# Patient Record
Sex: Male | Born: 1965 | Race: Black or African American | Hispanic: No | Marital: Single | Smoking: Current every day smoker
Health system: Southern US, Community
[De-identification: ages and names within clinical notes are randomized; demographics above are authoritative.]

## PROBLEM LIST (undated history)

## (undated) DIAGNOSIS — I428 Other cardiomyopathies: Secondary | ICD-10-CM

## (undated) DIAGNOSIS — I480 Paroxysmal atrial fibrillation: Secondary | ICD-10-CM

## (undated) DIAGNOSIS — G4733 Obstructive sleep apnea (adult) (pediatric): Secondary | ICD-10-CM

## (undated) DIAGNOSIS — M1990 Unspecified osteoarthritis, unspecified site: Secondary | ICD-10-CM

## (undated) DIAGNOSIS — N183 Chronic kidney disease, stage 3 unspecified: Secondary | ICD-10-CM

## (undated) DIAGNOSIS — K219 Gastro-esophageal reflux disease without esophagitis: Secondary | ICD-10-CM

## (undated) DIAGNOSIS — F17200 Nicotine dependence, unspecified, uncomplicated: Secondary | ICD-10-CM

## (undated) DIAGNOSIS — K259 Gastric ulcer, unspecified as acute or chronic, without hemorrhage or perforation: Secondary | ICD-10-CM

## (undated) DIAGNOSIS — R7989 Other specified abnormal findings of blood chemistry: Secondary | ICD-10-CM

## (undated) DIAGNOSIS — I4892 Unspecified atrial flutter: Secondary | ICD-10-CM

## (undated) DIAGNOSIS — J309 Allergic rhinitis, unspecified: Secondary | ICD-10-CM

## (undated) DIAGNOSIS — Z95810 Presence of automatic (implantable) cardiac defibrillator: Secondary | ICD-10-CM

## (undated) DIAGNOSIS — R300 Dysuria: Secondary | ICD-10-CM

## (undated) DIAGNOSIS — E119 Type 2 diabetes mellitus without complications: Secondary | ICD-10-CM

## (undated) DIAGNOSIS — G4700 Insomnia, unspecified: Secondary | ICD-10-CM

## (undated) DIAGNOSIS — I472 Ventricular tachycardia, unspecified: Secondary | ICD-10-CM

## (undated) DIAGNOSIS — I4729 Other ventricular tachycardia: Secondary | ICD-10-CM

## (undated) DIAGNOSIS — I10 Essential (primary) hypertension: Secondary | ICD-10-CM

## (undated) DIAGNOSIS — E785 Hyperlipidemia, unspecified: Secondary | ICD-10-CM

## (undated) DIAGNOSIS — I5042 Chronic combined systolic (congestive) and diastolic (congestive) heart failure: Secondary | ICD-10-CM

## (undated) HISTORY — DX: Allergic rhinitis, unspecified: J30.9

## (undated) HISTORY — DX: Essential (primary) hypertension: I10

## (undated) HISTORY — DX: Morbid (severe) obesity due to excess calories: E66.01

## (undated) HISTORY — DX: Gastric ulcer, unspecified as acute or chronic, without hemorrhage or perforation: K25.9

## (undated) HISTORY — DX: Gastro-esophageal reflux disease without esophagitis: K21.9

## (undated) HISTORY — DX: Unspecified osteoarthritis, unspecified site: M19.90

## (undated) HISTORY — DX: Insomnia, unspecified: G47.00

## (undated) HISTORY — DX: Nicotine dependence, unspecified, uncomplicated: F17.200

## (undated) HISTORY — DX: Hyperlipidemia, unspecified: E78.5

## (undated) HISTORY — DX: Obstructive sleep apnea (adult) (pediatric): G47.33

## (undated) HISTORY — DX: Other cardiomyopathies: I42.8

## (undated) HISTORY — DX: Other specified abnormal findings of blood chemistry: R79.89

## (undated) HISTORY — DX: Type 2 diabetes mellitus without complications: E11.9

## (undated) HISTORY — DX: Dysuria: R30.0

## (undated) HISTORY — DX: Chronic kidney disease, stage 3 unspecified: N18.30

## (undated) HISTORY — DX: Chronic kidney disease, stage 3 (moderate): N18.3

---

## 1999-03-19 ENCOUNTER — Emergency Department (HOSPITAL_COMMUNITY): Admission: EM | Admit: 1999-03-19 | Discharge: 1999-03-19 | Payer: Self-pay | Admitting: Emergency Medicine

## 1999-03-26 ENCOUNTER — Encounter: Admission: RE | Admit: 1999-03-26 | Discharge: 1999-03-26 | Payer: Self-pay | Admitting: Internal Medicine

## 1999-09-19 ENCOUNTER — Emergency Department (HOSPITAL_COMMUNITY): Admission: EM | Admit: 1999-09-19 | Discharge: 1999-09-19 | Payer: Self-pay | Admitting: *Deleted

## 1999-09-23 ENCOUNTER — Encounter: Admission: RE | Admit: 1999-09-23 | Discharge: 1999-09-23 | Payer: Self-pay | Admitting: Internal Medicine

## 1999-10-24 ENCOUNTER — Encounter: Admission: RE | Admit: 1999-10-24 | Discharge: 1999-10-24 | Payer: Self-pay | Admitting: Internal Medicine

## 2000-08-28 ENCOUNTER — Encounter: Admission: RE | Admit: 2000-08-28 | Discharge: 2000-08-28 | Payer: Self-pay | Admitting: Internal Medicine

## 2000-10-05 ENCOUNTER — Encounter: Admission: RE | Admit: 2000-10-05 | Discharge: 2000-10-05 | Payer: Self-pay | Admitting: Internal Medicine

## 2001-04-26 ENCOUNTER — Emergency Department (HOSPITAL_COMMUNITY): Admission: EM | Admit: 2001-04-26 | Discharge: 2001-04-26 | Payer: Self-pay | Admitting: Emergency Medicine

## 2001-07-05 ENCOUNTER — Encounter: Admission: RE | Admit: 2001-07-05 | Discharge: 2001-07-05 | Payer: Self-pay | Admitting: Internal Medicine

## 2002-02-16 ENCOUNTER — Encounter: Payer: Self-pay | Admitting: Emergency Medicine

## 2002-02-16 ENCOUNTER — Emergency Department (HOSPITAL_COMMUNITY): Admission: EM | Admit: 2002-02-16 | Discharge: 2002-02-16 | Payer: Self-pay | Admitting: *Deleted

## 2002-06-09 ENCOUNTER — Emergency Department (HOSPITAL_COMMUNITY): Admission: EM | Admit: 2002-06-09 | Discharge: 2002-06-09 | Payer: Self-pay | Admitting: Emergency Medicine

## 2002-06-13 ENCOUNTER — Encounter: Admission: RE | Admit: 2002-06-13 | Discharge: 2002-06-13 | Payer: Self-pay | Admitting: Internal Medicine

## 2002-06-14 ENCOUNTER — Encounter: Admission: RE | Admit: 2002-06-14 | Discharge: 2002-06-14 | Payer: Self-pay | Admitting: Internal Medicine

## 2002-06-21 ENCOUNTER — Encounter: Admission: RE | Admit: 2002-06-21 | Discharge: 2002-06-21 | Payer: Self-pay | Admitting: Internal Medicine

## 2002-07-05 ENCOUNTER — Encounter: Admission: RE | Admit: 2002-07-05 | Discharge: 2002-07-05 | Payer: Self-pay | Admitting: Internal Medicine

## 2002-08-05 ENCOUNTER — Encounter: Admission: RE | Admit: 2002-08-05 | Discharge: 2002-08-05 | Payer: Self-pay | Admitting: Internal Medicine

## 2002-08-10 ENCOUNTER — Encounter: Admission: RE | Admit: 2002-08-10 | Discharge: 2002-08-10 | Payer: Self-pay | Admitting: Internal Medicine

## 2002-08-16 ENCOUNTER — Encounter: Admission: RE | Admit: 2002-08-16 | Discharge: 2002-08-16 | Payer: Self-pay | Admitting: Internal Medicine

## 2002-08-30 ENCOUNTER — Encounter: Admission: RE | Admit: 2002-08-30 | Discharge: 2002-08-30 | Payer: Self-pay | Admitting: Internal Medicine

## 2002-11-15 ENCOUNTER — Emergency Department (HOSPITAL_COMMUNITY): Admission: EM | Admit: 2002-11-15 | Discharge: 2002-11-16 | Payer: Self-pay | Admitting: Emergency Medicine

## 2002-12-16 ENCOUNTER — Encounter: Admission: RE | Admit: 2002-12-16 | Discharge: 2002-12-16 | Payer: Self-pay | Admitting: Internal Medicine

## 2003-05-08 ENCOUNTER — Emergency Department (HOSPITAL_COMMUNITY): Admission: EM | Admit: 2003-05-08 | Discharge: 2003-05-08 | Payer: Self-pay | Admitting: Family Medicine

## 2003-05-10 ENCOUNTER — Encounter: Admission: RE | Admit: 2003-05-10 | Discharge: 2003-05-10 | Payer: Self-pay | Admitting: Internal Medicine

## 2003-05-26 ENCOUNTER — Emergency Department (HOSPITAL_COMMUNITY): Admission: EM | Admit: 2003-05-26 | Discharge: 2003-05-26 | Payer: Self-pay | Admitting: Emergency Medicine

## 2003-07-20 ENCOUNTER — Encounter: Admission: RE | Admit: 2003-07-20 | Discharge: 2003-07-20 | Payer: Self-pay | Admitting: Internal Medicine

## 2003-11-15 ENCOUNTER — Ambulatory Visit: Payer: Self-pay | Admitting: Internal Medicine

## 2003-12-06 ENCOUNTER — Ambulatory Visit: Payer: Self-pay | Admitting: Internal Medicine

## 2003-12-27 ENCOUNTER — Ambulatory Visit: Payer: Self-pay | Admitting: Internal Medicine

## 2004-01-10 ENCOUNTER — Ambulatory Visit: Payer: Self-pay | Admitting: Internal Medicine

## 2004-02-09 ENCOUNTER — Emergency Department (HOSPITAL_COMMUNITY): Admission: EM | Admit: 2004-02-09 | Discharge: 2004-02-09 | Payer: Self-pay | Admitting: Family Medicine

## 2004-02-15 ENCOUNTER — Emergency Department (HOSPITAL_COMMUNITY): Admission: EM | Admit: 2004-02-15 | Discharge: 2004-02-15 | Payer: Self-pay | Admitting: Family Medicine

## 2004-03-05 ENCOUNTER — Ambulatory Visit: Payer: Self-pay | Admitting: Internal Medicine

## 2004-03-19 ENCOUNTER — Ambulatory Visit: Payer: Self-pay | Admitting: Internal Medicine

## 2004-03-21 ENCOUNTER — Ambulatory Visit (HOSPITAL_COMMUNITY): Admission: RE | Admit: 2004-03-21 | Discharge: 2004-03-21 | Payer: Self-pay | Admitting: *Deleted

## 2004-03-21 ENCOUNTER — Ambulatory Visit: Payer: Self-pay | Admitting: Internal Medicine

## 2004-03-27 ENCOUNTER — Ambulatory Visit: Payer: Self-pay | Admitting: Internal Medicine

## 2004-03-27 ENCOUNTER — Inpatient Hospital Stay (HOSPITAL_COMMUNITY): Admission: EM | Admit: 2004-03-27 | Discharge: 2004-03-31 | Payer: Self-pay | Admitting: Family Medicine

## 2004-03-28 ENCOUNTER — Encounter: Payer: Self-pay | Admitting: Internal Medicine

## 2004-04-03 ENCOUNTER — Ambulatory Visit: Payer: Self-pay | Admitting: Internal Medicine

## 2004-04-05 ENCOUNTER — Ambulatory Visit (HOSPITAL_BASED_OUTPATIENT_CLINIC_OR_DEPARTMENT_OTHER): Admission: RE | Admit: 2004-04-05 | Discharge: 2004-04-05 | Payer: Self-pay | Admitting: *Deleted

## 2004-04-05 ENCOUNTER — Ambulatory Visit: Payer: Self-pay | Admitting: *Deleted

## 2004-04-16 ENCOUNTER — Ambulatory Visit: Payer: Self-pay | Admitting: Internal Medicine

## 2004-04-18 ENCOUNTER — Ambulatory Visit: Payer: Self-pay | Admitting: Internal Medicine

## 2004-04-29 ENCOUNTER — Ambulatory Visit: Payer: Self-pay | Admitting: Internal Medicine

## 2004-05-14 ENCOUNTER — Ambulatory Visit (HOSPITAL_BASED_OUTPATIENT_CLINIC_OR_DEPARTMENT_OTHER): Admission: RE | Admit: 2004-05-14 | Discharge: 2004-05-14 | Payer: Self-pay | Admitting: Internal Medicine

## 2004-05-17 ENCOUNTER — Ambulatory Visit: Payer: Self-pay | Admitting: Internal Medicine

## 2004-05-19 ENCOUNTER — Ambulatory Visit: Payer: Self-pay | Admitting: Internal Medicine

## 2004-05-30 ENCOUNTER — Ambulatory Visit: Payer: Self-pay | Admitting: Internal Medicine

## 2004-06-07 ENCOUNTER — Ambulatory Visit: Payer: Self-pay | Admitting: Internal Medicine

## 2004-06-25 ENCOUNTER — Ambulatory Visit: Payer: Self-pay

## 2004-06-27 ENCOUNTER — Ambulatory Visit: Payer: Self-pay | Admitting: Internal Medicine

## 2004-07-15 ENCOUNTER — Ambulatory Visit: Payer: Self-pay | Admitting: Internal Medicine

## 2004-08-01 ENCOUNTER — Ambulatory Visit: Payer: Self-pay | Admitting: Internal Medicine

## 2004-08-14 ENCOUNTER — Ambulatory Visit: Payer: Self-pay | Admitting: Internal Medicine

## 2004-08-30 ENCOUNTER — Ambulatory Visit: Payer: Self-pay | Admitting: Internal Medicine

## 2004-10-12 ENCOUNTER — Emergency Department (HOSPITAL_COMMUNITY): Admission: EM | Admit: 2004-10-12 | Discharge: 2004-10-13 | Payer: Self-pay | Admitting: Emergency Medicine

## 2004-10-17 ENCOUNTER — Ambulatory Visit: Payer: Self-pay | Admitting: Internal Medicine

## 2004-11-21 ENCOUNTER — Ambulatory Visit: Payer: Self-pay | Admitting: Internal Medicine

## 2004-11-28 ENCOUNTER — Ambulatory Visit: Payer: Self-pay | Admitting: Internal Medicine

## 2004-12-30 ENCOUNTER — Ambulatory Visit: Payer: Self-pay | Admitting: Internal Medicine

## 2005-01-07 ENCOUNTER — Ambulatory Visit: Payer: Self-pay | Admitting: Internal Medicine

## 2005-02-17 ENCOUNTER — Ambulatory Visit: Payer: Self-pay | Admitting: Internal Medicine

## 2005-03-31 ENCOUNTER — Ambulatory Visit: Payer: Self-pay | Admitting: Internal Medicine

## 2005-04-22 ENCOUNTER — Ambulatory Visit: Payer: Self-pay | Admitting: Pulmonary Disease

## 2005-05-22 ENCOUNTER — Ambulatory Visit: Payer: Self-pay | Admitting: Internal Medicine

## 2005-05-30 ENCOUNTER — Ambulatory Visit: Payer: Self-pay | Admitting: Internal Medicine

## 2005-06-06 ENCOUNTER — Ambulatory Visit: Payer: Self-pay | Admitting: Pulmonary Disease

## 2005-07-03 ENCOUNTER — Ambulatory Visit: Payer: Self-pay | Admitting: Internal Medicine

## 2005-07-29 ENCOUNTER — Ambulatory Visit: Payer: Self-pay | Admitting: Internal Medicine

## 2005-08-26 ENCOUNTER — Ambulatory Visit: Payer: Self-pay | Admitting: Hospitalist

## 2005-09-02 ENCOUNTER — Ambulatory Visit: Payer: Self-pay | Admitting: Internal Medicine

## 2005-09-05 ENCOUNTER — Emergency Department (HOSPITAL_COMMUNITY): Admission: EM | Admit: 2005-09-05 | Discharge: 2005-09-05 | Payer: Self-pay | Admitting: Emergency Medicine

## 2005-10-09 ENCOUNTER — Ambulatory Visit: Payer: Self-pay | Admitting: Internal Medicine

## 2005-10-16 ENCOUNTER — Ambulatory Visit: Payer: Self-pay | Admitting: Internal Medicine

## 2005-11-14 ENCOUNTER — Emergency Department (HOSPITAL_COMMUNITY): Admission: EM | Admit: 2005-11-14 | Discharge: 2005-11-14 | Payer: Self-pay | Admitting: Family Medicine

## 2005-11-18 ENCOUNTER — Emergency Department (HOSPITAL_COMMUNITY): Admission: EM | Admit: 2005-11-18 | Discharge: 2005-11-18 | Payer: Self-pay | Admitting: Emergency Medicine

## 2005-11-25 ENCOUNTER — Ambulatory Visit: Payer: Self-pay | Admitting: Internal Medicine

## 2005-12-08 ENCOUNTER — Ambulatory Visit: Payer: Self-pay | Admitting: Internal Medicine

## 2005-12-12 ENCOUNTER — Ambulatory Visit (HOSPITAL_COMMUNITY): Admission: RE | Admit: 2005-12-12 | Discharge: 2005-12-12 | Payer: Self-pay | Admitting: Internal Medicine

## 2005-12-12 ENCOUNTER — Encounter (INDEPENDENT_AMBULATORY_CARE_PROVIDER_SITE_OTHER): Payer: Self-pay | Admitting: Internal Medicine

## 2005-12-12 ENCOUNTER — Ambulatory Visit: Payer: Self-pay | Admitting: Internal Medicine

## 2006-01-30 ENCOUNTER — Ambulatory Visit: Payer: Self-pay | Admitting: Internal Medicine

## 2006-02-24 ENCOUNTER — Telehealth (INDEPENDENT_AMBULATORY_CARE_PROVIDER_SITE_OTHER): Payer: Self-pay | Admitting: Internal Medicine

## 2006-02-25 ENCOUNTER — Telehealth: Payer: Self-pay | Admitting: *Deleted

## 2006-02-26 ENCOUNTER — Telehealth: Payer: Self-pay | Admitting: *Deleted

## 2006-03-17 ENCOUNTER — Encounter (INDEPENDENT_AMBULATORY_CARE_PROVIDER_SITE_OTHER): Payer: Self-pay | Admitting: Internal Medicine

## 2006-03-17 ENCOUNTER — Ambulatory Visit: Payer: Self-pay | Admitting: Hospitalist

## 2006-03-17 DIAGNOSIS — I10 Essential (primary) hypertension: Secondary | ICD-10-CM | POA: Insufficient documentation

## 2006-03-17 DIAGNOSIS — K219 Gastro-esophageal reflux disease without esophagitis: Secondary | ICD-10-CM | POA: Insufficient documentation

## 2006-03-17 DIAGNOSIS — F17200 Nicotine dependence, unspecified, uncomplicated: Secondary | ICD-10-CM

## 2006-03-17 DIAGNOSIS — I428 Other cardiomyopathies: Secondary | ICD-10-CM

## 2006-03-17 DIAGNOSIS — R300 Dysuria: Secondary | ICD-10-CM

## 2006-03-17 DIAGNOSIS — E1129 Type 2 diabetes mellitus with other diabetic kidney complication: Secondary | ICD-10-CM

## 2006-03-17 DIAGNOSIS — E1165 Type 2 diabetes mellitus with hyperglycemia: Secondary | ICD-10-CM

## 2006-03-17 LAB — CONVERTED CEMR LAB
Amphetamine Screen, Ur: NEGATIVE
BUN: 7 mg/dL (ref 6–23)
Barbiturate Quant, Ur: NEGATIVE
Basophils Absolute: 0.1 10*3/uL (ref 0.0–0.1)
Basophils Relative: 1 % (ref 0–1)
Benzodiazepines.: NEGATIVE
Bilirubin Urine: NEGATIVE
CO2: 26 meq/L (ref 19–32)
Calcium: 9.5 mg/dL (ref 8.4–10.5)
Chloride: 103 meq/L (ref 96–112)
Cocaine Metabolites: NEGATIVE
Creatinine, Ser: 0.76 mg/dL (ref 0.40–1.50)
Creatinine, Urine: 91.4 mg/dL
Creatinine,U: 97.4 mg/dL
Eosinophils Absolute: 0.2 10*3/uL (ref 0.0–0.7)
Eosinophils Relative: 4 % (ref 0–5)
Glucose, Bld: 403 mg/dL
Glucose, Bld: 335 mg/dL — ABNORMAL HIGH (ref 70–99)
HCT: 45.2 % (ref 39.0–52.0)
Hemoglobin, Urine: NEGATIVE
Hemoglobin: 15.1 g/dL (ref 13.0–17.0)
Hgb A1c MFr Bld: 11 %
Ketones, ur: NEGATIVE mg/dL
Leukocytes, UA: NEGATIVE
Lymphocytes Relative: 35 % (ref 12–46)
Lymphs Abs: 2.3 10*3/uL (ref 0.7–3.3)
MCHC: 33.5 g/dL (ref 30.0–36.0)
MCV: 87.1 fL (ref 78.0–100.0)
Marijuana Metabolite: NEGATIVE
Methadone: NEGATIVE
Microalb Creat Ratio: 157.5 mg/g — ABNORMAL HIGH (ref 0.0–30.0)
Microalb, Ur: 14.4 mg/dL — ABNORMAL HIGH (ref 0.00–1.89)
Monocytes Absolute: 0.5 10*3/uL (ref 0.2–0.7)
Monocytes Relative: 8 % (ref 3–11)
Neutro Abs: 3.4 10*3/uL (ref 1.7–7.7)
Neutrophils Relative %: 53 % (ref 43–77)
Nitrite: NEGATIVE
Opiates: NEGATIVE
Phencyclidine (PCP): NEGATIVE
Platelets: 202 10*3/uL (ref 150–400)
Potassium: 4.3 meq/L (ref 3.5–5.3)
Propoxyphene: NEGATIVE
Protein, ur: 30 mg/dL — AB
RBC: 5.19 M/uL (ref 4.22–5.81)
RDW: 12.3 % (ref 11.5–14.0)
Sodium: 137 meq/L (ref 135–145)
Specific Gravity, Urine: 1.03 (ref 1.005–1.03)
Urine Glucose: 1000 mg/dL — AB
Urobilinogen, UA: 2 — ABNORMAL HIGH (ref 0.0–1.0)
WBC: 6.5 10*3/uL (ref 4.0–10.5)
pH: 6 (ref 5.0–8.0)

## 2006-03-30 ENCOUNTER — Ambulatory Visit: Payer: Self-pay | Admitting: Internal Medicine

## 2006-03-30 DIAGNOSIS — N181 Chronic kidney disease, stage 1: Secondary | ICD-10-CM

## 2006-03-30 LAB — CONVERTED CEMR LAB: Blood Glucose, Fingerstick: 109

## 2006-03-31 ENCOUNTER — Telehealth (INDEPENDENT_AMBULATORY_CARE_PROVIDER_SITE_OTHER): Payer: Self-pay | Admitting: *Deleted

## 2006-04-07 ENCOUNTER — Encounter (INDEPENDENT_AMBULATORY_CARE_PROVIDER_SITE_OTHER): Payer: Self-pay | Admitting: Internal Medicine

## 2006-04-07 ENCOUNTER — Telehealth: Payer: Self-pay | Admitting: *Deleted

## 2006-04-07 ENCOUNTER — Ambulatory Visit: Payer: Self-pay | Admitting: Internal Medicine

## 2006-04-07 LAB — CONVERTED CEMR LAB
BUN: 9 mg/dL (ref 6–23)
CO2: 21 meq/L (ref 19–32)
Calcium: 9 mg/dL (ref 8.4–10.5)
Chloride: 107 meq/L (ref 96–112)
Creatinine, Ser: 0.85 mg/dL (ref 0.40–1.50)
Glucose, Bld: 193 mg/dL — ABNORMAL HIGH (ref 70–99)
Potassium: 3.9 meq/L (ref 3.5–5.3)
Sodium: 141 meq/L (ref 135–145)

## 2006-04-09 ENCOUNTER — Telehealth: Payer: Self-pay | Admitting: *Deleted

## 2006-04-13 ENCOUNTER — Encounter (INDEPENDENT_AMBULATORY_CARE_PROVIDER_SITE_OTHER): Payer: Self-pay | Admitting: *Deleted

## 2006-05-07 ENCOUNTER — Telehealth: Payer: Self-pay | Admitting: *Deleted

## 2006-05-14 ENCOUNTER — Ambulatory Visit: Payer: Self-pay | Admitting: Internal Medicine

## 2006-06-01 ENCOUNTER — Telehealth (INDEPENDENT_AMBULATORY_CARE_PROVIDER_SITE_OTHER): Payer: Self-pay | Admitting: Internal Medicine

## 2006-06-12 ENCOUNTER — Encounter (INDEPENDENT_AMBULATORY_CARE_PROVIDER_SITE_OTHER): Payer: Self-pay | Admitting: Internal Medicine

## 2006-07-27 ENCOUNTER — Ambulatory Visit: Payer: Self-pay | Admitting: Internal Medicine

## 2006-07-27 LAB — CONVERTED CEMR LAB
Blood Glucose, Fingerstick: 357
Hgb A1c MFr Bld: 9.5 %

## 2006-07-29 ENCOUNTER — Ambulatory Visit: Payer: Self-pay | Admitting: Internal Medicine

## 2006-07-29 ENCOUNTER — Encounter (INDEPENDENT_AMBULATORY_CARE_PROVIDER_SITE_OTHER): Payer: Self-pay | Admitting: *Deleted

## 2006-08-22 LAB — CONVERTED CEMR LAB
ALT: 9 units/L (ref 0–53)
AST: 11 units/L (ref 0–37)
Albumin: 4.3 g/dL (ref 3.5–5.2)
Alkaline Phosphatase: 61 units/L (ref 39–117)
BUN: 12 mg/dL (ref 6–23)
CO2: 23 meq/L (ref 19–32)
Calcium: 9.6 mg/dL (ref 8.4–10.5)
Chloride: 106 meq/L (ref 96–112)
Cholesterol: 121 mg/dL (ref 0–200)
Creatinine, Ser: 0.92 mg/dL (ref 0.40–1.50)
Creatinine, Urine: 252 mg/dL
Glucose, Bld: 296 mg/dL — ABNORMAL HIGH (ref 70–99)
HDL: 36 mg/dL — ABNORMAL LOW (ref >39)
LDL Cholesterol: 73 mg/dL (ref 0–99)
Microalb Creat Ratio: 77.8 mg/g — ABNORMAL HIGH (ref 0.0–30.0)
Microalb, Ur: 19.6 mg/dL — ABNORMAL HIGH (ref 0.00–1.89)
Potassium: 4.3 meq/L (ref 3.5–5.3)
Sodium: 138 meq/L (ref 135–145)
Total Bilirubin: 1 mg/dL (ref 0.3–1.2)
Total CHOL/HDL Ratio: 3.4
Total Protein: 7.5 g/dL (ref 6.0–8.3)
Triglycerides: 58 mg/dL (ref <150)
VLDL: 12 mg/dL (ref 0–40)

## 2006-08-31 ENCOUNTER — Ambulatory Visit: Payer: Self-pay | Admitting: Internal Medicine

## 2006-09-09 ENCOUNTER — Ambulatory Visit: Payer: Self-pay | Admitting: Internal Medicine

## 2006-09-09 ENCOUNTER — Ambulatory Visit: Payer: Self-pay

## 2006-09-09 LAB — CONVERTED CEMR LAB
ALT: 14 units/L (ref 0–53)
AST: 16 units/L (ref 0–37)
Albumin: 3.5 g/dL (ref 3.5–5.2)
Alkaline Phosphatase: 64 units/L (ref 39–117)
BUN: 6 mg/dL (ref 6–23)
Bilirubin, Direct: 0.2 mg/dL (ref 0.0–0.3)
CO2: 29 meq/L (ref 19–32)
Calcium: 9.6 mg/dL (ref 8.4–10.5)
Chloride: 108 meq/L (ref 96–112)
Cholesterol: 129 mg/dL (ref 0–200)
Creatinine, Ser: 0.9 mg/dL (ref 0.4–1.5)
GFR calc Af Amer: 119 mL/min
GFR calc non Af Amer: 98 mL/min
Glucose, Bld: 247 mg/dL — ABNORMAL HIGH (ref 70–99)
HDL: 31.4 mg/dL — ABNORMAL LOW (ref >39.0)
LDL Cholesterol: 85 mg/dL (ref 0–99)
Potassium: 4.5 meq/L (ref 3.5–5.1)
Sodium: 143 meq/L (ref 135–145)
Total Bilirubin: 1.1 mg/dL (ref 0.3–1.2)
Total CHOL/HDL Ratio: 4.1
Total Protein: 7.4 g/dL (ref 6.0–8.3)
Triglycerides: 64 mg/dL (ref 0–149)
VLDL: 13 mg/dL (ref 0–40)

## 2006-10-08 ENCOUNTER — Encounter (INDEPENDENT_AMBULATORY_CARE_PROVIDER_SITE_OTHER): Payer: Self-pay | Admitting: *Deleted

## 2006-11-09 ENCOUNTER — Telehealth: Payer: Self-pay | Admitting: *Deleted

## 2006-11-13 ENCOUNTER — Telehealth: Payer: Self-pay | Admitting: *Deleted

## 2006-12-14 ENCOUNTER — Telehealth: Payer: Self-pay | Admitting: *Deleted

## 2006-12-14 ENCOUNTER — Ambulatory Visit: Payer: Self-pay | Admitting: Internal Medicine

## 2006-12-14 ENCOUNTER — Encounter (INDEPENDENT_AMBULATORY_CARE_PROVIDER_SITE_OTHER): Payer: Self-pay | Admitting: *Deleted

## 2006-12-14 DIAGNOSIS — R1012 Left upper quadrant pain: Secondary | ICD-10-CM

## 2006-12-14 LAB — CONVERTED CEMR LAB
Blood Glucose, Fingerstick: 133
Hgb A1c MFr Bld: 5.8 %

## 2006-12-18 ENCOUNTER — Telehealth (INDEPENDENT_AMBULATORY_CARE_PROVIDER_SITE_OTHER): Payer: Self-pay | Admitting: *Deleted

## 2006-12-30 ENCOUNTER — Emergency Department (HOSPITAL_COMMUNITY): Admission: EM | Admit: 2006-12-30 | Discharge: 2006-12-30 | Payer: Self-pay | Admitting: Emergency Medicine

## 2007-01-07 ENCOUNTER — Ambulatory Visit: Payer: Self-pay | Admitting: Internal Medicine

## 2007-01-07 LAB — CONVERTED CEMR LAB
BUN: 14 mg/dL (ref 6–23)
CO2: 23 meq/L (ref 19–32)
Calcium: 9 mg/dL (ref 8.4–10.5)
Chloride: 111 meq/L (ref 96–112)
Creatinine, Ser: 1.4 mg/dL (ref 0.4–1.5)
GFR calc Af Amer: 71 mL/min
GFR calc non Af Amer: 59 mL/min
Glucose, Bld: 162 mg/dL — ABNORMAL HIGH (ref 70–99)
Potassium: 3.6 meq/L (ref 3.5–5.1)
Pro B Natriuretic peptide (BNP): 819 pg/mL — ABNORMAL HIGH (ref 0.0–100.0)
Sodium: 142 meq/L (ref 135–145)

## 2007-01-14 ENCOUNTER — Ambulatory Visit: Payer: Self-pay | Admitting: Internal Medicine

## 2007-01-14 LAB — CONVERTED CEMR LAB
BUN: 9 mg/dL (ref 6–23)
CO2: 27 meq/L (ref 19–32)
Calcium: 9.4 mg/dL (ref 8.4–10.5)
Chloride: 108 meq/L (ref 96–112)
Creatinine, Ser: 1.3 mg/dL (ref 0.4–1.5)
GFR calc Af Amer: 78 mL/min
GFR calc non Af Amer: 64 mL/min
Glucose, Bld: 173 mg/dL — ABNORMAL HIGH (ref 70–99)
Potassium: 3.9 meq/L (ref 3.5–5.1)
Pro B Natriuretic peptide (BNP): 675 pg/mL — ABNORMAL HIGH (ref 0.0–100.0)
Sodium: 143 meq/L (ref 135–145)

## 2007-01-25 ENCOUNTER — Telehealth: Payer: Self-pay | Admitting: *Deleted

## 2007-02-01 ENCOUNTER — Ambulatory Visit: Payer: Self-pay | Admitting: Internal Medicine

## 2007-02-01 LAB — CONVERTED CEMR LAB
BUN: 11 mg/dL (ref 6–23)
CO2: 27 meq/L (ref 19–32)
Calcium: 9.6 mg/dL (ref 8.4–10.5)
Chloride: 109 meq/L (ref 96–112)
Creatinine, Ser: 1.2 mg/dL (ref 0.4–1.5)
GFR calc Af Amer: 85 mL/min
GFR calc non Af Amer: 71 mL/min
Glucose, Bld: 130 mg/dL — ABNORMAL HIGH (ref 70–99)
Potassium: 3.8 meq/L (ref 3.5–5.1)
Pro B Natriuretic peptide (BNP): 540 pg/mL — ABNORMAL HIGH (ref 0.0–100.0)
Sodium: 144 meq/L (ref 135–145)

## 2007-02-09 DIAGNOSIS — G4733 Obstructive sleep apnea (adult) (pediatric): Secondary | ICD-10-CM

## 2007-02-12 ENCOUNTER — Telehealth: Payer: Self-pay | Admitting: *Deleted

## 2007-03-08 ENCOUNTER — Ambulatory Visit: Payer: Self-pay | Admitting: Pulmonary Disease

## 2007-03-08 DIAGNOSIS — G4700 Insomnia, unspecified: Secondary | ICD-10-CM

## 2007-03-23 ENCOUNTER — Telehealth: Payer: Self-pay | Admitting: *Deleted

## 2007-03-31 ENCOUNTER — Encounter (INDEPENDENT_AMBULATORY_CARE_PROVIDER_SITE_OTHER): Payer: Self-pay | Admitting: *Deleted

## 2007-03-31 ENCOUNTER — Ambulatory Visit: Payer: Self-pay | Admitting: Hospitalist

## 2007-03-31 LAB — CONVERTED CEMR LAB
Blood Glucose, Fingerstick: 207
Hgb A1c MFr Bld: 7.4 %

## 2007-04-01 ENCOUNTER — Telehealth (INDEPENDENT_AMBULATORY_CARE_PROVIDER_SITE_OTHER): Payer: Self-pay | Admitting: *Deleted

## 2007-04-01 ENCOUNTER — Ambulatory Visit: Payer: Self-pay | Admitting: Internal Medicine

## 2007-04-13 ENCOUNTER — Ambulatory Visit: Payer: Self-pay | Admitting: Internal Medicine

## 2007-04-13 ENCOUNTER — Encounter (INDEPENDENT_AMBULATORY_CARE_PROVIDER_SITE_OTHER): Payer: Self-pay | Admitting: *Deleted

## 2007-04-13 LAB — CONVERTED CEMR LAB
ALT: 12 units/L (ref 0–53)
AST: 15 units/L (ref 0–37)
Cholesterol: 110 mg/dL (ref 0–200)
HDL: 24.7 mg/dL — ABNORMAL LOW (ref >39.0)
LDL Cholesterol: 74 mg/dL (ref 0–99)
Total CHOL/HDL Ratio: 4.5
Triglycerides: 55 mg/dL (ref 0–149)
VLDL: 11 mg/dL (ref 0–40)

## 2007-05-27 ENCOUNTER — Ambulatory Visit: Payer: Self-pay | Admitting: Internal Medicine

## 2007-06-15 ENCOUNTER — Encounter: Payer: Self-pay | Admitting: Pulmonary Disease

## 2007-06-16 ENCOUNTER — Telehealth (INDEPENDENT_AMBULATORY_CARE_PROVIDER_SITE_OTHER): Payer: Self-pay | Admitting: *Deleted

## 2007-07-05 ENCOUNTER — Ambulatory Visit: Payer: Self-pay | Admitting: Internal Medicine

## 2007-07-05 ENCOUNTER — Encounter (INDEPENDENT_AMBULATORY_CARE_PROVIDER_SITE_OTHER): Payer: Self-pay | Admitting: *Deleted

## 2007-07-05 LAB — CONVERTED CEMR LAB
Blood Glucose, Fingerstick: 425
Hgb A1c MFr Bld: 9.4 %

## 2007-07-06 LAB — CONVERTED CEMR LAB
Creatinine, Urine: 71.8 mg/dL
Microalb Creat Ratio: 110.9 mg/g — ABNORMAL HIGH (ref 0.0–30.0)
Microalb, Ur: 7.96 mg/dL — ABNORMAL HIGH (ref 0.00–1.89)

## 2007-07-20 ENCOUNTER — Ambulatory Visit: Payer: Self-pay | Admitting: Internal Medicine

## 2007-07-20 LAB — CONVERTED CEMR LAB: Blood Glucose, Fingerstick: 325

## 2007-08-11 ENCOUNTER — Encounter (INDEPENDENT_AMBULATORY_CARE_PROVIDER_SITE_OTHER): Payer: Self-pay | Admitting: *Deleted

## 2007-08-11 ENCOUNTER — Encounter: Payer: Self-pay | Admitting: Internal Medicine

## 2007-08-11 ENCOUNTER — Ambulatory Visit: Payer: Self-pay | Admitting: Infectious Diseases

## 2007-08-11 LAB — CONVERTED CEMR LAB: Blood Glucose, Fingerstick: 331

## 2007-08-12 DIAGNOSIS — E785 Hyperlipidemia, unspecified: Secondary | ICD-10-CM

## 2007-08-12 LAB — CONVERTED CEMR LAB
BUN: 10 mg/dL (ref 6–23)
CO2: 21 meq/L (ref 19–32)
Calcium: 10 mg/dL (ref 8.4–10.5)
Chloride: 105 meq/L (ref 96–112)
Cholesterol: 133 mg/dL (ref 0–200)
Creatinine, Ser: 0.95 mg/dL (ref 0.40–1.50)
Glucose, Bld: 299 mg/dL — ABNORMAL HIGH (ref 70–99)
HDL: 35 mg/dL — ABNORMAL LOW (ref >39)
LDL Cholesterol: 79 mg/dL (ref 0–99)
Potassium: 4.4 meq/L (ref 3.5–5.3)
Sodium: 141 meq/L (ref 135–145)
Total CHOL/HDL Ratio: 3.8
Triglycerides: 95 mg/dL (ref <150)
VLDL: 19 mg/dL (ref 0–40)

## 2007-09-28 ENCOUNTER — Telehealth: Payer: Self-pay | Admitting: Internal Medicine

## 2007-10-28 ENCOUNTER — Ambulatory Visit: Payer: Self-pay | Admitting: Internal Medicine

## 2007-10-28 LAB — CONVERTED CEMR LAB
Calcium: 9.1 mg/dL (ref 8.4–10.5)
Creatinine, Ser: 1.2 mg/dL (ref 0.4–1.5)
GFR calc Af Amer: 85 mL/min
Pro B Natriuretic peptide (BNP): 767 pg/mL — ABNORMAL HIGH (ref 0.0–100.0)
Sodium: 140 meq/L (ref 135–145)

## 2007-11-15 ENCOUNTER — Ambulatory Visit: Payer: Self-pay | Admitting: Internal Medicine

## 2007-11-15 LAB — CONVERTED CEMR LAB
CO2: 26 meq/L (ref 19–32)
Calcium: 9.2 mg/dL (ref 8.4–10.5)
Creatinine, Ser: 1.2 mg/dL (ref 0.4–1.5)
GFR calc Af Amer: 85 mL/min
Glucose, Bld: 137 mg/dL — ABNORMAL HIGH (ref 70–99)
Pro B Natriuretic peptide (BNP): 445 pg/mL — ABNORMAL HIGH (ref 0.0–100.0)
Sodium: 143 meq/L (ref 135–145)

## 2007-11-26 ENCOUNTER — Ambulatory Visit: Payer: Self-pay | Admitting: Internal Medicine

## 2007-11-26 LAB — CONVERTED CEMR LAB
CO2: 25 meq/L (ref 19–32)
Calcium: 9.3 mg/dL (ref 8.4–10.5)
Creatinine, Ser: 1.1 mg/dL (ref 0.4–1.5)
GFR calc Af Amer: 94 mL/min
Glucose, Bld: 113 mg/dL — ABNORMAL HIGH (ref 70–99)
Sodium: 143 meq/L (ref 135–145)

## 2007-12-29 ENCOUNTER — Encounter: Payer: Self-pay | Admitting: Internal Medicine

## 2008-01-17 ENCOUNTER — Telehealth: Payer: Self-pay | Admitting: Internal Medicine

## 2008-01-19 ENCOUNTER — Ambulatory Visit: Payer: Self-pay | Admitting: Cardiovascular Disease

## 2008-01-31 ENCOUNTER — Telehealth (INDEPENDENT_AMBULATORY_CARE_PROVIDER_SITE_OTHER): Payer: Self-pay | Admitting: *Deleted

## 2008-02-07 ENCOUNTER — Telehealth: Payer: Self-pay | Admitting: Internal Medicine

## 2008-02-18 ENCOUNTER — Ambulatory Visit: Payer: Self-pay | Admitting: Internal Medicine

## 2008-02-18 LAB — CONVERTED CEMR LAB
CO2: 28 meq/L (ref 19–32)
Calcium: 9.8 mg/dL (ref 8.4–10.5)
Creatinine, Ser: 1.1 mg/dL (ref 0.4–1.5)
GFR calc Af Amer: 94 mL/min
Glucose, Bld: 71 mg/dL (ref 70–99)
Sodium: 142 meq/L (ref 135–145)

## 2008-04-06 ENCOUNTER — Ambulatory Visit: Payer: Self-pay | Admitting: Internal Medicine

## 2008-04-06 LAB — CONVERTED CEMR LAB
BUN: 16 mg/dL (ref 6–23)
Calcium: 9.1 mg/dL (ref 8.4–10.5)
Cholesterol: 96 mg/dL (ref 0–200)
Creatinine, Ser: 1 mg/dL (ref 0.4–1.5)
GFR calc Af Amer: 105 mL/min
GFR calc non Af Amer: 87 mL/min
HDL: 16.9 mg/dL — ABNORMAL LOW (ref >39.0)
Hgb A1c MFr Bld: 5.9 % (ref 4.6–6.0)
LDL Cholesterol: 68 mg/dL (ref 0–99)
TSH: 1.8 microintl units/mL (ref 0.35–5.50)
Total CHOL/HDL Ratio: 5.7
Triglycerides: 56 mg/dL (ref 0–149)
VLDL: 11 mg/dL (ref 0–40)

## 2008-04-10 ENCOUNTER — Telehealth: Payer: Self-pay | Admitting: Internal Medicine

## 2008-04-25 ENCOUNTER — Telehealth: Payer: Self-pay | Admitting: Internal Medicine

## 2008-05-01 ENCOUNTER — Encounter: Payer: Self-pay | Admitting: Internal Medicine

## 2008-05-01 ENCOUNTER — Ambulatory Visit: Payer: Self-pay | Admitting: Internal Medicine

## 2008-05-01 LAB — CONVERTED CEMR LAB
BUN: 16 mg/dL (ref 6–23)
CO2: 24 meq/L (ref 19–32)
Calcium: 9.4 mg/dL (ref 8.4–10.5)
Chloride: 109 meq/L (ref 96–112)
Creatinine, Ser: 1 mg/dL (ref 0.4–1.5)
Glucose, Bld: 160 mg/dL — ABNORMAL HIGH (ref 70–99)

## 2008-06-16 ENCOUNTER — Telehealth: Payer: Self-pay | Admitting: Internal Medicine

## 2008-06-20 ENCOUNTER — Ambulatory Visit: Payer: Self-pay | Admitting: Internal Medicine

## 2008-06-22 LAB — CONVERTED CEMR LAB
BUN: 13 mg/dL (ref 6–23)
CO2: 28 meq/L (ref 19–32)
Calcium: 9.1 mg/dL (ref 8.4–10.5)
Glucose, Bld: 122 mg/dL — ABNORMAL HIGH (ref 70–99)
Sodium: 146 meq/L — ABNORMAL HIGH (ref 135–145)

## 2008-07-10 ENCOUNTER — Telehealth: Payer: Self-pay | Admitting: Internal Medicine

## 2008-07-20 ENCOUNTER — Ambulatory Visit: Payer: Self-pay | Admitting: Internal Medicine

## 2008-07-24 ENCOUNTER — Telehealth: Payer: Self-pay | Admitting: Internal Medicine

## 2008-07-28 ENCOUNTER — Telehealth: Payer: Self-pay | Admitting: Internal Medicine

## 2008-08-04 ENCOUNTER — Encounter: Payer: Self-pay | Admitting: Internal Medicine

## 2008-08-07 ENCOUNTER — Telehealth: Payer: Self-pay | Admitting: Internal Medicine

## 2008-09-01 ENCOUNTER — Ambulatory Visit: Payer: Self-pay | Admitting: Internal Medicine

## 2008-09-06 LAB — CONVERTED CEMR LAB
BUN: 17 mg/dL (ref 6–23)
Basophils Absolute: 0 10*3/uL (ref 0.0–0.1)
Basophils Relative: 0 % (ref 0.0–3.0)
Creatinine, Ser: 1.1 mg/dL (ref 0.4–1.5)
Eosinophils Absolute: 0.2 10*3/uL (ref 0.0–0.7)
GFR calc non Af Amer: 93.5 mL/min (ref >60)
Hemoglobin: 13.5 g/dL (ref 13.0–17.0)
MCHC: 33.8 g/dL (ref 30.0–36.0)
MCV: 96.2 fL (ref 78.0–100.0)
Monocytes Absolute: 0.6 10*3/uL (ref 0.1–1.0)
Neutro Abs: 4.3 10*3/uL (ref 1.4–7.7)
Neutrophils Relative %: 62.7 % (ref 43.0–77.0)
Prothrombin Time: 13 s — ABNORMAL HIGH (ref 9.1–11.7)
RBC: 4.14 M/uL — ABNORMAL LOW (ref 4.22–5.81)
RDW: 12.6 % (ref 11.5–14.6)

## 2008-09-08 ENCOUNTER — Ambulatory Visit: Payer: Self-pay | Admitting: Internal Medicine

## 2008-09-08 ENCOUNTER — Ambulatory Visit (HOSPITAL_COMMUNITY): Admission: RE | Admit: 2008-09-08 | Discharge: 2008-09-09 | Payer: Self-pay | Admitting: Internal Medicine

## 2008-09-09 ENCOUNTER — Encounter: Payer: Self-pay | Admitting: Internal Medicine

## 2008-09-11 ENCOUNTER — Encounter: Payer: Self-pay | Admitting: Internal Medicine

## 2008-09-28 ENCOUNTER — Encounter: Payer: Self-pay | Admitting: Internal Medicine

## 2008-09-28 ENCOUNTER — Ambulatory Visit: Payer: Self-pay

## 2008-10-03 ENCOUNTER — Telehealth: Payer: Self-pay | Admitting: Internal Medicine

## 2008-10-20 ENCOUNTER — Telehealth: Payer: Self-pay | Admitting: Internal Medicine

## 2008-10-23 ENCOUNTER — Telehealth: Payer: Self-pay | Admitting: Internal Medicine

## 2008-10-30 ENCOUNTER — Telehealth: Payer: Self-pay | Admitting: Internal Medicine

## 2008-11-06 ENCOUNTER — Ambulatory Visit: Payer: Self-pay | Admitting: Internal Medicine

## 2008-11-13 ENCOUNTER — Telehealth: Payer: Self-pay | Admitting: Internal Medicine

## 2008-11-29 ENCOUNTER — Telehealth: Payer: Self-pay | Admitting: Internal Medicine

## 2008-12-06 ENCOUNTER — Telehealth: Payer: Self-pay | Admitting: Internal Medicine

## 2008-12-12 ENCOUNTER — Telehealth: Payer: Self-pay | Admitting: Internal Medicine

## 2008-12-13 ENCOUNTER — Telehealth: Payer: Self-pay | Admitting: Internal Medicine

## 2008-12-15 ENCOUNTER — Encounter (INDEPENDENT_AMBULATORY_CARE_PROVIDER_SITE_OTHER): Payer: Self-pay | Admitting: *Deleted

## 2009-01-01 ENCOUNTER — Ambulatory Visit: Payer: Self-pay | Admitting: Internal Medicine

## 2009-01-01 DIAGNOSIS — F411 Generalized anxiety disorder: Secondary | ICD-10-CM | POA: Insufficient documentation

## 2009-01-15 ENCOUNTER — Telehealth: Payer: Self-pay | Admitting: Internal Medicine

## 2009-01-18 ENCOUNTER — Telehealth: Payer: Self-pay | Admitting: Internal Medicine

## 2009-01-18 LAB — CONVERTED CEMR LAB
ALT: 10 units/L (ref 0–53)
AST: 18 units/L (ref 0–37)
Albumin: 4 g/dL (ref 3.5–5.2)
Alkaline Phosphatase: 109 units/L (ref 39–117)
Calcium: 9.5 mg/dL (ref 8.4–10.5)

## 2009-01-27 HISTORY — PX: CARDIAC DEFIBRILLATOR PLACEMENT: SHX171

## 2009-02-09 ENCOUNTER — Telehealth: Payer: Self-pay | Admitting: Internal Medicine

## 2009-02-22 ENCOUNTER — Telehealth: Payer: Self-pay | Admitting: Internal Medicine

## 2009-02-27 ENCOUNTER — Ambulatory Visit: Payer: Self-pay | Admitting: Internal Medicine

## 2009-02-27 DIAGNOSIS — R42 Dizziness and giddiness: Secondary | ICD-10-CM | POA: Insufficient documentation

## 2009-03-05 ENCOUNTER — Telehealth: Payer: Self-pay | Admitting: Internal Medicine

## 2009-03-06 ENCOUNTER — Telehealth: Payer: Self-pay | Admitting: Internal Medicine

## 2009-03-07 ENCOUNTER — Telehealth: Payer: Self-pay | Admitting: *Deleted

## 2009-03-08 ENCOUNTER — Telehealth: Payer: Self-pay | Admitting: Internal Medicine

## 2009-03-19 ENCOUNTER — Telehealth: Payer: Self-pay | Admitting: Internal Medicine

## 2009-03-20 ENCOUNTER — Ambulatory Visit: Payer: Self-pay | Admitting: Internal Medicine

## 2009-03-20 DIAGNOSIS — I5022 Chronic systolic (congestive) heart failure: Secondary | ICD-10-CM

## 2009-03-29 LAB — CONVERTED CEMR LAB
BUN: 24 mg/dL — ABNORMAL HIGH (ref 6–23)
Chloride: 106 meq/L (ref 96–112)
Glucose, Bld: 74 mg/dL (ref 70–99)
Potassium: 4.2 meq/L (ref 3.5–5.1)

## 2009-04-12 ENCOUNTER — Telehealth: Payer: Self-pay | Admitting: Internal Medicine

## 2009-05-07 ENCOUNTER — Telehealth: Payer: Self-pay | Admitting: Internal Medicine

## 2009-05-08 ENCOUNTER — Encounter (INDEPENDENT_AMBULATORY_CARE_PROVIDER_SITE_OTHER): Payer: Self-pay | Admitting: *Deleted

## 2009-05-14 ENCOUNTER — Telehealth: Payer: Self-pay | Admitting: Internal Medicine

## 2009-05-15 ENCOUNTER — Telehealth: Payer: Self-pay | Admitting: *Deleted

## 2009-05-28 ENCOUNTER — Ambulatory Visit: Payer: Self-pay | Admitting: Internal Medicine

## 2009-05-29 ENCOUNTER — Telehealth: Payer: Self-pay | Admitting: Internal Medicine

## 2009-05-30 LAB — CONVERTED CEMR LAB
AST: 26 units/L (ref 0–37)
Alkaline Phosphatase: 80 units/L (ref 39–117)
Basophils Absolute: 0 10*3/uL (ref 0.0–0.1)
Basophils Relative: 0.7 % (ref 0.0–3.0)
Bilirubin, Direct: 1.8 mg/dL — ABNORMAL HIGH (ref 0.0–0.3)
Cholesterol: 74 mg/dL (ref 0–200)
Eosinophils Absolute: 0.2 10*3/uL (ref 0.0–0.7)
GFR calc non Af Amer: 46.74 mL/min (ref >60)
LDL Cholesterol: 43 mg/dL (ref 0–99)
Lymphocytes Relative: 23.9 % (ref 12.0–46.0)
MCHC: 33.6 g/dL (ref 30.0–36.0)
Neutrophils Relative %: 63.2 % (ref 43.0–77.0)
Potassium: 3.9 meq/L (ref 3.5–5.1)
RBC: 3.13 M/uL — ABNORMAL LOW (ref 4.22–5.81)
Sodium: 141 meq/L (ref 135–145)
TSH: 3.04 microintl units/mL (ref 0.35–5.50)
Total Bilirubin: 3.8 mg/dL — ABNORMAL HIGH (ref 0.3–1.2)
VLDL: 10.6 mg/dL (ref 0.0–40.0)

## 2009-06-07 ENCOUNTER — Ambulatory Visit: Payer: Self-pay

## 2009-06-07 ENCOUNTER — Ambulatory Visit: Payer: Self-pay | Admitting: Internal Medicine

## 2009-06-07 ENCOUNTER — Ambulatory Visit (HOSPITAL_COMMUNITY): Admission: RE | Admit: 2009-06-07 | Discharge: 2009-06-07 | Payer: Self-pay | Admitting: Internal Medicine

## 2009-06-07 ENCOUNTER — Encounter: Payer: Self-pay | Admitting: Internal Medicine

## 2009-06-07 DIAGNOSIS — E876 Hypokalemia: Secondary | ICD-10-CM | POA: Insufficient documentation

## 2009-06-08 LAB — CONVERTED CEMR LAB
BUN: 37 mg/dL — ABNORMAL HIGH (ref 6–23)
Chloride: 107 meq/L (ref 96–112)
Glucose, Bld: 66 mg/dL — ABNORMAL LOW (ref 70–99)
Potassium: 4 meq/L (ref 3.5–5.1)

## 2009-06-11 ENCOUNTER — Encounter: Payer: Self-pay | Admitting: Internal Medicine

## 2009-06-15 ENCOUNTER — Encounter: Payer: Self-pay | Admitting: Internal Medicine

## 2009-06-19 ENCOUNTER — Telehealth: Payer: Self-pay | Admitting: Internal Medicine

## 2009-06-20 ENCOUNTER — Encounter: Payer: Self-pay | Admitting: Internal Medicine

## 2009-06-20 ENCOUNTER — Encounter: Admission: RE | Admit: 2009-06-20 | Discharge: 2009-06-20 | Payer: Self-pay | Admitting: Nephrology

## 2009-07-03 ENCOUNTER — Telehealth: Payer: Self-pay | Admitting: Internal Medicine

## 2009-07-11 ENCOUNTER — Encounter: Payer: Self-pay | Admitting: Internal Medicine

## 2009-07-12 ENCOUNTER — Encounter: Payer: Self-pay | Admitting: Internal Medicine

## 2009-08-01 ENCOUNTER — Encounter: Payer: Self-pay | Admitting: Internal Medicine

## 2009-08-31 ENCOUNTER — Telehealth: Payer: Self-pay | Admitting: Internal Medicine

## 2009-09-13 ENCOUNTER — Encounter: Payer: Self-pay | Admitting: Internal Medicine

## 2009-09-24 ENCOUNTER — Ambulatory Visit: Payer: Self-pay

## 2009-09-24 ENCOUNTER — Ambulatory Visit: Payer: Self-pay | Admitting: Internal Medicine

## 2009-09-24 ENCOUNTER — Ambulatory Visit (HOSPITAL_COMMUNITY): Admission: RE | Admit: 2009-09-24 | Discharge: 2009-09-24 | Payer: Self-pay | Admitting: Internal Medicine

## 2009-09-24 ENCOUNTER — Encounter: Payer: Self-pay | Admitting: Internal Medicine

## 2009-09-24 ENCOUNTER — Ambulatory Visit: Payer: Self-pay | Admitting: Cardiology

## 2009-11-08 ENCOUNTER — Telehealth: Payer: Self-pay | Admitting: Internal Medicine

## 2009-11-09 ENCOUNTER — Encounter: Payer: Self-pay | Admitting: Internal Medicine

## 2009-11-09 DIAGNOSIS — R0602 Shortness of breath: Secondary | ICD-10-CM

## 2009-11-16 ENCOUNTER — Encounter: Payer: Self-pay | Admitting: Internal Medicine

## 2009-12-10 ENCOUNTER — Telehealth: Payer: Self-pay | Admitting: Internal Medicine

## 2010-01-07 ENCOUNTER — Telehealth: Payer: Self-pay | Admitting: Internal Medicine

## 2010-01-30 ENCOUNTER — Ambulatory Visit
Admission: RE | Admit: 2010-01-30 | Discharge: 2010-01-30 | Payer: Self-pay | Source: Home / Self Care | Attending: Internal Medicine | Admitting: Internal Medicine

## 2010-01-30 ENCOUNTER — Encounter: Payer: Self-pay | Admitting: Internal Medicine

## 2010-01-30 LAB — GLUCOSE, CAPILLARY: Glucose-Capillary: 251 mg/dL — ABNORMAL HIGH (ref 70–99)

## 2010-01-30 LAB — CONVERTED CEMR LAB
Blood Glucose, AC Bkfst: 251 mg/dL
Microalb, Ur: 12.87 mg/dL — ABNORMAL HIGH (ref 0.00–1.89)

## 2010-02-01 ENCOUNTER — Telehealth: Payer: Self-pay | Admitting: Internal Medicine

## 2010-02-08 ENCOUNTER — Telehealth: Payer: Self-pay | Admitting: Internal Medicine

## 2010-02-13 ENCOUNTER — Ambulatory Visit: Admission: RE | Admit: 2010-02-13 | Discharge: 2010-02-13 | Payer: Self-pay | Source: Home / Self Care

## 2010-02-15 ENCOUNTER — Telehealth: Payer: Self-pay | Admitting: *Deleted

## 2010-02-16 ENCOUNTER — Encounter: Payer: Self-pay | Admitting: Internal Medicine

## 2010-02-19 ENCOUNTER — Telehealth: Payer: Self-pay | Admitting: *Deleted

## 2010-02-21 ENCOUNTER — Encounter (INDEPENDENT_AMBULATORY_CARE_PROVIDER_SITE_OTHER): Payer: Self-pay | Admitting: *Deleted

## 2010-02-24 LAB — CONVERTED CEMR LAB
BUN: 13 mg/dL (ref 6–23)
Calcium: 9.1 mg/dL (ref 8.4–10.5)
Creatinine, Ser: 1.2 mg/dL (ref 0.4–1.5)
GFR calc non Af Amer: 84.5 mL/min (ref >60)
Glucose, Bld: 185 mg/dL — ABNORMAL HIGH (ref 70–99)

## 2010-02-26 ENCOUNTER — Ambulatory Visit: Admission: RE | Admit: 2010-02-26 | Discharge: 2010-02-26 | Payer: Self-pay | Source: Home / Self Care

## 2010-02-26 NOTE — Progress Notes (Signed)
Summary: pt needs fluid pill called in  Phone Note Call from Patient Call back at Home Phone [phone] Message from:  Patient  Caller: Patient Reason for Call: Talk to Nurse, Talk to Doctor Summary of Call: pt had office visit yesterday and was suppose to have a fluid pill called in and he was calling to f/u on that cause he has not heard anything yet Initial call taken by: Omer Jack,  May 29, 2009 11:15 AM  Follow-up for Phone Call        Let him know that Dr. Tenny Craw was still waiting on his BMP from yesterday.  We will be in contact with him when this comes back. He verbalized understanding. Follow-up by: Minerva Areola, RN, BSN,  May 29, 2009 12:19 PM     Appended Document: pt needs fluid pill called in Patient contacted.

## 2010-02-26 NOTE — Progress Notes (Signed)
Summary: Refiil/gh  Phone Note Refill Request Message from:  Patient on March 06, 2009 9:33 AM  Refills Requested: Medication #1:  POTASSIUM CHLORIDE CRYS CR 20 MEQ TBCR one by mouth two times a day  Medication #2:  ACCUPRIL 40 MG TABS Take 1  tablets by mouth once a day  Method Requested: Electronic Initial call taken by: Angelina Ok RN,  March 06, 2009 9:34 AM Caller: Patient  Follow-up for Phone Call        Refill approved-nurse to complete  Additional Follow-up for Phone Call Additional follow up Details #1::        Patient doesn't needto continue taking potassium at this point!!!!!!!!!.    Prescriptions: ACCUPRIL 40 MG TABS (QUINAPRIL HCL) Take 1  tablets by mouth once a day  #30 x 10   Entered and Authorized by:   Vassie Loll MD   Signed by:   Vassie Loll MD on 03/06/2009   Method used:   [id number]       [address]       [place], [place]  [address]       Ph: [phone]       Fax: [phone]   RxID:   [id number]

## 2010-02-26 NOTE — Progress Notes (Signed)
Summary: phone/gg  Phone Note Call from Patient   Caller: Patient Summary of Call: Pt calls with c/o feeling full all the time.  When he eats he feels his food does not digest. BM normal. denies N/V or pain. Will see next week. Initial call taken by: Merrie Roof RN,  May 15, 2009 9:36 AM

## 2010-02-26 NOTE — Letter (Signed)
Summary: [facility] Patient Note   [facility] Patient Note   Imported By: Roderic Ovens 10/08/2009 15:40:47  _____________________________________________________________________  External Attachment:    Type:   Image     Comment:   External Document

## 2010-02-26 NOTE — Progress Notes (Signed)
Summary: Refill/gh  Phone Note Refill Request Message from:  Fax from Pharmacy on August 31, 2009 10:54 AM  Refills Requested: Medication #1:  ATIVAN 0.5 MG TABS Take 1 tablet by mouth two times a day as needed for nerves   Last Refilled: 07/02/2009  Method Requested: Electronic Initial call taken by: Angelina Ok RN,  August 31, 2009 10:55 AM  Follow-up for Phone Call        Refill approved-nurse to complete    Prescriptions: ATIVAN 0.5 MG TABS (LORAZEPAM) Take 1 tablet by mouth two times a day as needed for nerves  #60 x 1   Entered and Authorized by:   Vassie Loll MD   Signed by:   Vassie Loll MD on 09/01/2009   Method used:   Telephoned to ...       [id number]       [address]       [place], [place]  [address]       Ph: [phone]       Fax: [phone]   RxID:   [id number]

## 2010-02-26 NOTE — Letter (Signed)
Summary: [facility]   Imported By: Marylou Mccoy 08/10/2009 11:03:24  _____________________________________________________________________  External Attachment:    Type:   Image     Comment:   External Document

## 2010-02-26 NOTE — Progress Notes (Signed)
Summary: Refill/gh  Phone Note Refill Request Message from:  Pharmacy on February 22, 2009 1:49 PM  Refills Requested: Medication #1:  METFORMIN HCL 1000 MG TABS Take 1 tablet by mouth two times a day  Method Requested: Electronic Initial call taken by: Angelina Ok RN,  February 22, 2009 1:49 PM  Follow-up for Phone Call        Refill approved-nurse to complete    Prescriptions: METFORMIN HCL 1000 MG TABS (METFORMIN HCL) Take 1 tablet by mouth two times a day  #62 x 3   Entered and Authorized by:   Vassie Loll MD   Signed by:   Vassie Loll MD on 02/22/2009   Method used:   [facility]ron[facility]        [facility] [address] [id number]       [address]       [place], [place]  [address]       Ph: [phone]       Fax: [phone]   RxID:   [id number]

## 2010-02-26 NOTE — Progress Notes (Signed)
Summary: med refill/gp  Phone Note Refill Request Message from:  Fax from Pharmacy on November 08, 2009 9:28 AM  Refills Requested: Medication #1:  NEXIUM 20 MG  CPDR Take 1 tablet by mouth two times a day Last appt. 2/ /111.   Method Requested: Electronic Initial call taken by: Chinita Pester RN,  November 08, 2009 9:28 AM    Prescriptions: NEXIUM 20 MG  CPDR (ESOMEPRAZOLE MAGNESIUM) Take 1 tablet by mouth two times a day  #60 Capsule x 2   Entered by:   Burnett Kanaris, CNA   Authorized by:   Vassie Loll MD   Signed by:   Burnett Kanaris, CNA on 11/08/2009   Method used:   [facility]        [facility] [address] [phone]       [address]       [place], [place]  [address]       Ph: [phone]       Fax: [phone]   RxID:   [id number]   Appended Document: med refill/gp    Prescriptions: SIMVASTATIN 40 MG TABS (SIMVASTATIN) Take 1 tablet by mouth once a day  #30 x 2   Entered by:   Burnett Kanaris, CNA   Authorized by:   Sherrill Raring, MD, [facility]   Signed by:   Burnett Kanaris, CNA on 11/08/2009   Method used:   [facility]        [facility] [facility] [address] [phone][facility] (retail)       [address]       [place], [place]  [address]       Ph: [phone]       Fax: [phone]   RxID:   [id number]

## 2010-02-26 NOTE — Letter (Signed)
Summary: [facility]   Imported By: Marylou Mccoy 08/10/2009 09:22:52  _____________________________________________________________________  External Attachment:    Type:   Image     Comment:   External Document

## 2010-02-26 NOTE — Progress Notes (Signed)
Summary: fluids bluid up ,   Phone Note Call from Patient Call back at [facility] Phone [phone]   Caller: Other Relative- karen boone [phone]- Reason for Call: Talk to Nurse Summary of Call: pt having problem, fluid pill was increase, c/o fluids all over. pt unable to get his meds filled. pt nos appt on 4/11. pt stats that Annice Pih has talk to her before. Initial call taken by: Lorne Skeens,  May 14, 2009 11:31 AM  Follow-up for Phone Call        05/14/09-12noon--pt's friend calling stating mr Guise out of spironolactone and has developed more edema--advised will call in rx for 1 month, but needs to make f/u appoint and keep it with dr Tenny Craw --friend agrees--transferred to appointments--nt Follow-up by: Ledon Snare, RN,  May 14, 2009 11:54 AM

## 2010-02-26 NOTE — Miscellaneous (Signed)
Summary: Appointment No Show  Appointment status changed to no show by [employer] on 08/01/2009 3:35 PM.  No Show Comments ---------------- repeat echo in july dx 425.4/medicaid no precert rqd/sl  Appointment Information ----------------------- Appt Type:  CARDIOLOGY ANCILLARY VISIT      Date:  Wednesday, August 01, 2009      Time:  8:30 AM for 60 min   Urgency:  Routine   Made By:  Hoy Finlay Scheduler  To Visit:  [id number]    Reason:  repeat echo in july dx 425.4/medicaid no precert rqd/sl  Appt Comments ------------- -- 08/01/09 15:35: ([facility]) NO SHOW -- repeat echo in july dx 425.4/medicaid no precert rqd/sl -- 06/18/09 9:55: ([facility]) BOOKED -- Routine CARDIOLOGY ANCILLARY VISIT at 08/01/2009 8:30 AM for 60 min repeat echo in july dx 425.4/medicaid no precert rqd/sl

## 2010-02-26 NOTE — Progress Notes (Signed)
Summary: phone/gg  Phone Note Refill Request  on March 05, 2009 10:29 AM  Pt states his insurance will not cover Rx for NASONEX 50 MCG/ACT SUSP (MOMETASONE FURO.  Can you change to veramyst nasal spray.   Method Requested: Electronic Initial call taken by: Merrie Roof RN,  March 05, 2009 10:29 AM Caller: Patient Initial call taken by: Merrie Roof RN,  March 05, 2009 10:28 AM  Follow-up for Phone Call        no problem!!!! Ihave change nasonex for veramyst spray, prescription sent electronically.

## 2010-02-26 NOTE — Miscellaneous (Signed)
  Clinical Lists Changes  Problems: Added new problem of SHORTNESS OF BREATH (ICD-786.05) Orders: Added new Referral order of Misc. Referral (Misc. Ref) - Signed

## 2010-02-26 NOTE — Progress Notes (Signed)
Summary: med refill/gp  Phone Note Refill Request Message from:  Fax from Pharmacy on April 12, 2009 12:22 PM  Refills Requested: Medication #1:  BD U/F III MINI PEN NEEDLE 31G X 5 MM  MISC   Last Refilled: 01/01/2009 requested short 31G x 5/16   Method Requested: Electronic Initial call taken by: Chinita Pester RN,  April 12, 2009 12:22 PM  Follow-up for Phone Call        Refill approved-nurse to complete  Additional Follow-up for Phone Call Additional follow up Details #1::        Please call that in for Mr. Kluth; i don't know why but I coul not send it electornically.    Prescriptions: BD U/F III MINI PEN NEEDLE 31G X 5 MM  MISC (INSULIN PEN NEEDLE)   #100 x 11   Entered and Authorized by:   Vassie Loll MD   Signed by:   Vassie Loll MD on 04/12/2009   Method used:   Telephoned to ...       [facility] [address] [id number]       [address]       [place], [place]  [address]       Ph: [phone]       Fax: [phone]   RxID:   [id number]   Appended Document: med refill/gp Above [facility]

## 2010-02-26 NOTE — Progress Notes (Signed)
Summary: Ear pain  Phone Note Call from Patient   Caller: Patient Call For: Vassie Loll MD Summary of Call: Call from says he is having problems with his ear.  Feeling dizzy.  Moisture in his ear after taking shower is when this started.  Dizziness and whoozy after walking.  Having dizzy spells.  Is burning and itching.  Using sweet oil.  Itches and burns after taking out of his year.  When he takes cotton out ears itch and burn.  No fever.  Taking Tylenol.  Feels that sugars are going low.  Sugars are 135 and 145.   Wants something for the discomfort.  Pt was given an appointment and advised to go to the Urgent care.Angelina Ok RN  February 09, 2009 3:24 PM  Initial call taken by: Angelina Ok RN,  February 09, 2009 3:24 PM

## 2010-02-26 NOTE — Miscellaneous (Signed)
  Clinical Lists Changes  Orders: Added new Referral order of Echocardiogram (Echo) - Signed 

## 2010-02-26 NOTE — Progress Notes (Signed)
Summary: refill/ hla  Phone Note Refill Request Message from:  Patient on July 03, 2009 12:19 PM  Refills Requested: Medication #1:  METFORMIN HCL 1000 MG TABS Take 1 tablet by mouth two times a day Initial call taken by: Marin Roberts RN,  July 03, 2009 12:19 PM  Follow-up for Phone Call        Refill approved-nurse to complete    Prescriptions: METFORMIN HCL 1000 MG TABS (METFORMIN HCL) Take 1 tablet by mouth two times a day  #62 x 5   Entered and Authorized by:   Vassie Loll MD   Signed by:   Vassie Loll MD on 07/04/2009   Method used:   [facility]ron[facility]        [facility] [address] [phone][facility] ([facility]ail)       [address]       [place], [place]  [address]       Ph: [phone]       Fax: [phone]   RxID:   [id number]

## 2010-02-26 NOTE — Progress Notes (Signed)
Summary: out of meds 4-5- days/ refill meds  Phone Note Call from Patient Call back at Home Phone [phone] Call back at [phone] Message from:  Patient on March 08, 2009 4:59 PM  pt is out of k+ 20 mg. [facility] [address] [phone].  Caller: Patient Reason for Call: Talk to Nurse Details for Reason:  out since 4-5 days.  Initial call taken by: Lorne Skeens,  March 08, 2009 5:00 PM  Follow-up for Phone Call        Pt states he needs a new RX for Potassium. Pharmacy told him it is prescribed by Dr. Tenny Craw. It was not on his current med list. He states he has been on it for years. I do see it when referring  back to 07/20/08 OV with Dr. Ladona Ridgel. RX was sent. I will forward this to Dr. Tenny Craw and to Annice Pih, RN for f/u in case this is a problem. Pt will see Dr. Tenny Craw in the next couple months (per pt). No appt in system. Last BUN in EMR was good from 11/10. Pt is on diuretics.  Follow-up by: Duncan Dull, RN, BSN,  March 08, 2009 5:43 PM    New/Updated Medications: POTASSIUM CHLORIDE CRYS CR 20 MEQ CR-TABS (POTASSIUM CHLORIDE CRYS CR) two times a day Prescriptions: POTASSIUM CHLORIDE CRYS CR 20 MEQ CR-TABS (POTASSIUM CHLORIDE CRYS CR) two times a day  #60 x 6   Entered by:   Duncan Dull, RN, BSN   Authorized by:   Sherrill Raring, MD, [facility]   Signed by:   Duncan Dull, RN, BSN on 03/08/2009   Method used:   [facility]        [facility] [id number]       [address]       [place], [place]  [address]       Ph: [phone]       Fax: [phone]   RxID:   [id number]   Appended Document: out of meds 4-5- days/ refill meds Patient needs to have f/u BMET with confusion in meds.  Make sure he has clinic f/u in May.  Appended Document: out of meds 4-5- days/ refill meds Both phone #'s not working....will try again.  Appended Document: out of meds 4-5- days/ refill meds Called patient... set up for lab work  for 2/22.  Patient wants to be seen sooner than May because he is requesting medication for ED. He states that Dr.Tekisha Darcey said that she might give him some ED medication if his heart was OK. Set ROV for 3/17.

## 2010-02-26 NOTE — Miscellaneous (Signed)
Summary: xray order  Clinical Lists Changes  Orders: Added new Test order of T-2 View CXR (71020TC) - Signed

## 2010-02-26 NOTE — Progress Notes (Signed)
Summary: refill/gg  Phone Note Refill Request  on May 07, 2009 3:34 PM  Refills Requested: Medication #1:  ATIVAN 0.5 MG TABS Take 1 tablet by mouth two times a day as needed for nerves   Last Refilled: 04/24/2009  Method Requested: Fax to Local Pharmacy Initial call taken by: Merrie Roof RN,  May 07, 2009 3:35 PM  Follow-up for Phone Call        Refill approved-nurse to complete  Additional Follow-up for Phone Call Additional follow up Details #1::        Prescription printed and signed. Venita Sheffield will take care of faxing it over to the pharmacy.    Prescriptions: ATIVAN 0.5 MG TABS (LORAZEPAM) Take 1 tablet by mouth two times a day as needed for nerves  #60 x 1   Entered and Authorized by:   Vassie Loll MD   Signed by:   Vassie Loll MD on 05/07/2009   Method used:   Printed then faxed to ...       [facility] [address] [id number]       [address]       [place], [place]  [address]       Ph: [phone]       Fax: [phone]   RxID:   [id number]   Appended Document: refill/gg Prescription for Ativan 0.5 mg tablets # 60 with 1 refill [facility] per order of Dr.Marquel Pottenger.

## 2010-02-26 NOTE — Progress Notes (Signed)
Summary: referral kidney specialist  Phone Note Call from Patient Call back at Home Phone [phone]   Caller: Other Relative - karen boone [phone] Reason for Call: Talk to Nurse, Referral Summary of Call: question pt received a call from office. kidney specialist. referral dr. Lowell Guitar. Initial call taken by: Lorne Skeens,  May 29, 2009 2:19 PM  Follow-up for Phone Call        I have spoken with Nephrology office.    Additional Follow-up for Phone Call Additional follow up Details #1::        I called and spoke with Clydie Braun. She states the pt is upset about the call he received from Dr. Tenny Craw stating she wanted to refer him to a kidney specialist. Clydie Braun states she is a neighbor of Mr. Soward and she would suggest Dr. Lowell Guitar. I have advised Clydie Braun that I will call and speak with the pt. Dr. Tenny Craw has also called and advised me of the renal referral and that she has spoke with the [facility]. We just need to sent his records. She would also like for the to have a repeat echo done. Sherri Rad, RN, BSN  May 29, 2009 2:41 PM   I have left a message for the pt to call. Sherri Rad, RN, BSN  May 29, 2009 2:42 PM  I have spoken with the pt. He is aware we will contact him about the renal appt. and I will ask one of our schedulers to contact him about setting up the echo. The pt is agreeable. 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  Has appt with Casimiro Needle on 5/16.  Will schedule echo. Follow-up by: Sherrill Raring, MD, [facility],  May 29, 2009 11:18 PM  New Problems: KIDNEY DISEASE (ICD-593.9)

## 2010-02-26 NOTE — Progress Notes (Signed)
Summary: Nasal Spray  Phone Note Outgoing Call   Call placed by: Angelina Ok RN,  March 06, 2009 9:39 AM Summary of Call: Call to pharmacy.  They did not receive the Veramyst.  Medicaid will cove plain Flonase.Angelina Ok RN  March 06, 2009 9:41 AM  Initial call taken by: Angelina Ok RN,  March 06, 2009 9:41 AM  Follow-up for Phone Call        Can we confirmed which medication would be covered, this is the 3 time we change it.    New/Updated Medications: FLONASE 50 MCG/ACT SUSP (FLUTICASONE PROPIONATE) 2 Spray in each nostril daily. Prescriptions: FLONASE 50 MCG/ACT SUSP (FLUTICASONE PROPIONATE) 2 Spray in each nostril daily.  #1 x 3   Entered and Authorized by:   Vassie Loll MD   Signed by:   Vassie Loll MD on 03/06/2009   Method used:   [facility]ronically to        [facility] [phone][facility] ([facility]ail)       [address]       [place], [place]  [address]       Ph: [phone]       Fax: [phone]   RxID:   [id number]

## 2010-02-26 NOTE — Letter (Signed)
[facility]   Imported By: Roderic Ovens 10/08/2009 15:41:40  _____________________________________________________________________  External Attachment:    Type:   Image     Comment:   External Document

## 2010-02-26 NOTE — Consult Note (Signed)
Summary: [facility]  [facility]   Imported By: Marylou Mccoy 07/31/2009 12:55:14  _____________________________________________________________________  External Attachment:    Type:   Image     Comment:   External Document

## 2010-02-26 NOTE — Progress Notes (Signed)
Summary: Medications  Phone Note Outgoing Call Message from:  Patient on March 07, 2009 3:29 PM  Call placed by: Angelina Ok RN,  March 07, 2009 3:29 PM Call placed to: Patient Summary of Call: Unable to reach pt per phone to notify him that the Flonase was at the pharmacy and that there is no need for the Potassium to be refilled at this time. Angelina Ok RN  March 07, 2009 3:30 PM  Initial call taken by: Angelina Ok RN,  March 07, 2009 3:31 PM

## 2010-02-26 NOTE — Progress Notes (Signed)
Summary: refill/gg  Phone Note Refill Request  on Jun 19, 2009 11:21 AM  Refills Requested: Medication #1:  METOPROLOL TARTRATE 100 MG  TABS Take 1 tablet by mouth two times a day  Method Requested: Electronic Initial call taken by: Merrie Roof RN,  Jun 19, 2009 11:21 AM  Follow-up for Phone Call        Refill approved-nurse to complete    Prescriptions: METOPROLOL TARTRATE 100 MG  TABS (METOPROLOL TARTRATE) Take 1 tablet by mouth two times a day  #62 x 6   Entered and Authorized by:   Vassie Loll MD   Signed by:   Vassie Loll MD on 06/20/2009   Method used:   [facility]ically to        [facility] [id number]       [address]       [place], [place]  [address]       Ph: [phone]       Fax: [phone]   RxID:   [id number]

## 2010-02-26 NOTE — Assessment & Plan Note (Signed)
Summary: ACUTE-BLOOD IN EAR/CFB   Vital Signs:  Patient profile:   45 year old male Height:      73 inches (185.42 cm) Weight:      356.3 pounds (161.95 kg) BMI:     47.18 Temp:     97.0 degrees F (36.11 degrees C) oral Pulse rate:   70 / minute BP sitting:   117 / 75  (right arm) BP standing:   116 / 70  (right arm)  Vitals Entered By: Stanton Kidney Ditzler RN (February 27, 2009 10:16 AM) Is Patient Diabetic? Yes Did you bring your meter with you today? No Pain Assessment Patient in pain? no      Nutritional Status BMI of > 30 = obese Nutritional Status Detail appetite good CBG Result 130  Have you ever been in a relationship where you felt threatened, hurt or afraid?denies   Does patient need assistance? Functional Status Self care Ambulation Normal Comments Past 2 weeks - problems with both ears and occ itchy ears. Occ dizziness.   History of Present Illness: Adam [name] is a 45 y/o man with PMH as outlined in the EMR.  He is here today complaining of increased nasal congestion, inttermitently morning nose bleeding; dizziness, PND and ear itching.    Pt denies CP, sob, fever, abdominal pain, severe reflux, abnormal weight changes, nausea, vomiting or any other complains at this point.    BP is well controlled. Patient is compliant with his medications.  Depression History:      The patient denies a depressed mood most of the day and a diminished interest in his usual daily activities.        The patient denies that he feels like life is not worth living, denies that he wishes that he were dead, and denies that he has thought about ending his life.         Preventive Screening-Counseling & Management  Alcohol-Tobacco     Alcohol drinks/day: 0     Smoking Status: current     Smoking Cessation Counseling: yes     Packs/Day: 3 cigs daily     Year Started: age 18 yrs old     Year Quit: stopped then restarted     Passive Smoke Exposure: no  Caffeine-Diet-Exercise     Does  Patient Exercise: yes     Type of exercise: walking     Times/week: 2  Problems Prior to Update: 1)  Anxiety  (ICD-300.00) 2)  Cardiomyopathy  (ICD-425.4) 3)  Hypertension  (ICD-401.9) 4)  Diabetes Mellitus, Type II  (ICD-250.00) 5)  Kidney Disease, Chronic, Stage I  (ICD-585.1) 6)  Dyslipidemia  (ICD-272.4) 7)  Insomnia Unspecified  (ICD-780.52) 8)  Obstructive Sleep Apnea  (ICD-327.23) 9)  Luq Pain  (ICD-789.02) 10)  Allergic Rhinitis  (ICD-477.9) 11)  Gerd  (ICD-530.81) 12)  Morbid Obesity  (ICD-278.01) 13)  Dysuria  (ICD-788.1) 14)  Tobacco Abuse  (ICD-305.1)  Current Problems (verified): 1)  Anxiety  (ICD-300.00) 2)  Cardiomyopathy  (ICD-425.4) 3)  Hypertension  (ICD-401.9) 4)  Diabetes Mellitus, Type II  (ICD-250.00) 5)  Kidney Disease, Chronic, Stage I  (ICD-585.1) 6)  Dyslipidemia  (ICD-272.4) 7)  Insomnia Unspecified  (ICD-780.52) 8)  Obstructive Sleep Apnea  (ICD-327.23) 9)  Luq Pain  (ICD-789.02) 10)  Allergic Rhinitis  (ICD-477.9) 11)  Gerd  (ICD-530.81) 12)  Morbid Obesity  (ICD-278.01) 13)  Dysuria  (ICD-788.1) 14)  Tobacco Abuse  (ICD-305.1)  Medications Prior to Update: 1)  Ativan 0.5 Mg  Tabs (Lorazepam) .... Take 1 Tablet By Mouth Two Times A Day As Needed For Nerves 2)  Metoprolol Tartrate 100 Mg  Tabs (Metoprolol Tartrate) .... Take 1 Tablet By Mouth Two Times A Day 3)  Nexium 20 Mg  Cpdr (Esomeprazole Magnesium) .... Take 1 Tablet By Mouth Two Times A Day 4)  Accupril 40 Mg Tabs (Quinapril Hcl) .... Take 1  Tablets By Mouth Once A Day 5)  Metformin Hcl 1000 Mg Tabs (Metformin Hcl) .... Take 1 Tablet By Mouth Two Times A Day 6)  Lantus 100 Unit/ml Soln (Insulin Glargine) .... Inject 36 Units Subcutaneously Once At Night 7)  Spironolactone 25 Mg Tabs (Spironolactone) .... Take 1 Tablet By Mouth Once A Day 8)  Bd U/f Iii Mini Pen Needle 31g X 5 Mm  Misc (Insulin Pen Needle) 9)  Veramyst 27.5 Mcg/spray  Susp (Fluticasone Furoate) .... One Spray in Each  Nostril As Needed 10)  Unilet Excelite Ii   Misc (Lancets) 11)  Truetrack Test   Strp (Glucose Blood) 12)  Potassium Chloride Crys Cr 20 Meq Tbcr (Potassium Chloride Crys Cr) .... One By Mouth Two Times A Day 13)  Furosemide 80 Mg Tabs (Furosemide) .... Take Two Tablets By Mouth Twice A Day 14)  Simvastatin 40 Mg Tabs (Simvastatin) .... Take 1 Tablet By Mouth Once A Day  Current Medications (verified): 1)  Ativan 0.5 Mg Tabs (Lorazepam) .... Take 1 Tablet By Mouth Two Times A Day As Needed For Nerves 2)  Metoprolol Tartrate 100 Mg  Tabs (Metoprolol Tartrate) .... Take 1 Tablet By Mouth Two Times A Day 3)  Nexium 20 Mg  Cpdr (Esomeprazole Magnesium) .... Take 1 Tablet By Mouth Two Times A Day 4)  Accupril 40 Mg Tabs (Quinapril Hcl) .... Take 1  Tablets By Mouth Once A Day 5)  Metformin Hcl 1000 Mg Tabs (Metformin Hcl) .... Take 1 Tablet By Mouth Two Times A Day 6)  Lantus 100 Unit/ml Soln (Insulin Glargine) .... Inject 36 Units Subcutaneously Once At Night 7)  Spironolactone 25 Mg Tabs (Spironolactone) .... Take 1 Tablet By Mouth Once A Day 8)  Bd U/f Iii Mini Pen Needle 31g X 5 Mm  Misc (Insulin Pen Needle) 9)  Veramyst 27.5 Mcg/spray  Susp (Fluticasone Furoate) .... One Spray in Each Nostril As Needed 10)  Unilet Excelite Ii   Misc (Lancets) 11)  Truetrack Test   Strp (Glucose Blood) 12)  Potassium Chloride Crys Cr 20 Meq Tbcr (Potassium Chloride Crys Cr) .... One By Mouth Two Times A Day 13)  Furosemide 80 Mg Tabs (Furosemide) .... Take Two Tablets By Mouth Twice A Day 14)  Simvastatin 40 Mg Tabs (Simvastatin) .... Take 1 Tablet By Mouth Once A Day  Allergies (verified): No Known Drug Allergies  Past History:  Past Medical History: Last updated: 04/29/2008 DYSLIPIDEMIA (ICD-272.4) INSOMNIA UNSPECIFIED (ICD-780.52) OBSTRUCTIVE SLEEP APNEA (ICD-327.23) LUQ PAIN (ICD-789.02) ALLERGIC RHINITIS (ICD-477.9) CARDIOMYOPATHY (ICD-425.4) HYPERTENSION (ICD-401.9) DIABETES MELLITUS, TYPE  II (ICD-250.00)     KIDNEY DISEASE, CHRONIC, STAGE I (ICD-585.1) GERD (ICD-530.81) MORBID OBESITY (ICD-278.01) DYSURIA (ICD-788.1) TOBACCO ABUSE (ICD-305.1)    Family History: Last updated: 29-Apr-2008 Mother: died @ 26 due to diabetes Father: died @ 30 MI  Social History: Last updated: Apr 29, 2008 On disability Alcohol Use - no Drug Use - no  Risk Factors: Alcohol Use: 0 (02/27/2009) Exercise: yes (02/27/2009)  Risk Factors: Smoking Status: current (02/27/2009) Packs/Day: 3 cigs daily (02/27/2009) Passive Smoke Exposure: no (02/27/2009)  Review of Systems  As per HPI.  Physical Exam  General:  patient is a obese, well hydrated, alert, awake and cooperative with examination. Nose:  mucosal erythema, mucosal friability, L frontal sinus tenderness, L maxillary sinus tenderness, R frontal sinus tenderness, and R maxillary sinus tenderness.   Lungs:  Normal respiratory effort, chest expands symmetrically. Lungs are clear to auscultation, no crackles or wheezes. Heart:  regular rate and rhythm. S1, S2. No S3. No murmurs Abdomen:  obese, soft, non-tender, normal bowel sounds, no guarding, and no rigidity.   Extremities:  trace edema bilaterally. Neurologic:  alert & oriented X3, cranial nerves II-XII intact, strength normal in all extremities, sensation intact to light touch, and gait normal.     Impression & Recommendations:  Problem # 1:  DIZZINESS (ICD-780.4) Pt reports experiencing some dizziness over the last 3-4 days, on/off and with sensation of unbalance gait. He was also having ear discomfort and severe sinus congestion. His dizziness looks to be associated with increased sinus congestion, other causes could be ear infection (laberintitis), meniere syndrome, hypoglycemic events or orthostatic hypotension. Pt vitall signs were normal (including orthostatic vs); CBG was 130 and his glucometer has no register any low CBG either. Will treat his sinusitis using  loratadine and also veramyst (since insurance will no pay for nasonex). No abnormalities seen on ear exam.  His updated medication list for this problem includes:    Loratadine 10 Mg Tabs (Loratadine) .Marland Kitchen... Take 1 tablet by mouth once a day  Problem # 2:  HYPERTENSION (ICD-401.9) Well controlled and at goal. Will continue current regimen. Patient advised to follow a low sodium diet.  His updated medication list for this problem includes:    Metoprolol Tartrate 100 Mg Tabs (Metoprolol tartrate) .Marland Kitchen... Take 1 tablet by mouth two times a day    Accupril 40 Mg Tabs (Quinapril hcl) .Marland Kitchen... Take 1  tablets by mouth once a day    Spironolactone 25 Mg Tabs (Spironolactone) .Marland Kitchen... Take 1 tablet by mouth once a day    Furosemide 80 Mg Tabs (Furosemide) .Marland Kitchen... Take two tablets by mouth twice a day  Problem # 3:  DIABETES MELLITUS, TYPE II (ICD-250.00) CBG was in normal range and his most recent A1C demonstrated an excellent control of his DM. No medication changes are needed at this point, will continue current regimen. Patient advised to follow a low carb diet and to lose weight.  His updated medication list for this problem includes:    Accupril 40 Mg Tabs (Quinapril hcl) .Marland Kitchen... Take 1  tablets by mouth once a day    Metformin Hcl 1000 Mg Tabs (Metformin hcl) .Marland Kitchen... Take 1 tablet by mouth two times a day    Lantus 100 Unit/ml Soln (Insulin glargine) ..... Inject 36 units subcutaneously once at night  Orders: Capillary Blood Glucose/CBG ([address]) Ophthalmology Referral (Ophthalmology)  Problem # 4:  ALLERGIC RHINITIS (ICD-477.9) Will use veramyst nasal spray and daily loratadine. Patient encouraged to stop smoking.  The following medications were removed from the medication list:    Nasonex 50 Mcg/act Susp (Mometasone furoate) .Marland Kitchen..Marland Kitchen Two sprays inside each nostril once a day. His updated medication list for this problem includes:    Veramyst 27.5 Mcg/spray Susp (Fluticasone furoate) ..... One spray in  each nostril daily.    Loratadine 10 Mg Tabs (Loratadine) .Marland Kitchen... Take 1 tablet by mouth once a day  Problem # 5:  ANXIETY (ICD-300.00) Stable. Will continue using lorazepam as needed. Prescription refill during this visit.  His updated medication list for  this problem includes:    Ativan 0.5 Mg Tabs (Lorazepam) .Marland Kitchen... Take 1 tablet by mouth two times a day as needed for nerves  Complete Medication List: 1)  Ativan 0.5 Mg Tabs (Lorazepam) .... Take 1 tablet by mouth two times a day as needed for nerves 2)  Metoprolol Tartrate 100 Mg Tabs (Metoprolol tartrate) .... Take 1 tablet by mouth two times a day 3)  Nexium 20 Mg Cpdr (Esomeprazole magnesium) .... Take 1 tablet by mouth two times a day 4)  Accupril 40 Mg Tabs (Quinapril hcl) .... Take 1  tablets by mouth once a day 5)  Metformin Hcl 1000 Mg Tabs (Metformin hcl) .... Take 1 tablet by mouth two times a day 6)  Lantus 100 Unit/ml Soln (Insulin glargine) .... Inject 36 units subcutaneously once at night 7)  Spironolactone 25 Mg Tabs (Spironolactone) .... Take 1 tablet by mouth once a day 8)  Bd U/f Iii Mini Pen Needle 31g X 5 Mm Misc (Insulin pen needle) 9)  Veramyst 27.5 Mcg/spray Susp (Fluticasone furoate) .... One spray in each nostril daily. 10)  Unilet Excelite Ii Misc (Lancets) 11)  Truetrack Test Strp (Glucose blood) 12)  Potassium Chloride Crys Cr 20 Meq Tbcr (Potassium chloride crys cr) .... One by mouth two times a day 13)  Furosemide 80 Mg Tabs (Furosemide) .... Take two tablets by mouth twice a day 14)  Simvastatin 40 Mg Tabs (Simvastatin) .... Take 1 tablet by mouth once a day 15)  Loratadine 10 Mg Tabs (Loratadine) .... Take 1 tablet by mouth once a day  Patient Instructions: 1)  Please schedule a follow-up appointment in 1 month with me. 2)  Take your medications as prescribed. 3)  Follow a low sodium and low fat diet. Prescriptions: ATIVAN 0.5 MG TABS (LORAZEPAM) Take 1 tablet by mouth two times a day as needed for  nerves  #60 x 1   Entered and Authorized by:   Vassie Loll MD   Signed by:   Vassie Loll MD on 02/27/2009   Method used:   Print then Give to Patient   RxID:   [id number] LORATADINE 10 MG TABS (LORATADINE) Take 1 tablet by mouth once a day  #31 x 3   Entered and Authorized by:   Vassie Loll MD   Signed by:   Vassie Loll MD on 02/27/2009   Method used:   Print then Give to Patient   RxID:   [id number] NASONEX 50 MCG/ACT SUSP (MOMETASONE FUROATE) Two sprays inside each nostril once a day.  #1 x 5   Entered and Authorized by:   Vassie Loll MD   Signed by:   Vassie Loll MD on 02/27/2009   Method used:   Print then Give to Patient   RxID:   [id number]    Prevention & Chronic Care Immunizations   Influenza vaccine: Not documented   Influenza vaccine deferral: Refused  (01/01/2009)    Tetanus booster: Not documented   Td booster deferral: Refused  (01/01/2009)    Pneumococcal vaccine: Not documented   Pneumococcal vaccine deferral: Deferred  (01/01/2009)  Other Screening   Smoking status: current  (02/27/2009)   Smoking cessation counseling: yes  (02/27/2009)  Diabetes Mellitus   HgbA1C: 5.4  (01/01/2009)    Eye exam: Not documented   Diabetic eye exam action/deferral: Ophthalmology referral  (02/27/2009)    Foot exam: Not documented   Foot exam action/deferral: Do today   High risk foot: No  (01/01/2009)   Foot care  education: Done  (01/01/2009)    Urine microalbumin/creatinine ratio: 110.9  (07/05/2007)    Diabetes flowsheet reviewed?: Yes   Progress toward A1C goal: At goal   Diabetes comments: Patient willlike for [place] tohelphim finding another eye doctor.  Lipids   Total Cholesterol: 96  (04/06/2008)   LDL: 68  (04/06/2008)   LDL Direct: Not documented   HDL: 16.9  (04/06/2008)   Triglycerides: 56  (04/06/2008)    SGOT (AST): 18  (01/01/2009)   SGPT (ALT): 10  (01/01/2009)   Alkaline phosphatase: 109  (01/01/2009)   Total  bilirubin: 3.9  (01/01/2009)    Lipid flowsheet reviewed?: Yes   Progress toward LDL goal: At goal  Hypertension   Last Blood Pressure: 117 / 75  (02/27/2009)   Serum creatinine: 1.04  (01/01/2009)   Serum potassium 3.7  (01/01/2009)    Hypertension flowsheet reviewed?: Yes   Progress toward BP goal: At goal  Self-Management Support :   Personal Goals (by the next clinic visit) :     Personal A1C goal: 6  (01/01/2009)     Personal blood pressure goal: 130/80  (01/01/2009)     Personal LDL goal: 70  (01/01/2009)    Patient will work on the following items until the next clinic visit to reach self-care goals:     Medications and monitoring: take my medicines every day, check my blood sugar, examine my feet every day  (02/27/2009)     Eating: drink diet soda or water instead of juice or soda, eat more vegetables, use fresh or frozen vegetables, eat foods that are low in salt, eat fruit for snacks and desserts, limit or avoid alcohol  (02/27/2009)     Activity: take a 30 minute walk every day, take the stairs instead of the elevator, park at the far end of the parking lot  (02/27/2009)     Home glucose monitoring frequency: 1 time daily  (01/01/2009)    Diabetes self-management support: Written self-care plan  (02/27/2009)   Diabetes care plan printed    Hypertension self-management support: Written self-care plan  (02/27/2009)   Hypertension self-care plan printed.    Lipid self-management support: Written self-care plan  (02/27/2009)   Lipid self-care plan printed.   Nursing Instructions: Refer for screening diabetic eye exam (see order) Refer for screening diabetic eye exam (see order)

## 2010-02-26 NOTE — Assessment & Plan Note (Signed)
Summary: ROV/PER NANCY/JSS   History of Present Illness: : Adam [name] is a 45 year old gentleman with a history of hypertension, nonischemic cardiomyopathy, dyslipidemia, diabetes. I last saw him in the fall.  since seen he had an AICD this winter. since seen, he said he has had increased swelling of his legs and abdomen over the past couple weeks.  Taking meds.  He says that sometimes with the Lasxi he will respond, other times he does not.  Is up to 3 tabs 2x per day.  No chest pain, PND or increased orthopnea.  Appetite is down.  Problems Prior to Update: 1)  CHF  (ICD-428.0) 2)  Dizziness  (ICD-780.4) 3)  Anxiety  (ICD-300.00) 4)  Cardiomyopathy  (ICD-425.4) 5)  Hypertension  (ICD-401.9) 6)  Diabetes Mellitus, Type II  (ICD-250.00) 7)  Kidney Disease, Chronic, Stage I  (ICD-585.1) 8)  Dyslipidemia  (ICD-272.4) 9)  Insomnia Unspecified  (ICD-780.52) 10)  Obstructive Sleep Apnea  (ICD-327.23) 11)  Luq Pain  (ICD-789.02) 12)  Allergic Rhinitis  (ICD-477.9) 13)  Gerd  (ICD-530.81) 14)  Morbid Obesity  (ICD-278.01) 15)  Dysuria  (ICD-788.1) 16)  Tobacco Abuse  (ICD-305.1)  Current Medications (verified): 1)  Ativan 0.5 Mg Tabs (Lorazepam) .... Take 1 Tablet By Mouth Two Times A Day As Needed For Nerves 2)  Metoprolol Tartrate 100 Mg  Tabs (Metoprolol Tartrate) .... Take 1 Tablet By Mouth Two Times A Day 3)  Nexium 20 Mg  Cpdr (Esomeprazole Magnesium) .... Take 1 Tablet By Mouth Two Times A Day 4)  Accupril 40 Mg Tabs (Quinapril Hcl) .... Take 1  Tablets By Mouth Once A Day 5)  Metformin Hcl 1000 Mg Tabs (Metformin Hcl) .... Take 1 Tablet By Mouth Two Times A Day 6)  Lantus 100 Unit/ml Soln (Insulin Glargine) .... Inject 36 Units Subcutaneously Once At Night 7)  Spironolactone 25 Mg Tabs (Spironolactone) .... Take 1 Tablet By Mouth Once A Day 8)  Bd U/f Iii Mini Pen Needle 31g X 5 Mm  Misc (Insulin Pen Needle) 9)  Flonase 50 Mcg/act Susp (Fluticasone Propionate) .... 2 Spray in Each  Nostril Daily. 10)  Unilet Excelite Ii   Misc (Lancets) 11)  Truetrack Test   Strp (Glucose Blood) 12)  Furosemide 80 Mg Tabs (Furosemide) .... Take Two Tablets By Mouth Twice A Day 13)  Simvastatin 40 Mg Tabs (Simvastatin) .... Take 1 Tablet By Mouth Once A Day 14)  Loratadine 10 Mg Tabs (Loratadine) .... Take 1 Tablet By Mouth Once A Day 15)  Potassium Chloride Crys Cr 20 Meq Cr-Tabs (Potassium Chloride Crys Cr) .... Two Times A Day  Allergies (verified): No Known Drug Allergies  Past History:  Past medical, surgical, family and social histories (including risk factors) reviewed, and no changes noted (except as noted below).  Past Medical History: Reviewed history from 04/26/2008 and no changes required. DYSLIPIDEMIA (ICD-272.4) INSOMNIA UNSPECIFIED (ICD-780.52) OBSTRUCTIVE SLEEP APNEA (ICD-327.23) LUQ PAIN (ICD-789.02) ALLERGIC RHINITIS (ICD-477.9) CARDIOMYOPATHY (ICD-425.4) HYPERTENSION (ICD-401.9) DIABETES MELLITUS, TYPE II (ICD-250.00)     KIDNEY DISEASE, CHRONIC, STAGE I (ICD-585.1) GERD (ICD-530.81) MORBID OBESITY (ICD-278.01) DYSURIA (ICD-788.1) TOBACCO ABUSE (ICD-305.1)    Family History: Reviewed history from 04/26/2008 and no changes required. Mother: died @ 52 due to diabetes Father: died @ 72 MI  Social History: Reviewed history from 04/26/2008 and no changes required. On disability Alcohol Use - no Drug Use - no  Review of Systems       All systems reviewed.  Negative to the above problem  except as noted above.  Vital Signs:  Patient profile:   45 year old male Height:      73 inches Weight:      393 pounds Pulse rate:   82 / minute BP sitting:   115 / 65  (left arm) Cuff size:   large  Vitals Entered By: Oswald Hillock (May 28, 2009 2:13 PM)  Physical Exam  Additional Exam:  Patient is an obese 45 year old in NAD HEENT:  Normocephalic, atraumatic. EOMI, PERRLA. Question sl icteric  Neck: JVP is increased. No thyromegaly. No bruits.    Lungs: clear to auscultation. No rales no wheezes.  Heart: Regular rate and rhythm. Normal S1, S2. No S3.   No significant murmurs. PMI not displaced.  Abdomen:  distended.   Normal bowel sounds.   Extremities:   Good distal pulses throughout. 1+ lower extremity edema.  Musculoskeletal :moving all extremities.  Neuro:   alert and oriented x3.     ICD Specifications Following MD:  Lewayne Bunting, MD     ICD Vendor:  [facility] [employer]     ICD Model Number:  [id number]     ICD Serial Number:  [id number] ICD DOI:  09/08/2008     ICD Implanting MD:  Lewayne Bunting, MD  Lead 1:    Location: RV     DOI: 8295     Serial #: [id number]     Status: active  ICD Follow Up ICD Dependent:  No      Episodes Coumadin:  No  Brady Parameters Mode VVI     Lower Rate Limit:  40      Tachy Zones VF:  200     Impression & Recommendations:  Problem # 1:  CHF (ICD-428.0) Volume status does look up.  I want to check his labs today before making any changes.  [facility] [address]  Problem # 2:  HYPERTENSION (ICD-401.9)  Good control  His updated medication list for this problem includes:    Metoprolol Tartrate 100 Mg Tabs (Metoprolol tartrate) .Marland Kitchen... Take 1 tablet by mouth two times a day    Accupril 40 Mg Tabs (Quinapril hcl) .Marland Kitchen... Take 1  tablets by mouth once a day    Spironolactone 25 Mg Tabs (Spironolactone) .Marland Kitchen... Take 1 tablet by mouth once a day    Furosemide 80 Mg Tabs (Furosemide) .Marland Kitchen... Take two tablets by mouth twice a day  Problem # 3:  DIABETES MELLITUS, TYPE II (ICD-250.00) Will check HgbA1C Orders: TLB-A1C / Hgb A1C (Glycohemoglobin) (83036-A1C)  Problem # 4:  DYSLIPIDEMIA (ICD-272.4)  Check nonfasting lipids. His updated medication list for this problem includes:    Simvastatin 40 Mg Tabs (Simvastatin) .Marland Kitchen... Take 1 tablet by mouth once a day  Orders: TLB-Lipid Panel (80061-LIPID)  His updated medication list for this problem includes:    Simvastatin 40 Mg Tabs (Simvastatin) .Marland Kitchen... Take 1  tablet by mouth once a day  Other Orders: TLB-CBC Platelet - w/Differential (85025-CBCD) TLB-TSH (Thyroid Stimulating Hormone) (84443-TSH) TLB-BMP (Basic Metabolic Panel-BMET) (80048-METABOL) TLB-Hepatic/Liver Function Pnl (80076-HEPATIC)  Patient Instructions: 1)  Your physician recommends that you return for lab work in: lab work today 2)  Your physician recommends that you schedule a follow-up appointment in: 2 months

## 2010-02-26 NOTE — Miscellaneous (Signed)
Summary: Orders Update  Clinical Lists Changes  Orders: Added new Test order of TLB-BMP (Basic Metabolic Panel-BMET) (80048-METABOL) - Signed 

## 2010-02-26 NOTE — Letter (Signed)
Summary: Appointment - Missed  [facility], Main Office  [address]   [place], [place] [address]   Phone: [phone]  Fax: [phone]     May 08, 2009 MRN: [id number]   [name] [address] [place], [place]  [address]   Dear Mr. LAPINSKY,  Our records indicate you missed your appointment on 05/07/2009 with Dr. Tenny Craw. It is very important that we reach you to reschedule this appointment. We look forward to participating in your health care needs. Please contact us at the number listed above at your earliest convenience to reschedule this appointment.     Sincerely,   [facility] Scheduling Team

## 2010-02-26 NOTE — Progress Notes (Signed)
Summary: med refill/gp  Phone Note Refill Request Message from:  Fax from Pharmacy on March 19, 2009 11:18 AM  Refills Requested: Medication #1:  SIMVASTATIN 40 MG TABS Take 1 tablet by mouth once a day  Method Requested: Electronic Initial call taken by: Chinita Pester RN,  March 19, 2009 11:18 AM  Follow-up for Phone Call        Refill approved-nurse to complete    Prescriptions: SIMVASTATIN 40 MG TABS (SIMVASTATIN) Take 1 tablet by mouth once a day  #31 x 5   Entered and Authorized by:   Vassie Loll MD   Signed by:   Vassie Loll MD on 03/19/2009   Method used:   [facility]  [address] [id number]       [address]       [place], [place]  [address]       Ph: [phone]       Fax: [phone]   RxID:   [id number]

## 2010-02-26 NOTE — Assessment & Plan Note (Signed)
Summary: 2 month rov/sl   Visit Type:  Follow-up Primary Provider:  Vassie Loll MD   History of Present Illness: patient is a 45 year old with a history of NICM, hypertenision and renal insufficiency.  I saw him last in May.  This spring he had problems with significant volume overload.  Echo showed LVEF was 30%.  RVEF was also noted to be moderately reduced.   Since I saw him he has been seen by Casimiro Needle.  I do not have this last clinic note or labs from that office.  OVerall he is feeling good now, his fluid status is much improved.  Denies signif SOB.  No edema. He does admit to smoking a few cigs per day.  Current Medications (verified): 1)  Ativan 0.5 Mg Tabs (Lorazepam) .... Take 1 Tablet By Mouth Two Times A Day As Needed For Nerves 2)  Metoprolol Tartrate 100 Mg  Tabs (Metoprolol Tartrate) .... Take 1 Tablet By Mouth Two Times A Day 3)  Nexium 20 Mg  Cpdr (Esomeprazole Magnesium) .... Take 1 Tablet By Mouth Two Times A Day 4)  Accupril 40 Mg Tabs (Quinapril Hcl) .... Take 1  Tablets By Mouth Once A Day 5)  Metformin Hcl 1000 Mg Tabs (Metformin Hcl) .... Take 1 Tablet By Mouth Two Times A Day 6)  Lantus 100 Unit/ml Soln (Insulin Glargine) .... Inject 36 Units Subcutaneously Once At Night 7)  Spironolactone 25 Mg Tabs (Spironolactone) .... Take 1 Tablet By Mouth Once A Day 8)  Bd U/f Iii Mini Pen Needle 31g X 5 Mm  Misc (Insulin Pen Needle) 9)  Flonase 50 Mcg/act Susp (Fluticasone Propionate) .... 2 Spray in Each Nostril Daily. 10)  Unilet Excelite Ii   Misc (Lancets) 11)  Truetrack Test   Strp (Glucose Blood) 12)  Furosemide 80 Mg Tabs (Furosemide) .... Take Two Tablets By Mouth Twice A Day 13)  Simvastatin 40 Mg Tabs (Simvastatin) .... Take 1 Tablet By Mouth Once A Day 14)  Loratadine 10 Mg Tabs (Loratadine) .... Take 1 Tablet By Mouth Once A Day 15)  Potassium Chloride Crys Cr 20 Meq Cr-Tabs (Potassium Chloride Crys Cr) .... Two Times A Day  Allergies (verified): No Known  Drug Allergies  Past History:  Past medical, surgical, family and social histories (including risk factors) reviewed, and no changes noted (except as noted below).  Past Medical History: Reviewed history from 04/26/2008 and no changes required. DYSLIPIDEMIA (ICD-272.4) INSOMNIA UNSPECIFIED (ICD-780.52) OBSTRUCTIVE SLEEP APNEA (ICD-327.23) LUQ PAIN (ICD-789.02) ALLERGIC RHINITIS (ICD-477.9) CARDIOMYOPATHY (ICD-425.4) HYPERTENSION (ICD-401.9) DIABETES MELLITUS, TYPE II (ICD-250.00)     KIDNEY DISEASE, CHRONIC, STAGE I (ICD-585.1) GERD (ICD-530.81) MORBID OBESITY (ICD-278.01) DYSURIA (ICD-788.1) TOBACCO ABUSE (ICD-305.1)    Family History: Reviewed history from 04/26/2008 and no changes required. Mother: died @ 39 due to diabetes Father: died @ 42 MI  Social History: Reviewed history from 04/26/2008 and no changes required. On disability Alcohol Use - no Drug Use - no  Vital Signs:  Patient profile:   45 year old male Height:      73 inches Weight:      342 pounds BMI:     45.28 Pulse rate:   67 / minute BP sitting:   118 / 73  (left arm)  Vitals Entered By: Burnett Kanaris, CNA (September 24, 2009 4:04 PM)  Physical Exam  Additional Exam:  patient is in NAD. HEENT:  Normocephalic, atraumatic. EOMI, PERRLA.  Neck: JVP is normal. No thyromegaly. No bruits.  Lungs: clear  to auscultation. No rales no wheezes.  Heart: Regular rate and rhythm. Normal S1, S2. No S3.   No significant murmurs. PMI not displaced.  Abdomen:  Obese, supple, nontender. Normal bowel sounds. No masses. No hepatomegaly.  Extremities:   Good distal pulses throughout. No lower extremity edema.  Musculoskeletal :moving all extremities.  Neuro:   alert and oriented x3.     ICD Specifications Following MD:  Lewayne Bunting, MD     ICD Vendor:  [facility]     ICD Model Number:  ZO1096-04     ICD Serial Number:  [id number] ICD DOI:  09/08/2008     ICD Implanting MD:  Lewayne Bunting, MD  Lead 1:    Location: RV      DOI: 1915     Serial #: [id number]     Status: active  ICD Follow Up ICD Dependent:  No      Episodes Coumadin:  No  Brady Parameters Mode VVI     Lower Rate Limit:  40      Tachy Zones VF:  200     Impression & Recommendations:  Problem # 1:  CARDIOMYOPATHY (ICD-425.4) Volume status does look good today.  I need to get the notes from Dr. Roanna Banning office along with the labs. I have not had a chance to review the echo and compare to his previous.  RV function remains down.  Question if RHeart cath needed  Question sleep eval.    No change in medince for now.  Problem # 2:  KIDNEY DISEASE (ICD-593.9) Records from renal service.  Problem # 3:  DYSLIPIDEMIA (ICD-272.4) Continue. His updated medication list for this problem includes:    Simvastatin 40 Mg Tabs (Simvastatin) .Marland Kitchen... Take 1 tablet by mouth once a day  Problem # 4:  HYPERTENSION (ICD-401.9) Continue meds. Prescriptions: CIALIS 20 MG TABS (TADALAFIL) 1 tab as needed  #12 x 2   Entered by:   Layne Benton, RN, BSN   Authorized by:   Sherrill Raring, MD, [facility]   Signed by:   Layne Benton, RN, BSN on 09/24/2009   Method used:   [facility]        [facility] [address] [id number]       [address]       [place], [place]  [address]       Ph: [phone]       Fax: [phone]   RxID:   [id number]

## 2010-02-26 NOTE — Progress Notes (Signed)
Summary: refill/ hla  Phone Note Refill Request Message from:  Fax from Pharmacy on December 10, 2009 4:13 PM  Refills Requested: Medication #1:  ATIVAN 0.5 MG TABS Take 1 tablet by mouth two times a day as needed for nerves   Dosage confirmed as above?Dosage Confirmed   Last Refilled: 9/22 this pt HAS NOT been seen in over 11 months  Initial call taken by: Marin Roberts RN,  December 10, 2009 4:23 PM  Follow-up for Phone Call        Refill approved-nurse to complete. Will provide 1 refill; looking records he was seen by [facility] and during that appointment they also look at his DM and everything; make sure he has a followup appointment with [place], before he requires any further refills.   Thanks!!!!!    Prescriptions: ATIVAN 0.5 MG TABS (LORAZEPAM) Take 1 tablet by mouth two times a day as needed for nerves  #60 x 1   Entered and Authorized by:   Vassie Loll MD   Signed by:   Vassie Loll MD on 12/11/2009   Method used:   Telephoned to ...       [facility]  [address] [id number]       [address]       [place], [place]  [address]       Ph: [phone]       Fax: [phone]   RxID:   [id number]

## 2010-02-28 NOTE — Letter (Signed)
Summary: LOG BOOK REPORT  LOG BOOK REPORT   Imported By: Margie Billet 02/04/2010 11:12:47  _____________________________________________________________________  External Attachment:    Type:   Image     Comment:   External Document

## 2010-02-28 NOTE — Letter (Signed)
Summary: Device-Delinquent Check   [facility]  [address]   [place], [place] [address]   Phone: [phone]  Fax: [phone]     February 21, 2010 MRN: [id number]   Adam [name] [address] [place], [place]  [address]   Dear Adam [name],  According to our records, you have not had your implanted device checked in the recommended period of time.  We are unable to determine appropriate device function without checking your device on a regular basis.  Please call our office to schedule an appointment, with Dr Ladona Ridgel,  as soon as possible.  If you are having your device checked by another physician, please call us so that we may update our records.  Thank you,  Letta Moynahan, EMT  February 21, 2010 12:47 PM  [facility] certified

## 2010-02-28 NOTE — Progress Notes (Signed)
Summary: refill/gg  Phone Note Refill Request  on February 08, 2010 3:35 PM  ACCU chek compact plus meter, accu-chek lancets ( 100) and Accu-chek compact drum # 102  -   Method Requested: Electronic Initial call taken by: Merrie Roof RN,  February 08, 2010 3:35 PM  Follow-up for Phone Call        I will forward to [facility] for review. No record of prior meter? Follow-up by: Julaine Fusi  DO,  February 08, 2010 4:15 PM  Additional Follow-up for Phone Call Additional follow up Details #1::        He brought a meter in one time with 8 readings on it in 3 days. Suggest writeing the instructions for up to 1 time a day with limited refills. Would then ask him to follow this up with consistently having his meter at visits. Additional Follow-up by: Jamison Neighbor RD,CDE,  February 08, 2010 5:01 PM    New/Updated Medications: ACCU-CHEK COMPACT PLUS CARE  KIT (BLOOD GLUCOSE MONITORING SUPPL) use to check blood sugar up to 1 time a day ACCU-CHEK SOFTCLIX LANCETS  MISC (LANCETS) use to check blood sugar up to 1 time a day ACCU-CHEK COMPACT TEST DRUM  STRP (GLUCOSE BLOOD) use to check blood sugar up to 1 time a day Prescriptions: ACCU-CHEK COMPACT TEST DRUM  STRP (GLUCOSE BLOOD) use to check blood sugar up to 1 time a day  #51 x 1   Entered by:   Jamison Neighbor RD,CDE   Authorized by:   Bethel Born MD   Signed by:   Bethel Born MD on 02/09/2010   Method used:   [facility]        [facility]  [address] [phone][facility] (retail)       [address]       [place], [place]  [address]       Ph: [phone]       Fax: [phone]   RxID:   [id number] ACCU-CHEK SOFTCLIX LANCETS  MISC (LANCETS) use to check blood sugar up to 1 time a day  #100 x 1   Entered by:   Jamison Neighbor RD,CDE   Authorized by:   Bethel Born MD   Signed by:   Bethel Born MD on 02/09/2010   Method used:   [facility]ronically [facility]        [facility]  [address] [phone] ([facility]ail)       [address]  [place], [place]  [address]       Ph: [phone]       Fax: [phone]   RxID:   [id number] ACCU-CHEK COMPACT PLUS CARE  KIT (BLOOD GLUCOSE MONITORING SUPPL) use to check blood sugar up to 1 time a day  #1 x 0   Entered by:   Jamison Neighbor RD,CDE   Authorized by:   Bethel Born MD   Signed by:   Bethel Born MD on 02/09/2010   Method used:   [facility]        [facility]  [address] [phone][facility] (retail)       [address]       [place], [place]  [address]       Ph: [phone]       Fax: [phone]   RxID:   [id number]

## 2010-02-28 NOTE — Assessment & Plan Note (Signed)
Summary: cbg's hi- 500/pcp-devani/hla   Vital Signs:  Patient profile:   45 year old male Height:      73 inches (185.42 cm) Weight:      319.05 pounds (145.02 kg) BMI:     42.25 Temp:     98 degrees F (36.67 degrees C) oral Pulse rate:   80 / minute BP sitting:   120 / 66  (right arm) Cuff size:   large  Vitals Entered By: Angelina Ok RN (January 30, 2010 8:54 AM) Is Patient Diabetic? Yes Did you bring your meter with you today? Yes Pain Assessment Patient in pain? no      Nutritional Status BMI of > 30 = obese  Have you ever been in a relationship where you felt threatened, hurt or afraid?No   Does patient need assistance? Functional Status Self care Ambulation Normal Comments Elevated sugars. Needs refills on meds and meter strips.   Primary Care Provider:  Vassie Loll MD   History of Present Illness: 45 yr old man with pmhx as described below comes to the clinic for diabetes management. Patient reports that he increased his lantus to 46 units at bedtime. He has been doing this for about one week and reports that morning blood sugars are around 300. Patient denies hypoglycemic episodes. Admits that he has not been eating healthy during the holidays.  Patient also reports that he is getting over a cold. He still has some nasal drainage which he would like some medication for.   Depression History:      The patient denies a depressed mood most of the day and a diminished interest in his usual daily activities.         Preventive Screening-Counseling & Management  Alcohol-Tobacco     Alcohol drinks/day: 0     Smoking Status: current     Smoking Cessation Counseling: yes     Packs/Day: 2 cigs daily     Year Started: age 23 yrs old     Year Quit: stopped then restarted     Passive Smoke Exposure: no  Comments: Has cut back to 2 per day.  Problems Prior to Update: 1)  Shortness of Breath  (ICD-786.05) 2)  Hypopotassemia  (ICD-276.8) 3)  Kidney Disease   (ICD-593.9) 4)  CHF  (ICD-428.0) 5)  Dizziness  (ICD-780.4) 6)  Anxiety  (ICD-300.00) 7)  Cardiomyopathy  (ICD-425.4) 8)  Hypertension  (ICD-401.9) 9)  Diabetes Mellitus, Type II  (ICD-250.00) 10)  Kidney Disease, Chronic, Stage I  (ICD-585.1) 11)  Dyslipidemia  (ICD-272.4) 12)  Insomnia Unspecified  (ICD-780.52) 13)  Obstructive Sleep Apnea  (ICD-327.23) 14)  Luq Pain  (ICD-789.02) 15)  Allergic Rhinitis  (ICD-477.9) 16)  Gerd  (ICD-530.81) 17)  Morbid Obesity  (ICD-278.01) 18)  Dysuria  (ICD-788.1) 19)  Tobacco Abuse  (ICD-305.1)  Medications Prior to Update: 1)  Ativan 0.5 Mg Tabs (Lorazepam) .... Take 1 Tablet By Mouth Two Times A Day As Needed For Nerves 2)  Metoprolol Tartrate 100 Mg  Tabs (Metoprolol Tartrate) .... Take 1 Tablet By Mouth Two Times A Day 3)  Nexium 20 Mg  Cpdr (Esomeprazole Magnesium) .... Take 1 Tablet By Mouth Two Times A Day 4)  Accupril 40 Mg Tabs (Quinapril Hcl) .... Take 1  Tablets By Mouth Once A Day 5)  Metformin Hcl 1000 Mg Tabs (Metformin Hcl) .... Take 1 Tablet By Mouth Two Times A Day 6)  Lantus 100 Unit/ml Soln (Insulin Glargine) .... Inject 36 Units Subcutaneously  Once At Night 7)  Spironolactone 25 Mg Tabs (Spironolactone) .... Take 1 Tablet By Mouth Once A Day 8)  Bd U/f Iii Mini Pen Needle 31g X 5 Mm  Misc (Insulin Pen Needle) 9)  Flonase 50 Mcg/act Susp (Fluticasone Propionate) .... 2 Spray in Each Nostril Daily. 10)  Unilet Excelite Ii   Misc (Lancets) 11)  Truetrack Test   Strp (Glucose Blood) 12)  Furosemide 80 Mg Tabs (Furosemide) .... Take Two Tablets By Mouth Twice A Day 13)  Simvastatin 40 Mg Tabs (Simvastatin) .... Take 1 Tablet By Mouth Once A Day 14)  Loratadine 10 Mg Tabs (Loratadine) .... Take 1 Tablet By Mouth Once A Day 15)  Potassium Chloride Crys Cr 20 Meq Cr-Tabs (Potassium Chloride Crys Cr) .... Two Times A Day 16)  Cialis 20 Mg Tabs (Tadalafil) .Marland Kitchen.. 1 Tab As Needed  Current Medications (verified): 1)  Ativan 0.5 Mg Tabs  (Lorazepam) .... Take 1 Tablet By Mouth Two Times A Day As Needed For Nerves 2)  Metoprolol Tartrate 100 Mg  Tabs (Metoprolol Tartrate) .... Take 1 Tablet By Mouth Two Times A Day 3)  Nexium 20 Mg  Cpdr (Esomeprazole Magnesium) .... Take 1 Tablet By Mouth Two Times A Day 4)  Accupril 40 Mg Tabs (Quinapril Hcl) .... Take 1  Tablets By Mouth Once A Day 5)  Metformin Hcl 1000 Mg Tabs (Metformin Hcl) .... Take 1 Tablet By Mouth Two Times A Day 6)  Lantus 100 Unit/ml Soln (Insulin Glargine) .... Inject 46 Units Subcutaneously Once At Night 7)  Spironolactone 25 Mg Tabs (Spironolactone) .... Take 1 Tablet By Mouth Once A Day 8)  Bd U/f Iii Mini Pen Needle 31g X 5 Mm  Misc (Insulin Pen Needle) 9)  Flonase 50 Mcg/act Susp (Fluticasone Propionate) .... 2 Spray in Each Nostril Daily. 10)  Unilet Excelite Ii   Misc (Lancets) 11)  Truetrack Test   Strp (Glucose Blood) 12)  Furosemide 80 Mg Tabs (Furosemide) .... Take Two Tablets By Mouth Twice A Day 13)  Simvastatin 40 Mg Tabs (Simvastatin) .... Take 1 Tablet By Mouth Once A Day 14)  Loratadine 10 Mg Tabs (Loratadine) .... Take 1 Tablet By Mouth Once A Day 15)  Potassium Chloride Crys Cr 20 Meq Cr-Tabs (Potassium Chloride Crys Cr) .... Two Times A Day 16)  Cialis 20 Mg Tabs (Tadalafil) .Marland Kitchen.. 1 Tab As Needed  Allergies: No Known Drug Allergies  Past History:  Past Medical History: Last updated: May 23, 2008 DYSLIPIDEMIA (ICD-272.4) INSOMNIA UNSPECIFIED (ICD-780.52) OBSTRUCTIVE SLEEP APNEA (ICD-327.23) LUQ PAIN (ICD-789.02) ALLERGIC RHINITIS (ICD-477.9) CARDIOMYOPATHY (ICD-425.4) HYPERTENSION (ICD-401.9) DIABETES MELLITUS, TYPE II (ICD-250.00)     KIDNEY DISEASE, CHRONIC, STAGE I (ICD-585.1) GERD (ICD-530.81) MORBID OBESITY (ICD-278.01) DYSURIA (ICD-788.1) TOBACCO ABUSE (ICD-305.1)    Family History: Last updated: May 23, 2008 Mother: died @ 63 due to diabetes Father: died @ 6 MI  Social History: Last updated: 23-May-2008 On  disability Alcohol Use - no Drug Use - no  Risk Factors: Alcohol Use: 0 (01/30/2010) Exercise: yes (02/27/2009)  Risk Factors: Smoking Status: current (01/30/2010) Packs/Day: 2 cigs daily (01/30/2010) Passive Smoke Exposure: no (01/30/2010)  Family History: Reviewed history from 23-May-2008 and no changes required. Mother: died @ 45 due to diabetes Father: died @ 10 MI  Social History: Reviewed history from 2008-05-23 and no changes required. On disability Alcohol Use - no Drug Use - no Packs/Day:  2 cigs daily  Review of Systems  The patient denies fever, chest pain, syncope, dyspnea on exertion, peripheral edema, prolonged  cough, headaches, hemoptysis, abdominal pain, melena, hematochezia, severe indigestion/heartburn, hematuria, muscle weakness, difficulty walking, and unusual weight change.    Physical Exam  General:  NAD, vitals reviewed Mouth:  pharynx pink and moist and postnasal drip.   Neck:  supple and full ROM.   Lungs:  Normal respiratory effort, chest expands symmetrically. Lungs are clear to auscultation, no crackles or wheezes. Heart:  regular rate and rhythm. S1, S2. No S3. No murmurs Abdomen:  obese, soft, non-tender, normal bowel sounds, no guarding, and no rigidity.   Msk:  Back normal, normal gait. Muscle strength and tone normal. Extremities:  trace edema bilaterally. Neurologic:  alert & oriented X3, cranial nerves II-XII intact, strength normal in all extremities, sensation intact to light touch, and gait normal.    Diabetes Management Exam:    Foot Exam (with socks and/or shoes not present):       Sensory-Monofilament:          Left foot: normal          Right foot: normal   Impression & Recommendations:  Problem # 1:  DIABETES MELLITUS, TYPE II (ICD-250.00) Uncontrolled. Will increase Lantus and instructed patient to complete a 2 week log in which he will check his sugars fasting and after dinner. Will evaluate log and reassess on follow up.  Check microalbumin/creatinine ratio, although patient already on ace. Referr for Diabetic Eye exam. Foot exam done today.  His updated medication list for this problem includes:    Accupril 40 Mg Tabs (Quinapril hcl) .Marland Kitchen... Take 1  tablets by mouth once a day    Metformin Hcl 1000 Mg Tabs (Metformin hcl) .Marland Kitchen... Take 1 tablet by mouth two times a day    Lantus 100 Unit/ml Soln (Insulin glargine) ..... Inject 55 units subcutaneously once at night  Orders: T- Capillary Blood Glucose ([address]) T-Hgb A1C (in-house) (60454UJ) Ophthalmology Referral (Ophthalmology) T-Urine Microalbumin w/creat. ratio [phone])  Problem # 2:  HYPERTENSION (ICD-401.9) At goal. Continue current regimen.   His updated medication list for this problem includes:    Metoprolol Tartrate 100 Mg Tabs (Metoprolol tartrate) .Marland Kitchen... Take 1 tablet by mouth two times a day    Accupril 40 Mg Tabs (Quinapril hcl) .Marland Kitchen... Take 1  tablets by mouth once a day    Spironolactone 25 Mg Tabs (Spironolactone) .Marland Kitchen... Take 1 tablet by mouth once a day    Furosemide 80 Mg Tabs (Furosemide) .Marland Kitchen... Take two tablets by mouth twice a day  BP today: 120/66 Prior BP: 118/73 (09/24/2009)  Labs Reviewed: K+: 4.0 (06/07/2009) Creat: : 1.7 (06/07/2009)   Chol: 74 (05/28/2009)   HDL: 20.20 (05/28/2009)   LDL: 43 (05/28/2009)   TG: 53.0 (05/28/2009)  Problem # 3:  DYSLIPIDEMIA (ICD-272.4) At goal. Continue current regimen.  His updated medication list for this problem includes:    Simvastatin 40 Mg Tabs (Simvastatin) .Marland Kitchen... Take 1 tablet by mouth once a day  Labs Reviewed: SGOT: 26 (05/28/2009)   SGPT: 10 (05/28/2009)   HDL:20.20 (05/28/2009), 16.9 (04/06/2008)  LDL:43 (05/28/2009), 68 (04/06/2008)  Chol:74 (05/28/2009), 96 (04/06/2008)  Trig:53.0 (05/28/2009), 56 (04/06/2008)  Problem # 4:  KIDNEY DISEASE, CHRONIC, STAGE I (ICD-585.1) Recheck renal function on follow up.  Labs Reviewed: BUN: 37 (06/07/2009)   Cr: 1.7 (06/07/2009)     Hgb: 10.3 (05/28/2009)   Hct: 30.6 (05/28/2009)   Ca++: 9.3 (06/07/2009)    TP: 8.0 (05/28/2009)   Alb: 3.5 (05/28/2009)  Problem # 5:  CHF (ICD-428.0) Stable. No signs of volume overload.  Continue current regimen.  His updated medication list for this problem includes:    Metoprolol Tartrate 100 Mg Tabs (Metoprolol tartrate) .Marland Kitchen... Take 1 tablet by mouth two times a day    Accupril 40 Mg Tabs (Quinapril hcl) .Marland Kitchen... Take 1  tablets by mouth once a day    Spironolactone 25 Mg Tabs (Spironolactone) .Marland Kitchen... Take 1 tablet by mouth once a day    Furosemide 80 Mg Tabs (Furosemide) .Marland Kitchen... Take two tablets by mouth twice a day  Complete Medication List: 1)  Ativan 0.5 Mg Tabs (Lorazepam) .... Take 1 tablet by mouth two times a day as needed for nerves 2)  Metoprolol Tartrate 100 Mg Tabs (Metoprolol tartrate) .... Take 1 tablet by mouth two times a day 3)  Nexium 20 Mg Cpdr (Esomeprazole magnesium) .... Take 1 tablet by mouth two times a day 4)  Accupril 40 Mg Tabs (Quinapril hcl) .... Take 1  tablets by mouth once a day 5)  Metformin Hcl 1000 Mg Tabs (Metformin hcl) .... Take 1 tablet by mouth two times a day 6)  Lantus 100 Unit/ml Soln (Insulin glargine) .... Inject 55 units subcutaneously once at night 7)  Spironolactone 25 Mg Tabs (Spironolactone) .... Take 1 tablet by mouth once a day 8)  Bd U/f Iii Mini Pen Needle 31g X 5 Mm Misc (Insulin pen needle) 9)  Flonase 50 Mcg/act Susp (Fluticasone propionate) .... 2 spray in each nostril daily. 10)  Unilet Excelite Ii Misc (Lancets) 11)  Truetrack Test Strp (Glucose blood) 12)  Furosemide 80 Mg Tabs (Furosemide) .... Take two tablets by mouth twice a day 13)  Simvastatin 40 Mg Tabs (Simvastatin) .... Take 1 tablet by mouth once a day 14)  Loratadine 10 Mg Tabs (Loratadine) .... Take 1 tablet by mouth once a day 15)  Potassium Chloride Crys Cr 20 Meq Cr-tabs (Potassium chloride crys cr) .... Two times a day 16)  Cialis 20 Mg Tabs (Tadalafil) .Marland Kitchen.. 1 tab  as needed  Patient Instructions: 1)  Please schedule a follow-up appointment in 2 weeks. 2)  Check bloog sugars level once before breakfast and after dinner for the next two weeks. 3)  Take all medication as directed. Prescriptions: CIALIS 20 MG TABS (TADALAFIL) 1 tab as needed  #2 x 6   Entered and Authorized by:   Laren Everts MD   Signed by:   Laren Everts MD on 01/30/2010   Method used:   Electronically to        [id number] (retail)       [address]       [place], [place]  [address]       Ph: [phone]       Fax: [phone]   RxID:   [id number] POTASSIUM CHLORIDE CRYS CR 20 MEQ CR-TABS (POTASSIUM CHLORIDE CRYS CR) two times a day  #60 x 3   Entered and Authorized by:   Laren Everts MD   Signed by:   Laren Everts MD on 01/30/2010   Method used:   Electronically to        [id number] (retail)       [address]       [place], [place]  [address]       Ph: [phone]       Fax: [phone]   RxID:   [id number] LORATADINE 10  MG TABS (LORATADINE) Take 1 tablet by mouth once a day  #31 x 3   Entered and Authorized by:   Laren Everts MD   Signed by:   Laren Everts MD on 01/30/2010   Method used:   Electronically to        [id number] (retail)       [address]       [place], [place]  [address]       Ph: [phone]       Fax: [phone]   RxID:   [id number] SIMVASTATIN 40 MG TABS (SIMVASTATIN) Take 1 tablet by mouth once a day  #30 x 2   Entered and Authorized by:   Laren Everts MD   Signed by:   Laren Everts MD on 01/30/2010   Method used:   Electronically to        [id number] ([facility]ail)       [address]       [place], [place]  [address]       Ph: [phone]       Fax: [phone]   RxID:    [id number] FUROSEMIDE 80 MG TABS (FUROSEMIDE) take two tablets by mouth twice a day  #120 x 3   Entered and Authorized by:   Laren Everts MD   Signed by:   Laren Everts MD on 01/30/2010   Method used:   Electronically to        [id number] ([facility])       [address]       [place], [place]  [address]       Ph: [phone]       Fax: [phone]   RxID:   [id number] FLONASE 50 MCG/ACT SUSP (FLUTICASONE PROPIONATE) 2 Spray in each nostril daily.  #1 x 3   Entered and Authorized by:   Laren Everts MD   Signed by:   Laren Everts MD on 01/30/2010   Method used:   Electronically to        [facility] [phone][facility] (retail)       [address]       [place], [place]  [address]       Ph: [phone]       Fax: [phone]   RxID:   [id number] SPIRONOLACTONE 25 MG TABS (SPIRONOLACTONE) Take 1 tablet by mouth once a day  #60 x 3   Entered and Authorized by:   Laren Everts MD   Signed by:   Laren Everts MD on 01/30/2010   Method used:   Electronically to        [id number] ([facility]       [address]       [place], [place]  [address]       Ph: [phone]       Fax: [phone]   RxID:   [id number] LANTUS 100 UNIT/ML SOLN (INSULIN GLARGINE) Inject 46 units subcutaneously once at night  #1vial x 6   Entered and Authorized by:   Laren Everts MD   Signed by:   Laren Everts MD on 01/30/2010   Method used:   Electronically to  [id number] ([facility])       [address]       [place], [place]  [address]       Ph: [phone]       Fax: [phone]   RxID:   [id number] METFORMIN HCL 1000 MG TABS (METFORMIN HCL) Take 1 tablet by mouth two times a day  #62 x 2   Entered and Authorized by:   Laren Everts MD   Signed by:   Laren Everts MD on 01/30/2010   Method used:   Electronically to        [id number]       [address]       [place], [place]  [address]       Ph: [phone]       Fax: [phone]   RxID:   [id number] ACCUPRIL 40 MG TABS (QUINAPRIL HCL) Take 1  tablets by mouth once a day  #30 x 10   Entered and Authorized by:   Laren Everts MD   Signed by:   Laren Everts MD on 01/30/2010   Method used:   Electronically to        [id number] ([facility]       [address]       [place], [place]  [address]       Ph: [phone]       Fax: [phone]   RxID:   [id number] NEXIUM 20 MG  CPDR (ESOMEPRAZOLE MAGNESIUM) Take 1 tablet by mouth two times a day  #60 Capsule x 2   Entered and Authorized by:   Laren Everts MD   Signed by:   Laren Everts MD on 01/30/2010   Method used:   Electronically to        [id number]       [address]       [place], [place]  [address]       Ph: [phone]       Fax: [phone]   RxID:   [id number] METOPROLOL TARTRATE 100 MG  TABS (METOPROLOL TARTRATE) Take 1 tablet by mouth two times a day  #62 x 6   Entered and Authorized by:   Laren Everts MD   Signed by:   Laren Everts MD on 01/30/2010   Method used:   Electronically to        [id number]       [address]       [place], [place]  [address]       Ph: [phone]       Fax: [phone]   RxID:   [id number]    Orders Added: 1)  T- Capillary Blood Glucose [82948] 2)  T-Hgb A1C (in-house) [09735HG] 3)  Ophthalmology Referral [Ophthalmology] 4)  T-Urine Microalbumin w/creat. ratio [[id number]] 5)  Est. Patient Level III [[address]]    Prevention & Chronic Care Immunizations   Influenza vaccine: Not documented   Influenza vaccine deferral:  Refused  (01/01/2009)    Tetanus booster: Not documented   Td booster deferral: Refused  (01/01/2009)    Pneumococcal vaccine: Not documented   Pneumococcal  vaccine deferral: Deferred  (01/01/2009)  Other Screening   PSA: Not documented   Smoking status: current  (01/30/2010)   Smoking cessation counseling: yes  (01/30/2010)  Diabetes Mellitus   HgbA1C: 11.9  (01/30/2010)    Eye exam: Not documented   Diabetic eye exam action/deferral: Ophthalmology referral  (01/30/2010)    Foot exam: yes  (01/30/2010)   Foot exam action/deferral: Do today   High risk foot: No  (01/30/2010)   Foot care education: Done  (01/01/2009)    Urine microalbumin/creatinine ratio: 110.9  (07/05/2007)   Urine microalbumin action/deferral: Ordered    Diabetes flowsheet reviewed?: Yes   Progress toward A1C goal: Deteriorated  Lipids   Total Cholesterol: 74  (05/28/2009)   LDL: 43  (05/28/2009)   LDL Direct: Not documented   HDL: 20.20  (05/28/2009)   Triglycerides: 53.0  (05/28/2009)    SGOT (AST): 26  (05/28/2009)   SGPT (ALT): 10  (05/28/2009)   Alkaline phosphatase: 80  (05/28/2009)   Total bilirubin: 3.8  (05/28/2009)    Lipid flowsheet reviewed?: Yes   Progress toward LDL goal: At goal  Hypertension   Last Blood Pressure: 120 / 66  (01/30/2010)   Serum creatinine: 1.7  (06/07/2009)   Serum potassium 4.0  (06/07/2009)    Hypertension flowsheet reviewed?: Yes   Progress toward BP goal: At goal  Self-Management Support :   Personal Goals (by the next clinic visit) :     Personal A1C goal: 6  (01/01/2009)     Personal blood pressure goal: 130/80  (01/01/2009)     Personal LDL goal: 70  (01/01/2009)    Patient will work on the following items until the next clinic visit to reach self-care goals:     Medications and monitoring: take my medicines every day, check my blood sugar, bring all of my medications to every visit, examine my feet every day  (01/30/2010)     Eating: drink diet  soda or water instead of juice or soda, eat more vegetables, use fresh or frozen vegetables, eat foods that are low in salt, eat baked foods instead of fried foods, eat fruit for snacks and desserts, limit or avoid alcohol  (01/30/2010)     Activity: take a 30 minute walk every day  (01/30/2010)     Home glucose monitoring frequency: 1 time daily  (01/01/2009)    Diabetes self-management support: Copy of home glucose meter record, CBG self-monitoring log, Written self-care plan, Education handout, Resources for patients handout  (01/30/2010)   Diabetes care plan printed   Diabetes education handout printed    Hypertension self-management support: Written self-care plan, Education handout, Pre-printed educational material, Resources for patients handout  (01/30/2010)   Hypertension self-care plan printed.   Hypertension education handout printed    Lipid self-management support: Written self-care plan, Education handout, Pre-printed educational material, Resources for patients handout  (01/30/2010)   Lipid self-care plan printed.   Lipid education handout printed      Resource handout printed.   Nursing Instructions: Refer for screening diabetic eye exam (see order)     Last LDL:                                                 43 (05/28/2009 2:45:24 PM)        Diabetic Foot Exam Last Podiatry Exam Date: 01/01/2009 Foot  Inspection Is there a history of a foot ulcer?              No Is there a foot ulcer now?              No Can the patient see the bottom of their feet?          Yes Are the shoes appropriate in style and fit?          Yes Is there swelling or an abnormal foot shape?          No Are the toenails long?                Yes Are the toenails thick?                Yes Are the toenails ingrown?              No Is there heavy callous build-up?              Yes Is there a claw toe deformity?                          No Is there elevated skin temperature?             No Is there limited ankle dorsiflexion?            No Is there foot or ankle muscle weakness?            No Do you have pain in calf while walking?           No      Comments: On heels and bottom of great  toe on right foot.  Dry feet. Callus build up on balls of feet.  Dryness on both feet High Risk Feet? No   10-g (5.07) Semmes-Weinstein Monofilament Test Performed by: Angelina Ok RN          Right Foot          Left Foot Visual Inspection               Test Control      normal         normal Site 1         normal         normal Site 2         normal         normal Site 3         normal         normal Site 4         normal         normal Site 5         normal         normal Site 6         normal         normal Site 7         normal         normal Site 8         normal         normal Site 9         normal         normal Site 10         normal         normal  Impression      normal         normal  Laboratory Results   Blood Tests   Date/Time Received: January 30, 2010 9:13 AM  Date/Time Reported: Burke Keels  January 30, 2010 9:13 AM   HGBA1C: 11.9%   (Normal Range: Non-Diabetic - 3-6%   Control Diabetic - 6-8%) CBG Fasting:: 251mg /dL      Vital Signs:  Patient profile:   45 year old male Height:      73 inches (185.42 cm) Weight:      319.05 pounds (145.02 kg) BMI:     42.25 Temp:     98 degrees F (36.67 degrees C) oral Pulse rate:   80 / minute BP sitting:   120 / 66  (right arm) Cuff size:   large  Vitals Entered By: Angelina Ok RN (January 30, 2010 8:54 AM)  Process Orders Check Orders Results:     [facility]: ABN not required for this insurance Tests Sent for requisitioning (January 30, 2010 11:18 AM):     01/30/2010: [facility] -- T-Urine Microalbumin w/creat. ratio [[id number]] (signed)

## 2010-02-28 NOTE — Assessment & Plan Note (Signed)
Summary: CHECKUP/NEW TO DR.   Vital Signs:  Patient profile:   45 year old male Height:      73 inches (185.42 cm) Weight:      319.4 pounds (145.18 kg) BMI:     42.29 Temp:     97.5 degrees F (36.39 degrees C) oral Pulse rate:   80 / minute BP sitting:   123 / 74  (right arm) Cuff size:   large  Vitals Entered By: Cynda Familia Duncan Dull) (February 13, 2010 8:40 AM) CC: routine f/u Is Patient Diabetic? Yes Did you bring your meter with you today? No Pain Assessment Patient in pain? no      Nutritional Status BMI of > 30 = obese  Have you ever been in a relationship where you felt threatened, hurt or afraid?No   Does patient need assistance? Functional Status Self care Ambulation Normal   Primary Care Provider:  Bethel Born MD  CC:  routine f/u.  History of Present Illness: 45 y/o m with h/o DM, HTN, HLD, NICM comes for follow up  He was seen recently for checkup and his HbA1C was 11 while his usual HbA1C have beetween 5-6 before. He was asked to increase lantus to 55 units from previous 36 units. He has not had any episdoes of hypoglycemia like symtpoms since then. He has not been checking his CBGs at home in last 2 weeks because he has not been able to get the new accu check supplies.   no other complaints today  Preventive Screening-Counseling & Management  Alcohol-Tobacco     Alcohol drinks/day: 0     Smoking Status: current     Smoking Cessation Counseling: yes     Packs/Day: 2 cigs daily     Year Started: age 77 yrs old     Year Quit: stopped then restarted     Passive Smoke Exposure: no  Current Medications (verified): 1)  Ativan 0.5 Mg Tabs (Lorazepam) .... Take 1 Tablet By Mouth Two Times A Day As Needed For Nerves 2)  Metoprolol Tartrate 100 Mg  Tabs (Metoprolol Tartrate) .... Take 1 Tablet By Mouth Two Times A Day 3)  Nexium 20 Mg  Cpdr (Esomeprazole Magnesium) .... Take 1 Tablet By Mouth Two Times A Day 4)  Accupril 40 Mg Tabs (Quinapril Hcl) ....  Take 1  Tablets By Mouth Once A Day 5)  Metformin Hcl 1000 Mg Tabs (Metformin Hcl) .... Take 1 Tablet By Mouth Two Times A Day 6)  Lantus 100 Unit/ml Soln (Insulin Glargine) .... Inject 55 Units Subcutaneously Once At Night 7)  Spironolactone 25 Mg Tabs (Spironolactone) .... Take 1 Tablet By Mouth Once A Day 8)  Bd U/f Iii Mini Pen Needle 31g X 5 Mm  Misc (Insulin Pen Needle) 9)  Flonase 50 Mcg/act Susp (Fluticasone Propionate) .... 2 Spray in Each Nostril Daily. 10)  Furosemide 80 Mg Tabs (Furosemide) .... Take Two Tablets By Mouth Twice A Day 11)  Simvastatin 40 Mg Tabs (Simvastatin) .... Take 1 Tablet By Mouth Once A Day 12)  Loratadine 10 Mg Tabs (Loratadine) .... Take 1 Tablet By Mouth Once A Day 13)  Potassium Chloride Crys Cr 20 Meq Cr-Tabs (Potassium Chloride Crys Cr) .... Two Times A Day 14)  Cialis 20 Mg Tabs (Tadalafil) .Marland Kitchen.. 1 Tab As Needed 15)  Accu-Chek Compact Plus Care  Kit (Blood Glucose Monitoring Suppl) .... Use To Check Blood Sugar Up To 1 Time A Day 16)  Accu-Chek Softclix Lancets  Misc (Lancets) .Marland KitchenMarland KitchenMarland Kitchen  Use To Check Blood Sugar Up To 1 Time A Day 17)  Accu-Chek Compact Test Drum  Strp (Glucose Blood) .... Use To Check Blood Sugar Up To 1 Time A Day  Allergies (verified): No Known Drug Allergies  Review of Systems  The patient denies anorexia, fever, weight loss, weight gain, vision loss, decreased hearing, hoarseness, chest pain, syncope, dyspnea on exertion, peripheral edema, prolonged cough, headaches, hemoptysis, abdominal pain, melena, hematochezia, severe indigestion/heartburn, hematuria, incontinence, genital sores, muscle weakness, suspicious skin lesions, transient blindness, difficulty walking, depression, unusual weight change, abnormal bleeding, enlarged lymph nodes, angioedema, breast masses, and testicular masses.    Physical Exam  General:  Gen: VS reveiwed, Alert, well developed, nodistress ENT: mucous membranes pink & moist. No abnormal finds in ear and  nose. CVC:S1 S2 , no murmurs, no abnormal heart sounds. Lungs: Clear to auscultation B/L. No wheezes, crackles or other abnormal sounds Abdomen: soft, non distended, no tender. Normal Bowel sounds EXT: no pitting edema, no engorged veins, Pulsations normal  Neuro:alert, oriented *3, cranial nerved 2-12 intact, strenght normal in all  extremities, senstations normal to light touch.      Impression & Recommendations:  Problem # 1:  DIABETES MELLITUS, TYPE II (ICD-250.00)  His CBGs were running in 400-500 before last week and HbA1c was >11. He was asked to increase his lantus to 55 units from 36 units no hypoglycemia after that has not been checking CBGs since then as he did not have the accu-check supplies saw hi eye doctor yesterday  plan will continue current regimen, make sure he gets his test supplies, call him back in 2 weeks for f-up to look at meter readings.  recheck HbA1c as last one was isolated high number from previous numbers  His updated medication list for this problem includes:    Accupril 40 Mg Tabs (Quinapril hcl) .Marland Kitchen... Take 1  tablets by mouth once a day    Metformin Hcl 1000 Mg Tabs (Metformin hcl) .Marland Kitchen... Take 1 tablet by mouth two times a day    Lantus 100 Unit/ml Soln (Insulin glargine) ..... Inject 55 units subcutaneously once at night  Labs Reviewed: Creat: 1.7 (06/07/2009)    Reviewed HgBA1c results: 11.9 (01/30/2010)  4.8 (05/28/2009)  Orders: T-Hgb A1C (in-house) (32951OA)  Complete Medication List: 1)  Ativan 0.5 Mg Tabs (Lorazepam) .... Take 1 tablet by mouth two times a day as needed for nerves 2)  Metoprolol Tartrate 100 Mg Tabs (Metoprolol tartrate) .... Take 1 tablet by mouth two times a day 3)  Nexium 20 Mg Cpdr (Esomeprazole magnesium) .... Take 1 tablet by mouth two times a day 4)  Accupril 40 Mg Tabs (Quinapril hcl) .... Take 1  tablets by mouth once a day 5)  Metformin Hcl 1000 Mg Tabs (Metformin hcl) .... Take 1 tablet by mouth two times a  day 6)  Lantus 100 Unit/ml Soln (Insulin glargine) .... Inject 55 units subcutaneously once at night 7)  Spironolactone 25 Mg Tabs (Spironolactone) .... Take 1 tablet by mouth once a day 8)  Bd U/f Iii Mini Pen Needle 31g X 5 Mm Misc (Insulin pen needle) 9)  Flonase 50 Mcg/act Susp (Fluticasone propionate) .... 2 spray in each nostril daily. 10)  Furosemide 80 Mg Tabs (Furosemide) .... Take two tablets by mouth twice a day 11)  Simvastatin 40 Mg Tabs (Simvastatin) .... Take 1 tablet by mouth once a day 12)  Loratadine 10 Mg Tabs (Loratadine) .... Take 1 tablet by mouth once a day 13)  Potassium Chloride Crys Cr 20 Meq Cr-tabs (Potassium chloride crys cr) .... Two times a day 14)  Cialis 20 Mg Tabs (Tadalafil) .Marland Kitchen.. 1 tab as needed 15)  Accu-chek Compact Plus Care Kit (Blood glucose monitoring suppl) .... Use to check blood sugar up to 1 time a day 16)  Accu-chek Softclix Lancets Misc (Lancets) .... Use to check blood sugar up to 1 time a day 17)  Accu-chek Compact Test Drum Strp (Glucose blood) .... Use to check blood sugar up to 1 time a day  Patient Instructions: 1)  Please schedule a follow-up appointment in 2 weeks. Bring meter and all your medicines at next visit.  2)  Tobacco is very bad for your health and your loved ones! You Should stop smoking!. Prescriptions: LANTUS 100 UNIT/ML SOLN (INSULIN GLARGINE) Inject 55 units subcutaneously once at night  #3 monht supp x 3   Entered and Authorized by:   Bethel Born MD   Signed by:   Bethel Born MD on 02/13/2010   Method used:   [facility]  [address] [phone][facility] ([facility]ail)       [address]       [place], [place]  [address]       Ph: [phone]       Fax: [phone]   RxID:   [id number]    Orders Added: 1)  Est. Patient Level III [24401] 2)  T-Hgb A1C (in-house) [02725DG]     Laboratory Results   Blood Tests   Date/Time Received: February 13, 2010 9:46 AM  Date/Time  Reported: Alric Quan  February 13, 2010 9:46 AM   HGBA1C: 12.1%   (Normal Range: Non-Diabetic - 3-6%   Control Diabetic - 6-8%)

## 2010-02-28 NOTE — Progress Notes (Signed)
Summary: refill/gg  Phone Note Refill Request  on February 01, 2010 11:58 AM  Refills Requested: Medication #1:  TRUETRACK TEST   STRP  Method Requested: Electronic Initial call taken by: Merrie Roof RN,  February 01, 2010 11:58 AM    New/Updated Medications: TRUETRACK TEST  STRP (GLUCOSE BLOOD) Use as directed Prescriptions: TRUETRACK TEST  STRP (GLUCOSE BLOOD) Use as directed  #100 x 6   Entered and Authorized by:   Laren Everts MD   Signed by:   Laren Everts MD on 02/01/2010   Method used:   [facility]        [facility]  [address] [id number]       [address]       [place], [place]  [address]       Ph: [phone]       Fax: [phone]   RxID:   [id number]

## 2010-02-28 NOTE — Progress Notes (Signed)
Summary: insulin/ hla  Phone Note Call from Patient   Summary of Call: pt called stating he could not get his insulin, [facility] states he needed a "different PA" as she looked for a ph# she discovered the tech at [facility] put med thru wrong, she resubmitted it and it went thru, she is calling pt now to apologize and have him pickup insulin. Initial call taken by: Marin Roberts RN,  February 15, 2010 11:46 AM

## 2010-02-28 NOTE — Progress Notes (Signed)
Summary: Medicaid approved lantus solostar  Phone Note Outgoing Call   Call placed by: Stanton Kidney Twala Collings RN,  February 19, 2010 1:54 PM Call placed to: [place]. Summary of Call: Talked with Medicaid [phone] about rejection on Lantus solostar - states has been approved. [facility] with [phone] and aware of above. States pt has picked up med.

## 2010-02-28 NOTE — Progress Notes (Signed)
Summary: refill/gg  Phone Note Refill Request  on January 07, 2010 3:38 PM  Refills Requested: Medication #1:  METFORMIN HCL 1000 MG TABS Take 1 tablet by mouth two times a day   Last Refilled: 12/05/2009  Method Requested: Electronic Initial call taken by: Merrie Roof RN,  January 07, 2010 3:38 PM  Follow-up for Phone Call        Refill approved-nurse to complete    Prescriptions: METFORMIN HCL 1000 MG TABS (METFORMIN HCL) Take 1 tablet by mouth two times a day  #62 x 2   Entered and Authorized by:   Vassie Loll MD   Signed by:   Vassie Loll MD on 01/07/2010   Method used:   [facility]        [facility]  [address] [id number]       [address]       [place], [place]  [address]       Ph: [phone]       Fax: [phone]   RxID:   [id number]

## 2010-03-06 NOTE — Assessment & Plan Note (Signed)
Summary: EST-2 WK RECHECK/CH   Vital Signs:  Patient profile:   45 year old male Height:      73 inches Weight:      328.1 pounds BMI:     43.44 Temp:     97.0 degrees F oral Pulse rate:   69 / minute BP sitting:   121 / 74  (right arm)  Vitals Entered By: Filomena Jungling NT II (February 26, 2010 4:08 PM) CC: followupvisit Is Patient Diabetic? Yes Did you bring your meter with you today? No  Does patient need assistance? Functional Status Self care Ambulation Normal   Primary Care Provider:  Bethel Born MD  CC:  followupvisit.  History of Present Illness: 46 Y/O m with DM which is not very well controlled comes for follow up  I saw him 2 week ago at which time his HbA1C had jumped to 11 from usual <7. He has been fairly complaint with his lantus. I asked him to retrun in 2 weeks with his meter so I  cannot look at it at increase his insulin, but he has not brought his meter today. He says he check its infrequently at home  He complaints of runny nose and sinus congestion, this has been going since 3 days, he denies fever, SOB, cough, sick contacts  or other complaints.     Current Medications (verified): 1)  Ativan 0.5 Mg Tabs (Lorazepam) .... Take 1 Tablet By Mouth Two Times A Day As Needed For Nerves 2)  Metoprolol Tartrate 100 Mg  Tabs (Metoprolol Tartrate) .... Take 1 Tablet By Mouth Two Times A Day 3)  Nexium 20 Mg  Cpdr (Esomeprazole Magnesium) .... Take 1 Tablet By Mouth Two Times A Day 4)  Accupril 40 Mg Tabs (Quinapril Hcl) .... Take 1  Tablets By Mouth Once A Day 5)  Metformin Hcl 1000 Mg Tabs (Metformin Hcl) .... Take 1 Tablet By Mouth Two Times A Day 6)  Lantus 100 Unit/ml Soln (Insulin Glargine) .... Inject 55 Units Subcutaneously Once At Night 7)  Spironolactone 25 Mg Tabs (Spironolactone) .... Take 1 Tablet By Mouth Once A Day 8)  Bd U/f Iii Mini Pen Needle 31g X 5 Mm  Misc (Insulin Pen Needle) 9)  Flonase 50 Mcg/act Susp (Fluticasone Propionate) .... 2 Spray  in Each Nostril Daily. 10)  Furosemide 80 Mg Tabs (Furosemide) .... Take Two Tablets By Mouth Twice A Day 11)  Simvastatin 40 Mg Tabs (Simvastatin) .... Take 1 Tablet By Mouth Once A Day 12)  Loratadine 10 Mg Tabs (Loratadine) .... Take 1 Tablet By Mouth Once A Day 13)  Potassium Chloride Crys Cr 20 Meq Cr-Tabs (Potassium Chloride Crys Cr) .... Two Times A Day 14)  Cialis 20 Mg Tabs (Tadalafil) .Marland Kitchen.. 1 Tab As Needed 15)  Accu-Chek Compact Plus Care  Kit (Blood Glucose Monitoring Suppl) .... Use To Check Blood Sugar Up To 1 Time A Day 16)  Accu-Chek Softclix Lancets  Misc (Lancets) .... Use To Check Blood Sugar Up To 1 Time A Day 17)  Accu-Chek Compact Test Drum  Strp (Glucose Blood) .... Use To Check Blood Sugar Up To 1 Time A Day 18)  Saline Nasal Spray 0.65 % Soln (Saline) .Marland Kitchen.. 1 Spray in Both Nostril 4-5 Times A Day and Before Using Your Flonase.  Allergies (verified): No Known Drug Allergies  Review of Systems  The patient denies anorexia, fever, weight loss, weight gain, vision loss, decreased hearing, hoarseness, chest pain, syncope, dyspnea on exertion, peripheral edema,  prolonged cough, headaches, hemoptysis, abdominal pain, melena, hematochezia, severe indigestion/heartburn, hematuria, incontinence, genital sores, muscle weakness, suspicious skin lesions, transient blindness, difficulty walking, depression, unusual weight change, abnormal bleeding, enlarged lymph nodes, angioedema, breast masses, and testicular masses.    Physical Exam  General:  Gen: VS reveiwed, Alert, well developed, nodistress ENT: mucous membranes pink & moist. No abnormal finds in ear and nose. CVC:S1 S2 , no murmurs, no abnormal heart sounds. Lungs: Clear to auscultation B/L. No wheezes, crackles or other abnormal sounds Abdomen: soft, non distended, no tender. Normal Bowel sounds EXT: no pitting edema, no engorged veins, Pulsations normal  Neuro:alert, oriented *3, cranial nerved 2-12 intact, strenght normal  in all  extremities, senstations normal to light touch.      Impression & Recommendations:  Problem # 1:  DIABETES MELLITUS, TYPE II (ICD-250.00) HE has not checked his sugars in last 2 weeks as frquently as I asked him to and he has not brought his meter today. He says he is compliant with his lantus 55 units and deneies hypoglycemia like symptoms. I will ask him to come back in 3-4 weeks with his meter. WIll ask him to check his sugars before meals and at bedtime. I called his pharmacy and conformed that he has brought his last prescribed lantus. Will also shcedule an appointment with Lupita Leash at next appointment.   His updated medication list for this problem includes:    Accupril 40 Mg Tabs (Quinapril hcl) .Marland Kitchen... Take 1  tablets by mouth once a day    Metformin Hcl 1000 Mg Tabs (Metformin hcl) .Marland Kitchen... Take 1 tablet by mouth two times a day    Lantus 100 Unit/ml Soln (Insulin glargine) ..... Inject 55 units subcutaneously once at night  Problem # 2:  ALLERGIC RHINITIS (ICD-477.9) he seemed to be having another episdoe of allergic rhinitis ( seasonal) will continue loratadine, will ask him to use nasal saline spray before flonase sprays and RTc if clinic not better in 1 week for course of antibiotic.   The following medications were removed from the medication list:    Zyrtec Allergy 10 Mg Tabs (Cetirizine hcl) .Marland Kitchen... Take 1 tablet by mouth once a day His updated medication list for this problem includes:    Flonase 50 Mcg/act Susp (Fluticasone propionate) .Marland Kitchen... 2 spray in each nostril daily.    Loratadine 10 Mg Tabs (Loratadine) .Marland Kitchen... Take 1 tablet by mouth once a day    Saline Nasal Spray 0.65 % Soln (Saline) .Marland Kitchen... 1 spray in both nostril 4-5 times a day and before using your flonase.  Complete Medication List: 1)  Ativan 0.5 Mg Tabs (Lorazepam) .... Take 1 tablet by mouth two times a day as needed for nerves 2)  Metoprolol Tartrate 100 Mg Tabs (Metoprolol tartrate) .... Take 1 tablet by  mouth two times a day 3)  Nexium 20 Mg Cpdr (Esomeprazole magnesium) .... Take 1 tablet by mouth two times a day 4)  Accupril 40 Mg Tabs (Quinapril hcl) .... Take 1  tablets by mouth once a day 5)  Metformin Hcl 1000 Mg Tabs (Metformin hcl) .... Take 1 tablet by mouth two times a day 6)  Lantus 100 Unit/ml Soln (Insulin glargine) .... Inject 55 units subcutaneously once at night 7)  Spironolactone 25 Mg Tabs (Spironolactone) .... Take 1 tablet by mouth once a day 8)  Bd U/f Iii Mini Pen Needle 31g X 5 Mm Misc (Insulin pen needle) 9)  Flonase 50 Mcg/act Susp (Fluticasone propionate) .... 2 spray  in each nostril daily. 10)  Furosemide 80 Mg Tabs (Furosemide) .... Take two tablets by mouth twice a day 11)  Simvastatin 40 Mg Tabs (Simvastatin) .... Take 1 tablet by mouth once a day 12)  Loratadine 10 Mg Tabs (Loratadine) .... Take 1 tablet by mouth once a day 13)  Potassium Chloride Crys Cr 20 Meq Cr-tabs (Potassium chloride crys cr) .... Two times a day 14)  Cialis 20 Mg Tabs (Tadalafil) .Marland Kitchen.. 1 tab as needed 15)  Accu-chek Compact Plus Care Kit (Blood glucose monitoring suppl) .... Use to check blood sugar up to 1 time a day 16)  Accu-chek Softclix Lancets Misc (Lancets) .... Use to check blood sugar up to 1 time a day 17)  Accu-chek Compact Test Drum Strp (Glucose blood) .... Use to check blood sugar up to 1 time a day 18)  Saline Nasal Spray 0.65 % Soln (Saline) .Marland Kitchen.. 1 spray in both nostril 4-5 times a day and before using your flonase.  Patient Instructions: 1)  Please schedule a follow-up appointment in 1 month. Check bloos sugars 4 times a day , before each meals and bring your meter with you. 2)  Schedule an appointment with Jamison Neighbor on the same day Prescriptions: SALINE NASAL SPRAY 0.65 % SOLN (SALINE) 1 spray in both nostril 4-5 times a day and before using your flonase.  #1 x prn   Entered and Authorized by:   Bethel Born MD   Signed by:   Bethel Born MD on 02/26/2010   Method  used:   [facility]  [address] [phone][facility] ([facility]ail)       [address]       [place], [place]  [address]       Ph: [phone]       Fax: [phone]   RxID:   [id number] ZYRTEC ALLERGY 10 MG TABS (CETIRIZINE HCL) Take 1 tablet by mouth once a day  #31 x 6   Entered and Authorized by:   Bethel Born MD   Signed by:   Bethel Born MD on 02/26/2010   Method used:   [facility]        [facility]  [address] [phone][facility] (retail)       [address]       [place], [place]  [address]       Ph: [phone]       Fax: [phone]   RxID:   [id number]    Orders Added: 1)  Est. Patient Level IV [32202]

## 2010-03-25 ENCOUNTER — Encounter: Payer: Self-pay | Admitting: Internal Medicine

## 2010-03-27 ENCOUNTER — Encounter: Payer: Self-pay | Admitting: Internal Medicine

## 2010-04-02 ENCOUNTER — Other Ambulatory Visit: Payer: Self-pay | Admitting: *Deleted

## 2010-04-02 MED ORDER — LORAZEPAM 0.5 MG PO TABS: 0.5000 mg | ORAL_TABLET | Freq: Two times a day (BID) | ORAL | Status: DC

## 2010-04-16 NOTE — Cardiovascular Report (Signed)
Summary: Certified Letter Returned - Not doing f/u  Certified Letter Returned - Not doing f/u   Imported By: Debby Freiberg 04/08/2010 10:05:01  _____________________________________________________________________  External Attachment:    Type:   Image     Comment:   External Document

## 2010-04-17 LAB — GLUCOSE, CAPILLARY: Glucose-Capillary: 130 mg/dL — ABNORMAL HIGH (ref 70–99)

## 2010-04-30 LAB — GLUCOSE, CAPILLARY: Glucose-Capillary: 143 mg/dL — ABNORMAL HIGH (ref 70–99)

## 2010-05-04 LAB — BASIC METABOLIC PANEL
BUN: 12 mg/dL (ref 6–23)
GFR calc non Af Amer: >60 mL/min (ref >60)
Potassium: 3.6 mEq/L (ref 3.5–5.1)
Sodium: 140 mEq/L (ref 135–145)

## 2010-05-04 LAB — GLUCOSE, CAPILLARY
Glucose-Capillary: 116 mg/dL — ABNORMAL HIGH (ref 70–99)
Glucose-Capillary: 156 mg/dL — ABNORMAL HIGH (ref 70–99)
Glucose-Capillary: 85 mg/dL (ref 70–99)

## 2010-05-14 ENCOUNTER — Other Ambulatory Visit: Payer: Self-pay | Admitting: Internal Medicine

## 2010-05-15 ENCOUNTER — Other Ambulatory Visit: Payer: Self-pay | Admitting: *Deleted

## 2010-05-15 NOTE — Telephone Encounter (Signed)
Yes, but he missed his last visit with Dr London Sheer to keep the upcoming appointment

## 2010-05-15 NOTE — Telephone Encounter (Signed)
Pt is out of meds, can you refill ? 

## 2010-05-15 NOTE — Telephone Encounter (Signed)
Refill has been done.

## 2010-05-21 ENCOUNTER — Other Ambulatory Visit: Payer: Self-pay | Admitting: Internal Medicine

## 2010-05-22 ENCOUNTER — Other Ambulatory Visit: Payer: Self-pay | Admitting: *Deleted

## 2010-05-22 MED ORDER — LORAZEPAM 0.5 MG PO TABS: 0.5000 mg | ORAL_TABLET | Freq: Two times a day (BID) | ORAL | Status: DC | PRN

## 2010-05-22 NOTE — Telephone Encounter (Signed)
[facility] 

## 2010-05-23 ENCOUNTER — Encounter: Payer: Self-pay | Admitting: Internal Medicine

## 2010-05-23 ENCOUNTER — Ambulatory Visit (INDEPENDENT_AMBULATORY_CARE_PROVIDER_SITE_OTHER): Payer: Medicaid Other | Admitting: Internal Medicine

## 2010-05-23 DIAGNOSIS — D649 Anemia, unspecified: Secondary | ICD-10-CM

## 2010-05-23 DIAGNOSIS — E119 Type 2 diabetes mellitus without complications: Secondary | ICD-10-CM

## 2010-05-23 DIAGNOSIS — F419 Anxiety disorder, unspecified: Secondary | ICD-10-CM

## 2010-05-23 DIAGNOSIS — I10 Essential (primary) hypertension: Secondary | ICD-10-CM

## 2010-05-23 DIAGNOSIS — F411 Generalized anxiety disorder: Secondary | ICD-10-CM

## 2010-05-23 DIAGNOSIS — E785 Hyperlipidemia, unspecified: Secondary | ICD-10-CM

## 2010-05-23 DIAGNOSIS — J302 Other seasonal allergic rhinitis: Secondary | ICD-10-CM

## 2010-05-23 DIAGNOSIS — K219 Gastro-esophageal reflux disease without esophagitis: Secondary | ICD-10-CM

## 2010-05-23 DIAGNOSIS — N529 Male erectile dysfunction, unspecified: Secondary | ICD-10-CM

## 2010-05-23 DIAGNOSIS — N289 Disorder of kidney and ureter, unspecified: Secondary | ICD-10-CM

## 2010-05-23 DIAGNOSIS — J309 Allergic rhinitis, unspecified: Secondary | ICD-10-CM

## 2010-05-23 LAB — FERRITIN: Ferritin: 26 ng/mL (ref 22–322)

## 2010-05-23 LAB — CBC
HCT: 33.4 % — ABNORMAL LOW (ref 39.0–52.0)
Hemoglobin: 10 g/dL — ABNORMAL LOW (ref 13.0–17.0)
MCH: 27.8 pg (ref 26.0–34.0)
MCHC: 29.9 g/dL — ABNORMAL LOW (ref 30.0–36.0)
MCV: 92.8 fL (ref 78.0–100.0)

## 2010-05-23 MED ORDER — LORAZEPAM 0.5 MG PO TABS: 0.5000 mg | ORAL_TABLET | Freq: Two times a day (BID) | ORAL | Status: DC | PRN

## 2010-05-23 MED ORDER — SIMVASTATIN 40 MG PO TABS: 40.0000 mg | ORAL_TABLET | Freq: Every day | ORAL | Status: DC

## 2010-05-23 MED ORDER — TADALAFIL 20 MG PO TABS: 20.0000 mg | ORAL_TABLET | ORAL | Status: DC | PRN

## 2010-05-23 MED ORDER — ESOMEPRAZOLE MAGNESIUM 20 MG PO CPDR: 20.0000 mg | DELAYED_RELEASE_CAPSULE | Freq: Every day | ORAL | Status: DC

## 2010-05-23 MED ORDER — LORATADINE 10 MG PO TABS: 10.0000 mg | ORAL_TABLET | Freq: Every day | ORAL | Status: DC

## 2010-05-23 MED ORDER — METOPROLOL TARTRATE 100 MG PO TABS: 100.0000 mg | ORAL_TABLET | Freq: Two times a day (BID) | ORAL | Status: DC

## 2010-05-23 MED ORDER — FLUTICASONE PROPIONATE 50 MCG/ACT NA SUSP: 2.0000 | Freq: Every day | NASAL | Status: DC

## 2010-05-23 MED ORDER — INSULIN GLARGINE 100 UNIT/ML ~~LOC~~ SOLN: 50.0000 [IU] | Freq: Every day | SUBCUTANEOUS | Status: DC

## 2010-05-23 MED ORDER — QUINAPRIL HCL 40 MG PO TABS: 40.0000 mg | ORAL_TABLET | Freq: Every day | ORAL | Status: DC

## 2010-05-23 MED ORDER — SPIRONOLACTONE 25 MG PO TABS: 25.0000 mg | ORAL_TABLET | Freq: Every day | ORAL | Status: DC

## 2010-05-23 MED ORDER — FUROSEMIDE 80 MG PO TABS: 160.0000 mg | ORAL_TABLET | Freq: Two times a day (BID) | ORAL | Status: DC

## 2010-05-23 NOTE — Assessment & Plan Note (Addendum)
Patient's A1c is improved to 8.3, he is on Lantus 42 units at bedtime, his sugars consistently run between 170-210. I will increase his Lantus to 50 units. Note the patient has chronic kidney disease with a baseline creatinine of 1.7-2, and should not be on metformin, I discussed this with the patient, he refuses to stop this medication and would like to discuss it first with his primary care physician. Patient understands the risk of continuing taking metformin with chronic kidney disease.

## 2010-05-23 NOTE — Assessment & Plan Note (Signed)
Hemoglobin in 2008 was 15.1, hemoglobin 2010 was 13.5, hemoglobin in 2011 was 10.3, patient denies GI bleed, today we'll check CBC and an anemia panel to try and determine if this was from GI losses versus chronic disease. We'll see the patient Hemoccult cards to send back to [place], if positive patient will need GI referral for further workup for GI bleed.

## 2010-05-23 NOTE — Progress Notes (Signed)
  Subjective:    Patient ID: Adam [name], male    DOB: 02/07/1965, 45 y.o.   MRN: [id number]  HPI  Patient is a 45 year old male, with a past medical history of chronic kidney disease with baseline creatinine of 1.7-2, type 2 diabetes with an A1c of 8.3, hypertension, CHF, hyperlipidemia, and anxiety. Presents to the outpatient clinic for three-month diabetic followup, he brought in his meter today and shows a sugars from 170-210, no lows recorded. He says that taking 42 units of Lantus at bedtime along with metformin 1000 twice a day. Patient reports no new complaints and is compliant with the medications. Lab review I noticed that the patient's hemoglobin has been trending down from 15.1 in 2008 to 10.3 in 2011. Patient has not noticed any symptoms of lower GI bleed. Denies any chest pain, shortness of breath, nausea, vomiting, or weight loss.   Review of Systems  All other systems reviewed and are negative.       Objective:   Physical Exam  Nursing note and vitals reviewed. Constitutional: He is oriented to person, place, and time. He appears well-developed and well-nourished.  HENT:  Head: Normocephalic and atraumatic.  Eyes: Pupils are equal, round, and reactive to light.  Neck: Normal range of motion. No JVD present. No thyromegaly present.  Cardiovascular: Normal rate, regular rhythm and normal heart sounds.   Pulmonary/Chest: Effort normal and breath sounds normal. He has no wheezes. He has no rales.  Abdominal: Soft. Bowel sounds are normal. There is no tenderness. There is no rebound.  Musculoskeletal: Normal range of motion. He exhibits no edema.  Neurological: He is alert and oriented to person, place, and time.  Skin: Skin is warm and dry.          Assessment & Plan:

## 2010-05-23 NOTE — Assessment & Plan Note (Signed)
Patient's baseline creatinine is 1.7-2, will check creatinine today and continue to monitor. Patient will eventually need referral to a renal physician

## 2010-05-23 NOTE — Assessment & Plan Note (Signed)
Well controlled on current treatment, No new changes made today, Will continue to monitor.   

## 2010-05-24 LAB — COMPLETE METABOLIC PANEL WITH GFR
ALT: <8 U/L (ref 0–53)
AST: 11 U/L (ref 0–37)
Alkaline Phosphatase: 100 U/L (ref 39–117)
CO2: 22 mEq/L (ref 19–32)
GFR, Est African American: 50 mL/min — ABNORMAL LOW (ref >60)
Sodium: 141 mEq/L (ref 135–145)
Total Bilirubin: 1.2 mg/dL (ref 0.3–1.2)
Total Protein: 7.5 g/dL (ref 6.0–8.3)

## 2010-05-24 LAB — IRON AND TIBC: UIBC: 428 ug/dL

## 2010-05-27 ENCOUNTER — Other Ambulatory Visit: Payer: Self-pay | Admitting: Internal Medicine

## 2010-06-06 ENCOUNTER — Other Ambulatory Visit: Payer: Self-pay | Admitting: *Deleted

## 2010-06-07 MED ORDER — INSULIN PEN NEEDLE 31G X 5 MM MISC: Status: DC

## 2010-06-11 NOTE — Assessment & Plan Note (Signed)
[facility]                            CARDIOLOGY OFFICE NOTE   NAME:Adam, [name]                           MRN:          [id number]  DATE:01/19/2008                            DOB:          05-Apr-1965    Adam [name] was seen today as an add-on to Doc of the Day.  He was  complaining of increasing lower extremity edema.  He last saw Dr. Tenny Craw,  I believe in October 2009.  He has nonischemic cardiomyopathy.  The  patient's chart was not available, so I do not have an ejection fraction  on him, although his last note indicated that he has history of lower  extremity edema when she last saw him in October, she increased his  Lasix to b.i.d. for few days.  His BMP has been elevated.  His potassium  tends to be okay.  He continues to have significant lower extremity  edema.  He eats quite a bit and does not add lot of salt to his diet,  but clearly has salt from his eating out all the time.  He does indicate  that the legs improve when he lays down at night.   He has significant central obesity, diabetes, hypercholesterolemia, and  hypertension.   He also continues to smoke.  When I talked him about this at length, he  says he only has two cigarettes a day, but I suspect it is more.   From a cardiac perspective.  He is not having any palpitations, syncope.  He is not having PND, orthopnea, as far as I can tell he has not been  checked for sleep apnea, but certainly probably has it.  His review of  systems otherwise negative.   ALLERGIES:  He has no known allergies.   MEDICATIONS:  On Accupril 40 a day, metformin 1 g b.i.d., glipizide 10  b.i.d., Lantus as directed, Aldactone 25 a day, Toprol 100 b.i.d.,  Nexium, and Lasix 80 day.   PHYSICAL EXAMINATION:  GENERAL:  Remarkable for an overweight black male  in no distress.  She does have nicotine on his breath.  VITAL SIGNS:  His blood pressure is 130/85, pulse 76 and regular,  respiratory rate 14,  afebrile, weight 369.  HEENT:  Unremarkable.  Carotids normal without bruit.  No  lymphadenopathy, thyromegaly, JVP elevation.  LUNGS:  Clear.  Good diaphragmatic motion.  No wheezing.  S1, S2.  Distal heart sounds.  PMI normal.  ABDOMEN:  Benign.  Bowel sounds positive.  No AAA, no tenderness, no  bruit, no hepatosplenomegaly, hepatojugular reflux, or tenderness.  Distal pulses are intact.  He has +2 to 3 edema bilaterally which is  brawny and chronic.   IMPRESSION:  1. Cardiomyopathy with volume overload, increase Lasix to 80 b.i.d.,      increase Aldactone to 50 a day.  He can ever followup BMET and BMP      when he sees Dr. Tenny Craw in 2-3 weeks.  Continue to cut back on salt      intake.  Elevate legs at the end of the day.  2. Cardiomyopathy.  The patient is on reasonable dose of beta-blocker      and Accupril.  His dependent edema is likely also related to his      central obesity as opposed to just his cardiomyopathy, continue      current medications.  3. Diabetes.  Check hemoglobin A1c quarterly.  I suspect his Lantus      dose is quite high given his insulin resistance and obesity.  He      will continue his metformin and glipizide.  I would try to avoid      any Actos given his current volume overload.   The patient will see Dr. Tenny Craw back in 2-3 weeks.     Adam [name]. Eden Emms, MD, [facility]  Electronically Signed    PCN/MedQ  DD: 01/19/2008  DT: 01/20/2008  Job #: [id number]

## 2010-06-11 NOTE — Discharge Summary (Signed)
NAMESKYELAR, SWIGART                  ACCOUNT NO.:  [id number]   MEDICAL RECORD NO.:  [id number]          PATIENT TYPE:  INP   LOCATION:  4731                         FACILITY:  [facility]   PHYSICIAN:  Madolyn Frieze. Jens Som, MD, FACCDATE OF BIRTH:  02/15/1965   DATE OF ADMISSION:  09/08/2008  DATE OF DISCHARGE:  09/09/2008                               DISCHARGE SUMMARY   DISCHARGE DIAGNOSIS:  Status post implantation of an implantable  cardioverter-defibrillator.   The patient has a St. Jude device implanted by Dr. Lewayne Bunting in the  setting of nonischemic cardiomyopathy and a history of nonsustained V-  TACH.   PAST MEDICAL HISTORY:  Also includes hypertension, dyslipidemia, and  diabetes.   Mr. Gracey was seen in the office on July 20, 2008, for evaluation of  cardiomyopathy with EF of 25%.  The patient is followed by Dr. Dietrich Pates.  Dr. Ladona Ridgel felt the patient would benefit from prophylactic ICD  implant with class II to III symptoms, CHF.  The patient was scheduled  for implant as stated above, tolerated the procedure without  complications.  Chest x-ray obtained post ICD implant negative for  pneumothorax.  The patient is stable to be discharged home.  He has been  given the post ICD instructions.  He will follow up in the [facility]  for wound check, we will call him with appointment and also follow up  with Dr. Ladona Ridgel and Dr. Tenny Craw.   He may return to work on September 18, 2008.   MEDICATIONS AT TIME OF DISCHARGE:  1. Accupril 40 mg b.i.d.  2. Ativan 0.5 mg p.r.n.  3. Glipizide 10 mg b.i.d.  4. Lantus 36 units at bedtime.  5. Lasix 80 mg 2 tablets by mouth daily.  6. Metformin 1000 mg b.i.d.  7. Metoprolol tartrate 100 mg b.i.d.  8. Nexium 20 mg b.i.d.  9. Potassium 20 mEq b.i.d.  10.Simvastatin 20 mg, the patient takes it twice a day.  11.Spironolactone 25 mg daily.  12.Veramyst spray nasally daily.   DURATION OF DISCHARGE ENCOUNTER:  Less than 30 minutes.      Dorian Pod, ACNP      Madolyn Frieze. Jens Som, MD, [facility]  Electronically Signed    MB/MEDQ  D:  09/09/2008  T:  09/09/2008  Job:  [id number]

## 2010-06-11 NOTE — Assessment & Plan Note (Signed)
[facility]                            CARDIOLOGY OFFICE NOTE   NAME:[facility] [name], Adam [name]                           MRN:          [id number]  DATE:11/26/2007                            DOB:          Apr 26, 1965    IDENTIFICATION:  The patient is a 45 year old gentleman with a  nonischemic cardiomyopathy, hypertension, dyslipidemia, morbid obesity,  and diabetes.  There has been a question of noncompliance in the past.   I saw him on October 28, 2007, when his volume was increased.  I checked  a BNP and BMET at time, indeed this was elevated.  I told him to  increase Lasix to 60, potassium was okay, I told him to add an extra  banana.  Followup BMET and BNP, k was 3.5, BNP was better at 445.  The  patient now says he is taking Lasix 80 a.m.  He actually had called back  and still had swelling.  We recommended 80 a.m. and 80 p.m. for a couple  days and then 80 a.m.   Again, he is feeling better.  Denies chest pain.  Appetite good.   Current medicines include:  1. Ativan 0.5 p.r.n.  2. Accupril 40.  3. Metformin 1 g b.i.d.  4. Glipizide 10 b.i.d.  5. Lantus as directed.  6. Aldactone 25.  7. Toprol-XL 100 b.i.d.  8. Tricor 145.  9. Nexium 20 b.i.d.  10.Veramyst.  11.Lasix 80 a.m.   PHYSICAL EXAMINATION:  GENERAL:  The patient is in no distress.  VITAL SIGNS:  Blood pressure 114/70 pulse 76, weight 352 down from 363.  NECK:  No visible JVD.  LUNGS:  Clear.  CARDIAC:  Regular rate and rhythm, S1 and S2.  No S3.  No significant  murmurs.  ABDOMEN:  Obese, benign.  EXTREMITIES:  Trivial edema.   IMPRESSION:  1. Cardiomyopathy.  Volume status looks much better.  We will repeat      labs today.  Continue on current medicines for now.  2. Hypertension.  This is the best I have seen it.  Keep him on the      same regimen.  3. Dyslipidemia.  Fasting lipids today.  4. Genitourinary.  The patient given a prescription for his request      for Viagra.  He is  not on any medicines that would contraindicate.      He is sexually active now.   Otherwise, I will set followup for 4-5 months, sooner if problems  develop.     Pricilla Riffle, MD, [facility]  Electronically Signed    PVR/MedQ  DD: 11/26/2007  DT: 11/27/2007  Job #: [id number]

## 2010-06-11 NOTE — Assessment & Plan Note (Signed)
[facility]                                 ON-CALL NOTE   NAME:NEALArvon, Schreiner                           MRN:          [id number]  DATE:02/08/2008                            DOB:          August 08, 1965    PRIMARY CARDIOLOGIST:  Pricilla Riffle, MD, [facility]   I was contacted by Mr Huzaifa this evening because he had run out of a  medication and wanted to refill.  Upon questioning him, I found that he  has been taking metoprolol 50 mg b.i.d. as well as Toprol-XL 100 mg  b.i.d.  Upon review of the office notes, the Toprol 100 b.i.d. is  listed, but not the 50.  He states he has been out of it a couple of  days and the prescription has no refills.  I have left a message with  the office for them to contact him to review with medications and make  sure that it is indeed okay for him to take the 100s and the 50s twice a  day.  I advised the patient that the office would contact him regarding  his medications and if that is indeed the most appropriate dose, they  will refill the prescription.  Mr. Houp is not having any problems or  issues at this time.      Theodore Demark, PA-C  Electronically Signed      Duke Salvia, MD, [facility]  Electronically Signed   RB/MedQ  DD: 02/08/2008  DT: 02/09/2008  Job #: [id number]

## 2010-06-11 NOTE — Assessment & Plan Note (Signed)
 [facility]                            CARDIOLOGY OFFICE NOTE   NAME:Talton, KEATH                           MRN:          [id number]  DATE:02/01/2007                            DOB:          09/17/1965    PATIENT IDENTIFICATION:  Mr. Urbanek is a 45 year old gentleman with a  nonischemic cardiomyopathy and hypertension as well as morbid obesity. I  last saw him in clinic back in December.   At that time, he appeared to be volume overloaded. He had already  actually increased his Lasix to 1.5 tablets daily. I checked labs which  showed an elevated BNP of 819. Since that time, he has continued because  of good urine output. Repeat labs on December 18, BNP was 675,  creatinine 1.3 and recommended he continue on this.   He has done this and feels his volume has improved. He still has some  ankle swelling.   CURRENT MEDICATIONS:  1. Ativan 0.5 p.r.n.  2. Lasix 40, 1-1/2 tablets daily.  3. Accupril 40 daily.  4. Metformin 1 gram b.i.d.  5. Glipizide 10 b.i.d.  6. Lantus as directed.  7. Spironolactone 25 daily.  8. Toprol XL 100 b.i.d.  9. Tricor 145.  10.Nexium 20 b.i.d.  11.Veramyst 27.5 mcg daily.   PHYSICAL EXAMINATION:  The patient is in no distress.  Blood pressure is 147/94, improved from last visit. Pulse is 74, weight  361, again down from 376.  LUNGS:  Clear without rales.  CARDIAC:  Regular rate and rhythm. No S3.  ABDOMEN:  Benign, obese.  EXTREMITIES:  Trace edema.   Cardiomyopathy. Volume is improved. He apparently still has some extra  fluid. I think his best weight is around 350 for a dry weight. I would  continue on current regimen. Will check labs today.   The patient has been reluctant to add medicines to his regimen. I told  him we would reassess and if blood pressure is still up would go up on  his regimen when he comes in to add BiDil and also consider increase  aldactone at that time. Again there has been some question as  to his  compliance on a the large amount of medicines that he is on. Question if  patient can read. He says he is taking all of his medicines. He does  appear to be taking the Lasix as directed.   The patient was counseled on smoking. Again he is smoking a few  cigarettes per day which is a fairly new habit for him. Nobody smokes  near him. Followup in 6-8 weeks' time, sooner if problems develop.     Pricilla Riffle, MD, [facility]  Electronically Signed    PVR/MedQ  DD: 02/01/2007  DT: 02/01/2007  Job #: [id number]

## 2010-06-11 NOTE — Op Note (Signed)
Adam [name], Adam [name]                  ACCOUNT NO.:  [id number]   MEDICAL RECORD NO.:  [id number]          PATIENT TYPE:  OBV   LOCATION:  4731                         FACILITY:  [facility]   PHYSICIAN:  Doylene Canning. Ladona Ridgel, MD    DATE OF BIRTH:  07-May-1965   DATE OF PROCEDURE:  09/08/2008  DATE OF DISCHARGE:  09/09/2008                               OPERATIVE REPORT   PROCEDURE REFORMED:  Insertion of a single chamber defibrillator.   INTRODUCTION:  The patient is a 45 year old man with a history of  dilated cardiomyopathy, longstanding, EF 25%, class II congestive heart  failure who is despite maximal medical therapy and is now referred for  prophylactic ICD insertion.   PROCEDURE:  After informed consent was obtained, the patient was taken  to the diagnostic EP Lab in the fasting state.  After usual preparation  and draping, intravenous fentanyl and midazolam were given for sedation.  A 30 mL of lidocaine was infiltrated in the left infraclavicular region.  A 7-cm incision was carried out over this region.  Electrocautery was  utilized to dissect down to the fascial plane.  The left subclavian vein  was subsequently punctured and the St. Jude Durata model 7121 75-cm  active fixation defibrillation lead, serial number [id number] was advanced  into the right ventricle.  Mapping was carried out.  The R-waves  measured 8 mV.  The pacing impedance was 700 ohms, threshold 0.5 volts  at 0.5 milliseconds.  With active fixation of the lead, there was a  large injury current noted.  With these satisfactory parameters, the  lead was secured to the subpectoralis fascia with a figure-of-8 silk  suture and the sewing sleeve was secured with silk suture.  Electrocautery was utilized to make a subcutaneous pocket.  Gentamicin  irrigation was utilized to irrigate the pocket and electrocautery was  utilized to assure hemostasis.  The St. Jude Fortify VR single chamber  defibrillator, serial number [id number] was  connected to the defibrillation  lead and placed back in the subcutaneous pocket where the generator was  secured with silk suture.  At this point, the patient was more deeply  sedated with fentanyl and Versed and defibrillation threshold and  testing was carried out.  VF was induced with T-wave shock and a 20  joules shock was delivered, which was terminated the VF and restored  sinus rhythm.  With a 20 volts safety margin, no additional  defibrillation threshold testing was carried out and the incision was  closed with 2-0 and 3-0 Vicryl.  Benzoin and Steri-Strips were painted  on the skin and pressure dressing was applied.  The patient was returned  to his room in satisfactory condition.   COMPLICATIONS:  There were no immediate procedure complications.   RESULTS:  This demonstrates successful insertion of a St. Jude single  chamber defibrillator in a patient with a nonischemic cardiomyopathy,  congestive heart failure, EF 25%.      Doylene Canning. Ladona Ridgel, MD  Electronically Signed     GWT/MEDQ  D:  01/04/2009  T:  01/04/2009  Job:  [id number]

## 2010-06-11 NOTE — Assessment & Plan Note (Signed)
[facility]                            CARDIOLOGY OFFICE NOTE   NAME:Adam [name], Adam [name]                           MRN:          [id number]  DATE:05/27/2007                            DOB:          [birth year]    IDENTIFICATION:  The patient is a 45 year old gentleman with a  nonischemic cardiomyopathy.  I last saw him back in March of this year.   INCOMPLETE DICTATION     Pricilla Riffle, MD, [facility]     PVR/MedQ  DD: 05/27/2007  DT: 05/27/2007  Job #: [id number]

## 2010-06-11 NOTE — Assessment & Plan Note (Signed)
[facility]                            CARDIOLOGY OFFICE NOTE   NAME:[name], Adam                           MRN:          [id number]  DATE:01/07/2007                            DOB:          07-May-1965    IDENTIFICATION:  [name] is a 45 year old gentleman with a history of a  non-ischemic cardiomyopathy and hypertension.   The patient says that for about a week now, he has not been feeling  well. He was seen in the emergency room last week with a complaint of  abdominal pain. Nothing acute was found. He said now that he was short  of breath at the time and nothing was done. In the interval, he has been  taking 1.5 of his Lasix and says that he is urinating more. His weight  has gone up significantly. He denies any change in his dietary habits.   CURRENT MEDICATIONS:  1. Ativan 0.5 p.r.n.  2. Lasix 40 daily.  3. Accupril 40.  4. Metformin 1 gram b.i.d.  5. Glipizide 10.  6. Lantus insulin.  7. Aldactone 25.  8. Toprol XL 100 b.i.d.  9. Nexium 20 b.i.d.  10.Zocor.  11.Tricor 145.  12.Veramyst spray daily.   PHYSICAL EXAMINATION:  GENERAL:  The patient is in no acute distress.  VITAL SIGNS:  Blood pressure 158/115, pulse 84, weight 376 which is up  from 351.  LUNGS:  Occasional rale at the left base, but otherwise clear.  CARDIAC:  Distant. Regular rate and rhythm. S1, S2. No S3. Very distant.  ABDOMEN:  Benign. No right upper quadrant tenderness.  NECK:  Increased JVP.  EXTREMITIES:  1+ edema.   IMPRESSION:  1. Cardiomyopathy. I do indeed think that his volume is increased,      thus explaining his blood pressure. Though again, I am concerned      about some confusion with medicines since I am not sure that he can      read. Would get blood work today, BMET, BNP, and I will be in touch      with him on dosing of Lasix.  2. Hypertension. Again, we will need to follow. Appointment in two      weeks has been made.  3. Dyslipidemia. I need  to review why he is no longer on Zocor. We      will discuss on next visit.  4. Health care maintenance. Should be on aspirin 81 mg daily.   Follow up is December 26th, sooner if problems develop. I will be in  touch with him.     Pricilla Riffle, MD, [facility]  Electronically Signed    PVR/MedQ  DD: 01/07/2007  DT: 01/08/2007  Job #: [id number]

## 2010-06-11 NOTE — Assessment & Plan Note (Signed)
[facility]                            CARDIOLOGY OFFICE NOTE   NAME:Penniman, BRENDON                           MRN:          [id number]  DATE:04/01/2007                            DOB:          04-09-1965    IDENTIFICATION:  Adam [name] is a 45 year old gentleman with a nonischemic  cardiomyopathy and hypertension as well as morbid obesity who was last  seen in January.   I saw him last, he was doing better from a volume standpoint.  I wanted  to see him back to watch his blood pressure and possibly add BiDil.   In the interval he says he has been doing okay.  He stopped smoking,  which I congratulated him on.  His current medicines by his report  include Ativan 0.5 p.r.n., Lasix 60 daily, Accupril 40, metformin 1 gram  b.i.d., glipizide 10 b.i.d. Lantus as directed, Aldactone 25, Toprol XL  100 b.i.d., Tricor 145, Nexium 20 b.i.d. and Veramyst spray.   PHYSICAL EXAM:  The patient is in no distress.  Blood pressure 138/89 pulse is 77, weight 355, which is down 6 pounds  from previous.  His lungs are clear.  Cardiac exam, regular rate and rhythm, no definite S3, but distant.  Abdomen is obese.  Extremities no edema.   IMPRESSION:  1. Cardiomyopathy.  Always not sure if the patient is taking all his      medicines.  I question this.  I am not sure if he can read to know.      LV function is in the 25-30% range.  I would like to extend his      regimen to BiDil.  Given a prescription for this.  Like to see him      back in about 6 to 8 weeks.  Continue the other medicines for now.  2. Diabetes on oral agents.  Followed at [facility].  3. Dyslipidemia.  Will need to confirm when his last lipid panel was      checked.  If it has not been, he will need to get this.   Again follow up later this spring.     Pricilla Riffle, MD, [facility]  Electronically Signed    PVR/MedQ  DD: 04/01/2007  DT: 04/02/2007  Job #: [id number]   cc:   [facility]

## 2010-06-11 NOTE — Assessment & Plan Note (Signed)
[facility]                            CARDIOLOGY OFFICE NOTE   NAME:Flynn, DERONTE                           MRN:          [id number]  DATE:02/18/2008                            DOB:          1965-05-09    IDENTIFICATION:  Mr. Trueba is a 45 year old gentleman with a history of  nonischemic cardiomyopathy, hypertension, and diabetes.  He was last  seen in clinic actually on January 19, 2008, by Charlton Haws for volume  overload.  He admits to having eaten out quite a bit.  Lasix was  increased to 80 b.i.d. and Aldactone was increased to 50 daily.  It was  recommend that he stops smoking and cut back on his salt intake.   Since seen, he has noted a improvement in his symptoms.  His breathing  is better.  His appetite is better.  He still is eating out a lot.   CURRENT MEDICINES:  1. Accupril 40.  2. Metformin 1 g b.i.d.  3. Glipizide 10 b.i.d.  4. Lantus insulin.  5. Aldactone 50.  6. Toprol-XL 100 b.i.d.  7. Nexium 40.  8. Lasix 80 b.i.d.  9. Simvastatin 20.   PHYSICAL EXAMINATION:  GENERAL:  On exam, the patient is in no distress.  VITAL SIGNS:  Blood pressure is 128/86, pulse is 78, weight 354.  NECK:  Increased JVP.  LUNGS:  Relatively clear.  CARDIAC:  Regular rate and rhythm, S1 and S2.  No audible S3.  No  significant murmurs.  ABDOMEN:  Obese, benign.  EXTREMITIES:  1+ edema.   IMPRESSION:  1. Cardiomyopathy, still with increased volume on exam, though he is      clinically feeling better.  We will check labs and be in touch with      him regarding dosing.  2. Hypertension, adequate control.  3. Diabetes on insulin and oral agents.  4. Dyslipidemia on simvastatin.  5. Health care maintenance.  Counseled him on foods.  He really needs      to watch the salt if he goes out and eat, also counseled he said he      is going to be cooking more.  Also, counseled him on tobacco.   I will set to see the patient back in 3 weeks.  Labs as  noted.     Pricilla Riffle, MD, [facility]  Electronically Signed    PVR/MedQ  DD: 02/20/2008  DT: 02/21/2008  Job #: [id number]

## 2010-06-11 NOTE — Assessment & Plan Note (Signed)
HEALTHCARE                            CARDIOLOGY OFFICE NOTE   NAME:Adam [name], Adam [name]                           MRN:          [id number]  DATE:05/27/2007                            DOB:          1965/12/04    IDENTIFICATION:  Adam [name] is a 45 year old gentleman with nonischemic  cardiomyopathy, hypertension, dyslipidemia, morbid obesity and diabetes.  I last saw him back in March.   When I saw him last, I added BiDil to his regimen.  He did not tolerate  this, though, because of headaches, and he has stopped on his own, said  he felt like he was going to have a stroke.   Otherwise he says his breathing is okay.  He denies smoking.  He denies  chest pressure.  Appetite is good.  No edema.   CURRENT MEDICATIONS:  1. Ativan 0.5 mg p.r.n.  2. Lasix 60 mg daily.  3. Accupril 40 mg.  4. Metformin 1 g b.i.d.  5. Glipizide 10 mg b.i.d.  6. Lantus as directed.  7. Aldactone 25 mg.  8. Toprol XL 100 mg b.i.d.  9. Tricor 145 mg.  10.Nexium 20 mg b.i.d.  11.Veramyst spray daily.   PHYSICAL EXAM:  The patient is in no distress.  Note, he did not take  his medicines this morning other than his glucose medicine.  Blood pressure 140/90, his pulse is 70 and regular.  Weight is 355,  stable.  LUNGS:  Clear.  CARDIAC:  Regular rate and rhythm, S1, S2, no S3, no murmurs.  ABDOMEN:  Obese, benign.  EXTREMITIES:  No edema.   IMPRESSION:  1. Nonischemic cardiomyopathy, probably secondary to hypertensive      disease.  Volume status looks good.  It is always difficult.  I      asked him actually to bring his medicine bottles in today.  He did      not.  I have encouraged him to do this again.  I have also told him      he should take his medicines right when he gets up to get better      blood pressure control.  I would not change his regimen for now.  I      will review with Dr. Ladona Ridgel or Graciela Husbands again his cardiac issues      regarding a device.  Again, question  compliance.  2. Dyslipidemia.  His lipid panel from March, LDL of 74, HDL of 25.      He is on Tricor 145 mg.  We will review with Shelby Dubin.  I am not      convinced this is adding much.  Need to get old lipid panels.  He      must be on a statin but, again, a we do not have his bottles.  3. Hypertension.  As noted above.   I will set follow-up for end of July, sooner if problems develop.     Pricilla Riffle, MD, [facility]  Electronically Signed    PVR/MedQ  DD: 05/27/2007  DT: 05/27/2007  Job #:  [id number] 

## 2010-06-11 NOTE — Assessment & Plan Note (Signed)
[place] [facility]                            CARDIOLOGY OFFICE NOTE   NAME:Trani, RHYLEN                           MRN:          [id number]  DATE:08/31/2006                            DOB:          1965-09-27    IDENTIFICATION:  Mr. Froelich is a 45 year old gentleman with a history of  nonischemic cardiomyopathy and hypertension.  He was last seen in  Cardiology back in April.   In the interval he has said he has been doing okay.  He was actually  just seen at the [facility].  He denies chest pain, no  shortness of breath, no nausea, no edema.   CURRENT MEDICATIONS:  1. Ativan 0.5 p.r.n.  2. Niaspan 1 gram nightly.  3. Aspirin 325.  4. Lasix 40.  5. Accupril 40.  6. Metformin 1 gram b.i.d.  7. Glipizide 10 b.i.d.  8. Lantus as directed.  9. Spironolactone 25 daily.  10.Toprol XL 100 in the p.m., 150 in the a.m.  11.Prilosec.   PHYSICAL EXAMINATION:  On exam the patient is an obese 45 year old in no  acute distress.  Blood pressure is 137/92, pulse is 86, weight 351 up  from 344.  NECK:  JVP is normal.  LUNGS:  Clear.  CARDIAC:  Regular rate and rhythm, S1-S2.  EXTREMITIES:  No edema.   IMPRESSION:  1. Cardiomyopathy.  Volume status actually looks good today.  I will      continue on his current regimen but increase his Toprol to 150      b.i.d.  Again it is unclear if he is actually taking all the      medicines but he says he is.  2. Hypertension.  Again, as noted above.  3. Dyslipidemia.  Will need to set up for fasting lipid panel at his      convenience.   Otherwise I will set followup for 3 months, sooner if problems develop.  Will set up for echocardiogram as well.     Pricilla Riffle, MD, [facility]  Electronically Signed    PVR/MedQ  DD: 08/31/2006  DT: 08/31/2006  Job #: [id number]   cc:   [facility]

## 2010-06-11 NOTE — Assessment & Plan Note (Signed)
[place] [facility]                            CARDIOLOGY OFFICE NOTE   NAME:Adam [name], Adam [name]                           MRN:          [id number]  DATE:10/28/2007                            DOB:          1965-07-14    IDENTIFICATION:  Mr. Turpen is a 45 year old gentleman with a nonischemic  cardiomyopathy, hypertension, dyslipidemia, morbid obesity, and  diabetes, also question of noncompliance.  I last saw him in April.   He comes in today.  He says over the past week he thinks he has been  gaining more fluid.  He has had more swelling in his ankles, a little  more shortness of breath.   CURRENT MEDICINES:  1. Ativan 0.5.  2. Lasix.  He recently increased to 60 daily from 40 daily.  3. Accupril 40.  4. Metformin 1 g b.i.d.  5. Glipizide 10 b.i.d.  6. Lantus as directed.  7. Aldactone 25.  8. Toprol-XL 100 b.i.d.  9. Tricor 145.  10.Nexium 20 b.i.d.  11.Veramyst.   PHYSICAL EXAMINATION:  GENERAL:  The patient is in no distress at rest.  VITAL SIGNS:  Blood pressure 122/78 (the lowest I have seen it), pulse  is 56 and slightly irregular, weight 363 up from 355.  NECK:  Difficult to assess JVP.  LUNGS:  Relatively clear.  CARDIAC:  Regular rate and rhythm, S1-S2, no definite S3, no significant  murmurs.  ABDOMEN:  Benign.  EXTREMITIES:  Edema 1+.   A 12-lead EKG shows sinus rhythm with frequent atrial ectopy, rate of 79  beats per minute.  This is new for him as well.   IMPRESSION:  1. Cardiomyopathy.  His volume status appears to be a little up.  I      would like to check some electrolytes today as well as a BMP before      telling him what to do with his Lasix dose.  His EKG shows quite a      bit of ectopy whether this is contributing to his decreased forward      output I do not know.  I would like to set him up for a 24-hour      Holter to see what he does.  2. Hypertension in good control now remarkably.  I am not sure what to      make of  this, but will follow.  3. Dyslipidemia.  Keep on medicines for now.  He is on Tricor.  4. Diabetes on oral agents.  He had been on a statin, it was      discontinued, I will need to review.     Pricilla Riffle, MD, [facility]  Electronically Signed    PVR/MedQ  DD: 10/28/2007  DT: 10/29/2007  Job #: [id number]

## 2010-06-11 NOTE — Assessment & Plan Note (Signed)
[facility]                            CARDIOLOGY OFFICE NOTE   NAME:Adam [name], Adam [name]                           MRN:          [id number]  DATE:04/06/2008                            DOB:          12-Sep-1965    IDENTIFICATION:  Adam [name] is a 45 year old gentleman.  He has a  nonischemic cardiomyopathy, dyslipidemia, and hypertension.  I last saw  him back in December.  He did not show up for the last clinic visit  because of the snow.   He still complaining of volume overload with lower extremity swelling.  Says he does not think the water pill is working like it should,  appetite is okay, though no PND.   CURRENT MEDICATIONS:  I think;  1. Accupril 40.  2. Metformin 1 g b.i.d.  3. Glipizide 10 b.i.d.  4. Lantus as directed.  5. Aldactone ? 50 daily.  6. Toprol-XL 100 b.i.d.  7. Nexium 40.  8. Lasix 80 b.i.d.  9. Simvastatin 20.   PHYSICAL EXAMINATION:  On exam, the patient is in no distress at rest.  Blood pressure was 140/89, pulse is 83 and regular, and weight 377 which  is up from 354 back in January.  NECK:  Increased JVP.  LUNGS:  Clear with no rales or wheezes.  CARDIAC:  Regular rate and rhythm.  S1 and S2.  No S3.  ABDOMEN:  Obese.  EXTREMITIES:  1+ edema.   A 12-lead EKG, normal sinus rhythm, 83 bpm.  Right axis deviation, low  voltage. (unchanged from previous).   IMPRESSION:  1. Cardiomyopathy.  Volume status does appear to be increased.  I will      check a BMET and BMP today and then they just changed his Lasix      dosing on that, continue other medicines for now.  I would like to      see him back in about 3-4 weeks.  2. Hypertension, fair.  Again, I think it should get better with      volume removal.  3. Dyslipidemia, on simvastatin.  I think again, I am not sure he      knows all the medicines he is on.  4. Diabetes, on oral agents plus Lantus.   Followup as noted.     Pricilla Riffle, MD, [facility]  Electronically  Signed    PVR/MedQ  DD: 04/06/2008  DT: 04/07/2008  Job #: [id number]

## 2010-06-14 ENCOUNTER — Telehealth: Payer: Self-pay | Admitting: *Deleted

## 2010-06-14 NOTE — Assessment & Plan Note (Signed)
[place] [facility]                            CARDIOLOGY OFFICE NOTE   NAME:Adam [name], Adam [name]                           MRN:          [id number]  DATE:01/30/2006                            DOB:          07-20-1965    IDENTIFICATION:  Adam [name] is a 45 year old gentleman.  History of non  ischemic cardiomyopathy and hypertension, as well as diabetes.  I saw  him back in September.   In the interval, he has been seen by Dr. Craige Cotta for a CPAP.   Currently he says his breathing is okay, denies chest pressure, no PND,  no orthopnea, no lower extremity edema.  It is not clear if he ran out  of his medicines, he says he is just about to run out.   CURRENT MEDICATIONS:  1. Protonix 3 daily.  2. Lasix 40 mg  daily.  3. Niacin 1 gram daily.  4. Quinapril 40 mg daily.  5. KCL 20 mg daily.  6. Glucotrol 10 mg b.i.d.  7. Aldactone 50 mg daily.  8. Glucophage 1 gram b.i.d.?  9. Zocor 20 mg.  10.Lorazepam 0.5 mg.  11.Toprol XL 125 mg b.i.d.  12.Insulin.   PHYSICAL EXAMINATION:  Patient is in no distress.  Blood pressure is  146/93, pulse is 77, weight 351 which is up from 342 last visit.  NECK:  JVP is normal.  LUNGS:  Clear.  CARDIAC EXAM:  Regular rate and rhythm, S1, S2 no S3.  No murmurs.  ABDOMEN:  Benign, obese.  EXTREMITIES:  No edema.   IMPRESSION:  1. Cardiomyopathy, although it is very difficult for me to know if he      is taking his medicines.  Nurse insists that he had run out, now he      is telling me he actually has not run out yet.  His heart rate is      up suggesting he may not be getting his beta blocker as needed.   For now I will refill his medicines with his erratic-ness I am  uncomfortable giving him Aldactone and I would pull this out of his  regiment.   1. Dyslipidemia, continue on Zocor 20 mg.   1. Diabetes, now on insulin as started by [facility].   I will check his BMET, BMP and hemoglobin A1c today.  Follow up with  the  patient in about 3 months.     Pricilla Riffle, MD, [facility]  Electronically Signed    PVR/MedQ  DD: 01/30/2006  DT: 01/30/2006  Job #: [id number]   cc:   [facility]

## 2010-06-14 NOTE — Assessment & Plan Note (Signed)
[place] [facility]                            CARDIOLOGY OFFICE NOTE   NAME:Adam [name], Adam [name]                           MRN:          [id number]  DATE:05/14/2006                            DOB:          04/24/1965    IDENTIFICATION:  Adam [name] is a 45 year old gentleman with nonischemic  cardiomyopathy and hypertension. I last saw him in clinic back in  January on the 4th.   In the interval he says he is doing well. He is followed in the [facility]. His breathing has been good. Denies chest  pain, no PND, appetite good, ate a sausage biscuit for breakfast.   CURRENT MEDICATIONS:  Include;  1. Ativan p.r.n.  2. Niaspan 1 gram at bedtime.  3. Aspirin 325.  4. Toprol XL ? 150 b.i.d.  5. Protonix 40 daily.  6. Lasix 40 daily.  7. Accupril 40 daily.  8. Metformin 1 gram b.i.d.  9. Glipizide 10 b.i.d.  10.Lantus insulin.  11.Spironolactone 25 daily.   PHYSICAL EXAMINATION:  On exam the patient is in no distress. Blood  pressure 143/91, pulse is 76.  NECK: JVP is normal.  LUNGS: Clear.  CARDIAC EXAM: Regular rate and rhythm. S1, S2, no murmur. No S3.  ABDOMEN: Benign, obese.  EXTREMITIES: No edema.   IMPRESSION:  1. Nonischemic cardiomyopathy. Patient's blood pressure is higher than      I like but I really have a hard time adjusting his medicine, I am      not convinced he knows what he is on, or if he can read. He assures      me he is taking everything. I have gone up on things, I would leave      him where he is for now. I told him to bring his bottles in the      next visit.   We will set a follow up echocardiogram. To reevaluate ejection fraction.  Again overall compliance is an issue in him regarding any further  intervention.   1. Hypertension, as noted above, a little high. We will follow for      right now, it has been difficult in this patient.   1. Diabetes, on oral agents and Lantus. Followed in medicine.   I  will follow up the patient in clinic in August, an echocardiogram at  his convenience.     Pricilla Riffle, MD, [facility]  Electronically Signed    PVR/MedQ  DD: 05/14/2006  DT: 05/14/2006  Job #: [id number]   cc:   [facility]

## 2010-06-14 NOTE — Assessment & Plan Note (Signed)
[facility]                                 ON-CALL NOTE   NAME:[name], Adam                           MRN:          [id number]  DATE:06/03/2006                            DOB:          29-Nov-1965    TIME:  1817.   PRIMARY CARDIOLOGIST:  Pricilla Riffle, MD, [facility].   I received a page to the answering service from Dwain Huhn at [phone], date of birth January 01, 1966, regarding prescription medication.  I  called [name] back at the above phone.  He states that he is on  Protonix.  He thinks Dr. Tenny Craw is the physician that prescribed it.  He  has been out of it for eight days.  He had requested a refill on the  medication.  He states that he has called our office three times and  left messages, and no one has called him back.  He has gone to his  pharmacy.  They say they have called the office and requested refills.  Apparently, they told the patient that there was a form that had to be  filled out before they could give him his medications.  He states that  they gave him three pills, but he is still waiting for the medication.  He is having a lot of heartburn.  I told him that the office was closed  now.  I apologized for the inconvenience.  I instructed him to try some  Mylanta tonight and to get some over-the-counter Pepcid temporarily, and  I would try to get in touch with Dr. Tenny Craw' nurse, Annice Pih, and find out  what was going on.  I then called the office and left a message to call  patient at home and let him know what is going on.  I actually spoke  with Annice Pih, who happened to still be in the office.  She states that  the patient needs a prior approval/authorization form to be filled out.  When she started obtaining information, it turns out [facility]  supposedly, Dr. Russella Dar, was the prescribing physician, not Dr. Tenny Craw, and  that the prescribing physician would need to fill out the form for  approval.  Annice Pih stated that she had passed the  form on to Dr. Ardell Isaacs  nurse, and they currently had the patient's chart at the GI office also.  I then called [name] back at home, relayed the above information to  him, told him that they were working on it, it was a matter of  paperwork, and once again, I reiterated for him to try the Mylanta  tonight and see if the Pepcid over-the-counter helped.  I asked him to  follow up with Dr. Ardell Isaacs nurse tomorrow for further information  regarding the form.  He states that he would do this and appreciated the  information.      Dorian Pod, ACNP  Electronically Signed      Gerrit Friends. Dietrich Pates, MD, [facility]  Electronically Signed   MB/MedQ  DD: 06/03/2006  DT: 06/04/2006  Job #:  [id number] 

## 2010-06-14 NOTE — Telephone Encounter (Signed)
Could not reach patient at home so I called his neighbor Clydie Braun and asked that she give him a message to make an appointment with Dr.Ross. She will let him know that we need to see him in follow up in the cardiology clinic.

## 2010-06-14 NOTE — Cardiovascular Report (Signed)
NAMEROBIE, MCNIEL                  ACCOUNT NO.:  [id number]   MEDICAL RECORD NO.:  [id number]          PATIENT TYPE:  INP   LOCATION:  4705                         FACILITY:  [facility]   PHYSICIAN:  Arvilla Meres, M.D. LHCDATE OF BIRTH:  1965/03/26   DATE OF PROCEDURE:  03/28/2004  DATE OF DISCHARGE:                              CARDIAC CATHETERIZATION   PRIMARY CARE PHYSICIAN:  Dr. Denny Peon.   CARDIOLOGIST:  He is new to [facility] and now is followed by Dr.  Dietrich Pates.   PATIENT IDENTIFICATION:  Adam [name] is a very pleasant, 45 year old, morbidly  obese male with poorly controlled hypertension, who was admitted with heart  failure and found to have an ejection fraction of 25% with regional wall  motion abnormalities.  He is referred for a diagnostic right and left heart  catheterization.   PROCEDURES PERFORMED:  1.  Selective coronary angiography.  2.  Left catheterization, but no ventriculogram secondary to markedly      elevated EDP.  3.  Right heart catheterization with Fick cardiac outputs.   DESCRIPTION OF PROCEDURE:  The risks and benefits of the catheterization  were explained to Adam [name].  Consent was signed and placed on the chart.  The right groin area was anesthetized with 1% local lidocaine.  A 7 French  venous sheath was placed in the right femoral vein using a modified  Seldinger technique.  A 6 French arterial sheath was placed in the right  femoral artery using the same technique.  Standard catheters were used for  the procedure, including standard a 7 [place] Swan-Ganz catheter, as well as  6 [place] preformed Judkins JL4, JR4 and bent pigtail.  All catheter  exchanges were made over a wire.  There were no apparent complications.  After the procedure, the patient was returned to the holding area for  removal of his vascular access.   FINDINGS:  Hemodynamics:  The central aortic pressure was 140/110 with a  mean of 123.  LV pressure was 145/24 with an LVEDP  of 41.  There was no  gradient on aortic valve pullback.  Wedge pressure versus LV pressure showed  no evidence of a mitral stenosis.   The right atrial pressure had a mean of 28.  RV was 71/24.  PA pressure was  76/47 with a mean of 60.  Pulmonary capillary wedge pressure was mean of 45.  Mixed venous saturation was 64%.  Femoral artery saturation was 91%.  Fick  cardiac output was 6.4 L/min.  Fick cardiac index was 2.3 L/min per sq m.   CORONARY ANATOMY:  Left main:  Normal.   Left anterior descending:  A large vessel which gives off a large diagonal.  There is no angiographic CAD.   Left circumflex:  The left circumflex was a large vessel that gives off a  large ramus and a tiny OM1 and OM2.  There is no angiographic CAD.   Right coronary artery:  The right coronary artery was a dominant vessel.  It  gave off a normal PDA off of the acute marginal branch.  There  were three  small PLs.  There was no angiographic CAD.   A left ventriculogram was not done secondary to a markedly elevated LVEDP.   ASSESSMENT AND PLAN:  Adam [name] has severe nonischemic cardiomyopathy with  markedly elevated filling pressures which is likely due to his hypertensive  heart disease.  In the catheterization laboratory, he was given 80 mg of IV  Lasix and 20 mg of IV hydralazine.  He will be placed on a nitroglycerin and  Lasix drip for aggressive diuresis.  He will also need aggressive control of  his blood pressure.  We have started him on low-dose Coreg and he will  likely also need ACE inhibitor once it is clear that his creatinine is  stable after catheterization.  Given his body habitus, hypertension and  markedly elevated pulmonary pressures, would also consider workup for  obstructive sleep apnea.      DB/MEDQ  D:  03/28/2004  T:  03/28/2004  Job:  [id number]   cc:   Dr. Pershing Proud, M.D.

## 2010-06-14 NOTE — Procedures (Signed)
Adam [name], Adam [name]                  ACCOUNT NO.:  [id number]   MEDICAL RECORD NO.:  [id number]          PATIENT TYPE:  OUT   LOCATION:  SLEEP CENTER                 FACILITY:  [facility]   PHYSICIAN:  Clinton D. Maple Hudson, M.D. DATE OF BIRTH:  07-25-1965   DATE OF STUDY:  05/14/2004                              NOCTURNAL POLYSOMNOGRAM   STUDY DATE:  May 14, 2004   REFERRING PHYSICIAN:  Dr. Chrissie Noa Averett   INDICATION FOR STUDY:  Insomnia with obstructive sleep apnea.  Epworth  Sleepiness Score 0/24, BMI 39, weight 300 pounds.  A diagnostic NPSG on  April 05, 2004 recorded RDI of 47 per hour.  CPAP titration is requested.   SLEEP ARCHITECTURE:  Short total sleep time 267 minutes with sleep  efficiency 68%.  Stage I was 19%, stage II 62%, stages III and IV 11%, REM  was 9% of total sleep time.  Sleep latency 75 minutes, REM latency 244  minutes, awake after sleep onset 53 minutes, arousal index 36.   RESPIRATORY DATA:  CPAP titration protocol.  CPAP was titrated to 11 CWP,  RDI 2.4 per hour which eliminated snoring.  A large Respironics ComfortGel  Mask was used.   OXYGEN DATA:  Snoring was prevented at CPAP of 11.  Oxygen saturation on  CPAP was 94%.   CARDIAC DATA:  Sinus rhythm with frequent PVCs.   MOVEMENT/PARASOMNIA:  A total of 123 limb jerks were recorded of which 28  were associated with arousal or awakening for a periodic limb movement with  arousal index of 6.3 per hour which is increased.   IMPRESSION/RECOMMENDATION:  1.  Successful continuous positive airway pressure titration to 11 CWP,      respiratory disturbance index 2.4 per hour, using a large Respironics      ComfortGel Mask with heated humidifier.  2.  Original diagnostic NPSG on April 05, 2004 recorded a respiratory      disturbance index of 47 per hour.  3.  Cardiac rhythm significant for premature ventricular contractions.  4.  He slept on extra pillows due to acid reflux.  5.  Periodic limb movement with  arousal, 6.3 per hour.  6.  Sleep was restless.  Patient may benefit from a sedative/hypnotic      medication for sleep during his initial adjustment to home continuous      positive airway pressure if clinically appropriate.      CDY/MEDQ  D:  05/19/2004 11:39:33  T:  05/19/2004 12:38:54  Job:  [id number]

## 2010-06-14 NOTE — Procedures (Signed)
Adam [name], Adam [name]                  ACCOUNT NO.:  [id number]   MEDICAL RECORD NO.:  [id number]          PATIENT TYPE:  OUT   LOCATION:  SLEEP CENTER                 FACILITY:  [facility]   PHYSICIAN:  Clinton D. Maple Hudson, M.D. DATE OF BIRTH:  Nov 28, 1965   DATE OF STUDY:  04/05/2004                              NOCTURNAL POLYSOMNOGRAM   REFERRING PHYSICIAN:  Dr. Lester Kinsman.   INDICATION FOR STUDY:  Hypersomnia with sleep apnea. Epworth sleepiness  score 2/24. BMI 43, weight 325 pounds.   SLEEP ARCHITECTURE:  Total sleep time 331 minutes with sleep efficiency 76%.  Stage 1 was 21%, stage 2 67%, stages 3 and 4 2%, REM was 10% of total sleep  time. Sleep latency 30 minutes, REM latency 99 minutes, awake after sleep  onset 72 minutes, arousal index 43 - which is increased. No medicines were  taken.   RESPIRATORY DATA:  Respiratory disturbance index (RDI) 47.1 obstructive  events per hour indicating severe obstructive sleep apnea/hypopnea syndrome.  This included four central apneas, 89 obstructive apneas, and 167 hypopneas.  Events were not positional. REM RDI 40.6. The technician was unable to  titrate CPAP by protocol. Sleep was fragmented and the patient did not  accumulate enough events and sleep time within the time limits allowed for  CPAP titration by this protocol on this study night. He also could not  tolerate more than 4 cm of water pressure by CPAP, saying the pressure was  too high. He was unable to sleep supine because he was not in his own bed.   OXYGEN DATA:  Snoring was noted with oxygen desaturation to a nadir of 71%.  Mean oxygen saturation through the study was 92% on room air.   CARDIAC DATA:  Normal sinus rhythm with rare PVC.   MOVEMENT/PARASOMNIA:  Occasional leg jerk.   IMPRESSION/RECOMMENDATION:  1.  Severe obstructive sleep apnea/hypopnea syndrome, respiratory      disturbance index 47.1 per hour with snoring and oxygen desaturation to      71%.  2.   Technician was unable to titrate CPAP by protocol on this study night      for reasons stated. Consider return for CPAP titration and suggest      bringing a sleeping pill if he does so. Otherwise, consider for      alternative therapies.      CDY/MEDQ  D:  04/14/2004 13:06:29  T:  04/15/2004 10:55:54  Job:  [id number]

## 2010-06-14 NOTE — Discharge Summary (Signed)
NAMEBRIGG, Adam [name]                  ACCOUNT NO.:  [id number]   MEDICAL RECORD NO.:  [id number]          PATIENT TYPE:  OUT   LOCATION:  SLEE                         FACILITY:  [facility]   PHYSICIAN:  Duncan Dull, M.D.     DATE OF BIRTH:  14-Nov-1965   DATE OF ADMISSION:  04/05/2004  DATE OF DISCHARGE:  04/05/2004                                 DISCHARGE SUMMARY   DISCHARGE DIAGNOSES:  1.  Nonischemic cardiomyopathy.  2.  Hypertension.  3.  Gastroesophageal reflux disease.  4.  Diabetes mellitus.   DISCHARGE MEDICATIONS:  1.  Lasix 60 mg p.o. daily.  2.  K-Dur 20 mg p.o. daily.  3.  Benazepril 40 mg p.o. daily.  4.  Coreg 6.25 mg p.o. b.i.d.  5.  Aspirin 325 mg p.o. daily.  6.  Metformin 1000 mg p.o. daily.  7.  Glipizide 10 mg p.o. daily.  8.  Prilosec 20 mg p.o. daily.  9.  Niaspan 5 mg p.o. q.h.s.   FOLLOW-UP PLAN:  The patient will follow up with Dr. Tenny Craw at [facility] on April 18, 2004 at 9 a.m. as well as with Dr. Graciela Husbands in June of  2006.  Additionally, a sleep study was set up for Sunday, April 07, 2004 at  8:30 p.m. at [facility].   DISCHARGE DIET:  He was advised to prescribe to a low salt, low fat, low  cholesterol, American Diabetes Association diet.   DISCHARGE INSTRUCTIONS:  He was also told to observe his cardiac  catheterization site for bruising, drainage, swelling and/or discomfort.   PROCEDURES:  Right and left heart catheterization by Dr. Gala Romney as well  as a coronary arteriogram by Dr. Gala Romney on March 28, 2004.  Findings noted  on echart.   HISTORY OF PRESENT ILLNESS:  Adam [name] is a 45 year old gentleman who  presents to the emergency department with paroxysmal supraventricular  tachycardia and progressive dyspnea.  He has no known prior cardiac history  and was recently evaluated by Dr. Keenan Bachelor, his primary care physician with  some vague midepigastric complaints.  It was thought to be GI in origin and  he was treated with a  PPI as well as followed with a Cardiolite study.  The  myocardial images suggested anterior apical and inferior apical ischemia.   PHYSICAL EXAMINATION:  VITAL SIGNS: Temperature of 100.4, heart rate 105,  respirations 22, blood pressure 155/95, O2 sat of 95% on room air.  HEENT: Pupils equal round and reactive to light.  Extraocular movements are  intact. No scleral icterus noted.  Neck has mild JVD, normal carotid  upstrokes without bruits.  LYMPH NODES: No lymphadenopathy.  CARDIOVASCULAR: Normal S1, S2, possible S4.  ABDOMEN: Soft, nontender, nondistended with positive bowel sounds.  EXTREMITIES: 1-2+ edema in ankles but distal pulses were intact.  MUSCULOSKELETAL: No joint deformities or effusions.   HOSPITAL COURSE:  1.  Nonischemic cardiomyopathy.  Patient was initially seen by cardiology      even though the patient was a cardiac patient of Dr. Keenan Bachelor and was  transitioned to the [facility] Teaching Service.  He      was followed by cardiology initially who performed a left heart      catheterization, continued on his IV diuresis with Lasix, given oxygen,      started on Coreg and other medical management optimized.  For full      catheterization report please see the chart.  Patient was aggressively      diuresed.  His medical management optimized to include Altace, Coreg,      aspirin, hydralazine and also felt that his obstructive sleep apnea      hence sleep study was set up as an outpatient.  Findings at cardiac      catheterization included nonischemic cardiomyopathy with markedly      elevated filling pressures likely secondary to hypertensive heart      disease.  As noted above, obstructive sleep apnea was thought to      contribute and sleep study was set up as an outpatient.   1.  Hypertension.  He was well controlled on his medications as noted above      at time of discharge and he will be followed up with possible      obstructive sleep  apnea work up with sleep study as an outpatient.   1.  Gastroesophageal reflux disease.  He was continued on his Protonix and      did well.   1.  Diabetes mellitus.  His Glucophage was held.  He was continued on his      Glucotrol along with sliding scale insulin and then transitioned back to      his oral p.o. medications at the time of discharge and did well on this      regimen.   DISCHARGE LABORATORY DATA:  Sodium of 137, potassium 4.5, chloride 106, CO2  26, BUN 14, creatinine 1.1, glucose 118.  BNP of 53.       AK/MEDQ  D:  07/04/2004  T:  07/04/2004  Job:  [id number]

## 2010-06-14 NOTE — Assessment & Plan Note (Signed)
[facility]                              CARDIOLOGY OFFICE NOTE   NAME:Steinke, TAYVEN                           MRN:          [id number]  DATE:10/16/2005                            DOB:          1965/10/13    IDENTIFICATION:  Mr. Winterhalter is a 45 year old gentleman with a history of a  nonischemic cardiomyopathy and hypertension as well as diabetes.  I last saw  him back in June.   Since seen he says he has had increased sinus drainage recently.  He is on  Robitussin DM as well as nasal steroids.  He denies fevers.  His sputum is  usually clearish with some yellow/occasional brown.   His breathing is about the same.  He says his appetite has been good.  He  denies chest pain.  No PND.   CURRENT MEDICATIONS:  He did not bring his medicines but agreed with the  list:  1. Protonix 40 daily.  2. Lasix 40 daily.  3. Niacin 1 gram daily.  4. Benazepril 40 daily.  5. Potassium 20 mEq daily.  6. Glucotrol 10 b.i.d.  7. Insulin question dose q.h.s.  8. Ativan 0.5 b.i.d.  9. Aldactone 50 daily.  10.Glucophage daily/b.i.d.  11.Xanax 0.5 daily.  12.Zocor question 20 daily.  13.Toprol XL at 125 b.i.d.   PHYSICAL EXAMINATION:  GENERAL:  On exam the patient is in no distress.  VITAL SIGNS:  Blood pressure is 152/102, pulse is 83, weight 357 down from  362 in June.  RESPIRATORY:  Lungs are relatively clear with cough.  CARDIAC EXAM:  Regular rate and rhythm, S1/S2, no definite S3.  EXTREMITIES:  No edema.   Twelve-lead EKG normal sinus rhythm 83 beats per minute.  Poor R-wave  progression.   IMPRESSION:  1. Cardiomyopathy.  Volume status looks okay today.  His medicine regimen      is good but I am not sure he is taking it.  I asked him to bring his      medicines in the next time so I really see what his pill bottles are.      This has been a continued problem.  2. Hypertension.  Again as noted above, I wonder if he is taking his      medicines.  He  says he is but his blood pressure remains high.  3. Dyslipidemia.  We will need to set up for a fasting lipid panel at some      point.  Last one was in January.  We will discuss on return.  4. Diabetes.  On insulin now as well as oral agents.  Again, he is      followed at [facility] for this.   I will set followup for 1 month again with his medicine bottles.  Sooner if  problems develop.                                Pricilla Riffle, MD, [facility]    PVR/MedQ  DD:  10/16/2005  DT:  10/18/2005  Job #:  [id number]   cc:   [facility]

## 2010-06-14 NOTE — H&P (Signed)
Adam [name], Adam [name]                  ACCOUNT NO.:  [id number]   MEDICAL RECORD NO.:  [id number]          PATIENT TYPE:  INP   LOCATION:  1824                         FACILITY:  [facility]   PHYSICIAN:  Parkman Bing, M.D.  DATE OF BIRTH:  1965/04/23   DATE OF ADMISSION:  03/26/2004  DATE OF DISCHARGE:                                HISTORY & PHYSICAL   PRIMARY CARE PHYSICIAN:  Dr. Melvenia Needles.   HISTORY OF PRESENT ILLNESS:  A 45 year old gentleman presenting to the  emergency department with paroxysmal supraventricular tachycardia and  progressive dyspnea.   Adam [name] has no known prior cardiac history. He was recently evaluated by  his primary care physician with an odd empty sensation in the epigastrium.  This was thought to be  GI in origin and treated with a PPI; however, a  stress Myoview study was also ordered.  On that examination, the patient exercised to a work level not specified on  the report by the radiologist. Peak heart rate was 134. Exercise was limited  by extreme dyspnea, diaphoresis and abdominal pain. EKG response is not  specified. By imaging, there was marked cardiac dilatation as well as severe  global left ventricular dysfunction. Estimated ejection fraction was 0.25.  The myocardial images suggested anterior apical and inferior apical  ischemia.   Adam [name] has had hypertension for a number of years that has been well  controlled. He has had diabetes for the past 2 years or so. He continues to  smoke cigarettes, albeit at the reduced rate of one half pack per day. He is  unaware of his lipid status, but takes no lipid lowering medication.  He is  sedentary since developing substantial dyspnea recently.   Past medical history is otherwise benign. The patient has never been  admitted to a hospital. He has never undergone surgery.   ALLERGIES:  He reports no medical allergies.   MEDICATIONS:  His medical regime has included metformin 1 gram daily,  furosemide  40 milligrams daily, glipizide 10 milligrams daily, Prevacid  daily and Mylanta p.r.n.   SOCIAL HISTORY:  Lives in [place] with a male friend. Works  Holiday representative. He denies excessive use of alcohol or any use of illicit  drugs. Does not believe his HIV status is likely positive.   FAMILY HISTORY:  Mother died at age 25 due to diabetes. Father suffered a  fatal myocardial infarction at age 19. He has three siblings, none with  coronary disease.   REVIEW OF SYSTEMS:  Notable for recent weight gain, a nagging cough  productive of a moderate amount of mucoid sputum. He has rare nocturia. He  has some postprandial abdominal pain. All other systems reviewed and are  negative.   PHYSICAL EXAMINATION:  VITAL SIGNS: On exam, the initial temperature was  100.4. Heart rate 105 and regular, respirations 22, blood pressure 155/95,  O2 saturation 95% on room air.  HEENT: Pupils equal, round, react to light; normal lids and conjunctiva; no  scleral icterus.  NECK:  Mild jugular venous distension. Normal carotid upstrokes without  bruits.  ENDOCRINE:  No thyromegaly.  SKIN:  No significant lesions.  LYMPH NODES: No cervical, axillary or inguinal adenopathy.  LUNGS:  Clear.  CARDIAC:  Normal first and second heart sounds; fourth heart sound present.  ABDOMEN:  Soft and nontender; no organomegaly; normal bowel sounds without  bruits.  EXTREMITIES: 1/2+ ankle edema; distal pulses intact.  NEUROMUSCULAR: Symmetric strength and tone.  MUSCULOSKELETAL: No joint deformities.   SVT noted on the monitor to a rate of 190. Initial conversion was with 6  milligrams of IV adenosine. The arrhythmia subsequently recurred and was  converted with 25 milligrams of IV diltiazem. After reversion to sinus  rhythm, EKG showed sinus tachycardia, left atrial abnormality, PACs,  borderline low voltage, and poor R-wave progression consistent with anterior  myocardial infarction.   Chest x-ray is pending. CBC  and B-MET are normal.   IMPRESSION:  Mr .[name] presents with paroxysmal supraventricular tachycardia  that is fairly well tolerated. He does develop a gnawing sensation in the  epigastrium that is his signal indicating that SVT is occurring. He  responded promptly to intravenous diltiazem.  Oral diltiazem  will be added  to his medical regimen in an attempt to control this arrhythmia  pharmacologically.   Of more concern is his exertional dyspnea accompanied by a markedly abnormal  stress Myoview study. The study was of poor quality and may not be perfectly  accurate. An echocardiogram will be performed. If decreased left ventricular  systolic function is verified, he will need cardiac catheterization.   We will assess control of hyperlipidemia. A lipid profile will be performed  - he likely needs treatment with a pharmacologic agent in light of his  diabetes. He is strongly encouraged to give up cigarette smoking -  consultation will be obtained towards this end.  He he has conquered tobacco, he can work on his weight.   Low dose Lovenox will be given to prophylaxes against DVT. D-dimer and BNP  level is pending.Marland Kitchen His dose of furosemide will be increased now with the  assumption that he has at least mild congestive heart failure.      RR/MEDQ  D:  03/27/2004  T:  03/27/2004  Job:  [id number]

## 2010-06-14 NOTE — Consult Note (Signed)
NAMERAVON, MORTELLARO                  ACCOUNT NO.:  [id number]   MEDICAL RECORD NO.:  [id number]          PATIENT TYPE:  INP   LOCATION:                               FACILITY:  [facility]   PHYSICIAN:  Duke Salvia, M.D.  DATE OF BIRTH:  09/23/1965   DATE OF CONSULTATION:  03/27/2004  DATE OF DISCHARGE:                                   CONSULTATION   s   CONSULTING PHYSICIAN:  Duke Salvia, M.D.   PRIMARY CAREGIVER:  Dr. Melvenia Needles of the [facility].   CARDIOLOGIST:  Carole Binning, M.D. [facility] will do catheterization on March 28, 2004.   ALLERGIES:  No known drug allergies.   PRESENTING CIRCUMSTANCE:  I got so I couldn't breath, so I came into the  hospital.   HISTORY OF PRESENT ILLNESS:  [name] is 45 year old male who has had  a two-week history of essentially dyspnea on exertion and fatigue.  Prior to  that, he seemed to able to work a full schedule.  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  In the last two weeks in addition  to this trouble breathing, he has felt as he pushes himself a tightness  welling up from  his mid section, going up into the chest.  He does not  describe it as a chest pain per se but it is just a feeling that he has with  exertion.  He has also noticed increased swelling in the lower extremities  and some daytime somnolence.  The patient had been complaining of chest pain  as early as mid February.  He underwent an exercise Cardiolite study at  [facility] on March 21, 2004.  He was unable to complete to  predicted maximum heart rate of 181, only achieving maximum heart rate of  134.  He had to stop exercising secondary to extreme dyspnea, diaphoresis,  and abdominal pain.  The stress images show inducible ischemia along the  anterior apical and inferior apical regions.  The left ventricle is dilated  consistent with  cardiomyopathy.  There is diffuse hypokinesis of the left  ventricular chamber along the anterior septal and inferior regions,  dyskinesis at the cardiac apex.  There is preserved wall motion of the  lateral wall.  Ejection fraction estimated at 25%.  So, the patient  presented in the afternoon, of March 26, 2004, with dyspnea and a  sensation of epigastric discomfort and lower extremity swelling.  While in  the emergency room, he was noted to have episodes of supraventricular  tachycardia which apparently were terminated with adenosine.  The patient's  resting heart rate had achieved 188 beats per minute with this narrow  complex tachycardia, after adenosine 188 beats per minute reduced to 98  beats per minute.  During these tachy arrhythmias, the patient did feel some  symptoms in the chest which he called like a nervous twitching but no  outright feeling of palpitation, and  he did feel flushed and hot.  The  patient was started on Cardizem.  The patient required 25 mg IV for a repeat  SVT heart rate of about 196 which reduced after Cardizem to 104.  There were  apparently no rhythm strips __________ to record exactly what this rhythm  looked like.  The patient has, so far, ruled out for myocardial infarction  based on troponin I studies both in the ED and later on, on transfer to the  floor.  The patient also has a history of diabetes on oral agents, diagnosed  about three years ago, hypertension, finding of dyslipidemia on fasting  lipid profile with an HDL of 25.  He was obese, has GERD, very possibly  obstructive sleep apnea since he reports a daytime somnolence, however, no  morning headache.  It is hard to get any information as to whether the  patient snores at night.  Electrophysiology is asked to consult from several  viewpoints.  The patient most probably has a history, in the emergency room,  of a supraventricular tachycardia which if not amenable to medical treatment  may  be amenable to ablation.  He also has severely reduced ejection fraction  of 25% on Cardiolite study with inducible ischemia in the anterior apical  and inferior apical regions.  He will have a left heart catheterization on  the morning of March 28, 2004.   ALLERGIES:  No known drug allergies.   MEDICATIONS:  1.  Protonix 40 mg twice daily.  2.  Metformin 1,000 mg daily.  3.  Lasix 40 mg daily.  4.  Benazepril 40 mg daily.  5.  Glipizide 10 mg daily.  6.  Omeprazole 20 mg daily.   PAST MEDICAL HISTORY:  1.  Diabetes x 3 years.  2.  Hypertension.  3.  Dyslipidemia.  4.  GERD.  5.  Decreased ejection fraction by Cardiolite, March 21, 2004.   SOCIAL HISTORY:  The patient lives in [place] with his girlfriend.  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  He, up until very recently last 1-2 days, smoked cigarettes one  half pack per day.  No alcoholic beverages.  No recreational drugs.   FAMILY HISTORY:  Mother died at age 16.  She had diabetes and end stage  renal failure.  Father died at age 81, unknown causes.  Two brothers and one  sister both living without diabetes, without known coronary artery disease.   REVIEW OF SYSTEMS:  The patient is not having fever, chills, night sweats,  or adenopathy.  HEENT:  No nasal discharge, no epistaxis.  He has some  hoarseness with sinus discharge lately.  No vertigo, no photophobia.  INTEGUMENT:  No rashes or non-healing ulcerations.  CARDIOPULMONARY:  The  patient is having shortness of breath, particularly dyspnea with exertion.  The patient does at times wake up at night short of breath, having to sit up  for 5-10 minutes to catch his breath.  He definitely notes increasing lower  extremity edema which has already abated somewhat over the 16 hours of this  hospitalization on diuretic but he still notes that he has some pitting  edema in the lower extremities.  The patient has never had any  history of presyncope or syncope.  UROGENITAL:  This system is not investigated.  NEUROPSYCHIATRIC:  The patient has had fatigue in the last two weeks with  his dyspnea on exertion but not weakness  or numbness or depression.  GI:  The patient has omeprazole for acid reflux.  ENDOCRINE:  The patient has a  history of diabetes.  A TSH this hospitalization is 1.390.  All other  systems are negative.   PHYSICAL EXAMINATION:  VITAL SIGNS:  Temperature is 98.6, pulse is 94 and  regular, respirations 20, blood pressure 128/83, oxygen saturation 98% on 2  liters nasal cannula.  GENERAL:  He is alert, oriented x 3, quite __________ to help in his care  and somewhat anxious about the prospect of catheterization on March 28, 2004.  HEENT:  Eyes, pupils are equal, round and reactive to light.  Extraocular  movements intact.  Sclerae are clear.  NECK:  Supple.  Mild jugular venous distention.  No carotid bruits  auscultated.  No cervical lymphadenopathy.  HEART:  Regular rate and rhythm S1 S2.  Auscultated S4.  LUNGS:  Clear to auscultation percussion bilaterally.  INTEGUMENT:  No rashes or lesions.  ABDOMEN:  The patient is obese.  No hepatosplenomegaly.  Unable to palpate  the deeper organs.  The patient is obese, weighing about 300 pounds.  UROGENITAL/RECTAL:  Deferred.  EXTREMITIES:  There is pitting edema of 1 to 2+ edema of the lower  extremities.  The patient says this is much better than it had been.  NEUROLOGIC:  Alert and oriented x 3.  Cranial nerves II-XII are intact.  No  focal deficits noted.  The patient has dorsalis pedis pulses 4/4 readily  palpable.  Radial pulses are 4/4.   Electrocardiogram shows sinus tachycardia, rate of 103, low voltage with  PACs.   LABORATORY STUDIES:  On March 26, 2004, include a complete blood count,  white cells 8.4, hemoglobin 13.5, hematocrit 39.9, platelets 251.  Serum  electrolytes, March 26, 2004, sodium 142, potassium 3.6, chloride 111,   BUN is 11, creatinine is 1.1, glucose is 100.  BNP was 364 on admission.  PT  was 13.8, INR 1.1.  PTT is 30.  Troponin I studies in serial fashion at  midnight, March 27, 2004, is less than 0.05, and then at about 4 in the  morning on March 27, 2004, 0.02.  D-dimmer was 0.48.  Fasting lipid profile:  Cholesterol 125, triglycerides 72, HDL cholesterol 25, and LDL cholesterol  is 86.   IMPRESSION:  1.  Admitted with two weeks increasing dyspnea, fatigue, and abdominal      discomfort with exertion.  2.  Ruled out for myocardial infarction with serial troponin I studies times      two.  3.  Abnormal exercise Cardiolite study, March 21, 2004, as dictated      above.  4.  Type 2 diabetes, diagnosed three years ago.  5.  Hypertension.  6.  Presentation paroxysmal supraventricular tachycardia in the emergency      department, converted to sinus rhythm with adenosine, then recurrent,      converted to sinus rhythm with intravenous Cardizem.  The patient     significantly denies any prior history of myocardial infarction,      cerebrovascular accident, gastrointestinal bleed, deep vein thrombosis,      or pulmonary embolism, or seizure.   PLAN:  1.  Left heart catheterization, March 28, 2004.  2.  Continue IV diuresis with Lasix increased to 80 mg IV at 8 this evening.  3.  Nocturnal oxygen saturations on continuous monitoring.  4.  DC Cardizem, begin Coreg 6.25 mg p.o. q.12h.  5.  Hemoglobin A1c in the morning and  C-MET in the morning.  6.  Agree with Lovenox subcutaneous injections for DVT prophylaxis,      potassium supplementation for IV Lasix diuresis.  7.  Further intervention as planned after results of left heart      catheterization available.   Dr. Graciela Husbands has seen the patient, interviewed him extensively, and has  formulated this plan as part of the cardiology contribution to this  patient's hospitalization, starting March 26, 2004.       ________________________________________  Maple Mirza, P.A.  ___________________________________________  Duke Salvia, M.D.    GM/MEDQ  D:  03/27/2004  T:  03/28/2004  Job:  [id number]

## 2010-06-14 NOTE — Assessment & Plan Note (Signed)
[facility]                                 ON-CALL NOTE   NAME:NEALMaeson, Purohit                           MRN:          [id number]  DATE:12/10/2008                            DOB:          09-Oct-1965    PRIMARY CARDIOLOGIST:  Pricilla Riffle, MD, [facility]   PROBLEM:  Mr. Chandley contacted me today complaining of progressive  swelling in the abdomen and lower extremities.  He otherwise denies  significant exertional dyspnea, orthopnea, or PND.  He is compliant with  his medications, including furosemide 80 b.i.d.  He is on  spironolactone, as well.  He is producing urine.  He does have a  scheduled followup with Dr. Tenny Craw and was last seen in the clinic on  November 06, 2008, with no medication adjustments noted at that time.   PLAN:  The patient was advised to take an additional dose of furosemide  80 mg today and, if no significant improvement in his edema, to contact  our office in the morning for further instructions and recommendations.  He was agreeable with this recommendation.     Gene Serpe, PA-C  Electronically Signed    GS/MedQ  DD: 12/10/2008  DT: 12/11/2008  Job #: [id number]

## 2010-06-25 ENCOUNTER — Encounter: Payer: Self-pay | Admitting: Internal Medicine

## 2010-07-05 ENCOUNTER — Ambulatory Visit (INDEPENDENT_AMBULATORY_CARE_PROVIDER_SITE_OTHER): Payer: Medicaid Other | Admitting: Internal Medicine

## 2010-07-05 ENCOUNTER — Encounter: Payer: Self-pay | Admitting: Internal Medicine

## 2010-07-05 VITALS — BP 118/74 | HR 84 | Ht 73.0 in | Wt 329.0 lb

## 2010-07-05 DIAGNOSIS — I509 Heart failure, unspecified: Secondary | ICD-10-CM

## 2010-07-05 DIAGNOSIS — I428 Other cardiomyopathies: Secondary | ICD-10-CM

## 2010-07-05 DIAGNOSIS — I10 Essential (primary) hypertension: Secondary | ICD-10-CM

## 2010-07-05 DIAGNOSIS — F17200 Nicotine dependence, unspecified, uncomplicated: Secondary | ICD-10-CM

## 2010-07-05 NOTE — Progress Notes (Signed)
HPI patient is a 45 year old with a history of NICM, hypertenision and renal insufficiency.  I saw him last August..  Echo last spring  showed LVEF was 30%.  RVEF was also down  Since I saw him he has been followed closely by A> Lowell Guitar.  He has been given Rx for diuretic to take as needed (Question Zaroxyln?) His breathing is good.  No chest pain.  NO edema. Still smoking a few cigs per day. No Known Allergies  Current Outpatient Prescriptions  Medication Sig Dispense Refill  . ACCU-CHEK SOFTCLIX LANCETS lancets Use to check blood sugar up to 1 time a day       . Blood Glucose Monitoring Suppl (ACCU-CHEK COMPACT CARE KIT) KIT Use to check blood sugar up to 1 time a day       . esomeprazole (NEXIUM) 20 MG capsule Take 1 capsule (20 mg total) by mouth daily before breakfast.  60 capsule  2  . fluticasone (FLONASE) 50 MCG/ACT nasal spray 2 sprays by Nasal route daily.  16 g  2  . glucose blood (ACCU-CHEK COMPACT TEST DRUM) test strip Use to test blood sugar up to 1 time a day       . Insulin Pen Needle (B-D UF III MINI PEN NEEDLES) 31G X 5 MM MISC Use to inject insulin as directed  100 each  6  . LANTUS SOLOSTAR 100 UNIT/ML injection INJECT 55 UNITS SUBCUTANEOUSLY ONCE AT NIGHT  1 Syringe  3  . loratadine (CLARITIN) 10 MG tablet Take 1 tablet (10 mg total) by mouth daily.  31 tablet  3  . metFORMIN (GLUCOPHAGE) 1000 MG tablet TAKE 1 TABLET BY MOUTH TWICE A DAY  62 tablet  2  . metoprolol (LOPRESSOR) 100 MG tablet Take 1 tablet (100 mg total) by mouth 2 (two) times daily.  60 tablet  6  . potassium chloride SA (K-DUR,KLOR-CON) 20 MEQ tablet Take 20 mEq by mouth 2 (two) times daily.        . quinapril (ACCUPRIL) 40 MG tablet Take 1 tablet (40 mg total) by mouth daily.  30 tablet  3  . simvastatin (ZOCOR) 40 MG tablet Take 1 tablet (40 mg total) by mouth daily.  30 tablet  3  . spironolactone (ALDACTONE) 25 MG tablet Take 1 tablet (25 mg total) by mouth daily.  30 tablet  3  . tadalafil (CIALIS) 20  MG tablet Take 1 tablet (20 mg total) by mouth as needed.  10 tablet  1  . torsemide (DEMADEX) 100 MG tablet Take 100 mg by mouth 2 (two) times daily.        Marland Kitchen DISCONTD: furosemide (LASIX) 80 MG tablet Take 2 tablets (160 mg total) by mouth 2 (two) times daily.  120 tablet  3    Past Medical History  Diagnosis Date  . Other and unspecified hyperlipidemia   . Insomnia, unspecified   . Obstructive sleep apnea (adult) (pediatric)   . Abdominal pain, left upper quadrant   . Allergic rhinitis, cause unspecified   . Other primary cardiomyopathies   . Unspecified essential hypertension   . Type II or unspecified type diabetes mellitus without mention of complication, not stated as uncontrolled   . Type II or unspecified type diabetes mellitus without mention of complication, not stated as uncontrolled   . Chronic kidney disease, stage I   . Esophageal reflux   . Morbid obesity   . Dysuria   . Tobacco use disorder  No past surgical history on file.  Family History  Problem Relation Age of Onset  . Diabetes Mother   . Microcephaly Father     History   Social History  . Marital Status: Single    Spouse Name: N/A    Number of Children: N/A  . Years of Education: N/A   Occupational History  . Not on file.   Social History Main Topics  . Smoking status: Current Everyday Smoker -- 0.4 packs/day    Types: Cigarettes  . Smokeless tobacco: Not on file   Comment: Will stop on his own  . Alcohol Use: No  . Drug Use: No  . Sexually Active: Not on file   Other Topics Concern  . Not on file   Social History Narrative  . No narrative on file    Review of Systems:  All systems reviewed.  They are negative to the above problem except as previously stated.  Vital Signs:   Physical Exam  HEENT:  Normocephalic, atraumatic. EOMI, PERRLA.  Neck: JVP is normal. No thyromegaly. No bruits.  Lungs: clear to auscultation. No rales no wheezes.  Heart: Regular rate and rhythm.  Normal S1, S2. No S3.   No significant murmurs. PMI not displaced.  Abdomen:  Supple, nontender. Normal bowel sounds. No masses. No hepatomegaly.  Extremities:   Good distal pulses throughout. No lower extremity edema.  Musculoskeletal :moving all extremities.  Neuro:   alert and oriented x3.  CN II-XII grossly intact.  EKG:  Possible ectopic atrial rhythm  84 bpm.  First degree AV block.  Low voltage EKG, cannot exclude anterior MI.   Assessment and Plan:

## 2010-07-05 NOTE — Patient Instructions (Signed)
Your physician wants you to follow-up in: December 2012 with Dr. Ross.You will receive a reminder letter in the mail two months in advance. If you don't receive a letter, please call our office to schedule the follow-up appointment.  

## 2010-07-07 NOTE — Assessment & Plan Note (Signed)
Volume status looks good.  Patient is to f/u with A. Powell.

## 2010-07-07 NOTE — Assessment & Plan Note (Signed)
counselled

## 2010-07-07 NOTE — Assessment & Plan Note (Signed)
Continue current regimen

## 2010-07-24 ENCOUNTER — Encounter: Payer: Self-pay | Admitting: Internal Medicine

## 2010-07-25 ENCOUNTER — Other Ambulatory Visit: Payer: Self-pay | Admitting: *Deleted

## 2010-07-26 MED ORDER — LORAZEPAM 0.5 MG PO TABS: 0.5000 mg | ORAL_TABLET | Freq: Two times a day (BID) | ORAL | Status: DC | PRN

## 2010-07-29 ENCOUNTER — Encounter: Payer: Self-pay | Admitting: Internal Medicine

## 2010-07-29 NOTE — Telephone Encounter (Signed)
[facility] 

## 2010-08-09 ENCOUNTER — Other Ambulatory Visit: Payer: Self-pay | Admitting: *Deleted

## 2010-08-09 DIAGNOSIS — E785 Hyperlipidemia, unspecified: Secondary | ICD-10-CM

## 2010-08-09 MED ORDER — SIMVASTATIN 40 MG PO TABS: 40.0000 mg | ORAL_TABLET | Freq: Every day | ORAL | Status: DC

## 2010-08-12 ENCOUNTER — Other Ambulatory Visit: Payer: Self-pay | Admitting: *Deleted

## 2010-08-12 DIAGNOSIS — K219 Gastro-esophageal reflux disease without esophagitis: Secondary | ICD-10-CM

## 2010-08-13 MED ORDER — ESOMEPRAZOLE MAGNESIUM 20 MG PO CPDR: 20.0000 mg | DELAYED_RELEASE_CAPSULE | Freq: Every day | ORAL | Status: DC

## 2010-08-13 MED ORDER — METFORMIN HCL 1000 MG PO TABS: 1000.0000 mg | ORAL_TABLET | Freq: Two times a day (BID) | ORAL | Status: DC

## 2010-09-09 NOTE — Progress Notes (Signed)
Addended by: Bufford Spikes on: 09/09/2010 02:29 PM   Modules accepted: Orders

## 2010-09-15 ENCOUNTER — Other Ambulatory Visit: Payer: Self-pay | Admitting: Internal Medicine

## 2010-09-16 ENCOUNTER — Other Ambulatory Visit: Payer: Self-pay | Admitting: *Deleted

## 2010-09-16 MED ORDER — LORAZEPAM 0.5 MG PO TABS: 0.5000 mg | ORAL_TABLET | Freq: Two times a day (BID) | ORAL | Status: DC

## 2010-09-16 NOTE — Telephone Encounter (Signed)
Refill for Ativan called to the [facility].

## 2010-10-06 ENCOUNTER — Other Ambulatory Visit: Payer: Self-pay | Admitting: Internal Medicine

## 2010-10-16 ENCOUNTER — Other Ambulatory Visit: Payer: Self-pay | Admitting: *Deleted

## 2010-10-16 DIAGNOSIS — I10 Essential (primary) hypertension: Secondary | ICD-10-CM

## 2010-10-16 MED ORDER — SPIRONOLACTONE 25 MG PO TABS: 25.0000 mg | ORAL_TABLET | Freq: Every day | ORAL | Status: DC

## 2010-10-16 NOTE — Telephone Encounter (Signed)
Last OV 05-23-10.

## 2010-10-25 ENCOUNTER — Other Ambulatory Visit: Payer: Self-pay | Admitting: Internal Medicine

## 2010-11-04 LAB — DIFFERENTIAL
Eosinophils Absolute: 0.1 — ABNORMAL LOW
Eosinophils Relative: 1
Lymphs Abs: 1.7
Monocytes Absolute: 0.5
Monocytes Relative: 6

## 2010-11-04 LAB — CBC
HCT: 39.3
Hemoglobin: 13.2
MCV: 91.5
WBC: 7.7

## 2010-11-04 LAB — COMPREHENSIVE METABOLIC PANEL
Alkaline Phosphatase: 50
BUN: 13
CO2: 25
Chloride: 108
GFR calc non Af Amer: 56 — ABNORMAL LOW
Glucose, Bld: 143 — ABNORMAL HIGH
Potassium: 4
Total Bilirubin: 1.7 — ABNORMAL HIGH

## 2010-11-19 ENCOUNTER — Ambulatory Visit (HOSPITAL_COMMUNITY)
Admission: RE | Admit: 2010-11-19 | Discharge: 2010-11-19 | Disposition: A | Discharge status: Home / Self Care | Payer: Medicaid Other | Source: Ambulatory Visit | Attending: Internal Medicine | Admitting: Internal Medicine

## 2010-11-19 ENCOUNTER — Encounter: Payer: Self-pay | Admitting: Internal Medicine

## 2010-11-19 ENCOUNTER — Ambulatory Visit (INDEPENDENT_AMBULATORY_CARE_PROVIDER_SITE_OTHER): Payer: Medicaid Other | Admitting: Internal Medicine

## 2010-11-19 VITALS — BP 108/70 | HR 85 | Temp 97.1°F | Ht 73.0 in | Wt 346.1 lb

## 2010-11-19 DIAGNOSIS — E785 Hyperlipidemia, unspecified: Secondary | ICD-10-CM

## 2010-11-19 DIAGNOSIS — M79609 Pain in unspecified limb: Secondary | ICD-10-CM

## 2010-11-19 DIAGNOSIS — J302 Other seasonal allergic rhinitis: Secondary | ICD-10-CM

## 2010-11-19 DIAGNOSIS — I83812 Varicose veins of left lower extremities with pain: Secondary | ICD-10-CM

## 2010-11-19 DIAGNOSIS — E119 Type 2 diabetes mellitus without complications: Secondary | ICD-10-CM

## 2010-11-19 DIAGNOSIS — K219 Gastro-esophageal reflux disease without esophagitis: Secondary | ICD-10-CM

## 2010-11-19 DIAGNOSIS — I83893 Varicose veins of bilateral lower extremities with other complications: Secondary | ICD-10-CM

## 2010-11-19 DIAGNOSIS — I8390 Asymptomatic varicose veins of unspecified lower extremity: Secondary | ICD-10-CM | POA: Insufficient documentation

## 2010-11-19 DIAGNOSIS — N529 Male erectile dysfunction, unspecified: Secondary | ICD-10-CM

## 2010-11-19 DIAGNOSIS — F419 Anxiety disorder, unspecified: Secondary | ICD-10-CM

## 2010-11-19 DIAGNOSIS — F411 Generalized anxiety disorder: Secondary | ICD-10-CM

## 2010-11-19 DIAGNOSIS — M7989 Other specified soft tissue disorders: Secondary | ICD-10-CM

## 2010-11-19 DIAGNOSIS — J309 Allergic rhinitis, unspecified: Secondary | ICD-10-CM

## 2010-11-19 DIAGNOSIS — I10 Essential (primary) hypertension: Secondary | ICD-10-CM

## 2010-11-19 MED ORDER — METOPROLOL TARTRATE 100 MG PO TABS: 100.0000 mg | ORAL_TABLET | Freq: Two times a day (BID) | ORAL | Status: DC

## 2010-11-19 MED ORDER — SPIRONOLACTONE 25 MG PO TABS: 25.0000 mg | ORAL_TABLET | Freq: Every day | ORAL | Status: DC

## 2010-11-19 MED ORDER — METFORMIN HCL 1000 MG PO TABS: 1000.0000 mg | ORAL_TABLET | Freq: Two times a day (BID) | ORAL | Status: DC

## 2010-11-19 MED ORDER — ESOMEPRAZOLE MAGNESIUM 20 MG PO CPDR: 20.0000 mg | DELAYED_RELEASE_CAPSULE | Freq: Every day | ORAL | Status: DC

## 2010-11-19 MED ORDER — INSULIN PEN NEEDLE 31G X 5 MM MISC: Status: DC

## 2010-11-19 MED ORDER — QUINAPRIL HCL 40 MG PO TABS: 40.0000 mg | ORAL_TABLET | Freq: Every day | ORAL | Status: DC

## 2010-11-19 MED ORDER — TORSEMIDE 100 MG PO TABS: 100.0000 mg | ORAL_TABLET | Freq: Two times a day (BID) | ORAL | Status: DC

## 2010-11-19 MED ORDER — ACCU-CHEK SOFTCLIX LANCETS MISC: Status: DC

## 2010-11-19 MED ORDER — SIMVASTATIN 40 MG PO TABS: 40.0000 mg | ORAL_TABLET | Freq: Every day | ORAL | Status: DC

## 2010-11-19 MED ORDER — GLUCOSE BLOOD VI STRP: ORAL_STRIP | Status: DC

## 2010-11-19 MED ORDER — POTASSIUM CHLORIDE CRYS ER 20 MEQ PO TBCR: 20.0000 meq | EXTENDED_RELEASE_TABLET | Freq: Every day | ORAL | Status: DC

## 2010-11-19 MED ORDER — INSULIN GLARGINE 100 UNIT/ML ~~LOC~~ SOLN: SUBCUTANEOUS | Status: DC

## 2010-11-19 MED ORDER — LORAZEPAM 0.5 MG PO TABS: 0.5000 mg | ORAL_TABLET | Freq: Two times a day (BID) | ORAL | Status: DC

## 2010-11-19 MED ORDER — B-4 MED COMPRESSION HOSE MENS MISC: 1.0000 | Status: DC

## 2010-11-19 MED ORDER — FLUTICASONE PROPIONATE 50 MCG/ACT NA SUSP: 2.0000 | Freq: Every day | NASAL | Status: DC

## 2010-11-19 MED ORDER — TADALAFIL 20 MG PO TABS: 20.0000 mg | ORAL_TABLET | ORAL | Status: DC | PRN

## 2010-11-19 NOTE — Progress Notes (Deleted)
  Subjective:    Patient ID: Adam [name], male    DOB: March 25, 1965, 45 y.o.   MRN: [id number]  HPI    Review of Systems     Objective:   Physical Exam        Assessment & Plan:

## 2010-11-19 NOTE — Patient Instructions (Signed)
Please, take Tylenol on as needed basis -do not take more than 6 tablets a day. Please, call with any questions and follow up on as needed basis.

## 2010-11-19 NOTE — Progress Notes (Signed)
HPI: 1. Left knee and leg pain for 8 days after climbing and falling off the ladder. Pain developed gradually up to 10/10 in inensity 7 days ago. Patient took Tylenol which "helped some." Patient denies SOB, CP, palpitation, dizziness, hx of blood clots; fever or chills. Patient states that his knee "sometimes gives way" but no falls were taken --patient uses a walking cane.  Past Medical History  Diagnosis Date  . Other and unspecified hyperlipidemia   . Insomnia, unspecified   . Obstructive sleep apnea (adult) (pediatric)   . Abdominal pain, left upper quadrant   . Allergic rhinitis, cause unspecified   . Other primary cardiomyopathies   . Unspecified essential hypertension   . Type II or unspecified type diabetes mellitus without mention of complication, not stated as uncontrolled   . Type II or unspecified type diabetes mellitus without mention of complication, not stated as uncontrolled   . Chronic kidney disease, stage I   . Esophageal reflux   . Morbid obesity   . Dysuria   . Tobacco use disorder    Current Outpatient Prescriptions  Medication Sig Dispense Refill  . ACCU-CHEK SOFTCLIX LANCETS lancets Use to check blood sugar up to 1 time a day  100 each  4  . Blood Glucose Monitoring Suppl (ACCU-CHEK COMPACT CARE KIT) KIT Use to check blood sugar up to 1 time a day       . esomeprazole (NEXIUM) 20 MG capsule Take 1 capsule (20 mg total) by mouth daily before breakfast.  60 capsule  11  . fluticasone (FLONASE) 50 MCG/ACT nasal spray Place 2 sprays into the nose daily.  16 g  2  . glucose blood (ACCU-CHEK COMPACT TEST DRUM) test strip Use to test blood sugar up to 1 time a day  100 each  4  . insulin glargine (LANTUS SOLOSTAR) 100 UNIT/ML injection Inject 55 Units subcutaneously once before bedtime  10 mL  11  . Insulin Pen Needle (B-D UF III MINI PEN NEEDLES) 31G X 5 MM MISC Use to inject insulin as directed  100 each  6  . LORazepam (ATIVAN) 0.5 MG tablet Take 1 tablet (0.5 mg  total) by mouth 2 (two) times daily.  60 tablet  5  . LORazepam (ATIVAN) 0.5 MG tablet Take 0.5 mg by mouth 2 (two) times daily as needed.        . metFORMIN (GLUCOPHAGE) 1000 MG tablet Take 1 tablet (1,000 mg total) by mouth 2 (two) times daily with a meal.  62 tablet  11  . metoprolol (LOPRESSOR) 100 MG tablet Take 1 tablet (100 mg total) by mouth 2 (two) times daily.  62 tablet  11  . potassium chloride SA (KLOR-CON M20) 20 MEQ tablet Take 1 tablet (20 mEq total) by mouth daily.  60 tablet  3  . quinapril (ACCUPRIL) 40 MG tablet Take 1 tablet (40 mg total) by mouth daily.  30 tablet  11  . simvastatin (ZOCOR) 40 MG tablet Take 1 tablet (40 mg total) by mouth daily.  30 tablet  11  . spironolactone (ALDACTONE) 25 MG tablet Take 1 tablet (25 mg total) by mouth daily.  31 tablet  11  . tadalafil (CIALIS) 20 MG tablet Take 1 tablet (20 mg total) by mouth as needed.  10 tablet  5  . torsemide (DEMADEX) 100 MG tablet Take 1 tablet (100 mg total) by mouth 2 (two) times daily.  60 tablet  11  . DISCONTD: LORazepam (ATIVAN) 0.5 MG tablet  Take 1 tablet (0.5 mg total) by mouth 2 (two) times daily.  30 tablet  0  . DISCONTD: LORazepam (ATIVAN) 0.5 MG tablet Take 1 tablet (0.5 mg total) by mouth 2 (two) times daily as needed (For nerves.).  30 tablet  0  . DISCONTD: LORazepam (ATIVAN) 0.5 MG tablet Take 1 tablet (0.5 mg total) by mouth 2 (two) times daily as needed (for nerves).  60 tablet  0  . DISCONTD: ACCU-CHEK SOFTCLIX LANCETS lancets Use to check blood sugar up to 1 time a day       . DISCONTD: esomeprazole (NEXIUM) 20 MG capsule Take 1 capsule (20 mg total) by mouth daily before breakfast.  60 capsule  6  . DISCONTD: fluticasone (FLONASE) 50 MCG/ACT nasal spray 2 sprays by Nasal route daily.  16 g  2  . DISCONTD: glucose blood (ACCU-CHEK COMPACT TEST DRUM) test strip Use to test blood sugar up to 1 time a day       . DISCONTD: Insulin Pen Needle (B-D UF III MINI PEN NEEDLES) 31G X 5 MM MISC Use to  inject insulin as directed  100 each  6  . DISCONTD: KLOR-CON M20 20 MEQ tablet TAKE 1 TABLET BY MOUTH TWICE A DAY  60 tablet  3  . DISCONTD: LANTUS SOLOSTAR 100 UNIT/ML injection INJECT 55 UNITS SUBCUTANEOUSLY ONCE AT NIGHT  15 Syringe  3  . DISCONTD: LORazepam (ATIVAN) 0.5 MG tablet Take 1 tablet (0.5 mg total) by mouth 2 (two) times daily.  60 tablet  0  . DISCONTD: metFORMIN (GLUCOPHAGE) 1000 MG tablet Take 1 tablet (1,000 mg total) by mouth 2 (two) times daily with a meal.  62 tablet  6  . DISCONTD: metoprolol (LOPRESSOR) 100 MG tablet TAKE 1 TABLET BY MOUTH TWICE A DAY  62 tablet  6  . DISCONTD: quinapril (ACCUPRIL) 40 MG tablet Take 1 tablet (40 mg total) by mouth daily.  30 tablet  3  . DISCONTD: simvastatin (ZOCOR) 40 MG tablet Take 1 tablet (40 mg total) by mouth daily.  30 tablet  3  . DISCONTD: spironolactone (ALDACTONE) 25 MG tablet Take 1 tablet (25 mg total) by mouth daily.  31 tablet  11  . DISCONTD: tadalafil (CIALIS) 20 MG tablet Take 1 tablet (20 mg total) by mouth as needed.  10 tablet  1  . DISCONTD: torsemide (DEMADEX) 100 MG tablet Take 100 mg by mouth 2 (two) times daily.         Family History  Problem Relation Age of Onset  . Diabetes Mother   . Microcephaly Father    History   Social History  . Marital Status: Single    Spouse Name: N/A    Number of Children: N/A  . Years of Education: N/A   Social History Main Topics  . Smoking status: Current Everyday Smoker -- 0.5 packs/day    Types: Cigarettes  . Smokeless tobacco: None   Comment: Will stop on his own  . Alcohol Use: No  . Drug Use: No  . Sexually Active: None   Other Topics Concern  . None   Social History Narrative  . None    Review of Systems: Constitutional: Denies fever, chills, diaphoresis, appetite change and fatigue.  HEENT: Denies photophobia, eye pain, redness, hearing loss, ear pain, congestion, sore throat, rhinorrhea, sneezing, mouth sores, trouble swallowing, neck pain, neck  stiffness and tinnitus.  Respiratory: Denies SOB, DOE, cough, chest tightness, and wheezing.  Cardiovascular: Denies chest pain, palpitations and  leg swelling.  Gastrointestinal: Denies nausea, vomiting, abdominal pain, diarrhea, constipation, blood in stool and abdominal distention.  Genitourinary: Denies dysuria, urgency, frequency, hematuria, flank pain and difficulty urinating.  Musculoskeletal: Denies myalgias, back pain, joint swelling, arthralgias and gait problem.  Skin: Denies pallor, rash and wound.  Neurological: Denies dizziness, seizures, syncope, weakness, light-headedness, numbness and headaches.  Hematological: Denies adenopathy. Easy bruising, personal or family bleeding history  Psychiatric/Behavioral: Denies suicidal ideation, mood changes, confusion, nervousness, sleep disturbance and agitation   Vitals: reviewed General: alert, well-developed, and cooperative to examination.  Head: normocephalic and atraumatic.  Eyes: vision grossly intact, pupils equal, pupils round, pupils reactive to light, no injection and anicteric.  Mouth: pharynx pink and moist, no erythema, and no exudates.  Neck: supple, full ROM, no thyromegaly, no JVD, and no carotid bruits.  Lungs: normal respiratory effort, no accessory muscle use, normal breath sounds, no crackles, and no wheezes. Heart: normal rate, regular rhythm, no murmur, no gallop, and no rub.  Abdomen: soft, non-tender, normal bowel sounds, no distention, no guarding, no rebound tenderness, no hepatomegaly, and no splenomegaly.  JYN:WGNF knee joint swelling, no joint warmth, and no redness over joints.  Pulses: 2+ DP/PT pulses in right LE and 1+ pedal pulses in left LE; Extremities: left leg with 3+/4 pitting edema; acute calf tenderness with palpation noted; stasis dermatitis changes to both ankles noted; Neurologic: alert & oriented X3, cranial nerves II-XII intact, strength normal in all extremities, sensation intact to light touch,  and gait normal.  Skin: turgor normal and no rashes.  Psych: Oriented X3, memory intact for recent and remote, normally interactive, good eye contact, not anxious appearing, and not depressed appearing.    Assessment & Plan:  1. Left calf pain with edema of 8 days duration sp trauma -Venous doppler STAT to r/o DVT  2. Left knee pain sp trauma -Xray of knee to evaluate for fracture  3. DM, insulin-dependent. - excellent improvement with an adjustment of Lantus dose up to 55 Units SQ qhs -diet, exercise emphasized -foot care reviewed with the patient. -refused flu and pneumonia vaccinations

## 2010-11-22 ENCOUNTER — Telehealth: Payer: Self-pay | Admitting: *Deleted

## 2010-11-22 NOTE — Telephone Encounter (Signed)
Pt called to let you know he is still having pain when he presses on knee.  The fluid is present, and wants to know how long before it goes away. He can move but still hurts.  Tylenol is not helping. Pt # [id number]

## 2010-11-26 NOTE — Telephone Encounter (Signed)
Called the patient. Patient reports that the swelling of his knee and leg is improving; Tylenol does not alleviate the pain "when he presses on his knee." Instructed to avoid self-manipulation of his knee; switch to Aleve Prn with meals and apply ice packs PRN.

## 2010-12-26 ENCOUNTER — Ambulatory Visit (INDEPENDENT_AMBULATORY_CARE_PROVIDER_SITE_OTHER): Payer: Medicaid Other | Admitting: Internal Medicine

## 2010-12-26 ENCOUNTER — Encounter: Payer: Self-pay | Admitting: Internal Medicine

## 2010-12-26 DIAGNOSIS — E119 Type 2 diabetes mellitus without complications: Secondary | ICD-10-CM

## 2010-12-27 NOTE — Progress Notes (Signed)
error 

## 2010-12-30 ENCOUNTER — Other Ambulatory Visit: Payer: Self-pay | Admitting: *Deleted

## 2010-12-30 DIAGNOSIS — E785 Hyperlipidemia, unspecified: Secondary | ICD-10-CM

## 2010-12-30 DIAGNOSIS — F419 Anxiety disorder, unspecified: Secondary | ICD-10-CM

## 2010-12-30 DIAGNOSIS — I10 Essential (primary) hypertension: Secondary | ICD-10-CM

## 2010-12-30 DIAGNOSIS — I83812 Varicose veins of left lower extremities with pain: Secondary | ICD-10-CM

## 2010-12-30 DIAGNOSIS — J302 Other seasonal allergic rhinitis: Secondary | ICD-10-CM

## 2010-12-30 DIAGNOSIS — N529 Male erectile dysfunction, unspecified: Secondary | ICD-10-CM

## 2010-12-30 DIAGNOSIS — K219 Gastro-esophageal reflux disease without esophagitis: Secondary | ICD-10-CM

## 2010-12-30 DIAGNOSIS — E119 Type 2 diabetes mellitus without complications: Secondary | ICD-10-CM

## 2010-12-30 MED ORDER — ESOMEPRAZOLE MAGNESIUM 20 MG PO CPDR: 40.0000 mg | DELAYED_RELEASE_CAPSULE | Freq: Every day | ORAL | Status: DC

## 2010-12-30 NOTE — Telephone Encounter (Signed)
Received a note from pharmacy stating pt is taking 2 caps a day of nexium.  They need a new Rx sent in

## 2010-12-30 NOTE — Telephone Encounter (Signed)
It was entered into EPIC as 2 per day but wen first refilled was Rx'd as QD. Will refill as BID and inform PCP

## 2010-12-31 ENCOUNTER — Encounter (HOSPITAL_COMMUNITY): Payer: Self-pay

## 2010-12-31 ENCOUNTER — Telehealth: Payer: Self-pay | Admitting: *Deleted

## 2010-12-31 ENCOUNTER — Inpatient Hospital Stay (HOSPITAL_COMMUNITY)
Admission: EM | Admit: 2010-12-31 | Discharge: 2011-01-04 | DRG: 378 | Disposition: A | Discharge status: Home / Self Care | Payer: Medicaid Other | Attending: Internal Medicine | Admitting: Internal Medicine

## 2010-12-31 DIAGNOSIS — N181 Chronic kidney disease, stage 1: Secondary | ICD-10-CM | POA: Diagnosis present

## 2010-12-31 DIAGNOSIS — D649 Anemia, unspecified: Secondary | ICD-10-CM

## 2010-12-31 DIAGNOSIS — K259 Gastric ulcer, unspecified as acute or chronic, without hemorrhage or perforation: Secondary | ICD-10-CM | POA: Diagnosis present

## 2010-12-31 DIAGNOSIS — I129 Hypertensive chronic kidney disease with stage 1 through stage 4 chronic kidney disease, or unspecified chronic kidney disease: Secondary | ICD-10-CM | POA: Diagnosis present

## 2010-12-31 DIAGNOSIS — G4700 Insomnia, unspecified: Secondary | ICD-10-CM | POA: Diagnosis present

## 2010-12-31 DIAGNOSIS — N19 Unspecified kidney failure: Secondary | ICD-10-CM

## 2010-12-31 DIAGNOSIS — I83812 Varicose veins of left lower extremities with pain: Secondary | ICD-10-CM

## 2010-12-31 DIAGNOSIS — D631 Anemia in chronic kidney disease: Secondary | ICD-10-CM | POA: Diagnosis present

## 2010-12-31 DIAGNOSIS — I10 Essential (primary) hypertension: Secondary | ICD-10-CM

## 2010-12-31 DIAGNOSIS — N529 Male erectile dysfunction, unspecified: Secondary | ICD-10-CM

## 2010-12-31 DIAGNOSIS — I959 Hypotension, unspecified: Secondary | ICD-10-CM | POA: Diagnosis present

## 2010-12-31 DIAGNOSIS — F411 Generalized anxiety disorder: Secondary | ICD-10-CM | POA: Diagnosis present

## 2010-12-31 DIAGNOSIS — I428 Other cardiomyopathies: Secondary | ICD-10-CM | POA: Diagnosis present

## 2010-12-31 DIAGNOSIS — E785 Hyperlipidemia, unspecified: Secondary | ICD-10-CM

## 2010-12-31 DIAGNOSIS — I8390 Asymptomatic varicose veins of unspecified lower extremity: Secondary | ICD-10-CM | POA: Diagnosis present

## 2010-12-31 DIAGNOSIS — I509 Heart failure, unspecified: Secondary | ICD-10-CM

## 2010-12-31 DIAGNOSIS — Z794 Long term (current) use of insulin: Secondary | ICD-10-CM

## 2010-12-31 DIAGNOSIS — F419 Anxiety disorder, unspecified: Secondary | ICD-10-CM

## 2010-12-31 DIAGNOSIS — K254 Chronic or unspecified gastric ulcer with hemorrhage: Principal | ICD-10-CM | POA: Diagnosis present

## 2010-12-31 DIAGNOSIS — N039 Chronic nephritic syndrome with unspecified morphologic changes: Secondary | ICD-10-CM | POA: Diagnosis present

## 2010-12-31 DIAGNOSIS — Z95810 Presence of automatic (implantable) cardiac defibrillator: Secondary | ICD-10-CM

## 2010-12-31 DIAGNOSIS — F17200 Nicotine dependence, unspecified, uncomplicated: Secondary | ICD-10-CM | POA: Diagnosis present

## 2010-12-31 DIAGNOSIS — K219 Gastro-esophageal reflux disease without esophagitis: Secondary | ICD-10-CM

## 2010-12-31 DIAGNOSIS — N179 Acute kidney failure, unspecified: Secondary | ICD-10-CM | POA: Diagnosis present

## 2010-12-31 DIAGNOSIS — E119 Type 2 diabetes mellitus without complications: Secondary | ICD-10-CM

## 2010-12-31 DIAGNOSIS — E1129 Type 2 diabetes mellitus with other diabetic kidney complication: Secondary | ICD-10-CM | POA: Diagnosis present

## 2010-12-31 DIAGNOSIS — Z6841 Body Mass Index (BMI) 40.0 and over, adult: Secondary | ICD-10-CM

## 2010-12-31 DIAGNOSIS — K922 Gastrointestinal hemorrhage, unspecified: Secondary | ICD-10-CM

## 2010-12-31 DIAGNOSIS — J302 Other seasonal allergic rhinitis: Secondary | ICD-10-CM

## 2010-12-31 DIAGNOSIS — D500 Iron deficiency anemia secondary to blood loss (chronic): Secondary | ICD-10-CM | POA: Diagnosis present

## 2010-12-31 DIAGNOSIS — G4733 Obstructive sleep apnea (adult) (pediatric): Secondary | ICD-10-CM | POA: Diagnosis present

## 2010-12-31 LAB — CBC
Hemoglobin: 7.5 g/dL — ABNORMAL LOW (ref 13.0–17.0)
MCH: 23.4 pg — ABNORMAL LOW (ref 26.0–34.0)
MCV: 77.6 fL — ABNORMAL LOW (ref 78.0–100.0)
RBC: 3.21 MIL/uL — ABNORMAL LOW (ref 4.22–5.81)

## 2010-12-31 LAB — COMPREHENSIVE METABOLIC PANEL
ALT: 8 U/L (ref 0–53)
CO2: 21 mEq/L (ref 19–32)
Calcium: 9.7 mg/dL (ref 8.4–10.5)
Creatinine, Ser: 3.71 mg/dL — ABNORMAL HIGH (ref 0.50–1.35)
GFR calc Af Amer: 21 mL/min — ABNORMAL LOW (ref >90)
GFR calc non Af Amer: 18 mL/min — ABNORMAL LOW (ref >90)
Glucose, Bld: 256 mg/dL — ABNORMAL HIGH (ref 70–99)

## 2010-12-31 MED ORDER — ONDANSETRON HCL 4 MG/2ML IJ SOLN: 4.0000 mg | Freq: Four times a day (QID) | INTRAMUSCULAR | Status: DC | PRN

## 2010-12-31 MED ORDER — METOPROLOL TARTRATE 100 MG PO TABS
100.0000 mg | ORAL_TABLET | Freq: Two times a day (BID) | ORAL | Status: DC
  Administered 2011-01-01: 100 mg via ORAL
  Filled 2010-12-31 (×3): qty 1

## 2010-12-31 MED ORDER — DOCUSATE SODIUM 100 MG PO CAPS
100.0000 mg | ORAL_CAPSULE | Freq: Two times a day (BID) | ORAL | Status: DC
  Administered 2011-01-01 – 2011-01-03 (×7): 100 mg via ORAL
  Filled 2010-12-31 (×9): qty 1

## 2010-12-31 MED ORDER — ONDANSETRON HCL 4 MG PO TABS: 4.0000 mg | ORAL_TABLET | Freq: Four times a day (QID) | ORAL | Status: DC | PRN

## 2010-12-31 MED ORDER — PANTOPRAZOLE SODIUM 40 MG IV SOLR
40.0000 mg | Freq: Every day | INTRAVENOUS | Status: DC
  Administered 2011-01-01 (×2): 40 mg via INTRAVENOUS
  Filled 2010-12-31 (×3): qty 40

## 2010-12-31 MED ORDER — LORAZEPAM 0.5 MG PO TABS
0.5000 mg | ORAL_TABLET | Freq: Two times a day (BID) | ORAL | Status: DC | PRN
  Administered 2011-01-01: 0.5 mg via ORAL
  Filled 2010-12-31: qty 1

## 2010-12-31 MED ORDER — INSULIN ASPART 100 UNIT/ML ~~LOC~~ SOLN
0.0000 [IU] | Freq: Three times a day (TID) | SUBCUTANEOUS | Status: DC
  Administered 2011-01-01: 2 [IU] via SUBCUTANEOUS
  Administered 2011-01-01 (×2): 5 [IU] via SUBCUTANEOUS
  Administered 2011-01-01: 8 [IU] via SUBCUTANEOUS
  Administered 2011-01-02: 5 [IU] via SUBCUTANEOUS
  Administered 2011-01-02: 8 [IU] via SUBCUTANEOUS
  Administered 2011-01-02: 3 [IU] via SUBCUTANEOUS
  Administered 2011-01-02: 5 [IU] via SUBCUTANEOUS
  Administered 2011-01-03 (×2): 8 [IU] via SUBCUTANEOUS
  Administered 2011-01-03 (×2): 3 [IU] via SUBCUTANEOUS
  Administered 2011-01-04: 5 [IU] via SUBCUTANEOUS
  Filled 2010-12-31 (×2): qty 3

## 2010-12-31 MED ORDER — SIMVASTATIN 40 MG PO TABS
40.0000 mg | ORAL_TABLET | Freq: Every day | ORAL | Status: DC
  Administered 2011-01-01 – 2011-01-04 (×4): 40 mg via ORAL
  Filled 2010-12-31 (×4): qty 1

## 2010-12-31 MED ORDER — ACETAMINOPHEN 650 MG RE SUPP: 650.0000 mg | Freq: Four times a day (QID) | RECTAL | Status: DC | PRN

## 2010-12-31 MED ORDER — ACETAMINOPHEN 325 MG PO TABS
650.0000 mg | ORAL_TABLET | Freq: Four times a day (QID) | ORAL | Status: DC | PRN
  Administered 2011-01-02: 650 mg via ORAL
  Filled 2010-12-31: qty 2

## 2010-12-31 MED ORDER — SODIUM CHLORIDE 0.9 % IV SOLN
250.0000 mL | INTRAVENOUS | Status: DC | PRN
  Administered 2011-01-02: 500 mL via INTRAVENOUS

## 2010-12-31 MED ORDER — SODIUM CHLORIDE 0.9 % IJ SOLN
3.0000 mL | Freq: Two times a day (BID) | INTRAMUSCULAR | Status: DC
  Administered 2011-01-01 – 2011-01-04 (×7): 3 mL via INTRAVENOUS

## 2010-12-31 NOTE — ED Provider Notes (Signed)
History     CSN: [id number] Arrival date & time: 12/31/2010  5:28 PM   First MD Initiated Contact with Patient 12/31/10 1925      Chief Complaint  Patient presents with  . Abnormal Lab     HPI  45yo male history of hypertension, insulin-dependent diabetes presents with concern for anemia. The patient states that he had outpatient laboratory and was told he was anemic to 7.9 and told to come to the emergency department for evaluation. The patient states that he feels "fine". He denies headache, dizziness, chest pain, shortness of breath, fever, chills. He denies abdominal pain, nausea, vomiting. He denies weakness. He denies constipation and diarrhea. He states that he is having normal bowel movements without dark stools or blood in the stool.  No history of GI bleed. He denies use of anticoagulants   ED Notes, ED Provider Notes from 12/31/10 0000 to 12/31/10 17:49:07       Gerarda Fraction, RN 12/31/2010 17:33      Pt sts, "they said my blood was 7.9."         Gerarda Fraction, RN 12/31/2010 17:32      Pt reports Dr. Madelyn Flavors rn called him today instructing pt to come to ED for further evaluation of abnormal blood work. Pt unsure of which test were done, sts "they said I'm loosing blood."     Past Medical History  Diagnosis Date  . Other and unspecified hyperlipidemia   . Insomnia, unspecified   . Obstructive sleep apnea (adult) (pediatric)   . Abdominal pain, left upper quadrant   . Allergic rhinitis, cause unspecified   . Other primary cardiomyopathies   . Unspecified essential hypertension   . Type II or unspecified type diabetes mellitus without mention of complication, not stated as uncontrolled   . Type II or unspecified type diabetes mellitus without mention of complication, not stated as uncontrolled   . Chronic kidney disease, stage I   . Esophageal reflux   . Morbid obesity   . Dysuria   . Tobacco use disorder     History reviewed. No pertinent past surgical  history.  Family History  Problem Relation Age of Onset  . Diabetes Mother   . Microcephaly Father     History  Substance Use Topics  . Smoking status: Current Everyday Smoker    Types: Cigarettes  . Smokeless tobacco: Not on file   Comment: Will stop on his own  . Alcohol Use: No      Review of Systems  All other systems reviewed and are negative.   except as noted HPI   Allergies  Review of patient's allergies indicates no known allergies.  Home Medications   Current Outpatient Rx  Name Route Sig Dispense Refill  . ESOMEPRAZOLE MAGNESIUM 20 MG PO CPDR Oral Take 2 capsules (40 mg total) by mouth daily before breakfast. 60 capsule 11  . FLUTICASONE PROPIONATE 50 MCG/ACT NA SUSP Nasal Place 2 sprays into the nose daily. 16 g 2  . INSULIN GLARGINE 100 UNIT/ML [place] SOLN Subcutaneous Inject 55 Units into the skin at bedtime.      Marland Kitchen LORAZEPAM 0.5 MG PO TABS Oral Take 0.5 mg by mouth 2 (two) times daily as needed.      Marland Kitchen METFORMIN HCL 1000 MG PO TABS Oral Take 1 tablet (1,000 mg total) by mouth 2 (two) times daily with a meal. 62 tablet 11  . METOPROLOL TARTRATE 100 MG PO TABS Oral Take 1 tablet (100  mg total) by mouth 2 (two) times daily. 62 tablet 11  . POTASSIUM CHLORIDE CRYS CR 20 MEQ PO TBCR Oral Take 1 tablet (20 mEq total) by mouth daily. 60 tablet 3  . QUINAPRIL HCL 40 MG PO TABS Oral Take 1 tablet (40 mg total) by mouth daily. 30 tablet 11  . SIMVASTATIN 40 MG PO TABS Oral Take 1 tablet (40 mg total) by mouth daily. 30 tablet 11  . SPIRONOLACTONE 25 MG PO TABS Oral Take 1 tablet (25 mg total) by mouth daily. 31 tablet 11  . TADALAFIL 20 MG PO TABS Oral Take 1 tablet (20 mg total) by mouth as needed. 10 tablet 5  . TORSEMIDE 100 MG PO TABS Oral Take 1 tablet (100 mg total) by mouth 2 (two) times daily. 60 tablet 11    BP 102/56  Pulse 83  Temp(Src) 98.7 F (37.1 C) (Oral)  Resp 20  SpO2 96%  Physical Exam  Nursing note and vitals reviewed. Constitutional: He is  oriented to person, place, and time. He appears well-developed and well-nourished. No distress.  HENT:  Head: Atraumatic.  Mouth/Throat: Oropharynx is clear and moist.  Eyes: Conjunctivae are normal. Pupils are equal, round, and reactive to light.  Neck: Neck supple.  Cardiovascular: Normal rate, regular rhythm, normal heart sounds and intact distal pulses.  Exam reveals no gallop and no friction rub.   No murmur heard. Pulmonary/Chest: Effort normal. No respiratory distress. He has no wheezes. He has no rales.  Abdominal: Soft. Bowel sounds are normal. There is no tenderness. There is no rebound and no guarding.  Genitourinary:       Brown stool, heme positive  Musculoskeletal: Normal range of motion. He exhibits no edema and no tenderness.  Neurological: He is alert and oriented to person, place, and time.  Skin: Skin is warm and dry.  Psychiatric: He has a normal mood and affect.    ED Course  Procedures (including critical care time)  Labs Reviewed  CBC - Abnormal; Notable for the following:    RBC 3.21 (*)    Hemoglobin 7.5 (*)    HCT 24.9 (*)    MCV 77.6 (*)    MCH 23.4 (*)    RDW 15.7 (*)    All other components within normal limits  COMPREHENSIVE METABOLIC PANEL - Abnormal; Notable for the following:    Sodium 134 (*)    Glucose, Bld 256 (*)    BUN 126 (*)    Creatinine, Ser 3.71 (*)    Total Protein 8.5 (*)    GFR calc non Af Amer 18 (*)    GFR calc Af Amer 21 (*)    All other components within normal limits  OCCULT BLOOD, POC DEVICE  OCCULT BLOOD X 1 CARD TO LAB, STOOL   No results found.   1. Anemia   2. Renal failure   3. GI bleed     MDM  Patient is asymptomatic. He has anemia with hemoglobin to 7.5 he has guaiac positive stools. Additionally the patient is also noted to have acute renal failure with a creatinine of 3.7. Discussed with outpatient clinic residents who will evaluate the patient for admission.  D/W [facility] residents. Admit. Attending Dr.  Barnie Mort, MD 01/01/11 [phone]

## 2010-12-31 NOTE — Telephone Encounter (Signed)
Called [phone] - denied Nexium. Pt needs to try 2 different meds for 30 days each of the following - lansoprazole,omeprazole, OTC Prilosec or pantoprazole [facility] and pt aware. Stanton Kidney Bettylou Frew RN 12/31/10 11:45AM

## 2010-12-31 NOTE — ED Notes (Signed)
Pt reports Dr. Madelyn Flavors rn called him today instructing pt to come to ED for further evaluation of abnormal blood work. Pt unsure of which test were done, sts "they said I'm loosing blood."

## 2010-12-31 NOTE — Telephone Encounter (Signed)
Prior Authorization for Nexium approved 12/31/2010 -12/31/2011.  [facility] was called to patient.  Angelina Ok, RN 12/31/2010 4:14 PM

## 2010-12-31 NOTE — ED Notes (Signed)
Pt sent from PCP for abnormal lab work. Pt states that his PCP told him his hemoglobin was low. Pt denies any blood in his stool, urine or coughing up blood. Pt alert and oriented and able to follow commands and move extremities.

## 2010-12-31 NOTE — ED Notes (Signed)
Pt sts, "they said my blood was 7.9."

## 2010-12-31 NOTE — H&P (Signed)
Hospital Admission Note Date: 12/31/2010  Patient name: Adam [name] Medical record number: [id number] Date of birth: 07/05/1965 Age: 45 y.o. Gender: male PCP: Bethel Born, MD  Medical Service: Internal Medicine Teaching Service  Attending physician:  Tilford Pillar    1st Contact: Lorretta Harp   Pager: [phone] 2nd Contact: Johnette Abraham  Pager: [phone] After 5 pm or weekends: 1st Contact:      Pager: [phone] 2nd Contact:      Pager: [phone]  Chief Complaint: Anemia  History of Present Illness: The patient is a 45 yo man, history of HTN, DM, CHF, and CKD, presenting with anemia.  The patient presented to his Nephrologist, Dr. Lowell Guitar, on the day prior to admission for lab work, where he was reportedly found to have a hemoglobin of 7.9.  The patient was called today by their office, and told to present to the hospital, which he did.  The patient notes no acute complaints, saying "I feel fine".  He notes no lightheadedness, dizziness, shortness of breath, nausea, vomiting, hematemesis, hemoptysis, abdominal pain, hematuria, or current hematochezia.  He does note that 2 weeks ago he experienced some straining with defecation, and subsequently had some red blood in his stool for a couple of days, which resolved.  He notes no similar previous episodes of bleeding.  The patient notes no chest pain, shortness of breath, cough, or LE edema.  Meds: No current facility-administered medications on file as of 12/31/2010.   Medications Prior to Admission  Medication Sig Dispense Refill  . esomeprazole (NEXIUM) 20 MG capsule Take 2 capsules (40 mg total) by mouth daily before breakfast.  60 capsule  11  . fluticasone (FLONASE) 50 MCG/ACT nasal spray Place 2 sprays into the nose daily.  16 g  2  . insulin glargine (LANTUS) 100 UNIT/ML injection Inject 55 Units into the skin at bedtime.        Marland Kitchen LORazepam (ATIVAN) 0.5 MG tablet Take 0.5 mg by mouth 2 (two) times daily as needed.        . metFORMIN  (GLUCOPHAGE) 1000 MG tablet Take 1 tablet (1,000 mg total) by mouth 2 (two) times daily with a meal.  62 tablet  11  . metoprolol (LOPRESSOR) 100 MG tablet Take 1 tablet (100 mg total) by mouth 2 (two) times daily.  62 tablet  11  . potassium chloride SA (KLOR-CON M20) 20 MEQ tablet Take 1 tablet (20 mEq total) by mouth daily.  60 tablet  3  . quinapril (ACCUPRIL) 40 MG tablet Take 1 tablet (40 mg total) by mouth daily.  30 tablet  11  . simvastatin (ZOCOR) 40 MG tablet Take 1 tablet (40 mg total) by mouth daily.  30 tablet  11  . spironolactone (ALDACTONE) 25 MG tablet Take 1 tablet (25 mg total) by mouth daily.  31 tablet  11  . tadalafil (CIALIS) 20 MG tablet Take 1 tablet (20 mg total) by mouth as needed.  10 tablet  5  . torsemide (DEMADEX) 100 MG tablet Take 1 tablet (100 mg total) by mouth 2 (two) times daily.  60 tablet  11  . DISCONTD: insulin glargine (LANTUS SOLOSTAR) 100 UNIT/ML injection Inject 55 Units subcutaneously once before bedtime  10 mL  11  . DISCONTD: LORazepam (ATIVAN) 0.5 MG tablet Take 1 tablet (0.5 mg total) by mouth 2 (two) times daily.  60 tablet  5  . DISCONTD: LORazepam (ATIVAN) 0.5 MG tablet Take 1 tablet (0.5 mg total) by mouth 2 (two) times daily  as needed (for nerves).  60 tablet  0    Allergies: Review of patient's allergies indicates no known allergies.  Past Medical History  Diagnosis Date  . Other and unspecified hyperlipidemia   . Insomnia, unspecified   . Obstructive sleep apnea (adult) (pediatric)   . Abdominal pain, left upper quadrant   . Allergic rhinitis, cause unspecified   . Other primary cardiomyopathies   . Unspecified essential hypertension   . Type II or unspecified type diabetes mellitus - Hb A1C = 6.9 (11/19/10)    Congestive Heart Failure - EF = 35%, ventricular dilation, last Echo on 09/24/09, followed by Dr. Tenny Craw   . Chronic kidney disease - followed by Dr. Lowell Guitar   . Esophageal reflux   . Morbid obesity       . Tobacco use  disorder    Surgical History -s/p AICD placement 08/2008 by Dr. Ladona Ridgel, for NICM with EF = 25% at that time, with history of non-sustained Vtach -Cardiac cath 03/28/04 showed severe nonischemic cardiomyopathy, likely due to HTN  Family History  Problem Relation Age of Onset  . Diabetes Mother   . Microcephaly Father   Lung cancer - Father No family history of colon cancer  History   Social History  . Marital Status: Single    Spouse Name: N/A    Number of Children: N/A  . Years of Education: N/A   Occupational History  . Not on file.   Social History Main Topics  . Smoking status: Current Everyday Smoker, 0.5 ppd x5-6 yrs    Types: Cigarettes  . Smokeless tobacco: Not on file   Comment: Will stop on his own  . Alcohol Use: 1-2 drinks/week  . Drug Use: No  . Sexually Active: Not on file   Other Topics Concern  . Not on file   Social History Narrative  . No narrative on file    Review of Systems: General: no fevers, chills, changes in weight, changes in appetite Skin: no rash HEENT: no blurry vision, hearing changes, sore throat Pulm: no dyspnea, coughing, wheezing CV: no chest pain, palpitations, shortness of breath Abd: no abdominal pain, nausea/vomiting, diarrhea GU: no dysuria, hematuria, polyuria Ext: no arthralgias, myalgias Neuro: no weakness, numbness, or tingling   Physical Exam: Blood pressure 107/66, pulse 87, temperature 98.7 F (37.1 C), temperature source Oral, resp. rate 16, SpO2 100.00%. General: alert, cooperative, and in no apparent distress HEENT: mild scleral icterus present, pupils equal round and reactive to light, vision grossly intact, oropharynx clear and non-erythematous  Neck: supple, no lymphadenopathy, no JVD Lungs: clear to ascultation bilaterally, normal work of respiration, no wheezes, rales, ronchi Heart: regular rate and rhythm, no murmurs, gallops, or rubs, pacemaker present Abdomen: soft, non-tender, non-distended, normal  bowel sounds   Rectal (per ED physician): Tywanda Rice stool, multiple internal hemorrhoids palpated, guiac positive Msk: no joint edema, warmth, or erythema Extremities: trace non-pitting edema, no cyanosis or clubbing Neurologic: alert & oriented X3, cranial nerves II-XII intact, strength grossly intact, sensation intact to light touch  Lab results: Basic Metabolic Panel:  Basename 12/31/10 1748  NA 134*  K 4.5  CL 96  CO2 21  GLUCOSE 256*  BUN 126*  CREATININE 3.71*  CALCIUM 9.7  MG --  PHOS --   Prior Creatinine Levels 1.78     05/23/10 1.7     06/07/09 2.0     05/28/09 1.4     03/20/09 1.04     01/01/09  Liver Function Tests:  Basename 12/31/10 1748  AST 15  ALT 8  ALKPHOS 89  BILITOT 1.0  PROT 8.5*  ALBUMIN 3.8   CBC:  Basename 12/31/10 1748  WBC 8.8  NEUTROABS --  HGB 7.5*  HCT 24.9*  MCV 77.6*  PLT 255   Prior Hb results: 10.0    05/23/10 10.3    05/28/09 13.5    09/01/08 13.2    12/30/06   FOBT: positive  Iron Studies 05/23/10 Ferritin: 26 TIBC: 484 % Saturation: 12 Iron: 56 Vit B: 340  Microalbumin/Creatinine ratio 01/30/10: 78  Imaging results:  Echocardiogram Results 09/24/09 Study Conclusions - Left ventricle: There is septal dyssynergy, but the seeptum does   thicken a little to the LV. The EF is in the 35% range. The cavity   size was moderately to severely dilated. Wall thickness was   increased in a pattern of mild LVH. Doppler parameters are   consistent with high ventricular filling pressure. - Mitral valve: There is flat closure of the mitral valve with mild   MR. - Left atrium: The atrium was moderately to severely dilated. - Right ventricle: The cavity size was moderately to severely   dilated. Pacer wire or catheter noted in right ventricle. Systolic   function was moderately to severely reduced. - Right atrium: The atrium was moderately dilated. - Tricuspid valve: Mild regurgitation.   Assessment & Plan by Problem: The patient is a  45 yo man, history of HTN, DM, CHF, and CKD, presenting with asymptomatic anemia, found to be FOBT positive, with prior evidence of iron deficiency anemia.  1. Anemia - The patient presents with a microcytic anemia, with Hemoglobin of 7.5, and FOBT positive rectal exam, subjectively asymptomatic, though with some hypotension.  Iron studies from 7 months ago show likely iron deficiency, and the patient has never been on iron supplementation.  The patient's current anemia is concerning for a GI source of bleeding, possibly diverticular vs hemorrhoids vs upper GI bleeding vs AVM vs colon polyps.  Hemolysis is unlikely given normal bilirubin, though the patient does have mild scleral icterus.  No history of trauma to support hematoma or other source of blood loss.  Colon cancer less likey, with no FH or weight loss. -check a.m. cbc to ensure stabilization of hemoglobin -will defer checking iron studies, since they were recently performed, and patient likely still has iron deficiency -consider consulting GI in a.m., pending am cbc result.  Will likely be able to pursue outpatient colonoscopy, rather than inpatient, if hb stable. -will start iron supplementation at discharge  2.  Acute kidney injury on Chronic renal insufficiency - patient has a history of CRI stage I, with Cr = 1.78 seven months ago.  Patient follows with Dr. Lowell Guitar, Nephrology, with his most recent visit last week, at which point labs were drawn.  The patient now presents with Cr = 3.71, concerning for acute kidney injury vs worsening CRI. -checking FeNa, urine microalbumin/creatinine ratio to evaluate renal function and proteinuria -to avoid unnecessarily repeating a work-up for this issue, we will contact [facility] tomorrow for their recent lab results  3. Hypotension - SBP's in 100's on admission, though observed to fall as low as SBP 80's in the ED during our examination.  Patient asymptomatic, with no dizziness, lightheadedness,  fatigue. -will hold lasix, spironolactone and quinapril -since asymptomatic, will avoid IVF in the setting of CHF with EF = 35% to avoid fluid overload -will continue metoprolol; may decreased dose or d/c later if pt  becomes symptomatic or continues to be hypotensive  4. Congestive Heart Failure - NICM with EF = 35%, previously as low as 25%, s/p ICD placement 08/2008 -continue metoprolol for CHF -hold other antihypertensives in the setting of acute hypotension and AKI  5. Diabetes - Hb A1C = 6.9, well-controlled with home lantus and metformin -moderate SSI -held lantus today due to decreased PO intake today, likely restart tomorrow   6, Prophy - SCD's  Signed: Janalyn Harder 12/31/2010, 9:59 PM

## 2010-12-31 NOTE — Telephone Encounter (Signed)
send to PCP

## 2011-01-01 ENCOUNTER — Observation Stay (HOSPITAL_COMMUNITY): Payer: Medicaid Other

## 2011-01-01 LAB — CBC
HCT: 25 % — ABNORMAL LOW (ref 39.0–52.0)
MCHC: 30.3 g/dL (ref 30.0–36.0)
Platelets: 239 10*3/uL (ref 150–400)
Platelets: 269 10*3/uL (ref 150–400)
RBC: 3.23 MIL/uL — ABNORMAL LOW (ref 4.22–5.81)
RDW: 15.6 % — ABNORMAL HIGH (ref 11.5–15.5)
RDW: 15.5 % (ref 11.5–15.5)
WBC: 8.4 10*3/uL (ref 4.0–10.5)

## 2011-01-01 LAB — COMPREHENSIVE METABOLIC PANEL
ALT: 7 U/L (ref 0–53)
Alkaline Phosphatase: 83 U/L (ref 39–117)
CO2: 22 mEq/L (ref 19–32)
GFR calc Af Amer: 23 mL/min — ABNORMAL LOW (ref >90)
GFR calc non Af Amer: 20 mL/min — ABNORMAL LOW (ref >90)
Glucose, Bld: 155 mg/dL — ABNORMAL HIGH (ref 70–99)
Potassium: 3.9 mEq/L (ref 3.5–5.1)
Sodium: 135 mEq/L (ref 135–145)

## 2011-01-01 LAB — IRON AND TIBC
Iron: 14 ug/dL — ABNORMAL LOW (ref 42–135)
TIBC: 560 ug/dL — ABNORMAL HIGH (ref 215–435)
UIBC: 546 ug/dL — ABNORMAL HIGH (ref 125–400)

## 2011-01-01 LAB — GLUCOSE, CAPILLARY
Glucose-Capillary: 234 mg/dL — ABNORMAL HIGH (ref 70–99)
Glucose-Capillary: 213 mg/dL — ABNORMAL HIGH (ref 70–99)

## 2011-01-01 LAB — URINALYSIS, ROUTINE W REFLEX MICROSCOPIC
Nitrite: NEGATIVE
Specific Gravity, Urine: 1.011 (ref 1.005–1.030)
Urobilinogen, UA: 2 mg/dL — ABNORMAL HIGH (ref 0.0–1.0)

## 2011-01-01 LAB — PROTEIN, URINE, RANDOM: Total Protein, Urine: 4.6 mg/dL

## 2011-01-01 LAB — FERRITIN: Ferritin: 16 ng/mL — ABNORMAL LOW (ref 22–322)

## 2011-01-01 LAB — CREATININE, URINE, RANDOM
Creatinine, Urine: 55.59 mg/dL
Creatinine, Urine: 82.17 mg/dL

## 2011-01-01 LAB — SODIUM, URINE, RANDOM: Sodium, Ur: 85 mEq/L

## 2011-01-01 MED ORDER — SODIUM CHLORIDE 0.9 % IV SOLN
INTRAVENOUS | Status: AC
  Administered 2011-01-01: 17:00:00 via INTRAVENOUS

## 2011-01-01 MED ORDER — INSULIN ASPART 100 UNIT/ML ~~LOC~~ SOLN
0.0000 [IU] | Freq: Once | SUBCUTANEOUS | Status: AC
  Administered 2011-01-01: 8 [IU] via SUBCUTANEOUS

## 2011-01-01 MED ORDER — METOPROLOL TARTRATE 100 MG PO TABS
100.0000 mg | ORAL_TABLET | Freq: Two times a day (BID) | ORAL | Status: DC
  Administered 2011-01-01 – 2011-01-04 (×6): 100 mg via ORAL
  Filled 2011-01-01 (×7): qty 1

## 2011-01-01 NOTE — H&P (Signed)
Internal Medicine Teaching Service Attending Note Date: 01/01/2011  Patient name: Adam [name]  Medical record number: [id number]  Date of birth: 1965-12-13   I have seen and evaluated Adam [name] and discussed their care with the Residency Team.  Adam [name] is unclear as to why he is here despite [place] explaining it to him a couple of times. I did spend a good deal of time with him today. He is afraid of the "procedure" of a colonoscopy and wants to make sure he does not need a surgery.   Physical Exam: Blood pressure 114/71, pulse 84, temperature 98 F (36.7 C), temperature source Oral, resp. rate 20, height 6\' 1"  (1.854 m), weight 344 lb 9.3 oz (156.3 kg), SpO2 93.00%. Patient nervous about procedures, o/w in NAD, Lungs-clear Heart-RRR, no extra sounds heard Abdom-+bs, nt Extrem-no edema Neuro-non-focal   Lab results: Results for orders placed during the hospital encounter of 12/31/10 (from the past 24 hour(s))  CBC     Status: Abnormal   Collection Time   12/31/10  5:48 PM      Component Value Range   WBC 8.8  4.0 - 10.5 (K/uL)   RBC 3.21 (*) 4.22 - 5.81 (MIL/uL)   Hemoglobin 7.5 (*) 13.0 - 17.0 (g/dL)   HCT 08.6 (*) 57.8 - 52.0 (%)   MCV 77.6 (*) 78.0 - 100.0 (fL)   MCH 23.4 (*) 26.0 - 34.0 (pg)   MCHC 30.1  30.0 - 36.0 (g/dL)   RDW 46.9 (*) 62.9 - 15.5 (%)   Platelets 255  150 - 400 (K/uL)  COMPREHENSIVE METABOLIC PANEL     Status: Abnormal   Collection Time   12/31/10  5:48 PM      Component Value Range   Sodium 134 (*) 135 - 145 (mEq/L)   Potassium 4.5  3.5 - 5.1 (mEq/L)   Chloride 96  96 - 112 (mEq/L)   CO2 21  19 - 32 (mEq/L)   Glucose, Bld 256 (*) 70 - 99 (mg/dL)   BUN 528 (*) 6 - 23 (mg/dL)   Creatinine, Ser 4.13 (*) 0.50 - 1.35 (mg/dL)   Calcium 9.7  8.4 - 24.4 (mg/dL)   Total Protein 8.5 (*) 6.0 - 8.3 (g/dL)   Albumin 3.8  3.5 - 5.2 (g/dL)   AST 15  0 - 37 (U/L)   ALT 8  0 - 53 (U/L)   Alkaline Phosphatase 89  39 - 117 (U/L)   Total Bilirubin 1.0  0.3 - 1.2  (mg/dL)   GFR calc non Af Amer 18 (*) >90 (mL/min)   GFR calc Af Amer 21 (*) >90 (mL/min)  OCCULT BLOOD, POC DEVICE     Status: Normal   Collection Time   12/31/10  7:38 PM      Component Value Range   Fecal Occult Bld POSITIVE    GLUCOSE, CAPILLARY     Status: Abnormal   Collection Time   12/31/10 10:43 PM      Component Value Range   Glucose-Capillary 263 (*) 70 - 99 (mg/dL)  CREATININE, URINE, RANDOM     Status: Normal   Collection Time   01/01/11 12:46 AM      Component Value Range   Creatinine, Urine 55.59    SODIUM, URINE, RANDOM     Status: Normal   Collection Time   01/01/11 12:46 AM      Component Value Range   Sodium, Ur 85    MICROALBUMIN / CREATININE URINE RATIO  Status: Normal   Collection Time   01/01/11 12:46 AM      Component Value Range   Microalb, Ur 1.34  0.00 - 1.89 (mg/dL)   Creatinine, Urine 96.0     Microalb Creat Ratio 21.8  0.0 - 30.0 (mg/g)  PROTEIN, URINE, RANDOM     Status: Normal   Collection Time   01/01/11 12:46 AM      Component Value Range   Total Protein, Urine 4.6    COMPREHENSIVE METABOLIC PANEL     Status: Abnormal   Collection Time   01/01/11  5:30 AM      Component Value Range   Sodium 135  135 - 145 (mEq/L)   Potassium 3.9  3.5 - 5.1 (mEq/L)   Chloride 98  96 - 112 (mEq/L)   CO2 22  19 - 32 (mEq/L)   Glucose, Bld 155 (*) 70 - 99 (mg/dL)   BUN 454 (*) 6 - 23 (mg/dL)   Creatinine, Ser 0.98 (*) 0.50 - 1.35 (mg/dL)   Calcium 9.5  8.4 - 11.9 (mg/dL)   Total Protein 8.0  6.0 - 8.3 (g/dL)   Albumin 3.5  3.5 - 5.2 (g/dL)   AST 13  0 - 37 (U/L)   ALT 7  0 - 53 (U/L)   Alkaline Phosphatase 83  39 - 117 (U/L)   Total Bilirubin 1.0  0.3 - 1.2 (mg/dL)   GFR calc non Af Amer 20 (*) >90 (mL/min)   GFR calc Af Amer 23 (*) >90 (mL/min)  CBC     Status: Abnormal   Collection Time   01/01/11  5:30 AM      Component Value Range   WBC 8.7  4.0 - 10.5 (K/uL)   RBC 3.01 (*) 4.22 - 5.81 (MIL/uL)   Hemoglobin 7.0 (*) 13.0 - 17.0 (g/dL)   HCT  14.7 (*) 82.9 - 52.0 (%)   MCV 76.7 (*) 78.0 - 100.0 (fL)   MCH 23.3 (*) 26.0 - 34.0 (pg)   MCHC 30.3  30.0 - 36.0 (g/dL)   RDW 56.2 (*) 13.0 - 15.5 (%)   Platelets 239  150 - 400 (K/uL)  IRON AND TIBC     Status: Abnormal   Collection Time   01/01/11  5:30 AM      Component Value Range   Iron 14 (*) 42 - 135 (ug/dL)   TIBC 865 (*) 784 - 696 (ug/dL)   Saturation Ratios 3 (*) 20 - 55 (%)   UIBC 546 (*) 125 - 400 (ug/dL)  RETICULOCYTES     Status: Abnormal   Collection Time   01/01/11  5:30 AM      Component Value Range   Retic Ct Pct 2.2  0.4 - 3.1 (%)   RBC. 3.01 (*) 4.22 - 5.81 (MIL/uL)   Retic Count, Manual 66.2  19.0 - 186.0 (K/uL)  GLUCOSE, CAPILLARY     Status: Abnormal   Collection Time   01/01/11  8:05 AM      Component Value Range   Glucose-Capillary 137 (*) 70 - 99 (mg/dL)   Comment 1 Documented in Chart     Comment 2 Notify RN    TYPE AND SCREEN     Status: Normal (Preliminary result)   Collection Time   01/01/11  9:20 AM      Component Value Range   ABO/RH(D) A POS     Antibody Screen NEG     Sample Expiration 01/04/2011     Unit Number  [id number]     Blood Component Type RED CELLS,LR     Unit division 00     Status of Unit ALLOCATED     Transfusion Status OK TO TRANSFUSE     Crossmatch Result Compatible     Unit Number [id number]     Blood Component Type RED CELLS,LR     Unit division 00     Status of Unit ALLOCATED     Transfusion Status OK TO TRANSFUSE     Crossmatch Result Compatible    ABO/RH     Status: Normal   Collection Time   01/01/11  9:20 AM      Component Value Range   ABO/RH(D) A POS    GLUCOSE, CAPILLARY     Status: Abnormal   Collection Time   01/01/11 11:56 AM      Component Value Range   Glucose-Capillary 234 (*) 70 - 99 (mg/dL)   Comment 1 Documented in Chart     Comment 2 Notify RN      Imaging results:  No results found.  Assessment and Plan: I agree with the formulated Assessment and Plan with the following changes:  It is  unclear how fast Adam [name] Hb has fallen; as per presentation his last labs and visit with Dr. Lowell Guitar were in 4/12, though patient says he has seen him more recently. It is concerning however that the Hb dropped from 7.9 to 7.0 overnight, though now I am seeing a reading of 7.5 so it may be more stable than we think. However he was also FOBT+ in the ED. Will appreciate GI assistance on the best place to work this up. And will follow Hb. He has not received any IVF in the hospital so his HB should not be changing from that.

## 2011-01-01 NOTE — Progress Notes (Signed)
Subjective: Patient feel normal, no any complaints.  Denies fever, chills, fatigue, headaches,  cough, chest pain, SOB,  abdominal pain,diarrhea, dysuria, urgency, frequency, hematuria, joint pain or leg swelling.   Objective: Vital signs in last 24 hours: Filed Vitals:   12/31/10 2233 12/31/10 2234 12/31/10 2235 01/01/11 0530  BP: 120/76 135/82 139/99 109/60  Pulse: 92 97 90 86  Temp:   97.9 F (36.6 C) 98.3 F (36.8 C)  TempSrc:   Oral Oral  Resp:   18 20  Height:      Weight:      SpO2:   99% 93%   Weight change:   Intake/Output Summary (Last 24 hours) at 01/01/11 0934 Last data filed at 01/01/11 0540  Gross per 24 hour  Intake    240 ml  Output    500 ml  Net   -260 ml   Physical Exam:  General: resting in bed, not in acute distress HEENT: PERRL, EOMI, no scleral icterus Cardiac: S1/S2, RRR, No murmurs, gallops or rubs Pulm: Good air movement bilaterally, Clear to auscultation bilaterally, No rales, wheezing, rhonchi or rubs. Abd: Soft,  nondistended, nontender, no rebound pain, no organomegaly, BS present Ext: No rashes or edema, 2+DP/PT pulse bilaterally Neuro: alert and oriented X3, cranial nerves II-XII grossly intact, muscle strength 5/5 in all extremeties,  sensation to light touch intact.    Lab Results: Basic Metabolic Panel:  Lab 01/01/11 1610 12/31/10 1748  NA 135 134*  K 3.9 4.5  CL 98 96  CO2 22 21  GLUCOSE 155* 256*  BUN 122* 126*  CREATININE 3.49* 3.71*  CALCIUM 9.5 9.7  MG -- --  PHOS -- --   Liver Function Tests:  Lab 01/01/11 0530 12/31/10 1748  AST 13 15  ALT 7 8  ALKPHOS 83 89  BILITOT 1.0 1.0  PROT 8.0 8.5*  ALBUMIN 3.5 3.8     Lab 01/01/11 0530 12/31/10 1748  WBC 8.7 8.8  NEUTROABS -- --  HGB 7.0* 7.5*  HCT 23.1* 24.9*  MCV 76.7* 77.6*  PLT 239 255      Lab 01/01/11 0805 12/31/10 2243  GLUCAP 137* 263*   Urine Drug Screen: Drugs of Abuse     Component Value Date/Time   LABOPIA NEG 03/17/2006 1455   COCAINSCRNUR NEG 03/17/2006 1455   LABBENZ NEG 03/17/2006 1455   AMPHETMU NEG 03/17/2006 1455     Scheduled Meds:   . docusate sodium  100 mg Oral BID  . insulin aspart  0-15 Units Subcutaneous TID WC & HS  . insulin aspart  0-15 Units Subcutaneous Once  . metoprolol  100 mg Oral BID  . pantoprazole (PROTONIX) IV  40 mg Intravenous QHS  . simvastatin  40 mg Oral Daily  . sodium chloride  3 mL Intravenous Q12H   Continuous Infusions:  PRN Meds:.sodium chloride, acetaminophen, acetaminophen, LORazepam, ondansetron (ZOFRAN) IV, ondansetron   Assessment/Plan:  The patient is a 45 yo man, history of HTN, DM, CHF (EF 35% in 2011), and CKD (baseline cre 1.78 7 month ago), chronic anemia (baseline Hb around 10), presenting with asymptomatic anemia, found to be FOBT positive, with prior evidence of iron deficiency anemia.  1. Anemia - Hb 7.0 this morning. Baseline Hb is aound 10. Subjectively asymptomatic. Patient had some red blood in his stool for a couple of days. FOBT positive. It is mostly likely due to bleeding.   -will get GI consult -will type and screen -will repeat CBC q4h -will transfuse patient  if Hb drop further or becomes symptomatic -will start iron supplementation at discharge  2.  Acute kidney injury on Chronic renal insufficiency - patient has a history of CRI stage I, with Cr = 1.78 seven months ago.  Patient follows with Dr. Lowell Guitar, Nephrology, with his most recent visit last week, at which point labs were drawn.  The patient now presents with Cr = 3.71, concerning for acute kidney injury vs worsening CRI. Patient's FeNa is 0.04,   -will get renal consult -will give IV fluid gently 30 cc/hour for 10 hours given his CHF  4. Congestive Heart Failure - NICM with EF = 35%, previously as low as 25%, s/p ICD placement 08/2008 -continue metoprolol for CHF, hold metoprolol if SBP is lower than 100. -hold other antihypertensives in the setting of acute hypotension and AKI  5.  Diabetes - Hb A1C = 6.9, well-controlled with home lantus and metformin -moderate SSI -held lantus today due to decreased PO intake today  6, Prophy - SCD's   LOS: 1 day   Lorretta Harp 01/01/2011, 9:34 AM

## 2011-01-01 NOTE — Consult Note (Signed)
Referring Provider: No ref. provider found Primary Care Physician:  Bethel Born, MD Primary Nephrologist:  Dr. Lowell Guitar  Reason for Consultation:  Acute on Chronic renal disease  HPI:The patient is a 45 yo man, history of HTN, DM, CHF, and CKD, presenting with anemia. The patient presented to his Nephrologist, Dr. Lowell Guitar, on the day prior to admission for lab work, where he was reportedly found to have a hemoglobin of 7.9. The patient was called today by their office, and told to present to the hospital, which he did. The patient notes no acute complaints, saying "I feel fine". He notes no lightheadedness, dizziness, shortness of breath, nausea, vomiting, hematemesis, hemoptysis, abdominal pain, hematuria, or current hematochezia.  Patient denies the use of NSAIDs, was using both metformin and quinapril (ACE). His renal function showed a creatinine of 3.49 on Nov 28th with a base line creatinine of 1.7. At that time his hemoglobin had dropped to 7.9.  Past Medical History  Diagnosis Date  . Other and unspecified hyperlipidemia   . Insomnia, unspecified   . Obstructive sleep apnea (adult) (pediatric)   . Abdominal pain, left upper quadrant   . Allergic rhinitis, cause unspecified   . Other primary cardiomyopathies   . Unspecified essential hypertension   . Type II or unspecified type diabetes mellitus without mention of complication, not stated as uncontrolled   . Type II or unspecified type diabetes mellitus without mention of complication, not stated as uncontrolled   . Chronic kidney disease, stage I   . Esophageal reflux   . Morbid obesity   . Dysuria   . Tobacco use disorder   . Dysrhythmia     Past Surgical History  Procedure Date  . Cardiac defibrillator placement 2011    Prior to Admission medications   Medication Sig Start Date End Date Taking? Authorizing Provider  esomeprazole (NEXIUM) 20 MG capsule Take 2 capsules (40 mg total) by mouth daily before breakfast. 12/30/10   Yes Blanch Media  fluticasone [facility]) 50 MCG/ACT nasal spray Place 2 sprays into the nose daily. 11/19/10  Yes Deatra Robinson  insulin glargine (LANTUS) 100 UNIT/ML injection Inject 55 Units into the skin at bedtime.   11/19/10  Yes Nodira Karimova  LORazepam (ATIVAN) 0.5 MG tablet Take 0.5 mg by mouth 2 (two) times daily as needed.   07/25/10 11/19/11 Yes Madhav Devani  metFORMIN (GLUCOPHAGE) 1000 MG tablet Take 1 tablet (1,000 mg total) by mouth 2 (two) times daily with a meal. 11/19/10  Yes Nodira Karimova  metoprolol (LOPRESSOR) 100 MG tablet Take 1 tablet (100 mg total) by mouth 2 (two) times daily. 11/19/10  Yes Nodira Karimova  potassium chloride SA (KLOR-CON M20) 20 MEQ tablet Take 1 tablet (20 mEq total) by mouth daily. 11/19/10  Yes Nodira Karimova  quinapril (ACCUPRIL) 40 MG tablet Take 1 tablet (40 mg total) by mouth daily. 11/19/10  Yes Deatra Robinson  simvastatin (ZOCOR) 40 MG tablet Take 1 tablet (40 mg total) by mouth daily. 11/19/10  Yes Deatra Robinson  spironolactone (ALDACTONE) 25 MG tablet Take 1 tablet (25 mg total) by mouth daily. 11/19/10  Yes Deatra Robinson  tadalafil (CIALIS) 20 MG tablet Take 1 tablet (20 mg total) by mouth as needed. 11/19/10  Yes Deatra Robinson  torsemide (DEMADEX) 100 MG tablet Take 1 tablet (100 mg total) by mouth 2 (two) times daily. 11/19/10  Yes Deatra Robinson    Current Facility-Administered Medications  Medication Dose Route Frequency Provider Last Rate Last Dose  . 0.9 %  sodium chloride infusion  250 mL Intravenous PRN Megha Sawhney      . 0.9 %  sodium chloride infusion   Intravenous Continuous Lorretta Harp, MD      . acetaminophen (TYLENOL) tablet 650 mg  650 mg Oral Q6H PRN Megha Sawhney       Or  . acetaminophen (TYLENOL) suppository 650 mg  650 mg Rectal Q6H PRN Megha Sawhney      . docusate sodium (COLACE) capsule 100 mg  100 mg Oral BID Megha Sawhney   100 mg at 01/01/11 0931  . insulin aspart (novoLOG) injection 0-15 Units   0-15 Units Subcutaneous TID WC & HS Janalyn Harder, MD   2 Units at 01/01/11 0831  . insulin aspart (novoLOG) injection 0-15 Units  0-15 Units Subcutaneous Once Gary Fleet [place], PHARMD   8 Units at 01/01/11 0111  . LORazepam (ATIVAN) tablet 0.5 mg  0.5 mg Oral BID PRN Megha Sawhney   0.5 mg at 01/01/11 0036  . metoprolol (LOPRESSOR) tablet 100 mg  100 mg Oral BID Lorretta Harp, MD      . ondansetron [facility]) tablet 4 mg  4 mg Oral Q6H PRN Megha Sawhney       Or  . ondansetron (ZOFRAN) injection 4 mg  4 mg Intravenous Q6H PRN Megha Sawhney      . pantoprazole (PROTONIX) injection 40 mg  40 mg Intravenous QHS Megha Sawhney   40 mg at 01/01/11 0036  . simvastatin (ZOCOR) tablet 40 mg  40 mg Oral Daily Megha Sawhney   40 mg at 01/01/11 0931  . sodium chloride 0.9 % injection 3 mL  3 mL Intravenous Q12H Megha Sawhney   3 mL at 01/01/11 1000  . DISCONTD: metoprolol (LOPRESSOR) tablet 100 mg  100 mg Oral BID Megha Sawhney   100 mg at 01/01/11 0931    Allergies as of 12/31/2010  . (No Known Allergies)    Family History  Problem Relation Age of Onset  . Diabetes Mother   . Microcephaly Father     History   Social History  . Marital Status: Single    Spouse Name: N/A    Number of Children: N/A  . Years of Education: N/A   Occupational History  . Not on file.   Social History Main Topics  . Smoking status: Current Everyday Smoker -- 0.5 packs/day    Types: Cigarettes  . Smokeless tobacco: Never Used   Comment: Will stop on his own  . Alcohol Use: 0.0 oz/week    0 Cans of beer per week  . Drug Use: No  . Sexually Active: Yes   Other Topics Concern  . Not on file   Social History Narrative  . No narrative on file    Review of Systems: Gen: Denies any fever, chills, sweats, anorexia, fatigue, weakness, malaise, weight loss, and sleep disorder HEENT: No visual complaints, No history of Retinopathy. Normal external appearance No Epistaxis or Sore throat. No sinusitis.   CV: Denies  chest pain, angina, palpitations, syncope, orthopnea, PND, peripheral edema, and claudication. Resp: Denies dyspnea at rest, dyspnea with exercise, cough, sputum, wheezing, coughing up blood, and pleurisy. GI: Denies vomiting blood, jaundice, and fecal incontinence.   Denies dysphagia or odynophagia. GU : Denies urinary burning, blood in urine, urinary frequency, urinary hesitancy, nocturnal urination, and urinary incontinence.  No renal calculi. MS: Denies joint pain, limitation of movement, and swelling, stiffness, low back pain, extremity pain. Denies muscle weakness, cramps, atrophy.  No use  of non steroidal antiinflammatory drugs. Derm: Denies rash, itching, dry skin, hives, moles, warts, or unhealing ulcers.  Psych: Denies depression, anxiety, memory loss, suicidal ideation, hallucinations, paranoia, and confusion. Heme: Denies bruising, bleeding, and enlarged lymph nodes. Neuro: No headache.  No diplopia. No dysarthria.  No dysphasia.  No history of CVA.  No Seizures. No paresthesias.  No weakness. Endocrine No DM.  No Thyroid disease.  No Adrenal disease.  Physical Exam: Vital signs in last 24 hours: Temp:  [97.9 F (36.6 C)-98.7 F (37.1 C)] 98 F (36.7 C) (12/05 0900) Pulse Rate:  [83-97] 84  (12/05 0900) Resp:  [16-20] 20  (12/05 0900) BP: (102-139)/(56-99) 114/71 mmHg (12/05 0900) SpO2:  [93 %-100 %] 93 % (12/05 0530) Weight:  [156.3 kg (344 lb 9.3 oz)] 344 lb 9.3 oz (156.3 kg) (12/04 2232) Last BM Date: 12/31/10 General:   Alert,  Well-developed, well-nourished, pleasant and cooperative in NAD Head:  Normocephalic and atraumatic. Eyes:  Sclera clear, no icterus.   Conjunctiva pink. Ears:  Normal auditory acuity. Nose:  No deformity, discharge,  or lesions. Mouth:  No deformity or lesions, dentition normal. Neck:  Supple; no masses or thyromegaly. JVP not elevated Lungs:  Clear throughout to auscultation.   No wheezes, crackles, or rhonchi. No acute distress. Heart:   Regular rate and rhythm; no murmurs, clicks, rubs,  or gallops. Abdomen:  Soft, nontender and nondistended. Blood positive in stool   Msk:  Symmetrical without gross deformities. Normal posture. Pulses:  No carotid, renal, femoral bruits. DP and PT symmetrical and equal Extremities:  Without clubbing or edema. Neurologic:  Alert and  oriented x4;  grossly normal neurologically. Skin:  Intact without significant lesions or rashes. Cervical Nodes:  No significant cervical adenopathy. Psych:  Alert and cooperative. Normal mood and affect.  Intake/Output from previous day: 12/04 0701 - 12/05 0700 In: 240 [P.O.:240] Out: 500 [Urine:500] Intake/Output this shift:    Lab Results:  Basename 01/01/11 0530 12/31/10 1748  WBC 8.7 8.8  HGB 7.0* 7.5*  HCT 23.1* 24.9*  PLT 239 255   BMET  Basename 01/01/11 0530 12/31/10 1748  NA 135 134*  K 3.9 4.5  CL 98 96  CO2 22 21  GLUCOSE 155* 256*  BUN 122* 126*  CREATININE 3.49* 3.71*  CALCIUM 9.5 9.7  PHOS -- --   LFT  Basename 01/01/11 0530  PROT 8.0  ALBUMIN 3.5  AST 13  ALT 7  ALKPHOS 83  BILITOT 1.0  BILIDIR --  IBILI --   PT/INR No results found for this basename: LABPROT:2,INR:2 in the last 72 hours Hepatitis Panel No results found for this basename: HEPBSAG,HCVAB,HEPAIGM,HEPBIGM in the last 72 hours  Studies/Results: No results found.  Assessment/Plan: 1) Acute on Chronic renal failure with anemia and blood loss. Patient was taking ACE inhibitor and metformin. These have been discontinued on admission. He is taking no diuretics. Check urinalysis and renal ultrasound. Will follow labs and anticipate return of renal function. 2) Anemia probably secondary to GI blood loss 3) Dm controlled with insulin 4) History of Cardiomyopathy and AICD   LOS: 1 Jenan Ellegood W @TODAY @12 :16 PM

## 2011-01-01 NOTE — Consult Note (Signed)
[facility] Consultation Note  Physician Requesting Consult:  Tilford Pillar, MD Primary Care Physician:  Bethel Born, MD Primary Gastroenterologist:  None  Reason for Consultation:  Anemia, hemoccult-positive stool  HPI: Adam [name] is a 45 y.o. male with multiple medical problems as described below, including chronic kidney disease (followed by Dr. Lowell Guitar) as well as congestive heart failure with history of AICD placement couple years ago.  Was found on routine bloodwork to have Hgb 7.9, as well as acute on chronic renal insufficiency (Cr ~ 1.7 --> 3.7) and was directed for admission.  Stools were reportedly brown but hemoccult-positive.  No weakness, dizziness, lightheadedness, syncope.  He has no abdominal pain, nausea, vomiting, dysphagia, odynophagia, loss-of-appetite, unintentional weight loss, change in bowel habits, hematemesis, melena, hematochezia.  Hgb ~ 6 months ago was 10.  Recent iron studies showed low iron and low % iron saturation (ferritin?).  He has never had an endoscopy or colonoscopy.  No recent NSAIDs.  There is no known family history of colon cancer or colon polyps.  Past Medical History  Diagnosis Date  . Other and unspecified hyperlipidemia   . Insomnia, unspecified   . Obstructive sleep apnea (adult) (pediatric)   . Abdominal pain, left upper quadrant   . Allergic rhinitis, cause unspecified   . Other primary cardiomyopathies   . Unspecified essential hypertension   . Type II or unspecified type diabetes mellitus without mention of complication, not stated as uncontrolled   . Type II or unspecified type diabetes mellitus without mention of complication, not stated as uncontrolled   . Chronic kidney disease, stage I   . Esophageal reflux   . Morbid obesity   . Dysuria   . Tobacco use disorder   . Dysrhythmia     Past Surgical History  Procedure Date  . Cardiac defibrillator placement 2011    Prior to Admission medications   Medication Sig  Start Date End Date Taking? Authorizing Provider  esomeprazole (NEXIUM) 20 MG capsule Take 2 capsules (40 mg total) by mouth daily before breakfast. 12/30/10  Yes Blanch Media  fluticasone [facility]) 50 MCG/ACT nasal spray Place 2 sprays into the nose daily. 11/19/10  Yes Deatra Robinson  insulin glargine (LANTUS) 100 UNIT/ML injection Inject 55 Units into the skin at bedtime.   11/19/10  Yes Nodira Karimova  LORazepam (ATIVAN) 0.5 MG tablet Take 0.5 mg by mouth 2 (two) times daily as needed.   07/25/10 11/19/11 Yes Madhav Devani  metFORMIN (GLUCOPHAGE) 1000 MG tablet Take 1 tablet (1,000 mg total) by mouth 2 (two) times daily with a meal. 11/19/10  Yes Nodira Karimova  metoprolol (LOPRESSOR) 100 MG tablet Take 1 tablet (100 mg total) by mouth 2 (two) times daily. 11/19/10  Yes Nodira Karimova  potassium chloride SA (KLOR-CON M20) 20 MEQ tablet Take 1 tablet (20 mEq total) by mouth daily. 11/19/10  Yes Nodira Karimova  quinapril (ACCUPRIL) 40 MG tablet Take 1 tablet (40 mg total) by mouth daily. 11/19/10  Yes Deatra Robinson  simvastatin (ZOCOR) 40 MG tablet Take 1 tablet (40 mg total) by mouth daily. 11/19/10  Yes Deatra Robinson  spironolactone (ALDACTONE) 25 MG tablet Take 1 tablet (25 mg total) by mouth daily. 11/19/10  Yes Deatra Robinson  tadalafil (CIALIS) 20 MG tablet Take 1 tablet (20 mg total) by mouth as needed. 11/19/10  Yes Deatra Robinson  torsemide (DEMADEX) 100 MG tablet Take 1 tablet (100 mg total) by mouth 2 (two) times daily. 11/19/10  Yes Deatra Robinson    Current  Facility-Administered Medications  Medication Dose Route Frequency Provider Last Rate Last Dose  . 0.9 %  sodium chloride infusion  250 mL Intravenous PRN Megha Sawhney      . 0.9 %  sodium chloride infusion   Intravenous Continuous Lorretta Harp, MD      . acetaminophen (TYLENOL) tablet 650 mg  650 mg Oral Q6H PRN Megha Sawhney       Or  . acetaminophen (TYLENOL) suppository 650 mg  650 mg Rectal Q6H PRN Megha  Sawhney      . docusate sodium (COLACE) capsule 100 mg  100 mg Oral BID Megha Sawhney   100 mg at 01/01/11 0931  . insulin aspart (novoLOG) injection 0-15 Units  0-15 Units Subcutaneous TID WC & HS Janalyn Harder, MD   5 Units at 01/01/11 1252  . insulin aspart (novoLOG) injection 0-15 Units  0-15 Units Subcutaneous Once Gary Fleet [place], PHARMD   8 Units at 01/01/11 0111  . LORazepam (ATIVAN) tablet 0.5 mg  0.5 mg Oral BID PRN Megha Sawhney   0.5 mg at 01/01/11 0036  . metoprolol (LOPRESSOR) tablet 100 mg  100 mg Oral BID Lorretta Harp, MD      . ondansetron [facility]) tablet 4 mg  4 mg Oral Q6H PRN Megha Sawhney       Or  . ondansetron (ZOFRAN) injection 4 mg  4 mg Intravenous Q6H PRN Megha Sawhney      . pantoprazole (PROTONIX) injection 40 mg  40 mg Intravenous QHS Megha Sawhney   40 mg at 01/01/11 0036  . simvastatin (ZOCOR) tablet 40 mg  40 mg Oral Daily Megha Sawhney   40 mg at 01/01/11 0931  . sodium chloride 0.9 % injection 3 mL  3 mL Intravenous Q12H Megha Sawhney   3 mL at 01/01/11 1000  . DISCONTD: metoprolol (LOPRESSOR) tablet 100 mg  100 mg Oral BID Megha Sawhney   100 mg at 01/01/11 0931    Allergies as of 12/31/2010  . (No Known Allergies)    Family History  Problem Relation Age of Onset  . Diabetes Mother   . Microcephaly Father     History   Social History  . Marital Status: Single    Spouse Name: N/A    Number of Children: N/A  . Years of Education: N/A   Occupational History  . Not on file.   Social History Main Topics  . Smoking status: Current Everyday Smoker -- 0.5 packs/day    Types: Cigarettes  . Smokeless tobacco: Never Used   Comment: Will stop on his own  . Alcohol Use: 0.0 oz/week    0 Cans of beer per week  . Drug Use: No  . Sexually Active: Yes   Other Topics Concern  . Not on file   Social History Narrative  . No narrative on file    Review of Systems:  Gen: Denies any fever, chills, rigors, night sweats, anorexia, fatigue, weakness,  malaise, involuntary weight loss, and sleep disorder CV: Denies chest pain, angina, palpitations, syncope, orthopnea, PND, peripheral edema, and claudication. Resp: Denies dyspnea, cough, sputum, wheezing, coughing up blood. GI: Described in detail in HPI.    GU : Denies urinary burning, blood in urine, urinary frequency, urinary hesitancy, nocturnal urination, and urinary incontinence. MS: Denies joint pain or swelling.  Denies muscle weakness, cramps, atrophy.  Derm: Denies rash, itching, oral ulcerations, hives, unhealing ulcers.  Psych: Denies depression, anxiety, memory loss, suicidal ideation, hallucinations,  and confusion. Heme: Denies bruising,  bleeding, and enlarged lymph nodes. Neuro:  Denies any headaches, dizziness, paresthesias. Endo:  Denies any problems with DM, thyroid, adrenal function.  Physical Exam: Vital signs in last 24 hours: Temp:  [97.9 F (36.6 C)-98.7 F (37.1 C)] 98 F (36.7 C) (12/05 0900) Pulse Rate:  [83-97] 84  (12/05 0900) Resp:  [16-20] 20  (12/05 0900) BP: (102-139)/(56-99) 114/71 mmHg (12/05 0900) SpO2:  [93 %-100 %] 93 % (12/05 0530) Weight:  [156.3 kg (344 lb 9.3 oz)] 344 lb 9.3 oz (156.3 kg) (12/04 2232) Last BM Date: 12/31/10 General:   Alert,  Well-developed, overweight, pleasant and cooperative in NAD Head:  Normocephalic and atraumatic. Eyes:  Muddy sclera, no icterus.   Conjunctiva somewhat pale. Ears:  Normal auditory acuity. Nose:  No deformity, discharge,  or lesions. Mouth:  No deformity or lesions.  Oropharynx pink & moist. Neck:  Supple; no masses or thyromegaly. Lungs:  Clear throughout to auscultation.   No wheezes, crackles, or rhonchi. No acute distress. Heart:  Distant heart sounds, but seemingly regular rate and rhythm; no murmurs, clicks, rubs,  or gallops. Left anterior chest wall AICD palpable Abdomen:  Soft, protuberant, nontender and nondistended. No masses, hepatosplenomegaly or hernias noted. Normal bowel sounds, without  guarding, and without rebound.     Msk:  Symmetrical without gross deformities. Normal posture. Pulses:  Normal pulses noted. Extremities:  Without clubbing or edema. Neurologic:  Alert and  oriented x4;  grossly normal neurologically. Skin:  Intact without significant lesions or rashes. Psych:  Alert and cooperative. Normal mood and affect.   Lab Results:  [facility] 01/01/11 0530 12/31/10 1748  WBC 8.7 8.8  HGB 7.0* 7.5*  HCT 23.1* 24.9*  PLT 239 255   BMET  Basename 01/01/11 0530 12/31/10 1748  NA 135 134*  K 3.9 4.5  CL 98 96  CO2 22 21  GLUCOSE 155* 256*  BUN 122* 126*  CREATININE 3.49* 3.71*  CALCIUM 9.5 9.7   LFT  Basename 01/01/11 0530  PROT 8.0  ALBUMIN 3.5  AST 13  ALT 7  ALKPHOS 83  BILITOT 1.0  BILIDIR --  IBILI --   PT/INR No results found for this basename: LABPROT:2,INR:2 in the last 72 hours  Studies/Results: No results found.  Impression:  1.  Anemia with hemoccult-positive stools.  No overt bleeding.  Hemodynamically-stable.  Suspect renal disease is playing role.  Certainly GI losses (albeit slow, given lack of overt bleeding) can be playing role as well.  He has no GI symptoms otherwise at this point. 2.  Acute on chronic renal insufficiency.  Plan:  1.  Agree with supportive management.  May benefit from blood transfusion if Hgb continues to drop; will defer this (versus IV iron, EPO, etc.) to primary and nephrology management teams. 2.  PPI. 3.  Serial CBCs. 4.  Avoid/Minimize NSAIDs as clinically feasible. 5.  I have advised inpatient endoscopy + colonoscopy.  Patient is not sure he wants to do these as inpatient.  He will think it over.   6.  We will revisit patient tomorrow morning to see if patient has had change of heart re: pursuing inpatient endoscopic procedures.     LOS: 1 day   Kerry-Anne Mezo M  01/01/2011, 2:00 PM

## 2011-01-01 NOTE — Progress Notes (Signed)
Case Management Note: 01/01/11 0954--Utilization review complete.  Tera Mater, RN, BSN Case Manager # [phone]

## 2011-01-01 NOTE — Progress Notes (Signed)
Inpatient Diabetes Program Recommendations  AACE/ADA: New Consensus Statement on Inpatient Glycemic Control (2009)  Target Ranges:  Prepandial:   less than 140 mg/dL      Peak postprandial:   less than 180 mg/dL (1-2 hours)      Critically ill patients:  140 - 180 mg/dL   Reason for Visit: Hyperglycemia  Inpatient Diabetes Program Recommendations Insulin - Basal: Pt takes Lantus 55 units at HS at home.  Please order at least 15-20 units to start while here. Correction (SSI): Moderate correction ordered tid with meals and at HS.  Do you want the HS scale to be used at HS? Oral Agents: Pt takes Metformin at home.  This may be unsafe and contraindicated in light of the CRF status.  Note: Thank you, Lenor Coffin, RN, CNS, Diabetes Coordinator [phone])

## 2011-01-02 DIAGNOSIS — E119 Type 2 diabetes mellitus without complications: Secondary | ICD-10-CM

## 2011-01-02 DIAGNOSIS — D649 Anemia, unspecified: Secondary | ICD-10-CM

## 2011-01-02 LAB — IRON AND TIBC: UIBC: >575 ug/dL — ABNORMAL HIGH (ref 125–400)

## 2011-01-02 LAB — RENAL FUNCTION PANEL
CO2: 21 mEq/L (ref 19–32)
Chloride: 96 mEq/L (ref 96–112)
Creatinine, Ser: 3.14 mg/dL — ABNORMAL HIGH (ref 0.50–1.35)
GFR calc Af Amer: 26 mL/min — ABNORMAL LOW (ref >90)
GFR calc non Af Amer: 22 mL/min — ABNORMAL LOW (ref >90)

## 2011-01-02 LAB — CBC
Hemoglobin: 7.3 g/dL — ABNORMAL LOW (ref 13.0–17.0)
MCH: 23.4 pg — ABNORMAL LOW (ref 26.0–34.0)
MCHC: 30.7 g/dL (ref 30.0–36.0)
MCHC: 30.3 g/dL (ref 30.0–36.0)
Platelets: 260 10*3/uL (ref 150–400)
Platelets: 249 10*3/uL (ref 150–400)
RBC: 3.11 MIL/uL — ABNORMAL LOW (ref 4.22–5.81)
RDW: 15.4 % (ref 11.5–15.5)
RDW: 15.3 % (ref 11.5–15.5)
WBC: 8.5 10*3/uL (ref 4.0–10.5)

## 2011-01-02 MED ORDER — DARBEPOETIN ALFA-POLYSORBATE 150 MCG/0.3ML IJ SOLN
150.0000 ug | INTRAMUSCULAR | Status: DC
  Administered 2011-01-02: 150 ug via SUBCUTANEOUS
  Filled 2011-01-02: qty 0.3

## 2011-01-02 MED ORDER — SODIUM CHLORIDE 0.9 % IV SOLN
INTRAVENOUS | Status: DC
  Administered 2011-01-02: 10:00:00 via INTRAVENOUS

## 2011-01-02 MED ORDER — FERROUS SULFATE 325 (65 FE) MG PO TABS
325.0000 mg | ORAL_TABLET | Freq: Three times a day (TID) | ORAL | Status: DC
  Administered 2011-01-02 – 2011-01-04 (×6): 325 mg via ORAL
  Filled 2011-01-02 (×9): qty 1

## 2011-01-02 MED ORDER — PANTOPRAZOLE SODIUM 40 MG PO TBEC
40.0000 mg | DELAYED_RELEASE_TABLET | Freq: Every day | ORAL | Status: DC
  Administered 2011-01-02 – 2011-01-03 (×2): 40 mg via ORAL
  Filled 2011-01-02 (×2): qty 1

## 2011-01-02 MED ORDER — INSULIN GLARGINE 100 UNIT/ML ~~LOC~~ SOLN
20.0000 [IU] | Freq: Every day | SUBCUTANEOUS | Status: DC
  Administered 2011-01-02 – 2011-01-03 (×2): 20 [IU] via SUBCUTANEOUS
  Filled 2011-01-02 (×2): qty 3

## 2011-01-02 MED ORDER — DARBEPOETIN ALFA-POLYSORBATE 150 MCG/0.3ML IJ SOLN
150.0000 ug | INTRAMUSCULAR | Status: DC
  Filled 2011-01-02 (×2): qty 0.3

## 2011-01-02 NOTE — Progress Notes (Signed)
Subjective: GI Pt is willing to undergo EGD but not colonoscopy at this time.  Long discussion with him but he is just not going to do colonoscopy now.  Issues explained to him.  Objective: Vital signs in last 24 hours: Temp:  [97.9 F (36.6 C)-98.1 F (36.7 C)] 98 F (36.7 C) (12/06 0950) Pulse Rate:  [78-95] 88  (12/06 0950) Resp:  [18-20] 18  (12/06 0950) BP: (101-134)/(66-82) 117/73 mmHg (12/06 0950) SpO2:  [93 %-97 %] 93 % (12/06 0950) Weight:  [156.3 kg (344 lb 9.3 oz)] 344 lb 9.3 oz (156.3 kg) (12/05 2100) Last BM Date: 01/01/11  Intake/Output from previous day: 12/05 0701 - 12/06 0700 In: 723 [P.O.:720; I.V.:3] Out: 1175 [Urine:1175] Intake/Output this shift:    PE:no changes  Lab Results:  Basename 01/02/11 0815 01/02/11 0300 01/01/11 1755 01/01/11 0530 12/31/10 1748  WBC 8.6 8.5 8.4 8.7 8.8  HGB 7.4* 7.0* 7.6* 7.0* 7.5*  HCT 24.1* 23.2* 25.0* 23.1* 24.9*  PLT 249 260 269 239 255   BMET  Basename 01/02/11 0815 01/01/11 0530 12/31/10 1748  NA 133* 135 134*  K 4.5 3.9 4.5  CL 96 98 96  CO2 21 22 21   CREATININE 3.14* 3.49* 3.71*   LFT  Basename 01/01/11 0530 12/31/10 1748  PROT 8.0 8.5*  AST 13 15  ALT 7 8  ALKPHOS 83 89  BILITOT 1.0 1.0  BILIDIR -- --  IBILI -- --   PT/INR No results found for this basename: LABPROT:3,INR:3 in the last 72 hours Hepatitis Panel No results found for this basename: HEPBSAG,HCVAB,HEPAIGM,HEPBIGM in the last 72 hours     Studies/Results: US Renal  01/01/2011  *RADIOLOGY REPORT*  Clinical Data: Acute renal failure.  RENAL/URINARY TRACT ULTRASOUND COMPLETE  Comparison:  06/20/2009.  Findings:  Right Kidney:  14.7 cm. No hydronephrosis or renal mass.  Left Kidney:  15.0 cm.  Evaluation slightly limited by habitus/bowel gas.  No gross abnormality noted.  Bladder:  No focal mass identified.  IMPRESSION: No hydronephrosis.  Evaluation of the left kidney slightly limited by the patient's habitus/bowel gas.  Original Report  Authenticated By: Fuller Canada, M.D.    Medications: I have reviewed the patient's current medications.  Assessment/Plan: 1. GI bleed. Clearly slow.  Feel he should have EGD and colon but he will only consent to EGD at this time scheduled for 11 am tomorrow.  He said he may do colon as op later.   Nadalee Neiswender JR,Analuisa Tudor L 01/02/2011, 11:54 AM

## 2011-01-02 NOTE — Progress Notes (Signed)
Subjective: Interval History: none.  Objective: Vital signs in last 24 hours:  Temp:  [97.9 F (36.6 C)-98.1 F (36.7 C)] 97.9 F (36.6 C) (12/06 0522) Pulse Rate:  [78-95] 84  (12/06 0522) Resp:  [20] 20  (12/06 0522) BP: (101-134)/(66-82) 106/66 mmHg (12/06 0522) SpO2:  [93 %-97 %] 93 % (12/06 0522) Weight:  [156.3 kg (344 lb 9.3 oz)] 344 lb 9.3 oz (156.3 kg) (12/05 2100)  Weight change: 0 kg (0 lb)  Intake/Output: I/O last 3 completed shifts: In: 964 [P.O.:961; I.V.:3] Out: 1675 [Urine:1675]   Intake/Output this shift:     CVS- RRR RS- CTA ABD- BS present soft non-distended EXT- no edema  Lab Results:  Basename 01/02/11 0815 01/02/11 0300 01/01/11 1755  WBC 8.6 8.5 8.4  HGB 7.4* 7.0* 7.6*  HCT 24.1* 23.2* 25.0*  PLT 249 260 269   BMET  Basename 01/01/11 0530 12/31/10 1748  NA 135 134*  K 3.9 4.5  CL 98 96  CO2 22 21  GLUCOSE 155* 256*  BUN 122* 126*  CREATININE 3.49* 3.71*  CALCIUM 9.5 9.7  PHOS -- --   LFT  Basename 01/01/11 0530  PROT 8.0  ALBUMIN 3.5  AST 13  ALT 7  ALKPHOS 83  BILITOT 1.0  BILIDIR --  IBILI --   PT/INR No results found for this basename: LABPROT:2,INR:2 in the last 72 hours Hepatitis Panel No results found for this basename: HEPBSAG,HCVAB,HEPAIGM,HEPBIGM in the last 72 hours  Studies/Results: US Renal  01/01/2011  *RADIOLOGY REPORT*  Clinical Data: Acute renal failure.  RENAL/URINARY TRACT ULTRASOUND COMPLETE  Comparison:  06/20/2009.  Findings:  Right Kidney:  14.7 cm. No hydronephrosis or renal mass.  Left Kidney:  15.0 cm.  Evaluation slightly limited by habitus/bowel gas.  No gross abnormality noted.  Bladder:  No focal mass identified.  IMPRESSION: No hydronephrosis.  Evaluation of the left kidney slightly limited by the patient's habitus/bowel gas.  Original Report Authenticated By: Fuller Canada, M.D.    I have reviewed the patient's current medications.  Assessment/Plan: 1) Acute on Chronic renal failure  with anemia and blood loss. Patient was taking ACE inhibitor and metformin. These have been discontinued on admission. He is taking no diuretics. Check urinalysis and renal ultrasound. Will follow labs and anticipate return of renal function.  2) Anemia probably secondary to GI blood loss  3) Dm controlled with insulin  4) History of Cardiomyopathy and AICD   Kidney function improving in hospital . Anemic will check iron studies and will start ESA   LOS: 2 Adam [name] W @TODAY @9 :07 AM

## 2011-01-02 NOTE — Progress Notes (Signed)
Pt HGB was 7.0 after most recent draw.  This was a decrease from the previous level of 7.6.  MD notified and expressed that we would not proceed with transfusing at the moment but continue to monitor the patient.

## 2011-01-02 NOTE — Progress Notes (Signed)
Internal Medicine Teaching Service Attending Note Date: 01/02/2011  Patient name: Baker Moronta  Medical record number: [id number]  Date of birth: [birth year]    This patient has been seen and discussed with the house staff. Please see their note for complete details. I concur with their findings with the following additions/corrections:  Mr. Oestreich has decide he wants to pursue EGD and not colonoscopy ? In house. Will let him discuss that further with GI. Hb has been stable. He is very afraid of the procedures, despite [place] discussing them with him several times. Cr not checked yet today, but had trended down a little yesterday.  WOODYEAR,WYNNE E 01/02/2011, 11:34 AM

## 2011-01-02 NOTE — Progress Notes (Signed)
Subjective:  Patient feels scared, but still be asymoptomatic. I explained what is going on and what is our plan to him in detail. He feels better. Objective: Vital signs in last 24 hours: Filed Vitals:   01/01/11 1400 01/01/11 2100 01/01/11 2127 01/02/11 0522  BP: 101/68 134/82 134/82 106/66  Pulse: 78 95  84  Temp: 98 F (36.7 C) 98.1 F (36.7 C)  97.9 F (36.6 C)  TempSrc: Oral Oral  Oral  Resp: 20 20  20   Height:      Weight:  344 lb 9.3 oz (156.3 kg)    SpO2: 97% 95%  93%   Weight change: 0 lb (0 kg)  Intake/Output Summary (Last 24 hours) at 01/02/11 0813 Last data filed at 01/01/11 2128  Gross per 24 hour  Intake    483 ml  Output   1175 ml  Net   -692 ml   Physical Exam: General: resting in bed, not in acute distress HEENT: PERRL, EOMI, no scleral icterus Cardiac: S1/S2, RRR, No murmurs, gallops or rubs Pulm: Good air movement bilaterally, Clear to auscultation bilaterally, No rales, wheezing, rhonchi or rubs. Abd: Soft,  nondistended, nontender, no rebound pain, no organomegaly, BS present Ext: No rashes or edema, 2+DP/PT pulse bilaterally Neuro: alert and oriented X3, cranial nerves II-XII grossly intact, muscle strength 5/5 in all extremeties,  sensation to light touch intact.   Lab Results: Basic Metabolic Panel:  Lab 01/01/11 4098 12/31/10 1748  NA 135 134*  K 3.9 4.5  CL 98 96  CO2 22 21  GLUCOSE 155* 256*  BUN 122* 126*  CREATININE 3.49* 3.71*  CALCIUM 9.5 9.7  MG -- --  PHOS -- --   Liver Function Tests:  Lab 01/01/11 0530 12/31/10 1748  AST 13 15  ALT 7 8  ALKPHOS 83 89  BILITOT 1.0 1.0  PROT 8.0 8.5*  ALBUMIN 3.5 3.8    Lab 01/02/11 0300 01/01/11 1755  WBC 8.5 8.4  NEUTROABS -- --  HGB 7.0* 7.6*  HCT 23.2* 25.0*  MCV 77.3* 77.4*  PLT 260 269     Lab 01/01/11 1728 01/01/11 1156 01/01/11 0805 12/31/10 2243  GLUCAP 213* 234* 137* 263*     Lab 01/01/11 0530  VITAMINB12 503  FOLATE 14.4  FERRITIN 16*  TIBC 560*  IRON 14*    RETICCTPCT 2.2   Urine Drug Screen: Drugs of Abuse     Component Value Date/Time   LABOPIA NEG 03/17/2006 1455   COCAINSCRNUR NEG 03/17/2006 1455   LABBENZ NEG 03/17/2006 1455   AMPHETMU NEG 03/17/2006 1455     Studies/Results: US Renal  01/01/2011  *RADIOLOGY REPORT*  Clinical Data: Acute renal failure.  RENAL/URINARY TRACT ULTRASOUND COMPLETE  Comparison:  06/20/2009.  Findings:  Right Kidney:  14.7 cm. No hydronephrosis or renal mass.  Left Kidney:  15.0 cm.  Evaluation slightly limited by habitus/bowel gas.  No gross abnormality noted.  Bladder:  No focal mass identified.  IMPRESSION: No hydronephrosis.  Evaluation of the left kidney slightly limited by the patient's habitus/bowel gas.  Original Report Authenticated By: Fuller Canada, M.D.   Medications:  Scheduled Meds:   . docusate sodium  100 mg Oral BID  . insulin aspart  0-15 Units Subcutaneous TID WC & HS  . insulin glargine  20 Units Subcutaneous QHS  . metoprolol  100 mg Oral BID  . pantoprazole (PROTONIX) IV  40 mg Intravenous QHS  . simvastatin  40 mg Oral Daily  . sodium  chloride  3 mL Intravenous Q12H  . DISCONTD: metoprolol  100 mg Oral BID   Continuous Infusions:   . sodium chloride 30 mL/hr at 01/01/11 1700   PRN Meds:.sodium chloride, acetaminophen, acetaminophen, LORazepam, ondansetron (ZOFRAN) IV, ondansetron Assessment/Plan: The patient is a 45 yo man, history of HTN, DM, CHF (EF 35% in 2011), and CKD (baseline cre 1.78 7 month ago), chronic anemia (baseline Hb around 10), presenting with asymptomatic anemia, found to be FOBT positive, with prior evidence of iron deficiency anemia.  1. Anemia - Hb stablized at 7.0 this morning. Baseline Hb is aound 10. Subjectively asymptomatic. Patient had some red blood in his stool for a couple of days. FOBT positive. It is mostly likely due to bleeding.    -GI consulted. Dr. Willis Modena saw patient. Suspect renal disease is playing role. Certainly GI losses (albeit  slow, given lack of overt bleeding) can be playing role as well.  He advised inpatient endoscopy + colonoscopy. Patient is not sure he wants to do these as inpatient. Dr. Dulce Sellar will talk to patient again today. I also explained to patient. Basically, patient prefers to do EGD in the hospital and thinks of doing colonoscopy late. --will repeat CBC q12h. will transfuse patient if Hb drop further or becomes symptomatic -will start iron supplementation today  2.  Acute kidney injury on Chronic renal insufficiency - patient has a history of CRI stage I, with Cr = 1.78 seven months ago.  Patient follows with Dr. Lowell Guitar, Nephrology, with his most recent visit last week, at which point labs were drawn.  The patient now presents with Cr = 3.71, concerning for acute kidney injury vs worsening CRI. Patient's FeNa is 0.04,    - renal consulted. Dr. Elvis Coil saw patient, thinks acute on Chronic renal failure with anemia and blood loss. Suggested to check urinalysis and renal ultrasound. Anticipate return of renal function. Patient Cre trending down yesterday from 3.71 to 3.49.   -will give IV fluid gently 35 cc/hour for 10 hours given his CHF  4. Congestive Heart Failure - NICM with EF = 35%, previously as low as 25%, s/p ICD placement 08/2008 -continue metoprolol for CHF, hold metoprolol if SBP is lower than 100. -hold other antihypertensives in the setting of acute hypotension and AKI  5. Diabetes - Hb A1C = 6.9, CBG 150 to 260. -moderate SSI -add 20 HS  lantus today   6, Prophy - SCD's            LOS: 2 days   Lorretta Harp 01/02/2011, 8:13 AM

## 2011-01-03 ENCOUNTER — Encounter (HOSPITAL_COMMUNITY): Payer: Self-pay | Admitting: *Deleted

## 2011-01-03 ENCOUNTER — Other Ambulatory Visit: Payer: Self-pay | Admitting: Gastroenterology

## 2011-01-03 ENCOUNTER — Encounter (HOSPITAL_COMMUNITY)
Admission: EM | Disposition: A | Discharge status: Home / Self Care | Payer: Self-pay | Source: Home / Self Care | Attending: Internal Medicine

## 2011-01-03 HISTORY — PX: ESOPHAGOGASTRODUODENOSCOPY: SHX5428

## 2011-01-03 LAB — CBC
HCT: 23.9 % — ABNORMAL LOW (ref 39.0–52.0)
MCH: 23.7 pg — ABNORMAL LOW (ref 26.0–34.0)
MCHC: 30.5 g/dL (ref 30.0–36.0)
MCHC: 30.5 g/dL (ref 30.0–36.0)
MCV: 76.6 fL — ABNORMAL LOW (ref 78.0–100.0)
MCV: 76.6 fL — ABNORMAL LOW (ref 78.0–100.0)
Platelets: 224 10*3/uL (ref 150–400)
Platelets: 236 10*3/uL (ref 150–400)
RBC: 3.04 MIL/uL — ABNORMAL LOW (ref 4.22–5.81)
RDW: 15.5 % (ref 11.5–15.5)
RDW: 15.5 % (ref 11.5–15.5)
WBC: 7.8 10*3/uL (ref 4.0–10.5)

## 2011-01-03 LAB — RENAL FUNCTION PANEL
Albumin: 3.8 g/dL (ref 3.5–5.2)
BUN: 104 mg/dL — ABNORMAL HIGH (ref 6–23)
Calcium: 9.9 mg/dL (ref 8.4–10.5)
Glucose, Bld: 191 mg/dL — ABNORMAL HIGH (ref 70–99)
Phosphorus: 4.1 mg/dL (ref 2.3–4.6)
Sodium: 135 mEq/L (ref 135–145)

## 2011-01-03 LAB — GLUCOSE, CAPILLARY

## 2011-01-03 SURGERY — EGD (ESOPHAGOGASTRODUODENOSCOPY)
Anesthesia: Moderate Sedation

## 2011-01-03 MED ORDER — MIDAZOLAM HCL 10 MG/2ML IJ SOLN
INTRAMUSCULAR | Status: AC
  Filled 2011-01-03: qty 2

## 2011-01-03 MED ORDER — FENTANYL NICU IV SYRINGE 50 MCG/ML
INJECTION | INTRAMUSCULAR | Status: DC | PRN
  Administered 2011-01-03 (×4): 25 ug via INTRAVENOUS

## 2011-01-03 MED ORDER — FENTANYL CITRATE 0.05 MG/ML IJ SOLN
INTRAMUSCULAR | Status: AC
  Filled 2011-01-03: qty 2

## 2011-01-03 MED ORDER — SODIUM CHLORIDE 0.9 % IV SOLN
INTRAVENOUS | Status: DC
  Administered 2011-01-03: 500 mL via INTRAVENOUS

## 2011-01-03 MED ORDER — MIDAZOLAM HCL 10 MG/2ML IJ SOLN
INTRAMUSCULAR | Status: DC | PRN
  Administered 2011-01-03: 2 mg via INTRAVENOUS
  Administered 2011-01-03: 1 mg via INTRAVENOUS
  Administered 2011-01-03 (×2): 2 mg via INTRAVENOUS

## 2011-01-03 MED ORDER — BUTAMBEN-TETRACAINE-BENZOCAINE 2-2-14 % EX AERO
INHALATION_SPRAY | CUTANEOUS | Status: DC | PRN
  Administered 2011-01-03: 2 via TOPICAL

## 2011-01-03 NOTE — Op Note (Signed)
[facility] [address] [place], [place]  [address]  ENDOSCOPY PROCEDURE REPORT  PATIENT:  Doc, Mandala  MR#:  [id number] BIRTHDATE:  04-19-1965, 45 yrs. old  GENDER:  male  ENDOSCOPIST:  Carman Ching Referred by:  Internal Medicine Teaching Service, M.D.  PROCEDURE DATE:  01/03/2011 PROCEDURE:  EGD with biopsy, 43239 ASA CLASS:  Class IV INDICATIONS:  hemorrhage of GI tract  MEDICATIONS:   Fentanyl 100 mcg IV, Versed 7 mg IV TOPICAL ANESTHETIC:  Cetacaine Spray  DESCRIPTION OF PROCEDURE:   After the risks and benefits of the procedure were explained, informed consent was obtained.  The Pentax Gastroscope [id number] endoscope was introduced through the mouth and advanced to the second portion of the duodenum.  The instrument was slowly withdrawn as the mucosa was fully examined. <<PROCEDUREIMAGES>>  An ulcer was found in the antrum. shallow about 2 cm diameter not bleeding With standard forceps, a biopsy was obtained and sent to pathology. around edges    Retroflexed views revealed no abnormalities.    The scope was then withdrawn from the patient and the procedure completed.  COMPLICATIONS:  A complication of none occurred on 01/03/2011 at.  ENDOSCOPIC IMPRESSION: 1) Ulcer in the antrum not actively bleeding but will need to followed until healing RECOMMENDATIONS: continue PPI, transfuse if Hg falls any lower. follow-up in office in 6 weeks to arrange repeat EGD  ______________________________ Carman Ching  CC:  n. eSIGNED:   Carman Ching at 01/03/2011 12:01 PM  [place], [phone]

## 2011-01-03 NOTE — Progress Notes (Signed)
Subjective:  No event overnight, He feels ready to do the EGD on 11:00AM. Had one brown-colored bowel movement without blood it.   Objective: Vital signs in last 24 hours: Filed Vitals:   01/02/11 1426 01/02/11 1722 01/02/11 2139 01/03/11 0547  BP: 108/70 112/73 100/60 105/68  Pulse: 80 103 89 90  Temp: 98.1 F (36.7 C) 99.5 F (37.5 C) 98 F (36.7 C) 98.3 F (36.8 C)  TempSrc: Oral Oral Oral Oral  Resp: 22 20 19 20   Height:      Weight:   344 lb 2.2 oz (156.1 kg)   SpO2: 96% 95% 92% 93%   Weight change: -7.1 oz (-0.2 kg)  Intake/Output Summary (Last 24 hours) at 01/03/11 1004 Last data filed at 01/03/11 0100  Gross per 24 hour  Intake    720 ml  Output    300 ml  Net    420 ml   Physical Exam:  General: resting in bed, not in acute distress  HEENT: PERRL, EOMI, no scleral icterus  Cardiac: S1/S2, RRR, No murmurs, gallops or rubs  Pulm: Good air movement bilaterally, Clear to auscultation bilaterally, No rales, wheezing, rhonchi or rubs.  Abd: Soft, nondistended, nontender, no rebound pain, no organomegaly, BS present  Ext: No rashes or edema, 2+DP/PT pulse bilaterally  Neuro: alert and oriented X3, cranial nerves II-XII grossly intact, muscle strength 5/5 in all extremeties, sensation to light touch intact.   Lab Results: Basic Metabolic Panel:  Lab 01/02/11 6578 01/01/11 0530  NA 133* 135  K 4.5 3.9  CL 96 98  CO2 21 22  GLUCOSE 212* 155*  BUN 114* 122*  CREATININE 3.14* 3.49*  CALCIUM 10.0 9.5  MG -- --  PHOS 4.8* --   Liver Function Tests:  Lab 01/02/11 0815 01/01/11 0530 12/31/10 1748  AST -- 13 15  ALT -- 7 8  ALKPHOS -- 83 89  BILITOT -- 1.0 1.0  PROT -- 8.0 8.5*  ALBUMIN 3.8 3.5 --    CBC:  Lab 01/03/11 0022 01/02/11 1634  WBC 7.8 8.6  NEUTROABS -- --  HGB 7.1* 7.3*  HCT 23.3* 24.1*  MCV 76.6* 77.5*  PLT 224 246    Lab 01/01/11 1728 01/01/11 1156 01/01/11 0805 12/31/10 2243  GLUCAP 213* 234* 137* 263*   Anemia Panel:  Lab  01/02/11 0815 01/01/11 0530  VITAMINB12 -- 503  FOLATE -- 14.4  FERRITIN 14* --  TIBC NOT CALC --  IRON 27* --  RETICCTPCT -- 2.2   Urine Drug Screen: Drugs of Abuse     Component Value Date/Time   LABOPIA NEG 03/17/2006 1455   COCAINSCRNUR NEG 03/17/2006 1455   LABBENZ NEG 03/17/2006 1455   AMPHETMU NEG 03/17/2006 1455     US Renal  01/01/2011  *RADIOLOGY REPORT*  Clinical Data: Acute renal failure.  RENAL/URINARY TRACT ULTRASOUND COMPLETE  Comparison:  06/20/2009.  Findings:  Right Kidney:  14.7 cm. No hydronephrosis or renal mass.  Left Kidney:  15.0 cm.  Evaluation slightly limited by habitus/bowel gas.  No gross abnormality noted.  Bladder:  No focal mass identified.  IMPRESSION: No hydronephrosis.  Evaluation of the left kidney slightly limited by the patient's habitus/bowel gas.  Original Report Authenticated By: Fuller Canada, M.D.   Medications:  Scheduled Meds:   . darbepoetin (ARANESP) injection - NON-DIALYSIS  150 mcg Subcutaneous Q Thu-1800  . docusate sodium  100 mg Oral BID  . ferrous sulfate  325 mg Oral TID WC  . insulin  aspart  0-15 Units Subcutaneous TID WC & HS  . insulin glargine  20 Units Subcutaneous QHS  . metoprolol  100 mg Oral BID  . pantoprazole  40 mg Oral Q1200  . simvastatin  40 mg Oral Daily  . sodium chloride  3 mL Intravenous Q12H  . DISCONTD: darbepoetin (ARANESP) injection - DIALYSIS  150 mcg Intravenous Q Thu-HD  . DISCONTD: pantoprazole (PROTONIX) IV  40 mg Intravenous QHS   Continuous Infusions:   . DISCONTD: sodium chloride 35 mL/hr at 01/02/11 0957   PRN Meds:.sodium chloride, acetaminophen, acetaminophen, LORazepam, ondansetron (ZOFRAN) IV, ondansetron  Assessment/Plan: The patient is a 45 yo man, history of HTN, DM, CHF (EF 35% in 2011), and CKD (baseline cre 1.78 7 month ago), chronic anemia (baseline Hb around 10), presenting with asymptomatic anemia, found to be FOBT positive, with prior evidence of iron deficiency anemia.   1.  Anemia - Hb stablized at 7.0 this morning. Baseline Hb is aound 10 at April, 2012.  Subjectively asymptomatic. Patient had some red blood in his stool for a couple of days. FOBT positive. It is most likely due to the combination of chronic renal disease and GI bleeding.  Plan:  -GI consulted. Dr. Willis Modena saw patient. Will do EGD at 11:00 AM.  In fact, Dr. Dulce Sellar advised patient for both endoscopy + colonoscopy. Patient only agrees to do EGD at this moment. Will follow up the results. If EGD negative, will discharge home and set up appointment with GI for colonoscope in the near future. --will repeat CBC q12h. will transfuse patient if Hb drop further or becomes symptomatic  -continue  iron supplementation   2. Acute kidney injury on Chronic renal insufficiency - patient has a history of CRI stage I, with Cr = 1.78 seven months ago. Patient follows with Dr. Lowell Guitar, Nephrology, with his most recent visit last week, at which point labs were drawn. Renal consulted,  Dr. Elvis Coil saw patient, thinks acute on Chronic renal failure with anemia and blood loss. Suggested to check urinalysis and renal ultrasound. Which came negative. Patient Cre trending down  from 3.71 to 3. 2.14  -will monitor by checking BMP  4. Congestive Heart Failure - NICM with EF = 35%, previously as low as 25%, s/p ICD placement 08/2008  -continue metoprolol for CHF, hold metoprolol if SBP is lower than 100.  -hold other antihypertensives in the setting AKI   5. Diabetes - Hb A1C = 6.9.   -will continue moderate SSI + 20 HS lantus  6, Prophy - SCD's    LOS: 3 days   Lorretta Harp 01/03/2011, 10:04 AM

## 2011-01-03 NOTE — Progress Notes (Signed)
Subjective: Interval History: none.  Objective: Vital signs in last 24 hours:  Temp:  [98 F (36.7 C)-99.5 F (37.5 C)] 98.3 F (36.8 C) (12/07 0547) Pulse Rate:  [80-103] 90  (12/07 0547) Resp:  [18-22] 20  (12/07 0547) BP: (100-117)/(60-73) 105/68 mmHg (12/07 0547) SpO2:  [92 %-96 %] 93 % (12/07 0547) Weight:  [156.1 kg (344 lb 2.2 oz)] 344 lb 2.2 oz (156.1 kg) (12/06 2139)  Weight change: -0.2 kg (-7.1 oz)  Intake/Output: I/O last 3 completed shifts: In: 963 [P.O.:960; I.V.:3] Out: 300 [Urine:300]   Intake/Output this shift:     CVS- RRR RS- CTA ABD- BS present soft non-distended EXT- no edema  Lab Results:  Basename 01/03/11 0022 01/02/11 1634 01/02/11 0815  WBC 7.8 8.6 8.6  HGB 7.1* 7.3* 7.4*  HCT 23.3* 24.1* 24.1*  PLT 224 246 249   BMET  Basename 01/02/11 0815 01/01/11 0530 12/31/10 1748  NA 133* 135 134*  K 4.5 3.9 4.5  CL 96 98 96  CO2 21 22 21   GLUCOSE 212* 155* 256*  BUN 114* 122* 126*  CREATININE 3.14* 3.49* 3.71*  CALCIUM 10.0 9.5 9.7  PHOS 4.8* -- --   LFT  Basename 01/02/11 0815 01/01/11 0530  PROT -- 8.0  ALBUMIN 3.8 --  AST -- 13  ALT -- 7  ALKPHOS -- 83  BILITOT -- 1.0  BILIDIR -- --  IBILI -- --   PT/INR No results found for this basename: LABPROT:2,INR:2 in the last 72 hours Hepatitis Panel No results found for this basename: HEPBSAG,HCVAB,HEPAIGM,HEPBIGM in the last 72 hours  Studies/Results: US Renal  01/01/2011  *RADIOLOGY REPORT*  Clinical Data: Acute renal failure.  RENAL/URINARY TRACT ULTRASOUND COMPLETE  Comparison:  06/20/2009.  Findings:  Right Kidney:  14.7 cm. No hydronephrosis or renal mass.  Left Kidney:  15.0 cm.  Evaluation slightly limited by habitus/bowel gas.  No gross abnormality noted.  Bladder:  No focal mass identified.  IMPRESSION: No hydronephrosis.  Evaluation of the left kidney slightly limited by the patient's habitus/bowel gas.  Original Report Authenticated By: Fuller Canada, M.D.    I have  reviewed the patient's current medications.  Assessment/Plan: ) Acute on Chronic renal failure with anemia and blood loss. Patient was taking ACE inhibitor and metformin. These have been discontinued on admission. He is taking no diuretics. Check urinalysis and renal ultrasound. Will follow labs and anticipate return of renal function.  2) Anemia probably secondary to GI blood loss  3) Dm controlled with insulin  4) History of Cardiomyopathy and AICD  Awaiting EGD  Renal panel in AM   LOS: 3 Tala Eber W @TODAY @9 :48 AM

## 2011-01-03 NOTE — Op Note (Signed)
EGD shows large shallow gastric ulcer not actively bleeding. Tranfuse as needed, followup as OP 6 weeks to arrange repeat EGD to document healing.

## 2011-01-04 DIAGNOSIS — E119 Type 2 diabetes mellitus without complications: Secondary | ICD-10-CM

## 2011-01-04 DIAGNOSIS — K259 Gastric ulcer, unspecified as acute or chronic, without hemorrhage or perforation: Secondary | ICD-10-CM | POA: Diagnosis present

## 2011-01-04 LAB — GLUCOSE, CAPILLARY
Glucose-Capillary: 286 mg/dL — ABNORMAL HIGH (ref 70–99)
Glucose-Capillary: 211 mg/dL — ABNORMAL HIGH (ref 70–99)

## 2011-01-04 LAB — CBC
Hemoglobin: 8 g/dL — ABNORMAL LOW (ref 13.0–17.0)
MCHC: 30.7 g/dL (ref 30.0–36.0)
RDW: 15.5 % (ref 11.5–15.5)
WBC: 12 10*3/uL — ABNORMAL HIGH (ref 4.0–10.5)

## 2011-01-04 LAB — RENAL FUNCTION PANEL
CO2: 19 mEq/L (ref 19–32)
Calcium: 9.9 mg/dL (ref 8.4–10.5)
GFR calc Af Amer: 29 mL/min — ABNORMAL LOW (ref >90)
GFR calc non Af Amer: 25 mL/min — ABNORMAL LOW (ref >90)
Phosphorus: 3.7 mg/dL (ref 2.3–4.6)
Potassium: 4.2 mEq/L (ref 3.5–5.1)
Sodium: 132 mEq/L — ABNORMAL LOW (ref 135–145)

## 2011-01-04 MED ORDER — INSULIN GLARGINE 100 UNIT/ML ~~LOC~~ SOLN: SUBCUTANEOUS | Status: DC

## 2011-01-04 MED ORDER — DSS 100 MG PO CAPS: 100.0000 mg | ORAL_CAPSULE | Freq: Two times a day (BID) | ORAL | Status: AC

## 2011-01-04 MED ORDER — ACETAMINOPHEN 325 MG PO TABS: 650.0000 mg | ORAL_TABLET | Freq: Four times a day (QID) | ORAL | Status: AC | PRN

## 2011-01-04 MED ORDER — FERROUS SULFATE 325 (65 FE) MG PO TABS: 325.0000 mg | ORAL_TABLET | Freq: Three times a day (TID) | ORAL | Status: DC

## 2011-01-04 MED ORDER — PANTOPRAZOLE SODIUM 40 MG PO TBEC: 40.0000 mg | DELAYED_RELEASE_TABLET | Freq: Every day | ORAL | Status: DC

## 2011-01-04 NOTE — Discharge Summary (Signed)
Patient Name:  Adam [name]  MRN: [id number]  PCP: Bethel Born, MD  DOB:  1966/01/22   CSN: [id number]    Date of Admission:  12/31/2010  Date of Discharge:  01/04/2011   CSN: [id number]    Attending Physician: Dr. Zoila Shutter      DISCHARGE DIAGNOSES:    Anemia   Gastric ulcer   DIABETES MELLITUS, TYPE II  Chronic Kidney disease  Hypertension   Morbid obesity  DISPOSITION AND FOLLOW-UP: Adam [name] is to follow-up with the listed providers as detailed below.   Follow-up Information    Follow up with Vertell Novak, MD on 02/10/2011. (at 2:30)    Contact information:   [address], [address] [employer], [place]. [place] [address] [phone]       Follow up with PATEL,RAVI on 01/07/2011. (8:45)    Contact information:   [address] [place] [address] [phone]       Follow up with Lauris Poag, MD on 01/08/2011. (at 2:15 pm)    Contact information:   [address] [employer] [place] [address] [phone]         Discharge Orders    Future Appointments: Provider: Department: Dept Phone: Center:   01/07/2011 8:45 AM [facility] [phone] [facility]     Future Orders Please Complete By Expires   Diet - low sodium heart healthy      Increase activity slowly      Call MD for:  persistant dizziness or light-headedness      Call MD for:  extreme fatigue      (HEART FAILURE PATIENTS) Call MD:  Anytime you have any of the following symptoms: 1) 3 pound weight gain in 24 hours or 5 pounds in 1 week 2) shortness of breath, with or without a dry hacking cough 3) swelling in the hands, feet or stomach 4) if you have to sleep on extra pillows at night in order to breathe.      Call MD for:  persistant nausea and vomiting      Call MD for:  difficulty breathing, headache or visual disturbances          DISCHARGE MEDICATIONS: Current Discharge  Medication List    START taking these medications   Details  acetaminophen (TYLENOL) 325 MG tablet Take 2 tablets (650 mg total) by mouth every 6 (six) hours as needed (or Fever >/= 101). Qty: 60 tablet, Refills: 3    docusate sodium 100 MG CAPS Take 100 mg by mouth 2 (two) times daily. Qty: 30 capsule, Refills: 3    ferrous sulfate 325 (65 FE) MG tablet Take 1 tablet (325 mg total) by mouth 3 (three) times daily with meals. Qty: 90 tablet, Refills: 3    pantoprazole (PROTONIX) 40 MG tablet Take 1 tablet (40 mg total) by mouth daily at 12 noon. Qty: 30 tablet, Refills: 3      CONTINUE these medications which have CHANGED   Details  !! insulin glargine (LANTUS SOLOSTAR) 100 UNIT/ML injection Inject 55 Units subcutaneously once before bedtime Qty: 10 mL, Refills: 11   Associated Diagnoses: Type II or unspecified type diabetes mellitus without mention of complication, not stated as uncontrolled; Varicose veins of left lower extremity with pain; GERD (gastroesophageal reflux disease); GERD (gastroesophageal reflux disease); Seasonal allergies; Anxiety; Hypertension; Hyperlipidemia; Erectile dysfunction; HTN (hypertension)     !! - Potential duplicate medications found. Please  discuss with provider.    CONTINUE these medications which have NOT CHANGED   Details  fluticasone (FLONASE) 50 MCG/ACT nasal spray Place 2 sprays into the nose daily. Qty: 16 g, Refills: 2   Associated Diagnoses: Seasonal allergies; Type II or unspecified type diabetes mellitus without mention of complication, not stated as uncontrolled; Varicose veins of left lower extremity with pain; GERD (gastroesophageal reflux disease); GERD (gastroesophageal reflux disease); Anxiety; Hypertension; Hyperlipidemia; Erectile dysfunction; HTN (hypertension)    !! insulin glargine (LANTUS) 100 UNIT/ML injection Inject 55 Units into the skin at bedtime.      LORazepam (ATIVAN) 0.5 MG tablet Take 0.5 mg by mouth 2 (two) times daily  as needed.     Associated Diagnoses: Type II or unspecified type diabetes mellitus without mention of complication, not stated as uncontrolled; Varicose veins of left lower extremity with pain; GERD (gastroesophageal reflux disease); GERD (gastroesophageal reflux disease); Seasonal allergies; Anxiety; Hypertension; Hyperlipidemia; Erectile dysfunction; HTN (hypertension)    metFORMIN (GLUCOPHAGE) 1000 MG tablet Take 1 tablet (1,000 mg total) by mouth 2 (two) times daily with a meal. Qty: 62 tablet, Refills: 11   Associated Diagnoses: Type II or unspecified type diabetes mellitus without mention of complication, not stated as uncontrolled; Varicose veins of left lower extremity with pain; GERD (gastroesophageal reflux disease); GERD (gastroesophageal reflux disease); Seasonal allergies; Anxiety; Hypertension; Hyperlipidemia; Erectile dysfunction; HTN (hypertension)    metoprolol (LOPRESSOR) 100 MG tablet Take 1 tablet (100 mg total) by mouth 2 (two) times daily. Qty: 62 tablet, Refills: 11   Associated Diagnoses: Type II or unspecified type diabetes mellitus without mention of complication, not stated as uncontrolled; Varicose veins of left lower extremity with pain; GERD (gastroesophageal reflux disease); GERD (gastroesophageal reflux disease); Seasonal allergies; Anxiety; Hypertension; Hyperlipidemia; Erectile dysfunction; HTN (hypertension)    simvastatin (ZOCOR) 40 MG tablet Take 1 tablet (40 mg total) by mouth daily. Qty: 30 tablet, Refills: 11   Associated Diagnoses: Hyperlipidemia; Type II or unspecified type diabetes mellitus without mention of complication, not stated as uncontrolled; Varicose veins of left lower extremity with pain; GERD (gastroesophageal reflux disease); GERD (gastroesophageal reflux disease); Seasonal allergies; Anxiety; Hypertension; Erectile dysfunction; HTN (hypertension)    spironolactone (ALDACTONE) 25 MG tablet Take 1 tablet (25 mg total) by mouth daily. Qty: 31  tablet, Refills: 11   Associated Diagnoses: Hypertension; Type II or unspecified type diabetes mellitus without mention of complication, not stated as uncontrolled; Varicose veins of left lower extremity with pain; GERD (gastroesophageal reflux disease); GERD (gastroesophageal reflux disease); Seasonal allergies; Anxiety; Hyperlipidemia; Erectile dysfunction; HTN (hypertension)    tadalafil (CIALIS) 20 MG tablet Take 1 tablet (20 mg total) by mouth as needed. Qty: 10 tablet, Refills: 5   Associated Diagnoses: Erectile dysfunction; Type II or unspecified type diabetes mellitus without mention of complication, not stated as uncontrolled; Varicose veins of left lower extremity with pain; GERD (gastroesophageal reflux disease); GERD (gastroesophageal reflux disease); Seasonal allergies; Anxiety; Hypertension; Hyperlipidemia; HTN (hypertension)     !! - Potential duplicate medications found. Please discuss with provider.    STOP taking these medications     esomeprazole (NEXIUM) 20 MG capsule      potassium chloride SA (KLOR-CON M20) 20 MEQ tablet      quinapril (ACCUPRIL) 40 MG tablet      torsemide (DEMADEX) 100 MG tablet         CONSULTS:   Renal was consulted, Dr.  Garnetta Buddy saw patient.  GI was consulted, Dr. Freddy Jaksch saw patient  and advised endoscopy + colonoscopy. DR.  Carman Ching did EGD for patient. Dr. Barrie Folk followed up.  PROCEDURES PERFORMED:  US Renal  01/01/2011  *RADIOLOGY REPORT*  Clinical Data: Acute renal failure.  RENAL/URINARY TRACT ULTRASOUND COMPLETE  Comparison:  06/20/2009.  Findings:  Right Kidney:  14.7 cm. No hydronephrosis or renal mass.  Left Kidney:  15.0 cm.  Evaluation slightly limited by habitus/bowel gas.  No gross abnormality noted.  Bladder:  No focal mass identified.  IMPRESSION: No hydronephrosis.  Evaluation of the left kidney slightly limited by the patient's habitus/bowel gas.  Original Report Authenticated By: Fuller Canada, M.D.        ADMISSION DATA: H&P: The patient is a 45 yo man, history of HTN, DM, CHF, and CKD, presenting with anemia. The patient presented to his Nephrologist, Dr. Lowell Guitar, on the day prior to admission for lab work, where he was reportedly found to have a hemoglobin of 7.9. The patient was called today by their office, and told to present to the hospital, which he did. The patient notes no acute complaints, saying "I feel fine". He notes no lightheadedness, dizziness, shortness of breath, nausea, vomiting, hematemesis, hemoptysis, abdominal pain, hematuria, or current hematochezia. He does note that 2 weeks ago he experienced some straining with defecation, and subsequently had some red blood in his stool for a couple of days, which resolved. He notes no similar previous episodes of bleeding. The patient notes no chest pain, shortness of breath, cough, or LE edema.  Physical Exam: Blood pressure 107/66, pulse 87, temperature 98.7 F (37.1 C), temperature source Oral, resp. rate 16, SpO2 100.00%.  General: alert, cooperative, and in no apparent distress HEENT: mild scleral icterus present, pupils equal round and reactive to light, vision grossly intact, oropharynx clear and non-erythematous  Neck: supple, no lymphadenopathy, no JVD Lungs: clear to ascultation bilaterally, normal work of respiration, no wheezes, rales, ronchi Heart: regular rate and rhythm, no murmurs, gallops, or rubs, pacemaker present Abdomen: soft, non-tender, non-distended, normal bowel sounds  Rectal (per ED physician): brown stool, multiple internal hemorrhoids palpated, guiac positive Msk: no joint edema, warmth, or erythema  Extremities: trace non-pitting edema, no cyanosis or clubbing Neurologic: alert & oriented X3, cranial nerves II-XII intact, strength grossly intact, sensation intact to light touch  Labs:  [facility]  12/31/10 1748   NA  134*   K  4.5   CL  96   CO2  21   GLUCOSE  256*   BUN  126*   CREATININE   3.71*   CALCIUM  9.7   MG  --   PHOS  --    Prior Creatinine Levels 1.78     05/23/10 1.7     06/07/09 2.0     05/28/09 1.4     03/20/09 1.04     01/01/09  Liver Function Tests:  Basename  12/31/10 1748   AST  15   ALT  8   ALKPHOS  89   BILITOT  1.0   PROT  8.5*   ALBUMIN  3.8    CBC:  Basename  12/31/10 1748   WBC  8.8   NEUTROABS  --   HGB  7.5*   HCT  24.9*   MCV  77.6*   PLT  255    Prior Hb results: 10.0    05/23/10 10.3    05/28/09 13.5    09/01/08 13.2    12/30/06   FOBT: positive  Iron Studies 05/23/10  Ferritin: 26 TIBC: 484 % Saturation: 12 Iron: 56 Vit B: 340   HOSPITAL COURSE: Present on Admission:   1. Anemia - It is most likely due to the combination of chronic renal disease and acute GI bleeding. Baseline Hb is around 10 at April, 2012. Subjectively asymptomatic. Patient had some red blood in his stool for a couple of days before admission. FOBT positive. GI consulted. Dr. Willis Modena saw patient. He advised inpatient endoscopy + colonoscopy. Patient only agreed to do EGD which was done on  12/6 by Dr. Tresea Mall. EGD showed gastric antrum  Ulcer which is not actively bleeding. During his stay in hospital, his Hb has been stabilized at 7.0 to 7.5. He did not have new bloody bowel movement or tarry stool. Patient was transferred with one Unit of PRBCs. At discharge, his Hb is 8.0. Patient's anemia panel showed typical iron deficient picture. Patient is discharged home on Iron supplement and PPI. Advised not to take any NSAID and avoid Alcohol. I provide him with a list of NASIDs that he should avoid. He will be followed up with Dr. Allena Katz at clinic at 01/07/11.  2. Acute kidney injury on Chronic renal insufficiency - patient has a history of CRI stage I, with Cr = 1.78 seven months ago. Patient follows with Dr. Lowell Guitar, Nephrology, with his most recent visit last week before admission, at which point labs were drawn. Renal consulted, Dr. Elvis Coil saw  patient, thinks acute on Chronic renal failure with anemia and blood loss. Suggested to check urinalysis and renal ultrasound, which came back negative. Patient Cre trends down from 3.71 to 2.89 at discharge.  4. Congestive Heart Failure - NICM with EF = 35%. There was no exacerbation during this admission.  We continued metoprolol for CHF.   5. HTN: during this admission, his blood pressure is not high possibly due to blood loss. Also given his elevated Cre, we hold his blood pressure medications. He may need to restart his blood pressure medications in the near future if his Bp becomes elevated again.  5. Diabetes - Hb A1C = 6.9. Controled with moderate SSI + 20 HS lantus during hospital. And restarted lanus 55 U at discharge.   6, Prophy - SCD's  DISCHARGE DATA: Vital Signs: BP 108/77  Pulse 88  Temp(Src) 98.3 F (36.8 C) (Oral)  Resp 20  Ht 6\' 1"  (1.854 m)  Wt 346 lb 5.5 oz (157.1 kg)  BMI 45.69 kg/m2  SpO2 94%  Labs: Results for orders placed during the hospital encounter of 12/31/10 (from the past 24 hour(s))  GLUCOSE, CAPILLARY     Status: Abnormal   Collection Time   01/03/11 12:57 PM      Component Value Range   Glucose-Capillary 170 (*) 70 - 99 (mg/dL)   Comment 1 Documented in Chart     Comment 2 Notify RN    GLUCOSE, CAPILLARY     Status: Abnormal   Collection Time   01/03/11  4:50 PM      Component Value Range   Glucose-Capillary 270 (*) 70 - 99 (mg/dL)   Comment 1 Documented in Chart     Comment 2 Notify RN    GLUCOSE, CAPILLARY     Status: Abnormal   Collection Time   01/03/11  9:51 PM      Component Value Range   Glucose-Capillary 286 (*) 70 - 99 (mg/dL)  CBC     Status: Abnormal   Collection Time   01/03/11  10:04 PM      Component Value Range   WBC 10.4  4.0 - 10.5 (K/uL)   RBC 3.38 (*) 4.22 - 5.81 (MIL/uL)   Hemoglobin 8.0 (*) 13.0 - 17.0 (g/dL)   HCT 64.4 (*) 03.4 - 52.0 (%)   MCV 77.5 (*) 78.0 - 100.0 (fL)   MCH 23.7 (*) 26.0 - 34.0 (pg)   MCHC  30.5  30.0 - 36.0 (g/dL)   RDW 74.2  59.5 - 63.8 (%)   Platelets 236  150 - 400 (K/uL)  RENAL FUNCTION PANEL     Status: Abnormal   Collection Time   01/04/11  5:00 AM      Component Value Range   Sodium 132 (*) 135 - 145 (mEq/L)   Potassium 4.2  3.5 - 5.1 (mEq/L)   Chloride 97  96 - 112 (mEq/L)   CO2 19  19 - 32 (mEq/L)   Glucose, Bld 198 (*) 70 - 99 (mg/dL)   BUN 98 (*) 6 - 23 (mg/dL)   Creatinine, Ser 7.56 (*) 0.50 - 1.35 (mg/dL)   Calcium 9.9  8.4 - 43.3 (mg/dL)   Phosphorus 3.7  2.3 - 4.6 (mg/dL)   Albumin 3.8  3.5 - 5.2 (g/dL)   GFR calc non Af Amer 25 (*) >90 (mL/min)   GFR calc Af Amer 29 (*) >90 (mL/min)  CBC     Status: Abnormal   Collection Time   01/04/11  9:00 AM      Component Value Range   WBC 12.0 (*) 4.0 - 10.5 (K/uL)   RBC 3.37 (*) 4.22 - 5.81 (MIL/uL)   Hemoglobin 8.0 (*) 13.0 - 17.0 (g/dL)   HCT 29.5 (*) 18.8 - 52.0 (%)   MCV 77.4 (*) 78.0 - 100.0 (fL)   MCH 23.7 (*) 26.0 - 34.0 (pg)   MCHC 30.7  30.0 - 36.0 (g/dL)   RDW 41.6  60.6 - 30.1 (%)   Platelets 243  150 - 400 (K/uL)      Signed: Lorretta Harp, MD, PhD  PGY II, Internal Medicine Resident 01/04/2011, 10:13 AM

## 2011-01-04 NOTE — Progress Notes (Signed)
Patient discharged home.  Discharge instruction explained by nurse.  Copies of all forms given to patient and explained.  Medications review with patient.  Left hand saline lock removed cathlon intact, no s/s of redness, pain or swelling.  Patient voiced understanding of all instructions with follow-up appointments. Patient discharged via wheelchair.

## 2011-01-04 NOTE — Progress Notes (Signed)
Subjective: Interval History: none.  Objective: Vital signs in last 24 hours:  Temp:  [97.6 F (36.4 C)-99.2 F (37.3 C)] 98.3 F (36.8 C) (12/08 0452) Pulse Rate:  [81-88] 88  (12/08 0901) Resp:  [12-64] 20  (12/08 0452) BP: (101-133)/(49-97) 108/77 mmHg (12/08 0901) SpO2:  [90 %-98 %] 94 % (12/08 0452) Weight:  [157.1 kg (346 lb 5.5 oz)] 346 lb 5.5 oz (157.1 kg) (12/07 2153)  Weight change: 1 kg (2 lb 3.3 oz)  Intake/Output: I/O last 3 completed shifts: In: 1230 [P.O.:480; I.V.:400; Blood:350] Out: 300 [Urine:300]   Intake/Output this shift:     CVS- RRR RS- CTA ABD- BS present soft non-distended EXT- no edema  Lab Results:  [facility] 01/04/11 0900 01/03/11 2204 01/03/11 0921  WBC 12.0* 10.4 9.1  HGB 8.0* 8.0* 7.3*  HCT 26.1* 26.2* 23.9*  PLT 243 236 231   BMET  Basename 01/04/11 0500 01/03/11 0920 01/02/11 0815  NA 132* 135 133*  K 4.2 4.1 4.5  CL 97 99 96  CO2 19 21 21   GLUCOSE 198* 191* 212*  BUN 98* 104* 114*  CREATININE 2.82* 2.91* 3.14*  CALCIUM 9.9 9.9 10.0  PHOS 3.7 4.1 4.8*   LFT  Basename 01/04/11 0500  PROT --  ALBUMIN 3.8  AST --  ALT --  ALKPHOS --  BILITOT --  BILIDIR --  IBILI --   PT/INR No results found for this basename: LABPROT:2,INR:2 in the last 72 hours Hepatitis Panel No results found for this basename: HEPBSAG,HCVAB,HEPAIGM,HEPBIGM in the last 72 hours  Studies/Results: No results found.  I have reviewed the patient's current medications.  Assessment/Plan: Acute on Chronic renal failure with anemia and blood loss. Patient was taking ACE inhibitor and metformin. These have been discontinued on admission. He is taking no diuretics. Check urinalysis and renal ultrasound. Will follow labs and anticipate return of renal function.  2) Anemia probably secondary to GI blood loss  3) Dm controlled with insulin  4) History of Cardiomyopathy and AICD   Stop Metformin and ACE inhibitor Will need blood work on Wednesday this  week at [facility] and follow up with Dr Lowell Guitar.      LOS: 4 Alphonzo Devera W @TODAY @10 :32 AM

## 2011-01-04 NOTE — Progress Notes (Signed)
Patient ID: Adam [name], male   DOB: [birth year], 45 y.o.   MRN: [id number] [facility] Progress Note  Subjective: No complaints, radio home  Objective: Vital signs in last 24 hours: Temp:  [97.6 F (36.4 C)-99.2 F (37.3 C)] 98.3 F (36.8 C) (12/08 0452) Pulse Rate:  [81-88] 88  (12/08 0452) Resp:  [12-64] 20  (12/08 0452) BP: (101-133)/(49-97) 108/77 mmHg (12/08 0452) SpO2:  [90 %-98 %] 94 % (12/08 0452) Weight:  [157.1 kg (346 lb 5.5 oz)] 346 lb 5.5 oz (157.1 kg) (12/07 2153) Weight change: 1 kg (2 lb 3.3 oz)   PE: Unchanged  Lab Results: Results for orders placed during the hospital encounter of 12/31/10 (from the past 24 hour(s))  RENAL FUNCTION PANEL     Status: Abnormal   Collection Time   01/03/11  9:20 AM      Component Value Range   Sodium 135  135 - 145 (mEq/L)   Potassium 4.1  3.5 - 5.1 (mEq/L)   Chloride 99  96 - 112 (mEq/L)   CO2 21  19 - 32 (mEq/L)   Glucose, Bld 191 (*) 70 - 99 (mg/dL)   BUN 782 (*) 6 - 23 (mg/dL)   Creatinine, Ser 9.56 (*) 0.50 - 1.35 (mg/dL)   Calcium 9.9  8.4 - 21.3 (mg/dL)   Phosphorus 4.1  2.3 - 4.6 (mg/dL)   Albumin 3.8  3.5 - 5.2 (g/dL)   GFR calc non Af Amer 24 (*) >90 (mL/min)   GFR calc Af Amer 28 (*) >90 (mL/min)  CBC     Status: Abnormal   Collection Time   01/03/11  9:21 AM      Component Value Range   WBC 9.1  4.0 - 10.5 (K/uL)   RBC 3.12 (*) 4.22 - 5.81 (MIL/uL)   Hemoglobin 7.3 (*) 13.0 - 17.0 (g/dL)   HCT 08.6 (*) 57.8 - 52.0 (%)   MCV 76.6 (*) 78.0 - 100.0 (fL)   MCH 23.4 (*) 26.0 - 34.0 (pg)   MCHC 30.5  30.0 - 36.0 (g/dL)   RDW 46.9  62.9 - 52.8 (%)   Platelets 231  150 - 400 (K/uL)  GLUCOSE, CAPILLARY     Status: Abnormal   Collection Time   01/03/11 12:57 PM      Component Value Range   Glucose-Capillary 170 (*) 70 - 99 (mg/dL)   Comment 1 Documented in Chart     Comment 2 Notify RN    GLUCOSE, CAPILLARY     Status: Abnormal   Collection Time   01/03/11  4:50 PM      Component Value Range   Glucose-Capillary 270 (*) 70 - 99 (mg/dL)   Comment 1 Documented in Chart     Comment 2 Notify RN    GLUCOSE, CAPILLARY     Status: Abnormal   Collection Time   01/03/11  9:51 PM      Component Value Range   Glucose-Capillary 286 (*) 70 - 99 (mg/dL)  CBC     Status: Abnormal   Collection Time   01/03/11 10:04 PM      Component Value Range   WBC 10.4  4.0 - 10.5 (K/uL)   RBC 3.38 (*) 4.22 - 5.81 (MIL/uL)   Hemoglobin 8.0 (*) 13.0 - 17.0 (g/dL)   HCT 41.3 (*) 24.4 - 52.0 (%)   MCV 77.5 (*) 78.0 - 100.0 (fL)   MCH 23.7 (*) 26.0 - 34.0 (pg)   MCHC 30.5  30.0 - 36.0 (  g/dL)   RDW 40.9  81.1 - 91.4 (%)   Platelets 236  150 - 400 (K/uL)    Studies/Results: CBC from today pending   Assessment: Gastric ulcer with presumed hemorrhage  Plan: 1. Continue double dose proton pump inhibitor 2. Avoid NSAIDs 3. Await pathology to rule out neoplasm and H. pyloric 4. Patient states he is going home today. Will range followup with Dr. Randa Evens as an outpatient    Chelesea Weiand C 01/04/2011, 7:31 AM

## 2011-01-05 LAB — TYPE AND SCREEN
ABO/RH(D): A POS
Unit division: 0

## 2011-01-06 ENCOUNTER — Encounter (HOSPITAL_COMMUNITY): Payer: Self-pay | Admitting: Gastroenterology

## 2011-01-07 ENCOUNTER — Encounter: Payer: Medicaid Other | Admitting: Internal Medicine

## 2011-01-07 ENCOUNTER — Ambulatory Visit: Payer: Medicaid Other | Admitting: Internal Medicine

## 2011-01-08 ENCOUNTER — Other Ambulatory Visit (INDEPENDENT_AMBULATORY_CARE_PROVIDER_SITE_OTHER): Payer: Medicaid Other

## 2011-01-08 ENCOUNTER — Other Ambulatory Visit: Payer: Self-pay | Admitting: Internal Medicine

## 2011-01-08 DIAGNOSIS — N189 Chronic kidney disease, unspecified: Secondary | ICD-10-CM

## 2011-01-08 LAB — BASIC METABOLIC PANEL WITH GFR
Calcium: 9.5 mg/dL (ref 8.4–10.5)
Creat: 2.66 mg/dL — ABNORMAL HIGH (ref 0.50–1.35)
GFR, Est Non African American: 28 mL/min — ABNORMAL LOW
Sodium: 138 mEq/L (ref 135–145)

## 2011-01-08 LAB — CBC
Platelets: 291 10*3/uL (ref 150–400)
RBC: 3.46 MIL/uL — ABNORMAL LOW (ref 4.22–5.81)
RDW: 18.8 % — ABNORMAL HIGH (ref 11.5–15.5)
WBC: 8.6 10*3/uL (ref 4.0–10.5)

## 2011-01-08 NOTE — Progress Notes (Signed)
Addended by: Bufford Spikes on: 01/08/2011 08:53 AM   Modules accepted: Orders

## 2011-01-14 ENCOUNTER — Encounter: Payer: Self-pay | Admitting: Internal Medicine

## 2011-01-14 ENCOUNTER — Ambulatory Visit (INDEPENDENT_AMBULATORY_CARE_PROVIDER_SITE_OTHER): Payer: Medicaid Other | Admitting: Internal Medicine

## 2011-01-14 DIAGNOSIS — I10 Essential (primary) hypertension: Secondary | ICD-10-CM

## 2011-01-14 DIAGNOSIS — N289 Disorder of kidney and ureter, unspecified: Secondary | ICD-10-CM

## 2011-01-14 DIAGNOSIS — I509 Heart failure, unspecified: Secondary | ICD-10-CM

## 2011-01-14 DIAGNOSIS — M79609 Pain in unspecified limb: Secondary | ICD-10-CM

## 2011-01-14 DIAGNOSIS — M79671 Pain in right foot: Secondary | ICD-10-CM | POA: Insufficient documentation

## 2011-01-14 DIAGNOSIS — E119 Type 2 diabetes mellitus without complications: Secondary | ICD-10-CM

## 2011-01-14 DIAGNOSIS — I428 Other cardiomyopathies: Secondary | ICD-10-CM

## 2011-01-14 LAB — CBC
HCT: 33.1 % — ABNORMAL LOW (ref 39.0–52.0)
Hemoglobin: 9.6 g/dL — ABNORMAL LOW (ref 13.0–17.0)
MCHC: 29 g/dL — ABNORMAL LOW (ref 30.0–36.0)
RBC: 3.86 MIL/uL — ABNORMAL LOW (ref 4.22–5.81)
WBC: 7.6 10*3/uL (ref 4.0–10.5)

## 2011-01-14 LAB — BASIC METABOLIC PANEL WITH GFR
Chloride: 109 mEq/L (ref 96–112)
GFR, Est Non African American: 30 mL/min — ABNORMAL LOW
Potassium: 3.8 mEq/L (ref 3.5–5.3)
Sodium: 140 mEq/L (ref 135–145)

## 2011-01-14 NOTE — Assessment & Plan Note (Addendum)
Patient with significant weight gain- as per the chart. Recent hospital admission for anemia and GI bleeding- EGD showing 2 cm nonbleeding antral ulcer. Hemoglobin stable about 8 before discharge. Acute and chronic kidney injury on admission. Creatinine was trending down before discharge.  Due to his weight gain and fluid overload- though his respiratory status is okay, and as I cannot play with diuretics due to his recent AKI, I recommended her to make an early appointment with Dr. Tenny Craw. Also advised him to check his weight daily. Advised him to start back spironolactone 25 mg daily- unless he has any contraindications after BMP today. I will give him a call in that case.

## 2011-01-14 NOTE — Patient Instructions (Signed)
Please make appointment in 4-6 weeks or earlier as needed.  Try to make an early appointment with Dr. Tenny Craw.  Check your weight daily and if it keeps on going up- follow up with Dr. Lowell Guitar.  Start taking spironolactone 25 mg daily. I will give you a call if you don't have to take it- after the tests. If I don't give you a call- start taking it.  Take all your medications regularly.

## 2011-01-14 NOTE — Progress Notes (Signed)
  Subjective:    Patient ID: Adam [name], male    DOB: [birth year], 45 y.o.   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  He was recently admitted to the hospital for anemia and GI bleeding and was found to have gastric antral ulcer per EGD by Dr. Madilyn Fireman. The ulcer was not actively bleeding though. No colonoscopy was performed. Biopsy results still pending. Appointment with Dr. Madilyn Fireman in January 2013.  He denies any blood in stool, nausea or vomiting or abdominal pain.  He reports retaining more fluid- and feels he has fluid all over the body, though he does not weigh himself regularly. His weight has increased about 21 pounds since past 11 days- but the previous weight is from hospital admission- so bias in scale is possible- but still he reports being feeling overloaded.  He does complain of some shortness of breath with walking more than one block which is new for past 2 weeks. She has an appointment with his cardiologist Dr. Tenny Craw in January 2013.  Denies any fever, chills, headache, chest pain, palpitations.   He also complains of right mid foot pain- which started about 2 months before- after he twisted his right foot. The pain goes away for to 3 days and then comes back. He feels swelling in his midfoot and starts having pain which is worse when he walks or does any weight on his right leg. The pain gets better with rest.   Review of Systems     as per history of present illness, all other systems reviewed and negative.  Objective:   Physical Exam  General: NAD HEENT: PERRL, EOMI, no scleral icterus Cardiac: S1, S2, RRR, no rubs, murmurs or gallops Pulm: Mild bibasilar crackles, no wheezes. Good air entry. Abd: soft, nontender, nondistended, BS present Ext: 1-2+ pedal edema bilaterally. Right mid foot- minimal tenderness to  palpation. No redness. No ankle or big toe swelling. Neuro: alert and oriented X3, cranial nerves II-XII grossly intact       Assessment & Plan:

## 2011-01-14 NOTE — Assessment & Plan Note (Signed)
Patient creatinine bumped up to 3 point during recent hospital admission. Metformin and ACE inhibitor were stopped.  He followed with Dr. Lowell Guitar About a week before and he started him back on torsemide 100 mg by mouth twice a day.  He still is gaining weight. I will recheck his BMP today and will recommend any change in diuretics after the results.  He might need to followup earlier with Dr. Lowell Guitar if his volume status and creatinine does not improve.

## 2011-01-14 NOTE — Assessment & Plan Note (Addendum)
Lab Results  Component Value Date   NA 138 01/08/2011   K 3.8 01/08/2011   CL 102 01/08/2011   CO2 21 01/08/2011   BUN 77* 01/08/2011   CREATININE 2.66* 01/08/2011   CREATININE 2.82* 01/04/2011    BP Readings from Last 3 Encounters:  01/14/11 130/76  01/04/11 108/77  01/04/11 108/77    Assessment: Hypertension control:  controlled  Progress toward goals:  unchanged Barriers to meeting goals:  no barriers identified  Plan: Hypertension treatment:  continue current medications, Lopressor, Aldactone, torsemide.

## 2011-01-14 NOTE — Assessment & Plan Note (Signed)
Lab Results  Component Value Date   HGBA1C 6.9 11/19/2010   HGBA1C 12.1 02/13/2010   CREATININE 2.66* 01/08/2011   CREATININE 2.82* 01/04/2011   MICROALBUR 1.34 01/01/2011   MICRALBCREAT 21.8 01/01/2011   CHOL 74 05/28/2009   HDL 20.20* 05/28/2009   TRIG 53.0 05/28/2009    Last eye exam and foot exam:    Component Value Date/Time   HMDIABFOOTEX done 01/30/2010    Assessment: Diabetes control: controlled Progress toward goals: improved Barriers to meeting goals: no barriers identified  Plan: Diabetes treatment: continue current medications. Metformin recently stopped during hospital admission due to acute and chronic kidney injury. Will hold metformin for now. Refer to: none Instruction/counseling given: reminded to bring medications to each visit, discussed the need for weight loss and discussed diet

## 2011-01-14 NOTE — Assessment & Plan Note (Signed)
As described in history of present illness, patient started having right foot pain after twisting injury about 2 months before. He has pain and intermittent swelling of his midfoot.  Ideal regimen for him would be NSAIDs along with heat and decreasing pressure on right foot. 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  I recommended him to try avoiding more pressure on right foot, apply heat about 5 minutes every hour, take Tylenol as needed. If his pain doesn't get better, I will recommend him referring to sports medicine clinic.

## 2011-01-15 NOTE — Progress Notes (Signed)
CBC is stable and renal function is improving.  Electrolytes are also within normal limits.  Follow-up with PCP as scheduled.

## 2011-01-16 ENCOUNTER — Telehealth: Payer: Self-pay | Admitting: *Deleted

## 2011-01-16 NOTE — Telephone Encounter (Signed)
Pt calls and states edema in legs is worse, wants to know if he can "take a booster of fluid pills" he is told that the nurse cannot advise except to tell him to listen to the instructions given by the md. An appt is set for fri 12/21 at 1045 per chilonb. He is instructed that if his edema becomes worse, has shortness of breath, weakness, dizziness he is to go to ED or urg care, he is agreeable

## 2011-01-16 NOTE — Telephone Encounter (Signed)
Can probably wait till Friday appt.

## 2011-01-17 ENCOUNTER — Ambulatory Visit (INDEPENDENT_AMBULATORY_CARE_PROVIDER_SITE_OTHER): Payer: Medicaid Other | Admitting: Internal Medicine

## 2011-01-17 ENCOUNTER — Encounter: Payer: Self-pay | Admitting: Internal Medicine

## 2011-01-17 DIAGNOSIS — N289 Disorder of kidney and ureter, unspecified: Secondary | ICD-10-CM

## 2011-01-17 DIAGNOSIS — I509 Heart failure, unspecified: Secondary | ICD-10-CM

## 2011-01-17 DIAGNOSIS — E119 Type 2 diabetes mellitus without complications: Secondary | ICD-10-CM

## 2011-01-17 DIAGNOSIS — K259 Gastric ulcer, unspecified as acute or chronic, without hemorrhage or perforation: Secondary | ICD-10-CM

## 2011-01-17 MED ORDER — METOLAZONE 10 MG PO TABS: 10.0000 mg | ORAL_TABLET | Freq: Every day | ORAL | Status: DC

## 2011-01-17 NOTE — Assessment & Plan Note (Addendum)
As discussed in history of present illness, I talked with Dr. Hyman Hopes on phone and started him on metolazone 10 mg daily for one week along with torsemide. We'll recheck his BMP and CBC today. Repeat labs with BMP on 01/24/2011.  His creatinine was not getting worse during last visit- but not getting much better too.  He will call our clinic or Dr. Hyman Hopes or Dr. Lowell Guitar for further questions or concerns. He has cell phone number of Dr. Hyman Hopes and office number for Dr. Lowell Guitar. He verbalized understanding. When we will see him in our clinic the next visit a week after, if any questions, call Dr. Hyman Hopes or Dr. Lowell Guitar with [facility].

## 2011-01-17 NOTE — Assessment & Plan Note (Signed)
Hold metformin for now- in lieu of creatinine more than 1.5.

## 2011-01-17 NOTE — Progress Notes (Signed)
  Subjective:    Patient ID: Adam [name], male    DOB: [birth year], 45 y.o.   MRN: [id number]  HPI [name] is a 45 year old man with past with history of DM 2, cardio myopathy and CHF, chronic kidney disease-with acute injury recently comes to the clinic due to worsening leg swelling and weight gain.  I saw him a week before for a hospital followup- which was admitted for acute GI bleeding. During that visit his creatinine was not getting worse and hemoglobin was trending up.  He comes today with 8 pound weight gain in 3 days and increase in bilateral leg swelling -compared to when I saw him on 01/14/2011. He reports using torsemide 100 mg by mouth twice a day - but is unclear if it's taking it . I talked with Dr. Hyman Hopes from [facility]  On phone- as he has mentioned this to the patient to call him if needed . He recommended giving metolazone 10 mg daily for a week - which would make him to lose 1-2 pounds daily- and see him back after a week for repeat BMP. He is to come earlier if his weight stayed stable or keep on increasing.  He said that he had an appointment in January with Dr. Tenny Craw- his cardiologist , but when Jasmine December called the office , she did not have one . We made an appointment for him on 01/23/2011 at 10:15 AM with Dr. Tenny Craw .   also he is carrying a paper with written medications and not printed and is unclear of what he is taking. I had a long discussion with him about taking torsemide and metolazone for next week and check his weight daily- which he says that he knows when he is gaining weight or not- and does not have anything to check weight at home.  His vitals are stable and saturations okay. He does not seem to be in acute distress.  Denies any fever, chills, nausea vomiting, abdominal pain, chest pain, short of breath.   Review of Systems     as per history of present illness, all other systems reviewed and negative. Objective:   Physical  Exam  General: NAD HEENT: PERRL, EOMI, no scleral icterus Cardiac: S1, S2, RRR, no rubs, murmurs or gallops Pulm: Mild crackles at bilateral bases, no wheezes, moving normal volumes of air. Abd: soft, nontender, nondistended, BS present Ext: 2+ pedal edema bilaterally.  Neuro: alert and oriented X3, cranial nerves II-XII grossly intact. Strength and sensations equal and normal bilaterally.       Assessment & Plan:

## 2011-01-17 NOTE — Assessment & Plan Note (Signed)
No worsening of respiratory status. Increased pedal edema.  After talking with Dr. Hyman Hopes, I will start him on metolazone 10 mg daily. Recommended him to continue torsemide at the dosage Dr. Lowell Guitar recommended. He says that he told him to take 100 mg twice a day, but I don't have any records or documentation for that.  I told him to continue the dose and if he has any confusion, call Dr. Lowell Guitar clinic or our clinic.  He is going to see Dr. Tenny Craw on 01/23/2011 and was seen back in our clinic on 01/24/2011.

## 2011-01-17 NOTE — Assessment & Plan Note (Signed)
Will recheck CBC today. Hemoglobin was getting better than last visit. Has followup appointment with GI Dr. next month.

## 2011-01-17 NOTE — Patient Instructions (Signed)
Start taking metolazone- the booster water pill- 10 mg daily for next 7 days. You should lose 1-2 pounds daily- we are self everyday in morning before breakfast- and keep the log of weight and  bring it  during next visit.  You have an appointment with Dr. Tenny Craw on 01/23/2011 . Followup without missing it .  Make an appointment on 01/24/2011 in our clinic.   Continue taking torsemide 100 mg twice a day and all other medications.   I will give you a call if the lab tests shows something to be changed sooner.

## 2011-01-18 LAB — BASIC METABOLIC PANEL WITH GFR
BUN: 55 mg/dL — ABNORMAL HIGH (ref 6–23)
CO2: 17 mEq/L — ABNORMAL LOW (ref 19–32)
Chloride: 109 mEq/L (ref 96–112)
Creat: 2.49 mg/dL — ABNORMAL HIGH (ref 0.50–1.35)
Glucose, Bld: 104 mg/dL — ABNORMAL HIGH (ref 70–99)
Potassium: 4.3 mEq/L (ref 3.5–5.3)

## 2011-01-18 LAB — CBC
HCT: 34.4 % — ABNORMAL LOW (ref 39.0–52.0)
Hemoglobin: 10.1 g/dL — ABNORMAL LOW (ref 13.0–17.0)
MCV: 86.9 fL (ref 78.0–100.0)
RBC: 3.96 MIL/uL — ABNORMAL LOW (ref 4.22–5.81)
WBC: 7.2 10*3/uL (ref 4.0–10.5)

## 2011-01-23 ENCOUNTER — Ambulatory Visit (INDEPENDENT_AMBULATORY_CARE_PROVIDER_SITE_OTHER): Payer: Medicaid Other | Admitting: Internal Medicine

## 2011-01-23 ENCOUNTER — Encounter: Payer: Self-pay | Admitting: Internal Medicine

## 2011-01-23 DIAGNOSIS — G4733 Obstructive sleep apnea (adult) (pediatric): Secondary | ICD-10-CM

## 2011-01-23 DIAGNOSIS — R0602 Shortness of breath: Secondary | ICD-10-CM

## 2011-01-23 DIAGNOSIS — I428 Other cardiomyopathies: Secondary | ICD-10-CM

## 2011-01-23 DIAGNOSIS — I119 Hypertensive heart disease without heart failure: Secondary | ICD-10-CM

## 2011-01-23 DIAGNOSIS — G4730 Sleep apnea, unspecified: Secondary | ICD-10-CM

## 2011-01-23 DIAGNOSIS — E785 Hyperlipidemia, unspecified: Secondary | ICD-10-CM

## 2011-01-23 LAB — CBC WITH DIFFERENTIAL/PLATELET
Basophils Absolute: 0.1 10*3/uL (ref 0.0–0.1)
Basophils Relative: 0.7 % (ref 0.0–3.0)
Eosinophils Absolute: 0 10*3/uL (ref 0.0–0.7)
Hemoglobin: 10.9 g/dL — ABNORMAL LOW (ref 13.0–17.0)
Lymphocytes Relative: 20.5 % (ref 12.0–46.0)
MCHC: 32 g/dL (ref 30.0–36.0)
Monocytes Relative: 9.2 % (ref 3.0–12.0)
Neutro Abs: 4.9 10*3/uL (ref 1.4–7.7)
Neutrophils Relative %: 69.6 % (ref 43.0–77.0)
RBC: 4.04 Mil/uL — ABNORMAL LOW (ref 4.22–5.81)
RDW: 26.3 % — ABNORMAL HIGH (ref 11.5–14.6)

## 2011-01-23 LAB — BASIC METABOLIC PANEL
CO2: 28 mEq/L (ref 19–32)
Calcium: 9.4 mg/dL (ref 8.4–10.5)
Chloride: 99 mEq/L (ref 96–112)
Creatinine, Ser: 3 mg/dL — ABNORMAL HIGH (ref 0.4–1.5)
Glucose, Bld: 143 mg/dL — ABNORMAL HIGH (ref 70–99)

## 2011-01-23 LAB — TSH: TSH: 2.17 u[IU]/mL (ref 0.35–5.50)

## 2011-01-23 NOTE — Progress Notes (Addendum)
HPI Patient is a 45 year old with a history of NICM, HTN, Renal insuff.  I saw him in June.  Previous echo showed LVEF 30^, RVEF also depressed. Since seen he has been seen by A. Powell.  Demedex dose adjusted and edema is improving.  Knows he has extra fluid. Also had EGD by Dr. Elvina Mattes  Found peptic ulcer.  No Known Allergies  Current Outpatient Prescriptions  Medication Sig Dispense Refill  . acetaminophen (TYLENOL) 325 MG tablet Take 650 mg by mouth every 6 (six) hours as needed.        . docusate sodium (COLACE) 100 MG capsule Take 100 mg by mouth 2 (two) times daily.        . ferrous sulfate 325 (65 FE) MG tablet Take 1 tablet (325 mg total) by mouth 3 (three) times daily with meals.  90 tablet  3  . fluticasone (FLONASE) 50 MCG/ACT nasal spray Place 2 sprays into the nose daily.  16 g  2  . insulin glargine (LANTUS SOLOSTAR) 100 UNIT/ML injection Inject 55 Units subcutaneously once before bedtime  10 mL  11  . LORazepam (ATIVAN) 0.5 MG tablet Take 0.5 mg by mouth 2 (two) times daily as needed.        . metoprolol (LOPRESSOR) 100 MG tablet Take 1 tablet (100 mg total) by mouth 2 (two) times daily.  62 tablet  11  . pantoprazole (PROTONIX) 40 MG tablet Take 1 tablet (40 mg total) by mouth daily at 12 noon.  30 tablet  3  . simvastatin (ZOCOR) 40 MG tablet Take 1 tablet (40 mg total) by mouth daily.  30 tablet  11  . spironolactone (ALDACTONE) 25 MG tablet Take 1 tablet (25 mg total) by mouth daily.  31 tablet  11  . tadalafil (CIALIS) 20 MG tablet Take 1 tablet (20 mg total) by mouth as needed.  10 tablet  5  . torsemide (DEMADEX) 100 MG tablet Take 100 mg by mouth 2 (two) times daily.        Marland Kitchen DISCONTD: LORazepam (ATIVAN) 0.5 MG tablet Take 1 tablet (0.5 mg total) by mouth 2 (two) times daily as needed (for nerves).  60 tablet  0    Past Medical History  Diagnosis Date  . Other and unspecified hyperlipidemia   . Insomnia, unspecified   . Obstructive sleep apnea (adult) (pediatric)    . Abdominal pain, left upper quadrant   . Allergic rhinitis, cause unspecified   . Other primary cardiomyopathies   . Unspecified essential hypertension   . Type II or unspecified type diabetes mellitus without mention of complication, not stated as uncontrolled   . Type II or unspecified type diabetes mellitus without mention of complication, not stated as uncontrolled   . Chronic kidney disease, stage I   . Esophageal reflux   . Morbid obesity   . Dysuria   . Tobacco use disorder   . Dysrhythmia     Past Surgical History  Procedure Date  . Cardiac defibrillator placement 2011  . Esophagogastroduodenoscopy 01/03/2011    Procedure: ESOPHAGOGASTRODUODENOSCOPY (EGD);  Surgeon: Vertell Novak., MD;  Location: [facility] ENDOSCOPY;  Service: Endoscopy;  Laterality: N/A;    Family History  Problem Relation Age of Onset  . Diabetes Mother   . Microcephaly Father     History   Social History  . Marital Status: Single    Spouse Name: N/A    Number of Children: N/A  . Years of Education: N/A  Occupational History  . Not on file.   Social History Main Topics  . Smoking status: Current Everyday Smoker -- 0.2 packs/day    Types: Cigarettes  . Smokeless tobacco: Never Used   Comment: Will stop on his own - cutting back now  . Alcohol Use: No  . Drug Use: No  . Sexually Active: Yes   Other Topics Concern  . Not on file   Social History Narrative  . No narrative on file    Review of Systems:  All systems reviewed.  They are negative to the above problem except as previously stated.  Vital Signs: BP 138/78  Pulse 80  Ht 6\' 1"  (1.854 m)  Wt 360 lb 12.8 oz (163.658 kg)  BMI 47.60 kg/m2  Physical Exam Patient is in NAD HEENT:  Normocephalic, atraumatic. EOMI, PERRLA.  Neck: JVP is increased. No thyromegaly. No bruits.  Lungs: clear to auscultation. No rales no wheezes.  Heart: Regular rate and rhythm. Normal S1, S2. Soft S3.  Positive RV heave..   No significant  murmurs. PMI not displaced.  Abdomen:  Supple, nontender. Normal bowel sounds. No masses. No hepatomegaly.  Extremities:   Good distal pulses throughout. 2+ LE edema.  Musculoskeletal :moving all extremities.  Neuro:   alert and oriented x3.  CN II-XII grossly intact.  EKG:  Probable SR with PACs.  80 bpm.  First degree AV block.  Anteroseptal MI.  Lateral MI.  Assessment and Plan:

## 2011-01-23 NOTE — Patient Instructions (Signed)
Lab work today We will call you with results.  Appointment with Dr,Ross in 3 to 4 months

## 2011-01-23 NOTE — Assessment & Plan Note (Signed)
Excellent control.   

## 2011-01-23 NOTE — Assessment & Plan Note (Signed)
Volume status is increased.  I would repeat BMET today.   I need to review echo.  RV on last 2 echoes showed depressed function He has a history of sleep apnea but his CPAP broke  I would repeat testing to confirm and reinititate.  This may be exacerbating.  QUes if he should have R heart cath if fails to improve.

## 2011-01-23 NOTE — Assessment & Plan Note (Signed)
See CHF

## 2011-01-30 ENCOUNTER — Encounter: Payer: Self-pay | Admitting: Internal Medicine

## 2011-01-30 ENCOUNTER — Ambulatory Visit (INDEPENDENT_AMBULATORY_CARE_PROVIDER_SITE_OTHER): Payer: Medicaid Other | Admitting: Internal Medicine

## 2011-01-30 VITALS — BP 121/78 | HR 83 | Temp 97.0°F | Ht 72.5 in | Wt 349.9 lb

## 2011-01-30 DIAGNOSIS — I10 Essential (primary) hypertension: Secondary | ICD-10-CM

## 2011-01-30 DIAGNOSIS — E119 Type 2 diabetes mellitus without complications: Secondary | ICD-10-CM

## 2011-01-30 DIAGNOSIS — M7989 Other specified soft tissue disorders: Secondary | ICD-10-CM

## 2011-01-30 DIAGNOSIS — N289 Disorder of kidney and ureter, unspecified: Secondary | ICD-10-CM

## 2011-01-30 LAB — BASIC METABOLIC PANEL
BUN: 83 mg/dL — ABNORMAL HIGH (ref 6–23)
Chloride: 98 mEq/L (ref 96–112)
Potassium: 3.8 mEq/L (ref 3.5–5.3)
Sodium: 135 mEq/L (ref 135–145)

## 2011-01-30 NOTE — Assessment & Plan Note (Signed)
Blood pressure well-controlled on current regimen. We'll continue at this point may need to discontinue of Zaroxolyn in the setting of worsening renal function. Will refer to renal for further management.

## 2011-01-30 NOTE — Progress Notes (Signed)
Subjective:   Patient ID: Adam [name] male   DOB: February 05, 1965 46 y.o.   MRN: [id number]  HPI: [name] is a 46 y.o. male with past history significant as outlined below who presented to the clinic for a followup office weight gain. Patient was evaluated by Dr. Tenny Craw (Cardiology) on December 27 where he was noted to have weight gain from baseline. Dr. Tenny Craw would review the previous 2-D echo but also had concern that possibly sleep apnea is exacerbating the situation since patient has been not using CPAP (it was broken per patient). He was referred for sleep study and will be evaluated on January 17. There is a question if patient needs right heart catheter per Dr. Tenny Craw. A Bmet was obtained and showed creatinine continues to be elevated from 2.49 to 3. Patient was recommended to continue current regimen including torsemide 100 twice a day and was asked to d/c Zaroxolyn on December 27 which he did not. He was taking Metolazone 10 mg every other day. He has an appointment with his kidney doctor on January 9.  The patient noted that he had noticed that he feels a lot better. He had significant increase in urine output and noticed that his swelling in his legs has improved dramatically. He is now able to walk a lot better. He denies any chest pain shortness of breath, abdominal pain nausea or vomiting.    Past Medical History  Diagnosis Date  . Other and unspecified hyperlipidemia   . Insomnia, unspecified   . Obstructive sleep apnea (adult) (pediatric)   . Abdominal pain, left upper quadrant   . Allergic rhinitis, cause unspecified   . Other primary cardiomyopathies   . Unspecified essential hypertension   . Type II or unspecified type diabetes mellitus without mention of complication, not stated as uncontrolled   . Type II or unspecified type diabetes mellitus without mention of complication, not stated as uncontrolled   . Chronic kidney disease, stage I   . Esophageal reflux   . Morbid  obesity   . Dysuria   . Tobacco use disorder   . Dysrhythmia    Current Outpatient Prescriptions  Medication Sig Dispense Refill  . acetaminophen (TYLENOL) 325 MG tablet Take 650 mg by mouth every 6 (six) hours as needed.        . docusate sodium (COLACE) 100 MG capsule Take 100 mg by mouth 2 (two) times daily.        . ferrous sulfate 325 (65 FE) MG tablet Take 1 tablet (325 mg total) by mouth 3 (three) times daily with meals.  90 tablet  3  . fluticasone (FLONASE) 50 MCG/ACT nasal spray Place 2 sprays into the nose daily.  16 g  2  . insulin glargine (LANTUS SOLOSTAR) 100 UNIT/ML injection Inject 55 Units subcutaneously once before bedtime  10 mL  11  . LORazepam (ATIVAN) 0.5 MG tablet Take 0.5 mg by mouth 2 (two) times daily as needed.        . metoprolol (LOPRESSOR) 100 MG tablet Take 1 tablet (100 mg total) by mouth 2 (two) times daily.  62 tablet  11  . pantoprazole (PROTONIX) 40 MG tablet Take 1 tablet (40 mg total) by mouth daily at 12 noon.  30 tablet  3  . simvastatin (ZOCOR) 40 MG tablet Take 1 tablet (40 mg total) by mouth daily.  30 tablet  11  . spironolactone (ALDACTONE) 25 MG tablet Take 1 tablet (25 mg total) by mouth daily.  31  tablet  11  . tadalafil (CIALIS) 20 MG tablet Take 1 tablet (20 mg total) by mouth as needed.  10 tablet  5  . torsemide (DEMADEX) 100 MG tablet Take 100 mg by mouth 2 (two) times daily.        Marland Kitchen DISCONTD: LORazepam (ATIVAN) 0.5 MG tablet Take 1 tablet (0.5 mg total) by mouth 2 (two) times daily as needed (for nerves).  60 tablet  0   Review of Systems: Constitutional: Denies fever, chills, diaphoresis, appetite change and fatigue.   Respiratory: Denies SOB, DOE, cough, chest tightness,  and wheezing.   Cardiovascular: Denies chest pain, palpitations Gastrointestinal: Denies nausea, vomiting, abdominal pain, diarrhea, constipation, blood in stool and abdominal distention.  Genitourinary: Denies dysuria, , hematuria, flank pain and difficulty  urinating.    Objective:  Physical Exam: Filed Vitals:   01/30/11 0857  BP: 121/78  Pulse: 83  Temp: 97 F (36.1 C)  TempSrc: Oral  Height: 6' 0.5" (1.842 m)  Weight: 349 lb 14.4 oz (158.714 kg)  SpO2: 95%   Constitutional: Vital signs reviewed.  Patient is a well-developed and well-nourished  in no acute distress and cooperative with exam. Alert and oriented x3.  Neck: Supple,  Cardiovascular: RRR, S1 normal, S2 normal, no MRG, pulses symmetric and intact bilaterally Pulmonary/Chest: CTAB, no wheezes, rales, or rhonchi Abdominal: Soft. Non-tender, non-distended, bowel sounds are normal, Extremity: Trace edema bilaterally Neurological: A&O x3,  no focal motor deficit, sensory intact to light touch bilaterally.

## 2011-01-30 NOTE — Assessment & Plan Note (Signed)
Swelling has significantly improved. Patient's weight is down 11 pounds from 362 349 within 6 days. Patient is currently taking torsemide 100 mg twice a day and spironolactone 25 mg daily as well as Zaroxolyn  10 mg every other day instead of every day per Dr. Eliane Decree recommendation. Dr. Tenny Craw recommended to discontinue which he did not. I will recheck the Bmet  today and possibly also recommend to discontinue Zaroxolyn if worsening renal function is noted otherwise I will refer further management to nephrology.

## 2011-01-30 NOTE — Assessment & Plan Note (Signed)
Foot exam performed today. Diabetes was not addressed during this office visit. Needs hemoglobin A1c during next office visit. Patient reports that his blood sugars has been running in the 120s range before breakfast on a daily basis but he drank soda and ate some sweets this morning before he came to the office that is why his blood sugar today is elevated.

## 2011-02-10 ENCOUNTER — Telehealth: Payer: Self-pay | Admitting: *Deleted

## 2011-02-10 NOTE — Telephone Encounter (Signed)
Call from pt states that he is constipated from the Iron pills that he is taking.  Said that the [employer] is not working.  Said that his stool are also turning black.  Is passing gas.  Concerned since he has an Uncle with stomach Cancer.  Pt was told that I would contact his PCP and see what changes can be made for him.  Angelina Ok, RN 02/10/2011 10:01.

## 2011-02-12 ENCOUNTER — Telehealth: Payer: Self-pay | Admitting: Internal Medicine

## 2011-02-12 NOTE — Telephone Encounter (Signed)
error 

## 2011-02-13 ENCOUNTER — Institutional Professional Consult (permissible substitution): Payer: Medicaid Other | Admitting: Pulmonary Disease

## 2011-02-14 ENCOUNTER — Telehealth: Payer: Self-pay | Admitting: *Deleted

## 2011-02-14 NOTE — Telephone Encounter (Signed)
Pt called with c/o left knee pain.  Onset of pain 2 weeks ago. No known injury. Tylenol is not helping. Pt states he is going to a funeral tomorrow and needs something.  I explained to pt that he needs to be seen and examined to get meds.  We have  No appointments at this time, suggested [facility]. He is asking for something just to get through the week-end.  Hx GI bleed

## 2011-02-14 NOTE — Telephone Encounter (Signed)
Pt needs to go to [facility] today for evaluation.  Pt informed and [facility] appointment made for next week.

## 2011-02-20 ENCOUNTER — Encounter: Payer: Self-pay | Admitting: Internal Medicine

## 2011-02-20 ENCOUNTER — Ambulatory Visit (INDEPENDENT_AMBULATORY_CARE_PROVIDER_SITE_OTHER): Payer: Medicaid Other | Admitting: Internal Medicine

## 2011-02-20 VITALS — BP 114/77 | HR 94 | Temp 96.7°F | Ht 73.0 in | Wt 306.7 lb

## 2011-02-20 DIAGNOSIS — J302 Other seasonal allergic rhinitis: Secondary | ICD-10-CM

## 2011-02-20 DIAGNOSIS — K219 Gastro-esophageal reflux disease without esophagitis: Secondary | ICD-10-CM

## 2011-02-20 DIAGNOSIS — E119 Type 2 diabetes mellitus without complications: Secondary | ICD-10-CM

## 2011-02-20 DIAGNOSIS — M25569 Pain in unspecified knee: Secondary | ICD-10-CM

## 2011-02-20 DIAGNOSIS — E785 Hyperlipidemia, unspecified: Secondary | ICD-10-CM

## 2011-02-20 DIAGNOSIS — N529 Male erectile dysfunction, unspecified: Secondary | ICD-10-CM

## 2011-02-20 DIAGNOSIS — I83812 Varicose veins of left lower extremities with pain: Secondary | ICD-10-CM

## 2011-02-20 DIAGNOSIS — F419 Anxiety disorder, unspecified: Secondary | ICD-10-CM

## 2011-02-20 DIAGNOSIS — I10 Essential (primary) hypertension: Secondary | ICD-10-CM

## 2011-02-20 LAB — GLUCOSE, CAPILLARY: Glucose-Capillary: 436 mg/dL — ABNORMAL HIGH (ref 70–99)

## 2011-02-20 LAB — LIPID PANEL
Total CHOL/HDL Ratio: 3.4 Ratio
VLDL: 15 mg/dL (ref 0–40)

## 2011-02-20 LAB — BASIC METABOLIC PANEL
CO2: 23 mEq/L (ref 19–32)
Glucose, Bld: 397 mg/dL — ABNORMAL HIGH (ref 70–99)
Potassium: 4.2 mEq/L (ref 3.5–5.3)
Sodium: 130 mEq/L — ABNORMAL LOW (ref 135–145)

## 2011-02-20 MED ORDER — INSULIN GLARGINE 100 UNIT/ML ~~LOC~~ SOLN: SUBCUTANEOUS | Status: DC

## 2011-02-20 MED ORDER — DICLOFENAC SODIUM 1 % TD GEL: 1.0000 "application " | Freq: Four times a day (QID) | TRANSDERMAL | Status: DC

## 2011-02-20 MED ORDER — TRAMADOL HCL 50 MG PO TABS: 50.0000 mg | ORAL_TABLET | Freq: Two times a day (BID) | ORAL | Status: DC | PRN

## 2011-02-20 NOTE — Assessment & Plan Note (Signed)
Blood pressure well controlled on current regimen. It seems patient was significantly fluid overloaded and with starting Demadex and spironolactone patient has lost 43 pound  within 20 days. Patient reports that his lower extremity edema has significantly improved but he feels tired. I will obtain basic metabolic panel for possible changes in management.

## 2011-02-20 NOTE — Assessment & Plan Note (Signed)
With hemoglobin A1c of 11.1 significantly worsened since 3 months ago when it was 6.9. Patient reports that he has been not controlling his diet. He noted that his blood sugars has been elevated whenever he checks it but he did not bring his need with him. He's currently taking Lantus 55 units daily. I will increase it to 60 units. Recommended to control his sugars at least twice a day and to bring his glucometer during the next office visit. I will refer patient closer to our diabetic educator.

## 2011-02-20 NOTE — Progress Notes (Signed)
Subjective:   Patient ID: Adam [name] male   DOB: 1965/08/19 46 y.o.   MRN: [id number]  HPI: [name] is a 46 y.o. male with past medical history significant as outlined below who presented to the clinic with left knee pain. Patient reports that he had knee pain in the past but it is currently getting worse. He reports sometimes gives out on him therefore he bought a cane. He first noted pain radiating to his ankle again which is not new for him. Denies any injury, redness, fevers or chills. He is not sure if he feels some popping in his knees. The pain is mostly in the left knee but occasionally also reports chronic right knee pain.  Patient is noted to have 43 pound weight loss from 01/30/2011. The patient was started on torsemide and spironolactone by Dr. Tenny Craw (cardiology).     Past Medical History  Diagnosis Date  . Other and unspecified hyperlipidemia   . Insomnia, unspecified   . Obstructive sleep apnea (adult) (pediatric)   . Abdominal pain, left upper quadrant   . Allergic rhinitis, cause unspecified   . Other primary cardiomyopathies   . Unspecified essential hypertension   . Type II or unspecified type diabetes mellitus without mention of complication, not stated as uncontrolled   . Type II or unspecified type diabetes mellitus without mention of complication, not stated as uncontrolled   . Chronic kidney disease, stage I   . Esophageal reflux   . Morbid obesity   . Dysuria   . Tobacco use disorder   . Dysrhythmia    Current Outpatient Prescriptions  Medication Sig Dispense Refill  . acetaminophen (TYLENOL) 325 MG tablet Take 650 mg by mouth every 6 (six) hours as needed.        . docusate sodium (COLACE) 100 MG capsule Take 100 mg by mouth 2 (two) times daily.        . ferrous sulfate 325 (65 FE) MG tablet Take 1 tablet (325 mg total) by mouth 3 (three) times daily with meals.  90 tablet  3  . fluticasone (FLONASE) 50 MCG/ACT nasal spray Place 2 sprays into the nose  daily.  16 g  2  . insulin glargine (LANTUS SOLOSTAR) 100 UNIT/ML injection Inject 55 Units subcutaneously once before bedtime  10 mL  11  . LORazepam (ATIVAN) 0.5 MG tablet Take 0.5 mg by mouth 2 (two) times daily as needed.        . metolazone (ZAROXOLYN) 10 MG tablet Take 10 mg by mouth daily. Prescribed by Dr Allena Katz. Has been taking one every other day.       . metoprolol (LOPRESSOR) 100 MG tablet Take 1 tablet (100 mg total) by mouth 2 (two) times daily.  62 tablet  11  . pantoprazole (PROTONIX) 40 MG tablet Take 1 tablet (40 mg total) by mouth daily at 12 noon.  30 tablet  3  . simvastatin (ZOCOR) 40 MG tablet Take 1 tablet (40 mg total) by mouth daily.  30 tablet  11  . spironolactone (ALDACTONE) 25 MG tablet Take 1 tablet (25 mg total) by mouth daily.  31 tablet  11  . torsemide (DEMADEX) 100 MG tablet Take 100 mg by mouth 2 (two) times daily.        Marland Kitchen DISCONTD: LORazepam (ATIVAN) 0.5 MG tablet Take 1 tablet (0.5 mg total) by mouth 2 (two) times daily as needed (for nerves).  60 tablet  0   Family History  Problem Relation Age  of Onset  . Diabetes Mother   . Microcephaly Father    History   Social History  . Marital Status: Single    Spouse Name: N/A    Number of Children: N/A  . Years of Education: N/A   Social History Main Topics  . Smoking status: Current Everyday Smoker -- 0.2 packs/day    Types: Cigarettes  . Smokeless tobacco: Never Used   Comment: Will stop on his own - cutting back now  . Alcohol Use: No  . Drug Use: No  . Sexually Active: Yes   Other Topics Concern  . None   Social History Narrative  . None   Review of Systems: Constitutional: Denies fever, chills, diaphoresis noted decreased appetite  and fatigue.    Respiratory: Denies SOB, DOE, cough, chest tightness,  and wheezing.   Cardiovascular: Denies chest pain, palpitations and leg swelling.  Gastrointestinal: Denies nausea, vomiting, abdominal pain, diarrhea, constipation, blood in stool and  abdominal distention.  Genitourinary: Denies dysuria, urgency, frequency, hematuria, flank pain and difficulty urinating.  Musculoskeletal: Noted myalgias, back pain, joint swelling, arthralgias and gait problem.  Skin: Denies pallor, rash and wound.  Neurological: Denies dizziness, numbness and headaches.    Objective:  Physical Exam: Filed Vitals:   02/20/11 1106  BP: 114/77  Pulse: 94  Temp: 96.7 F (35.9 C)  TempSrc: Oral  Height: 6\' 1"  (1.854 m)  Weight: 306 lb 11.2 oz (139.118 kg)   Constitutional: Vital signs reviewed.  Patient is a well-developed and well-nourished  in no acute distress and cooperative with exam. Alert and oriented x3.  Neck: Supple,  Cardiovascular: RRR, S1 normal, S2 normal, no MRG, pulses symmetric and intact bilaterally Pulmonary/Chest: CTAB, no wheezes, rales, or rhonchi Abdominal: Soft. Non-tender, non-distended, bowel sounds are normal,  Musculoskeletal: Left Knee: Swelling, decreased range of motion due to pain, tenderness to palpation on the lateral and medial aspect of the knee. No crepitus. No stiffness. No erythema. Right Knee:  No joint deformities, erythema, or stiffness, ROM full and no nontender Neurological: A&O x3, Strenght is normal and symmetric bilaterally,  sensory intact to light touch bilaterally.  Skin: Warm, dry and intact. No rash, cyanosis, or clubbing.

## 2011-02-20 NOTE — Assessment & Plan Note (Addendum)
Due to history of gastric ulcers patient can not be given any NSAIDs. Tylenol has not improved his pain. I will prescribed tramadol for the acute pain. Furthermore I will refer patient to sports medicine for possible steroid injection and physical therapy. Patient was initially reluctant to do physical therapy and wants to stick with pain medication I noted that we will not be able to give any pain medication in the future if we did not had a trial of physical therapy. Patient was willing to go.

## 2011-02-20 NOTE — Patient Instructions (Addendum)
1. Take tramadol twice a day as needed for joint knee pain. You can use the gel 4 times a day one year knee 2. Increase her Lantus to 60 units at bedtime. 3.Please check your blood sugars twice a day once before breakfast and once before bedtime. Bring in the needed during the next office visit.

## 2011-02-20 NOTE — Assessment & Plan Note (Signed)
I will obtain lipid panel today for possible management since last evaluated in May 2011.

## 2011-02-25 ENCOUNTER — Encounter: Payer: Self-pay | Admitting: Family Medicine

## 2011-02-25 ENCOUNTER — Ambulatory Visit (INDEPENDENT_AMBULATORY_CARE_PROVIDER_SITE_OTHER): Payer: Medicaid Other | Admitting: Family Medicine

## 2011-02-25 VITALS — BP 126/86 | HR 88 | Ht 73.0 in | Wt 306.0 lb

## 2011-02-25 DIAGNOSIS — M25569 Pain in unspecified knee: Secondary | ICD-10-CM

## 2011-02-25 DIAGNOSIS — M1712 Unilateral primary osteoarthritis, left knee: Secondary | ICD-10-CM

## 2011-02-25 DIAGNOSIS — M1710 Unilateral primary osteoarthritis, unspecified knee: Secondary | ICD-10-CM

## 2011-02-25 DIAGNOSIS — M25562 Pain in left knee: Secondary | ICD-10-CM

## 2011-02-25 NOTE — Progress Notes (Addendum)
  Patient Name: Adam [name] Date of Birth: 1965/07/28 Age: 46 y.o. Medical Record Number: [id number] Gender: male Date of Encounter: 02/25/2011  History of Present Illness:  Adam [name] is a 46 y.o. very pleasant male patient who presents with the following:  Patient presents with 3 mo h/o L sided knee pain after no injury - more lateral. No audible pop was heard. The patient has had an effusion. No symptomatic giving-way. No mechanical clicking. Joint has not locked up. Patient has been able to walk but is limping. The patient does not have pain going up and down stairs or rising from a seated position.   Pain location: diffuse, some lateral Current physical activity: none Prior Knee Surgery: none at left knee Current pain meds: tramadol, voltaren gel Bracing: none Occupation or school level: Education administrator   Past Medical History, Surgical History, Social History, Family History, Problem List, Medications, and Allergies have been reviewed and updated if relevant.  Review of Systems:  GEN: No fevers, chills. Nontoxic. Primarily MSK c/o today. MSK: Detailed in the HPI GI: tolerating PO intake without difficulty Neuro: No numbness, parasthesias, or tingling associated. Otherwise the pertinent positives of the ROS are noted above.   Last a1c 11  Physical Examination: Filed Vitals:   02/25/11 1331  BP: 126/86  Pulse: 88  Height: 6\' 1"  (1.854 m)  Weight: 306 lb (138.801 kg)    Body mass index is 40.37 kg/(m^2).   GEN: WDWN, NAD, Non-toxic, Alert & Oriented x 3 HEENT: Atraumatic, Normocephalic.  Ears and Nose: No external deformity. EXTR: No clubbing/cyanosis/edema NEURO: Normal gait.  PSYCH: Normally interactive. Conversant. Not depressed or anxious appearing.  Calm demeanor.   Knee:  l Gait: Normal heel toe pattern, mild antalgia ROM: 0-110 Effusion: mild-mod Echymosis or edema: none Patellar tendon NT Painful PLICA: neg Patellar grind: negative Medial and lateral patellar  facet loading: negative medial and lateral joint lines:NT Mcmurray's neg Flexion-pinch pos Varus and valgus stress: stable Lachman: neg Ant and Post drawer: neg Hip abduction, IR, ER: WNL Hip flexion str: 5/5 Hip abd: 5/5 Quad: 5/5 VMO atrophy: mild Hamstring concentric and eccentric: 5/5   Assessment and Plan: 1. Left knee pain   2. Osteoarthritis of left knee     Probable OA flare, cannot rule out deg meniscal pathology Conservative management for now, cont tylenol, voltaren gel, ultram  Knee Injection, LEFT Patient verbally consented to procedure. Risks (including potential rare risk of infection), benefits, and alternatives explained. Sterilely prepped with Chloraprep. Ethyl cholride used for anesthesia. 9 cc Lidocaine 1% mixed with 1 cc of Depo-Medrol 40 mg injected using the anterolateral approach without difficulty. No complications with procedure and tolerated well. Patient had decreased pain post-injection.

## 2011-02-27 ENCOUNTER — Ambulatory Visit (INDEPENDENT_AMBULATORY_CARE_PROVIDER_SITE_OTHER): Payer: Medicaid Other | Admitting: Pulmonary Disease

## 2011-02-27 ENCOUNTER — Encounter: Payer: Self-pay | Admitting: Pulmonary Disease

## 2011-02-27 VITALS — BP 108/70 | HR 61 | Temp 97.9°F | Ht 73.0 in | Wt 306.0 lb

## 2011-02-27 DIAGNOSIS — G4733 Obstructive sleep apnea (adult) (pediatric): Secondary | ICD-10-CM

## 2011-02-27 NOTE — Telephone Encounter (Signed)
Pt has an appointment to see PCP.  Angelina Ok, RN 02/27/2011 12:03 PM

## 2011-02-27 NOTE — Assessment & Plan Note (Signed)
The patient has a history of severe obstructive sleep apnea, but has lost 50 pounds since that time.  He has not used CPAP in the last one year, and feels that he no longer has sleep apnea.  He feels that he sleeps well, and denies any sleepiness during the day.  It is unclear to me whether he still has sleep apnea or not, but I have explained to him with his underlying cardiac disease that we have to be sure that we are treating him properly.  I would recommend a followup sleep study to see if he still has sleep apnea.  If he does, will arrange for a new CPAP device.  I have encouraged him to continue working on weight reduction.

## 2011-02-27 NOTE — Patient Instructions (Signed)
Will set up for a repeat sleep study to see if you still have sleep apnea after your weight loss Will call you once the results are available.

## 2011-02-27 NOTE — Progress Notes (Signed)
  Subjective:    Patient ID: Erasmus Bistline, male    DOB: 04-Oct-1965, 46 y.o.   MRN: [id number]  HPI The patient is a 46 year old male who comes in today for reevaluation of his obstructive sleep apnea.  He was first diagnosed in 2006 with severe sleep apnea, and was started on CPAP with good tolerance.  He has been using his CPAP regularly and doing well until approximately one year ago when it quit functioning.  He has not used CPAP since, and his weight is down 50 pounds from his last evaluation here in 2006.  The patient does not believe that he snores, and feels that he sleeps through the night quite well.  He feels rested in the mornings upon arising, and denies any inappropriate daytime sleepiness with periods of inactivity.  He is able to watch television and movies in the evenings without getting sleepy, and denies any sleepiness with driving.  His Epworth sleepiness score today is zero.  Sleep Questionnaire: What time do you typically go to bed?( Between what hours) 11 pm How long does it take you to fall asleep? 1 hour How many times during the night do you wake up? 1 What time do you get out of bed to start your day? 0700 Do you drive or operate heavy machinery in your occupation? No How much has your weight changed (up or down) over the past two years? (In pounds) 80 lb (36.288 kg) Have you ever had a sleep study before? Yes If yes, location of study? If yes, date of study? Do you currently use CPAP? No Do you wear oxygen at any time? No    Review of Systems  Constitutional: Positive for unexpected weight change. Negative for fever.  HENT: Positive for sneezing and postnasal drip. Negative for ear pain, nosebleeds, congestion, sore throat, rhinorrhea, trouble swallowing, dental problem and sinus pressure.   Eyes: Negative for redness and itching.  Respiratory: Negative for cough, chest tightness, shortness of breath and wheezing.   Cardiovascular: Negative for palpitations and leg swelling.    Gastrointestinal: Negative for nausea and vomiting.  Genitourinary: Negative for dysuria.  Musculoskeletal: Negative for joint swelling.  Skin: Negative for rash.  Neurological: Negative for headaches.  Hematological: Does not bruise/bleed easily.  Psychiatric/Behavioral: Negative for dysphoric mood. The patient is not nervous/anxious.        Objective:   Physical Exam Constitutional:  Obese male, no acute distress  HENT:  Nares patent without discharge, +turbinate hypertrophy  Oropharynx without exudate, palate and uvula are thick and elongated.   Eyes:  Perrla, eomi, no scleral icterus  Neck:  No JVD, no TMG  Cardiovascular:  Normal rate, regular rhythm, no rubs or gallops.  No murmurs        Intact distal pulses but decreased.  Pulmonary :  Normal breath sounds, no stridor or respiratory distress   No rales, rhonchi, or wheezing  Abdominal:  Soft, nondistended, bowel sounds present.  No tenderness noted.   Musculoskeletal:  1+ lower extremity edema noted.  Lymph Nodes:  No cervical lymphadenopathy noted  Skin:  No cyanosis noted  Neurologic:  Alert, appropriate, moves all 4 extremities without obvious deficit.         Assessment & Plan:

## 2011-03-04 ENCOUNTER — Ambulatory Visit (INDEPENDENT_AMBULATORY_CARE_PROVIDER_SITE_OTHER): Payer: Medicaid Other | Admitting: Internal Medicine

## 2011-03-04 ENCOUNTER — Ambulatory Visit: Payer: Medicaid Other | Admitting: Dietician

## 2011-03-04 ENCOUNTER — Encounter: Payer: Self-pay | Admitting: Internal Medicine

## 2011-03-04 DIAGNOSIS — E785 Hyperlipidemia, unspecified: Secondary | ICD-10-CM

## 2011-03-04 DIAGNOSIS — I83812 Varicose veins of left lower extremities with pain: Secondary | ICD-10-CM

## 2011-03-04 DIAGNOSIS — E119 Type 2 diabetes mellitus without complications: Secondary | ICD-10-CM

## 2011-03-04 DIAGNOSIS — I509 Heart failure, unspecified: Secondary | ICD-10-CM

## 2011-03-04 DIAGNOSIS — I10 Essential (primary) hypertension: Secondary | ICD-10-CM

## 2011-03-04 DIAGNOSIS — K219 Gastro-esophageal reflux disease without esophagitis: Secondary | ICD-10-CM

## 2011-03-04 DIAGNOSIS — J302 Other seasonal allergic rhinitis: Secondary | ICD-10-CM

## 2011-03-04 DIAGNOSIS — N529 Male erectile dysfunction, unspecified: Secondary | ICD-10-CM

## 2011-03-04 DIAGNOSIS — F419 Anxiety disorder, unspecified: Secondary | ICD-10-CM

## 2011-03-04 LAB — GLUCOSE, CAPILLARY: Glucose-Capillary: 474 mg/dL — ABNORMAL HIGH (ref 70–99)

## 2011-03-04 MED ORDER — INSULIN GLARGINE 100 UNIT/ML ~~LOC~~ SOLN: SUBCUTANEOUS | Status: DC

## 2011-03-04 NOTE — Assessment & Plan Note (Signed)
At goal for his CAD risk factors

## 2011-03-04 NOTE — Progress Notes (Signed)
Patient ID: Adam [name], male   DOB: 15-May-1965, 46 y.o.   MRN: [id number]   46 year old man with past medical history of diabetes, chronic kidney disease, history of arrhythmia with a defibrillator, cardiomyopathy with EF of 35%, hyperlipidemia, history of GI bleed from her did not also in December 2012 and chronic pain from arthritis comes to the clinic for followup. He has a nephrologist for his chronic kidney disease, cardiologist for his heart condition and a gastroenterologist for his GI bleed. He follows up in the outpatient clinic for diabetes and primary care. He does not have any new complaints today. He stated that his CBGs have been running high in the past few weeks after he received steroid injection in his left knee.  He checks his CBGs at home and says there have not been any hypoglycemic episode Compliant with his medications.  Physical exam   General Appearance:     Filed Vitals:   03/04/11 0818  BP: 128/77  Pulse: 75  Temp: 96.9 F (36.1 C)  TempSrc: Oral  Height: 6\' 1"  (1.854 m)  Weight: 301 lb 8 oz (136.76 kg)  SpO2: 95%     Alert, cooperative, no distress, appears stated age  Head:    Normocephalic, without obvious abnormality, atraumatic  Eyes:    PERRL, conjunctiva/corneas clear, EOM's intact, fundi    benign, both eyes       Neck:   Supple, symmetrical, trachea midline, no adenopathy;       thyroid:  No enlargement/tenderness/nodules; no carotid   bruit or JVD  Lungs:     Clear to auscultation bilaterally, respirations unlabored  Chest wall:    No tenderness or deformity  Heart:    Regular rate and rhythm, S1 and S2 normal, no murmur, rub   or gallop  Abdomen:     Soft, non-tender, bowel sounds active all four quadrants,    no masses, no organomegaly  Extremities:   Extremities normal, atraumatic, no cyanosis or edema  Pulses:   2+ and symmetric all extremities  Skin:   Skin color, texture, turgor normal, no rashes or lesions  Neurologic:  nonfocal  grossly    Review of system  Constitutional: Denies fever, chills, diaphoresis, appetite change and fatigue.  HEENT: Denies photophobia, eye pain, redness, hearing loss, ear pain, congestion, sore throat, rhinorrhea, sneezing, mouth sores, trouble swallowing, neck pain, neck stiffness and tinnitus.   Respiratory: Denies SOB, DOE, cough, chest tightness,  and wheezing.   Cardiovascular: Denies chest pain, palpitations and leg swelling.  Gastrointestinal: Denies nausea, vomiting, abdominal pain, diarrhea, constipation, blood in stool and abdominal distention.  Genitourinary: Denies dysuria, urgency, frequency, hematuria, flank pain and difficulty urinating.  Musculoskeletal: Denies myalgias, back pain, joint swelling, arthralgias and gait problem.  Skin: Denies pallor, rash and wound.  Neurological: Denies dizziness, seizures, syncope, weakness, light-headedness, numbness and headaches.  Hematological: Denies adenopathy. Easy bruising, personal or family bleeding history  Psychiatric/Behavioral: Denies suicidal ideation, mood changes, confusion, nervousness, sleep disturbance and agitation

## 2011-03-04 NOTE — Assessment & Plan Note (Signed)
Continue diuretics as prescribed by his cardiologist.

## 2011-03-04 NOTE — Patient Instructions (Signed)
Schedule a follow up appointment in April 2013 with me Take 65 units of lantus every night We may need to start Novolog in addition to lantus at next visit for better diabetes control Bring you meter and all the medications with you  Schedule an appointment with Tobey Bride for Diabetes education

## 2011-03-04 NOTE — Assessment & Plan Note (Signed)
He has been self titrating his Lantus from 55-65 units each bedtime he did have asked him to continue at 65 units for now. We may need to start him on NovoLog. He has refused that today. His discuss this again with him at next visit in April. I've also asked him to see Tobey Bride. for diabetes and insulin education. Have strongly emphasized lifestyle modification. He has promised that he is going to watch his diet and exercise as much as she can for next 2 months. Will hold off on starting NovoLog until next visit. Check creatinine at next visit as well. I've asked him to bring his meter and all his medications

## 2011-03-05 ENCOUNTER — Telehealth: Payer: Self-pay | Admitting: *Deleted

## 2011-03-05 NOTE — Telephone Encounter (Signed)
agree

## 2011-03-05 NOTE — Telephone Encounter (Signed)
Pt called stating the steroid injection was very helpful for knee pain, but now L ankle is hurting.  Denies an injury to ankle. States it is painful to bear weight, feels like he needs to pop his ankle.  He states he cannot take NSAIDs 2/2 a bleeding ulcer.  Advised him to take tylenol every 6 hours today, use ice, and voltaren gel (he already had this at home)- asked him to call back tomorrow morning if no better, and we will work him in.  Pt agreeable.

## 2011-03-05 NOTE — Progress Notes (Signed)
Addended by: Neomia Dear on: 03/05/2011 05:37 PM   Modules accepted: Orders

## 2011-03-18 ENCOUNTER — Ambulatory Visit (INDEPENDENT_AMBULATORY_CARE_PROVIDER_SITE_OTHER): Payer: Medicaid Other | Admitting: Family Medicine

## 2011-03-18 DIAGNOSIS — M25569 Pain in unspecified knee: Secondary | ICD-10-CM

## 2011-03-18 DIAGNOSIS — M25572 Pain in left ankle and joints of left foot: Secondary | ICD-10-CM

## 2011-03-18 DIAGNOSIS — M25579 Pain in unspecified ankle and joints of unspecified foot: Secondary | ICD-10-CM

## 2011-03-18 NOTE — Progress Notes (Signed)
  Patient Name: Adam [name] Date of Birth: 1965-09-18 Age: 46 y.o. Medical Record Number: [id number] Gender: male Date of Encounter: 03/18/2011  History of Present Illness:  Adam [name] is a 46 y.o. very pleasant male patient who presents with the following:  F/u OV from 3 weeks ago with knee pain, L, that I thought prob OA flare, s/p intraarticular injection. Has done well. Knee pain is gone now.  L ankle pain now, no known injury. Occ puffy with diffuse varicosities throughout LE that sometimes get more prominent when hurting.  Used to weigh around 375, now 300 pounds  Past Medical History, Surgical History, Social History, Family History, Problem List, Medications, and Allergies have been reviewed and updated if relevant.  Review of Systems:  GEN: No fevers, chills. Nontoxic. Primarily MSK c/o today. MSK: Detailed in the HPI GI: tolerating PO intake without difficulty Neuro: No numbness, parasthesias, or tingling associated. Otherwise the pertinent positives of the ROS are noted above.    Physical Examination: Filed Vitals:   03/18/11 1338  BP: 135/85    There is no height or weight on file to calculate BMI.   GEN: WDWN, NAD, Non-toxic, Alert & Oriented x 3 HEENT: Atraumatic, Normocephalic.  Ears and Nose: No external deformity. EXTR: No clubbing/cyanosis/edema NEURO: Normal gait.  PSYCH: Normally interactive. Conversant. Not depressed or anxious appearing.  Calm demeanor.   Knee: 0-125. No eff. Stable MCL, LCL, ACL, PCL. Neg mcmurrays and flexion pinch.  Ankle -- mild ankle effusion, L, NT o/w throughout bony anatomy. NT achilles, PF, PT, peroneals. Neg ant drawer.  Assessment and Plan: 1. Knee pain   2. Left ankle pain     Knee pain, resolved  l ankle pain, most likely OA flare secondary to altered gait from prior knee pain. Voltaren gel for the next 1-2 weeks QID.  Ice bucket at night. Can cont this pattern if gets a flare long-term  F/u prn

## 2011-03-19 ENCOUNTER — Ambulatory Visit (HOSPITAL_BASED_OUTPATIENT_CLINIC_OR_DEPARTMENT_OTHER): Payer: Medicaid Other | Attending: Pulmonary Disease | Admitting: Radiology

## 2011-03-19 VITALS — Ht 73.0 in | Wt 306.0 lb

## 2011-03-19 DIAGNOSIS — G4737 Central sleep apnea in conditions classified elsewhere: Secondary | ICD-10-CM | POA: Insufficient documentation

## 2011-03-19 DIAGNOSIS — G4733 Obstructive sleep apnea (adult) (pediatric): Secondary | ICD-10-CM | POA: Insufficient documentation

## 2011-03-20 ENCOUNTER — Ambulatory Visit: Payer: Medicaid Other | Admitting: Internal Medicine

## 2011-03-20 ENCOUNTER — Ambulatory Visit: Payer: Medicaid Other | Admitting: Dietician

## 2011-04-07 NOTE — Progress Notes (Signed)
Addended by: Neomia Dear on: 04/07/2011 07:39 PM   Modules accepted: Orders

## 2011-04-08 DIAGNOSIS — G4737 Central sleep apnea in conditions classified elsewhere: Secondary | ICD-10-CM

## 2011-04-08 DIAGNOSIS — G4733 Obstructive sleep apnea (adult) (pediatric): Secondary | ICD-10-CM

## 2011-04-09 NOTE — Procedures (Signed)
Adam [name], Adam [name]                  ACCOUNT NO.:  [id number]  MEDICAL RECORD NO.:  [id number]          PATIENT TYPE:  OUT  LOCATION:  SLEEP CENTER                 FACILITY:  [facility]  PHYSICIAN:  Barbaraann Share, MD,FCCPDATE OF BIRTH:  12/16/64  DATE OF STUDY:  03/19/2011                           NOCTURNAL POLYSOMNOGRAM  REFERRING PHYSICIAN:  Barbaraann Share, MD,FCCP  REFERRING PHYSICIAN:  Barbaraann Share, MD,FCCP  INDICATION FOR STUDY:  Hypersomnia with sleep apnea.  EPWORTH SLEEPINESS SCORE:  5.  SLEEP ARCHITECTURE:  The patient had a total sleep time of 326 minutes with no slow-wave sleep and only 19 minutes of REM.  Sleep onset latency was normal at 20 minutes and REM onset was at the upper limits of normal.  Sleep efficiency was poor at 64%.  RESPIRATORY DATA:  The patient was found to have 94 apneas and 141 obstructive hypopneas, giving him an apnea/hypopnea index of 61 events per hour.  The events occurred in all body positions and there was moderate snoring noted throughout.  OXYGEN DATA:  There was O2 desaturation as low as 84% with the patient's obstructive events.  CARDIAC DATA:  Occasional PVC noted.  MOVEMENT/PARASOMNIA:  The patient had no significant leg jerks or other abnormal behaviors noted.  IMPRESSION/RECOMMENDATION: 1. Severe obstructive and central sleep apnea with an AHI of 61 events     per hour and O2 desaturation as low as 84%.  Treatment for this     degree of sleep apnea should focus primarily on CPAP as well as     weight loss. 2. Occasional premature ventricular contraction noted, but no     clinically significant arrhythmias were seen.     Barbaraann Share, MD,FCCP Diplomate, American Board of Sleep Medicine    [facility]  D:  04/08/2011 08:31:44  T:  04/09/2011 00:56:16  Job:  [id number]

## 2011-04-10 ENCOUNTER — Other Ambulatory Visit: Payer: Self-pay | Admitting: Pulmonary Disease

## 2011-04-10 ENCOUNTER — Telehealth: Payer: Self-pay | Admitting: Pulmonary Disease

## 2011-04-10 DIAGNOSIS — G4733 Obstructive sleep apnea (adult) (pediatric): Secondary | ICD-10-CM

## 2011-04-10 NOTE — Telephone Encounter (Signed)
Pt needs an ov with me in 6mos.

## 2011-04-10 NOTE — Telephone Encounter (Signed)
6 month f/u appt made for 10/17/11 at 9 am

## 2011-04-21 ENCOUNTER — Ambulatory Visit (INDEPENDENT_AMBULATORY_CARE_PROVIDER_SITE_OTHER): Payer: Medicaid Other | Admitting: Internal Medicine

## 2011-04-21 ENCOUNTER — Encounter: Payer: Self-pay | Admitting: Internal Medicine

## 2011-04-21 DIAGNOSIS — I10 Essential (primary) hypertension: Secondary | ICD-10-CM

## 2011-04-21 DIAGNOSIS — E119 Type 2 diabetes mellitus without complications: Secondary | ICD-10-CM

## 2011-04-21 DIAGNOSIS — I428 Other cardiomyopathies: Secondary | ICD-10-CM

## 2011-04-21 DIAGNOSIS — N289 Disorder of kidney and ureter, unspecified: Secondary | ICD-10-CM

## 2011-04-21 DIAGNOSIS — E785 Hyperlipidemia, unspecified: Secondary | ICD-10-CM

## 2011-04-21 NOTE — Progress Notes (Signed)
HPI  Patient is a 46 year old with a history of NICM (LVEF 30%), HTN, CRI.  I saw him in December.    Since seen he is back on CPAP. Feeling good. Off metformin now.  Renal function better but glucose is not good.  Runnning high  Patient is frustrated about this.  Upset that primary physician said he  Denies CP.  Breating is OK.  Ankles good.  Lipids in January LDL was 56, HDL was 29. No Known Allergies  Current Outpatient Prescriptions  Medication Sig Dispense Refill  . diclofenac sodium (VOLTAREN) 1 % GEL Apply 1 application topically 4 (four) times daily.  1 Tube  1  . docusate sodium (COLACE) 100 MG capsule Take 100 mg by mouth daily.       . ferrous sulfate 325 (65 FE) MG tablet Take 325 mg by mouth daily.       . fluticasone (FLONASE) 50 MCG/ACT nasal spray Place 1 spray into the nose daily as needed.       . insulin glargine (LANTUS SOLOSTAR) 100 UNIT/ML injection Inject 65 Units subcutaneously once before bedtime  10 mL  2  . LORazepam (ATIVAN) 0.5 MG tablet Take 0.5 mg by mouth 2 (two) times daily as needed.        . metolazone (ZAROXOLYN) 10 MG tablet Take 10 mg by mouth daily. Prescribed by Dr Allena Katz. Has been taking one every other day.       . metoprolol (LOPRESSOR) 100 MG tablet Take 1 tablet (100 mg total) by mouth 2 (two) times daily.  62 tablet  11  . pantoprazole (PROTONIX) 40 MG tablet Take 1 tablet (40 mg total) by mouth daily at 12 noon.  30 tablet  3  . simvastatin (ZOCOR) 40 MG tablet Take 1 tablet (40 mg total) by mouth daily.  30 tablet  11  . spironolactone (ALDACTONE) 25 MG tablet Take 1 tablet (25 mg total) by mouth daily.  31 tablet  11  . torsemide (DEMADEX) 100 MG tablet Take 100 mg by mouth 2 (two) times daily.        Marland Kitchen acetaminophen (TYLENOL) 325 MG tablet Take 650 mg by mouth every 6 (six) hours as needed.        . traMADol (ULTRAM) 50 MG tablet Take 1 tablet (50 mg total) by mouth 2 (two) times daily as needed for pain.  30 tablet  0  . DISCONTD:  LORazepam (ATIVAN) 0.5 MG tablet Take 1 tablet (0.5 mg total) by mouth 2 (two) times daily as needed (for nerves).  60 tablet  0    Past Medical History  Diagnosis Date  . Other and unspecified hyperlipidemia   . Insomnia, unspecified   . Obstructive sleep apnea (adult) (pediatric)   . Abdominal pain, left upper quadrant   . Allergic rhinitis, cause unspecified   . Other primary cardiomyopathies   . Unspecified essential hypertension   . Type II or unspecified type diabetes mellitus without mention of complication, not stated as uncontrolled   . Type II or unspecified type diabetes mellitus without mention of complication, not stated as uncontrolled   . Chronic kidney disease, stage I   . Esophageal reflux   . Morbid obesity   . Dysuria   . Tobacco use disorder   . Dysrhythmia     Past Surgical History  Procedure Date  . Cardiac defibrillator placement 2011  . Esophagogastroduodenoscopy 01/03/2011    Procedure: ESOPHAGOGASTRODUODENOSCOPY (EGD);  Surgeon: Vertell Novak.,  MD;  Location: [facility];  Service: Endoscopy;  Laterality: N/A;    Family History  Problem Relation Age of Onset  . Diabetes Mother   . Microcephaly Father   . Lung cancer Father     History   Social History  . Marital Status: Single    Spouse Name: N/A    Number of Children: N/A  . Years of Education: N/A   Occupational History  . Not on file.   Social History Main Topics  . Smoking status: Current Everyday Smoker -- 0.4 packs/day    Types: Cigarettes  . Smokeless tobacco: Never Used   Comment: started smoking at age 7. Will stop on his own - cutting back now  . Alcohol Use: No  . Drug Use: No  . Sexually Active: Yes   Other Topics Concern  . Not on file   Social History Narrative  . No narrative on file    Review of Systems:  All systems reviewed.  They are negative to the above problem except as previously stated.  Vital Signs: BP 141/76  Pulse 86  Resp 16  Ht 6\' 1"   (1.854 m)  Wt 306 lb 12 oz (139.141 kg)  BMI 40.47 kg/m2  Physical Exam Patient is in NAD HEENT:  Normocephalic, atraumatic. EOMI, PERRLA.  Neck: JVP is normal. No thyromegaly. No bruits.  Lungs: clear to auscultation. No rales no wheezes.  Heart: Regular rate and rhythm. Normal S1, S2. No S3.   No significant murmurs. PMI not displaced.  Abdomen:  Supple, nontender. Normal bowel sounds. No masses. No hepatomegaly.  Extremities:   Good distal pulses throughout. No lower extremity edema.  Musculoskeletal :moving all extremities.  Neuro:   alert and oriented x3.  CN II-XII grossly intact.  EKG:  SR.  84.  PACs  First degree AV block.  Poss anteirorlateral MI.     Assessment and Plan:

## 2011-04-21 NOTE — Patient Instructions (Signed)
Your physician wants you to follow-up in August  You will receive a reminder letter in the mail two months in advance. If you don't receive a letter, please call our office to schedule the follow-up appointment.  

## 2011-04-27 NOTE — Assessment & Plan Note (Signed)
Follows up with A. Powell.

## 2011-04-27 NOTE — Assessment & Plan Note (Signed)
Taken off of glucophage.  On insulin.  Discussion for regular insulin  Patient is upset.  WOuld like to review with endocrinology.

## 2011-04-27 NOTE — Assessment & Plan Note (Signed)
Volume status looks good.  I would keep him on same regimen.  Will need to follow renal function.

## 2011-04-27 NOTE — Assessment & Plan Note (Signed)
LDL is excellent.  Stay active.

## 2011-04-27 NOTE — Assessment & Plan Note (Signed)
Continue meds. 

## 2011-04-28 ENCOUNTER — Telehealth: Payer: Self-pay | Admitting: *Deleted

## 2011-04-28 NOTE — Telephone Encounter (Signed)
CALLED DR BALAN'S OFFICE TO SCHEDULE CONSULT  PER DR ROSS . NEW PT COORDINATOR  FOR [name]  NEEDS   LAST OFFICE NOTE AND  LABS  FAXED TO REVIEW  WITH [name] BEFORE  APPT  CAN BE  MADE  APPROPRIATE  DOCUMENTATION  FAXED  TO OFFICE PT AWARE TO  EXPECT CALL   WITHIN WEEK  WITH APPT IF NOT  WILL  CALL BACK TO LET us KNOW ./CY

## 2011-04-29 ENCOUNTER — Other Ambulatory Visit: Payer: Self-pay | Admitting: Internal Medicine

## 2011-05-06 ENCOUNTER — Telehealth: Payer: Self-pay | Admitting: Internal Medicine

## 2011-05-06 NOTE — Telephone Encounter (Signed)
LM AT DR Gilman Schmidt OFFICE FOR RETURN CALL PT HAS NOT HEARD ANYTHING RE APPT .Zack Seal

## 2011-05-06 NOTE — Telephone Encounter (Signed)
Pt's neighbor calling for him to find out which diabetic dr pt being referred to by dr Tenny Craw because he hasn't heard anything back from [place] at to who it is

## 2011-05-07 ENCOUNTER — Other Ambulatory Visit: Payer: Self-pay | Admitting: *Deleted

## 2011-05-07 DIAGNOSIS — E119 Type 2 diabetes mellitus without complications: Secondary | ICD-10-CM

## 2011-05-07 DIAGNOSIS — F419 Anxiety disorder, unspecified: Secondary | ICD-10-CM

## 2011-05-07 DIAGNOSIS — I83812 Varicose veins of left lower extremities with pain: Secondary | ICD-10-CM

## 2011-05-07 DIAGNOSIS — E785 Hyperlipidemia, unspecified: Secondary | ICD-10-CM

## 2011-05-07 DIAGNOSIS — J302 Other seasonal allergic rhinitis: Secondary | ICD-10-CM

## 2011-05-07 DIAGNOSIS — K219 Gastro-esophageal reflux disease without esophagitis: Secondary | ICD-10-CM

## 2011-05-07 DIAGNOSIS — N529 Male erectile dysfunction, unspecified: Secondary | ICD-10-CM

## 2011-05-07 DIAGNOSIS — I509 Heart failure, unspecified: Secondary | ICD-10-CM

## 2011-05-07 DIAGNOSIS — I10 Essential (primary) hypertension: Secondary | ICD-10-CM

## 2011-05-07 MED ORDER — INSULIN GLARGINE 100 UNIT/ML ~~LOC~~ SOLN: SUBCUTANEOUS | Status: DC

## 2011-05-07 NOTE — Telephone Encounter (Signed)
[facility] states pt needs 2 boxes to last 1 month.  Thanks

## 2011-05-12 ENCOUNTER — Telehealth: Payer: Self-pay | Admitting: Internal Medicine

## 2011-05-12 NOTE — Telephone Encounter (Signed)
Pt's wife calling re records were not received by dr balan's office as of this am, can it be refaxed today?

## 2011-05-12 NOTE — Telephone Encounter (Signed)
Called Dr.Balan's office and left message for referral contact to let me know when this patient's appointment is. Records were faxed on 04/28/2011 from our office.

## 2011-05-12 NOTE — Telephone Encounter (Signed)
Patient's neighbor Adam [name] called was told diabetic Dr is [name].Stated she will call [name] to find out when patient's appointment is.

## 2011-05-12 NOTE — Telephone Encounter (Signed)
New msg Pt wants to know if found new medical doctor. Please call

## 2011-05-12 NOTE — Telephone Encounter (Deleted)
Pt's wife calling re referral to dr ballen's office, diabetics , please fax [phone] referral and office notes

## 2011-05-12 NOTE — Telephone Encounter (Signed)
See note from 4/15

## 2011-05-13 ENCOUNTER — Telehealth: Payer: Self-pay | Admitting: *Deleted

## 2011-05-13 NOTE — Telephone Encounter (Signed)
Pt called with c/o cbg's being high, around 300 He was taken off metformin at last visit, he does take 65 units lantus at night.  He feels his vision is being affected.  For past 5 days vision is cloudy. He has an eye appointment at end of month. Pt has had weight loss of 40 pounds.  He has been trying to loose weight.  Pt has appointment tomorrow with you.  Can this wait until then? Pt # [id number]

## 2011-05-13 NOTE — Telephone Encounter (Signed)
Yes, it can wait. Will see him tomorrow.

## 2011-05-13 NOTE — Telephone Encounter (Signed)
Called dr balan's office, got fax number and faxed [id number] and all i thought she would need for new pt visit--nt

## 2011-05-13 NOTE — Telephone Encounter (Signed)
Per previous msg attached to note 05-06-11 dr balan's office has not received pt's office notes as of yesterday, needs them sent again before they can get appt, pls resend

## 2011-05-14 ENCOUNTER — Ambulatory Visit (INDEPENDENT_AMBULATORY_CARE_PROVIDER_SITE_OTHER): Payer: Medicaid Other | Admitting: Internal Medicine

## 2011-05-14 ENCOUNTER — Encounter: Payer: Self-pay | Admitting: Internal Medicine

## 2011-05-14 VITALS — BP 121/69 | HR 78 | Temp 98.4°F | Ht 73.0 in | Wt 311.8 lb

## 2011-05-14 DIAGNOSIS — F419 Anxiety disorder, unspecified: Secondary | ICD-10-CM

## 2011-05-14 DIAGNOSIS — Z79899 Other long term (current) drug therapy: Secondary | ICD-10-CM

## 2011-05-14 DIAGNOSIS — E119 Type 2 diabetes mellitus without complications: Secondary | ICD-10-CM

## 2011-05-14 DIAGNOSIS — I83812 Varicose veins of left lower extremities with pain: Secondary | ICD-10-CM

## 2011-05-14 DIAGNOSIS — I509 Heart failure, unspecified: Secondary | ICD-10-CM

## 2011-05-14 DIAGNOSIS — N529 Male erectile dysfunction, unspecified: Secondary | ICD-10-CM

## 2011-05-14 DIAGNOSIS — K219 Gastro-esophageal reflux disease without esophagitis: Secondary | ICD-10-CM

## 2011-05-14 DIAGNOSIS — I10 Essential (primary) hypertension: Secondary | ICD-10-CM

## 2011-05-14 DIAGNOSIS — J302 Other seasonal allergic rhinitis: Secondary | ICD-10-CM

## 2011-05-14 DIAGNOSIS — E785 Hyperlipidemia, unspecified: Secondary | ICD-10-CM

## 2011-05-14 MED ORDER — INSULIN GLARGINE 100 UNIT/ML ~~LOC~~ SOLN: SUBCUTANEOUS | Status: DC

## 2011-05-14 NOTE — Assessment & Plan Note (Addendum)
DM has been out of control since we discontinued metformin because of CKD 6 months ago He is on lantus 65 units and that has helped to bring it down a little bit .  He is absolutely refusing correction insulin at this time  Plan -- Increase lantus by 5-10 units (self titrate up if CBGs not controlled) . Follow up in 43month. Check CBGs 4 times a day and bring meter.

## 2011-05-14 NOTE — Progress Notes (Signed)
Patient ID: Adam [name], male   DOB: October 24, 1965, 46 y.o.   MRN: [id number] Patient ID: Adam [name], male   DOB: [birth year], 46 y.o.   MRN: [id number]   46 year old man with past medical history of diabetes, chronic kidney disease, history of arrhythmia with a defibrillator, cardiomyopathy with EF of 35%, hyperlipidemia, history of GI bleed from her did not also in December 2012 and chronic pain from arthritis comes to the clinic for followup. His CBGs are running in 300-400 and he is frustrated. He is also having some occasional vision blurring when his sugars are high. No other complaints.  He has a nephrologist for his chronic kidney disease, cardiologist for his heart condition and a gastroenterologist for his GI bleed. He follows up in the outpatient clinic for diabetes and primary care. He does not have any new complaints today. He stated that his CBGs have been running high in the past few weeks after he received steroid injection in his left knee.  He checks his CBGs at home and says there have not been any hypoglycemic episode Compliant with his medications.  Physical exam   Filed Vitals:   05/14/11 1406  BP: 121/69  Pulse: 78  Temp: 98.4 F (36.9 C)      Head:    Normocephalic, without obvious abnormality, atraumatic  Eyes:    PERRL, conjunctiva/corneas clear, EOM's intact, fundi    benign, both eyes       Neck:   Supple, symmetrical, trachea midline, no adenopathy;       thyroid:  No enlargement/tenderness/nodules; no carotid   bruit or JVD  Lungs:     Clear to auscultation bilaterally, respirations unlabored  Chest wall:    No tenderness or deformity  Heart:    Regular rate and rhythm, S1 and S2 normal, no murmur, rub   or gallop  Abdomen:     Soft, non-tender, bowel sounds active all four quadrants,    no masses, no organomegaly  Extremities:   Extremities normal, atraumatic, no cyanosis or edema  Pulses:   2+ and symmetric all extremities  Skin:   Skin color, texture,  turgor normal, no rashes or lesions  Neurologic:  nonfocal grossly    Review of system  Constitutional: Denies fever, chills, diaphoresis, appetite change and fatigue.  HEENT: Denies photophobia, eye pain, redness, hearing loss, ear pain, congestion, sore throat, rhinorrhea, sneezing, mouth sores, trouble swallowing, neck pain, neck stiffness and tinnitus.   Respiratory: Denies SOB, DOE, cough, chest tightness,  and wheezing.   Cardiovascular: Denies chest pain, palpitations and leg swelling.  Gastrointestinal: Denies nausea, vomiting, abdominal pain, diarrhea, constipation, blood in stool and abdominal distention.  Genitourinary: Denies dysuria, urgency, frequency, hematuria, flank pain and difficulty urinating.  Musculoskeletal: Denies myalgias, back pain, joint swelling, arthralgias and gait problem.  Skin: Denies pallor, rash and wound.  Neurological: Denies dizziness, seizures, syncope, weakness, light-headedness, numbness and headaches.  Hematological: Denies adenopathy. Easy bruising, personal or family bleeding history  Psychiatric/Behavioral: Denies suicidal ideation, mood changes, confusion, nervousness, sleep disturbance and agitation    Constitutional: Denies fever, chills, diaphoresis, appetite change and fatigue.  HEENT: Denies photophobia, eye pain, redness, hearing loss, ear pain, congestion, sore throat, rhinorrhea, sneezing, mouth sores, trouble swallowing, neck pain, neck stiffness and tinnitus.   Respiratory: Denies SOB, DOE, cough, chest tightness,  and wheezing.   Cardiovascular: Denies chest pain, palpitations and leg swelling.  Gastrointestinal: Denies nausea, vomiting, abdominal pain, diarrhea, constipation, blood in stool and abdominal distention.  Genitourinary: Denies  dysuria, urgency, frequency, hematuria, flank pain and difficulty urinating.  Musculoskeletal: Denies myalgias, back pain, joint swelling, arthralgias and gait problem.  Skin: Denies pallor, rash  and wound.  Neurological: Denies dizziness, seizures, syncope, weakness, light-headedness, numbness and headaches.  Hematological: Denies adenopathy. Easy bruising, personal or family bleeding history  Psychiatric/Behavioral: Denies suicidal ideation, mood changes, confusion, nervousness, sleep disturbance and agitation

## 2011-05-15 NOTE — Telephone Encounter (Signed)
Records sent again by Ledon Snare RN on 05/13/2011.

## 2011-07-01 ENCOUNTER — Encounter: Payer: Medicaid Other | Admitting: Internal Medicine

## 2011-07-09 ENCOUNTER — Telehealth: Payer: Self-pay | Admitting: *Deleted

## 2011-07-09 NOTE — Telephone Encounter (Signed)
Pt had questions about "joint juice"- advised I am not familiar with this supplement, but Dr. Darrick Penna recommends glucosamine and chondroitin or devil's claw for arthritis.

## 2011-07-09 NOTE — Telephone Encounter (Signed)
Message copied by Mora Bellman on Wed Jul 09, 2011  5:18 PM ------      Message from: CERESI, MELANIE L      Created: Wed Jul 09, 2011  1:51 PM      Regarding: phone message      Contact: [phone]       Pt would like to discuss meds

## 2011-07-12 ENCOUNTER — Other Ambulatory Visit: Payer: Self-pay | Admitting: Pulmonary Disease

## 2011-07-12 DIAGNOSIS — G4733 Obstructive sleep apnea (adult) (pediatric): Secondary | ICD-10-CM

## 2011-07-23 ENCOUNTER — Other Ambulatory Visit: Payer: Self-pay | Admitting: *Deleted

## 2011-07-23 DIAGNOSIS — F419 Anxiety disorder, unspecified: Secondary | ICD-10-CM

## 2011-07-23 DIAGNOSIS — N529 Male erectile dysfunction, unspecified: Secondary | ICD-10-CM

## 2011-07-23 DIAGNOSIS — J302 Other seasonal allergic rhinitis: Secondary | ICD-10-CM

## 2011-07-23 DIAGNOSIS — K219 Gastro-esophageal reflux disease without esophagitis: Secondary | ICD-10-CM

## 2011-07-23 DIAGNOSIS — E119 Type 2 diabetes mellitus without complications: Secondary | ICD-10-CM

## 2011-07-23 DIAGNOSIS — I83812 Varicose veins of left lower extremities with pain: Secondary | ICD-10-CM

## 2011-07-23 DIAGNOSIS — E785 Hyperlipidemia, unspecified: Secondary | ICD-10-CM

## 2011-07-23 DIAGNOSIS — I10 Essential (primary) hypertension: Secondary | ICD-10-CM

## 2011-07-23 NOTE — Telephone Encounter (Signed)
Last refill 06/21/11

## 2011-07-24 ENCOUNTER — Other Ambulatory Visit: Payer: Self-pay | Admitting: Internal Medicine

## 2011-07-24 MED ORDER — LORAZEPAM 0.5 MG PO TABS: 0.5000 mg | ORAL_TABLET | Freq: Two times a day (BID) | ORAL | Status: DC | PRN

## 2011-07-24 NOTE — Telephone Encounter (Signed)
Seen in April and was supposed to F/U May but no showed. Pls ask pt to make appt next 60 days - time to get new PCP assignment and make appt. I gave enough to last until appt.

## 2011-07-24 NOTE — Telephone Encounter (Signed)
Rx called in to pharmacy and appt made as instructed. [facility] Debbera Wolken RN 07/24/11 4:40PM

## 2011-07-24 NOTE — Telephone Encounter (Signed)
May 04/2011 appt and no showed his F/U appt, Pls ask him to make appt in 2 months with new PCP.

## 2011-07-24 NOTE — Telephone Encounter (Signed)
Flag sent to front desk pool for appt as instructed per Dr Butcher. 

## 2011-08-18 ENCOUNTER — Encounter: Payer: Self-pay | Admitting: Internal Medicine

## 2011-08-19 ENCOUNTER — Encounter: Payer: Self-pay | Admitting: Internal Medicine

## 2011-08-19 ENCOUNTER — Ambulatory Visit (INDEPENDENT_AMBULATORY_CARE_PROVIDER_SITE_OTHER): Payer: Medicaid Other | Admitting: Internal Medicine

## 2011-08-19 VITALS — BP 114/76 | HR 82 | Temp 97.1°F | Resp 20 | Ht 72.0 in | Wt 332.7 lb

## 2011-08-19 DIAGNOSIS — J302 Other seasonal allergic rhinitis: Secondary | ICD-10-CM

## 2011-08-19 DIAGNOSIS — N529 Male erectile dysfunction, unspecified: Secondary | ICD-10-CM

## 2011-08-19 DIAGNOSIS — F419 Anxiety disorder, unspecified: Secondary | ICD-10-CM

## 2011-08-19 DIAGNOSIS — I509 Heart failure, unspecified: Secondary | ICD-10-CM

## 2011-08-19 DIAGNOSIS — K219 Gastro-esophageal reflux disease without esophagitis: Secondary | ICD-10-CM

## 2011-08-19 DIAGNOSIS — I83812 Varicose veins of left lower extremities with pain: Secondary | ICD-10-CM

## 2011-08-19 DIAGNOSIS — N289 Disorder of kidney and ureter, unspecified: Secondary | ICD-10-CM

## 2011-08-19 DIAGNOSIS — Z79899 Other long term (current) drug therapy: Secondary | ICD-10-CM

## 2011-08-19 DIAGNOSIS — M25569 Pain in unspecified knee: Secondary | ICD-10-CM

## 2011-08-19 DIAGNOSIS — I10 Essential (primary) hypertension: Secondary | ICD-10-CM

## 2011-08-19 DIAGNOSIS — E785 Hyperlipidemia, unspecified: Secondary | ICD-10-CM

## 2011-08-19 DIAGNOSIS — E119 Type 2 diabetes mellitus without complications: Secondary | ICD-10-CM

## 2011-08-19 LAB — GLUCOSE, CAPILLARY: Glucose-Capillary: 138 mg/dL — ABNORMAL HIGH (ref 70–99)

## 2011-08-19 LAB — POCT GLYCOSYLATED HEMOGLOBIN (HGB A1C): Hemoglobin A1C: 6.3

## 2011-08-19 MED ORDER — INSULIN GLARGINE 100 UNIT/ML ~~LOC~~ SOLN: SUBCUTANEOUS | Status: DC

## 2011-08-19 NOTE — Assessment & Plan Note (Addendum)
Patient's HbA1c has dramatically decreased since his last visit in April, from 10.2-6.3 today. In the interim, he is self titrated his Lantus dose up to 72-75 units at bedtime. Continues to refuse daytime insulin correction, and can no longer take metformin due to chronic kidney disease.. Today he reports waking up at 5 AM recently with symptoms of shaking. These may represent hypoglycemic episodes (Somogyi phenomenon or dawn effect), in light of patient's dramatic HbA1c decrease. He is also taking a beta blocker, which will may mask symptoms of hypoglycemia.   Plan: -Have instructed patient to decrease Lantus by 10 units nightly. Instructed him to take 62-65 units of Lantus Nightly. -Informed patient that it is important for him to check his blood sugars multiple times a day, especially at nighttime and one hour after meal times. Told him to record these values and bring with him to his next appointment, along with his meter. -Patient was instructed to check blood sugars every time he has symptoms of shaking, palpitations, lightheadedness. He was instructed to call the clinic if blood glucose is less than 70 or greater than 400. -Patient is overdue for diabetic eye exam. He has an eye doctor and says he will call to make an appointment. -Should return to the clinic in 3 months, or earlier if symptomatic.

## 2011-08-19 NOTE — Patient Instructions (Addendum)
1. We think you may be having symptoms of hypoglycemia (too low blood sugar). Please decrease your insulin dose to 62-65U of lantus at night. 2. Please check your blood sugar at night, in the morning, and one hour after meals. This will help [place] see if your sugars are under good control. Please record these numbers and bring them to your next visit.  3. If you feel lightheaded, sweaty, have a fast heart beat, or feel dizzy, your blood sugar may be low. If you have any of these symptoms, please check your blood sugar and write it down. 4. Please call our clinic if your sugars are less than 70 or greater than 400. 5. Your eye exam is overdue. Please call your eye doctor to schedule an exam. 6. It is OK for you to get a steroid injection in your knee if the sports medicine clinic wants to provide this treatment.

## 2011-08-19 NOTE — Assessment & Plan Note (Signed)
Patient is managed by Dr. Tenny Craw. No acute issues today, not addressed during this visit.

## 2011-08-19 NOTE — Assessment & Plan Note (Signed)
Patient's last LDL was 56 in January. Under excellent control with simvastatin 40 mg daily. Plan -Continue current statin therapy.

## 2011-08-19 NOTE — Assessment & Plan Note (Addendum)
Patient's last creatinine was 2.54. This is returning toward baseline for him. Dr. Lowell Guitar manages his chronic kidney disease. Plan -Will defer to Dr. 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 - Contacted Dr. Lowell Guitar at [facility] inquiring as to why pt not on ACE-inhibitor therapy. He replied that pt is at high risk of complications from ACE-I, with previous acute renal failure 2/2 ACE-inhibitor therapy

## 2011-08-19 NOTE — Assessment & Plan Note (Signed)
Patient with increasing left knee pain. He takes Tylenol for pain, as he has history of gastric ulcers and cannot take NSAIDs. He is followed by sports medicine. He would like to talk to them about repeating a steroid injection, and this was effective therapy for him previously. His diabetes is under good control. We do not think that an articular steroid injection would cause him any harm at this point. Plan -Okay to receive steroid injection by sports medicine if they deem appropriate. -Continue Tylenol therapy for pain.

## 2011-08-19 NOTE — Progress Notes (Signed)
Agree with plan 

## 2011-08-19 NOTE — Progress Notes (Signed)
Subjective:   Patient ID: Adam [name] male   DOB: 1965/02/03 46 y.o.   MRN: [id number]  HPI: [name] is a 46 y.o. 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 He comes to the clinic today because he would like to receive another steroid injection in his left knee by the sports medicine clinic, but wants to check with [place] to see if his blood sugars are adequately controlled enough. He is concerned that a steroid injection might make his blood glucose rise too rapidly.  He was last seen in the clinic by Dr. Scot Dock in April. At that time his A1c was 10.2, and he was on 65 units of Lantus at night. He had previously been on metformin, but this medication was discontinued almost a year ago in the setting of his chronic kidney disease. During his appointment in April, he refused to mealtime insulin correction, and insisted on taking Lantus only once daily. Dr.Devani instructed him to increase his Lantus by 5-10 units and self titrate up his doses based on blood glucose values. He was instructed to followup in May. He presents today saying that he's been taking Lantus 72-75 units based on blood glucose values before going to bed. He says that his nighttime blood glucoses range from 250-300. Today in the clinic his HbA1c is 6.3. He denies any blood glucose values under 200, but only checks size once at night. He says he has woken up a couple of mornings at 5 AM feeling like he was shaking. He denies any palpitations or lightheadedness. Other than the joint pain in his left knee he has no complaints today. He follows up regularly with cardiology, nephrology, and gastroenterology for his other chronic medical problems. He's had no new change in medications since his last visit. His last diabetic eye exam was in  January of 2012 and last foot exam was in January of 2013.     Past Medical History  Diagnosis Date  . Other and unspecified hyperlipidemia   . Insomnia, unspecified   . Obstructive sleep apnea (adult) (pediatric)   . Abdominal pain, left upper quadrant   . Allergic rhinitis, cause unspecified   . Other primary cardiomyopathies   . Unspecified essential hypertension   . Type II or unspecified type diabetes mellitus without mention of complication, not stated as uncontrolled   . Type II or unspecified type diabetes mellitus without mention of complication, not stated as uncontrolled   . Chronic kidney disease, stage I   . Esophageal reflux   . Morbid obesity   . Dysuria   . Tobacco use disorder   . Dysrhythmia    Current Outpatient Prescriptions  Medication Sig Dispense Refill  . acetaminophen (TYLENOL) 325 MG tablet Take 650 mg by mouth every 6 (six) hours as needed.        . docusate sodium (COLACE) 100 MG capsule Take 100 mg by mouth daily.       . ferrous sulfate 325 (65 FE) MG tablet Take 325 mg by mouth daily.       . fluticasone (FLONASE) 50 MCG/ACT nasal spray Place 1 spray into the nose daily as needed.       . insulin glargine (LANTUS SOLOSTAR) 100 UNIT/ML injection Inject 70-75 Units subcutaneously once before bedtime  40 mL  3  .  loratadine (CLARITIN) 10 MG tablet TAKE 1 TABLET BY MOUTH DAILY  31 tablet  3  . LORazepam (ATIVAN) 0.5 MG tablet Take 1 tablet (0.5 mg total) by mouth 2 (two) times daily as needed.  60 tablet  1  . metolazone (ZAROXOLYN) 10 MG tablet Take 10 mg by mouth daily. Prescribed by Dr Allena Katz. Has been taking one every other day.       . metoprolol (LOPRESSOR) 100 MG tablet Take 1 tablet (100 mg total) by mouth 2 (two) times daily.  62 tablet  11  . pantoprazole (PROTONIX) 40 MG tablet Take 1 tablet (40 mg total) by mouth daily at 12 noon.  30 tablet  3  . simvastatin (ZOCOR) 40 MG tablet Take 1 tablet (40 mg total) by mouth daily.  30 tablet  11  .  spironolactone (ALDACTONE) 25 MG tablet Take 1 tablet (25 mg total) by mouth daily.  31 tablet  11  . torsemide (DEMADEX) 100 MG tablet Take 100 mg by mouth 2 (two) times daily.        . traMADol (ULTRAM) 50 MG tablet Take 1 tablet (50 mg total) by mouth 2 (two) times daily as needed for pain.  30 tablet  0  . VOLTAREN 1 % GEL APPLY 1 APPLICATION TOPICALLY 4 (FOUR) TIMES DAILY.  100 g  1  . DISCONTD: LORazepam (ATIVAN) 0.5 MG tablet Take 1 tablet (0.5 mg total) by mouth 2 (two) times daily as needed (for nerves).  60 tablet  0   Family History  Problem Relation Age of Onset  . Diabetes Mother   . Microcephaly Father   . Lung cancer Father    History   Social History  . Marital Status: Single    Spouse Name: N/A    Number of Children: N/A  . Years of Education: N/A   Social History Main Topics  . Smoking status: Current Everyday Smoker -- 0.3 packs/day    Types: Cigarettes  . Smokeless tobacco: Never Used   Comment: started smoking at age 47. Will stop on his own - cutting back now- was smoking a ppd - now only 3 a day  . Alcohol Use: No  . Drug Use: No  . Sexually Active: Yes   Other Topics Concern  . Not on file   Social History Narrative  . No narrative on file   Review of Systems: Constitutional: + shaking at 5am occasionally (as per HPI). Denies fever, chills, diaphoresis, appetite change and fatigue.  HEENT: Denies eye pain, redness, hearing loss, ear pain, congestion. Respiratory: Denies SOB, DOE, cough, chest tightness,  and wheezing.   Cardiovascular: Denies chest pain, palpitations and leg swelling.  Gastrointestinal: Denies nausea, vomiting, abdominal pain, diarrhea, constipation, blood in stool and abdominal distention.  Genitourinary: Denies dysuria, urgency, frequency, hematuria, flank pain and difficulty urinating.  Musculoskeletal: + L knee swelling. Denies myalgias, back pain, joint swelling, or arthralgias of other joints.  Skin: Denies pallor, rash and  wound.  Neurological: Denies dizziness, seizures, syncope, weakness.   Objective:  Physical Exam: Filed Vitals:   08/19/11 1338  BP: 114/76  Pulse: 82  Temp: 97.1 F (36.2 C)  TempSrc: Oral  Resp: 20  Height: 6' (1.829 m)  Weight: 332 lb 11.2 oz (150.912 kg)  SpO2: 96%   Constitutional: Vital signs reviewed.  Patient is a well-developed and well-nourished male in no acute distress and cooperative with exam. Alert and oriented x3.  Head: Normocephalic and atraumatic Mouth: no erythema or  exudates, MMM Eyes: PERRL, EOMI, no scleral icterus.  Neck: Supple, Trachea midline normal ROM, No JVD, mass, thyromegaly, or carotid bruit present.  Cardiovascular: RRR, S1 normal, S2 normal, no MRG, pulses symmetric and intact bilaterally Pulmonary/Chest: CTAB, no wheezes, rales, or rhonchi Abdominal: Soft. Non-tender, non-distended, bowel sounds are normal, no masses, organomegaly, or guarding present.  Musculoskeletal: Small effusion of L knee, normal ROM, not tender to palpation, no  Erythema Hematology: no cervical adenopathy.  Neurological: A&O x3, Strength is normal and symmetric bilaterally, cranial nerve II-XII are grossly intact, no focal motor deficit    Assessment & Plan:

## 2011-08-20 NOTE — Progress Notes (Signed)
Agree with plan 

## 2011-09-03 ENCOUNTER — Telehealth: Payer: Self-pay | Admitting: *Deleted

## 2011-09-03 NOTE — Telephone Encounter (Signed)
Message copied by Mora Bellman on Wed Sep 03, 2011  2:21 PM ------      Message from: CERESI, MELANIE L      Created: Wed Sep 03, 2011  1:54 PM      Regarding: phone message      Contact: [phone]       Pt asked if he could have medication to help with pain of his foot and knee. I scheduled him an appt on 8/14 but asked if you could call him.

## 2011-09-03 NOTE — Telephone Encounter (Signed)
Spoke with pt- he state his knee and ankle are painful and swollen worse with rainy weather.  Saw PCP who referred him back to our office, gave him medication for pain to last until appt. Rescheduled pt to come in 09/08/11 instead of 09/10/11.

## 2011-09-05 ENCOUNTER — Other Ambulatory Visit: Payer: Self-pay | Admitting: Internal Medicine

## 2011-09-05 NOTE — Telephone Encounter (Signed)
Prontonix has been on med list since Dec 2012. I couldn;t find any Rx for Nexium. Is it just medicaid that is requiring change in med?

## 2011-09-08 ENCOUNTER — Ambulatory Visit (INDEPENDENT_AMBULATORY_CARE_PROVIDER_SITE_OTHER): Payer: Medicaid Other | Admitting: Sports Medicine

## 2011-09-08 VITALS — BP 122/82 | Ht 73.0 in | Wt 311.0 lb

## 2011-09-08 DIAGNOSIS — M25569 Pain in unspecified knee: Secondary | ICD-10-CM

## 2011-09-08 NOTE — Telephone Encounter (Signed)
Crystal, the pharmacist at [facility] , stated Nexium was ordered back in 2012 and did not see the rx for Protonix. Stated no refill needed at this time for Protonix.

## 2011-09-08 NOTE — Progress Notes (Signed)
  Subjective:    Patient ID: Adam [name], male    DOB: September 27, 1965, 46 y.o.   MRN: [id number]  HPI  46 year old man with DM, CKD, and Cardiomyopathy who presents for evaluation of left knee pain. The pain started this year without trauma or a known inciting event. X-rays revealed mild degenerative joint disease of the left knee. He was originally treated with a steroid injection by Dr. Patsy Lager in the [facility] in Feb 2013. This provided pain relief for approximately 4 months. Recently, his pain has been exacerbated by rainy weather. He take tylenol b/c he cannot tolerate NSAID 2/2 hx of GI bleeding and CKD (creatine 3.0 in January 2013). He would like another injection if possible.    Review of Systems Otherwise negative    Objective:   Physical Exam BP 122/82  Ht 6\' 1"  (1.854 m)  Wt 311 lb (141.069 kg)  BMI 41.03 kg/m2 Gen: alert, oriented, pleasant, conversant, obese  MSK: Right knee: mild tenderness to palpation along lateral knee, minimal crepitus, no effusion, negative Lachman's and McMurray's test, mild varus deviation       Assessment & Plan:  46 year old M with left knee pain that is consistent with arthritic pain. He would be a better candidate for a steroid injection than PO medication given his renal insufficiency. He will follow up as needed.    Procedure Note: Left Knee Joint Injection - written consent obtained, risk and benefits reviewed with patient - area prepped with alcohol swab, anterolateral approach, injection of 3 mL of Marcaine and 1 mL of depomedrol - patient tolerated procedure well with no complications

## 2011-09-08 NOTE — Assessment & Plan Note (Signed)
2nd injection this year, expect transient increase in CBG, patient will return as needed; We are appreciative of the referral from Dr. Heloise Beecham  Procedure Note: Left Knee Intra-articular Injection - written consent obtained, risk and benefits reviewed with patient - area prepped with alcohol swab, anterolateral approach, injection of 3 mL of Marcaine and 1 mL of depomedrol - patient tolerated procedure well with no complications

## 2011-09-10 ENCOUNTER — Ambulatory Visit: Payer: Medicaid Other | Admitting: Family Medicine

## 2011-09-26 ENCOUNTER — Telehealth: Payer: Self-pay | Admitting: *Deleted

## 2011-09-26 NOTE — Telephone Encounter (Signed)
I am not familiar with this herb. He is always welcome to come and discuss worsening arthritic pain in clinic.

## 2011-09-26 NOTE — Telephone Encounter (Signed)
Pt was called - no one home. Dr Dion Body response given to Ms Lissa Hoard who talk to the pt and will call back for an appt.

## 2011-09-26 NOTE — Telephone Encounter (Signed)
Call from pt's [facility].  Wants to know if it's ok for Mr Rosato to take the herb, Turmeric, for arthritis Pain in his legs. States tylenol is not helping w/the pain. Thanks

## 2011-10-01 ENCOUNTER — Encounter: Payer: Self-pay | Admitting: Internal Medicine

## 2011-10-01 ENCOUNTER — Ambulatory Visit (INDEPENDENT_AMBULATORY_CARE_PROVIDER_SITE_OTHER): Payer: Medicaid Other | Admitting: Internal Medicine

## 2011-10-01 VITALS — BP 126/87 | HR 94 | Temp 96.7°F | Ht 73.0 in | Wt 328.7 lb

## 2011-10-01 DIAGNOSIS — M25569 Pain in unspecified knee: Secondary | ICD-10-CM

## 2011-10-01 DIAGNOSIS — E119 Type 2 diabetes mellitus without complications: Secondary | ICD-10-CM

## 2011-10-01 DIAGNOSIS — I10 Essential (primary) hypertension: Secondary | ICD-10-CM

## 2011-10-01 MED ORDER — TRAMADOL HCL 50 MG PO TABS: 50.0000 mg | ORAL_TABLET | Freq: Four times a day (QID) | ORAL | Status: DC | PRN

## 2011-10-01 NOTE — Patient Instructions (Signed)
1.  Start the Tramadol for your pain.  Take 1 tablet every 6 hours for pain.  -  It is okay to use Tylenol between times to take the Tramadol  2.  Make a follow up appointment with Sports medicine to address the knee alignment.  Talk to them about orthotics and knee braces.    3.  Continue your other medications as prescribed.

## 2011-10-01 NOTE — Progress Notes (Signed)
Subjective:   Patient ID: Adam [name] male   DOB: 1966-01-19 46 y.o.   MRN: [id number]  HPI: [name] is a 46 y.o. man who presents to clinic today for follow up of his chronic medical conditions including diabetes, hypertension, and knee pain.    He states that he had a left knee injection about 2 weeks ago at sports medicine.  He states that he was feeling better when he started to get pain in his left ankle which then moved to his right knee, and then to his right ankle and foot.  Currently only his right ankle and foot hurt.  They hurt more when he is walking or if he is standing for long periods of time.  He denies erythema, swelling, fever, chills, or trauma to the area.  He has been using tylenol and Tramadol for the pain which he states helps him.   He states that he is taking his medications as prescribed for his blood pressure and blood sugar.  He watches his weight and has been relatively stable over the last few weeks.  He states that he has been trying to watch his diet with limiting salt and sweets.  He is not due for his A1C check today but his CBG is elevated today.  Eh states that he did take his insulin yesterday evening.   Past Medical History  Diagnosis Date  . Other and unspecified hyperlipidemia   . Insomnia, unspecified   . Obstructive sleep apnea (adult) (pediatric)   . Abdominal pain, left upper quadrant   . Allergic rhinitis, cause unspecified   . Other primary cardiomyopathies   . Unspecified essential hypertension   . Type II or unspecified type diabetes mellitus without mention of complication, not stated as uncontrolled   . Chronic kidney disease, stage I   . Esophageal reflux   . Morbid obesity   . Dysuria   . Tobacco use disorder   . Dysrhythmia    Current Outpatient Prescriptions  Medication Sig Dispense Refill  . acetaminophen (TYLENOL) 325 MG tablet Take 650 mg by mouth every 6 (six) hours as needed.        . docusate sodium (COLACE) 100 MG  capsule Take 100 mg by mouth daily.       . ferrous sulfate 325 (65 FE) MG tablet Take 325 mg by mouth daily.       . fluticasone (FLONASE) 50 MCG/ACT nasal spray Place 1 spray into the nose daily as needed.       . insulin glargine (LANTUS SOLOSTAR) 100 UNIT/ML injection Inject 62-65 Units subcutaneously once before bedtime  40 mL  6  . loratadine (CLARITIN) 10 MG tablet TAKE 1 TABLET BY MOUTH DAILY  31 tablet  3  . LORazepam (ATIVAN) 0.5 MG tablet Take 1 tablet (0.5 mg total) by mouth 2 (two) times daily as needed.  60 tablet  1  . metolazone (ZAROXOLYN) 10 MG tablet Take 10 mg by mouth daily. Prescribed by Dr Allena Katz. Has been taking one every other day.       . metoprolol (LOPRESSOR) 100 MG tablet Take 1 tablet (100 mg total) by mouth 2 (two) times daily.  62 tablet  11  . pantoprazole (PROTONIX) 40 MG tablet Take 1 tablet (40 mg total) by mouth daily at 12 noon.  30 tablet  3  . simvastatin (ZOCOR) 40 MG tablet Take 1 tablet (40 mg total) by mouth daily.  30 tablet  11  . spironolactone (ALDACTONE) 25  MG tablet Take 1 tablet (25 mg total) by mouth daily.  31 tablet  11  . torsemide (DEMADEX) 100 MG tablet Take 100 mg by mouth 2 (two) times daily.        . traMADol (ULTRAM) 50 MG tablet Take 1 tablet (50 mg total) by mouth 2 (two) times daily as needed for pain.  30 tablet  0  . VOLTAREN 1 % GEL APPLY 1 APPLICATION TOPICALLY 4 (FOUR) TIMES DAILY.  100 g  1  . DISCONTD: LORazepam (ATIVAN) 0.5 MG tablet Take 1 tablet (0.5 mg total) by mouth 2 (two) times daily as needed (for nerves).  60 tablet  0   Family History  Problem Relation Age of Onset  . Diabetes Mother   . Microcephaly Father   . Lung cancer Father    History   Social History  . Marital Status: Single    Spouse Name: N/A    Number of Children: N/A  . Years of Education: N/A   Social History Main Topics  . Smoking status: Current Everyday Smoker -- 0.3 packs/day    Types: Cigarettes  . Smokeless tobacco: Never Used    Comment: started smoking at age 85. Will stop on his own - cutting back now- was smoking a ppd - now only 3 a day  . Alcohol Use: No  . Drug Use: No  . Sexually Active: Yes   Other Topics Concern  . None   Social History Narrative  . None   Review of Systems: Negative except as noted in the HPI.   Objective:  Physical Exam: Filed Vitals:   10/01/11 0917  BP: 126/87  Pulse: 94  Temp: 96.7 F (35.9 C)  TempSrc: Oral  Height: 6\' 1"  (1.854 m)  Weight: 328 lb 11.2 oz (149.097 kg)  SpO2: 96%   Constitutional: Vital signs reviewed.  Patient is a well-developed and well-nourished man in no acute distress and cooperative with exam. Alert and oriented x3.  Head: Normocephalic and atraumatic Ear: TM normal bilaterally Mouth: no erythema or exudates, MMM Eyes: PERRL, EOMI, conjunctivae normal, No scleral icterus.  Neck: Supple, Trachea midline normal ROM, No JVD, mass, thyromegaly, or carotid bruit present.  Cardiovascular: RRR, S1 normal, S2 normal, no MRG, pulses symmetric and intact bilaterally Pulmonary/Chest: CTAB, no wheezes, rales, or rhonchi Abdominal: Soft. Non-tender, non-distended, bowel sounds are normal, no masses, organomegaly, or guarding present.  GU: no CVA tenderness Musculoskeletal: hip ROM is limited in flexion bilaterally to about 75 degrees secondary to quadriceps tightness.  Strength is 5/5 bilaterally.  Bilateral Knee ROM is full.  There is mild pain to palpation over the bilateral lateral joint spaces.  Moderate knee valgus deformity is noted bilaterally.  There is mild left ankle swelling, no erythema.  Ankle ROM is full.  No joint deformities, or stiffness Hematology: no cervical, inginal, or axillary adenopathy.  Neurological: A&O x3, Strength is normal and symmetric bilaterally, cranial nerve II-XII are grossly intact, no focal motor deficit, sensory intact to light touch bilaterally.  Skin: Warm, dry and intact. No rash, cyanosis, or clubbing.  Psychiatric:  Normal mood and affect. speech and behavior is normal. Judgment and thought content normal. Cognition and memory are normal.   Assessment & Plan:

## 2011-10-04 ENCOUNTER — Other Ambulatory Visit: Payer: Self-pay | Admitting: Internal Medicine

## 2011-10-10 ENCOUNTER — Other Ambulatory Visit: Payer: Self-pay | Admitting: Internal Medicine

## 2011-10-17 ENCOUNTER — Ambulatory Visit: Payer: Medicaid Other | Admitting: Pulmonary Disease

## 2011-10-21 ENCOUNTER — Ambulatory Visit: Payer: Medicaid Other | Admitting: Pulmonary Disease

## 2011-10-28 ENCOUNTER — Emergency Department (HOSPITAL_COMMUNITY): Payer: Medicaid Other

## 2011-10-28 ENCOUNTER — Encounter (HOSPITAL_COMMUNITY): Payer: Self-pay | Admitting: Emergency Medicine

## 2011-10-28 ENCOUNTER — Telehealth: Payer: Self-pay | Admitting: *Deleted

## 2011-10-28 ENCOUNTER — Emergency Department (HOSPITAL_COMMUNITY)
Admission: EM | Admit: 2011-10-28 | Discharge: 2011-10-28 | Disposition: A | Discharge status: Home / Self Care | Payer: Medicaid Other | Attending: Emergency Medicine | Admitting: Emergency Medicine

## 2011-10-28 DIAGNOSIS — N181 Chronic kidney disease, stage 1: Secondary | ICD-10-CM | POA: Insufficient documentation

## 2011-10-28 DIAGNOSIS — Z79899 Other long term (current) drug therapy: Secondary | ICD-10-CM | POA: Insufficient documentation

## 2011-10-28 DIAGNOSIS — R12 Heartburn: Secondary | ICD-10-CM | POA: Insufficient documentation

## 2011-10-28 DIAGNOSIS — I428 Other cardiomyopathies: Secondary | ICD-10-CM | POA: Insufficient documentation

## 2011-10-28 DIAGNOSIS — F17200 Nicotine dependence, unspecified, uncomplicated: Secondary | ICD-10-CM | POA: Insufficient documentation

## 2011-10-28 DIAGNOSIS — I129 Hypertensive chronic kidney disease with stage 1 through stage 4 chronic kidney disease, or unspecified chronic kidney disease: Secondary | ICD-10-CM | POA: Insufficient documentation

## 2011-10-28 DIAGNOSIS — K30 Functional dyspepsia: Secondary | ICD-10-CM

## 2011-10-28 DIAGNOSIS — K219 Gastro-esophageal reflux disease without esophagitis: Secondary | ICD-10-CM | POA: Insufficient documentation

## 2011-10-28 DIAGNOSIS — G4733 Obstructive sleep apnea (adult) (pediatric): Secondary | ICD-10-CM | POA: Insufficient documentation

## 2011-10-28 DIAGNOSIS — Z794 Long term (current) use of insulin: Secondary | ICD-10-CM | POA: Insufficient documentation

## 2011-10-28 DIAGNOSIS — Z950 Presence of cardiac pacemaker: Secondary | ICD-10-CM | POA: Insufficient documentation

## 2011-10-28 DIAGNOSIS — E119 Type 2 diabetes mellitus without complications: Secondary | ICD-10-CM | POA: Insufficient documentation

## 2011-10-28 LAB — COMPREHENSIVE METABOLIC PANEL
ALT: 6 U/L (ref 0–53)
Alkaline Phosphatase: 161 U/L — ABNORMAL HIGH (ref 39–117)
BUN: 69 mg/dL — ABNORMAL HIGH (ref 6–23)
CO2: 22 mEq/L (ref 19–32)
Calcium: 9.7 mg/dL (ref 8.4–10.5)
GFR calc Af Amer: 28 mL/min — ABNORMAL LOW (ref >90)
GFR calc non Af Amer: 24 mL/min — ABNORMAL LOW (ref >90)
Glucose, Bld: 174 mg/dL — ABNORMAL HIGH (ref 70–99)
Sodium: 137 mEq/L (ref 135–145)
Total Protein: 8.6 g/dL — ABNORMAL HIGH (ref 6.0–8.3)

## 2011-10-28 LAB — CBC WITH DIFFERENTIAL/PLATELET
Eosinophils Absolute: 0.1 10*3/uL (ref 0.0–0.7)
Eosinophils Relative: 1 % (ref 0–5)
HCT: 30.3 % — ABNORMAL LOW (ref 39.0–52.0)
Hemoglobin: 9.7 g/dL — ABNORMAL LOW (ref 13.0–17.0)
Lymphocytes Relative: 21 % (ref 12–46)
Lymphs Abs: 1.7 10*3/uL (ref 0.7–4.0)
MCH: 29.8 pg (ref 26.0–34.0)
MCV: 93.2 fL (ref 78.0–100.0)
Monocytes Relative: 7 % (ref 3–12)
Platelets: 367 10*3/uL (ref 150–400)
RBC: 3.25 MIL/uL — ABNORMAL LOW (ref 4.22–5.81)
WBC: 8.1 10*3/uL (ref 4.0–10.5)

## 2011-10-28 LAB — TROPONIN I: Troponin I: <0.3 ng/mL (ref <0.30)

## 2011-10-28 NOTE — Telephone Encounter (Signed)
This patient needs to go to the ED to be evaluated immediately. Discussed with Inocencio Homes.

## 2011-10-28 NOTE — ED Provider Notes (Signed)
Medical screening examination/treatment/procedure(s) were conducted as a shared visit with non-physician practitioner(s) and myself.  I personally evaluated the patient during the encounter  Patient seen and examined. His speech is normal according to him. He has no concern for stroke or ACS. States that he's had a lot of heartburn with burping passing gas. He is stable for discharge  Toy Baker, MD 10/28/11 1655

## 2011-10-28 NOTE — ED Notes (Signed)
PA-C in room at this time. Pt states he ate extra crispy chicken late last night and woke with indigestion around 0100 this AM. Pt called PCP office to report the indigestion and they advised pt to come to ED for further evaluation. Pt states "I am ready to go"

## 2011-10-28 NOTE — ED Provider Notes (Signed)
History     CSN: [id number]  Arrival date & time 10/28/11  1135   First MD Initiated Contact with Patient 10/28/11 1305      Chief Complaint  Patient presents with  . Transient Ischemic Attack    (Consider location/radiation/quality/duration/timing/severity/associated sxs/prior treatment) The history is provided by the patient and medical records.   Adam [name] is a 46 y.o. male presents to the emergency department complaining of heartburn, burping.  The onset of the symptoms was  abrupt starting 10 hours ago.  The patient has associated shortness of breath.  The symptoms have been  constant, gradually improved until complete resolution prior to presentation in the emergency department.  Nothing makes the symptoms worse and baking soda mixed in warm water makes symptoms better.  The patient denies fever, chills, headache, chest pain, abdominal pain, nausea, vomiting, diarrhea, syncope, loss of consciousness, slurred speech, weakness, tingling, numbness.  Patient states he went by the "chicken whole and bought extra [place] fried chicken" which she ate about 7:30 PM. Patient states he then awoke about 3 AM with very bad heartburn and shortness of breath.  Patient states he next baking soda and warm water and then Contact. He states that made him feel much better after a large belch.  Patient states he called his doctor this morning and spoke to the nurse. The nurse told him that she thought his speech was slurred and slow and he should be evaluated in the emergency department. Patient states that he is speaking as he normally does. He states that his girlfriend was at home with him this morning and she did not notice anything different. He denies any changes in his gait, numbness, tingling, weakness, loss of consciousness, changes in vision.  He is without complaint at this time.    Past Medical History  Diagnosis Date  . Other and unspecified hyperlipidemia   . Insomnia, unspecified   .  Obstructive sleep apnea (adult) (pediatric)   . Abdominal pain, left upper quadrant   . Allergic rhinitis, cause unspecified   . Other primary cardiomyopathies   . Unspecified essential hypertension   . Type II or unspecified type diabetes mellitus without mention of complication, not stated as uncontrolled   . Chronic kidney disease, stage I   . Esophageal reflux   . Morbid obesity   . Dysuria   . Tobacco use disorder   . Dysrhythmia   . Pacemaker     Past Surgical History  Procedure Date  . Cardiac defibrillator placement 2011  . Esophagogastroduodenoscopy 01/03/2011    Procedure: ESOPHAGOGASTRODUODENOSCOPY (EGD);  Surgeon: Vertell Novak., MD;  Location: [facility] ENDOSCOPY;  Service: Endoscopy;  Laterality: N/A;    Family History  Problem Relation Age of Onset  . Diabetes Mother   . Microcephaly Father   . Lung cancer Father     History  Substance Use Topics  . Smoking status: Current Every Day Smoker -- 0.3 packs/day    Types: Cigarettes  . Smokeless tobacco: Never Used   Comment: started smoking at age 35. Will stop on his own - cutting back now- was smoking a ppd - now only 3 a day  . Alcohol Use: 0.0 oz/week    0 Cans of beer per week      Review of Systems  Constitutional: Negative for fever, diaphoresis, appetite change, fatigue and unexpected weight change.  HENT: Negative for mouth sores and neck stiffness.   Eyes: Negative for visual disturbance.  Respiratory: Positive for shortness  of breath. Negative for cough, chest tightness and wheezing.   Cardiovascular: Negative for chest pain.  Gastrointestinal: Negative for nausea, vomiting, abdominal pain, diarrhea and constipation.       Heartburn, belching  Genitourinary: Negative for dysuria, urgency, frequency and hematuria.  Skin: Negative for rash.  Neurological: Negative for syncope, light-headedness and headaches.  Psychiatric/Behavioral: Negative for disturbed wake/sleep cycle. The patient is not  nervous/anxious.   All other systems reviewed and are negative.    Allergies  Review of patient's allergies indicates no known allergies.  Home Medications   Current Outpatient Rx  Name Route Sig Dispense Refill  . ACCU-CHEK SOFTCLIX LANCETS MISC  USE TO CHECK BLOOD SUGAR UP TO 1 TIME A DAY 100 each 3    DX Code Needed .  Marland Kitchen ACETAMINOPHEN 325 MG PO TABS Oral Take 650 mg by mouth every 6 (six) hours as needed. For pain    . DOCUSATE SODIUM 100 MG PO CAPS Oral Take 100 mg by mouth daily.     Marland Kitchen FERROUS SULFATE 325 (65 FE) MG PO TABS Oral Take 325 mg by mouth daily.     Marland Kitchen FLUTICASONE PROPIONATE 50 MCG/ACT NA SUSP Nasal Place 1 spray into the nose daily as needed. For allergies    . INSULIN GLARGINE 100 UNIT/ML [place] SOLN Subcutaneous Inject 62 Units into the skin at bedtime.    Marland Kitchen LORATADINE 10 MG PO TABS  TAKE 1 TABLET BY MOUTH DAILY 30 tablet 3  . LORAZEPAM 0.5 MG PO TABS Oral Take 0.5 mg by mouth 2 (two) times daily as needed. For anxiety    . METOLAZONE 10 MG PO TABS Oral Take 10 mg by mouth daily.     Marland Kitchen METOPROLOL TARTRATE 100 MG PO TABS Oral Take 1 tablet (100 mg total) by mouth 2 (two) times daily. 62 tablet 11  . PANTOPRAZOLE SODIUM 40 MG PO TBEC Oral Take 1 tablet (40 mg total) by mouth daily at 12 noon. 30 tablet 3  . SIMVASTATIN 40 MG PO TABS Oral Take 1 tablet (40 mg total) by mouth daily. 30 tablet 11  . SPIRONOLACTONE 25 MG PO TABS Oral Take 1 tablet (25 mg total) by mouth daily. 31 tablet 11  . TORSEMIDE 100 MG PO TABS Oral Take 100 mg by mouth 2 (two) times daily.      . TRAMADOL HCL 50 MG PO TABS Oral Take 1 tablet (50 mg total) by mouth every 6 (six) hours as needed for pain. 120 tablet 0  . VOLTAREN 1 % TD GEL  APPLY 1 APPLICATION TOPICALLY 4 (FOUR) TIMES DAILY. 100 g 1    BP 131/92  Pulse 88  Temp 98.7 F (37.1 C) (Oral)  Resp 18  SpO2 99%  Physical Exam  Nursing note and vitals reviewed. Constitutional: He is oriented to person, place, and time. He appears  well-developed and well-nourished. No distress.  HENT:  Head: Normocephalic and atraumatic.  Mouth/Throat: Oropharynx is clear and moist. No oropharyngeal exudate.  Eyes: Conjunctivae normal and EOM are normal. Pupils are equal, round, and reactive to light. No scleral icterus.  Neck: Normal range of motion. Neck supple.  Cardiovascular: Normal rate, regular rhythm, normal heart sounds and intact distal pulses.  Exam reveals no gallop and no friction rub.   No murmur heard. Pulmonary/Chest: Effort normal and breath sounds normal. No respiratory distress. He has no wheezes.  Abdominal: Soft. Bowel sounds are normal. He exhibits no mass. There is no tenderness. There is no rebound  and no guarding.  Musculoskeletal: Normal range of motion. He exhibits no edema.  Lymphadenopathy:    He has no cervical adenopathy.  Neurological: He is alert and oriented to person, place, and time. He has normal reflexes. He exhibits normal muscle tone. Coordination normal.       Speech is clear and goal oriented, follows commands Major Cranial nerves without deficit, no facial droop Normal strength in upper and lower extremities bilaterally including dorsiflexion and plantar flexion, strong and equal grip strength Sensation normal to light and sharp touch Moves extremities without ataxia, coordination intact Normal finger to nose and rapid alternating movements Neg romberg, no pronator drift Normal gait  Skin: Skin is warm and dry. He is not diaphoretic.       Well healed incision from placement of pacemaker  Psychiatric: He has a normal mood and affect.    ED Course  Procedures (including critical care time)  Labs Reviewed  CBC WITH DIFFERENTIAL - Abnormal; Notable for the following:    RBC 3.25 (*)     Hemoglobin 9.7 (*)     HCT 30.3 (*)     All other components within normal limits  COMPREHENSIVE METABOLIC PANEL - Abnormal; Notable for the following:    Glucose, Bld 174 (*)     BUN 69 (*)      Creatinine, Ser 2.95 (*)     Total Protein 8.6 (*)     Albumin 3.3 (*)     Alkaline Phosphatase 161 (*)     Total Bilirubin 1.5 (*)     GFR calc non Af Amer 24 (*)     GFR calc Af Amer 28 (*)     All other components within normal limits  TROPONIN I   Dg Chest 2 View  10/28/2011  *RADIOLOGY REPORT*  Clinical Data: Hypertension, indigestion, heartburn.  CHEST - 2 VIEW  Comparison: 09/09/2008  Findings: Left AICD remains in place, unchanged.  Stable marked cardiomegaly with vascular congestion.  No overt edema.  No focal airspace opacity or effusion.  No acute bony abnormality.  IMPRESSION: Cardiomegaly, vascular congestion.   Original Report Authenticated By: Cyndie Chime, M.D.     Results for orders placed during the hospital encounter of 10/28/11  CBC WITH DIFFERENTIAL      Component Value Range   WBC 8.1  4.0 - 10.5 K/uL   RBC 3.25 (*) 4.22 - 5.81 MIL/uL   Hemoglobin 9.7 (*) 13.0 - 17.0 g/dL   HCT 16.1 (*) 09.6 - 04.5 %   MCV 93.2  78.0 - 100.0 fL   MCH 29.8  26.0 - 34.0 pg   MCHC 32.0  30.0 - 36.0 g/dL   RDW 40.9  81.1 - 91.4 %   Platelets 367  150 - 400 K/uL   Neutrophils Relative 71  43 - 77 %   Neutro Abs 5.8  1.7 - 7.7 K/uL   Lymphocytes Relative 21  12 - 46 %   Lymphs Abs 1.7  0.7 - 4.0 K/uL   Monocytes Relative 7  3 - 12 %   Monocytes Absolute 0.6  0.1 - 1.0 K/uL   Eosinophils Relative 1  0 - 5 %   Eosinophils Absolute 0.1  0.0 - 0.7 K/uL   Basophils Relative 1  0 - 1 %   Basophils Absolute 0.0  0.0 - 0.1 K/uL  COMPREHENSIVE METABOLIC PANEL      Component Value Range   Sodium 137  135 - 145 mEq/L  Potassium 3.5  3.5 - 5.1 mEq/L   Chloride 98  96 - 112 mEq/L   CO2 22  19 - 32 mEq/L   Glucose, Bld 174 (*) 70 - 99 mg/dL   BUN 69 (*) 6 - 23 mg/dL   Creatinine, Ser 1.61 (*) 0.50 - 1.35 mg/dL   Calcium 9.7  8.4 - 09.6 mg/dL   Total Protein 8.6 (*) 6.0 - 8.3 g/dL   Albumin 3.3 (*) 3.5 - 5.2 g/dL   AST 12  0 - 37 U/L   ALT 6  0 - 53 U/L   Alkaline Phosphatase 161  (*) 39 - 117 U/L   Total Bilirubin 1.5 (*) 0.3 - 1.2 mg/dL   GFR calc non Af Amer 24 (*) >90 mL/min   GFR calc Af Amer 28 (*) >90 mL/min  TROPONIN I      Component Value Range   Troponin I <0.30  <0.30 ng/mL   Dg Chest 2 View  10/28/2011  *RADIOLOGY REPORT*  Clinical Data: Hypertension, indigestion, heartburn.  CHEST - 2 VIEW  Comparison: 09/09/2008  Findings: Left AICD remains in place, unchanged.  Stable marked cardiomegaly with vascular congestion.  No overt edema.  No focal airspace opacity or effusion.  No acute bony abnormality.  IMPRESSION: Cardiomegaly, vascular congestion.   Original Report Authenticated By: Cyndie Chime, M.D.    ]    ECG:  Date: 10/28/2011  Rate: 89  Rhythm: normal sinus rhythm  QRS Axis: normal  Intervals: PR prolonged  ST/T Wave abnormalities: nonspecific T wave changes  Conduction Disutrbances:first-degree A-V block   Narrative Interpretation: NSR with 1st degree AV block, non-ischemic  Old EKG Reviewed: unchanged    1. Indigestion   2. Heartburn   3. CARDIOMYOPATHY       MDM  Adam [name] presents with indigestion and SOB overnight.  Concern for ACS.  No concern for CVA 2/2 intact neurological status and pts statement that the RN's concerns were his normal baseline.  Patient is to be discharged with recommendation to follow up with PCP in regards to today's hospital visit. Chest pain is not likely of cardiac or pulmonary etiology d/t presentation, perc negative, VSS, no tracheal deviation, no JVD or new murmur, RRR, breath sounds equal bilaterally, EKG without acute abnormalities, negative troponin, and negative CXR. Pt has been advised to return to the ED is CP becomes exertional, associated with diaphoresis or nausea, radiates to left jaw/arm, worsens or becomes concerning in any way. Pt appears reliable for follow up and is agreeable to discharge.   Case has been discussed with and seen by Dr. Lorre Nick who agrees with the above plan to  discharge.    1. Medications: usual home medications 2. Treatment: rest, drink plenty of water, take medications as directed 3. Follow Up: Primary care physician this week.        Dahlia Client Eliyas Suddreth, PA-C 10/28/11 1703

## 2011-10-28 NOTE — Telephone Encounter (Signed)
Pt called and advised to have family member bring to ED now for evaluation.  Pt voices understanding.

## 2011-10-28 NOTE — ED Notes (Signed)
Called his dr today because he had real bad indigestion  last night nurse he spoke to  thought his speech was slurred and slow  Pt states this is normal for him. Just here for a check nothing hurts he staes

## 2011-10-28 NOTE — Telephone Encounter (Signed)
Pt called and states he had dinner last night, (extra crispy chicken).  He woke up at 0300 with SOB and indigestion.  He took baking soda and received relief.  Today one side of face is swollen and speech seems off, some of words are dragging. ( He states family does not notice any changes in speech. This seems to be improving.  He is ambulatory and feels good now. No drooping of face noted. Strength in hands and legs are equal.  Denies numbness, pain   Pt # [phone]

## 2011-10-29 NOTE — ED Provider Notes (Signed)
Medical screening examination/treatment/procedure(s) were conducted as a shared visit with non-physician practitioner(s) and myself.  I personally evaluated the patient during the encounter  Toy Baker, MD 10/29/11 [phone]

## 2011-10-30 ENCOUNTER — Ambulatory Visit: Payer: Medicaid Other | Admitting: Pulmonary Disease

## 2011-11-12 ENCOUNTER — Encounter: Payer: Self-pay | Admitting: Internal Medicine

## 2011-11-12 ENCOUNTER — Ambulatory Visit (INDEPENDENT_AMBULATORY_CARE_PROVIDER_SITE_OTHER): Payer: Medicaid Other | Admitting: Internal Medicine

## 2011-11-12 VITALS — BP 114/81 | HR 95 | Temp 97.6°F | Ht 73.0 in | Wt 328.2 lb

## 2011-11-12 DIAGNOSIS — F419 Anxiety disorder, unspecified: Secondary | ICD-10-CM

## 2011-11-12 DIAGNOSIS — Z79899 Other long term (current) drug therapy: Secondary | ICD-10-CM

## 2011-11-12 DIAGNOSIS — E119 Type 2 diabetes mellitus without complications: Secondary | ICD-10-CM

## 2011-11-12 DIAGNOSIS — E785 Hyperlipidemia, unspecified: Secondary | ICD-10-CM

## 2011-11-12 DIAGNOSIS — K219 Gastro-esophageal reflux disease without esophagitis: Secondary | ICD-10-CM

## 2011-11-12 DIAGNOSIS — M1710 Unilateral primary osteoarthritis, unspecified knee: Secondary | ICD-10-CM

## 2011-11-12 DIAGNOSIS — I83812 Varicose veins of left lower extremities with pain: Secondary | ICD-10-CM

## 2011-11-12 DIAGNOSIS — J302 Other seasonal allergic rhinitis: Secondary | ICD-10-CM

## 2011-11-12 DIAGNOSIS — M25569 Pain in unspecified knee: Secondary | ICD-10-CM

## 2011-11-12 DIAGNOSIS — N529 Male erectile dysfunction, unspecified: Secondary | ICD-10-CM

## 2011-11-12 DIAGNOSIS — I10 Essential (primary) hypertension: Secondary | ICD-10-CM

## 2011-11-12 LAB — GLUCOSE, CAPILLARY: Glucose-Capillary: 167 mg/dL — ABNORMAL HIGH (ref 70–99)

## 2011-11-12 MED ORDER — TRAMADOL HCL 50 MG PO TABS: 100.0000 mg | ORAL_TABLET | Freq: Four times a day (QID) | ORAL | Status: DC | PRN

## 2011-11-12 MED ORDER — ESOMEPRAZOLE MAGNESIUM 20 MG PO CPDR: 20.0000 mg | DELAYED_RELEASE_CAPSULE | Freq: Every day | ORAL | Status: DC

## 2011-11-12 NOTE — Patient Instructions (Signed)
We have increased the Tramadol to 2 pills a day (100 mg) as needed for your pain. Schedule your follow-up appointment with Sports Medicine for your knee brace. I have stopped the Protonix and re-started the Nexium for your heartburn. Continue to take your medications as prescribed.

## 2011-11-12 NOTE — Progress Notes (Signed)
  Subjective:    Patient ID: Adam [name], male    DOB: 11/27/1965, 46 y.o.   MRN: [id number]  HPI  Pt presents for f/u of arthritis pain.  States Tramadol 50 mg not working well as well as Protonix not resolving his heartburn.  Review of Systems  Constitutional: Negative for fever and fatigue.  HENT: Negative for congestion.   Respiratory: Negative for chest tightness and shortness of breath.   Cardiovascular: Negative for chest pain.  Gastrointestinal: Negative for abdominal pain and blood in stool.  Musculoskeletal: Positive for joint swelling and arthralgias.  Neurological: Negative for dizziness, numbness and headaches.       Objective:   Physical Exam  Constitutional: He is oriented to person, place, and time. He appears well-developed and well-nourished.  HENT:  Head: Normocephalic and atraumatic.  Cardiovascular: Normal rate, regular rhythm and normal heart sounds.   Pulmonary/Chest: Effort normal and breath sounds normal.  Abdominal: Soft. Bowel sounds are normal.  Musculoskeletal: He exhibits no tenderness.       Hands: Neurological: He is alert and oriented to person, place, and time.  Skin: Skin is warm and dry.  Psychiatric: He has a normal mood and affect.          Assessment & Plan:  1. OA: increased Tramadol to 100 mg q4h prn, f/u Sports med for knee brace 2. GERD: d/c Protonix and restart Nexium 3. DM2: HgbA1c elevated at 7.2 today on Lantus 62 qhs, not compliant with diet recently 4. Htn: at goal on metoprolol

## 2011-11-27 ENCOUNTER — Other Ambulatory Visit: Payer: Self-pay | Admitting: Internal Medicine

## 2011-11-28 NOTE — Telephone Encounter (Signed)
Rx called in to pharmacy and front desk pool aware appt 01/2012 with PCP per Dr Rogelia Boga.

## 2011-11-28 NOTE — Telephone Encounter (Signed)
Needs jan appt with PCP.  Need for Ativan never addressed in Epic. Will need addressed and documented at Jan appt.   Since getting so freq will refill to last until Jan appt (do not know need but appears the 60 last > 1 month)

## 2011-12-04 ENCOUNTER — Other Ambulatory Visit: Payer: Self-pay | Admitting: Internal Medicine

## 2011-12-10 ENCOUNTER — Other Ambulatory Visit: Payer: Self-pay | Admitting: Internal Medicine

## 2011-12-10 NOTE — Telephone Encounter (Signed)
LDL at goal. Needs Jan appt with PCP.

## 2011-12-10 NOTE — Telephone Encounter (Signed)
Message sent to front desk to schedule pt an appt. 

## 2011-12-22 NOTE — Assessment & Plan Note (Signed)
BP Readings from Last 4 Encounters:  10/01/11 126/87  09/08/11 122/82  08/19/11 114/76  05/14/11 121/69   Blood pressure is well within his goal.  I encouraged him to continue his medications as prescribed and to watch his intake of salt.

## 2011-12-22 NOTE — Assessment & Plan Note (Addendum)
He continues to have problems with bilateral knee and ankle pain.  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  He has improved with his steroid injection that was done at Sports Medicine 2 weeks ago.  We discussed asking about knee braces and foot orthosis to try to help with his valgus deformity.  We will refill his Tramadol today for his pain since he can not use NSAIDs because of his CKD.

## 2011-12-22 NOTE — Assessment & Plan Note (Signed)
Lab Results  Component Value Date   HGBA1C 6.3 08/19/2011   HGBA1C 10.2 05/14/2011   Lab Results  Component Value Date   MICROALBUR 1.34 01/01/2011   LDLCALC 56 02/20/2011   CREATININE 2.95* 10/28/2011   His A1C is not due at this time.  He was encouraged to continue his insulin as directed.  His A1C may be falsely lowered because of his CKD.  His CBG today is elevated but he also received a steroid injection 2 weeks ago which may still be causing his blood sugar to be high.

## 2011-12-29 ENCOUNTER — Emergency Department (HOSPITAL_COMMUNITY)
Admission: EM | Admit: 2011-12-29 | Discharge: 2011-12-30 | Disposition: A | Discharge status: Home / Self Care | Payer: Medicaid Other | Attending: Emergency Medicine | Admitting: Emergency Medicine

## 2011-12-29 ENCOUNTER — Encounter (HOSPITAL_COMMUNITY): Payer: Self-pay | Admitting: Nurse Practitioner

## 2011-12-29 DIAGNOSIS — I83893 Varicose veins of bilateral lower extremities with other complications: Secondary | ICD-10-CM | POA: Insufficient documentation

## 2011-12-29 DIAGNOSIS — Z8719 Personal history of other diseases of the digestive system: Secondary | ICD-10-CM | POA: Insufficient documentation

## 2011-12-29 DIAGNOSIS — Z794 Long term (current) use of insulin: Secondary | ICD-10-CM | POA: Insufficient documentation

## 2011-12-29 DIAGNOSIS — R58 Hemorrhage, not elsewhere classified: Secondary | ICD-10-CM

## 2011-12-29 DIAGNOSIS — E785 Hyperlipidemia, unspecified: Secondary | ICD-10-CM | POA: Insufficient documentation

## 2011-12-29 DIAGNOSIS — G4733 Obstructive sleep apnea (adult) (pediatric): Secondary | ICD-10-CM | POA: Insufficient documentation

## 2011-12-29 DIAGNOSIS — Z950 Presence of cardiac pacemaker: Secondary | ICD-10-CM | POA: Insufficient documentation

## 2011-12-29 DIAGNOSIS — G4700 Insomnia, unspecified: Secondary | ICD-10-CM | POA: Insufficient documentation

## 2011-12-29 DIAGNOSIS — Z79899 Other long term (current) drug therapy: Secondary | ICD-10-CM | POA: Insufficient documentation

## 2011-12-29 DIAGNOSIS — N189 Chronic kidney disease, unspecified: Secondary | ICD-10-CM | POA: Insufficient documentation

## 2011-12-29 DIAGNOSIS — F17200 Nicotine dependence, unspecified, uncomplicated: Secondary | ICD-10-CM | POA: Insufficient documentation

## 2011-12-29 DIAGNOSIS — Z8679 Personal history of other diseases of the circulatory system: Secondary | ICD-10-CM | POA: Insufficient documentation

## 2011-12-29 DIAGNOSIS — E119 Type 2 diabetes mellitus without complications: Secondary | ICD-10-CM | POA: Insufficient documentation

## 2011-12-29 DIAGNOSIS — I129 Hypertensive chronic kidney disease with stage 1 through stage 4 chronic kidney disease, or unspecified chronic kidney disease: Secondary | ICD-10-CM | POA: Insufficient documentation

## 2011-12-29 LAB — POCT I-STAT, CHEM 8
Calcium, Ion: 1.09 mmol/L — ABNORMAL LOW (ref 1.12–1.23)
HCT: 27 % — ABNORMAL LOW (ref 39.0–52.0)
Hemoglobin: 9.2 g/dL — ABNORMAL LOW (ref 13.0–17.0)
Sodium: 139 mEq/L (ref 135–145)
TCO2: 23 mmol/L (ref 0–100)

## 2011-12-29 LAB — CBC WITH DIFFERENTIAL/PLATELET
Basophils Absolute: 0 10*3/uL (ref 0.0–0.1)
Basophils Relative: 0 % (ref 0–1)
Eosinophils Relative: 1 % (ref 0–5)
HCT: 27.8 % — ABNORMAL LOW (ref 39.0–52.0)
MCHC: 32 g/dL (ref 30.0–36.0)
MCV: 93 fL (ref 78.0–100.0)
Monocytes Absolute: 0.6 10*3/uL (ref 0.1–1.0)
Platelets: 291 10*3/uL (ref 150–400)
RDW: 15.2 % (ref 11.5–15.5)

## 2011-12-29 MED ORDER — SODIUM CHLORIDE 0.9 % IV BOLUS (SEPSIS)
1000.0000 mL | Freq: Once | INTRAVENOUS | Status: AC
  Administered 2011-12-29: 500 mL via INTRAVENOUS

## 2011-12-29 MED ORDER — SODIUM CHLORIDE 0.9 % IV SOLN: Freq: Once | INTRAVENOUS | Status: DC

## 2011-12-29 NOTE — ED Provider Notes (Signed)
11:21 PM Pt to move to CDU for holding.  Sign out received from [employer], PA-C.  Pt with 20 minutes of bleeding from wound on foot, repaired in ED.  Hgb 8.9, slightly lower than baseline.  Plan is to recheck CBC at 5am.  If declining, pt to be admitted.  If stable, pt may be d/c home.    [place], [place] 12/30/11 [phone]

## 2011-12-29 NOTE — ED Notes (Addendum)
Per EMS. Pt left foot vein ruptured. Appx. 80-100cc of blood lost. Bleeding controlled. HR 60's and BP 138 palpated.

## 2011-12-29 NOTE — ED Provider Notes (Signed)
Patient with pinpoint area of left leg bleeding actively from varicose vein. No other complaint  Doug Sou, MD 12/29/11 2201

## 2011-12-29 NOTE — ED Provider Notes (Signed)
History     CSN: [id number]  Arrival date & time 12/29/11  2013   First MD Initiated Contact with Patient 12/29/11 2019      Chief Complaint  Patient presents with  . vericose vein rupture     (Consider location/radiation/quality/duration/timing/severity/associated sxs/prior treatment) HPI Comments: Adam [name] is a 46 y.o. male  Who presents with complaint of bleeding from left foot. Pt states he was scratching left foot at home and it started "squirting blood all over walls and carpet." States unable to stop it at home. States it bled for about 10 min, EMS was called, transferred here. Pt denies being dizzy, denies light headiness, denies chest pain, SOB. States  Never had similar problem before. No numbness or weakness in feet or toes. No other complaints.    Past Medical History  Diagnosis Date  . Other and unspecified hyperlipidemia   . Insomnia, unspecified   . Obstructive sleep apnea (adult) (pediatric)   . Abdominal pain, left upper quadrant   . Allergic rhinitis, cause unspecified   . Other primary cardiomyopathies   . Unspecified essential hypertension   . Type II or unspecified type diabetes mellitus without mention of complication, not stated as uncontrolled   . Chronic kidney disease, stage I   . Esophageal reflux   . Morbid obesity   . Dysuria   . Tobacco use disorder   . Dysrhythmia   . Pacemaker     Past Surgical History  Procedure Date  . Cardiac defibrillator placement 2011  . Esophagogastroduodenoscopy 01/03/2011    Procedure: ESOPHAGOGASTRODUODENOSCOPY (EGD);  Surgeon: Vertell Novak., MD;  Location: [facility] ENDOSCOPY;  Service: Endoscopy;  Laterality: N/A;    Family History  Problem Relation Age of Onset  . Diabetes Mother   . Microcephaly Father   . Lung cancer Father     History  Substance Use Topics  . Smoking status: Current Every Day Smoker -- 0.3 packs/day    Types: Cigarettes  . Smokeless tobacco: Never Used     Comment: started smoking  at age 13. Will stop on his own - cutting back now- was smoking a ppd - now only 3 a day  . Alcohol Use: 0.0 oz/week    0 Cans of beer per week      Review of Systems  Constitutional: Negative for fever and chills.  Respiratory: Negative.   Cardiovascular: Negative.   Skin: Positive for wound.  Neurological: Negative for dizziness, light-headedness and headaches.  Hematological: Does not bruise/bleed easily.    Allergies  Review of patient's allergies indicates no known allergies.  Home Medications   Current Outpatient Rx  Name  Route  Sig  Dispense  Refill  . ACETAMINOPHEN 325 MG PO TABS   Oral   Take 650 mg by mouth every 6 (six) hours as needed. For pain         . DOCUSATE SODIUM 100 MG PO CAPS   Oral   Take 100 mg by mouth daily.          Marland Kitchen ESOMEPRAZOLE MAGNESIUM 20 MG PO CPDR   Oral   Take 1 capsule (20 mg total) by mouth daily before breakfast.   30 capsule   11   . FERROUS SULFATE 325 (65 FE) MG PO TABS   Oral   Take 325 mg by mouth daily.          Marland Kitchen FLUTICASONE PROPIONATE 50 MCG/ACT NA SUSP   Nasal   Place 1 spray into the  nose daily as needed. For allergies         . INSULIN GLARGINE 100 UNIT/ML [place] SOLN   Subcutaneous   Inject 62 Units into the skin at bedtime.         Marland Kitchen LORATADINE 10 MG PO TABS      TAKE 1 TABLET BY MOUTH DAILY   30 tablet   3   . LORAZEPAM 0.5 MG PO TABS   Oral   Take 0.5 mg by mouth 2 (two) times daily as needed. For anxiety         . METOLAZONE 10 MG PO TABS   Oral   Take 10 mg by mouth daily.          Marland Kitchen METOPROLOL TARTRATE 100 MG PO TABS      TAKE 1 TABLET BY MOUTH TWICE A DAY   62 tablet   10   . SIMVASTATIN 40 MG PO TABS      TAKE 1 TABLET BY MOUTH DAILY   30 tablet   11   . SPIRONOLACTONE 25 MG PO TABS   Oral   Take 1 tablet (25 mg total) by mouth daily.   31 tablet   11   . TORSEMIDE 100 MG PO TABS   Oral   Take 100 mg by mouth 2 (two) times daily.           . TRAMADOL HCL 50 MG PO  TABS   Oral   Take 2 tablets (100 mg total) by mouth every 6 (six) hours as needed for pain. Do not exceed 8 pills a day (400mg ).   120 tablet   1   . VOLTAREN 1 % TD GEL      APPLY 1 APPLICATION TOPICALLY 4 TIMES A DAY   100 g   1   . ACCU-CHEK SOFTCLIX LANCETS MISC      USE TO CHECK BLOOD SUGAR UP TO 1 TIME A DAY   100 each   3     DX Code Needed .     BP 90/58  Pulse 117  Temp 98.6 F (37 C) (Oral)  Resp 18  SpO2 99%  Physical Exam  Nursing note and vitals reviewed. Constitutional: He is oriented to person, place, and time. He appears well-developed and well-nourished. No distress.       Appears older than stated age  HENT:  Head: Normocephalic.  Neck: Neck supple.  Cardiovascular: Normal rate and normal heart sounds.        tachycardic  Pulmonary/Chest: Effort normal and breath sounds normal. No respiratory distress. He has no rales.       Diminished breath sounds bilaterally  Abdominal: Soft. Bowel sounds are normal. He exhibits no distension. There is no tenderness. There is no rebound.  Musculoskeletal: He exhibits edema.       1+ LE pitting edema with vericose veins. There is a squirting bleed from a pin point wound to the left dorsal foot. Non tender. Toes all pink, good cap refill. Normal dorsal pedal pulse.   Neurological: He is alert and oriented to person, place, and time.  Skin: Skin is warm and dry.  Psychiatric: He has a normal mood and affect.    ED Course  Procedures (including critical care time)  Labs Reviewed  CBC WITH DIFFERENTIAL - Abnormal; Notable for the following:    RBC 2.99 (*)     Hemoglobin 8.9 (*)     HCT 27.8 (*)     All  other components within normal limits  POCT I-STAT, CHEM 8 - Abnormal; Notable for the following:    BUN 113 (*)     Creatinine, Ser 3.40 (*)     Glucose, Bld 143 (*)     Calcium, Ion 1.09 (*)     Hemoglobin 9.2 (*)     HCT 27.0 (*)     All other components within normal limits   LACERATION  REPAIR Performed by: Lottie Mussel Authorized by: Jaynie Crumble A Consent: Verbal consent obtained. Risks and benefits: risks, benefits and alternatives were discussed Consent given by: patient Patient identity confirmed: provided demographic data Prepped and Draped in normal sterile fashion Wound explored  Laceration Location: left foot  Laceration Length: <2 cm  No Foreign Bodies seen or palpated  Anesthesia: none Irrigation method: syringe Amount of cleaning: standard  Pt with squirting bleed. I used 4.0 prolene and did at fiest a simple interrupted stitch to reduce the bleeding, and did 1 figure 8 stitch, which stopped the bleeding completely  Patient tolerance: Patient tolerated the procedure well with no immediate complications.    9:48 PM Bleeding stopped with two stitches. Pt continues to have good blood supply to the toes and distal foot with cap refill <2 sec  Pt's HR not improved with 500cc Bolus, due to his hx of CHF, do not want to give too much. Still tachycardic in 120s. Hgb low, below baseline. Discussed with Dr. Ethelda Chick. Will move to CDU, repeat hgb at 5am.   11:34 PM Pt appears pt's hemoccult result was documented wrong under his name. Pt did not have hemoccult done during todays visit and denies any blood in stool or black stools.   No diagnosis found.    MDM          Lottie Mussel, PA 12/31/11 [phone]

## 2011-12-30 LAB — CBC WITH DIFFERENTIAL/PLATELET
Basophils Absolute: 0 10*3/uL (ref 0.0–0.1)
Basophils Relative: 0 % (ref 0–1)
Eosinophils Absolute: 0.1 10*3/uL (ref 0.0–0.7)
Eosinophils Relative: 2 % (ref 0–5)
HCT: 26.1 % — ABNORMAL LOW (ref 39.0–52.0)
Lymphocytes Relative: 19 % (ref 12–46)
Lymphs Abs: 2.4 10*3/uL (ref 0.7–4.0)
Monocytes Absolute: 0.6 10*3/uL (ref 0.1–1.0)
Neutro Abs: 10.6 10*3/uL — ABNORMAL HIGH (ref 1.7–7.7)
Neutrophils Relative %: 73 % (ref 43–77)
Neutrophils Relative %: 68 % (ref 43–77)
Platelets: 307 10*3/uL (ref 150–400)
RBC: 2.79 MIL/uL — ABNORMAL LOW (ref 4.22–5.81)
RDW: 13.8 % (ref 11.5–15.5)
RDW: 15.2 % (ref 11.5–15.5)
WBC: 14.5 10*3/uL — ABNORMAL HIGH (ref 4.0–10.5)
WBC: 9.9 10*3/uL (ref 4.0–10.5)

## 2011-12-30 NOTE — ED Provider Notes (Signed)
Medical screening examination/treatment/procedure(s) were conducted as a shared visit with non-physician practitioner(s) and myself.  I personally evaluated the patient during the encounter  Doug Sou, MD 12/30/11 [phone]

## 2011-12-30 NOTE — ED Notes (Signed)
Breakfast 12/30/2011

## 2012-01-01 NOTE — ED Provider Notes (Signed)
Medical screening examination/treatment/procedure(s) were conducted as a shared visit with non-physician practitioner(s) and myself.  I personally evaluated the patient during the encounter  Doug Sou, MD 01/01/12 [phone]

## 2012-01-05 ENCOUNTER — Ambulatory Visit (INDEPENDENT_AMBULATORY_CARE_PROVIDER_SITE_OTHER): Payer: Medicaid Other | Admitting: Internal Medicine

## 2012-01-05 ENCOUNTER — Encounter: Payer: Self-pay | Admitting: Internal Medicine

## 2012-01-05 VITALS — BP 115/76 | HR 112 | Temp 97.4°F | Ht 73.0 in | Wt 317.7 lb

## 2012-01-05 DIAGNOSIS — X58XXXA Exposure to other specified factors, initial encounter: Secondary | ICD-10-CM

## 2012-01-05 DIAGNOSIS — S91309A Unspecified open wound, unspecified foot, initial encounter: Secondary | ICD-10-CM | POA: Insufficient documentation

## 2012-01-05 DIAGNOSIS — Z950 Presence of cardiac pacemaker: Secondary | ICD-10-CM

## 2012-01-05 DIAGNOSIS — R195 Other fecal abnormalities: Secondary | ICD-10-CM | POA: Insufficient documentation

## 2012-01-05 DIAGNOSIS — E1129 Type 2 diabetes mellitus with other diabetic kidney complication: Secondary | ICD-10-CM

## 2012-01-05 DIAGNOSIS — F17200 Nicotine dependence, unspecified, uncomplicated: Secondary | ICD-10-CM

## 2012-01-05 DIAGNOSIS — D649 Anemia, unspecified: Secondary | ICD-10-CM | POA: Insufficient documentation

## 2012-01-05 DIAGNOSIS — I5022 Chronic systolic (congestive) heart failure: Secondary | ICD-10-CM

## 2012-01-05 DIAGNOSIS — Z95810 Presence of automatic (implantable) cardiac defibrillator: Secondary | ICD-10-CM | POA: Insufficient documentation

## 2012-01-05 DIAGNOSIS — E119 Type 2 diabetes mellitus without complications: Secondary | ICD-10-CM

## 2012-01-05 DIAGNOSIS — K219 Gastro-esophageal reflux disease without esophagitis: Secondary | ICD-10-CM

## 2012-01-05 DIAGNOSIS — I129 Hypertensive chronic kidney disease with stage 1 through stage 4 chronic kidney disease, or unspecified chronic kidney disease: Secondary | ICD-10-CM

## 2012-01-05 DIAGNOSIS — I10 Essential (primary) hypertension: Secondary | ICD-10-CM

## 2012-01-05 LAB — COMPREHENSIVE METABOLIC PANEL
ALT: 5 U/L (ref 0–53)
AST: 14 U/L (ref 0–37)
Alkaline Phosphatase: 118 U/L — ABNORMAL HIGH (ref 39–117)
Creat: 3.35 mg/dL — ABNORMAL HIGH (ref 0.50–1.35)
Sodium: 138 mEq/L (ref 135–145)
Total Bilirubin: 1.4 mg/dL — ABNORMAL HIGH (ref 0.3–1.2)
Total Protein: 8.8 g/dL — ABNORMAL HIGH (ref 6.0–8.3)

## 2012-01-05 LAB — CBC
MCH: 29.5 pg (ref 26.0–34.0)
MCHC: 31.8 g/dL (ref 30.0–36.0)
MCV: 92.8 fL (ref 78.0–100.0)
Platelets: 387 10*3/uL (ref 150–400)
RBC: 3.19 MIL/uL — ABNORMAL LOW (ref 4.22–5.81)
RDW: 15.1 % (ref 11.5–15.5)

## 2012-01-05 LAB — ANEMIA PANEL
%SAT: 7 % — ABNORMAL LOW (ref 20–55)
Folate: 9.6 ng/mL
Iron: 32 ug/dL — ABNORMAL LOW (ref 42–165)
TIBC: 457 ug/dL — ABNORMAL HIGH (ref 215–435)
UIBC: 425 ug/dL — ABNORMAL HIGH (ref 125–400)
Vitamin B-12: 609 pg/mL (ref 211–911)

## 2012-01-05 LAB — GLUCOSE, CAPILLARY: Glucose-Capillary: 99 mg/dL (ref 70–99)

## 2012-01-05 MED ORDER — LORATADINE 10 MG PO TABS: 10.0000 mg | ORAL_TABLET | Freq: Every day | ORAL | Status: DC

## 2012-01-05 MED ORDER — FERROUS SULFATE 325 (65 FE) MG PO TABS: 325.0000 mg | ORAL_TABLET | Freq: Two times a day (BID) | ORAL | Status: DC

## 2012-01-05 NOTE — Assessment & Plan Note (Signed)
  Assessment: 1. The patient was counseled on the dangers of tobacco use, which include, but are not limited to cardiovascular disease, increased cancer risk of multiple types of cancer, COPD, peripheral vascular disease, strokes. 2. He was also counseled on the benefits of smoking cessation. 3. Progress toward smoking cessation:    4. Barriers to progress toward smoking cessation:  smoking the same amount  Plan: 1. Instruction/counseling given: The patient was firmly advised to quit and referred to a tobacco cessation program.   2. We also reviewed strategies to maximize success, including:  Removing cigarettes and smoking materials from environment  Stress management  Substitution of other forms of reinforcement Support of family/friends.  Selecting a quit date. 3. Educational resources provided: smoking cessation handout (tips, strategies, fact sheets) 4. Self management tools provided:   5. Medications to assist with smoking cessation: None 6. Patient agreed to the following self-care plans for smoking cessation: set a quit date and stop smoking

## 2012-01-05 NOTE — Assessment & Plan Note (Signed)
Pertinent Labs: Lab Results  Component Value Date   HGBA1C 7.2 11/12/2011   HGBA1C 12.1 02/13/2010   CREATININE 3.40* 12/29/2011   CREATININE 3.00* 02/20/2011   MICROALBUR 1.34 01/01/2011   MICRALBCREAT 21.8 01/01/2011   CHOL 100 02/20/2011   HDL 29* 02/20/2011   TRIG 77 02/20/2011    Assessment: Disease Control: good control (HgbA1C at goal)  Progress toward goals: unchanged  Barriers to meeting goals: no barriers identified  Microvascular complications: nephropathy and retinopathy?  Macrovascular complications: none  On aspirin: No: can consider addition after GI bleeding is evaluated.    On statin: Yes  On ACE-I/ ARB: No: seems to have been discontinued during last admission in 12/2010 in setting of worsening renal function. May benefit from readdition in future in setting of his concurrent CKD. However, will defer to his PCP and nephrologist.    Patient is compliant all of the time with prescribed medications.   Plan: Glucometer log was not reviewed today, as pt did not have glucometer available for review.   continue current medications  no instruction/counseling   Educational resources provided:    Self management tools provided: instructions for home glucose monitoring  Home glucose monitoring recommendation: once a day before breakfast

## 2012-01-05 NOTE — Assessment & Plan Note (Signed)
Pertinent Data: BP Readings from Last 3 Encounters:  01/05/12 115/76  12/30/11 115/75  11/12/11 114/81    Basic Metabolic Panel:    Component Value Date/Time   NA 139 12/29/2011 2117   K 3.8 12/29/2011 2117   CL 100 12/29/2011 2117   CO2 22 10/28/2011 1217   BUN 113* 12/29/2011 2117   CREATININE 3.40* 12/29/2011 2117   CREATININE 3.00* 02/20/2011 1139   GLUCOSE 143* 12/29/2011 2117   CALCIUM 9.7 10/28/2011 1217    Assessment: Disease Control: controlled  Progress toward goals: at goal  Barriers to meeting goals: no barriers identified    Patient is compliant most of the time with prescribed medications. However, he has been recently increasing his torsemide occasionally to help with his lower extremity edema.  No longer on ACE-I (stopped in 12/2010 hospitalization in setting of worsening renal function). However, in setting of his CKD and cardiomyopathy, may benefit from its readdition if renal function stabilizes.  Plan:  continue current medications  Advised to consult with his cardiologist regarding adjustment of his torsemide (as his LE edema may be multifactorial)  Depending on renal function, can consider readdition of ACE-I (defer to his PCP, nephrologist, and cardiologist)  Educational resources provided: handout  Self management tools provided:

## 2012-01-05 NOTE — Assessment & Plan Note (Signed)
Pertinent Data: Vitals - 1 value per visit 01/05/2012 12/30/2011 11/12/2011 10/28/2011  Weight (lb) 317.7  328.2   HEIGHT 6\' 1"   6\' 1"    BMI 41.92  43.31    Vitals - 1 value per visit 10/01/2011 09/08/2011  Weight (lb) 328.7 311  HEIGHT 6\' 1"  6\' 1"   BMI 43.38 41.04    Basic Metabolic Panel:    Component Value Date/Time   NA 139 12/29/2011 2117   K 3.8 12/29/2011 2117   CL 100 12/29/2011 2117   CO2 22 10/28/2011 1217   BUN 113* 12/29/2011 2117   CREATININE 3.40* 12/29/2011 2117   CREATININE 3.00* 02/20/2011 1139   GLUCOSE 143* 12/29/2011 2117   CALCIUM 9.7 10/28/2011 1217    Assessment: Patient is below his baseline weight, has no indication of pulmonary edema or JVD on exam,  to raise concern for acute on chronic exacerbation. However, he does continue to have lower extremity edema, which I think is likely multifactorial contributed by his CHF, but also dependent edema in setting of peripheral vascular disease. He is occasionally doubling up his torsemide to help with LE edema.  Plan:      Will refer back to his cardiologist, Dr. Tenny Craw, who he was supposed to follow-up with in 08/2011 (but has not followed up).  As mentioned above, not on ACE-I at this time. May benefit from this addition in future if renal function stabilizes - will defer to her specialists for now.

## 2012-01-05 NOTE — Patient Instructions (Addendum)
General Instructions:  Please follow-up at the clinic in 1 month with your PCP, at which time we will reevaluate your diabetes, blood pressure - OR, please follow-up in the clinic sooner if needed.  There have been changes in your medications:  INCREASE your iron to twice daily.  INCREASE your colace to twice daily (for constipation - hold for loose stools).    Follow-up with Dr. Tenny Craw (Cardiology) at your scheduled appointment.  Call [facility] to get set up for your colonoscopy/ EGD.  You are getting labs today, if they are abnormal I will give you a call.   If you have been started on new medication(s), and you develop symptoms concerning for allergic reaction, including, but not limited to, throat closing, tongue swelling, rash, please stop the medication immediately and call the clinic at [phone], and go to the ER.  If you are diabetic, please bring your meter to your next visit.  If symptoms worsen, or new symptoms arise, please call the clinic or go to the ER.  Please bring all of your medications in a bag to your next visit.    Treatment Goals:  Goals (1 Years of Data) as of 01/05/2012          11/12/11 08/19/11 05/14/11 02/20/11 11/19/10     Result Component    . HEMOGLOBIN A1C < 7.0  7.2 6.3 10.2 11.1 6.9      Progress Toward Treatment Goals:  Treatment Goal 01/05/2012  Hemoglobin A1C unchanged  Blood pressure at goal  Stop smoking smoking the same amount    Self Care Goals & Plans:  Self Care Goal 01/05/2012  Manage my medications take my medicines as prescribed  Monitor my health keep track of my blood pressure  Eat healthy foods eat more vegetables  Be physically active park at the far end of the parking lot  Stop smoking set a quit date and stop smoking    Home Blood Glucose Monitoring 01/05/2012  Check my blood sugar once a day  When to check my blood sugar before breakfast     Care Management & Community Referrals:  Referral 01/05/2012  Referrals  made for care management support none needed     LIFESTYLE TIPS TO HELP WITH YOUR BLOOD PRESSURE CONTROL  WEIGHT REDUCTION:  Strategies: A healthy weight loss program includes:  A calorie restricted diet based on individual calorie needs.   Increased physical activity (exercise).  An exercise program is just as important as the right low-calorie diet.    An unhealthy weight loss program includes:  Fasting.   Fad diets.   Supplements and drugs.  These choices do not succeed in long-term weight control.   Home Care Instructions: To help you make the needed dietary changes:   Exercise and perform physical activity as directed by your caregiver.   Keep a daily record of everything you eat. There are many free websites to help you with this. It may be helpful to measure your foods so you can determine if you are eating the correct portion sizes.   Use low-calorie cookbooks or take special cooking classes.   Avoid alcohol. Drink more water and drinks with no calories.   Take vitamins and supplements only as recommended by your caregiver.   Weight loss support groups, Registered Dieticians, counselors, and stress reduction education can also be very helpful.   ________________________________________________________________________  DASH DIET:  The DASH diet stands for "Dietary Approaches to Stop Hypertension." It is a healthy eating plan  that has been shown to reduce high blood pressure (hypertension) in as little as 14 days, while also possibly providing other significant health benefits. These other health benefits include reducing the risk of breast cancer after menopause and reducing the risk of type 2 diabetes, heart disease, colon cancer, and stroke. Health benefits also include weight loss and slowing kidney failure in patients with chronic kidney disease.   Diet guidelines: Limit salt (sodium). Your diet should contain less than 1500 mg of sodium daily.  Limit refined  or processed carbohydrates. Your diet should include mostly whole grains. Desserts and added sugars should be used sparingly.  Include small amounts of heart-healthy fats. These types of fats include nuts, oils, and tub margarine. Limit saturated and trans fats. These fats have been shown to be harmful in the body.   Choosing Foods: The following food groups are based on a 2000 calorie diet. See your Registered Dietitian for individual calorie needs.  Grains and Grain Products (6 to 8 servings daily)  Eat More Often: Whole-wheat bread, brown rice, whole-grain or wheat pasta, quinoa, popcorn without added fat or salt (air popped).  Eat Less Often: White bread, white pasta, white rice, cornbread.  Vegetables (4 to 5 servings daily)  Eat More Often: Fresh, frozen, and canned vegetables. Vegetables may be raw, steamed, roasted, or grilled with a minimal amount of fat.  Eat Less Often/Avoid: Creamed or fried vegetables. Vegetables in a cheese sauce.  Fruit (4 to 5 servings daily)  Eat More Often: All fresh, canned (in natural juice), or frozen fruits. Dried fruits without added sugar. One hundred percent fruit juice ( cup [237 mL] daily).  Eat Less Often: Dried fruits with added sugar. Canned fruit in light or heavy syrup.  [employer], Fish, and Poultry (2 servings or less daily. One serving is 3 to 4 oz [85-114 g]).  Eat More Often: Ninety percent or leaner ground beef, tenderloin, sirloin. Round cuts of beef, chicken breast, [place] breast. All fish. Grill, bake, or broil your meat. Nothing should be fried.  Eat Less Often/Avoid: Fatty cuts of meat, [place], or chicken leg, thigh, or wing. Fried cuts of meat or fish.  Dairy (2 to 3 servings)  Eat More Often: Low-fat or fat-free milk, low-fat plain or light yogurt, reduced-fat or part-skim cheese.  Eat Less Often/Avoid: Milk (whole, 2%, skim, or chocolate). Whole milk yogurt. Full-fat cheeses.  Nuts, Seeds, and Legumes (4 to 5 servings per week)  Eat  More Often: All without added salt.  Eat Less Often/Avoid: Salted nuts and seeds, canned beans with added salt.  Fats and Sweets (limited)  Eat More Often: Vegetable oils, tub margarines without trans fats, sugar-free gelatin. Mayonnaise and salad dressings.  Eat Less Often/Avoid: Coconut oils, palm oils, butter, stick margarine, cream, half and half, cookies, candy, pie.   ________________________________________________________________________  Smoking Cessation Tips 1-800-QUIT-NOW  This document explains the best ways for you to quit smoking and new treatments to help. It lists new medicines that can double or triple your chances of quitting and quitting for good. It also considers ways to avoid relapses and concerns you may have about quitting, including weight gain.   Nicotine: A Powerful Addiction If you have tried to quit smoking, you know how hard it can be. It is hard because nicotine is a very addictive drug. For some people, it can be as addictive as heroin or cocaine. Usually, people make 2 or 3 tries, or more, before finally being able to quit. Each  time you try to quit, you can learn about what helps and what hurts. Quitting takes hard work and a lot of effort, but you can quit smoking.   Quitting smoking is one of the most important things you will ever do You will live longer, feel better, and live better.  The impact on your body of quitting smoking is felt almost immediately:   Five keys to quitting: Studies have shown that these 5 steps will help you quit smoking and quit for good. You have the best chances of quitting if you use them together:   1. GET READY  Set a quit date.  Change your environment.  Get rid of ALL cigarettes, ashtrays, matches, and lighters in your home, car, and place of work.  Do not let people smoke in your home.  Review your past attempts to quit. Think about what worked and what did not.  Once you quit, do not smoke. NOT EVEN A PUFF!   2.  GET SUPPORT AND ENCOURAGEMENT  Tell your family, friends, and coworkers that you are going to quit and need their support. Ask them not to smoke around you.  Get individual, group, or telephone counseling and support.  Many smokers find one or more of the many self-help books available useful in helping them quit and stay off tobacco.   3. LEARN NEW SKILLS AND BEHAVIORS  Try to distract yourself from urges to smoke. Talk to someone, go for a walk, or occupy your time with a task.  When you first try to quit, change your routine. Take a different route to work. Drink tea instead of coffee. Eat breakfast in a different place.  Do something to reduce your stress. Take a hot bath, exercise, or read a book.  Plan something enjoyable to do every day. Reward yourself for not smoking.  Explore interactive web-based programs that specialize in helping you quit.   4. GET MEDICINE AND USE IT CORRECTLY .  Medicines can help you stop smoking and decrease the urge to smoke. Combining medicine with the above behavioral methods and support can quadruple your chances of successfully quitting smoking.  Talk with your doctor about these options.  5. BE PREPARED FOR RELAPSE OR DIFFICULT SITUATIONS  Most relapses occur within the first 3 months after quitting. Do not be discouraged if you start smoking again. Remember, most people try several times before they finally quit.  You may have symptoms of withdrawal because your body is used to nicotine. You may crave cigarettes, be irritable, feel very hungry, cough often, get headaches, or have difficulty concentrating.  The withdrawal symptoms are only temporary. They are strongest when you first quit, but they will go away within 10 to 14 days.   Quitting takes hard work and a lot of effort, but you can quit smoking.   FOR MORE INFORMATION  Smokefree.gov (http://www.davis-sullivan.com/) provides free, accurate, evidence-based information and professional assistance to  help support the immediate and long-term needs of people trying to quit smoking.  Document Released: 01/07/2001 Document Re-Released: 07/03/2009  [facility] Patient Information 2011 [place], [place].

## 2012-01-05 NOTE — Assessment & Plan Note (Signed)
Pertinent Data:  H/H:  Ref. Range 10/28/2011  12/29/2011  12/29/2011  12/30/2011  12/30/2011   Hgb Latest Range: 13.0-17.0 g/dL 9.7 (L) 8.9 (L) 9.2 (L) 13.4 8.5 (L)  HCT Latest Range: 39.0-52.0 % 30.3 (L) 27.8 (L) 27.0 (L) 39.4 26.1 (L)  MCV Latest Range: 78.0-100.0 fL 93.2 93.0  93.1 93.5   Anemia Panel:  Ref. 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 However, his low ferritin and percent sat raises concern for iron-deficiency component. This was previously thought 2/2 his antral ulcer, which has not been followed-up. However, lower GI source remains a concern, versus other differentials. Acutely, likely also contributed by his blood loss from his left foot wound - which notably has stabilized.  Plan:      Check CBC today.  Check anemia panel.  Have asked pt to increase iron to BID for now.  Have asked pt to increase his colace to BID to help avoid constipation (hold for loose stools).  GI follow-up.

## 2012-01-05 NOTE — Assessment & Plan Note (Addendum)
Pertinent Data:  FOBT (+) 12/29/2011  EGD (12/2010) - gastric antral ulcer, not actively bleeding. Repeat EGD in 6 weeks recommended.  12/2010 admission - patient was admitted for worsening anemia. This was thought to be 2/2 his CKD and possible acute on chronic component of antral ulcer. Patient was recommended, but refused colonoscopy at that time. He has not returned for GI follow-up.  Assessment: Cause remains unclear, however, may be related to his know antral ulcer versus lower GI source that was not previously identified (as pt refused colonoscopy), particularly given that ulcer was not actively bleeding at time of evaluation.  Plan:      GI referral for reevaluation, consideration for EGD (never had his 6week repeat EGD to assess healing of ulcer) and colonoscopy.  Check CBC and anemia panel today.  Continue Nexium.

## 2012-01-05 NOTE — Progress Notes (Addendum)
Patient: Adam [name]   MRN: [id number]  DOB: Jul 20, 1965    Subjective:    HPI: [name] is a 46 y.o. male with a PMHx of well controlled DM2, CKD3-4, NICM (EF 30% s/p defibrillator placement), who presented to clinic today for the following:  1) ER Follow-up - Patient evaluated at [facility] ER on December 31, 2011 for evaluation of left foot bleeding, which was determined to be secondary to patient aggressively scratching his varicose vein. He was evaluated overnight. Labs at that time indicated that he is persistently anemia (Hgb 8.5 slightly down from his prior baseline 8.5-9). Sutures were placed in the involved site, with cessation of bleeding. The patient was instructed to return in 4-5 days for suture removal. Since hospital discharge, patient does note significant improvement of symptoms - he has not had recurrence of bleeding symptoms. Denies lightheadedness, dizziness, profuse bleeding from any other sites. No worsening redness, edema, swelling around the involved site.   2) DM2, controlled -  Lab Results  Component Value Date   HGBA1C 7.2 11/12/2011   Patient checking blood sugars 1 times daily, before breakfast. Reports fasting blood sugars of 110-120 mg/dL. Currently taking Lantus 62 units daily. Denies assisted hypoglycemia or recently hospitalizations for either hyper or hypoglycemia. denies polyuria, polydipsia, nausea, vomiting, diarrhea.  does not request refills today.  In regards to diabetic complications:  Microvascular complications: Confirms: nephropathy, ?retinopathy (with dot/blot hemorrhages noted BL on last eye exam) ; Denies autonomic neuropathy and peripheral neuropathy.  Macrovascular complications: Confirms: none ; Denies cardiovascular disease, cerebrovascular disease and peripheral vascular disease.   Important diabetic medications: Is patient on aspirin? No Is patient on a statin? Yes Is patient on an ACE-I/ ARB? No     Review of Systems: Per  HPI.   Current Outpatient Medications: Current Outpatient Prescriptions  Medication Sig Dispense Refill  . ACCU-CHEK SOFTCLIX LANCETS lancets USE TO CHECK BLOOD SUGAR UP TO 1 TIME A DAY  100 each  3  . acetaminophen (TYLENOL) 325 MG tablet Take 650 mg by mouth every 6 (six) hours as needed. For pain      . docusate sodium (COLACE) 100 MG capsule Take 100 mg by mouth daily.       Marland Kitchen esomeprazole (NEXIUM) 20 MG capsule Take 1 capsule (20 mg total) by mouth daily before breakfast.  30 capsule  11  . fluticasone (FLONASE) 50 MCG/ACT nasal spray Place 1 spray into the nose daily as needed. For allergies      . insulin glargine (LANTUS) 100 UNIT/ML injection Inject 62 Units into the skin at bedtime.      Marland Kitchen loratadine (CLARITIN) 10 MG tablet TAKE 1 TABLET BY MOUTH DAILY  30 tablet  3  . LORazepam (ATIVAN) 0.5 MG tablet Take 0.5 mg by mouth 2 (two) times daily as needed. For anxiety      . metolazone (ZAROXOLYN) 10 MG tablet Take 10 mg by mouth daily.       . metoprolol (LOPRESSOR) 100 MG tablet TAKE 1 TABLET BY MOUTH TWICE A DAY  62 tablet  10  . simvastatin (ZOCOR) 40 MG tablet TAKE 1 TABLET BY MOUTH DAILY  30 tablet  11  . spironolactone (ALDACTONE) 25 MG tablet Take 1 tablet (25 mg total) by mouth daily.  31 tablet  11  . torsemide (DEMADEX) 100 MG tablet Take 100 mg by mouth 2 (two) times daily.        . traMADol (ULTRAM) 50 MG tablet  Take 2 tablets (100 mg total) by mouth every 6 (six) hours as needed for pain. Do not exceed 8 pills a day (400mg ).  120 tablet  1  . VOLTAREN 1 % GEL APPLY 1 APPLICATION TOPICALLY 4 TIMES A DAY  100 g  1  . ferrous sulfate 325 (65 FE) MG tablet Take 325 mg by mouth daily.          No Known Allergies  Past Medical History  Diagnosis Date  . Other and unspecified hyperlipidemia   . Insomnia, unspecified   . Obstructive sleep apnea (adult) (pediatric)   . Abdominal pain, left upper quadrant   . Allergic rhinitis, cause unspecified   . Other primary  cardiomyopathies   . Unspecified essential hypertension   . Type II or unspecified type diabetes mellitus without mention of complication, not stated as uncontrolled   . Chronic kidney disease, stage I   . Esophageal reflux   . Morbid obesity   . Dysuria   . Tobacco use disorder   . Dysrhythmia   . Pacemaker     Past Surgical History  Procedure Date  . Cardiac defibrillator placement 2011  . Esophagogastroduodenoscopy 01/03/2011    Procedure: ESOPHAGOGASTRODUODENOSCOPY (EGD);  Surgeon: Vertell Novak., MD;  Location: [facility] ENDOSCOPY;  Service: Endoscopy;  Laterality: N/A;     Objective:    Physical Exam: Filed Vitals:   01/05/12 0924  BP: 115/76  Pulse: 112  Temp: 97.4 F (36.3 C)      General: Vital signs reviewed and noted. Well-developed, well-nourished, in no acute distress; alert, appropriate and cooperative throughout examination.  Head: Normocephalic, atraumatic.  Lungs:  Normal respiratory effort. Clear to auscultation BL without crackles or wheezes.  Heart: RRR. S1 and S2 normal without gallop, rubs. No murmur.  Abdomen:  BS normoactive. Soft, Nondistended, non-tender.  No masses or organomegaly.  Extremities: BL pitting edema 1-2+. Left lower extremity with significant varicose veins noted. On lateral aspect of left dorsal foot, there are two sutures in place over his varicose vein that were recently bleeding. No overlying erythema, warmth.  After suture removal - two sutures were removed after cleaning area with betadine solution and alcohol, and using sterile equipment for removal. Immediately after removal, small amount of expelled blood resulted, that stopped after pressure dressing was applied.    Assessment/ Plan:   Case and plan of care discussed with attending physician, Dr. Aletta Edouard.     Greater than 40 minutes spent with the patient with > 50% of time spent in direct face-to-face counseling.

## 2012-01-05 NOTE — Assessment & Plan Note (Signed)
Assessment: Left lateral foot, site of his varicose vein was disrupted after scratching forcefully and tearing the skin. He had sutures placed in the ED on 12/31/2011, appears to be healing well. Dr. Dalphine Handing also examined the wound.  Plan:      Sutures removed today without complication.  Patient instructed to avoid scratching his varicose veins.  Patient instructed to return to clinic or ED if starts to bleed again or he starts to feel symptomatic. Patient expresses understanding.

## 2012-01-05 NOTE — Assessment & Plan Note (Signed)
Pertinent Data:  Ref. Range 01/30/2011  02/20/2011  10/28/2011  12/29/2011   Sodium Latest Range: 135-145 mEq/L 135 130 (L) 137 139  Potassium Latest Range: 3.5-5.1 mEq/L 3.8 4.2 3.5 3.8  Chloride Latest Range: 96-112 mEq/L 98 91 (L) 98 100  CO2 Latest Range: 19-32 mEq/L 23 23 22    BUN Latest Range: 6-23 mg/dL 83 (H) 87 (H) 69 (H) 098 (H)  Creatinine Latest Range: 0.50-1.35 mg/dL 1.19 (H) 1.47 (H) 8.29 (H) 3.40 (H)  Calcium Latest Range: 8.4-10.5 mg/dL 9.5 9.8 9.7   GFR calc AA Latest Range: >90 mL/min   28 (L)   Glucose Latest Range: 70-99 mg/dL 562 (H) 130 (H) 865 (H) 143 (H)  Calcium Ionized Latest Range: 1.12-1.23 mmol/L    1.09 (L)    Assessment: Patient follows with Dr. Lowell Guitar at [facility]. Unsure when he was last seen. Patient has recently been increasing his torsemide to help with LE edema.   Plan:      Will check CMET today, if worsening renal function, will ask to see. Dr. Lowell Guitar sooner and to resume taking torsemide only at previously prescribed doses.

## 2012-01-08 NOTE — Progress Notes (Signed)
Quick Note:  Patient has only marginal elevation of his alk phos and T. bili (which are decreasing from last evaluation in 10/2011). As well, he is asymptomatic at this time. Last CT abdomen in 2008 did indicate hepatomegaly without evidence of GB pathology. Therefore, will plan to repeat hepatic function tests at next visit. If Alk phos/ T. Bili remain elevated, will consider evaluation of GGT and possible RUQ ultrasound to evaluate further if indicated. Discussed with Dr. Eben Burow. ______

## 2012-01-08 NOTE — Progress Notes (Signed)
Quick Note:  Iron deficiency anemia, needs GI eval, as we discussed during our encounter - pending at this time. ______

## 2012-01-09 ENCOUNTER — Ambulatory Visit (INDEPENDENT_AMBULATORY_CARE_PROVIDER_SITE_OTHER): Payer: Medicaid Other | Admitting: Internal Medicine

## 2012-01-09 VITALS — BP 119/75 | HR 84 | Wt 317.0 lb

## 2012-01-09 DIAGNOSIS — M25579 Pain in unspecified ankle and joints of unspecified foot: Secondary | ICD-10-CM

## 2012-01-09 DIAGNOSIS — M79609 Pain in unspecified limb: Secondary | ICD-10-CM

## 2012-01-09 DIAGNOSIS — I509 Heart failure, unspecified: Secondary | ICD-10-CM

## 2012-01-09 DIAGNOSIS — M79645 Pain in left finger(s): Secondary | ICD-10-CM

## 2012-01-09 NOTE — Patient Instructions (Signed)
Your physician has requested that you have an echocardiogram. Echocardiography is a painless test that uses sound waves to create images of your heart. It provides your doctor with information about the size and shape of your heart and how well your heart's chambers and valves are working. This procedure takes approximately one hour. There are no restrictions for this procedure.  Schedule in Jan 2014  XRAY of right ankle and left index finger   Your physician wants you to follow-up in:6 months You will receive a reminder letter in the mail two months in advance. If you don't receive a letter, please call our office to schedule the follow-up appointment.

## 2012-01-09 NOTE — Progress Notes (Signed)
HPI Patient is a 46 yo with a history of NICM (LVEF 30%), HTN, CRI  I saw him in March Since seen he continues to f/u in renal clinic.  COmplains of pain in L knee, R ankle and L hand (finger)  Tramadol is not helping.  Injection of knee helped.  Breathing has been ok.  No CP  Lipids  56  HLD 29   (jan 2013) Seen in ER recently for bleeding varicose vein in L leg  No Known Allergies  Current Outpatient Prescriptions  Medication Sig Dispense Refill  . ACCU-CHEK SOFTCLIX LANCETS lancets USE TO CHECK BLOOD SUGAR UP TO 1 TIME A DAY  100 each  3  . acetaminophen (TYLENOL) 325 MG tablet Take 650 mg by mouth every 6 (six) hours as needed. For pain      . docusate sodium (COLACE) 100 MG capsule Take 100 mg by mouth daily.       Marland Kitchen esomeprazole (NEXIUM) 20 MG capsule Take 1 capsule (20 mg total) by mouth daily before breakfast.  30 capsule  11  . ferrous sulfate 325 (65 FE) MG tablet Take 1 tablet (325 mg total) by mouth 2 (two) times daily with breakfast and lunch.  60 tablet  2  . fluticasone (FLONASE) 50 MCG/ACT nasal spray Place 1 spray into the nose daily as needed. For allergies      . insulin glargine (LANTUS) 100 UNIT/ML injection Inject 62 Units into the skin at bedtime.      Marland Kitchen loratadine (CLARITIN) 10 MG tablet Take 1 tablet (10 mg total) by mouth daily.  30 tablet  3  . LORazepam (ATIVAN) 0.5 MG tablet Take 0.5 mg by mouth 2 (two) times daily as needed. For anxiety      . metolazone (ZAROXOLYN) 10 MG tablet Take 10 mg by mouth daily.       . metoprolol (LOPRESSOR) 100 MG tablet TAKE 1 TABLET BY MOUTH TWICE A DAY  62 tablet  10  . simvastatin (ZOCOR) 40 MG tablet TAKE 1 TABLET BY MOUTH DAILY  30 tablet  11  . spironolactone (ALDACTONE) 25 MG tablet Take 1 tablet (25 mg total) by mouth daily.  31 tablet  11  . torsemide (DEMADEX) 100 MG tablet Take 100 mg by mouth 2 (two) times daily.        . traMADol (ULTRAM) 50 MG tablet Take 2 tablets (100 mg total) by mouth every 6 (six) hours as  needed for pain. Do not exceed 8 pills a day (400mg ).  120 tablet  1  . VOLTAREN 1 % GEL APPLY 1 APPLICATION TOPICALLY 4 TIMES A DAY  100 g  1    Past Medical History  Diagnosis Date  . Other and unspecified hyperlipidemia   . Insomnia, unspecified   . Obstructive sleep apnea (adult) (pediatric)   . Allergic rhinitis, cause unspecified   . Nonischemic cardiomyopathy   . Unspecified essential hypertension   . Type II or unspecified type diabetes mellitus without mention of complication, not stated as uncontrolled   . CKD (chronic kidney disease) stage 3, GFR 30-59 ml/min   . Esophageal reflux   . Morbid obesity   . Dysuria   . Tobacco use disorder   . Dysrhythmia   . Pacemaker   . Antral ulcer     Past Surgical History  Procedure Date  . Cardiac defibrillator placement 2011  . Esophagogastroduodenoscopy 01/03/2011    Procedure: ESOPHAGOGASTRODUODENOSCOPY (EGD);  Surgeon: Vertell Novak., MD;  Location: [facility] ENDOSCOPY;  Service: Endoscopy;  Laterality: N/A;    Family History  Problem Relation Age of Onset  . Diabetes Mother   . Microcephaly Father   . Lung cancer Father     History   Social History  . Marital Status: Single    Spouse Name: N/A    Number of Children: N/A  . Years of Education: N/A   Occupational History  . Not on file.   Social History Main Topics  . Smoking status: Current Every Day Smoker -- 0.3 packs/day    Types: Cigarettes  . Smokeless tobacco: Never Used     Comment: started smoking at age 62. Will stop on his own - cutting back now- was smoking a ppd - now only 3 a day  . Alcohol Use: 0.0 oz/week    0 Cans of beer per week  . Drug Use: No  . Sexually Active: Yes   Other Topics Concern  . Not on file   Social History Narrative  . No narrative on file    Review of Systems:  All systems reviewed.  They are negative to the above problem except as previously stated.  Vital Signs: BP 119/75  Pulse 84  Wt 317 lb (143.79  kg)  Physical Exam  HEENT:  Normocephalic, atraumatic. EOMI, PERRLA.  Neck: JVP is normal.  No bruits.  Lungs: clear to auscultation. No rales no wheezes.  Heart: Regular rate and rhythm. Normal S1, S2. No S3.   No significant murmurs. PMI not displaced.  Abdomen:  Supple, nontender. Normal bowel sounds. No masses. No hepatomegaly.  Extremities:   Good distal pulses throughout. No lower extremity edema.  Musculoskeletal :moving all extremities.  Neuro:   alert and oriented x3.  CN II-XII grossly intact.   Assessment and Plan:

## 2012-01-14 ENCOUNTER — Other Ambulatory Visit: Payer: Self-pay | Admitting: Internal Medicine

## 2012-01-14 ENCOUNTER — Ambulatory Visit
Admission: RE | Admit: 2012-01-14 | Discharge: 2012-01-14 | Disposition: A | Discharge status: Home / Self Care | Payer: Medicaid Other | Source: Ambulatory Visit | Attending: Internal Medicine | Admitting: Internal Medicine

## 2012-01-14 ENCOUNTER — Other Ambulatory Visit (HOSPITAL_COMMUNITY): Payer: Medicaid Other

## 2012-01-14 ENCOUNTER — Telehealth: Payer: Self-pay | Admitting: *Deleted

## 2012-01-14 DIAGNOSIS — M25579 Pain in unspecified ankle and joints of unspecified foot: Secondary | ICD-10-CM

## 2012-01-14 DIAGNOSIS — M79645 Pain in left finger(s): Secondary | ICD-10-CM

## 2012-01-14 NOTE — Telephone Encounter (Signed)
Call to [facility] for Prior Authorization on Nexium.  Approved 01/14/2012 thru 01/08/2013.  Pt has tried and failed Prilosec and Protonix.  Approval #[id number] P.  Called to [facility].  Angelina Ok, RN 01/14/2012 1:51 PM.

## 2012-01-15 ENCOUNTER — Telehealth: Payer: Self-pay | Admitting: Internal Medicine

## 2012-01-15 NOTE — Telephone Encounter (Signed)
New Problem:    Patient called in wanting to know the results of his most recent X-ray.  Please call back.

## 2012-01-15 NOTE — Telephone Encounter (Signed)
Called patient with xray results.

## 2012-01-20 ENCOUNTER — Ambulatory Visit (HOSPITAL_COMMUNITY): Payer: Medicaid Other | Attending: Cardiology | Admitting: Radiology

## 2012-01-20 DIAGNOSIS — I5020 Unspecified systolic (congestive) heart failure: Secondary | ICD-10-CM | POA: Insufficient documentation

## 2012-01-20 DIAGNOSIS — I079 Rheumatic tricuspid valve disease, unspecified: Secondary | ICD-10-CM | POA: Insufficient documentation

## 2012-01-20 DIAGNOSIS — I428 Other cardiomyopathies: Secondary | ICD-10-CM

## 2012-01-20 DIAGNOSIS — I059 Rheumatic mitral valve disease, unspecified: Secondary | ICD-10-CM | POA: Insufficient documentation

## 2012-01-20 DIAGNOSIS — E119 Type 2 diabetes mellitus without complications: Secondary | ICD-10-CM | POA: Insufficient documentation

## 2012-01-20 DIAGNOSIS — I517 Cardiomegaly: Secondary | ICD-10-CM | POA: Insufficient documentation

## 2012-01-20 DIAGNOSIS — F17200 Nicotine dependence, unspecified, uncomplicated: Secondary | ICD-10-CM | POA: Insufficient documentation

## 2012-01-20 DIAGNOSIS — I509 Heart failure, unspecified: Secondary | ICD-10-CM

## 2012-01-20 DIAGNOSIS — I10 Essential (primary) hypertension: Secondary | ICD-10-CM | POA: Insufficient documentation

## 2012-01-20 NOTE — Progress Notes (Signed)
Echocardiogram performed.  

## 2012-01-30 ENCOUNTER — Telehealth: Payer: Self-pay | Admitting: Internal Medicine

## 2012-01-30 NOTE — Telephone Encounter (Signed)
New Problem:    Patient's neighbor called wanting to request that the patient's most recent X-rays be faxed or sent to [facility].  Please call back.

## 2012-01-30 NOTE — Telephone Encounter (Signed)
Called Clydie Braun (patient's friend/neighbor). He would like his xray reports sent to Dr.Whitfield for upcoming appointment. Advised her will send thru computer system.

## 2012-02-02 ENCOUNTER — Ambulatory Visit (INDEPENDENT_AMBULATORY_CARE_PROVIDER_SITE_OTHER): Payer: Medicaid Other | Admitting: Sports Medicine

## 2012-02-02 VITALS — BP 113/80 | Ht 73.0 in | Wt 311.0 lb

## 2012-02-02 DIAGNOSIS — M2550 Pain in unspecified joint: Secondary | ICD-10-CM

## 2012-02-03 ENCOUNTER — Telehealth: Payer: Self-pay | Admitting: Sports Medicine

## 2012-02-03 LAB — HEPATITIS B SURFACE ANTIBODY,QUALITATIVE: Hep B S Ab: NONREACTIVE

## 2012-02-03 NOTE — Progress Notes (Signed)
  Subjective:    Patient ID: Adam [name], male    DOB: Nov 16, 1965, 47 y.o.   MRN: [id number]  HPI  Patient comes in today complaining of pain in multiple joints. He was previously seen for left knee pain which resolved with a cortisone injection. Today he is having pain in his right ankle but he states that every few days the pain will migrate from one joint to another. He's had pain in his right knee, right ankle, and both wrists. He notices intermittent swelling along with the pain. No history of gout. No history of systemic arthropathy. He denies any fevers or chill. Denies any trauma.    Review of Systems     Objective:   Physical Exam Obese 47 year old male. No acute distress  Patient has noticeable lymphedema of the right leg. Right knee exam is somewhat limited by his lymphedema. Range of motion 0-90. No appreciable effusion. No erythema. Mild diffuse tenderness to palpation. Right ankle: There may be a mild effusion this is difficult to evaluate due to to his overlying edema. He is diffusely tender to palpation. Joint is slightly warm to touch but not erythematous. He has limited range of motion. And walks with a severe limp.  X-rays of his right ankle were done recently. They're only mild degenerative changes seen. Nothing acute.       Assessment & Plan:  1. Migratory polyarthralgia of unknown etiology  Going to start with some basic blood work. He has recently had a CBC and CMP. I will order a rate, serum uric acid, rheumatoid factor, ANA, hepatitis B and hepatitis C antibody. Patient will followup with me in one week. At this point I am not overly suspicious that his symptoms are related to osteoarthritis. We will delineate further treatment based on his laboratory findings. In the meantime, he has Ultram to take for pain. He can take 50 mg twice a day when necessary.

## 2012-02-03 NOTE — Telephone Encounter (Signed)
I called Adam [name] today after reviewing his blood work. He has an extremely high uric acid level of 15.7 and a sed rate of 125. It appears as though his migratory polyarthralgia is from gout. He will likely need to have his acute flare managed with colcrys and at some point we'll need to start Uloric. I recommended that the patient return to his PCP discuss this further. He has multiple medical problems including renal insufficiency which may make this challenging. Follow up with me PRN.

## 2012-02-10 ENCOUNTER — Ambulatory Visit (INDEPENDENT_AMBULATORY_CARE_PROVIDER_SITE_OTHER): Payer: Medicaid Other | Admitting: Internal Medicine

## 2012-02-10 ENCOUNTER — Encounter: Payer: Self-pay | Admitting: Internal Medicine

## 2012-02-10 VITALS — BP 120/83 | HR 129 | Temp 97.5°F | Ht 73.0 in | Wt 317.4 lb

## 2012-02-10 DIAGNOSIS — M1380 Other specified arthritis, unspecified site: Secondary | ICD-10-CM | POA: Insufficient documentation

## 2012-02-10 DIAGNOSIS — E119 Type 2 diabetes mellitus without complications: Secondary | ICD-10-CM

## 2012-02-10 DIAGNOSIS — I00 Rheumatic fever without heart involvement: Secondary | ICD-10-CM

## 2012-02-10 DIAGNOSIS — M109 Gout, unspecified: Secondary | ICD-10-CM

## 2012-02-10 DIAGNOSIS — Z79899 Other long term (current) drug therapy: Secondary | ICD-10-CM

## 2012-02-10 LAB — GLUCOSE, CAPILLARY: Glucose-Capillary: 143 mg/dL — ABNORMAL HIGH (ref 70–99)

## 2012-02-10 LAB — POCT GLYCOSYLATED HEMOGLOBIN (HGB A1C): Hemoglobin A1C: 7

## 2012-02-10 MED ORDER — COLCHICINE 0.6 MG PO TABS: ORAL_TABLET | ORAL | Status: DC

## 2012-02-10 NOTE — Assessment & Plan Note (Addendum)
Patient in unbearable pain due to subacute onset of swelling and tenderness of multiple joints including right ankle, right knee, right-handed right elbow. Symptoms have resolved and ankle and knee. Multiple labs drawn at sports medicine visit (ANA, RF, ESR, uric acid, hepatitis panel) were notable for sedimentation rate of 124 and uric acid of 15. Lab values are concerning for acute gout, although his presentation is atypical and we do not have joint aspirate to confirm crystals. He certainly does appear to have an acute, inflammatory polyarthritis. Low concern for septic joint. Patient does not have systemic symptoms. Also denies history of STI's, unprotected sex, or symptoms of urethritis. Patient refuses joint aspiration today. - Due to highest suspicion for acute gout flare, will treat with colchicine 1.2 mg x1 followed by 0.6 mg an hour. Patient does have chronic kidney disease with estimated GFR of 24, and as such is not a candidate for NSAID therapy. Renal dosing for colchicine and an acute flare does not require adjustment, although he should not take this medication long-term. - Instructed him to call me tomorrow afternoon. If no abatement in symptoms, will call in a prednisone burst for him. Patient was warned that this will worsen his hyperglycemia. -Patient reported that his neighbor made appointment for him with a "gout specialist " for Friday. He is unsure who the physician is. I told patient that I will be happy to make a referral for rheumatologist to evaluate migratory polyarthralgias and suspected atypical gout symptoms. -He is on diuretic therapy which can exacerbate gout; however with his poor ejection fraction and lower extremity edema, I'm going to continue these medications to avoid the consequences of volume overload in this patient

## 2012-02-10 NOTE — Patient Instructions (Addendum)
1. Take 2 tablets of colchicine as soon as you get to the pharmacy. Wait ONE HOUR and then take final tablet. 2. Call me in 1-2 days if you are not feeling any better and I will prescribe steroid therapy for you.  3. We are referring you to a joint specialist.

## 2012-02-10 NOTE — Assessment & Plan Note (Signed)
Good control, compliant with medications, last A1c 7.0. -Repeat A1c and followup in March

## 2012-02-10 NOTE — Progress Notes (Signed)
Patient ID: Adam [name], male   DOB: 07-08-1965, 47 y.o.   MRN: [id number]  Subjective:   Patient ID: Adam [name] male   DOB: April 05, 1965 47 y.o.   MRN: [id number]  HPI: Adam [name] is a 47 y.o. male with past medical history of CHF with ejection fraction 25%, obstructive sleep apnea, hypertension, and type 2 diabetes presenting with complaints of multiple joint pains. Earlier in the month, he saw Dr. Margaretha Sheffield of sports medicine with complaints of multiple joint pains. In the past, he saw Dr. Margaretha Sheffield for knee osteoarthritis. This month, he began to notice swelling and pain of right ankle for a couple of days. This pain and swelling resolved, but a couple of days later his right hand began to swell and joints were painful. His right hand has remained swollen, now he reports several days of right elbow pain and swelling. Dr. Margaretha Sheffield evaluated the patient and ordered ANA, uric acid level, sedimentation rate, rheumatoid factor, and hepatitis serologies at the time of evaluation. His sedimentation rate returned at 124 and uric acid level was elevated at 15. Patient reports to Dr. Margaretha Sheffield called him with concerned that he has acute gout, recommended he followup with me in the clinic. Today, patient reports pain in right hand and right elbow are exquisite, 10 out of 10. He can only bend right hand and elbow minimally because of the pain and swelling. He says that he no longer has any pain or swelling in his right ankle or right knee. Denies involvement of other joints. He's never experienced these symptoms before. He denies systemic symptoms such as fever, chills, or dizziness. He denies any dysuria or urethral discharge. He denies any unprotected sexual intercourse or history of STI's. Today, he has no chest pain, shortness of breath, nausea, vomiting, diarrhea, blood in urine or stool, constipation, orthopnea, or increase in his baseline lower extremity edema.    Past Medical History  Diagnosis Date  . Other and  unspecified hyperlipidemia   . Insomnia, unspecified   . Obstructive sleep apnea (adult) (pediatric)   . Allergic rhinitis, cause unspecified   . Nonischemic cardiomyopathy   . Unspecified essential hypertension   . Type II or unspecified type diabetes mellitus without mention of complication, not stated as uncontrolled   . CKD (chronic kidney disease) stage 3, GFR 30-59 ml/min   . Esophageal reflux   . Morbid obesity   . Dysuria   . Tobacco use disorder   . Dysrhythmia   . Pacemaker   . Antral ulcer    Current Outpatient Prescriptions  Medication Sig Dispense Refill  . ACCU-CHEK SOFTCLIX LANCETS lancets USE TO CHECK BLOOD SUGAR UP TO 1 TIME A DAY  100 each  3  . acetaminophen (TYLENOL) 325 MG tablet Take 650 mg by mouth every 6 (six) hours as needed. For pain      . colchicine 0.6 MG tablet Take 2 tablets immediately. WAIT ONE HOUR and take one more tablet  3 tablet  0  . docusate sodium (COLACE) 100 MG capsule Take 100 mg by mouth daily.       Marland Kitchen esomeprazole (NEXIUM) 20 MG capsule Take 1 capsule (20 mg total) by mouth daily before breakfast.  30 capsule  11  . ferrous sulfate 325 (65 FE) MG tablet Take 1 tablet (325 mg total) by mouth 2 (two) times daily with breakfast and lunch.  60 tablet  2  . fluticasone (FLONASE) 50 MCG/ACT nasal spray Place 1 spray into the nose daily  as needed. For allergies      . insulin glargine (LANTUS) 100 UNIT/ML injection Inject 62 Units into the skin at bedtime.      Marland Kitchen loratadine (CLARITIN) 10 MG tablet Take 1 tablet (10 mg total) by mouth daily.  30 tablet  3  . LORazepam (ATIVAN) 0.5 MG tablet Take 0.5 mg by mouth 2 (two) times daily as needed. For anxiety      . metolazone (ZAROXOLYN) 10 MG tablet Take 10 mg by mouth daily.       . metoprolol (LOPRESSOR) 100 MG tablet TAKE 1 TABLET BY MOUTH TWICE A DAY  62 tablet  10  . simvastatin (ZOCOR) 40 MG tablet TAKE 1 TABLET BY MOUTH DAILY  30 tablet  11  . spironolactone (ALDACTONE) 25 MG tablet Take 1  tablet (25 mg total) by mouth daily.  31 tablet  11  . torsemide (DEMADEX) 100 MG tablet Take 100 mg by mouth 2 (two) times daily.        . traMADol (ULTRAM) 50 MG tablet Take 2 tablets (100 mg total) by mouth every 6 (six) hours as needed for pain. Do not exceed 8 pills a day (400mg ).  120 tablet  1  . VOLTAREN 1 % GEL APPLY 1 APPLICATION TOPICALLY 4 TIMES A DAY  100 g  1   Family History  Problem Relation Age of Onset  . Diabetes Mother   . Microcephaly Father   . Lung cancer Father    History   Social History  . Marital Status: Single    Spouse Name: N/A    Number of Children: N/A  . Years of Education: N/A   Social History Main Topics  . Smoking status: Current Every Day Smoker -- 0.3 packs/day    Types: Cigarettes  . Smokeless tobacco: Never Used     Comment: started smoking at age 72. Will stop on his own - cutting back now- was smoking a ppd - now only 3 a day  . Alcohol Use: 0.0 oz/week    0 Cans of beer per week  . Drug Use: No  . Sexually Active: Yes   Other Topics Concern  . None   Social History Narrative  . None   Review of Systems: 10 pt ROS performed, pertinent positives and negatives noted in HPI   Objective:  Physical Exam: Filed Vitals:   02/10/12 1326  BP: 120/83  Pulse: 129  Temp: 97.5 F (36.4 C)  TempSrc: Oral  Height: 6\' 1"  (1.854 m)  Weight: 317 lb 6.4 oz (143.972 kg)  SpO2: 96%   Constitutional: Vital signs reviewed.  Patient is an obese male appearing uncomfortable. Head: Normocephalic and atraumatic Mouth: no erythema or exudates, MMM Eyes: PERRL, EOMI, No scleral icterus.  Neck: No lymphadenopathy Cardiovascular: RRR, S1 normal, S2 normal, no MRG, pulses symmetric and intact bilaterally Pulmonary/Chest: CTAB, no wheezes, rales, or rhonchi Abdominal: Soft. Non-tender, non-distended, bowel sounds are normal, no masses, organomegaly, or guarding present.  GU: no CVA tenderness Musculoskeletal:  Swelling involving the soft tissues  and joints of the hands including effusions of the MCP, PIP, and DIP joints. Patient not able to make fist due to pain and swelling. Moderate right elbow effusion noted. No involvement of bilateral lower extremities.  Extremities: 1+ bilateral pitting edema to the knee.  Neurological: A&O x3, Strength is normal and symmetric bilaterally, cranial nerve II-XII are grossly intact, no focal motor deficit. Sensation of R hand intact.  Skin: Warm, dry and intact.  No rash, cyanosis, or clubbing.  Psychiatric: Normal mood and affect. speech and behavior is normal. Judgment and thought content normal. Cognition and memory are normal.   Assessment & Plan:

## 2012-02-11 LAB — MICROALBUMIN / CREATININE URINE RATIO
Creatinine, Urine: 107.6 mg/dL
Microalb Creat Ratio: 179.5 mg/g — ABNORMAL HIGH (ref 0.0–30.0)
Microalb, Ur: 19.31 mg/dL — ABNORMAL HIGH (ref 0.00–1.89)

## 2012-02-11 LAB — BASIC METABOLIC PANEL WITH GFR
BUN: 71 mg/dL — ABNORMAL HIGH (ref 6–23)
CO2: 25 mEq/L (ref 19–32)
Calcium: 9.2 mg/dL (ref 8.4–10.5)
Chloride: 99 mEq/L (ref 96–112)
Potassium: 4 mEq/L (ref 3.5–5.3)
Sodium: 137 mEq/L (ref 135–145)

## 2012-02-12 ENCOUNTER — Ambulatory Visit: Payer: Medicaid Other | Admitting: Sports Medicine

## 2012-02-13 ENCOUNTER — Other Ambulatory Visit: Payer: Self-pay | Admitting: Internal Medicine

## 2012-02-13 ENCOUNTER — Telehealth: Payer: Self-pay | Admitting: *Deleted

## 2012-02-13 DIAGNOSIS — M109 Gout, unspecified: Secondary | ICD-10-CM

## 2012-02-13 MED ORDER — ALLOPURINOL 100 MG PO TABS: ORAL_TABLET | ORAL | Status: DC

## 2012-02-13 NOTE — Telephone Encounter (Signed)
Called and spoke w patient  He had orthopedics appt today and was told his diagnosis was gout. Pt reports MD told him there was no tappable effusion for joint aspirate.  He tells me symptoms are better today. He will need urate lowering therapy for gout prophylaxis in the setting of his chronic renal insufficiency. Prophylaxis to be initiated 2 weeks after acute flare. I sent a Rx for allopurinol renally dosed to his pharmacy with fill date of 02/27/12. In the meantime, if recurrence of flare, he will likely need prednisone burst as colchicine dose should not be repeated within 2 weeks. I instructed him to call clinic if he has a recurrence of flare to make an appointment if possible as we need a joint aspirate for crystal diagnosis.  Thank you Bronson Curb, MD  PGY-1, Internal Medicine Resident Pager: [phone] (7AM-5PM) 02/13/2012, 4:12 PM

## 2012-02-13 NOTE — Telephone Encounter (Signed)
Pt went for his appt, it is gout, dr told him that he would prefer that pt's medical doctors prescribe the appropriate pain meds due to his kidney problems, please advise, you may call pt at ph# on snapshot

## 2012-02-23 ENCOUNTER — Telehealth: Payer: Self-pay | Admitting: *Deleted

## 2012-02-23 NOTE — Telephone Encounter (Signed)
Call to patient this am after receiving call from Viviana Simpler a friend.  Pt wanted to know the name of the doctor that he had been referred to for his Gout.  Pt was told that Dr. Lendon Colonel had agreed to see him last Thursday and that he had said he was unable to go.for the appointment.  Pt's friend had mentioned Dr. Corliss Skains , whom she had seen.  Pt also wanted a prescription for Diabetic Shoes after his friend asked if he could get them.  Informed pt and friend that a prescription can be given to patient to get the Diabetic Shoes.  Patient also wants to get the prescription for his Gout medication earlier than 02/27/2012.  Pt said he was going to the pharmacy and wanted to get all of his meds at once.  Pt was informed that I will contact Dr. Loura Pardon about a prescription for his Diabetic meds and getting his Gout medication early.   Call to Dr. Fatima Sanger office to schedule patient with an appointment.  Dr. Fatima Sanger office does not take Medicaid.    Return call to Dr. Lendon Colonel office spoke to Verlee Monte who said that she had tried to call pt to schedule an appointment after she received message that pt could not come on last Thursday 02/19/2012.  Pt has been rescheduled for 04/09/2012 at 3:30 PM.  Pt was again called and given appointment time and date.  Pt voiced understanding of the appointment.  Angelina Ok, RN 02/23/2012 11:59 AM.

## 2012-02-27 ENCOUNTER — Telehealth: Payer: Self-pay | Admitting: *Deleted

## 2012-02-27 NOTE — Telephone Encounter (Signed)
Call from pt said that he has started his Allopurinol 1/2 tablet on yesterday.  Py said that he experienced a lot of pain and had too take Tylenol for relief.  Wanted to know if he can continue to take the Tylenol along with the Allopurinol.  Dr. Criselda Peaches was consulted.  Ok for pt to take the Tylenol with the Allopurinol and that the pain reaction is normal.  Pt was informed that he could continue the Tylenol not to exceed 4 Extra Strength tablets per day.  Pt voiced understanding of.  Pt was also informed that if the pain continues and is not eased with the Tylenol to call back  And we will try something else.  Pt again voiced an understanding of the plan.  Angelina Ok, RN 02/27/2012 10:40 AM.

## 2012-03-02 ENCOUNTER — Ambulatory Visit (INDEPENDENT_AMBULATORY_CARE_PROVIDER_SITE_OTHER): Payer: Medicaid Other | Admitting: Internal Medicine

## 2012-03-02 ENCOUNTER — Encounter: Payer: Self-pay | Admitting: Internal Medicine

## 2012-03-02 VITALS — BP 111/71 | HR 146 | Temp 97.0°F | Ht 73.0 in | Wt 319.1 lb

## 2012-03-02 DIAGNOSIS — M109 Gout, unspecified: Secondary | ICD-10-CM | POA: Insufficient documentation

## 2012-03-02 DIAGNOSIS — J349 Unspecified disorder of nose and nasal sinuses: Secondary | ICD-10-CM | POA: Insufficient documentation

## 2012-03-02 DIAGNOSIS — E119 Type 2 diabetes mellitus without complications: Secondary | ICD-10-CM

## 2012-03-02 DIAGNOSIS — J329 Chronic sinusitis, unspecified: Secondary | ICD-10-CM

## 2012-03-02 LAB — BASIC METABOLIC PANEL
BUN: 90 mg/dL — ABNORMAL HIGH (ref 6–23)
CO2: 25 mEq/L (ref 19–32)
Chloride: 95 mEq/L — ABNORMAL LOW (ref 96–112)
Creat: 3.12 mg/dL — ABNORMAL HIGH (ref 0.50–1.35)

## 2012-03-02 LAB — GLUCOSE, CAPILLARY: Glucose-Capillary: 196 mg/dL — ABNORMAL HIGH (ref 70–99)

## 2012-03-02 MED ORDER — PREDNISONE (PAK) 10 MG PO TABS: 10.0000 mg | ORAL_TABLET | Freq: Every day | ORAL | Status: DC

## 2012-03-02 NOTE — Patient Instructions (Addendum)
1. Please call the clinic if the pain in the hand will not get better within 2 days . Phone [phone] 2. please call the clinic if you are experiencing worsening sinus symptoms,  cough and fever.

## 2012-03-02 NOTE — Assessment & Plan Note (Signed)
Patient is experiencing acute gout flare. I will start patient on a prednisone taper and recommend to continue allopurinol. I will obtain renal function today. I believe patient would benefit of colchicine 0.6 mg daily along with allopurinol to decrease gout flare ups  . He needs close monitoring of any side effects.

## 2012-03-02 NOTE — Progress Notes (Signed)
Subjective:   Patient ID: Adam [name] male   DOB: 28-Nov-1965 47 y.o.   MRN: [id number]  HPI: [name] is a 47 y.o. male with past medical history significant as outlined below who presented to the clinic with nasal congestion and hand swelling. 1. had swelling: Patient reports that he started to take allopurinol last Wednesday and since then he has been experiencing significant pain in his right hand which has been worsening since then. He further noticed body aches. Is trying to take Tylenol and tramadol to ease the pain but at this point its almost unbearable.  2. upper respiratory symptoms since 2 weeks but at this point improving. He reports he has been using nasal decongestion and since then his symptoms including nasal congestion and cough has significantly improved. Denies any shortness of breath, fevers or chills.    Past Medical History  Diagnosis Date  . Other and unspecified hyperlipidemia   . Insomnia, unspecified   . Obstructive sleep apnea (adult) (pediatric)   . Allergic rhinitis, cause unspecified   . Nonischemic cardiomyopathy   . Unspecified essential hypertension   . Type II or unspecified type diabetes mellitus without mention of complication, not stated as uncontrolled   . CKD (chronic kidney disease) stage 3, GFR 30-59 ml/min   . Esophageal reflux   . Morbid obesity   . Dysuria   . Tobacco use disorder   . Dysrhythmia   . Pacemaker   . Antral ulcer    Current Outpatient Prescriptions  Medication Sig Dispense Refill  . ACCU-CHEK SOFTCLIX LANCETS lancets USE TO CHECK BLOOD SUGAR UP TO 1 TIME A DAY  100 each  3  . acetaminophen (TYLENOL) 325 MG tablet Take 650 mg by mouth every 6 (six) hours as needed. For pain      . allopurinol (ZYLOPRIM) 100 MG tablet Take one-half of a tablet daily to prevent gout attacks.  15 tablet  1  . colchicine 0.6 MG tablet Take 2 tablets immediately. WAIT ONE HOUR and take one more tablet  3 tablet  0  . docusate sodium (COLACE)  100 MG capsule Take 100 mg by mouth daily.       Marland Kitchen esomeprazole (NEXIUM) 20 MG capsule Take 1 capsule (20 mg total) by mouth daily before breakfast.  30 capsule  11  . ferrous sulfate 325 (65 FE) MG tablet Take 1 tablet (325 mg total) by mouth 2 (two) times daily with breakfast and lunch.  60 tablet  2  . fluticasone (FLONASE) 50 MCG/ACT nasal spray Place 1 spray into the nose daily as needed. For allergies      . insulin glargine (LANTUS) 100 UNIT/ML injection Inject 62 Units into the skin at bedtime.      Marland Kitchen loratadine (CLARITIN) 10 MG tablet Take 1 tablet (10 mg total) by mouth daily.  30 tablet  3  . LORazepam (ATIVAN) 0.5 MG tablet Take 0.5 mg by mouth 2 (two) times daily as needed. For anxiety      . metolazone (ZAROXOLYN) 10 MG tablet Take 10 mg by mouth daily.       . metoprolol (LOPRESSOR) 100 MG tablet TAKE 1 TABLET BY MOUTH TWICE A DAY  62 tablet  10  . predniSONE (STERAPRED UNI-PAK) 10 MG tablet Take 1 tablet (10 mg total) by mouth daily. Take 40 mg today, take 30 mg tomorrow ( on 2/5), Take 20 mg  on 2/6 , Take 10 mg  2/7 and  2/8.  11 tablet  0  . simvastatin (ZOCOR) 40 MG tablet TAKE 1 TABLET BY MOUTH DAILY  30 tablet  11  . spironolactone (ALDACTONE) 25 MG tablet Take 1 tablet (25 mg total) by mouth daily.  31 tablet  11  . torsemide (DEMADEX) 100 MG tablet Take 100 mg by mouth 2 (two) times daily.        . traMADol (ULTRAM) 50 MG tablet Take 2 tablets (100 mg total) by mouth every 6 (six) hours as needed for pain. Do not exceed 8 pills a day (400mg ).  120 tablet  1  . VOLTAREN 1 % GEL APPLY 1 APPLICATION TOPICALLY 4 TIMES A DAY  100 g  1   Family History  Problem Relation Age of Onset  . Diabetes Mother   . Microcephaly Father   . Lung cancer Father    History   Social History  . Marital Status: Single    Spouse Name: N/A    Number of Children: N/A  . Years of Education: N/A   Social History Main Topics  . Smoking status: Current Every Day Smoker -- 0.3 packs/day     Types: Cigarettes  . Smokeless tobacco: Never Used     Comment: started smoking at age 75. Will stop on his own - cutting back now- was smoking a ppd - now only 3 a day  . Alcohol Use: No     Comment: No alcohol x 1 month.  . Drug Use: No  . Sexually Active: None   Other Topics Concern  . None   Social History Narrative  . None   Review of Systems: Constitutional: Denies fever, chills, diaphoresis, appetite change and fatigue.  HEENT: Denies ear pain or  trouble swallowing, but noted some congestion and sore throat Respiratory: Denies SOB, DOE, cough, chest tightness,  and wheezing.   Cardiovascular: Denies chest pain, palpitations and leg swelling.  Gastrointestinal: Denies nausea, vomiting, abdominal pain, diarrhea, constipation,  Genitourinary: Denies dysuria, urgency, frequency  Neurological: Denies dizziness,   Objective:  Physical Exam: Filed Vitals:   03/02/12 0912  BP: 111/71  Pulse: 146  Temp: 97 F (36.1 C)  TempSrc: Oral  Height: 6\' 1"  (1.854 m)  Weight: 319 lb 1.6 oz (144.743 kg)  SpO2: 97%   Constitutional: Vital signs reviewed.  Patient is a well-developed and well-nourished  in no acute distress and cooperative with exam. Alert and oriented x3.  Ear: TM normal bilaterally Mouth: no erythema or exudates, MMM Eyes: PERRL, EOMI, conjunctivae normal, No scleral icterus.  Neck: Supple,  Cardiovascular: RRR, S1 normal, S2 normal, no MRG, pulses symmetric and intact bilaterally Pulmonary/Chest: CTAB, no wheezes, rales, or rhonchi Abdominal: Soft. Non-tender, non-distended, bowel sounds are normal, Musculoskeletal: Significant swelling as well as tenderness  of right hand Hematology: no cervical adenopathy.  Neurological: A&O x3

## 2012-03-02 NOTE — Assessment & Plan Note (Signed)
Improving. Recommended if he is experiencing any worsening symptoms including fevers and chills along with her doctor, he needs to be reevaluated in the clinic.

## 2012-03-03 ENCOUNTER — Other Ambulatory Visit: Payer: Self-pay | Admitting: Internal Medicine

## 2012-03-24 ENCOUNTER — Ambulatory Visit: Payer: Medicaid Other | Admitting: Internal Medicine

## 2012-03-26 ENCOUNTER — Other Ambulatory Visit: Payer: Self-pay | Admitting: Internal Medicine

## 2012-03-31 ENCOUNTER — Other Ambulatory Visit: Payer: Self-pay | Admitting: Internal Medicine

## 2012-03-31 DIAGNOSIS — F411 Generalized anxiety disorder: Secondary | ICD-10-CM

## 2012-03-31 NOTE — Telephone Encounter (Signed)
It appears this patient is no longer using the pens, are these the appropriate needles?   Thanks

## 2012-04-05 ENCOUNTER — Telehealth: Payer: Self-pay | Admitting: Dietician

## 2012-04-05 MED ORDER — INSULIN GLARGINE 100 UNIT/ML ~~LOC~~ SOLN: 62.0000 [IU] | Freq: Every day | SUBCUTANEOUS | Status: DC

## 2012-04-05 NOTE — Telephone Encounter (Signed)
Call to find out if pt using insulin vials or pens.

## 2012-04-05 NOTE — Telephone Encounter (Signed)
Uses Lantus Solostar insulin pens and needs BD 31g x 5/16 (8mm)

## 2012-04-09 ENCOUNTER — Ambulatory Visit: Payer: Medicaid Other | Admitting: Internal Medicine

## 2012-04-09 ENCOUNTER — Other Ambulatory Visit: Payer: Self-pay | Admitting: Internal Medicine

## 2012-04-13 ENCOUNTER — Ambulatory Visit: Payer: Medicaid Other | Admitting: Pulmonary Disease

## 2012-04-14 ENCOUNTER — Other Ambulatory Visit: Payer: Self-pay | Admitting: Internal Medicine

## 2012-04-15 ENCOUNTER — Ambulatory Visit (INDEPENDENT_AMBULATORY_CARE_PROVIDER_SITE_OTHER): Payer: Medicaid Other | Admitting: Internal Medicine

## 2012-04-15 ENCOUNTER — Encounter: Payer: Self-pay | Admitting: Internal Medicine

## 2012-04-15 VITALS — BP 110/74 | HR 80 | Temp 97.1°F | Ht 73.0 in | Wt 323.4 lb

## 2012-04-15 DIAGNOSIS — F411 Generalized anxiety disorder: Secondary | ICD-10-CM

## 2012-04-15 DIAGNOSIS — E119 Type 2 diabetes mellitus without complications: Secondary | ICD-10-CM

## 2012-04-15 DIAGNOSIS — I129 Hypertensive chronic kidney disease with stage 1 through stage 4 chronic kidney disease, or unspecified chronic kidney disease: Secondary | ICD-10-CM

## 2012-04-15 DIAGNOSIS — E785 Hyperlipidemia, unspecified: Secondary | ICD-10-CM

## 2012-04-15 DIAGNOSIS — K219 Gastro-esophageal reflux disease without esophagitis: Secondary | ICD-10-CM

## 2012-04-15 DIAGNOSIS — I83812 Varicose veins of left lower extremities with pain: Secondary | ICD-10-CM

## 2012-04-15 DIAGNOSIS — M109 Gout, unspecified: Secondary | ICD-10-CM

## 2012-04-15 DIAGNOSIS — J302 Other seasonal allergic rhinitis: Secondary | ICD-10-CM

## 2012-04-15 DIAGNOSIS — F419 Anxiety disorder, unspecified: Secondary | ICD-10-CM

## 2012-04-15 DIAGNOSIS — I10 Essential (primary) hypertension: Secondary | ICD-10-CM

## 2012-04-15 DIAGNOSIS — N183 Chronic kidney disease, stage 3 unspecified: Secondary | ICD-10-CM

## 2012-04-15 DIAGNOSIS — I5022 Chronic systolic (congestive) heart failure: Secondary | ICD-10-CM

## 2012-04-15 DIAGNOSIS — F17200 Nicotine dependence, unspecified, uncomplicated: Secondary | ICD-10-CM

## 2012-04-15 DIAGNOSIS — N529 Male erectile dysfunction, unspecified: Secondary | ICD-10-CM

## 2012-04-15 LAB — LIPID PANEL
Cholesterol: 60 mg/dL (ref 0–200)
HDL: 26 mg/dL — ABNORMAL LOW (ref >39)
LDL Cholesterol: 24 mg/dL (ref 0–99)
Total CHOL/HDL Ratio: 2.3 Ratio
Triglycerides: 49 mg/dL (ref <150)
VLDL: 10 mg/dL (ref 0–40)

## 2012-04-15 LAB — BASIC METABOLIC PANEL WITH GFR
BUN: 95 mg/dL — ABNORMAL HIGH (ref 6–23)
Calcium: 8.9 mg/dL (ref 8.4–10.5)
Creat: 3.26 mg/dL — ABNORMAL HIGH (ref 0.50–1.35)
GFR, Est African American: 25 mL/min — ABNORMAL LOW
GFR, Est Non African American: 21 mL/min — ABNORMAL LOW
Glucose, Bld: 138 mg/dL — ABNORMAL HIGH (ref 70–99)
Potassium: 3.4 mEq/L — ABNORMAL LOW (ref 3.5–5.3)

## 2012-04-15 LAB — GLUCOSE, CAPILLARY: Glucose-Capillary: 140 mg/dL — ABNORMAL HIGH (ref 70–99)

## 2012-04-15 LAB — POCT GLYCOSYLATED HEMOGLOBIN (HGB A1C): Hemoglobin A1C: 8.6

## 2012-04-15 LAB — URIC ACID: Uric Acid, Serum: 15.2 mg/dL — ABNORMAL HIGH (ref 4.0–7.8)

## 2012-04-15 MED ORDER — TORSEMIDE 100 MG PO TABS: 100.0000 mg | ORAL_TABLET | Freq: Two times a day (BID) | ORAL | Status: DC

## 2012-04-15 MED ORDER — ALLOPURINOL 100 MG PO TABS: ORAL_TABLET | ORAL | Status: DC

## 2012-04-15 MED ORDER — SPIRONOLACTONE 25 MG PO TABS: 25.0000 mg | ORAL_TABLET | Freq: Every day | ORAL | Status: DC

## 2012-04-15 MED ORDER — METOLAZONE 10 MG PO TABS: 10.0000 mg | ORAL_TABLET | Freq: Every day | ORAL | Status: DC

## 2012-04-15 NOTE — Assessment & Plan Note (Signed)
Repeat echo in December shows ejection fraction of 25% with diffuse hypokinesis. This is decrement from prior echo which showed ejection fraction 35%. He is on diuretic therapy with spironolactone, torsemide, metolazone.He's not taking diuretic therapy as prescribed, missing several nighttime torsemide doses. He's also be taking metolazone every other day to every third day which he is not. He does not report worsening orthopnea or PND. Patient is below his baseline weight today with no JVD or crackles on lung exam. He is having worsening lower extremity edema.  I do think he is having an acute exacerbation of CHF but does appear to be volume overloaded. -Emphasize compliance with diuretic therapy including torsemide 100 mg twice daily and spironolactone daily. Instructed him to take metolazone 10 mg every other day to help with diuresis. -On beta blocker, does not take ACE inhibitor as a result of acute renal failure with prior ACE. -Keep followup appointment with cardiologist Dr. Tenny Craw next month -Discussed warning symptoms, went to call the clinic, 911 are present the ED for symptoms of worsening heart failure. - Check Bmet today

## 2012-04-15 NOTE — Assessment & Plan Note (Addendum)
Lab Results  Component Value Date   HGBA1C 8.6 04/15/2012   HGBA1C 7.0 02/10/2012   HGBA1C 7.2 11/12/2011    Patient with worsening control of BG. Has been on short steroid therapy, but also missing 2-3 nights lantus per week. Instructed him on importance of NIGHTLY adherence to lantus therapy. Ideally he would be on combination basal/bolus insulin but not willing to do mealtime coverage and I doubt he could comply with multidose regimen.   Assessment:  Diabetes control: fair control  Progress toward A1C goal:  deteriorated  Comments: not taking lantus every night  Plan:  Medications:  continue current medications lantus 62 U qhs  Home glucose monitoring:   Frequency: once a day    Instruction/counseling given: reminded to bring blood glucose meter & log to each visit

## 2012-04-15 NOTE — Assessment & Plan Note (Signed)
Contracted for Ativan 0.5 mg twice a day when necessary #60 per month. He will need to be weaned off of this, but is having some continued acute anxiety in the setting of multiple uncontrolled medical problems. Advised him that he would likely benefit from SSRI therapy. Will continue Ativan for now with goal of weaning.

## 2012-04-15 NOTE — Patient Instructions (Addendum)
1. PLEASE TAKE ALL OF YOUR DIURETICS: Torsemide 100mg  twice a day,  Spironolactone 25 mg daily, Metalozone 10mg  every other day 2. Increase your allopurinol to 1 tablet daily 3. Please follow up with your specialists. I will call you next week.  4. Please take your insulin every night!

## 2012-04-15 NOTE — Assessment & Plan Note (Addendum)
Lab Results  Component Value Date   CREATININE 3.12* 03/02/2012   Patient gradually worsening creatinine, now in stage 3/4 chronic kidney disease based on calculated GFR. Follows at [facility], saw Dr. Lowell Guitar in November. Says he has an appointment in a few days with a nephrologist. Have asked him to ask [facility] to fax [place] the records of that visit. Repeating a bmet today On ACE inhibitor therapy due to acute renal failure with prior trial  Lab Results  Component Value Date   CREATININE 3.26* 04/15/2012   Gradual worsening renal function. Called and spoke with patient. He has appointment with nephrologist 04/19/12. Urged him to keep appointment. He agreed.

## 2012-04-15 NOTE — Assessment & Plan Note (Addendum)
No acute gout flare since last clinic visit in early February. Completed a prednisone taper in February. Taking allopurinol 50 mg daily. Will titrate up to 100 mg daily. Will not increase higher based on chronic any disease. We'll check uric acid today. -Bmet, uric acid today -Increased allopurinol to 100 mg daily

## 2012-04-15 NOTE — Assessment & Plan Note (Signed)
  Assessment:  Progress toward smoking cessation:   Still smokes .5 ppd  Barriers to progress toward smoking cessation:   motivation  Comments: pprecontemplative  Plan:  Instruction/counseling given:  Counseled cessation. He will try to cut down daily cigarette intake by 3-5 cigarettes over the next 3 months before next appointment.

## 2012-04-15 NOTE — Progress Notes (Signed)
Patient ID: Adam [name], male   DOB: [birth year], 47 y.o.   MRN: [id number]  Subjective:   Patient ID: Adam [name] male   DOB: 25-Jul-1965 47 y.o.   MRN: [id number]  HPI: Adam [name] is a 47 y.o. male with past medical history of CHF with ejection fraction 25%, obstructive sleep apnea, hypertension, gout, and type 2 diabetes presenting for routine follow-up. We've recently been treating Adam [name] for his gout. He reports interval improvement in joint pain, but still complains of some stiffness. He asked about drinking beer and I told him that this will worsen his gout disease. No acute gouty attacks since starting allopurinol. He's been taking 50 mg daily. He does report interval worsening of lower extremity and overall says that he feels more tired than usual. It appears that he's not taking diuretic therapy as prescribed. He is supposed to take torsemide 100 mg twice a day, spironolactone 25 mg a day, as well as metolazone every other day. He is missing occasional nighttime doses of torsemide therapy reports he's not taking metolazone. He says that he has an appointment with his cardiologist in a month's time to discuss her further management. Echocardiogram was ordered by cardiologist in December reveals decrement in ejection fraction from 35% to 25% and diffuse hypokinesis. Patient's blood sugar control also been deteriorating. He has completed a course of oral prednisone therapy last month, but also reports missing his Lantus dose is a couple times per week. He is supposed to be taking 62 units at night. Denies any hypoglycemic events. Has ophtho appt in 3 months.  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 He follows with Dr. Lowell Guitar and Dr. Allena Katz at [facility]. He says that Dr. Allena Katz has told him in the past to take metolazone every 3 days for "booster." He says he has a f/u appt w nephrologist in a few days.  He continues to smoke a  half a pack a day and is not ready for cessation but says he will try to cut back by a few cigarettes daily over the next 3 months.  Past Medical History  Diagnosis Date  . Other and unspecified hyperlipidemia   . Insomnia, unspecified   . Obstructive sleep apnea (adult) (pediatric)   . Allergic rhinitis, cause unspecified   . Nonischemic cardiomyopathy   . Unspecified essential hypertension   . Type II or unspecified type diabetes mellitus without mention of complication, not stated as uncontrolled   . CKD (chronic kidney disease) stage 3, GFR 30-59 ml/min   . Esophageal reflux   . Morbid obesity   . Dysuria   . Tobacco use disorder   . Dysrhythmia   . Pacemaker   . Antral ulcer    Current Outpatient Prescriptions  Medication Sig Dispense Refill  . ACCU-CHEK COMPACT STRIPS test strip USE TO TEST BLOOD SUGAR UP TO 1 TIME A DAY  102 each  4  . ACCU-CHEK SOFTCLIX LANCETS lancets USE TO CHECK BLOOD SUGAR UP TO 1 TIME A DAY  100 each  3  . acetaminophen (TYLENOL) 325 MG tablet Take 650 mg by mouth every 6 (six) hours as needed. For pain      . allopurinol (ZYLOPRIM) 100 MG tablet Take one-half of a tablet daily to prevent gout attacks.  15 tablet  1  . B-D ULTRAFINE III SHORT PEN 31G X 8 MM MISC USE TO INJECT INSULIN AS DIRECTED  100 each  3  .  colchicine 0.6 MG tablet Take 2 tablets immediately. WAIT ONE HOUR and take one more tablet  3 tablet  0  . docusate sodium (COLACE) 100 MG capsule Take 100 mg by mouth daily.       Marland Kitchen esomeprazole (NEXIUM) 20 MG capsule Take 1 capsule (20 mg total) by mouth daily before breakfast.  30 capsule  11  . ferrous sulfate 325 (65 FE) MG tablet Take 1 tablet (325 mg total) by mouth 2 (two) times daily with breakfast and lunch.  60 tablet  2  . fluticasone (FLONASE) 50 MCG/ACT nasal spray Place 1 spray into the nose daily as needed. For allergies      . insulin glargine (LANTUS SOLOSTAR) 100 UNIT/ML injection Inject 62 Units into the skin at bedtime.  30 mL   11  . loratadine (CLARITIN) 10 MG tablet Take 1 tablet (10 mg total) by mouth daily.  30 tablet  3  . LORazepam (ATIVAN) 0.5 MG tablet Take 1 tablet (0.5 mg total) by mouth every 12 (twelve) hours as needed for anxiety.  60 tablet  1  . metolazone (ZAROXOLYN) 10 MG tablet Take 10 mg by mouth daily.       . metoprolol (LOPRESSOR) 100 MG tablet TAKE 1 TABLET BY MOUTH TWICE A DAY  62 tablet  10  . predniSONE (STERAPRED UNI-PAK) 10 MG tablet Take 1 tablet (10 mg total) by mouth daily. Take 40 mg today, take 30 mg tomorrow ( on 2/5), Take 20 mg  on 2/6 , Take 10 mg  2/7 and  2/8.  11 tablet  0  . simvastatin (ZOCOR) 40 MG tablet TAKE 1 TABLET BY MOUTH DAILY  30 tablet  11  . spironolactone (ALDACTONE) 25 MG tablet Take 1 tablet (25 mg total) by mouth daily.  31 tablet  11  . torsemide (DEMADEX) 100 MG tablet Take 100 mg by mouth 2 (two) times daily.        . traMADol (ULTRAM) 50 MG tablet Take 2 tablets (100 mg total) by mouth every 6 (six) hours as needed for pain. Do not exceed 8 pills a day (400mg ).  120 tablet  1  . traMADol (ULTRAM) 50 MG tablet TAKE 1 TABLET BY MOUTH EVERY 6 HOURS AS NEEDED FOR PAIN  60 tablet  0  . VOLTAREN 1 % GEL APPLY 1 APPLICATION TOPICALLY 4 TIMES A DAY  100 g  1   No current facility-administered medications for this visit.   Family History  Problem Relation Age of Onset  . Diabetes Mother   . Microcephaly Father   . Lung cancer Father    History   Social History  . Marital Status: Single    Spouse Name: N/A    Number of Children: N/A  . Years of Education: N/A   Social History Main Topics  . Smoking status: Current Every Day Smoker -- 0.30 packs/day    Types: Cigarettes  . Smokeless tobacco: Never Used     Comment: started smoking at age 13. Will stop on his own - cutting back now- was smoking a ppd - now only 3 a day  . Alcohol Use: No     Comment: No alcohol x 1 month.  . Drug Use: No  . Sexually Active: None   Other Topics Concern  . None    Social History Narrative  . None   Review of Systems: 10 pt ROS performed, pertinent positives and negatives noted in HPI Objective:  Physical Exam: Ceasar Mons  Vitals:   04/15/12 0938  BP: 110/74  Temp: 97.1 F (36.2 C)  TempSrc: Oral  Height: 6\' 1"  (1.854 m)  Weight: 323 lb 6.4 oz (146.693 kg)  SpO2: 98%   Constitutional: Vital signs reviewed.  Patient is an obese male in no acute distress and cooperative with exam. Alert and oriented x3.  Head: Normocephalic and atraumatic Mouth: no erythema or exudates, MMM Eyes: PERRL, EOMI, conjunctivae normal, No scleral icterus.  Neck: Supple, no masses or JVD Cardiovascular: RRR, S1 normal, S2 normal, no MRG, pulses symmetric and intact bilaterally Pulmonary/Chest: Soft upper airway rhonchi R upper lung field Abdominal: Soft. Non-tender, non-distended, bowel sounds are normal, no masses, organomegaly, or guarding present.  Musculoskeletal: No synovitis, effusions or tophi Hematology: no cervical adenopathy or visible bruising Neurological: A&O x3, Strength grossly intact Extremities: 2-3+ pitting edema from feet to knees Skin: Warm, dry and intact. No rash, cyanosis, or clubbing.  Psychiatric: Normal mood and affect. Assessment & Plan:   Please see problem-based charting for assessment and plan.

## 2012-04-15 NOTE — Assessment & Plan Note (Signed)
Lab Results  Component Value Date   CHOL 100 02/20/2011   HDL 29* 02/20/2011   LDLCALC 56 02/20/2011   TRIG 77 02/20/2011   CHOLHDL 3.4 02/20/2011   Previously with good control of LDL and simvastatin 40 mg. He continues to take simvastatin. We'll repeat a lipid panel today.

## 2012-04-15 NOTE — Assessment & Plan Note (Signed)
BP Readings from Last 3 Encounters:  04/15/12 110/74  03/02/12 111/71  02/10/12 120/83    Lab Results  Component Value Date   NA 134* 03/02/2012   K 3.6 03/02/2012   CREATININE 3.12* 03/02/2012    Assessment:  Blood pressure control:  at goal  Progress toward BP goal:   at goal    Plan:  Medications:  continue current medications

## 2012-04-15 NOTE — Assessment & Plan Note (Signed)
Counseled on nutrition, exercise, need for weight loss. Patient understands but finds it difficult to exercise well his chronic medical problems. Does have the well but has trouble making good choices. We'll continue to support him in weight loss efforts.

## 2012-04-16 ENCOUNTER — Ambulatory Visit: Payer: Medicaid Other | Admitting: Pulmonary Disease

## 2012-04-26 ENCOUNTER — Other Ambulatory Visit: Payer: Self-pay | Admitting: Internal Medicine

## 2012-04-26 DIAGNOSIS — M109 Gout, unspecified: Secondary | ICD-10-CM

## 2012-04-26 MED ORDER — COLCHICINE 0.6 MG PO TABS: 0.6000 mg | ORAL_TABLET | Freq: Every day | ORAL | Status: DC

## 2012-04-26 MED ORDER — ALLOPURINOL 100 MG PO TABS: 100.0000 mg | ORAL_TABLET | Freq: Every day | ORAL | Status: DC

## 2012-04-26 NOTE — Progress Notes (Signed)
Received communication from nephrologist Dr. Lowell Guitar regarding control of gout in setting of worsening CKD. Recommends continued use and possible uptitration of allopurinol as well as addition of colchicine 0.6mg .  Called to discuss with patient, he agrees to try addition of colchicine. Discussed potential side effects.  He is to f/u w nephrology in April.  Bronson Curb, MD  PGY-1, Internal Medicine Resident Pager: [phone] (7AM-5PM) 04/26/2012, 3:41 PM

## 2012-05-10 ENCOUNTER — Other Ambulatory Visit: Payer: Self-pay | Admitting: Internal Medicine

## 2012-05-10 NOTE — Telephone Encounter (Signed)
Rx called in to pharmacy. 

## 2012-05-20 ENCOUNTER — Encounter: Payer: Self-pay | Admitting: Internal Medicine

## 2012-05-20 ENCOUNTER — Ambulatory Visit (INDEPENDENT_AMBULATORY_CARE_PROVIDER_SITE_OTHER): Payer: Medicaid Other | Admitting: *Deleted

## 2012-05-20 ENCOUNTER — Other Ambulatory Visit: Payer: Self-pay | Admitting: Internal Medicine

## 2012-05-20 ENCOUNTER — Ambulatory Visit (INDEPENDENT_AMBULATORY_CARE_PROVIDER_SITE_OTHER): Payer: Medicaid Other | Admitting: Internal Medicine

## 2012-05-20 VITALS — BP 104/78 | HR 71 | Ht 73.0 in | Wt 325.4 lb

## 2012-05-20 DIAGNOSIS — I2589 Other forms of chronic ischemic heart disease: Secondary | ICD-10-CM

## 2012-05-20 DIAGNOSIS — I428 Other cardiomyopathies: Secondary | ICD-10-CM

## 2012-05-20 DIAGNOSIS — I255 Ischemic cardiomyopathy: Secondary | ICD-10-CM

## 2012-05-20 LAB — ICD DEVICE OBSERVATION
BRDY-0002RV: 40 {beats}/min
CHARGE TIME: 9.3 s
DEV-0020ICD: NEGATIVE
DEV-0020ICD: NEGATIVE
HV IMPEDENCE: 41 Ohm
HV IMPEDENCE: 41 Ohm
PACEART VT: 0
RV LEAD AMPLITUDE: 12 mv
TOT-0007: 1
TOT-0008: 0
TOT-0009: 0
TOT-0009: 0
TOT-0010: 13
TOT-0010: 13
VENTRICULAR PACING ICD: 0 pct
VF: 0

## 2012-05-20 NOTE — Progress Notes (Signed)
HPI Patient is a 47 yo with a history of NICM (LVEF 30%), HTN, CRI Since seen he continues to f/u in renal clinic.  He was seen earlier today.  His renal function is marginal and Dr Lowell Guitar recommended he make an appt with [facility] The patinet says it has been harder to keep fluid off even though taking a water pill He denies CP  Does get SOB with activity but denies PND  Notes LE edema    No Known Allergies  Current Outpatient Prescriptions  Medication Sig Dispense Refill  . ACCU-CHEK COMPACT STRIPS test strip USE TO TEST BLOOD SUGAR UP TO 1 TIME A DAY  102 each  4  . ACCU-CHEK SOFTCLIX LANCETS lancets USE TO CHECK BLOOD SUGAR UP TO 1 TIME A DAY  100 each  3  . acetaminophen (TYLENOL) 325 MG tablet Take 650 mg by mouth every 6 (six) hours as needed. For pain      . allopurinol (ZYLOPRIM) 100 MG tablet Take 1 tablet (100 mg total) by mouth daily.  30 tablet  5  . B-D ULTRAFINE III SHORT PEN 31G X 8 MM MISC USE TO INJECT INSULIN AS DIRECTED  100 each  3  . colchicine 0.6 MG tablet Take 1 tablet (0.6 mg total) by mouth daily.  30 tablet  2  . docusate sodium (COLACE) 100 MG capsule Take 100 mg by mouth daily.       Marland Kitchen esomeprazole (NEXIUM) 20 MG capsule Take 1 capsule (20 mg total) by mouth daily before breakfast.  30 capsule  11  . ferrous sulfate 325 (65 FE) MG tablet Take 1 tablet (325 mg total) by mouth 2 (two) times daily with breakfast and lunch.  60 tablet  2  . fluticasone (FLONASE) 50 MCG/ACT nasal spray Place 1 spray into the nose daily as needed. For allergies      . insulin glargine (LANTUS SOLOSTAR) 100 UNIT/ML injection Inject 62 Units into the skin at bedtime.  30 mL  11  . loratadine (CLARITIN) 10 MG tablet Take 1 tablet (10 mg total) by mouth daily.  30 tablet  3  . LORazepam (ATIVAN) 0.5 MG tablet TAKE 1 TABLET BY MOUTH TWICE A DAY AS NEEDED  60 tablet  1  . metolazone (ZAROXOLYN) 10 MG tablet Take 1 tablet (10 mg total) by mouth daily.  30 tablet  5  . metoprolol (LOPRESSOR) 100  MG tablet TAKE 1 TABLET BY MOUTH TWICE A DAY  62 tablet  10  . predniSONE (STERAPRED UNI-PAK) 10 MG tablet Take 1 tablet (10 mg total) by mouth daily. Take 40 mg today, take 30 mg tomorrow ( on 2/5), Take 20 mg  on 2/6 , Take 10 mg  2/7 and  2/8.  11 tablet  0  . simvastatin (ZOCOR) 40 MG tablet TAKE 1 TABLET BY MOUTH DAILY  30 tablet  11  . spironolactone (ALDACTONE) 25 MG tablet Take 1 tablet (25 mg total) by mouth daily.  31 tablet  11  . torsemide (DEMADEX) 100 MG tablet Take 1 tablet (100 mg total) by mouth 2 (two) times daily.  60 tablet  5  . traMADol (ULTRAM) 50 MG tablet Take 2 tablets (100 mg total) by mouth every 6 (six) hours as needed for pain. Do not exceed 8 pills a day (400mg ).  120 tablet  1  . VOLTAREN 1 % GEL APPLY 1 APPLICATION TOPICALLY 4 TIMES A DAY  100 g  1   No current facility-administered  medications for this visit.    Past Medical History  Diagnosis Date  . Other and unspecified hyperlipidemia   . Insomnia, unspecified   . Obstructive sleep apnea (adult) (pediatric)   . Allergic rhinitis, cause unspecified   . Nonischemic cardiomyopathy   . Unspecified essential hypertension   . Type II or unspecified type diabetes mellitus without mention of complication, not stated as uncontrolled   . CKD (chronic kidney disease) stage 3, GFR 30-59 ml/min   . Esophageal reflux   . Morbid obesity   . Dysuria   . Tobacco use disorder   . Dysrhythmia   . Pacemaker   . Antral ulcer     Past Surgical History  Procedure Laterality Date  . Cardiac defibrillator placement  2011  . Esophagogastroduodenoscopy  01/03/2011    Procedure: ESOPHAGOGASTRODUODENOSCOPY (EGD);  Surgeon: Vertell Novak., MD;  Location: [facility] ENDOSCOPY;  Service: Endoscopy;  Laterality: N/A;    Family History  Problem Relation Age of Onset  . Diabetes Mother   . Microcephaly Father   . Lung cancer Father     History   Social History  . Marital Status: Single    Spouse Name: N/A    Number of  Children: N/A  . Years of Education: N/A   Occupational History  . Not on file.   Social History Main Topics  . Smoking status: Current Every Day Smoker -- 0.30 packs/day    Types: Cigarettes  . Smokeless tobacco: Never Used     Comment: started smoking at age 28. Will stop on his own - cutting back now- was smoking a ppd - now only 3 a day  . Alcohol Use: No     Comment: No alcohol x 1 month.  . Drug Use: No  . Sexually Active: Not on file   Other Topics Concern  . Not on file   Social History Narrative  . No narrative on file    Review of Systems:  All systems reviewed.  They are negative to the above problem except as previously stated.  Vital Signs: BP 104/78  Pulse 71  Ht 6\' 1"  (1.854 m)  Wt 325 lb 6.4 oz (147.6 kg)  BMI 42.94 kg/m2  Physical Exam Patient is in NAD HEENT:  Normocephalic, atraumatic. EOMI, PERRLA.  Neck: JVP is increased  No bruits.  Lungs: clear to auscultation. No rales no wheezes.  Heart: Regular rate and rhythm. Normal S1, S2. No S3.   No significant murmurs. PMI not displaced.  Abdomen:  Supple, nontender. Normal bowel sounds. No masses. No hepatomegaly.  Extremities:   Good distal pulses throughout. 1+ lower extremity edema.  Musculoskeletal :moving all extremities.  Neuro:   alert and oriented x3.  CN II-XII grossly intact.  EKG  SR 71 bpm  Low voltage.   First degee AV block.   Assessment and Plan:  1.  Cardiomyopathy.  Volume status is up some.  I am not going to change meds.  Will get labs from renal clinic ICD was interrogated today.  ICD batter has gone down.  Set f/u for f/u in EP clinic.  2.  CRI  Will get lab report.  Patient is very anxious about having to start dialysis.  Worried about dying.  Mother was on dialysis.  3

## 2012-05-20 NOTE — Patient Instructions (Addendum)
Your physician recommends that you schedule a follow-up appointment in: 3 months with Dr Taylor  

## 2012-05-20 NOTE — Progress Notes (Signed)
icd check in clinic. Normal device function. Pt has not been seen since 2010. ROV in 3 mths with [facility].

## 2012-05-25 ENCOUNTER — Other Ambulatory Visit: Payer: Self-pay

## 2012-05-25 ENCOUNTER — Encounter: Payer: Self-pay | Admitting: Vascular Surgery

## 2012-05-25 DIAGNOSIS — N179 Acute kidney failure, unspecified: Secondary | ICD-10-CM

## 2012-05-25 DIAGNOSIS — Z01810 Encounter for preprocedural cardiovascular examination: Secondary | ICD-10-CM

## 2012-06-07 ENCOUNTER — Other Ambulatory Visit: Payer: Self-pay | Admitting: Internal Medicine

## 2012-06-08 ENCOUNTER — Encounter: Payer: Self-pay | Admitting: Vascular Surgery

## 2012-06-09 ENCOUNTER — Encounter (INDEPENDENT_AMBULATORY_CARE_PROVIDER_SITE_OTHER): Payer: Medicaid Other | Admitting: *Deleted

## 2012-06-09 ENCOUNTER — Ambulatory Visit (INDEPENDENT_AMBULATORY_CARE_PROVIDER_SITE_OTHER): Payer: Medicaid Other | Admitting: Vascular Surgery

## 2012-06-09 ENCOUNTER — Encounter: Payer: Self-pay | Admitting: Vascular Surgery

## 2012-06-09 VITALS — BP 102/72 | HR 98 | Resp 18 | Ht 73.0 in | Wt 310.0 lb

## 2012-06-09 DIAGNOSIS — Z01810 Encounter for preprocedural cardiovascular examination: Secondary | ICD-10-CM

## 2012-06-09 DIAGNOSIS — N179 Acute kidney failure, unspecified: Secondary | ICD-10-CM

## 2012-06-09 DIAGNOSIS — N186 End stage renal disease: Secondary | ICD-10-CM

## 2012-06-09 DIAGNOSIS — N183 Chronic kidney disease, stage 3 unspecified: Secondary | ICD-10-CM

## 2012-06-09 NOTE — Assessment & Plan Note (Signed)
Aced on the patient's vein map he appears to be a reasonable candidate for a right upper extremity AV fistula. He has an AICD in the left arm and is not a candidate for a fistula on the left. I have explained the indications for placement of an AV fistula or AV graft. I've explained that if at all possible we will place an AV fistula.  I have reviewed the risks of placement of an AV fistula including but not limited to: failure of the fistula to mature, need for subsequent interventions, and thrombosis. In addition I have reviewed the potential complications of placement of an AV graft. These risks include, but are not limited to, graft thrombosis, graft infection, wound healing problems, bleeding, arm swelling, and steal syndrome. All the patient's questions were answered. Currently, he is not ready to schedule surgery and would like to discuss this further with Dr. Lowell Guitar. Once he is agreeable to proceed we can schedule him for a right AV fistula.

## 2012-06-09 NOTE — Progress Notes (Signed)
[facility]  Patient name: Adam [name] MRN: [id number] DOB: 03/06/1965 Sex: male  REASON FOR CONSULT: Evaluate for hemodialysis access. Referred by Dr. Lowell Guitar  HPI: Adam [name] is a 46 y.o. male who is not yet on dialysis. He is right-handed. He was referred for evaluation for hemodialysis access. Of note he has an AICD on the left side. He denies any recent uremic symptoms. Specifically he denies nausea, vomiting, fatigue, anorexia, or palpitations.  I have reviewed his records from Dr. Roanna Banning office. He has stage IV chronic kidney disease with significant as the tenia. In addition he has cardiomyopathy.  Past Medical History  Diagnosis Date  . Other and unspecified hyperlipidemia   . Insomnia, unspecified   . Obstructive sleep apnea (adult) (pediatric)   . Allergic rhinitis, cause unspecified   . Nonischemic cardiomyopathy   . Unspecified essential hypertension   . Type II or unspecified type diabetes mellitus without mention of complication, not stated as uncontrolled   . CKD (chronic kidney disease) stage 3, GFR 30-59 ml/min   . Esophageal reflux   . Morbid obesity   . Dysuria   . Tobacco use disorder   . Dysrhythmia   . Pacemaker   . Antral ulcer   . Renal azotemia   . Arthritis     Gout w/hyperuricemia  . Morbid obesity    Family History  Problem Relation Age of Onset  . Diabetes Mother   . Microcephaly Father   . Lung cancer Father    SOCIAL HISTORY: History  Substance Use Topics  . Smoking status: Current Every Day Smoker -- 0.30 packs/day    Types: Cigarettes  . Smokeless tobacco: Never Used     Comment: started smoking at age 32. Will stop on his own - cutting back now- was smoking a ppd - now only 3 a day  . Alcohol Use: No     Comment: No alcohol x 1 month.   No Known Allergies  Current Outpatient Prescriptions  Medication Sig Dispense Refill  . ACCU-CHEK COMPACT STRIPS test strip USE TO TEST BLOOD SUGAR UP TO 1 TIME A DAY   102 each  4  . ACCU-CHEK SOFTCLIX LANCETS lancets USE TO CHECK BLOOD SUGAR UP TO 1 TIME A DAY  100 each  3  . acetaminophen (TYLENOL) 325 MG tablet Take 650 mg by mouth every 6 (six) hours as needed. For pain      . B-D ULTRAFINE III SHORT PEN 31G X 8 MM MISC USE TO INJECT INSULIN AS DIRECTED  100 each  3  . colchicine 0.6 MG tablet Take 1 tablet (0.6 mg total) by mouth daily.  30 tablet  2  . docusate sodium (COLACE) 100 MG capsule Take 100 mg by mouth daily.       Marland Kitchen esomeprazole (NEXIUM) 20 MG capsule Take 1 capsule (20 mg total) by mouth daily before breakfast.  30 capsule  11  . ferrous sulfate 325 (65 FE) MG tablet Take 1 tablet (325 mg total) by mouth 2 (two) times daily with breakfast and lunch.  60 tablet  2  . fluticasone (FLONASE) 50 MCG/ACT nasal spray Place 1 spray into the nose daily as needed. For allergies      . insulin glargine (LANTUS SOLOSTAR) 100 UNIT/ML injection Inject 62 Units into the skin at bedtime.  30 mL  11  . loratadine (CLARITIN) 10 MG tablet TAKE 1 TABLET (10 MG TOTAL) BY MOUTH DAILY.  30 tablet  3  .  LORazepam (ATIVAN) 0.5 MG tablet TAKE 1 TABLET BY MOUTH TWICE A DAY AS NEEDED  60 tablet  1  . metolazone (ZAROXOLYN) 10 MG tablet Take 1 tablet (10 mg total) by mouth daily.  30 tablet  5  . metoprolol (LOPRESSOR) 100 MG tablet TAKE 1 TABLET BY MOUTH TWICE A DAY  62 tablet  10  . simvastatin (ZOCOR) 40 MG tablet TAKE 1 TABLET BY MOUTH DAILY  30 tablet  11  . spironolactone (ALDACTONE) 25 MG tablet Take 1 tablet (25 mg total) by mouth daily.  31 tablet  11  . torsemide (DEMADEX) 100 MG tablet Take 1 tablet (100 mg total) by mouth 2 (two) times daily.  60 tablet  5  . traMADol (ULTRAM) 50 MG tablet TAKE 1 TABLET BY MOUTH EVERY 6 HOURS AS NEEDED FOR PAIN  60 tablet  0  . VOLTAREN 1 % GEL APPLY 1 APPLICATION TOPICALLY 4 TIMES A DAY  100 g  1  . allopurinol (ZYLOPRIM) 100 MG tablet Take 1 tablet (100 mg total) by mouth daily.  30 tablet  5  . predniSONE (STERAPRED  UNI-PAK) 10 MG tablet Take 1 tablet (10 mg total) by mouth daily. Take 40 mg today, take 30 mg tomorrow ( on 2/5), Take 20 mg  on 2/6 , Take 10 mg  2/7 and  2/8.  11 tablet  0  . traMADol (ULTRAM) 50 MG tablet Take 2 tablets (100 mg total) by mouth every 6 (six) hours as needed for pain. Do not exceed 8 pills a day (400mg ).  120 tablet  1   No current facility-administered medications for this visit.   REVIEW OF SYSTEMS: Arly.Keller ] denotes positive finding; [  ] denotes negative finding  CARDIOVASCULAR:  [ ]  chest pain   [ ]  chest pressure   [ ]  palpitations   [ ]  orthopnea   [ ]  dyspnea on exertion   [ ]  claudication   [ ]  rest pain   [ ]  DVT   [ ]  phlebitis PULMONARY:   [ ]  productive cough   [ ]  asthma   [ ]  wheezing NEUROLOGIC:   [ ]  weakness  [ ]  paresthesias  [ ]  aphasia  [ ]  amaurosis  [ ]  dizziness HEMATOLOGIC:   [ ]  bleeding problems   [ ]  clotting disorders MUSCULOSKELETAL:  [ ]  joint pain   [ ]  joint swelling [ ]  leg swelling GASTROINTESTINAL: [ ]   blood in stool  [ ]   hematemesis GENITOURINARY:  [ ]   dysuria  [ ]   hematuria PSYCHIATRIC:  [ ]  history of major depression INTEGUMENTARY:  [ ]  rashes  [ ]  ulcers CONSTITUTIONAL:  [ ]  fever   [ ]  chills  PHYSICAL EXAM: Filed Vitals:   06/09/12 1017  BP: 102/72  Pulse: 98  Resp: 18  Height: 6\' 1"  (1.854 m)  Weight: 310 lb (140.615 kg)  SpO2: 98%   Body mass index is 40.91 kg/(m^2). GENERAL: The patient is a well-nourished male, in no acute distress. The vital signs are documented above. CARDIOVASCULAR: There is a regular rate and rhythm. I do not detect carotid bruits. He has palpable radial pulses bilaterally. PULMONARY: There is good air exchange bilaterally without wheezing or rales. ABDOMEN: Soft and non-tender with normal pitched bowel sounds.  MUSCULOSKELETAL: There are no major deformities or cyanosis. NEUROLOGIC: No focal weakness or paresthesias are detected. SKIN: There are no ulcers or rashes noted. PSYCHIATRIC: The  patient has a normal affect.  DATA:  I've independently interpreted his vein mapping which shows he has a reasonable forearm, upper arm cephalic vein on the right. He also has a reasonable basilic vein on the right.  MEDICAL ISSUES:  End stage renal disease Aced on the patient's vein map he appears to be a reasonable candidate for a right upper extremity AV fistula. He has an AICD in the left arm and is not a candidate for a fistula on the left. I have explained the indications for placement of an AV fistula or AV graft. I've explained that if at all possible we will place an AV fistula.  I have reviewed the risks of placement of an AV fistula including but not limited to: failure of the fistula to mature, need for subsequent interventions, and thrombosis. In addition I have reviewed the potential complications of placement of an AV graft. These risks include, but are not limited to, graft thrombosis, graft infection, wound healing problems, bleeding, arm swelling, and steal syndrome. All the patient's questions were answered. Currently, he is not ready to schedule surgery and would like to discuss this further with Dr. Lowell Guitar. Once he is agreeable to proceed we can schedule him for a right AV fistula.    DICKSON,CHRISTOPHER S [facility] Beeper: [phone]

## 2012-06-10 ENCOUNTER — Encounter: Payer: Self-pay | Admitting: Nephrology

## 2012-06-24 ENCOUNTER — Encounter: Payer: Self-pay | Admitting: Internal Medicine

## 2012-06-24 ENCOUNTER — Ambulatory Visit (INDEPENDENT_AMBULATORY_CARE_PROVIDER_SITE_OTHER): Payer: Medicaid Other | Admitting: Internal Medicine

## 2012-06-24 VITALS — BP 109/74 | HR 91 | Temp 97.4°F | Wt 316.9 lb

## 2012-06-24 DIAGNOSIS — I5022 Chronic systolic (congestive) heart failure: Secondary | ICD-10-CM

## 2012-06-24 DIAGNOSIS — E119 Type 2 diabetes mellitus without complications: Secondary | ICD-10-CM

## 2012-06-24 DIAGNOSIS — I10 Essential (primary) hypertension: Secondary | ICD-10-CM

## 2012-06-24 DIAGNOSIS — N184 Chronic kidney disease, stage 4 (severe): Secondary | ICD-10-CM

## 2012-06-24 DIAGNOSIS — F17200 Nicotine dependence, unspecified, uncomplicated: Secondary | ICD-10-CM

## 2012-06-24 DIAGNOSIS — M109 Gout, unspecified: Secondary | ICD-10-CM

## 2012-06-24 NOTE — Assessment & Plan Note (Signed)
BP Readings from Last 3 Encounters:  06/24/12 109/74  06/09/12 102/72  05/20/12 104/78    Lab Results  Component Value Date   NA 138 04/15/2012   K 3.4* 04/15/2012   CREATININE 3.26* 04/15/2012    Assessment: Blood pressure control: controlled Progress toward BP goal:  at goal   Plan: Medications:  continue current medications Educational resources provided: brochure;video Self management tools provided: home blood pressure logbook

## 2012-06-24 NOTE — Assessment & Plan Note (Signed)
  Assessment: Progress toward smoking cessation:  smoking the same amount Barriers to progress toward smoking cessation:  lack of motivation to quit   Plan: Instruction/counseling given:  I counseled patient on the dangers of tobacco use, advised patient to stop smoking and reviewed strategies to maximize success. Educational resources provided:  [employer]) brochure Self management tools provided:  smoking cessation plan (STAR Quit Plan) Medications to assist with smoking cessation:  None Patient agreed to the following self-care plans for smoking cessation: go to the [employer] ([email or web address])

## 2012-06-24 NOTE — Assessment & Plan Note (Signed)
Weight down a little from last visit, pt says swelling down. Saw cardiology last month, ICD interrogated that that time. I do not see evidence of acute decompensated HF at this time. Lungs are clear.  - Recommended continue compliance with metolazone, torsemide, and spironolactone as he occasionally misses doses.  - He is on BB, not on ACE-i due to allergy.

## 2012-06-24 NOTE — Progress Notes (Signed)
Patient ID: Adam [name], male   DOB: 22-Jun-1965, 47 y.o.   MRN: [id number]  Subjective:   Patient ID: Adam [name] male   DOB: [birth year] 47 y.o.   MRN: [id number]  HPI: [name] is a 47 y.o. male with past medical history of stage IV CKD, CHF with ejection fraction 25%, obstructive sleep apnea, hypertension, gout, and type 2 diabetes presenting for acute visit for hearing loss and nausea. Patient reports that for over the past couple weeks, he has felt like his right ear is "stopped up, " and has had decreased hearing in this ear. He denies any ear pain or drainage. He's had no fever. He denies any dizziness, numbness, headache, or facial weakness. He has had occasional stuffy nose and runny eyes with his seasonal allergies. He has used over-the-counter cerumen softening agents which have helped him feel less clogged up. He's also noticed a decreased appetite lately. He says he occasionally feels nauseated. He denies any diarrhea or stomach pain. He's not had any emesis.  He is growing increasingly uremic with his progressive stage IV chronic kidney disease with most recent GFR of 17 and BUN 122. He follows [facility], who referred him to vascular surgery for evaluation of access for eventual dialysis. Patient still anxious about the prospect of dialysis. He reports compliance with diuretic therapy for his nonischemic cardiomyopathy. He says that his lower extremity edema is somewhat improved today. He last saw cardiology 1 month ago. He is recently started taking Uloric for gouty arthritis. This medication has helped control this flares. No active flares. He is following with the rheumatology. He says he is taking his 62 units of Lantus nightly, but will occasionally miss doses. He is due for an A1c check in one month. He would like some diabetic shoes. He's not having any chest pain, dizziness, dyspnea.  Past Medical History  Diagnosis Date  . Other and unspecified  hyperlipidemia   . Insomnia, unspecified   . Obstructive sleep apnea (adult) (pediatric)   . Allergic rhinitis, cause unspecified   . Nonischemic cardiomyopathy   . Unspecified essential hypertension   . Type II or unspecified type diabetes mellitus without mention of complication, not stated as uncontrolled   . CKD (chronic kidney disease) stage 3, GFR 30-59 ml/min   . Esophageal reflux   . Morbid obesity   . Dysuria   . Tobacco use disorder   . Dysrhythmia   . Pacemaker   . Antral ulcer   . Renal azotemia   . Arthritis     Gout w/hyperuricemia  . Morbid obesity    Current Outpatient Prescriptions  Medication Sig Dispense Refill  . febuxostat (ULORIC) 40 MG tablet Take 80 mg by mouth daily.      Marland Kitchen ACCU-CHEK COMPACT STRIPS test strip USE TO TEST BLOOD SUGAR UP TO 1 TIME A DAY  102 each  4  . ACCU-CHEK SOFTCLIX LANCETS lancets USE TO CHECK BLOOD SUGAR UP TO 1 TIME A DAY  100 each  3  . acetaminophen (TYLENOL) 325 MG tablet Take 650 mg by mouth every 6 (six) hours as needed. For pain      . B-D ULTRAFINE III SHORT PEN 31G X 8 MM MISC USE TO INJECT INSULIN AS DIRECTED  100 each  3  . colchicine 0.6 MG tablet Take 1 tablet (0.6 mg total) by mouth daily.  30 tablet  2  . docusate sodium (COLACE) 100 MG capsule Take 100 mg by mouth  daily.       . esomeprazole (NEXIUM) 20 MG capsule Take 1 capsule (20 mg total) by mouth daily before breakfast.  30 capsule  11  . ferrous sulfate 325 (65 FE) MG tablet Take 1 tablet (325 mg total) by mouth 2 (two) times daily with breakfast and lunch.  60 tablet  2  . fluticasone (FLONASE) 50 MCG/ACT nasal spray Place 1 spray into the nose daily as needed. For allergies      . insulin glargine (LANTUS SOLOSTAR) 100 UNIT/ML injection Inject 62 Units into the skin at bedtime.  30 mL  11  . loratadine (CLARITIN) 10 MG tablet TAKE 1 TABLET (10 MG TOTAL) BY MOUTH DAILY.  30 tablet  3  . LORazepam (ATIVAN) 0.5 MG tablet TAKE 1 TABLET BY MOUTH TWICE A DAY AS NEEDED   60 tablet  1  . metolazone (ZAROXOLYN) 10 MG tablet Take 1 tablet (10 mg total) by mouth daily.  30 tablet  5  . metoprolol (LOPRESSOR) 100 MG tablet TAKE 1 TABLET BY MOUTH TWICE A DAY  62 tablet  10  . predniSONE (STERAPRED UNI-PAK) 10 MG tablet Take 1 tablet (10 mg total) by mouth daily. Take 40 mg today, take 30 mg tomorrow ( on 2/5), Take 20 mg  on 2/6 , Take 10 mg  2/7 and  2/8.  11 tablet  0  . simvastatin (ZOCOR) 40 MG tablet TAKE 1 TABLET BY MOUTH DAILY  30 tablet  11  . spironolactone (ALDACTONE) 25 MG tablet Take 1 tablet (25 mg total) by mouth daily.  31 tablet  11  . torsemide (DEMADEX) 100 MG tablet Take 1 tablet (100 mg total) by mouth 2 (two) times daily.  60 tablet  5  . traMADol (ULTRAM) 50 MG tablet TAKE 1 TABLET BY MOUTH EVERY 6 HOURS AS NEEDED FOR PAIN  60 tablet  0  . VOLTAREN 1 % GEL APPLY 1 APPLICATION TOPICALLY 4 TIMES A DAY  100 g  1   No current facility-administered medications for this visit.   Family History  Problem Relation Age of Onset  . Diabetes Mother   . Microcephaly Father   . Lung cancer Father    History   Social History  . Marital Status: Single    Spouse Name: N/A    Number of Children: N/A  . Years of Education: N/A   Social History Main Topics  . Smoking status: Current Every Day Smoker -- 0.50 packs/day    Types: Cigarettes  . Smokeless tobacco: Never Used     Comment: started smoking at age 82. Will stop on his own - cutting back now- was smoking a ppd - now only 3 a day  . Alcohol Use: No     Comment: No alcohol x 1 month.  . Drug Use: No  . Sexually Active: Not on file   Other Topics Concern  . Not on file   Social History Narrative  . No narrative on file   Review of Systems: 10 pt ROS performed, pertinent positives and negatives noted in HPI Objective:  Physical Exam: Filed Vitals:   06/24/12 1432  BP: 109/74  Pulse: 91  Temp: 97.4 F (36.3 C)  TempSrc: Oral  Weight: 316 lb 14.4 oz (143.745 kg)  SpO2: 96%    Constitutional: Vital signs reviewed.  Overweight male, NAD. Alert and oriented x3.  Head: Normocephalic and atraumatic Ear: TM normal bilaterally. No erythema or effusions appreciated. Weber-Rinne test was performed with  tuning fork and was normal. No lateralization. Mouth: no erythema or exudates, MMM Eyes: PERRL, EOMI, conjunctivae normal Neck: Supple, Trachea midline normal ROM Cardiovascular: RRR, S1 normal, S2 normal, no MRG, pulses symmetric and intact bilaterally Pulmonary/Chest: CTAB, no wheezes, rales, or rhonchi Abdominal: Soft. Non-tender, non-distended, bowel sounds are normal, no masses, organomegaly, or guarding present.  Musculoskeletal: No synovitis, effusions, or erythema of bilateral upper and lower sugar the appendicular joints  Neurological: A&O x3, Strength is normal and symmetric bilaterally, cranial nerve II-XII are grossly intact, no focal motor deficit, sensory intact to light touch bilaterally.  Skin: Chronic venous stasis changes of bilateral lower legs. 2+ pitting edema up to the knees bilaterally.  Psychiatric: Normal mood and affect. Assessment & Plan:   Please see problem-based charting for assessment and plan.

## 2012-06-24 NOTE — Assessment & Plan Note (Signed)
Renal function continues to deteriorate. Most recent GFR 17, BUN 122. I think decreased appetite/nausea are related to uremia. He has been evaluated by vasc surgery for dialysis access. Instructed him to call [facility] and make appt to follow up again with Dr. Lowell Guitar to discuss initiating dialysis now that he has seen vasc surgery. May need to initiate sooner as suspect worsening uremia.

## 2012-06-24 NOTE — Patient Instructions (Signed)
1. Keep taking your lantus, fluid pillls, and gout medicine. 2. You will need to follow up with Dr. Lowell Guitar about your kidney function. Call [facility] to schedule your next appointment. 3. Come back and see me in 1 month. 4. Call our office if you have worsening hearing loss, ear pain, numbness, dizziness, fever, chills, drainage from ear.

## 2012-06-24 NOTE — Assessment & Plan Note (Signed)
Follows w rheumatology. No active flares. Gouty pain w much better control since staring febuxostat. Still on colchicine.

## 2012-06-24 NOTE — Assessment & Plan Note (Signed)
Emphasized importance of NOT MISSING any lantus doses. On 62U nightly. Will come back in 1 month for repeat HbA1c.

## 2012-06-25 NOTE — Progress Notes (Signed)
Case discussed with Dr. Heloise Beecham soon after the resident saw the patient.  We reviewed the resident's history and exam and pertinent patient test results.  I agree with the assessment, diagnosis and plan of care documented in the resident's note.

## 2012-06-29 ENCOUNTER — Encounter: Payer: Self-pay | Admitting: Internal Medicine

## 2012-07-06 ENCOUNTER — Encounter: Payer: Self-pay | Admitting: Dietician

## 2012-07-15 ENCOUNTER — Encounter: Payer: Self-pay | Admitting: Internal Medicine

## 2012-07-23 ENCOUNTER — Ambulatory Visit: Payer: Medicaid Other | Admitting: Internal Medicine

## 2012-07-27 ENCOUNTER — Encounter: Payer: Self-pay | Admitting: Internal Medicine

## 2012-07-27 ENCOUNTER — Ambulatory Visit (INDEPENDENT_AMBULATORY_CARE_PROVIDER_SITE_OTHER): Payer: Medicaid Other | Admitting: Internal Medicine

## 2012-07-27 VITALS — BP 103/72 | HR 104 | Temp 97.2°F | Ht 73.0 in | Wt 316.4 lb

## 2012-07-27 DIAGNOSIS — E1129 Type 2 diabetes mellitus with other diabetic kidney complication: Secondary | ICD-10-CM

## 2012-07-27 DIAGNOSIS — N184 Chronic kidney disease, stage 4 (severe): Secondary | ICD-10-CM

## 2012-07-27 LAB — GLUCOSE, CAPILLARY: Glucose-Capillary: 187 mg/dL — ABNORMAL HIGH (ref 70–99)

## 2012-07-27 NOTE — Assessment & Plan Note (Signed)
Discussed importance of taking Lantus doses regularly. Patient was instructed to return to clinic in 1 month with glucometer log, measuring sugar before breakfast and 1-2 hours after mealtime. No change to insulin dosing at this time.

## 2012-07-27 NOTE — Progress Notes (Deleted)
Patient ID: Adam [name], male   DOB: 12/04/1965, 47 y.o.   MRN: [id number]

## 2012-07-27 NOTE — Patient Instructions (Addendum)
Please return in 1 month with your glucometer log. Please measure your fasting blood sugars and 1-2 hours after mealtime.  We are not changing your insulin dose today.

## 2012-07-27 NOTE — Progress Notes (Signed)
Patient ID: Adam [name], male   DOB: September 21, 1965, 47 y.o.   MRN: [id number]  Subjective:   Patient ID: Adam [name] male   DOB: 27-Mar-1965 47 y.o.   MRN: [id number]  HPI: [name] is a 47 y.o. male with past medical history of stage IV CKD, CHF with ejection fraction 25%, obstructive sleep apnea, hypertension, gout, and type 2 diabetes presenting today for diabetes follow up. The patient claims to check his blood sugar in the mornings regularly, but today his HbA1c is 9.1, higher than his last visit. He still claims to have to wake up in the night to go to the bathroom at least 2 or 3 times nightly. He denies any numbness or tingling in his feet and did get new shoes since his last visit. He claims to check his feet for sores and ulcers regularly. The patient also denies any change in vision. He says he is taking his 62 units of Lantus nightly. Sometimes he will fall asleep without taking it, but takes it at 5 AM. He is recently started taking Uloric for gouty arthritis. This medication has helped control this flares. He does report having a flare a couple of weeks ago. He is following with the rheumatology, who he saw last month. He reports compliance with diuretic therapy for his nonischemic cardiomyopathy. His blood pressure is currently under control and he claims his fluid problems/edema are better than his last visit. Denies any chest pain, fever, diarrhea, cough, but does claim to have SOB with exertion.  Past Medical History  Diagnosis Date  . Other and unspecified hyperlipidemia   . Insomnia, unspecified   . Obstructive sleep apnea (adult) (pediatric)   . Allergic rhinitis, cause unspecified   . Nonischemic cardiomyopathy   . Unspecified essential hypertension   . Type II or unspecified type diabetes mellitus without mention of complication, not stated as uncontrolled   . CKD (chronic kidney disease) stage 3, GFR 30-59 ml/min   . Esophageal reflux   . Morbid obesity   . Dysuria   .  Tobacco use disorder   . Dysrhythmia   . Pacemaker   . Antral ulcer   . Renal azotemia   . Arthritis     Gout w/hyperuricemia  . Morbid obesity    Current Outpatient Prescriptions  Medication Sig Dispense Refill  . ACCU-CHEK COMPACT STRIPS test strip USE TO TEST BLOOD SUGAR UP TO 1 TIME A DAY  102 each  4  . ACCU-CHEK SOFTCLIX LANCETS lancets USE TO CHECK BLOOD SUGAR UP TO 1 TIME A DAY  100 each  3  . acetaminophen (TYLENOL) 325 MG tablet Take 650 mg by mouth every 6 (six) hours as needed. For pain      . B-D ULTRAFINE III SHORT PEN 31G X 8 MM MISC USE TO INJECT INSULIN AS DIRECTED  100 each  3  . colchicine 0.6 MG tablet Take 1 tablet (0.6 mg total) by mouth daily.  30 tablet  2  . esomeprazole (NEXIUM) 20 MG capsule Take 1 capsule (20 mg total) by mouth daily before breakfast.  30 capsule  11  . febuxostat (ULORIC) 40 MG tablet Take 80 mg by mouth daily.      . fluticasone (FLONASE) 50 MCG/ACT nasal spray Place 1 spray into the nose daily as needed. For allergies      . insulin glargine (LANTUS SOLOSTAR) 100 UNIT/ML injection Inject 62 Units into the skin at bedtime.  30 mL  11  . loratadine (CLARITIN)  10 MG tablet TAKE 1 TABLET (10 MG TOTAL) BY MOUTH DAILY.  30 tablet  3  . LORazepam (ATIVAN) 0.5 MG tablet TAKE 1 TABLET BY MOUTH TWICE A DAY AS NEEDED  60 tablet  1  . metolazone (ZAROXOLYN) 10 MG tablet Take 1 tablet (10 mg total) by mouth daily.  30 tablet  5  . metoprolol (LOPRESSOR) 100 MG tablet TAKE 1 TABLET BY MOUTH TWICE A DAY  62 tablet  10  . simvastatin (ZOCOR) 40 MG tablet TAKE 1 TABLET BY MOUTH DAILY  30 tablet  11  . spironolactone (ALDACTONE) 25 MG tablet Take 1 tablet (25 mg total) by mouth daily.  31 tablet  11  . torsemide (DEMADEX) 100 MG tablet Take 1 tablet (100 mg total) by mouth 2 (two) times daily.  60 tablet  5  . VOLTAREN 1 % GEL APPLY 1 APPLICATION TOPICALLY 4 TIMES A DAY  100 g  1  . docusate sodium (COLACE) 100 MG capsule Take 100 mg by mouth daily.       .  ferrous sulfate 325 (65 FE) MG tablet Take 1 tablet (325 mg total) by mouth 2 (two) times daily with breakfast and lunch.  60 tablet  2  . predniSONE (STERAPRED UNI-PAK) 10 MG tablet Take 1 tablet (10 mg total) by mouth daily. Take 40 mg today, take 30 mg tomorrow ( on 2/5), Take 20 mg  on 2/6 , Take 10 mg  2/7 and  2/8.  11 tablet  0  . traMADol (ULTRAM) 50 MG tablet TAKE 1 TABLET BY MOUTH EVERY 6 HOURS AS NEEDED FOR PAIN  60 tablet  0   No current facility-administered medications for this visit.   Family History  Problem Relation Age of Onset  . Diabetes Mother   . Microcephaly Father   . Lung cancer Father    History   Social History  . Marital Status: Single    Spouse Name: N/A    Number of Children: N/A  . Years of Education: N/A   Social History Main Topics  . Smoking status: Current Every Day Smoker -- 0.50 packs/day    Types: Cigarettes  . Smokeless tobacco: Never Used     Comment: started smoking at age 19. Will stop on his own - cutting back now- was smoking a ppd - now only 3 a day  . Alcohol Use: No     Comment: No alcohol x 1 month.  . Drug Use: No  . Sexually Active: None   Other Topics Concern  . None   Social History Narrative  . None   Review of Systems: 10 pt ROS performed, pertinent positives and negatives noted in HPI Objective:  Physical Exam: Filed Vitals:   07/27/12 1333  BP: 103/72  Pulse: 104  Temp: 97.2 F (36.2 C)  TempSrc: Oral  Height: 6\' 1"  (1.854 m)  Weight: 316 lb 6.4 oz (143.518 kg)  SpO2: 98%   Constitutional: Vital signs reviewed.  Overweight male, NAD. Alert and oriented x3.  Head: Normocephalic and atraumatic Ear: TM normal bilaterally. No erythema or effusions appreciated. Mouth: no erythema or exudates, MMM Eyes: PERRL, EOMI, conjunctivae normal Neck: Supple, Trachea midline normal ROM Cardiovascular: RRR, S1 normal, S2 normal, pulses symmetric and intact bilaterally Pulmonary/Chest: CTAB, no wheezes, rales, or  rhonchi Abdominal: Soft. Non-tender, non-distended, bowel sounds are normal, no masses, organomegaly, or guarding present.  Musculoskeletal: No synovitis, effusions, or erythema of bilateral upper and lower appendicular joints  Neurological:  A&O x3, Strength is normal and symmetric bilaterally, cranial nerve II-XII are grossly intact, no focal motor deficit, sensory intact to light touch bilaterally.  Skin: Chronic venous stasis changes of bilateral lower legs. 2+ pitting edema up to the knees bilaterally. Varicosities present on medial portion of left ankle. Psychiatric: Normal mood and affect. Assessment & Plan:

## 2012-07-27 NOTE — Progress Notes (Deleted)
Subjective:   Patient ID: Adam [name] male   DOB: 02-23-1965 47 y.o.   MRN: [id number]  HPI: Adam [name] is a 47 y.o.     Past Medical History  Diagnosis Date  . Other and unspecified hyperlipidemia   . Insomnia, unspecified   . Obstructive sleep apnea (adult) (pediatric)   . Allergic rhinitis, cause unspecified   . Nonischemic cardiomyopathy   . Unspecified essential hypertension   . Type II or unspecified type diabetes mellitus without mention of complication, not stated as uncontrolled   . CKD (chronic kidney disease) stage 3, GFR 30-59 ml/min   . Esophageal reflux   . Morbid obesity   . Dysuria   . Tobacco use disorder   . Dysrhythmia   . Pacemaker   . Antral ulcer   . Renal azotemia   . Arthritis     Gout w/hyperuricemia  . Morbid obesity    Current Outpatient Prescriptions  Medication Sig Dispense Refill  . ACCU-CHEK COMPACT STRIPS test strip USE TO TEST BLOOD SUGAR UP TO 1 TIME A DAY  102 each  4  . ACCU-CHEK SOFTCLIX LANCETS lancets USE TO CHECK BLOOD SUGAR UP TO 1 TIME A DAY  100 each  3  . acetaminophen (TYLENOL) 325 MG tablet Take 650 mg by mouth every 6 (six) hours as needed. For pain      . B-D ULTRAFINE III SHORT PEN 31G X 8 MM MISC USE TO INJECT INSULIN AS DIRECTED  100 each  3  . colchicine 0.6 MG tablet Take 1 tablet (0.6 mg total) by mouth daily.  30 tablet  2  . docusate sodium (COLACE) 100 MG capsule Take 100 mg by mouth daily.       Marland Kitchen esomeprazole (NEXIUM) 20 MG capsule Take 1 capsule (20 mg total) by mouth daily before breakfast.  30 capsule  11  . febuxostat (ULORIC) 40 MG tablet Take 80 mg by mouth daily.      . ferrous sulfate 325 (65 FE) MG tablet Take 1 tablet (325 mg total) by mouth 2 (two) times daily with breakfast and lunch.  60 tablet  2  . fluticasone (FLONASE) 50 MCG/ACT nasal spray Place 1 spray into the nose daily as needed. For allergies      . insulin glargine (LANTUS SOLOSTAR) 100 UNIT/ML injection Inject 62 Units into the skin at  bedtime.  30 mL  11  . loratadine (CLARITIN) 10 MG tablet TAKE 1 TABLET (10 MG TOTAL) BY MOUTH DAILY.  30 tablet  3  . LORazepam (ATIVAN) 0.5 MG tablet TAKE 1 TABLET BY MOUTH TWICE A DAY AS NEEDED  60 tablet  1  . metolazone (ZAROXOLYN) 10 MG tablet Take 1 tablet (10 mg total) by mouth daily.  30 tablet  5  . metoprolol (LOPRESSOR) 100 MG tablet TAKE 1 TABLET BY MOUTH TWICE A DAY  62 tablet  10  . predniSONE (STERAPRED UNI-PAK) 10 MG tablet Take 1 tablet (10 mg total) by mouth daily. Take 40 mg today, take 30 mg tomorrow ( on 2/5), Take 20 mg  on 2/6 , Take 10 mg  2/7 and  2/8.  11 tablet  0  . simvastatin (ZOCOR) 40 MG tablet TAKE 1 TABLET BY MOUTH DAILY  30 tablet  11  . spironolactone (ALDACTONE) 25 MG tablet Take 1 tablet (25 mg total) by mouth daily.  31 tablet  11  . torsemide (DEMADEX) 100 MG tablet Take 1 tablet (100 mg total) by mouth  2 (two) times daily.  60 tablet  5  . traMADol (ULTRAM) 50 MG tablet TAKE 1 TABLET BY MOUTH EVERY 6 HOURS AS NEEDED FOR PAIN  60 tablet  0  . VOLTAREN 1 % GEL APPLY 1 APPLICATION TOPICALLY 4 TIMES A DAY  100 g  1   No current facility-administered medications for this visit.   Family History  Problem Relation Age of Onset  . Diabetes Mother   . Microcephaly Father   . Lung cancer Father    History   Social History  . Marital Status: Single    Spouse Name: N/A    Number of Children: N/A  . Years of Education: N/A   Social History Main Topics  . Smoking status: Current Every Day Smoker -- 0.50 packs/day    Types: Cigarettes  . Smokeless tobacco: Never Used     Comment: started smoking at age 71. Will stop on his own - cutting back now- was smoking a ppd - now only 3 a day  . Alcohol Use: No     Comment: No alcohol x 1 month.  . Drug Use: No  . Sexually Active: Not on file   Other Topics Concern  . Not on file   Social History Narrative  . No narrative on file   Review of Systems: {Review Of Systems:30496} Objective:  Physical  Exam: There were no vitals filed for this visit. {Exam, Complete:17964} Assessment & Plan:

## 2012-07-27 NOTE — Assessment & Plan Note (Addendum)
Patient still has significant edema in the lower extremities and his last creatinine was 3.26. He says his symptoms are unchanged since his last visit. Patient was advised to adhere to his medications.

## 2012-08-02 NOTE — Progress Notes (Signed)
[name] is a 52M who follows with [place] for DM, among other things. He has not brought his glucometer log today and it is difficult to advise him on his blood sugars without his log. We have advised him to bring to the log next visit. He keeps same insulin dosage. He denies any symptoms of hypoglycemia.  For his leg swelling, likely secondary to hypoalbuminemia and CKD. He is following [facility] for the same and is being prepared for dialysis per previous notes.   I saw and evaluated the patient with Dr. Yetta Barre.  I personally confirmed the key portions of the history and exam documented by Dr. Yetta Barre and I reviewed pertinent patient test results.  The assessment, diagnosis, and plan were formulated together and I agree with the documentation in the resident's note.

## 2012-08-04 ENCOUNTER — Telehealth: Payer: Self-pay | Admitting: *Deleted

## 2012-08-04 ENCOUNTER — Other Ambulatory Visit: Payer: Self-pay | Admitting: Internal Medicine

## 2012-08-04 NOTE — Telephone Encounter (Signed)
Pt called - right ear is worse. Problems with balance. Has been going on for several weeks. Offered appt - wants clinic to call in antibiotics - pt states was recently in clinic. Pt wants to speck to [facility] in clinic - message given and she will call pt. [facility] Smiley Birr RN 08/04/12 8:40AM

## 2012-08-04 NOTE — Telephone Encounter (Signed)
Call from pt with c/o ear pain.  Pt is requesting a antibiotic.  Pt was informed that the doctor will need to see him prior to getting an Antibiotic.  When asked about fevers patient said no.  Pt denied drainage.  Unable to come in today for an appointment to assess his ear pain.  Pt was given an appointment to come to the Clinic tomorrow morning for an appointment.  Angelina Ok, RN 08/04/2012 8:44 AM

## 2012-08-05 ENCOUNTER — Telehealth: Payer: Self-pay | Admitting: Internal Medicine

## 2012-08-05 ENCOUNTER — Ambulatory Visit (INDEPENDENT_AMBULATORY_CARE_PROVIDER_SITE_OTHER): Payer: Medicaid Other | Admitting: Internal Medicine

## 2012-08-05 ENCOUNTER — Other Ambulatory Visit: Payer: Self-pay

## 2012-08-05 ENCOUNTER — Encounter: Payer: Self-pay | Admitting: Internal Medicine

## 2012-08-05 VITALS — BP 90/70 | HR 89 | Ht 73.0 in | Wt 317.0 lb

## 2012-08-05 DIAGNOSIS — I5022 Chronic systolic (congestive) heart failure: Secondary | ICD-10-CM

## 2012-08-05 DIAGNOSIS — H60399 Other infective otitis externa, unspecified ear: Secondary | ICD-10-CM

## 2012-08-05 DIAGNOSIS — E1165 Type 2 diabetes mellitus with hyperglycemia: Secondary | ICD-10-CM

## 2012-08-05 DIAGNOSIS — H6091 Unspecified otitis externa, right ear: Secondary | ICD-10-CM

## 2012-08-05 DIAGNOSIS — I959 Hypotension, unspecified: Secondary | ICD-10-CM

## 2012-08-05 DIAGNOSIS — E1129 Type 2 diabetes mellitus with other diabetic kidney complication: Secondary | ICD-10-CM

## 2012-08-05 DIAGNOSIS — I10 Essential (primary) hypertension: Secondary | ICD-10-CM

## 2012-08-05 LAB — GLUCOSE, CAPILLARY: Glucose-Capillary: 164 mg/dL — ABNORMAL HIGH (ref 70–99)

## 2012-08-05 MED ORDER — HYDROCORTISONE-ACETIC ACID 1-2 % OT SOLN: 3.0000 [drp] | Freq: Four times a day (QID) | OTIC | Status: DC | PRN

## 2012-08-05 MED ORDER — CIPROFLOXACIN HCL 500 MG PO TABS: 500.0000 mg | ORAL_TABLET | Freq: Two times a day (BID) | ORAL | Status: AC

## 2012-08-05 NOTE — Patient Instructions (Addendum)
General Instructions: Start taking ciprofloxacin antibiotics 500mg  by mouth twice a day for 7 days  Try the acetic acid hydrocortisone drops for your right ear as prescribed  If you notice no improvement or if the pain gets worse, call the clinic right away and we will need to refer you to ENT specialist  Your blood pressure was low today and you are feeling dizzy at times.  Please DO NOT TAKE YOUR METOLOZONE until you see your cardiologist next week.  Keep track of your blood pressure at home and call our office or your cardiologist office to let them know. If your dizziness gets worse, or you experience headaches, feeling light-headed or like you will faint or if you do faint, go directly to the emergency room  Also, if you notice worsening shortness of breath or chest pain or difficulty breathing, you will need to speak to your pcp, cardiologist and go to the emergency room  Please bring your meter to your next visit and keep checking your sugars and take insulin as prescribed   Treatment Goals:  Goals (1 Years of Data) as of 08/05/12         As of Today As of Today As of Today 07/27/12 06/24/12     Blood Pressure    . Blood Pressure < 140/90  90/70 90/74 90/60  103/72 109/74       Note edited  07/27/2012  2:29 PM by Lars Masson, MD   Adam [name] has maintained his blood pressure at a low level, today it was 103/72.             Result Component    . HEMOGLOBIN A1C < 7.0     9.1        Note edited  07/27/2012  5:57 PM by Lars Masson, MD   Today, Adam [name] had a HbA1c of 9.1, higher than his last visit. We discussed medication compliance, diet, and exercise. He has also been asked to return during the next visit with glucometer readings from 2 times per day.           Marland Kitchen LDL CALC < 100            Progress Toward Treatment Goals:  Treatment Goal 08/05/2012  Hemoglobin A1C unable to assess  Blood pressure -  Stop smoking smoking the same amount    Self Care Goals & Plans:  Self Care  Goal 08/05/2012  Manage my medications take my medicines as prescribed; bring my medications to every visit  Monitor my health keep track of my blood glucose  Eat healthy foods -  Be physically active -  Stop smoking call [facility] (1-800-QUIT-NOW)  Meeting treatment goals -    Home Blood Glucose Monitoring 08/05/2012  Check my blood sugar 3 times a day  When to check my blood sugar before meals     Care Management & Community Referrals:  Referral 07/27/2012  Referrals made for care management support (No Data)     Otitis Externa Otitis externa is a germ infection in the outer ear. The outer ear is the area from the eardrum to the outside of the ear. Otitis externa is sometimes called "swimmer's ear." HOME CARE  Put drops in the ear as told by your doctor.  Only take medicine as told by your doctor.  If you have diabetes, your doctor may give you more directions. Follow your doctor's directions.  Keep all doctor visits as told. To avoid another infection:  Keep your  ear dry. Use the corner of a towel to dry your ear after swimming or bathing.  Avoid scratching or putting things inside your ear.  Avoid swimming in lakes, dirty water, or pools that use a chemical called chlorine poorly.  You may use ear drops after swimming. Combine equal amounts of white vinegar and alcohol in a bottle. Put 3 or 4 drops in each ear. GET HELP RIGHT AWAY IF:   You have a fever.  Your ear is still red, puffy (swollen), or painful after 3 days.  You still have yellowish-white fluid (pus) coming from the ear after 3 days.  Your redness, puffiness, or pain gets worse.  You have a really bad headache.  You have redness, puffiness, pain, or tenderness behind your ear. MAKE SURE YOU:   Understand these instructions.  Will watch your condition.  Will get help right away if you are not doing well or get worse. Document Released: 07/02/2007 Document Revised: 04/07/2011 Document Reviewed:  01/30/2011 [facility] Patient Information 2014 [place], [place].  Ciprofloxacin tablets What is this medicine? CIPROFLOXACIN (sip roe FLOX a sin) is a quinolone antibiotic. It is used to treat certain kinds of bacterial infections. It will not work for colds, flu, or other viral infections. This medicine may be used for other purposes; ask your health care provider or pharmacist if you have questions. What should I tell my health care provider before I take this medicine? They need to know if you have any of these conditions: -heart condition -joint problems -kidney disease -liver disease -myasthenia gravis -seizure disorder -an unusual or allergic reaction to ciprofloxacin, other antibiotics or medicines, foods, dyes, or preservatives -pregnant or trying to get pregnant -breast-feeding How should I use this medicine? Take this medicine by mouth with a glass of water. Follow the directions on the prescription label. Take your medicine at regular intervals. Do not take your medicine more often than directed. Take all of your medicine as directed even if you think your are better. Do not skip doses or stop your medicine early. You can take this medicine with food or on an empty stomach. It can be taken with a meal that contains dairy or calcium, but do not take it alone with a dairy product, like milk or yogurt or calcium-fortified juice. A special MedGuide will be given to you by the pharmacist with each prescription and refill. Be sure to read this information carefully each time. Talk to your pediatrician regarding the use of this medicine in children. Special care may be needed. Overdosage: If you think you have taken too much of this medicine contact a poison control center or emergency room at once. NOTE: This medicine is only for you. Do not share this medicine with others. What if I miss a dose? If you miss a dose, take it as soon as you can. If it is almost time for your next dose, take  only that dose. Do not take double or extra doses. What may interact with this medicine? Do not take this medicine with any of the following medications: -cisapride -droperidol -terfenadine -tizanidine This medicine may also interact with the following medications: -antacids -caffeine -cyclosporin -didanosine (ddI) buffered tablets or powder -medicines for diabetes -medicines for inflammation like ibuprofen, naproxen -methotrexate -multivitamins -omeprazole -phenytoin -probenecid -sucralfate -theophylline -warfarin This list may not describe all possible interactions. Give your health care provider a list of all the medicines, herbs, non-prescription drugs, or dietary supplements you use. Also tell them if you smoke, drink  alcohol, or use illegal drugs. Some items may interact with your medicine. What should I watch for while using this medicine? Tell your doctor or health care professional if your symptoms do not improve. Do not treat diarrhea with over the counter products. Contact your doctor if you have diarrhea that lasts more than 2 days or if it is severe and watery. You may get drowsy or dizzy. Do not drive, use machinery, or do anything that needs mental alertness until you know how this medicine affects you. Do not stand or sit up quickly, especially if you are an older patient. This reduces the risk of dizzy or fainting spells. This medicine can make you more sensitive to the sun. Keep out of the sun. If you cannot avoid being in the sun, wear protective clothing and use sunscreen. Do not use sun lamps or tanning beds/booths. Avoid antacids, aluminum, calcium, iron, magnesium, and zinc products for 6 hours before and 2 hours after taking a dose of this medicine. What side effects may I notice from receiving this medicine? Side effects that you should report to your doctor or health care professional as soon as possible: -allergic reactions like skin rash, itching or hives,  swelling of the face, lips, or tongue -breathing problems -confusion, nightmares or hallucinations -feeling faint or lightheaded, falls -irregular heartbeat -joint, muscle or tendon pain or swelling -pain or trouble passing urine -redness, blistering, peeling or loosening of the skin, including inside the mouth -seizure -unusual pain, numbness, tingling, or weakness Side effects that usually do not require medical attention (report to your doctor or health care professional if they continue or are bothersome): -diarrhea -nausea or stomach upset -white patches or sores in the mouth This list may not describe all possible side effects. Call your doctor for medical advice about side effects. You may report side effects to FDA at 1-800-FDA-1088. Where should I keep my medicine? Keep out of the reach of children. Store at room temperature below 30 degrees C (86 degrees F). Keep container tightly closed. Throw away any unused medicine after the expiration date. NOTE: This sheet is a summary. It may not cover all possible information. If you have questions about this medicine, talk to your doctor, pharmacist, or health care provider.  2012, Elsevier/Gold Standard. (04/02/2009 11:41:11 AM)  Acetic Acid; Hydrocortisone Ear Solution What is this medicine? ACETIC ACID; HYDROCORTISONE (a SEE tik AS id; hye droe KOR ti sone) is used to treat outer ear infections. This medicine may be used for other purposes; ask your health care provider or pharmacist if you have questions. What should I tell my health care provider before I take this medicine? They need to know if you have any of these conditions: -herpes infection -ruptured ear drum -vaccinia (the skin blister that occurs after a smallpox vaccine) -an unusual or allergic reaction to acetic acid, hydrocortisone, propylene glycol, other medicines, foods, dyes, or preservatives -pregnant or trying to get pregnant -breast-feeding How should I use  this medicine? This medicine is only for use in your ears. Follow the directions on the prescription label. Wash your hands with soap and water. First, carefully clean your ear(s) with a dry cotton swab. Gently warm the bottle by holding it in the hand for 1 to 2 minutes. Use the ear solution as directed by your doctor or health care professional. Lorenz Coaster down on your side with the affected ear up. Try not to touch the tip of the dropper to your ear, fingertips, or other surface.  Squeeze the bottle gently to put the prescribed number of drops in the ear canal. Stay in this position for 30 to 60 seconds to help the drops soak into the ear. Repeat, if necessary, for the opposite ear. Do not use your medicine more often than directed. Finish the full course of medicine prescribed by your health care professional even if you think your condition is better. Talk to your pediatrician regarding the use of this medicine in children. While this drug may be prescribed for children as young as 60 years of age and older for selected conditions, precautions do apply. Overdosage: If you think you have taken too much of this medicine contact a poison control center or emergency room at once. NOTE: This medicine is only for you. Do not share this medicine with others. What if I miss a dose? If you miss a dose, use it as soon as you can. If it is almost time for your next dose, use only that dose. Do not use double or extra doses. What may interact with this medicine? Interactions are not expected. Do not use other ear products without talking to your doctor or health care professional. This list may not describe all possible interactions. Give your health care provider a list of all the medicines, herbs, non-prescription drugs, or dietary supplements you use. Also tell them if you smoke, drink alcohol, or use illegal drugs. Some items may interact with your medicine. What should I watch for while using this medicine? Tell  your doctor or health care professional if your ear infection does not get better in a few days. If a rash or allergic reaction occurs, stop using this product right away and contact your doctor or health care professional. It is important that you keep the infected ear(s) clean and dry. When bathing, try not to get the infected ear(s) wet. Do not go swimming unless your doctor or health care professional has told you otherwise. To prevent the spread of infection, do not share ear products or share towels and washcloths with anyone else. What side effects may I notice from receiving this medicine? Side effects that you should report to your doctor or health care professional as soon as possible: -allergic reactions like skin rash, itching or hives, swelling of the face, lips, or tongue -burning and redness -worsening ear pain Side effects that usually do not require medical attention (report to your doctor or health care professional if they continue or are bothersome): -unpleasant feeling while putting the drops in the ear This list may not describe all possible side effects. Call your doctor for medical advice about side effects. You may report side effects to FDA at 1-800-FDA-1088. Where should I keep my medicine? Keep out of the reach of children. Store at room temperature between 15 and 30 degrees C (59 and 86 degrees F). Do not freeze. Protect from light. Throw away any unused medicine after the expiration date. NOTE: This sheet is a summary. It may not cover all possible information. If you have questions about this medicine, talk to your doctor, pharmacist, or health care provider.  2012, Elsevier/Gold Standard. (04/08/2007 10:56:41 AM)

## 2012-08-05 NOTE — Assessment & Plan Note (Addendum)
BP Readings from Last 3 Encounters:  08/05/12 90/70  07/27/12 103/72  06/24/12 109/74   Lab Results  Component Value Date   NA 138 04/15/2012   K 3.4* 04/15/2012   CREATININE 3.26* 04/15/2012   Assessment: Blood pressure control:  (hypotensive today) Progress toward BP goal:    Comments: did not take any of his medications this morning.  Recommend to hold metolazone until cardiology follow up.  Provided home BP log book, recommended daily checks of blood pressure and calling the clinic if persistently low or if symptoms of headaches, dizziness, light-headedness, syncope, fever, chills, diaphoresis, chest pain, shortness of breath.  He voices understanding and was able to tell me what medicine we are holding and also agreed to call cardiology to move up his appointment.    I also called Dr. Lubertha Basque office to notify of the medication change and hypotension and they will get in touch with patient for hopeful next week early follow up  Plan: Medications:  continue lopressor, aldactone, and torsemide.  HOLD METOLAZONE until cardiology follow up and BP improvement Educational resources provided:   Self management tools provided: home blood pressure logbook Caution with fluid intake and balance of diuretics given poor EF of heart failure

## 2012-08-05 NOTE — Assessment & Plan Note (Addendum)
Follows with Dr. Ladona Ridgel.  EF 25% in December 2013.  On lopressor, aldactone, torsemide, and metolazone.  Reports increase in DOE, weight today 317lbs up from 05/2012.  Hypotensive today despite not taking any of his medications.  Reports dizziness upon exertion and standing up quickly that spontaneously resolves at rest.  Not orthostatic in clinic today but remained hypotensive 90s/70s on recheck.  -discussed with Dr. Dalphine Handing, will hold metolazone for now until cards follow up hopefully next week.  Also touched base with Dr. Lubertha Basque office today who will get in touch with patient for next available appointment.  I did let receptionist know that I held metolazone in the mean time -f/u cardiology

## 2012-08-05 NOTE — Assessment & Plan Note (Signed)
90/70s today in clinic and did not take medicine.  On 3 diuretics at home with severe heart failure.  Please see HTN discussion.    -held metolazone until cardiology follow up -instructed to check BP at home and call clinic with results -cautioned on alarm symptoms including dizziness, light-headedness, syncope, nausea, vomiting, fever, chest pain, to call clinic or cardiologist office or if severe go to ED

## 2012-08-05 NOTE — Telephone Encounter (Signed)
New Prob     Dr. Virgina Organ wanted to notify Dr. Tenny Craw that she took pt off of METALOZONE. Notes in EPIC.

## 2012-08-05 NOTE — Telephone Encounter (Signed)
Will forward to Dr Tenny Craw for review, appointment 08/06/12

## 2012-08-05 NOTE — Assessment & Plan Note (Signed)
Lab Results  Component Value Date   HGBA1C 9.1 07/27/2012   HGBA1C 8.6 04/15/2012   HGBA1C 7.0 02/10/2012    Assessment: Diabetes control: poor control (HgbA1C >9%) Progress toward A1C goal:  unable to assess Comments: did not bring meter to visit again, last A1C 9.1 1 week prior with pcp  Plan: Medications:  continue current medications lantus 62 units qhs Home glucose monitoring: Frequency: 3 times a day Timing: before meals Instruction/counseling given: reminded to get eye exam, reminded to bring blood glucose meter & log to each visit, reminded to bring medications to each visit, discussed foot care, discussed the need for weight loss and discussed diet Educational resources provided:   Self management tools provided: copy of home glucose meter download

## 2012-08-05 NOTE — Progress Notes (Signed)
Subjective:   Patient ID: Adam [name] male   DOB: 1965-06-14 47 y.o.   MRN: [id number]  HPI: [name] is a 47 y.o. male with PMH of stage IV CKD, CHF with ejection fraction 25%, obstructive sleep apnea, hypertension, gout, and DM2 presenting to clinic today for an acute visit.  He is complaining of continuous right ear discomfort and hearing loss x1 month.  He also endorses productive intermittent cough with white and yellow sputum.  He says he has tried sweet oil and drops to remove wax from his ears but to no relief and he feels like sometimes it all gets stuck in there.  The pain and discomfort is getting worse he claims.  He denise any fever, chills, headaches, sore throat, nausea, or vomiting.  He has no complaints in his left ear.   Additionally, Mr. [name] also reports dizziness, especially when it is hot outside or when he stands up quickly.  He says the dizziness subsides quickly at rest.  Today, his blood pressure is lower than usual 91/68 and he has not taken any of his medications this morning.  He was NOT orthostatic when checking vital signs.  he reports compliance with his cardiac medications and thinks he has an appointment with Dr. Ladona Ridgel next week, although the only appointment I see on epic is for August.  He does endorse mild worsening of his DOE lately and his weight is 317 today, up from 310 in May 2014. He denies any chest pain.    Of note, he was recently seen by his PCP on 07/27/12 for diabetes follow up, noted to have poor glycemic control.  He did not bring his meter in to his visit again today.  I counseled him again on adherence to diabetic regimen and improving control. We also discussed smoking cessation and his need to stop, he is not very motivated to quit at this time.    Past Medical History  Diagnosis Date  . Other and unspecified hyperlipidemia   . Insomnia, unspecified   . Obstructive sleep apnea (adult) (pediatric)   . Allergic rhinitis, cause unspecified   .  Nonischemic cardiomyopathy   . Unspecified essential hypertension   . Type II or unspecified type diabetes mellitus without mention of complication, not stated as uncontrolled   . CKD (chronic kidney disease) stage 3, GFR 30-59 ml/min   . Esophageal reflux   . Morbid obesity   . Dysuria   . Tobacco use disorder   . Dysrhythmia   . Pacemaker   . Antral ulcer   . Renal azotemia   . Arthritis     Gout w/hyperuricemia  . Morbid obesity    Current Outpatient Prescriptions  Medication Sig Dispense Refill  . ACCU-CHEK COMPACT STRIPS test strip USE TO TEST BLOOD SUGAR UP TO 1 TIME A DAY  102 each  4  . ACCU-CHEK SOFTCLIX LANCETS lancets USE TO CHECK BLOOD SUGAR UP TO 1 TIME A DAY  100 each  3  . acetaminophen (TYLENOL) 325 MG tablet Take 650 mg by mouth every 6 (six) hours as needed. For pain      . B-D ULTRAFINE III SHORT PEN 31G X 8 MM MISC USE TO INJECT INSULIN AS DIRECTED  100 each  3  . colchicine 0.6 MG tablet Take 1 tablet (0.6 mg total) by mouth daily.  30 tablet  2  . docusate sodium (COLACE) 100 MG capsule Take 100 mg by mouth daily.       Marland Kitchen  esomeprazole (NEXIUM) 20 MG capsule Take 1 capsule (20 mg total) by mouth daily before breakfast.  30 capsule  11  . febuxostat (ULORIC) 40 MG tablet Take 80 mg by mouth daily.      . ferrous sulfate 325 (65 FE) MG tablet Take 1 tablet (325 mg total) by mouth 2 (two) times daily with breakfast and lunch.  60 tablet  2  . fluticasone (FLONASE) 50 MCG/ACT nasal spray Place 1 spray into the nose daily as needed. For allergies      . insulin glargine (LANTUS SOLOSTAR) 100 UNIT/ML injection Inject 62 Units into the skin at bedtime.  30 mL  11  . loratadine (CLARITIN) 10 MG tablet TAKE 1 TABLET (10 MG TOTAL) BY MOUTH DAILY.  30 tablet  3  . LORazepam (ATIVAN) 0.5 MG tablet TAKE 1 TABLET BY MOUTH TWICE A DAY AS NEEDED  60 tablet  1  . metolazone (ZAROXOLYN) 10 MG tablet Take 1 tablet (10 mg total) by mouth daily.  30 tablet  5  . metoprolol (LOPRESSOR)  100 MG tablet TAKE 1 TABLET BY MOUTH TWICE A DAY  62 tablet  10  . predniSONE (STERAPRED UNI-PAK) 10 MG tablet Take 1 tablet (10 mg total) by mouth daily. Take 40 mg today, take 30 mg tomorrow ( on 2/5), Take 20 mg  on 2/6 , Take 10 mg  2/7 and  2/8.  11 tablet  0  . simvastatin (ZOCOR) 40 MG tablet TAKE 1 TABLET BY MOUTH DAILY  30 tablet  11  . spironolactone (ALDACTONE) 25 MG tablet Take 1 tablet (25 mg total) by mouth daily.  31 tablet  11  . torsemide (DEMADEX) 100 MG tablet TAKE 1 TABLET BY MOUTH TWICE A DAY  60 tablet  1  . traMADol (ULTRAM) 50 MG tablet TAKE 1 TABLET BY MOUTH EVERY 6 HOURS AS NEEDED FOR PAIN  60 tablet  0  . VOLTAREN 1 % GEL APPLY 1 APPLICATION TOPICALLY 4 TIMES A DAY  100 g  1   No current facility-administered medications for this visit.   Family History  Problem Relation Age of Onset  . Diabetes Mother   . Microcephaly Father   . Lung cancer Father    History   Social History  . Marital Status: Single    Spouse Name: N/A    Number of Children: N/A  . Years of Education: N/A   Social History Main Topics  . Smoking status: Current Every Day Smoker -- 0.50 packs/day    Types: Cigarettes  . Smokeless tobacco: Never Used     Comment: started smoking at age 50. Will stop on his own - cutting back now- was smoking a ppd - now only 3 a day  . Alcohol Use: No     Comment: No alcohol x 1 month.  . Drug Use: No  . Sexually Active: None   Other Topics Concern  . None   Social History Narrative  . None   Review of Systems:  Constitutional:  Denies fever, chills, diaphoresis, appetite change and fatigue.   HEENT:  R ear pain, decreased hearing on right side.  Denies congestion, sore throat, rhinorrhea, sneezing, mouth sores, trouble swallowing, neck pain   Respiratory:  SOB, DOE, productive cough.  Denies wheezing.   Cardiovascular:  Leg swelling.  Denies palpitations  Gastrointestinal:  Denies nausea, vomiting, abdominal pain, diarrhea, constipation, blood  in stool and abdominal distention.   Genitourinary:  Denies dysuria, urgency, frequency, hematuria, flank  pain and difficulty urinating.   Musculoskeletal:  Walks with cane. Denies myalgias, back pain, joint swelling, arthralgias.   Skin:  Denies pallor, rash and wound.   Neurological:  Denies dizziness, seizures, syncope, weakness, light-headedness, numbness and headaches.    Objective:  Physical Exam: Filed Vitals:   08/05/12 0845 08/05/12 0957 08/05/12 0958 08/05/12 0959  BP:  90/60 90/74 90/70   Pulse:  82 75 89  Height: 6\' 1"  (1.854 m)     Weight: 317 lb (143.79 kg)     SpO2: 100%      Vitals reviewed. General: sitting in chair, NAD HEENT: PERRL, EOMI.  R ear pain illicited upon visualization, erythema of external canal, no visible drainage or pus.  Failed whisper test on right ear.  AC>BC b/l ears.  Did not lateralize to either ear with Webers.   Cardiac: RRR, no rubs, murmurs or gallops Pulm: clear to auscultation bilaterally, no wheezes, rales, or rhonchi Abd: soft, nontender, nondistended, BS present Ext: warm and well perfused, b/l pitting edema lower extremities. Neuro: alert and oriented X3, cranial nerves II-XII grossly intact, strength and sensation to light touch equal in bilateral upper and lower extremities.  Walks with cane.   Assessment & Plan:  Discussed with Dr. Dalphine Handing  R ear pain: ?otitis externa: will treat with ciprofloxacin x7 days and acetic acid hydrocortisone ear drops Hypotension: hold metolazone and follow up with cards soon, called Dr. Lubertha Basque office today who will follow up with patient.

## 2012-08-05 NOTE — Assessment & Plan Note (Addendum)
X 1 month, painful, getting worse, decreased hearing on right side, pain on exam.  No relief with supportive measures and home remedies including sweet oil and ear drops.   -ciprofloxacin 500mg  po bid x7 days given poor diabetic control, no improvement in symptoms for 1 month,  and associated cough and sputum production -vosol otic solution prn -reviewed alarm symptoms and need to call or return to clinic: worsening ear pain, swelling, drainage, hearing loss, fever, chills -if continues to have discomfort, will need to refer to ENT

## 2012-08-06 ENCOUNTER — Ambulatory Visit (INDEPENDENT_AMBULATORY_CARE_PROVIDER_SITE_OTHER): Payer: Medicaid Other | Admitting: Internal Medicine

## 2012-08-06 VITALS — BP 100/71 | HR 89 | Wt 316.0 lb

## 2012-08-06 DIAGNOSIS — I5022 Chronic systolic (congestive) heart failure: Secondary | ICD-10-CM

## 2012-08-06 LAB — CBC WITH DIFFERENTIAL/PLATELET
Basophils Relative: 0.6 % (ref 0.0–3.0)
Eosinophils Relative: 0 % (ref 0.0–5.0)
MCV: 88.9 fl (ref 78.0–100.0)
Monocytes Absolute: 0.6 10*3/uL (ref 0.1–1.0)
Neutrophils Relative %: 72.1 % (ref 43.0–77.0)
RBC: 3.05 Mil/uL — ABNORMAL LOW (ref 4.22–5.81)
WBC: 5.6 10*3/uL (ref 4.5–10.5)

## 2012-08-06 LAB — BASIC METABOLIC PANEL
Chloride: 101 mEq/L (ref 96–112)
Creatinine, Ser: 6.8 mg/dL (ref 0.4–1.5)
Potassium: 4.4 mEq/L (ref 3.5–5.1)
Sodium: 137 mEq/L (ref 135–145)

## 2012-08-06 NOTE — Patient Instructions (Addendum)
Your physician recommends that you have Lab work BMET,CBC,BNP Your physician recommends that you continue on your current medications as directed. Please refer to the Current Medication list given to you today. Your physician recommends that you schedule a follow-up appointment in: 6 Months

## 2012-08-06 NOTE — Progress Notes (Signed)
Case discussed with Dr. Qureshi at the time of the visit.  We reviewed the resident's history and exam and pertinent patient test results.  I agree with the assessment, diagnosis, and plan of care documented in the resident's note. 

## 2012-08-06 NOTE — Progress Notes (Signed)
HPI Patient is a 47 yo with a history of NICM (LVEF 30%), HTN, CRI  He was last seen in cardiology clnic in April The patient was in medicine clnic yesterday.  His SBP was 90/  He was told to hold his metalozone and be set up for appt in cardiology. The patient says he has not been feeling well  Has had R ear problems  Finally told yesterday he had an ear infection.  On ABX now. His breathing has been stable  No CP  No signif edema.   Allergies  Allergen Reactions  . Ace Inhibitors     Acute renal failure with multiple trials    Current Outpatient Prescriptions  Medication Sig Dispense Refill  . ACCU-CHEK COMPACT STRIPS test strip USE TO TEST BLOOD SUGAR UP TO 1 TIME A DAY  102 each  4  . ACCU-CHEK SOFTCLIX LANCETS lancets USE TO CHECK BLOOD SUGAR UP TO 1 TIME A DAY  100 each  3  . acetaminophen (TYLENOL) 325 MG tablet Take 650 mg by mouth every 6 (six) hours as needed. For pain      . B-D ULTRAFINE III SHORT PEN 31G X 8 MM MISC USE TO INJECT INSULIN AS DIRECTED  100 each  3  . ciprofloxacin (CIPRO) 500 MG tablet Take 1 tablet (500 mg total) by mouth 2 (two) times daily.  14 tablet  0  . colchicine 0.6 MG tablet Take 1 tablet (0.6 mg total) by mouth daily.  30 tablet  2  . diclofenac sodium (VOLTAREN) 1 % GEL       . docusate sodium (COLACE) 100 MG capsule Take 100 mg by mouth daily.       Marland Kitchen esomeprazole (NEXIUM) 20 MG capsule Take 1 capsule (20 mg total) by mouth daily before breakfast.  30 capsule  11  . febuxostat (ULORIC) 40 MG tablet Take 80 mg by mouth daily.      . ferrous sulfate 325 (65 FE) MG tablet Take 1 tablet (325 mg total) by mouth 2 (two) times daily with breakfast and lunch.  60 tablet  2  . fluticasone (FLONASE) 50 MCG/ACT nasal spray Place 1 spray into the nose daily as needed. For allergies      . insulin glargine (LANTUS SOLOSTAR) 100 UNIT/ML injection Inject 62 Units into the skin at bedtime.  30 mL  11  . loratadine (CLARITIN) 10 MG tablet TAKE 1 TABLET (10 MG  TOTAL) BY MOUTH DAILY.  30 tablet  3  . LORazepam (ATIVAN) 0.5 MG tablet TAKE 1 TABLET BY MOUTH TWICE A DAY AS NEEDED  60 tablet  1  . metoprolol (LOPRESSOR) 100 MG tablet TAKE 1 TABLET BY MOUTH TWICE A DAY  62 tablet  10  . simvastatin (ZOCOR) 40 MG tablet TAKE 1 TABLET BY MOUTH DAILY  30 tablet  11  . spironolactone (ALDACTONE) 25 MG tablet Take 1 tablet (25 mg total) by mouth daily.  31 tablet  11  . torsemide (DEMADEX) 100 MG tablet TAKE 1 TABLET BY MOUTH TWICE A DAY  60 tablet  1  . traMADol (ULTRAM) 50 MG tablet TAKE 1 TABLET BY MOUTH EVERY 6 HOURS AS NEEDED FOR PAIN  60 tablet  0   No current facility-administered medications for this visit.    Past Medical History  Diagnosis Date  . Other and unspecified hyperlipidemia   . Insomnia, unspecified   . Obstructive sleep apnea (adult) (pediatric)   . Allergic rhinitis, cause unspecified   .  Nonischemic cardiomyopathy   . Unspecified essential hypertension   . Type II or unspecified type diabetes mellitus without mention of complication, not stated as uncontrolled   . CKD (chronic kidney disease) stage 3, GFR 30-59 ml/min   . Esophageal reflux   . Morbid obesity   . Dysuria   . Tobacco use disorder   . Dysrhythmia   . Pacemaker   . Antral ulcer   . Renal azotemia   . Arthritis     Gout w/hyperuricemia  . Morbid obesity     Past Surgical History  Procedure Laterality Date  . Cardiac defibrillator placement  2011  . Esophagogastroduodenoscopy  01/03/2011    Procedure: ESOPHAGOGASTRODUODENOSCOPY (EGD);  Surgeon: Vertell Novak., MD;  [facility] ENDOSCOPY;  Service: Endoscopy;  Laterality: N/A;    Family History  Problem Relation Age of Onset  . Diabetes Mother   . Microcephaly Father   . Lung cancer Father     History   Social History  . Marital Status: Single    Spouse Name: N/A    Number of Children: N/A  . Years of Education: N/A   Occupational History  . Not on file.   Social History Main Topics  .  Smoking status: Current Every Day Smoker -- 0.50 packs/day    Types: Cigarettes  . Smokeless tobacco: Never Used     Comment: started smoking at age 19. Will stop on his own - cutting back now- was smoking a ppd - now only 3 a day  . Alcohol Use: No     Comment: No alcohol x 1 month.  . Drug Use: No  . Sexually Active: Not on file   Other Topics Concern  . Not on file   Social History Narrative  . No narrative on file    Review of Systems:  All systems reviewed.  They are negative to the above problem except as previously stated.  Vital Signs: BP 100/71  Pulse 89  Wt 316 lb (143.337 kg)  BMI 41.7 kg/m2  Physical Exam Patient is in NAD HEENT:  Normocephalic, atraumatic. EOMI, PERRLA.R ear:  TM is slightly cloudy  No bulging  Not erythematous  Neck: JVP is elevated.  No bruits.  Lungs: clear to auscultation. No rales no wheezes.  Heart: Regular rate and rhythm. Normal S1, S2. No S3.   No significant murmurs. PMI not displaced.  Abdomen:  Supple, nontender. Normal bowel sounds. No masses. No hepatomegaly.  Extremities:   Good distal pulses throughout. Tr lower extremity edema.  Musculoskeletal :moving all extremities.  Neuro:   alert and oriented x3.  CN II-XII grossly intact.   Assessment and Plan:  1.  HTN  BP is still on low side.  I am concerned about holding metalozone  WIll check labs and review  2.  CM  Volume status is up some   He is not uncomfortable  WIll need to check labs  3.  CRI  Check BMET

## 2012-08-09 ENCOUNTER — Other Ambulatory Visit: Payer: Self-pay

## 2012-08-09 ENCOUNTER — Other Ambulatory Visit (INDEPENDENT_AMBULATORY_CARE_PROVIDER_SITE_OTHER): Payer: Medicaid Other

## 2012-08-09 DIAGNOSIS — I5022 Chronic systolic (congestive) heart failure: Secondary | ICD-10-CM

## 2012-08-09 LAB — BASIC METABOLIC PANEL
BUN: 143 mg/dL (ref 6–23)
Calcium: 8.8 mg/dL (ref 8.4–10.5)
Creatinine, Ser: 6.5 mg/dL (ref 0.4–1.5)

## 2012-08-09 LAB — BRAIN NATRIURETIC PEPTIDE: Pro B Natriuretic peptide (BNP): 1018 pg/mL — ABNORMAL HIGH (ref 0.0–100.0)

## 2012-08-11 ENCOUNTER — Other Ambulatory Visit (INDEPENDENT_AMBULATORY_CARE_PROVIDER_SITE_OTHER): Payer: Medicaid Other

## 2012-08-11 DIAGNOSIS — I5022 Chronic systolic (congestive) heart failure: Secondary | ICD-10-CM

## 2012-08-11 LAB — BASIC METABOLIC PANEL
CO2: 19 mEq/L (ref 19–32)
Calcium: 8.6 mg/dL (ref 8.4–10.5)
Creatinine, Ser: 6.3 mg/dL (ref 0.4–1.5)
GFR: 12.24 mL/min — CL (ref >60.00)
Sodium: 136 mEq/L (ref 135–145)

## 2012-08-12 ENCOUNTER — Other Ambulatory Visit: Payer: Self-pay | Admitting: Internal Medicine

## 2012-08-12 NOTE — Telephone Encounter (Signed)
Please find out what patient is treating with the Voltaren Gel.

## 2012-08-13 NOTE — Telephone Encounter (Signed)
Called patient again and he has swelling in her legs and knees. Denies any shortness of breath, does not weigh daily. Patient states he can feel it building up in legs. Patients recent BUN 142, Cr 6.3 2 days ago. Advised patient to go to ED. Patient wanted to just take one of the medications that were recently stopped, advised against it. Explained to patient that his kidney functions were not good and taking this medication would not be good for them. Also tried to explain to patient that with the fluid building back up and his kidney function being what it was he was going to need very close monitoring and should go to ED for them to help him.

## 2012-08-13 NOTE — Telephone Encounter (Signed)
New Prob  Pt is having swelling in both legs. Wants to speak to a nurse.

## 2012-08-13 NOTE — Telephone Encounter (Signed)
Pt states he uses voltaren gel for knee pain

## 2012-08-13 NOTE — Telephone Encounter (Signed)
Left message to call back  

## 2012-08-17 ENCOUNTER — Telehealth: Payer: Self-pay | Admitting: Internal Medicine

## 2012-08-17 NOTE — Telephone Encounter (Signed)
Spoke with patient and he had appointment with Dr Lowell Guitar yesterday. He spoke with Dr Lowell Guitar today but did not tell him about the blisters, but kidney functions were improving. Advised patient to call Dr Roanna Banning office for his recommendations. Did call Dr Roanna Banning office and speak with Chyrl Civatte, Cr 4.05 08/16/12 and is getting a recheck on Thursday. She stated she would call patient with recommendation from Dr Lowell Guitar on diuretics.

## 2012-08-17 NOTE — Telephone Encounter (Signed)
New Prob  Pt states that his legs have fine bumps/blisters on them.

## 2012-08-19 ENCOUNTER — Encounter: Payer: Self-pay | Admitting: Internal Medicine

## 2012-08-19 ENCOUNTER — Ambulatory Visit: Payer: Medicaid Other | Admitting: Internal Medicine

## 2012-08-23 ENCOUNTER — Encounter: Payer: Self-pay | Admitting: *Deleted

## 2012-08-23 ENCOUNTER — Other Ambulatory Visit: Payer: Self-pay | Admitting: *Deleted

## 2012-08-24 ENCOUNTER — Telehealth: Payer: Self-pay | Admitting: Internal Medicine

## 2012-08-24 NOTE — Telephone Encounter (Signed)
Dr Tenny Craw aware of pt's symptoms and  She  will call pt today. I spoke with  [facility]  Triage person, she  will fax  the last pt's labs done in their office in a few minutes.

## 2012-08-24 NOTE — Telephone Encounter (Signed)
Pt's last labs received from [facility] Dr. Roanna Banning office. Labs results were placed in Dr. Tenny Craw office desk.

## 2012-08-24 NOTE — Telephone Encounter (Signed)
Pt called because he has been holding fluid, his LE are very swollen that make it Difficult to  walk, he is also  SOB. Pt is taking only Lasix 80 mg once a day; pt  thinks that one fluid pill is not enough to get read of the fluids in his body. Pt is scheduled to have his arm graft for dialysis this coming Tuesday 8/5.

## 2012-08-24 NOTE — Telephone Encounter (Signed)
New Prob  Pt states that he has fluid on his legs. He said it was getting better but now it has gotten swollen again. He said that he can barely walk, he said his legs feel very tight.

## 2012-08-25 NOTE — Pre-Procedure Instructions (Signed)
Adam [name]  08/25/2012   Your procedure is scheduled on:  August 31, 2012 at 7:30 AM  Report to [facility] at 5:30 AM.  Call this number if you have problems the morning of surgery: [phone]   Remember:   Do not eat food or drink liquids after midnight.   Take these medicines the morning of surgery with A SIP OF WATER: metoprolol (LOPRESSOR), LORazepam (ATIVAN), loratadine (CLARITIN), esomeprazole (NEXIUM)      Do not wear jewelry, make-up or nail polish.  Do not wear lotions, powders, or perfumes. You may wear deodorant.  Do not shave 48 hours prior to surgery. Men may shave face and neck.  Do not bring valuables to the hospital.  [facility] is not responsible                   for any belongings or valuables.  Contacts, dentures or bridgework may not be worn into surgery.  Leave suitcase in the car. After surgery it may be brought to your room.  For patients admitted to the hospital, checkout time is 11:00 AM the day of  discharge.   Patients discharged the day of surgery will not be allowed to drive  home.  Name and phone number of your driver: family/friend  Special Instructions: Shower using CHG 2 nights before surgery and the night before surgery.  If you shower the day of surgery use CHG.  Use special wash - you have one bottle of CHG for all showers.  You should use approximately 1/3 of the bottle for each shower.   Please read over the following fact sheets that you were given: Pain Booklet, Coughing and Deep Breathing and Surgical Site Infection Prevention

## 2012-08-25 NOTE — Telephone Encounter (Signed)
Spoke to patient.   Breathing is better today.  Received labs from [employer].  Cr was 3.8 on 7/25   Patient says a medicine was added.  Due to go back for labs tomorrow. I told patient Dr Lowell Guitar would be the one to make changes on diuretics.  If breathing gets short he should contact him.

## 2012-08-26 ENCOUNTER — Encounter (HOSPITAL_COMMUNITY): Payer: Self-pay | Admitting: Pharmacy Technician

## 2012-08-26 ENCOUNTER — Encounter (HOSPITAL_COMMUNITY): Payer: Self-pay

## 2012-08-26 ENCOUNTER — Encounter (HOSPITAL_COMMUNITY)
Admission: RE | Admit: 2012-08-26 | Discharge: 2012-08-26 | Disposition: A | Discharge status: Home / Self Care | Payer: Medicaid Other | Source: Ambulatory Visit | Attending: Vascular Surgery | Admitting: Vascular Surgery

## 2012-08-26 NOTE — Progress Notes (Signed)
Talked to Adam [name] rep for [employer] informed of time of surgery, orders from Dr Reece Agar. Adam [name]  concerning ICD. Stated he would be here at 0530.

## 2012-08-26 NOTE — Progress Notes (Signed)
Patient has an appointment for [facility] clinic today for labs and Defibrillator check 08/27/12 at 1030

## 2012-08-27 ENCOUNTER — Ambulatory Visit (INDEPENDENT_AMBULATORY_CARE_PROVIDER_SITE_OTHER): Payer: Medicaid Other | Admitting: Internal Medicine

## 2012-08-27 ENCOUNTER — Encounter: Payer: Self-pay | Admitting: Internal Medicine

## 2012-08-27 ENCOUNTER — Encounter: Payer: Self-pay | Admitting: Licensed Clinical Social Worker

## 2012-08-27 VITALS — BP 107/78 | HR 95 | Ht 73.0 in | Wt 346.6 lb

## 2012-08-27 DIAGNOSIS — I10 Essential (primary) hypertension: Secondary | ICD-10-CM

## 2012-08-27 DIAGNOSIS — I5022 Chronic systolic (congestive) heart failure: Secondary | ICD-10-CM

## 2012-08-27 DIAGNOSIS — Z95810 Presence of automatic (implantable) cardiac defibrillator: Secondary | ICD-10-CM

## 2012-08-27 DIAGNOSIS — I428 Other cardiomyopathies: Secondary | ICD-10-CM

## 2012-08-27 NOTE — Progress Notes (Signed)
Patient ID: Adam [name], male   DOB: 1965-02-22, 47 y.o.   MRN: [id number] CSW received request for PCS.  Initiated PCS request and forwarded to most recent physician visit provider.

## 2012-08-27 NOTE — Progress Notes (Signed)
Anesthesia Chart Review:  Patient is a 47 year old male scheduled for creation of a RUE AVF and insertion of a hemodialysis catheter on 08/31/12 by Dr. Edilia Bo.  History includes morbid obesity, smoking, OSA (Dr. Shelle Iron), DM2, CKD, HTN, HLD, non-ischemic cardiomyopathy s/p insertion of a single chamber defibrillator 09/08/08 ([employer]).  He receives primary care thru [facility] Res[facility] clinic. Nephrology is Dr. Lowell Guitar.  Cardiologists are Dr. Tenny Craw and Dr. Ladona Ridgel.  He saw Dr. Ladona Ridgel earlier today, but the note is still pending.       EKG on 08/26/12 showed SR with first degree AV block, low voltage QRS, cannot rule out anteroseptal infarct (age undetermined).  Overall, I think it is stable when compared to 05/20/12.  Echo on 01/20/12 showed: - Left ventricle: Tech. difficult study. The cavity size was moderately dilated. Wall thickness was increased in a pattern of mild LVH. The estimated ejection fraction was 25%. Diffuse hypokinesis. Findings consistent with left ventricular diastolic dysfunction. - Mitral valve: Mild regurgitation. - Left atrium: The atrium was moderately to severely dilated. - Right ventricle: The cavity size was moderately to severely dilated. Pacer wire or catheter noted in right ventricle. Systolic function was moderately to severely reduced. - Right atrium: The atrium was moderately dilated. - Tricuspid valve: Moderate regurgitation. - Pulmonary arteries: PA peak pressure: 46mm Hg (S).  Cardiac cath on 03/28/13 showed normal coronaries.  Medical therapy was initiated for non-ischemia cardiomyopathy.   CXR on 08/26/12 showed no active disease. Cardiomegaly again noted. Single lead cardiac pacemaker in place.  He is for an ISTAT on the day of surgery.  Patient has non-ischemic CM s/p ICD.  He is nearing need for hemodialysis.  He will be evaluated by his assigned anesthesiologist on the day of surgery.  If labs stable and no acute changes then I would anticipate that he could  proceed with this procedure.    Velna Ochs [facility]/Anesthesiology Phone [phone] 08/27/2012 5:10 PM

## 2012-08-27 NOTE — Patient Instructions (Addendum)
Your physician recommends that you schedule a follow-up appointment in: 3 months with device clinic  

## 2012-08-30 ENCOUNTER — Encounter: Payer: Self-pay | Admitting: Internal Medicine

## 2012-08-30 MED ORDER — DEXTROSE 5 % IV SOLN
1.5000 g | INTRAVENOUS | Status: AC
  Administered 2012-08-31: 1.5 g via INTRAVENOUS
  Filled 2012-08-30: qty 1.5

## 2012-08-30 NOTE — Progress Notes (Signed)
HPI Adam [name] returns today for followup. He is a very pleasant middle-age man with a nonischemic cardiomyopathy, chronic class III systolic heart failure, morbid obesity, near end-stage renal disease, approaching hemodialysis. The patient has been bothered by volume overload. Despite attempts to maintain a low-sodium diet and to take his medical therapy, he continues to have shortness of breath, and peripheral edema. No syncope. No recent ICD shocks. Allergies  Allergen Reactions  . Ace Inhibitors     Acute renal failure with multiple trials     Current Outpatient Prescriptions  Medication Sig Dispense Refill  . acetaminophen (TYLENOL) 325 MG tablet Take 650 mg by mouth every 6 (six) hours as needed for pain.      Marland Kitchen colchicine 0.6 MG tablet Take 0.6 mg by mouth See admin instructions. Takes every three days.      Marland Kitchen docusate sodium (COLACE) 100 MG capsule Take 100 mg by mouth as needed.       Marland Kitchen esomeprazole (NEXIUM) 20 MG capsule Take 1 capsule (20 mg total) by mouth daily before breakfast.  30 capsule  11  . Febuxostat (ULORIC) 80 MG TABS Take 1 tablet by mouth daily.      . ferrous sulfate 325 (65 FE) MG tablet Take 325 mg by mouth 2 (two) times daily.      . fluticasone (FLONASE) 50 MCG/ACT nasal spray Place 1 spray into the nose daily as needed for allergies. For allergies      . insulin glargine (LANTUS) 100 UNIT/ML injection Inject 62 Units into the skin at bedtime.      Marland Kitchen loratadine (CLARITIN) 10 MG tablet Take 10 mg by mouth daily.      Marland Kitchen LORazepam (ATIVAN) 0.5 MG tablet Take 0.5 mg by mouth 2 (two) times daily as needed for anxiety.      . metoprolol (LOPRESSOR) 100 MG tablet Take 100 mg by mouth 2 (two) times daily.      . simvastatin (ZOCOR) 40 MG tablet TAKE 1 TABLET BY MOUTH DAILY  30 tablet  11  . traMADol (ULTRAM-ER) 100 MG 24 hr tablet Take 100 mg by mouth every 6 (six) hours as needed for pain.      Marland Kitchen diclofenac sodium (VOLTAREN) 1 % GEL Apply topically. Apply topically 4  times a day as needed.       No current facility-administered medications for this visit.   Facility-Administered Medications Ordered in Other Visits  Medication Dose Route Frequency Provider Last Rate Last Dose  . cefUROXime (ZINACEF) 1.5 g in dextrose 5 % 50 mL IVPB  1.5 g Intravenous 30 min Pre-Op Chuck Hint, MD         Past Medical History  Diagnosis Date  . Other and unspecified hyperlipidemia   . Insomnia, unspecified   . Obstructive sleep apnea (adult) (pediatric)   . Allergic rhinitis, cause unspecified   . Nonischemic cardiomyopathy   . Unspecified essential hypertension   . Type II or unspecified type diabetes mellitus without mention of complication, not stated as uncontrolled   . CKD (chronic kidney disease) stage 3, GFR 30-59 ml/min   . Esophageal reflux   . Morbid obesity   . Dysuria   . Tobacco use disorder   . Dysrhythmia   . Pacemaker   . Antral ulcer   . Renal azotemia   . Arthritis     Gout w/hyperuricemia  . Morbid obesity     ROS:   All systems reviewed and negative except as noted in  the HPI.   Past Surgical History  Procedure Laterality Date  . Cardiac defibrillator placement  2011  . Esophagogastroduodenoscopy  01/03/2011    Procedure: ESOPHAGOGASTRODUODENOSCOPY (EGD);  Surgeon: Vertell Novak., MD;  Location: [facility] ENDOSCOPY;  Service: Endoscopy;  Laterality: N/A;     Family History  Problem Relation Age of Onset  . Diabetes Mother   . Microcephaly Father   . Lung cancer Father      History   Social History  . Marital Status: Single    Spouse Name: N/A    Number of Children: N/A  . Years of Education: N/A   Occupational History  . Not on file.   Social History Main Topics  . Smoking status: Current Every Day Smoker -- 0.50 packs/day for .5 years    Types: Cigarettes  . Smokeless tobacco: Never Used     Comment: started smoking at age 2. Will stop on his own - cutting back now- was smoking a ppd - now only 3 a  day  . Alcohol Use: No     Comment: No alcohol x 1 month.  . Drug Use: No  . Sexually Active: Not on file   Other Topics Concern  . Not on file   Social History Narrative  . No narrative on file     BP 107/78  Pulse 95  Ht 6\' 1"  (1.854 m)  Wt 346 lb 9.6 oz (157.217 kg)  BMI 45.74 kg/m2  Physical Exam:  Chronically ill appearing morbidly obese, middle-age man,NAD HEENT: Unremarkable Neck:  9 cm JVD, no thyromegally Lungs:  Clear except for decreased breath sounds in the bases bilaterally and scattered rales approximately 1/3 up. No increased work of breathing. HEART:  Regular rate rhythm, no murmurs, no rubs, no clicks, heart sounds are distant. Abd:  soft, positive bowel sounds, no organomegally, no rebound, no guarding Ext:  2 plus pulses, 3+ peripheral edema bilaterally, no cyanosis, no clubbing Skin:  No rashes no nodules Neuro:  CN II through XII intact, motor grossly intact  DEVICE  Normal device function.  See PaceArt for details.   Assess/Plan:

## 2012-08-30 NOTE — Assessment & Plan Note (Signed)
His [employer] single-chamber ICD is working normally. We'll plan to recheck in several months. 

## 2012-08-30 NOTE — Assessment & Plan Note (Signed)
The patient's heart failure symptoms are class III. He is bothered mostly by inability to remove fluid and chronic peripheral edema. He is trying to reduce his salt intake. I suspect that once he begins hemodialysis, his fluid volume problems will become much easier to handle.

## 2012-08-30 NOTE — Assessment & Plan Note (Signed)
His blood pressure remains well controlled. He will continue a low-sodium diet.

## 2012-08-31 ENCOUNTER — Encounter (HOSPITAL_COMMUNITY): Payer: Self-pay | Admitting: Vascular Surgery

## 2012-08-31 ENCOUNTER — Encounter (HOSPITAL_COMMUNITY): Payer: Self-pay | Admitting: Anesthesiology

## 2012-08-31 ENCOUNTER — Ambulatory Visit (HOSPITAL_COMMUNITY): Payer: Medicaid Other

## 2012-08-31 ENCOUNTER — Encounter (HOSPITAL_COMMUNITY)
Admission: RE | Disposition: A | Discharge status: Home / Self Care | Payer: Self-pay | Source: Ambulatory Visit | Attending: Vascular Surgery

## 2012-08-31 ENCOUNTER — Ambulatory Visit (HOSPITAL_COMMUNITY)
Admission: RE | Admit: 2012-08-31 | Discharge: 2012-08-31 | Disposition: A | Discharge status: Home / Self Care | Payer: Medicaid Other | Source: Ambulatory Visit | Attending: Vascular Surgery | Admitting: Vascular Surgery

## 2012-08-31 ENCOUNTER — Ambulatory Visit (HOSPITAL_COMMUNITY): Payer: Medicaid Other | Admitting: Anesthesiology

## 2012-08-31 DIAGNOSIS — Z95810 Presence of automatic (implantable) cardiac defibrillator: Secondary | ICD-10-CM | POA: Insufficient documentation

## 2012-08-31 DIAGNOSIS — Z794 Long term (current) use of insulin: Secondary | ICD-10-CM | POA: Insufficient documentation

## 2012-08-31 DIAGNOSIS — F411 Generalized anxiety disorder: Secondary | ICD-10-CM | POA: Insufficient documentation

## 2012-08-31 DIAGNOSIS — I499 Cardiac arrhythmia, unspecified: Secondary | ICD-10-CM | POA: Insufficient documentation

## 2012-08-31 DIAGNOSIS — F17200 Nicotine dependence, unspecified, uncomplicated: Secondary | ICD-10-CM | POA: Insufficient documentation

## 2012-08-31 DIAGNOSIS — K219 Gastro-esophageal reflux disease without esophagitis: Secondary | ICD-10-CM | POA: Insufficient documentation

## 2012-08-31 DIAGNOSIS — E119 Type 2 diabetes mellitus without complications: Secondary | ICD-10-CM | POA: Insufficient documentation

## 2012-08-31 DIAGNOSIS — Z6841 Body Mass Index (BMI) 40.0 and over, adult: Secondary | ICD-10-CM | POA: Insufficient documentation

## 2012-08-31 DIAGNOSIS — J309 Allergic rhinitis, unspecified: Secondary | ICD-10-CM | POA: Insufficient documentation

## 2012-08-31 DIAGNOSIS — N186 End stage renal disease: Secondary | ICD-10-CM | POA: Insufficient documentation

## 2012-08-31 DIAGNOSIS — Z79899 Other long term (current) drug therapy: Secondary | ICD-10-CM | POA: Insufficient documentation

## 2012-08-31 DIAGNOSIS — I509 Heart failure, unspecified: Secondary | ICD-10-CM | POA: Insufficient documentation

## 2012-08-31 DIAGNOSIS — G4733 Obstructive sleep apnea (adult) (pediatric): Secondary | ICD-10-CM | POA: Insufficient documentation

## 2012-08-31 DIAGNOSIS — I428 Other cardiomyopathies: Secondary | ICD-10-CM | POA: Insufficient documentation

## 2012-08-31 DIAGNOSIS — I120 Hypertensive chronic kidney disease with stage 5 chronic kidney disease or end stage renal disease: Secondary | ICD-10-CM | POA: Insufficient documentation

## 2012-08-31 DIAGNOSIS — G4700 Insomnia, unspecified: Secondary | ICD-10-CM | POA: Insufficient documentation

## 2012-08-31 DIAGNOSIS — J45909 Unspecified asthma, uncomplicated: Secondary | ICD-10-CM | POA: Insufficient documentation

## 2012-08-31 DIAGNOSIS — N185 Chronic kidney disease, stage 5: Secondary | ICD-10-CM

## 2012-08-31 DIAGNOSIS — N184 Chronic kidney disease, stage 4 (severe): Secondary | ICD-10-CM

## 2012-08-31 DIAGNOSIS — E785 Hyperlipidemia, unspecified: Secondary | ICD-10-CM | POA: Insufficient documentation

## 2012-08-31 HISTORY — PX: INSERTION OF DIALYSIS CATHETER: SHX1324

## 2012-08-31 HISTORY — PX: AV FISTULA PLACEMENT: SHX1204

## 2012-08-31 LAB — ICD DEVICE OBSERVATION
DEV-0020ICD: NEGATIVE
HV IMPEDENCE: 37 Ohm
RV LEAD IMPEDENCE ICD: 375 Ohm
RV LEAD THRESHOLD: 0.5 V
TOT-0009: 0
TOT-0010: 13

## 2012-08-31 LAB — POCT I-STAT 4, (NA,K, GLUC, HGB,HCT)
Glucose, Bld: 178 mg/dL — ABNORMAL HIGH (ref 70–99)
HCT: 31 % — ABNORMAL LOW (ref 39.0–52.0)
Sodium: 145 mEq/L (ref 135–145)

## 2012-08-31 SURGERY — ARTERIOVENOUS (AV) FISTULA CREATION
Anesthesia: Monitor Anesthesia Care | Site: Arm Lower | Laterality: Right | Wound class: Clean

## 2012-08-31 MED ORDER — LIDOCAINE HCL (CARDIAC) 20 MG/ML IV SOLN
INTRAVENOUS | Status: DC | PRN
  Administered 2012-08-31 (×2): 20 mg via INTRAVENOUS

## 2012-08-31 MED ORDER — PROTAMINE SULFATE 10 MG/ML IV SOLN
INTRAVENOUS | Status: DC | PRN
  Administered 2012-08-31 (×3): 10 mg via INTRAVENOUS

## 2012-08-31 MED ORDER — LIDOCAINE HCL (PF) 1 % IJ SOLN
INTRAMUSCULAR | Status: AC
  Filled 2012-08-31: qty 30

## 2012-08-31 MED ORDER — PROPOFOL 10 MG/ML IV BOLUS
INTRAVENOUS | Status: DC | PRN
  Administered 2012-08-31: 10 mg via INTRAVENOUS
  Administered 2012-08-31 (×2): 20 mg via INTRAVENOUS
  Administered 2012-08-31 (×4): 10 mg via INTRAVENOUS

## 2012-08-31 MED ORDER — HEPARIN SODIUM (PORCINE) 1000 UNIT/ML IJ SOLN
INTRAMUSCULAR | Status: DC | PRN
  Administered 2012-08-31: 8000 [IU] via INTRAVENOUS

## 2012-08-31 MED ORDER — LIDOCAINE-EPINEPHRINE (PF) 1 %-1:200000 IJ SOLN
INTRAMUSCULAR | Status: DC | PRN
  Administered 2012-08-31: 30 mL

## 2012-08-31 MED ORDER — MIDAZOLAM HCL 5 MG/5ML IJ SOLN
INTRAMUSCULAR | Status: DC | PRN
  Administered 2012-08-31: 2 mg via INTRAVENOUS

## 2012-08-31 MED ORDER — 0.9 % SODIUM CHLORIDE (POUR BTL) OPTIME
TOPICAL | Status: DC | PRN
  Administered 2012-08-31: 1000 mL

## 2012-08-31 MED ORDER — OXYCODONE-ACETAMINOPHEN 5-325 MG PO TABS: 1.0000 | ORAL_TABLET | ORAL | Status: DC | PRN

## 2012-08-31 MED ORDER — THROMBIN 20000 UNITS EX KIT
PACK | CUTANEOUS | Status: AC
  Filled 2012-08-31: qty 1

## 2012-08-31 MED ORDER — THROMBIN 20000 UNITS EX SOLR
CUTANEOUS | Status: AC
  Filled 2012-08-31: qty 20000

## 2012-08-31 MED ORDER — ONDANSETRON HCL 4 MG/2ML IJ SOLN
INTRAMUSCULAR | Status: DC | PRN
  Administered 2012-08-31: 4 mg via INTRAVENOUS

## 2012-08-31 MED ORDER — HEPARIN SODIUM (PORCINE) 1000 UNIT/ML IJ SOLN
INTRAMUSCULAR | Status: AC
  Filled 2012-08-31: qty 1

## 2012-08-31 MED ORDER — HYDROMORPHONE HCL PF 1 MG/ML IJ SOLN: 0.2500 mg | INTRAMUSCULAR | Status: DC | PRN

## 2012-08-31 MED ORDER — LIDOCAINE-EPINEPHRINE (PF) 1 %-1:200000 IJ SOLN
INTRAMUSCULAR | Status: AC
  Filled 2012-08-31: qty 10

## 2012-08-31 MED ORDER — LIDOCAINE HCL (PF) 1 % IJ SOLN
INTRAMUSCULAR | Status: DC | PRN
  Administered 2012-08-31: 30 mL

## 2012-08-31 MED ORDER — SODIUM CHLORIDE 0.9 % IV SOLN
INTRAVENOUS | Status: DC
  Administered 2012-08-31: 07:00:00 via INTRAVENOUS

## 2012-08-31 MED ORDER — HEPARIN SODIUM (PORCINE) 5000 UNIT/ML IJ SOLN
INTRAMUSCULAR | Status: DC | PRN
  Administered 2012-08-31: 07:00:00

## 2012-08-31 MED ORDER — HEPARIN SODIUM (PORCINE) 1000 UNIT/ML IJ SOLN
INTRAMUSCULAR | Status: DC | PRN
  Administered 2012-08-31: 1000 [IU]

## 2012-08-31 SURGICAL SUPPLY — 62 items
ARMBAND PINK RESTRICT EXTREMIT (MISCELLANEOUS) ×3 IMPLANT
BAG DECANTER FOR FLEXI CONT (MISCELLANEOUS) IMPLANT
CANISTER SUCTION 2500CC (MISCELLANEOUS) ×3 IMPLANT
CATH CANNON HEMO 15F 50CM (CATHETERS) IMPLANT
CATH CANNON HEMO 15FR 19 (HEMODIALYSIS SUPPLIES) IMPLANT
CATH CANNON HEMO 15FR 23CM (HEMODIALYSIS SUPPLIES) ×3 IMPLANT
CATH CANNON HEMO 15FR 31CM (HEMODIALYSIS SUPPLIES) IMPLANT
CATH CANNON HEMO 15FR 32CM (HEMODIALYSIS SUPPLIES) IMPLANT
CHLORAPREP W/TINT 26ML (MISCELLANEOUS) ×3 IMPLANT
CLIP TI MEDIUM 6 (CLIP) ×3 IMPLANT
CLIP TI WIDE RED SMALL 6 (CLIP) ×6 IMPLANT
CLOTH BEACON ORANGE TIMEOUT ST (SAFETY) ×3 IMPLANT
COVER PROBE W GEL 5X96 (DRAPES) ×3 IMPLANT
COVER SURGICAL LIGHT HANDLE (MISCELLANEOUS) ×3 IMPLANT
DECANTER SPIKE VIAL GLASS SM (MISCELLANEOUS) IMPLANT
DERMABOND ADVANCED (GAUZE/BANDAGES/DRESSINGS) ×2
DERMABOND ADVANCED .7 DNX12 (GAUZE/BANDAGES/DRESSINGS) ×4 IMPLANT
DRAIN PENROSE 1/2X12 LTX STRL (WOUND CARE) IMPLANT
DRAPE C-ARM 42X72 X-RAY (DRAPES) ×3 IMPLANT
DRAPE CHEST BREAST 15X10 FENES (DRAPES) ×3 IMPLANT
ELECT REM PT RETURN 9FT ADLT (ELECTROSURGICAL) ×3
ELECTRODE REM PT RTRN 9FT ADLT (ELECTROSURGICAL) ×2 IMPLANT
GAUZE SPONGE 2X2 8PLY STRL LF (GAUZE/BANDAGES/DRESSINGS) ×2 IMPLANT
GAUZE SPONGE 4X4 16PLY XRAY LF (GAUZE/BANDAGES/DRESSINGS) ×3 IMPLANT
GLOVE BIO SURGEON STRL SZ 6.5 (GLOVE) ×6 IMPLANT
GLOVE BIO SURGEON STRL SZ7.5 (GLOVE) ×6 IMPLANT
GLOVE BIOGEL PI IND STRL 6.5 (GLOVE) ×2 IMPLANT
GLOVE BIOGEL PI IND STRL 7.0 (GLOVE) ×2 IMPLANT
GLOVE BIOGEL PI IND STRL 8 (GLOVE) ×4 IMPLANT
GLOVE BIOGEL PI INDICATOR 6.5 (GLOVE) ×1
GLOVE BIOGEL PI INDICATOR 7.0 (GLOVE) ×1
GLOVE BIOGEL PI INDICATOR 8 (GLOVE) ×2
GLOVE SURG SS PI 7.0 STRL IVOR (GLOVE) ×6 IMPLANT
GOWN STRL NON-REIN LRG LVL3 (GOWN DISPOSABLE) IMPLANT
GUIDEWIRE ANGLED .035X150CM (WIRE) ×3 IMPLANT
KIT BASIN OR (CUSTOM PROCEDURE TRAY) ×3 IMPLANT
KIT ROOM TURNOVER OR (KITS) ×3 IMPLANT
NEEDLE 18GX1X1/2 (RX/OR ONLY) (NEEDLE) ×3 IMPLANT
NEEDLE 22X1 1/2 (OR ONLY) (NEEDLE) ×3 IMPLANT
NEEDLE HYPO 25GX1X1/2 BEV (NEEDLE) ×6 IMPLANT
NS IRRIG 1000ML POUR BTL (IV SOLUTION) ×3 IMPLANT
PACK CV ACCESS (CUSTOM PROCEDURE TRAY) ×3 IMPLANT
PACK SURGICAL SETUP 50X90 (CUSTOM PROCEDURE TRAY) IMPLANT
PAD ARMBOARD 7.5X6 YLW CONV (MISCELLANEOUS) ×6 IMPLANT
SPONGE GAUZE 2X2 STER 10/PKG (GAUZE/BANDAGES/DRESSINGS) ×1
SPONGE GAUZE 4X4 12PLY (GAUZE/BANDAGES/DRESSINGS) IMPLANT
SPONGE SURGIFOAM ABS GEL 100 (HEMOSTASIS) IMPLANT
SUT ETHILON 3 0 PS 1 (SUTURE) ×3 IMPLANT
SUT PROLENE 6 0 BV (SUTURE) ×6 IMPLANT
SUT VIC AB 3-0 SH 27 (SUTURE) ×1
SUT VIC AB 3-0 SH 27X BRD (SUTURE) ×2 IMPLANT
SUT VICRYL 4-0 PS2 18IN ABS (SUTURE) ×3 IMPLANT
SYR 20CC LL (SYRINGE) ×6 IMPLANT
SYR 30ML LL (SYRINGE) IMPLANT
SYR 5ML LL (SYRINGE) ×6 IMPLANT
SYR CONTROL 10ML LL (SYRINGE) ×6 IMPLANT
SYRINGE 10CC LL (SYRINGE) ×3 IMPLANT
TAPE CLOTH SURG 4X10 WHT LF (GAUZE/BANDAGES/DRESSINGS) ×3 IMPLANT
TOWEL OR 17X24 6PK STRL BLUE (TOWEL DISPOSABLE) ×6 IMPLANT
TOWEL OR 17X26 10 PK STRL BLUE (TOWEL DISPOSABLE) ×3 IMPLANT
UNDERPAD 30X30 INCONTINENT (UNDERPADS AND DIAPERS) ×3 IMPLANT
WATER STERILE IRR 1000ML POUR (IV SOLUTION) ×3 IMPLANT

## 2012-08-31 NOTE — Anesthesia Preprocedure Evaluation (Addendum)
Anesthesia Evaluation  Patient identified by MRN, date of birth, ID band Patient awake    Reviewed: Allergy & Precautions, H&P , NPO status , Patient's Chart, lab work & pertinent test results, reviewed documented beta blocker date and time   History of Anesthesia Complications Negative for: history of anesthetic complications  Airway Mallampati: II      Dental  (+) Teeth Intact and Dental Advisory Given   Pulmonary shortness of breath, with exertion and at rest, asthma , sleep apnea and Continuous Positive Airway Pressure Ventilation , Current Smoker,          Cardiovascular hypertension, Pt. on medications and Pt. on home beta blockers +CHF + dysrhythmias + pacemaker + Cardiac Defibrillator     Neuro/Psych PSYCHIATRIC DISORDERS Anxiety negative neurological ROS     GI/Hepatic Neg liver ROS, GERD-  Medicated and Controlled,  Endo/Other  diabetes, Type 2, Insulin DependentMorbid obesity  Renal/GU Renal disease     Musculoskeletal   Abdominal   Peds  Hematology negative hematology ROS (+)   Anesthesia Other Findings   Reproductive/Obstetrics negative OB ROS                          Anesthesia Physical Anesthesia Plan  ASA: III  Anesthesia Plan:    Post-op Pain Management:    Induction:   Airway Management Planned: Simple Face Mask  Additional Equipment:   Intra-op Plan:   Post-operative Plan:   Informed Consent:   Dental advisory given  Plan Discussed with: CRNA, Anesthesiologist and Surgeon  Anesthesia Plan Comments:         Anesthesia Quick Evaluation

## 2012-08-31 NOTE — H&P (Signed)
[facility] [place]   Patient name: Adam [name] MRN: [id number] DOB: [birth year] Sex: male   HPI:  Adam [name] is a 47 y.o. male who is not yet on dialysis. He is right-handed. He was referred for evaluation for hemodialysis access. Of note he has an AICD on the left side. He denies any recent uremic symptoms. Specifically he denies nausea, vomiting, fatigue, anorexia, or palpitations.  I have reviewed his records from Dr. Roanna Banning office. He has stage IV chronic kidney disease with significant as the tenia. In addition he has cardiomyopathy.   Past Medical History   Diagnosis  Date   .  Other and unspecified hyperlipidemia    .  Insomnia, unspecified    .  Obstructive sleep apnea (adult) (pediatric)    .  Allergic rhinitis, cause unspecified    .  Nonischemic cardiomyopathy    .  Unspecified essential hypertension    .  Type II or unspecified type diabetes mellitus without mention of complication, not stated as uncontrolled    .  CKD (chronic kidney disease) stage 3, GFR 30-59 ml/min    .  Esophageal reflux    .  Morbid obesity    .  Dysuria    .  Tobacco use disorder    .  Dysrhythmia    .  Pacemaker    .  Antral ulcer    .  Renal azotemia    .  Arthritis      Gout w/hyperuricemia   .  Morbid obesity     Family History   Problem  Relation  Age of Onset   .  Diabetes  Mother    .  Microcephaly  Father    .  Lung cancer  Father    SOCIAL HISTORY:  History   Substance Use Topics   .  Smoking status:  Current Every Day Smoker -- 0.30 packs/day     Types:  Cigarettes   .  Smokeless tobacco:  Never Used      Comment: started smoking at age 67. Will stop on his own - cutting back now- was smoking a ppd - now only 3 a day   .  Alcohol Use:  No      Comment: No alcohol x 1 month.   No Known Allergies  Current Outpatient Prescriptions   Medication  Sig  Dispense  Refill   .  ACCU-CHEK COMPACT STRIPS test strip  USE TO TEST BLOOD SUGAR UP TO 1 TIME A DAY  102  each  4   .  ACCU-CHEK SOFTCLIX LANCETS lancets  USE TO CHECK BLOOD SUGAR UP TO 1 TIME A DAY  100 each  3   .  acetaminophen (TYLENOL) 325 MG tablet  Take 650 mg by mouth every 6 (six) hours as needed. For pain     .  B-D ULTRAFINE III SHORT PEN 31G X 8 MM MISC  USE TO INJECT INSULIN AS DIRECTED  100 each  3   .  colchicine 0.6 MG tablet  Take 1 tablet (0.6 mg total) by mouth daily.  30 tablet  2   .  docusate sodium (COLACE) 100 MG capsule  Take 100 mg by mouth daily.     Marland Kitchen  esomeprazole (NEXIUM) 20 MG capsule  Take 1 capsule (20 mg total) by mouth daily before breakfast.  30 capsule  11   .  ferrous sulfate 325 (65 FE) MG tablet  Take 1 tablet (325 mg  total) by mouth 2 (two) times daily with breakfast and lunch.  60 tablet  2   .  fluticasone (FLONASE) 50 MCG/ACT nasal spray  Place 1 spray into the nose daily as needed. For allergies     .  insulin glargine (LANTUS SOLOSTAR) 100 UNIT/ML injection  Inject 62 Units into the skin at bedtime.  30 mL  11   .  loratadine (CLARITIN) 10 MG tablet  TAKE 1 TABLET (10 MG TOTAL) BY MOUTH DAILY.  30 tablet  3   .  LORazepam (ATIVAN) 0.5 MG tablet  TAKE 1 TABLET BY MOUTH TWICE A DAY AS NEEDED  60 tablet  1   .  metolazone (ZAROXOLYN) 10 MG tablet  Take 1 tablet (10 mg total) by mouth daily.  30 tablet  5   .  metoprolol (LOPRESSOR) 100 MG tablet  TAKE 1 TABLET BY MOUTH TWICE A DAY  62 tablet  10   .  simvastatin (ZOCOR) 40 MG tablet  TAKE 1 TABLET BY MOUTH DAILY  30 tablet  11   .  spironolactone (ALDACTONE) 25 MG tablet  Take 1 tablet (25 mg total) by mouth daily.  31 tablet  11   .  torsemide (DEMADEX) 100 MG tablet  Take 1 tablet (100 mg total) by mouth 2 (two) times daily.  60 tablet  5   .  traMADol (ULTRAM) 50 MG tablet  TAKE 1 TABLET BY MOUTH EVERY 6 HOURS AS NEEDED FOR PAIN  60 tablet  0   .  VOLTAREN 1 % GEL  APPLY 1 APPLICATION TOPICALLY 4 TIMES A DAY  100 g  1   .  allopurinol (ZYLOPRIM) 100 MG tablet  Take 1 tablet (100 mg total) by mouth daily.   30 tablet  5   .  predniSONE (STERAPRED UNI-PAK) 10 MG tablet  Take 1 tablet (10 mg total) by mouth daily. Take 40 mg today, take 30 mg tomorrow ( on 2/5), Take 20 mg on 2/6 , Take 10 mg 2/7 and 2/8.  11 tablet  0   .  traMADol (ULTRAM) 50 MG tablet  Take 2 tablets (100 mg total) by mouth every 6 (six) hours as needed for pain. Do not exceed 8 pills a day (400mg ).  120 tablet  1    No current facility-administered medications for this visit.    REVIEW OF SYSTEMS: Arly.Keller ] denotes positive finding; [ ]  denotes negative finding  CARDIOVASCULAR: [ ]  chest pain [ ]  chest pressure [ ]  palpitations [ ]  orthopnea  [ ]  dyspnea on exertion [ ]  claudication [ ]  rest pain [ ]  DVT [ ]  phlebitis  PULMONARY: [ ]  productive cough [ ]  asthma [ ]  wheezing  NEUROLOGIC: [ ]  weakness [ ]  paresthesias [ ]  aphasia [ ]  amaurosis [ ]  dizziness  HEMATOLOGIC: [ ]  bleeding problems [ ]  clotting disorders  MUSCULOSKELETAL: [ ]  joint pain [ ]  joint swelling [ ]  leg swelling  GASTROINTESTINAL: [ ]  blood in stool [ ]  hematemesis  GENITOURINARY: [ ]  dysuria [ ]  hematuria  PSYCHIATRIC: [ ]  history of major depression  INTEGUMENTARY: [ ]  rashes [ ]  ulcers  CONSTITUTIONAL: [ ]  fever [ ]  chills   PHYSICAL EXAM:  Filed Vitals:    06/09/12 1017   BP:  102/72   Pulse:  98   Resp:  18   Height:  6\' 1"  (1.854 m)   Weight:  310 lb (140.615 kg)   SpO2:  98%  Body mass index is 40.91 kg/(m^2).  GENERAL: The patient is a well-nourished male, in no acute distress. The vital signs are documented above.  CARDIOVASCULAR: There is a regular rate and rhythm. I do not detect carotid bruits. He has palpable radial pulses bilaterally.  PULMONARY: There is good air exchange bilaterally without wheezing or rales.  ABDOMEN: Soft and non-tender with normal pitched bowel sounds.  MUSCULOSKELETAL: There are no major deformities or cyanosis.  NEUROLOGIC: No focal weakness or paresthesias are detected.  SKIN: There are no ulcers or rashes noted.   PSYCHIATRIC: The patient has a normal affect.   DATA:  I've independently interpreted his vein mapping which shows he has a reasonable forearm, upper arm cephalic vein on the right. He also has a reasonable basilic vein on the right.   MEDICAL ISSUES:  End stage renal disease  Based on the patient's vein map he appears to be a reasonable candidate for a right upper extremity AV fistula. He has an AICD in the left arm and is not a candidate for a fistula on the left. I have explained the indications for placement of an AV fistula or AV graft. I've explained that if at all possible we will place an AV fistula. I have reviewed the risks of placement of an AV fistula including but not limited to: failure of the fistula to mature, need for subsequent interventions, and thrombosis. In addition I have reviewed the potential complications of placement of an AV graft. These risks include, but are not limited to, graft thrombosis, graft infection, wound healing problems, bleeding, arm swelling, and steal syndrome. All the patient's questions were answered. Currently, he is not ready to schedule surgery and would like to discuss this further with Dr. Lowell Guitar. Once he is agreeable to proceed we can schedule him for a right AV fistula.   Ranard Harte S  [facility] [place]  Beeper: [phone]

## 2012-08-31 NOTE — Anesthesia Postprocedure Evaluation (Signed)
  Anesthesia Post-op Note  Patient: Adam [name]  Procedure(s) Performed: Procedure(s): ARTERIOVENOUS (AV) FISTULA CREATION (Right) INSERTION OF DIALYSIS CATHETER-Right Internal Jugular Placement (Right)  Patient Location: PACU  Anesthesia Type:MAC  Level of Consciousness: awake  Airway and Oxygen Therapy: Patient Spontanous Breathing  Post-op Pain: mild  Post-op Assessment: Post-op Vital signs reviewed  Post-op Vital Signs: Reviewed and stable  Complications: No apparent anesthesia complications

## 2012-08-31 NOTE — Op Note (Signed)
NAME: Adam [name]    MRN: [id number] DOB: 04-05-1965    DATE OF OPERATION: 08/31/2012  PREOP DIAGNOSIS: stage V chronic kidney disease  POSTOP DIAGNOSIS: same  PROCEDURE:  1. Ultrasound-guided placement of a right IJ Diatek cath 2. Right radial cephalic AV fistula  SURGEON: Di Kindle. Edilia Bo, MD, FACS  ASSIST: Doreatha Massed, PA   ANESTHESIA: local with sedation   EBL: minimal  INDICATIONS: Adam [name] is a 47 y.o. male [name] to begin dialysis. He presents for new access.  FINDINGS: 4.5 mm cephalic vein  TECHNIQUE:  The patient was brought to the operating room and sedated by anesthesia. The neck and upper chest were prepped and draped in the usual sterile fashion. With the patient in Trendelenburg, using the ultrasound scanner, the right IJ was identified and was patent. The skin was anesthetized with 1% lidocaine and under direct ultrasound guidance the right IJ was cannulated and a guidewire introduced into the superior vena cava under fluoroscopic control. The tract over the wire was dilated and then the dilator and peel-away sheath were passed over the wire and the wire and dilator removed. The 23 cm catheter was passed through the peel-away sheath and positioned in the right atrium. The peel-away sheath was removed. The exit site the catheter was selected and the skin anesthetized between the 2 areas. The cath was brought through the tunnel cut to the appropriate length and the distal ports were attached. Both ports withdrew easily we then flushed with heparinized saline and filled with concentrated heparin. The catheter was secured at its exit site with a 3-0 nylon suture. The IJ cannulation site was closed with a 4-0 subcuticular stitch. Sterile dressing was applied.  Next attention was turned to placement of a right radial cephalic AV fistula. The right upper extremity was prepped and draped in usual sterile fashion. After the skin was anesthetized with 1% lidocaine, an incision  was made at the wrist and through this incision the cephalic vein was dissected free. Branches were divided between clips and 3-0 silk ties. The vein was ligated distally and irrigated nicely with heparinized saline it was a 4.5 mm vein. The radial artery was dissected free beneath the fascia. The patient was heparinized. The radial artery was clamped proximally and distally a longitudinal arteriotomy was made. The vein was spatulated and sewn end to side to the artery using continuous 6-0 Prolene suture. At the completion was an excellent thrill in the fistula and a good radial signal with the Doppler. The heparin was partially reversed with protamine. The wounds closed interrupted 3-0 Vicryl suture then the skin closed with 4-0 subcuticular stitch. Dermabond was applied. The patient tolerated the procedure well and was transferred to the recovery room in stable condition. All needle and sponge counts were correct.  Waverly Ferrari, MD, FACS [facility]  DATE OF DICTATION:   08/31/2012

## 2012-08-31 NOTE — Progress Notes (Signed)
DR.TAYLOR CALLED--DEFIBRILLATOR DOES NOT NEED TO BE INTERROGATED PRIOR TO PT GOING HOME.

## 2012-08-31 NOTE — Anesthesia Procedure Notes (Signed)
Procedure Name: MAC Date/Time: 08/31/2012 7:35 AM Performed by: Lovie Chol Pre-anesthesia Checklist: Patient identified, Emergency Drugs available, Suction available, Patient being monitored and Timeout performed Patient Re-evaluated:Patient Re-evaluated prior to inductionOxygen Delivery Method: Simple face mask

## 2012-08-31 NOTE — Preoperative (Signed)
Beta Blockers   Reason not to administer Beta Blockers:Not Applicable 

## 2012-08-31 NOTE — OR Nursing (Addendum)
Dialysis Catheter Placement end @0812 .

## 2012-08-31 NOTE — Transfer of Care (Signed)
Immediate Anesthesia Transfer of Care Note  Patient: Adam [name]  Procedure(s) Performed: Procedure(s): ARTERIOVENOUS (AV) FISTULA CREATION (Right) INSERTION OF DIALYSIS CATHETER-Right Internal Jugular Placement (Right)  Patient Location: PACU  Anesthesia Type:MAC  Level of Consciousness: awake, alert , oriented and patient cooperative  Airway & Oxygen Therapy: Patient Spontanous Breathing  Post-op Assessment: Report given to PACU RN and Post -op Vital signs reviewed and stable  Post vital signs: Reviewed and stable  Complications: No apparent anesthesia complications

## 2012-09-02 ENCOUNTER — Encounter (HOSPITAL_COMMUNITY): Payer: Self-pay | Admitting: Vascular Surgery

## 2012-09-03 ENCOUNTER — Encounter (HOSPITAL_COMMUNITY): Payer: Self-pay | Admitting: Nurse Practitioner

## 2012-09-03 ENCOUNTER — Telehealth: Payer: Self-pay | Admitting: *Deleted

## 2012-09-03 ENCOUNTER — Emergency Department (HOSPITAL_COMMUNITY)
Admission: EM | Admit: 2012-09-03 | Discharge: 2012-09-03 | Disposition: A | Discharge status: Home / Self Care | Payer: Medicaid Other | Attending: Emergency Medicine | Admitting: Emergency Medicine

## 2012-09-03 ENCOUNTER — Telehealth: Payer: Self-pay | Admitting: Internal Medicine

## 2012-09-03 DIAGNOSIS — E119 Type 2 diabetes mellitus without complications: Secondary | ICD-10-CM | POA: Insufficient documentation

## 2012-09-03 DIAGNOSIS — IMO0002 Reserved for concepts with insufficient information to code with codable children: Secondary | ICD-10-CM | POA: Insufficient documentation

## 2012-09-03 DIAGNOSIS — Z950 Presence of cardiac pacemaker: Secondary | ICD-10-CM | POA: Insufficient documentation

## 2012-09-03 DIAGNOSIS — K219 Gastro-esophageal reflux disease without esophagitis: Secondary | ICD-10-CM | POA: Insufficient documentation

## 2012-09-03 DIAGNOSIS — Z8719 Personal history of other diseases of the digestive system: Secondary | ICD-10-CM | POA: Insufficient documentation

## 2012-09-03 DIAGNOSIS — N183 Chronic kidney disease, stage 3 unspecified: Secondary | ICD-10-CM | POA: Insufficient documentation

## 2012-09-03 DIAGNOSIS — Z79899 Other long term (current) drug therapy: Secondary | ICD-10-CM | POA: Insufficient documentation

## 2012-09-03 DIAGNOSIS — Z794 Long term (current) use of insulin: Secondary | ICD-10-CM | POA: Insufficient documentation

## 2012-09-03 DIAGNOSIS — Z8669 Personal history of other diseases of the nervous system and sense organs: Secondary | ICD-10-CM | POA: Insufficient documentation

## 2012-09-03 DIAGNOSIS — I129 Hypertensive chronic kidney disease with stage 1 through stage 4 chronic kidney disease, or unspecified chronic kidney disease: Secondary | ICD-10-CM | POA: Insufficient documentation

## 2012-09-03 DIAGNOSIS — M129 Arthropathy, unspecified: Secondary | ICD-10-CM | POA: Insufficient documentation

## 2012-09-03 DIAGNOSIS — E785 Hyperlipidemia, unspecified: Secondary | ICD-10-CM | POA: Insufficient documentation

## 2012-09-03 DIAGNOSIS — Z791 Long term (current) use of non-steroidal anti-inflammatories (NSAID): Secondary | ICD-10-CM | POA: Insufficient documentation

## 2012-09-03 DIAGNOSIS — R600 Localized edema: Secondary | ICD-10-CM

## 2012-09-03 DIAGNOSIS — Z8679 Personal history of other diseases of the circulatory system: Secondary | ICD-10-CM | POA: Insufficient documentation

## 2012-09-03 DIAGNOSIS — F17200 Nicotine dependence, unspecified, uncomplicated: Secondary | ICD-10-CM | POA: Insufficient documentation

## 2012-09-03 DIAGNOSIS — R609 Edema, unspecified: Secondary | ICD-10-CM | POA: Insufficient documentation

## 2012-09-03 LAB — BASIC METABOLIC PANEL
BUN: 90 mg/dL — ABNORMAL HIGH (ref 6–23)
CO2: 17 mEq/L — ABNORMAL LOW (ref 19–32)
Chloride: 106 mEq/L (ref 96–112)
Creatinine, Ser: 3.32 mg/dL — ABNORMAL HIGH (ref 0.50–1.35)
Glucose, Bld: 130 mg/dL — ABNORMAL HIGH (ref 70–99)

## 2012-09-03 LAB — CBC WITH DIFFERENTIAL/PLATELET
Eosinophils Absolute: 0.2 10*3/uL (ref 0.0–0.7)
HCT: 28 % — ABNORMAL LOW (ref 39.0–52.0)
Lymphs Abs: 1.4 10*3/uL (ref 0.7–4.0)
Monocytes Relative: 9 % (ref 3–12)
Neutro Abs: 3.9 10*3/uL (ref 1.7–7.7)
Neutrophils Relative %: 64 % (ref 43–77)
Platelets: 213 10*3/uL (ref 150–400)

## 2012-09-03 NOTE — Telephone Encounter (Signed)
Spoke with pt who states he is having a lot of fluid build up in his legs and it hurts to walk.  He reports taking Furosemide "1 pill a day" for swelling but that its not helping not.  Advised to call Dr Lowell Guitar who gave him the medication for adjustment.  Pt states he has already called there and they're no MD's in the office to manage his edema.  I called and spoke with Joann at Dr Roanna Banning office - she had instructed pt to go to the ED. I advised pt again to go to the ED for treatment.  He says he will call EMS to take him.

## 2012-09-03 NOTE — ED Notes (Signed)
Per EMS pt swollen legs for 3 days. Pt had port placed on Tuesday. Pt to hear about possible dialysis treatment for Monday. Denies SOB, CP, dyspnea with exertion. 97%/RA

## 2012-09-03 NOTE — Telephone Encounter (Signed)
New Prob  Pt states that he has a lot of swelling in his legs and it hurts to walk on them. He said he had a cath put it on Tuesday.

## 2012-09-03 NOTE — ED Provider Notes (Signed)
CSN: [id number]     Arrival date & time 09/03/12  1549 History     First MD Initiated Contact with Patient 09/03/12 1748     Chief Complaint  Patient presents with  . Leg Swelling   (Consider location/radiation/quality/duration/timing/severity/associated sxs/prior Treatment) HPI Reports bilateral leg swelling for approximately one week, progressively worsening. No shortness of breath no chest no other complaint no other associated symptoms. The treatment prior to coming here. No pain. Leg swelling is worse when his legs are in a dependent position improved when he elevates his leg. Past Medical History  Diagnosis Date  . Other and unspecified hyperlipidemia   . Insomnia, unspecified   . Obstructive sleep apnea (adult) (pediatric)   . Allergic rhinitis, cause unspecified   . Nonischemic cardiomyopathy   . Unspecified essential hypertension   . Type II or unspecified type diabetes mellitus without mention of complication, not stated as uncontrolled   . CKD (chronic kidney disease) stage 3, GFR 30-59 ml/min   . Esophageal reflux   . Morbid obesity   . Dysuria   . Tobacco use disorder   . Dysrhythmia   . Pacemaker   . Antral ulcer   . Renal azotemia   . Arthritis     Gout w/hyperuricemia  . Morbid obesity    Past Surgical History  Procedure Laterality Date  . Cardiac defibrillator placement  2011  . Esophagogastroduodenoscopy  01/03/2011    Procedure: ESOPHAGOGASTRODUODENOSCOPY (EGD);  Surgeon: Vertell Novak., MD;  Location: [facility] ENDOSCOPY;  Service: Endoscopy;  Laterality: N/A;  . Av fistula placement Right 08/31/2012    Procedure: ARTERIOVENOUS (AV) FISTULA CREATION;  Surgeon: Chuck Hint, MD;  Location: [facility] OR;  Service: Vascular;  Laterality: Right;  . Insertion of dialysis catheter Right 08/31/2012    Procedure: INSERTION OF DIALYSIS CATHETER-Right Internal Jugular Placement;  Surgeon: Chuck Hint, MD;  Location: [facility] OR;  Service: Vascular;  Laterality:  Right;   Family History  Problem Relation Age of Onset  . Diabetes Mother   . Microcephaly Father   . Lung cancer Father    History  Substance Use Topics  . Smoking status: Current Every Day Smoker -- 0.50 packs/day for .5 years    Types: Cigarettes  . Smokeless tobacco: Never Used     Comment: started smoking at age 44. Will stop on his own - cutting back now- was smoking a ppd - now only 3 a day  . Alcohol Use: No     Comment: No alcohol x 1 month.    Review of Systems  Constitutional: Negative.   HENT: Negative.   Respiratory: Negative.   Cardiovascular: Positive for leg swelling.  Gastrointestinal: Negative.   Musculoskeletal: Negative.   Skin: Negative.   Neurological: Negative.   Psychiatric/Behavioral: Negative.   All other systems reviewed and are negative.    Allergies  Ace inhibitors  Home Medications   Current Outpatient Rx  Name  Route  Sig  Dispense  Refill  . acetaminophen (TYLENOL) 325 MG tablet   Oral   Take 650 mg by mouth every 6 (six) hours as needed for pain.         Marland Kitchen colchicine 0.6 MG tablet   Oral   Take 0.6 mg by mouth See admin instructions. Takes every three days.         . diclofenac sodium (VOLTAREN) 1 % GEL   Topical   Apply 2 g topically 4 (four) times daily.          Marland Kitchen  docusate sodium (COLACE) 100 MG capsule   Oral   Take 100 mg by mouth as needed for constipation.          Marland Kitchen esomeprazole (NEXIUM) 20 MG capsule   Oral   Take 1 capsule (20 mg total) by mouth daily before breakfast.   30 capsule   11   . Febuxostat (ULORIC) 80 MG TABS   Oral   Take 1 tablet by mouth daily.         . fluticasone (FLONASE) 50 MCG/ACT nasal spray   Nasal   Place 1 spray into the nose daily as needed for allergies. For allergies         . insulin glargine (LANTUS) 100 UNIT/ML injection   Subcutaneous   Inject 62 Units into the skin at bedtime.         Marland Kitchen loratadine (CLARITIN) 10 MG tablet   Oral   Take 10 mg by mouth  daily.         Marland Kitchen LORazepam (ATIVAN) 0.5 MG tablet   Oral   Take 0.5 mg by mouth 2 (two) times daily as needed for anxiety.         . metoprolol (LOPRESSOR) 100 MG tablet   Oral   Take 100 mg by mouth every morning.          Marland Kitchen oxyCODONE-acetaminophen (ROXICET) 5-325 MG per tablet   Oral   Take 1-2 tablets by mouth every 4 (four) hours as needed for pain.   20 tablet   0   . simvastatin (ZOCOR) 40 MG tablet   Oral   Take 40 mg by mouth every evening.         . traMADol (ULTRAM-ER) 100 MG 24 hr tablet   Oral   Take 100 mg by mouth every 6 (six) hours as needed for pain.          BP 108/73  Pulse 98  Temp(Src) 97.8 F (36.6 C) (Oral)  Resp 21  SpO2 100% Physical Exam  Nursing note and vitals reviewed. Constitutional: He appears well-developed and well-nourished.  HENT:  Head: Normocephalic and atraumatic.  Eyes: Conjunctivae are normal. Pupils are equal, round, and reactive to light.  Neck: Neck supple. No tracheal deviation present. No thyromegaly present.  Cardiovascular: Normal rate and regular rhythm.   No murmur heard. Pulmonary/Chest: Effort normal and breath sounds normal.  Right-sided subclavian dialysis catheter in place  Abdominal: Soft. Bowel sounds are normal. He exhibits no distension. There is no tenderness.  Morbidly obese  Musculoskeletal: Normal range of motion. He exhibits edema. He exhibits no tenderness.  3+ bilateral pretibial pitting edema. DP pulse 2+ bilaterally  Neurological: He is alert. Coordination normal.  Skin: Skin is warm and dry. No rash noted.  Psychiatric: He has a normal mood and affect.    ED Course   Procedures (including critical care time)  Labs Reviewed  BASIC METABOLIC PANEL  CBC WITH DIFFERENTIAL   No results found. No diagnosis found.  Date: 09/03/2012  Rate: 80  Rhythm: normal sinus rhythm  QRS Axis: normal  Intervals: normal  ST/T Wave abnormalities: nonspecific T wave changes  Conduction  Disutrbances:none  Narrative Interpretation:   Old EKG Reviewed: Unchanged until 08/26/2012 interpreted by me A 30 p.m. patient remains asymptomatic aside from leg edema. Denies chest pain denies dyspnea denies other complaint. I reviewed old laboratory work. Anemia is chronic. Patient's renal function is improved over 02/07/2012. Anemia is chronic and unchanged. Results for orders placed during  the hospital encounter of 09/03/12  BASIC METABOLIC PANEL      Result Value Range   Sodium 137  135 - 145 mEq/L   Potassium 4.0  3.5 - 5.1 mEq/L   Chloride 106  96 - 112 mEq/L   CO2 17 (*) 19 - 32 mEq/L   Glucose, Bld 130 (*) 70 - 99 mg/dL   BUN 90 (*) 6 - 23 mg/dL   Creatinine, Ser 0.45 (*) 0.50 - 1.35 mg/dL   Calcium 9.4  8.4 - 40.9 mg/dL   GFR calc non Af Amer 20 (*) >90 mL/min   GFR calc Af Amer 24 (*) >90 mL/min  CBC WITH DIFFERENTIAL      Result Value Range   WBC 6.0  4.0 - 10.5 K/uL   RBC 3.21 (*) 4.22 - 5.81 MIL/uL   Hemoglobin 8.8 (*) 13.0 - 17.0 g/dL   HCT 81.1 (*) 91.4 - 78.2 %   MCV 87.2  78.0 - 100.0 fL   MCH 27.4  26.0 - 34.0 pg   MCHC 31.4  30.0 - 36.0 g/dL   RDW 95.6 (*) 21.3 - 08.6 %   Platelets 213  150 - 400 K/uL   Neutrophils Relative % 64  43 - 77 %   Neutro Abs 3.9  1.7 - 7.7 K/uL   Lymphocytes Relative 24  12 - 46 %   Lymphs Abs 1.4  0.7 - 4.0 K/uL   Monocytes Relative 9  3 - 12 %   Monocytes Absolute 0.5  0.1 - 1.0 K/uL   Eosinophils Relative 3  0 - 5 %   Eosinophils Absolute 0.2  0.0 - 0.7 K/uL   Basophils Relative 1  0 - 1 %   Basophils Absolute 0.0  0.0 - 0.1 K/uL   Dg Chest 2 View  08/26/2012   *RADIOLOGY REPORT*  Clinical Data: Preop for dialysis catheter insertion  CHEST - 2 VIEW  Comparison: 10/28/2011  Findings: Cardiomegaly again noted.  Single lead cardiac pacemaker is unchanged in position.  No acute infiltrate or pleural effusion. No pulmonary edema.  Bony thorax is stable.  IMPRESSION:  No active disease.  Cardiomegaly again noted.  Single lead  cardiac pacemaker in place.   Original Report Authenticated By: Natasha Mead, M.D.   Dg Chest Port 1 View  08/31/2012   *RADIOLOGY REPORT*  Clinical Data: Status post hemodialysis catheter placement  PORTABLE CHEST - 1 VIEW  Comparison: Prior chest x-ray 08/26/2012  Findings: Interval placement of a right IJ approach tunneled hemodialysis catheter.  The catheter tips project over the superior cavoatrial junction.  No evidence of pneumothorax.  Enlargement the cardiopericardial silhouette with pulmonary vascular congestion and mild interstitial edema.  Stable left subclavian approach cardiac rhythm maintenance device with the single lead projecting over the right ventricle.  No acute osseous abnormality.  IMPRESSION:  1.  Right IJ approach tunneled hemodialysis catheter with the tips projecting over the superior cavoatrial junction. 2.  Cardiomegaly with mild CHF versus volume overload.   Original Report Authenticated By: Malachy Moan, M.D.   Dg Fluoro Guide Cv Line-no Report  08/31/2012   CLINICAL DATA: Dialysis Catheter Placement   FLOURO GUIDE CV LINE  Fluoroscopy was utilized by the requesting physician.  No radiographic  interpretation.     MDM  Spoke with Dr.Patel plan increase furosemide 80 mg twice a day from his current dose of 80 mg daily. He is to follow up with Dr. Lowell Guitar and keep his scheduled appointment on 09/06/2012  Diagnosis #1 peripheral edema  #2 chronic renal insufficiency #3 anemia   Doug Sou, MD 09/03/12 2044

## 2012-09-03 NOTE — Telephone Encounter (Signed)
[name] asked if the suture would come out of the tubes in his neck and how long he would have them there. I told him the sutures needed to be there to hold the hemodialysis catheter in. That he would have the catheter until the AVF matured at approximately 12 weeks. I told him to come in and we would look and see if they were indeed coming out. He said he was waiting on Dr Lowell Guitar to call. He said he had too much fluid and couldn't walk because his legs were too big. I told him to call Dr Lowell Guitar and let them know he couldn't walk. He said he would and that he would come see [place] if he "could".

## 2012-09-03 NOTE — ED Notes (Signed)
Pt pants removed to assess leg pain. Pt became increasingly dyspnic with movement. O2sats 82/RA placed on [place] o2sats 94/2L

## 2012-09-06 ENCOUNTER — Other Ambulatory Visit: Payer: Self-pay | Admitting: Internal Medicine

## 2012-09-08 ENCOUNTER — Telehealth: Payer: Self-pay | Admitting: Licensed Clinical Social Worker

## 2012-09-08 NOTE — Telephone Encounter (Signed)
CSW placed call to Mr. Lonigro to discuss pt's request for PCS.  During pt's last [facility] appt.  Pt indicated during nursing assessment that he was functionally independent, physician did not discuss with pt during that visit.  Pt states at the time of that appt he did not know he was going to have to be on dialysis and that he can no longer walk long distances.  CSW requesting Mr. Buenaventura to discuss with PCP on appt 8/14.  Pt states he will need help walking to [facility] for appt.  CSW informed Mr. Zielke to request volunteer assistance at the Main Entrance.  Pt states he has transportation set up for tomorrow's appt but not for dialysis.  Mr. Byers states he does not know how he will get to/from dialysis.  CSW informed Mr. Amparo of medical transportation benefit.  CSW called [facility], referral for transportation completed.

## 2012-09-09 ENCOUNTER — Ambulatory Visit (INDEPENDENT_AMBULATORY_CARE_PROVIDER_SITE_OTHER): Payer: Medicaid Other | Admitting: Internal Medicine

## 2012-09-09 ENCOUNTER — Inpatient Hospital Stay (HOSPITAL_COMMUNITY)
Admission: AD | Admit: 2012-09-09 | Discharge: 2012-09-27 | DRG: 291 | Disposition: A | Discharge status: Home Health | Payer: Medicaid Other | Source: Other Acute Inpatient Hospital | Attending: Internal Medicine | Admitting: Internal Medicine

## 2012-09-09 ENCOUNTER — Encounter: Payer: Self-pay | Admitting: Internal Medicine

## 2012-09-09 VITALS — BP 98/59 | HR 90 | Temp 97.8°F | Ht 61.0 in | Wt 363.8 lb

## 2012-09-09 DIAGNOSIS — I798 Other disorders of arteries, arterioles and capillaries in diseases classified elsewhere: Secondary | ICD-10-CM | POA: Diagnosis present

## 2012-09-09 DIAGNOSIS — F17200 Nicotine dependence, unspecified, uncomplicated: Secondary | ICD-10-CM | POA: Diagnosis present

## 2012-09-09 DIAGNOSIS — F411 Generalized anxiety disorder: Secondary | ICD-10-CM | POA: Diagnosis present

## 2012-09-09 DIAGNOSIS — I4892 Unspecified atrial flutter: Secondary | ICD-10-CM

## 2012-09-09 DIAGNOSIS — S91309A Unspecified open wound, unspecified foot, initial encounter: Secondary | ICD-10-CM

## 2012-09-09 DIAGNOSIS — N185 Chronic kidney disease, stage 5: Secondary | ICD-10-CM

## 2012-09-09 DIAGNOSIS — D649 Anemia, unspecified: Secondary | ICD-10-CM | POA: Diagnosis present

## 2012-09-09 DIAGNOSIS — I472 Ventricular tachycardia, unspecified: Secondary | ICD-10-CM | POA: Diagnosis not present

## 2012-09-09 DIAGNOSIS — Z794 Long term (current) use of insulin: Secondary | ICD-10-CM

## 2012-09-09 DIAGNOSIS — N179 Acute kidney failure, unspecified: Secondary | ICD-10-CM

## 2012-09-09 DIAGNOSIS — M7989 Other specified soft tissue disorders: Secondary | ICD-10-CM

## 2012-09-09 DIAGNOSIS — K219 Gastro-esophageal reflux disease without esophagitis: Secondary | ICD-10-CM | POA: Diagnosis present

## 2012-09-09 DIAGNOSIS — G4700 Insomnia, unspecified: Secondary | ICD-10-CM | POA: Diagnosis not present

## 2012-09-09 DIAGNOSIS — L02619 Cutaneous abscess of unspecified foot: Secondary | ICD-10-CM | POA: Diagnosis present

## 2012-09-09 DIAGNOSIS — E1129 Type 2 diabetes mellitus with other diabetic kidney complication: Secondary | ICD-10-CM

## 2012-09-09 DIAGNOSIS — S91301S Unspecified open wound, right foot, sequela: Secondary | ICD-10-CM

## 2012-09-09 DIAGNOSIS — E876 Hypokalemia: Secondary | ICD-10-CM | POA: Diagnosis present

## 2012-09-09 DIAGNOSIS — I872 Venous insufficiency (chronic) (peripheral): Secondary | ICD-10-CM | POA: Diagnosis present

## 2012-09-09 DIAGNOSIS — N058 Unspecified nephritic syndrome with other morphologic changes: Secondary | ICD-10-CM | POA: Diagnosis present

## 2012-09-09 DIAGNOSIS — N2581 Secondary hyperparathyroidism of renal origin: Secondary | ICD-10-CM | POA: Diagnosis present

## 2012-09-09 DIAGNOSIS — N186 End stage renal disease: Secondary | ICD-10-CM | POA: Diagnosis present

## 2012-09-09 DIAGNOSIS — Z6841 Body Mass Index (BMI) 40.0 and over, adult: Secondary | ICD-10-CM

## 2012-09-09 DIAGNOSIS — I5023 Acute on chronic systolic (congestive) heart failure: Secondary | ICD-10-CM

## 2012-09-09 DIAGNOSIS — M129 Arthropathy, unspecified: Secondary | ICD-10-CM | POA: Diagnosis present

## 2012-09-09 DIAGNOSIS — R112 Nausea with vomiting, unspecified: Secondary | ICD-10-CM | POA: Diagnosis not present

## 2012-09-09 DIAGNOSIS — L97909 Non-pressure chronic ulcer of unspecified part of unspecified lower leg with unspecified severity: Secondary | ICD-10-CM | POA: Diagnosis present

## 2012-09-09 DIAGNOSIS — D631 Anemia in chronic kidney disease: Secondary | ICD-10-CM | POA: Diagnosis present

## 2012-09-09 DIAGNOSIS — Z95810 Presence of automatic (implantable) cardiac defibrillator: Secondary | ICD-10-CM | POA: Diagnosis present

## 2012-09-09 DIAGNOSIS — Z888 Allergy status to other drugs, medicaments and biological substances status: Secondary | ICD-10-CM

## 2012-09-09 DIAGNOSIS — E1165 Type 2 diabetes mellitus with hyperglycemia: Secondary | ICD-10-CM | POA: Diagnosis present

## 2012-09-09 DIAGNOSIS — G4733 Obstructive sleep apnea (adult) (pediatric): Secondary | ICD-10-CM | POA: Diagnosis present

## 2012-09-09 DIAGNOSIS — D7582 Heparin induced thrombocytopenia (HIT): Secondary | ICD-10-CM | POA: Diagnosis not present

## 2012-09-09 DIAGNOSIS — E1159 Type 2 diabetes mellitus with other circulatory complications: Secondary | ICD-10-CM | POA: Diagnosis present

## 2012-09-09 DIAGNOSIS — N250 Renal osteodystrophy: Secondary | ICD-10-CM | POA: Diagnosis present

## 2012-09-09 DIAGNOSIS — D696 Thrombocytopenia, unspecified: Secondary | ICD-10-CM | POA: Diagnosis present

## 2012-09-09 DIAGNOSIS — S91302D Unspecified open wound, left foot, subsequent encounter: Secondary | ICD-10-CM

## 2012-09-09 DIAGNOSIS — J349 Unspecified disorder of nose and nasal sinuses: Secondary | ICD-10-CM

## 2012-09-09 DIAGNOSIS — I4729 Other ventricular tachycardia: Secondary | ICD-10-CM | POA: Diagnosis not present

## 2012-09-09 DIAGNOSIS — D75829 Heparin-induced thrombocytopenia, unspecified: Secondary | ICD-10-CM | POA: Diagnosis not present

## 2012-09-09 DIAGNOSIS — Z992 Dependence on renal dialysis: Secondary | ICD-10-CM

## 2012-09-09 DIAGNOSIS — E785 Hyperlipidemia, unspecified: Secondary | ICD-10-CM | POA: Diagnosis present

## 2012-09-09 DIAGNOSIS — I959 Hypotension, unspecified: Secondary | ICD-10-CM

## 2012-09-09 DIAGNOSIS — N184 Chronic kidney disease, stage 4 (severe): Secondary | ICD-10-CM

## 2012-09-09 DIAGNOSIS — I4891 Unspecified atrial fibrillation: Secondary | ICD-10-CM | POA: Diagnosis not present

## 2012-09-09 DIAGNOSIS — I5022 Chronic systolic (congestive) heart failure: Secondary | ICD-10-CM

## 2012-09-09 DIAGNOSIS — I509 Heart failure, unspecified: Secondary | ICD-10-CM | POA: Diagnosis present

## 2012-09-09 DIAGNOSIS — K259 Gastric ulcer, unspecified as acute or chronic, without hemorrhage or perforation: Secondary | ICD-10-CM

## 2012-09-09 DIAGNOSIS — L97409 Non-pressure chronic ulcer of unspecified heel and midfoot with unspecified severity: Secondary | ICD-10-CM | POA: Diagnosis present

## 2012-09-09 DIAGNOSIS — M79671 Pain in right foot: Secondary | ICD-10-CM

## 2012-09-09 DIAGNOSIS — E8779 Other fluid overload: Secondary | ICD-10-CM | POA: Diagnosis present

## 2012-09-09 DIAGNOSIS — I83009 Varicose veins of unspecified lower extremity with ulcer of unspecified site: Secondary | ICD-10-CM

## 2012-09-09 DIAGNOSIS — I428 Other cardiomyopathies: Secondary | ICD-10-CM | POA: Diagnosis present

## 2012-09-09 DIAGNOSIS — S91302A Unspecified open wound, left foot, initial encounter: Secondary | ICD-10-CM

## 2012-09-09 DIAGNOSIS — M109 Gout, unspecified: Secondary | ICD-10-CM | POA: Diagnosis present

## 2012-09-09 DIAGNOSIS — I10 Essential (primary) hypertension: Secondary | ICD-10-CM | POA: Diagnosis present

## 2012-09-09 DIAGNOSIS — M25569 Pain in unspecified knee: Secondary | ICD-10-CM | POA: Diagnosis not present

## 2012-09-09 DIAGNOSIS — E559 Vitamin D deficiency, unspecified: Secondary | ICD-10-CM | POA: Diagnosis present

## 2012-09-09 DIAGNOSIS — IMO0001 Reserved for inherently not codable concepts without codable children: Principal | ICD-10-CM | POA: Diagnosis present

## 2012-09-09 LAB — COMPREHENSIVE METABOLIC PANEL
ALT: 6 U/L (ref 0–53)
Alkaline Phosphatase: 145 U/L — ABNORMAL HIGH (ref 39–117)
CO2: 18 mEq/L — ABNORMAL LOW (ref 19–32)
GFR calc Af Amer: 23 mL/min — ABNORMAL LOW (ref >90)
GFR calc non Af Amer: 20 mL/min — ABNORMAL LOW (ref >90)
Glucose, Bld: 124 mg/dL — ABNORMAL HIGH (ref 70–99)
Potassium: 3.4 mEq/L — ABNORMAL LOW (ref 3.5–5.1)
Sodium: 137 mEq/L (ref 135–145)

## 2012-09-09 LAB — BASIC METABOLIC PANEL
CO2: 19 mEq/L (ref 19–32)
Calcium: 8.9 mg/dL (ref 8.4–10.5)
Glucose, Bld: 169 mg/dL — ABNORMAL HIGH (ref 70–99)
Potassium: 3.4 mEq/L — ABNORMAL LOW (ref 3.5–5.3)
Sodium: 138 mEq/L (ref 135–145)

## 2012-09-09 LAB — GLUCOSE, CAPILLARY: Glucose-Capillary: 201 mg/dL — ABNORMAL HIGH (ref 70–99)

## 2012-09-09 LAB — CBC
Hemoglobin: 8.7 g/dL — ABNORMAL LOW (ref 13.0–17.0)
RBC: 3.27 MIL/uL — ABNORMAL LOW (ref 4.22–5.81)
WBC: 6.7 10*3/uL (ref 4.0–10.5)

## 2012-09-09 MED ORDER — NEPRO/CARBSTEADY PO LIQD
237.0000 mL | ORAL | Status: DC | PRN
  Filled 2012-09-09: qty 237

## 2012-09-09 MED ORDER — METOPROLOL TARTRATE 100 MG PO TABS
100.0000 mg | ORAL_TABLET | Freq: Every morning | ORAL | Status: DC
  Administered 2012-09-11: 100 mg via ORAL
  Filled 2012-09-09 (×3): qty 1

## 2012-09-09 MED ORDER — LIDOCAINE HCL (PF) 1 % IJ SOLN: 5.0000 mL | INTRAMUSCULAR | Status: DC | PRN

## 2012-09-09 MED ORDER — LORAZEPAM 0.5 MG PO TABS: 0.5000 mg | ORAL_TABLET | Freq: Two times a day (BID) | ORAL | Status: DC | PRN

## 2012-09-09 MED ORDER — SODIUM CHLORIDE 0.9 % IV SOLN: 100.0000 mL | INTRAVENOUS | Status: DC | PRN

## 2012-09-09 MED ORDER — INSULIN GLARGINE 100 UNIT/ML ~~LOC~~ SOLN
50.0000 [IU] | Freq: Every day | SUBCUTANEOUS | Status: DC
  Administered 2012-09-10 – 2012-09-11 (×2): 50 [IU] via SUBCUTANEOUS
  Filled 2012-09-09 (×5): qty 0.5

## 2012-09-09 MED ORDER — PANTOPRAZOLE SODIUM 40 MG PO TBEC
40.0000 mg | DELAYED_RELEASE_TABLET | Freq: Every day | ORAL | Status: DC
  Administered 2012-09-10 – 2012-09-27 (×19): 40 mg via ORAL
  Filled 2012-09-09 (×16): qty 1

## 2012-09-09 MED ORDER — HEPARIN SODIUM (PORCINE) 1000 UNIT/ML DIALYSIS
20.0000 [IU]/kg | INTRAMUSCULAR | Status: DC | PRN
  Administered 2012-09-09: 3300 [IU] via INTRAVENOUS_CENTRAL

## 2012-09-09 MED ORDER — OXYCODONE-ACETAMINOPHEN 5-325 MG PO TABS
1.0000 | ORAL_TABLET | ORAL | Status: DC | PRN
  Administered 2012-09-10: 2 via ORAL
  Filled 2012-09-09: qty 2

## 2012-09-09 MED ORDER — DOCUSATE SODIUM 100 MG PO CAPS
100.0000 mg | ORAL_CAPSULE | Freq: Two times a day (BID) | ORAL | Status: DC | PRN
  Filled 2012-09-09: qty 1

## 2012-09-09 MED ORDER — SIMVASTATIN 40 MG PO TABS
40.0000 mg | ORAL_TABLET | Freq: Every evening | ORAL | Status: DC
  Administered 2012-09-10 – 2012-09-21 (×12): 40 mg via ORAL
  Filled 2012-09-09 (×15): qty 1

## 2012-09-09 MED ORDER — PENTAFLUOROPROP-TETRAFLUOROETH EX AERO: 1.0000 "application " | INHALATION_SPRAY | CUTANEOUS | Status: DC | PRN

## 2012-09-09 MED ORDER — FEBUXOSTAT 80 MG PO TABS: 1.0000 | ORAL_TABLET | Freq: Every day | ORAL | Status: DC

## 2012-09-09 MED ORDER — HEPARIN SODIUM (PORCINE) 5000 UNIT/ML IJ SOLN
5000.0000 [IU] | Freq: Three times a day (TID) | INTRAMUSCULAR | Status: DC
  Administered 2012-09-10 – 2012-09-13 (×10): 5000 [IU] via SUBCUTANEOUS
  Filled 2012-09-09 (×14): qty 1

## 2012-09-09 MED ORDER — HEPARIN SODIUM (PORCINE) 1000 UNIT/ML DIALYSIS
1000.0000 [IU] | INTRAMUSCULAR | Status: DC | PRN
  Administered 2012-09-09: 4600 [IU] via INTRAVENOUS_CENTRAL
  Filled 2012-09-09: qty 1

## 2012-09-09 MED ORDER — TRAMADOL HCL ER 100 MG PO TB24: 100.0000 mg | ORAL_TABLET | Freq: Four times a day (QID) | ORAL | Status: DC | PRN

## 2012-09-09 MED ORDER — INSULIN ASPART 100 UNIT/ML ~~LOC~~ SOLN
0.0000 [IU] | Freq: Three times a day (TID) | SUBCUTANEOUS | Status: DC
  Administered 2012-09-10: 1 [IU] via SUBCUTANEOUS
  Administered 2012-09-10: 2 [IU] via SUBCUTANEOUS
  Administered 2012-09-10 – 2012-09-14 (×7): 1 [IU] via SUBCUTANEOUS
  Administered 2012-09-14 – 2012-09-15 (×3): 2 [IU] via SUBCUTANEOUS
  Administered 2012-09-16: 1 [IU] via SUBCUTANEOUS
  Administered 2012-09-16 – 2012-09-17 (×4): 2 [IU] via SUBCUTANEOUS
  Administered 2012-09-18: 1 [IU] via SUBCUTANEOUS
  Administered 2012-09-18 – 2012-09-19 (×2): 2 [IU] via SUBCUTANEOUS
  Administered 2012-09-19 – 2012-09-20 (×3): 1 [IU] via SUBCUTANEOUS
  Administered 2012-09-23: 2 [IU] via SUBCUTANEOUS
  Administered 2012-09-24: 1 [IU] via SUBCUTANEOUS
  Administered 2012-09-25 – 2012-09-27 (×3): 2 [IU] via SUBCUTANEOUS
  Administered 2012-09-27: 1 [IU] via SUBCUTANEOUS

## 2012-09-09 MED ORDER — DICLOFENAC SODIUM 1 % TD GEL: 2.0000 g | Freq: Four times a day (QID) | TRANSDERMAL | Status: DC | PRN

## 2012-09-09 MED ORDER — ALTEPLASE 2 MG IJ SOLR
2.0000 mg | Freq: Once | INTRAMUSCULAR | Status: AC | PRN
  Filled 2012-09-09: qty 2

## 2012-09-09 MED ORDER — LIDOCAINE-PRILOCAINE 2.5-2.5 % EX CREA
1.0000 "application " | TOPICAL_CREAM | CUTANEOUS | Status: DC | PRN
  Filled 2012-09-09: qty 5

## 2012-09-09 MED ORDER — ACETAMINOPHEN 325 MG PO TABS: 650.0000 mg | ORAL_TABLET | Freq: Four times a day (QID) | ORAL | Status: DC | PRN

## 2012-09-09 NOTE — Progress Notes (Signed)
IV start attempted x 1 in left wrist, IV team paged, stated to call when pt is taken to floor

## 2012-09-09 NOTE — Progress Notes (Signed)
Pt had first hemodialysis Tx. Had some problems with catheter running high pressures despite power flushing and reversing the lines. Ran a 4hr Tx and pulled 4l of fluid. Had occasional drop in BP and tachycardia (up to 130's) but was asymptomatic and did not sustain them. Goal was met.

## 2012-09-09 NOTE — Assessment & Plan Note (Signed)
Patient presents w/ significant fluid overload today. He has gained ~50 lbs since the beginning of July. He follows w/ Dr. Lowell Guitar at [facility] and was scheduled to start dialysis tomorrow. He recently had an AV fistula placed in his R radial about 2 weeks ago. He is being admitted today to get HD tonight, tomorrow, and Saturday in order to get his fluid status under control.

## 2012-09-09 NOTE — Progress Notes (Signed)
09/09/2012 6:27 PM  Pt to HD. Resident notified. Theadora Rama

## 2012-09-09 NOTE — Progress Notes (Signed)
IV start attempted X 1 in left hand

## 2012-09-09 NOTE — H&P (Signed)
Date: 09/09/2012               Patient Name:  Adam [name] MRN: [id number]  DOB: 1965/07/03 Age / Sex: 47 y.o., male   PCP: Lars Masson, MD         Medical Service: Internal Medicine Teaching Service         Attending Physician: Dr. Jonah Blue, DO    First Contact: Dr. Angelina Sheriff, MD Pager: [phone]  Second Contact: Dr. Dede Query, MD Pager: [phone]       After Hours (After 5p/  First Contact Pager: [phone]  weekends / holidays): Second Contact Pager: [phone]   Chief Complaint: 50 lb weight gain  History of Present Illness: Adam [name] is a 46 y.o. man with a pmhx of ESRD c/b cardiorenal syndrome, CHF, Sleep Apnea, and gout who comes to the clinic with a chief complaint of a 50 lb weight gain over the last 1 month. The patient has been seeing a nephrologist who had changed his diuretics as his Cr was > 6. However, fluid retention had necessitated dialysis. The patient planned to start dialysis tomorrow and has a HD cath in place. The patient was recently seen in the ED and his torsemide was increased to 160 mg BID. Over the last week the patient has had good urine output, but his swelling has moved from his legs to his torso. He also endorses increasing shortness of breath during this time. He denies fevers, chills, nausea, and vomiting.  Patient recently had placement of a R radial cephalic AV fistula for HD d/t his ESRD. Last Cr was 3.32, but he has been in the 6-7 range previously (08/06/12-08/11/12). He has had many issues recently w/ volume overload and follows w/ Dr. Lowell Guitar for his ESRD. According to the patient, he takes lopressor, aldactone, torsemide, and metolazone.   Adam [name] is to be admitted for his significant fluid overload and started on HD tonight as per Dr. Lowell Guitar.   Meds: No current facility-administered medications for this encounter.    Allergies: Allergies as of 09/09/2012 - Review Complete 09/09/2012  Allergen Reaction Noted  . Ace inhibitors  06/24/2012     Past Medical History  Diagnosis Date  . Other and unspecified hyperlipidemia   . Insomnia, unspecified   . Obstructive sleep apnea (adult) (pediatric)   . Allergic rhinitis, cause unspecified   . Nonischemic cardiomyopathy   . Unspecified essential hypertension   . Type II or unspecified type diabetes mellitus without mention of complication, not stated as uncontrolled   . CKD (chronic kidney disease) stage 3, GFR 30-59 ml/min   . Esophageal reflux   . Morbid obesity   . Dysuria   . Tobacco use disorder   . Dysrhythmia   . Pacemaker   . Antral ulcer   . Renal azotemia   . Arthritis     Gout w/hyperuricemia  . Morbid obesity    Past Surgical History  Procedure Laterality Date  . Cardiac defibrillator placement  2011  . Esophagogastroduodenoscopy  01/03/2011    Procedure: ESOPHAGOGASTRODUODENOSCOPY (EGD);  Surgeon: Vertell Novak., MD;  Location: [facility] ENDOSCOPY;  Service: Endoscopy;  Laterality: N/A;  . Av fistula placement Right 08/31/2012    Procedure: ARTERIOVENOUS (AV) FISTULA CREATION;  Surgeon: Chuck Hint, MD;  Location: [facility] OR;  Service: Vascular;  Laterality: Right;  . Insertion of dialysis catheter Right 08/31/2012    Procedure: INSERTION OF DIALYSIS CATHETER-Right Internal Jugular Placement;  Surgeon: Cristal Deer  Adele Dan, MD;  Location: [facility] OR;  Service: Vascular;  Laterality: Right;   Family History  Problem Relation Age of Onset  . Diabetes Mother   . Microcephaly Father   . Lung cancer Father    History   Social History  . Marital Status: Single    Spouse Name: N/A    Number of Children: N/A  . Years of Education: N/A   Occupational History  . Not on file.   Social History Main Topics  . Smoking status: Current Every Day Smoker -- 0.50 packs/day for .5 years    Types: Cigarettes  . Smokeless tobacco: Never Used     Comment: started smoking at age 62. Will stop on his own - cutting back now- was smoking a ppd - now only 3 a day  . Alcohol  Use: No     Comment: No alcohol x 1 month.  . Drug Use: No  . Sexual Activity: Not on file   Other Topics Concern  . Not on file   Social History Narrative  . No narrative on file    Review of Systems: Review of Systems:  General: Positive for 50 lb weight gain in 1 month. Denies fever, chills, diaphoresis, appetite change and fatigue.  HEENT: Denies change in vision, eye pain, redness, hearing loss, congestion, sore throat, rhinorrhea, sneezing, mouth sores, trouble swallowing, neck pain, neck stiffness and tinnitus.  Respiratory: Positive for SOB, DOE, chest tightness. Denies wheezing, or cough.  Cardiovascular: Positive for severe leg swelling, PND and orthopnea. Denies chest pain, palpitations.  Gastrointestinal: Denies nausea, vomiting, abdominal pain, diarrhea, constipation, blood in stool and abdominal distention.  Genitourinary: Denies dysuria, urgency, frequency, hematuria, flank pain and difficulty urinating.  Endocrine: Denies hot or cold intolerance, sweats, polyuria, polydipsia.  Musculoskeletal: Positive for joint pain and swelling and gait problem 2/2 severe fluid overload. Denies myalgias, arthralgias and gait problem.  Skin: Denies pallor, rash and wounds.  Neurological: Denies dizziness, seizures, syncope, weakness, lightheadedness, numbness and headaches.  Hematological: Denies adenopathy,easy bruising, personal or family bleeding history.  Psychiatric/Behavioral: Denies mood changes, confusion, nervousness, sleep disturbance and agitation.   Physical Exam: T: 97.8, P 73, RR 20, BP 113/76, 98% on RA  There were no vitals filed for this visit.  Constitutional: Vital signs reviewed. Overweight male, NAD. Alert and oriented x3.  Head: Normocephalic and atraumatic  Ear: TM normal bilaterally. No erythema or effusions appreciated.  Mouth: no erythema or exudates, MMM  Eyes: PERRL, EOMI, conjunctivae normal  Neck: Supple, Trachea midline normal ROM  Cardiovascular:  RRR, S1 normal, S2 normal, pulses symmetric and intact bilaterally. Not able to assess in LE 2/2 severe edema.  Pulmonary/Chest: Air entry equal bilaterally. Crackles present in all lung fields. No wheezing.  Abdominal: Soft. Non-tender, non-distended, bowel sounds are normal, no masses, organomegaly, or guarding present.  Musculoskeletal: No synovitis, effusions, or erythema of bilateral upper and lower  appendicular joints  Neurological: A&O x3, Strength is normal and symmetric bilaterally, cranial nerve II-XII are grossly intact, no focal motor deficit, sensory intact to light touch bilaterally.  Skin: Chronic venous stasis changes of bilateral lower legs, w/ oozing lesions on his legs 2/2 severe edema. 3+ pitting edema up to the middle of his back. Varicosities present on medial portion of left ankle. Psychiatric: Normal mood and affect.   Lab results: Basic Metabolic Panel:  Recent Labs  16/10/96 1452  NA 138  K 3.4*  CL 102  CO2 19  GLUCOSE 169*  BUN  95*  CREATININE 3.68*  CALCIUM 8.9   CBG:  Recent Labs  09/09/12 1337  GLUCAP 201*   Urine Drug Screen: Drugs of Abuse     Component Value Date/Time   LABOPIA NEG 03/17/2006 1455   COCAINSCRNUR NEG 03/17/2006 1455   LABBENZ NEG 03/17/2006 1455   AMPHETMU NEG 03/17/2006 1455    Other results: EKG: Sinus rhythm Sinus pause Short PR interval Abnormal lateral Q waves Anterior infarct, old Prolonged QT interval   Assessment & Plan by Problem: Principal Problem:   CKD (chronic kidney disease) stage 4, GFR 15-29 ml/min Active Problems:   Type II or unspecified type diabetes mellitus with renal manifestations, uncontrolled(250.42)   DYSLIPIDEMIA   Hypopotassemia   Morbid obesity   ANXIETY   TOBACCO ABUSE   OBSTRUCTIVE SLEEP APNEA   HYPERTENSION   CARDIOMYOPATHY   Chronic systolic heart failure   GERD   Gastric ulcer   Leg swelling   Knee pain   ICD (implantable cardioverter-defibrillator), single, in situ    Normocytic anemia   Open wound of foot   End stage renal disease  1. ESRD Patient has severe fluid overload refractory to diuretic therapy necessitating urgent diuresis. The renal team plans to perform HD tonight, tomorrow, and Sat. - CBC - CMP  - Phosphorous - Hep B S Ag - Renal on board, appreciate rec's  2. DM c/b dyslipidemia - Lantus 50 U q pm - SSI for meals - Continue home statin  3. CHF with EF of 25% - ICD in place - Metoprolol 100 mg q am  4. OSA - CPAP  5. Anxiety - Ativan 0.5 mg prn  6. Knee Pain - Tylenold prn for mild pain - Tramadol q 6 hr prn for mod apin - Oxycodone-APAP for severe pain - Voltaren Gel   7. Gout - Febuxostat  8. GERD - Continue home PPI  9. Diet - Renal Diet  9. Prophylaxis - sq heparin  Dispo: Disposition is deferred at this time, awaiting improvement of current medical problems. Anticipated discharge in approximately 3-4 day(s).   The patient does have a current PCP Lars Masson, MD) and does need an [facility] hospital follow-up appointment after discharge.  The patient does have transportation limitations that hinder transportation to clinic appointments.  Signed: Pleas Koch, MD 09/09/2012, 6:08 PM

## 2012-09-09 NOTE — Progress Notes (Signed)
09/09/2012 6:25 PM  Resident paged to notify of patient's arrival.  Awaiting orders. Pt stable at this time. Adam [name]

## 2012-09-09 NOTE — Progress Notes (Signed)
Patient ID: Adam [name], male   DOB: 05/14/1965, 48 y.o.   MRN: [id number]  Subjective:   Patient ID: Adam [name] male   DOB: 1965-01-29 47 y.o.   MRN: [id number]  HPI: [name] is a 47 y.o. male with past medical history of stage IV CKD, CHF with ejection fraction 25% (AICD), obstructive sleep apnea, hypertension, gout, and type 2 diabetes presenting today for diabetes follow up. Last HbA1c was 9.1 on 07/27/12. Has had a history of poor blood sugar control.  Today [name] presents very fluid overloaded. He has gained over 50 lbs since my last visit with him on 07/27/12. He states his fluid overload is so bad it has inhibited his ability to walk. He presents w/ complaints of PND and orthopnea as well as SOB at rest and DOE. He denies any chest pain, fever, chills, dizziness, lightheadedness, cough, or diarrhea.  Patient recently had placement of a R radial cephalic AV fistula for HD d/t his ESRD. Last Cr was 3.32, but he has been in the 6-7 range previously (08/06/12-08/11/12). He has had many issues recently w/ volume overload and follows w/ Dr. Lowell Guitar for his ESRD. According to the patient, he takes lopressor, aldactone, torsemide, and metolazone. [name] is to be admitted for his significant fluid overload and started on HD tonight as per Dr. Lowell Guitar.  Past Medical History  Diagnosis Date  . Other and unspecified hyperlipidemia   . Insomnia, unspecified   . Obstructive sleep apnea (adult) (pediatric)   . Allergic rhinitis, cause unspecified   . Nonischemic cardiomyopathy   . Unspecified essential hypertension   . Type II or unspecified type diabetes mellitus without mention of complication, not stated as uncontrolled   . CKD (chronic kidney disease) stage 3, GFR 30-59 ml/min   . Esophageal reflux   . Morbid obesity   . Dysuria   . Tobacco use disorder   . Dysrhythmia   . Pacemaker   . Antral ulcer   . Renal azotemia   . Arthritis     Gout w/hyperuricemia  . Morbid obesity     Current Outpatient Prescriptions  Medication Sig Dispense Refill  . acetaminophen (TYLENOL) 325 MG tablet Take 650 mg by mouth every 6 (six) hours as needed for pain.      Marland Kitchen colchicine 0.6 MG tablet Take 0.6 mg by mouth See admin instructions. Takes every three days.      . diclofenac sodium (VOLTAREN) 1 % GEL Apply 2 g topically 4 (four) times daily.       Marland Kitchen docusate sodium (COLACE) 100 MG capsule Take 100 mg by mouth as needed for constipation.       Marland Kitchen esomeprazole (NEXIUM) 20 MG capsule Take 1 capsule (20 mg total) by mouth daily before breakfast.  30 capsule  11  . Febuxostat (ULORIC) 80 MG TABS Take 1 tablet by mouth daily.      . fluticasone (FLONASE) 50 MCG/ACT nasal spray Place 1 spray into the nose daily as needed for allergies. For allergies      . insulin glargine (LANTUS) 100 UNIT/ML injection Inject 62 Units into the skin at bedtime.      Marland Kitchen loratadine (CLARITIN) 10 MG tablet Take 10 mg by mouth daily.      Marland Kitchen LORazepam (ATIVAN) 0.5 MG tablet Take 0.5 mg by mouth 2 (two) times daily as needed for anxiety.      . metoprolol (LOPRESSOR) 100 MG tablet Take 100 mg by mouth every morning.       Marland Kitchen  oxyCODONE-acetaminophen (ROXICET) 5-325 MG per tablet Take 1-2 tablets by mouth every 4 (four) hours as needed for pain.  20 tablet  0  . simvastatin (ZOCOR) 40 MG tablet Take 40 mg by mouth every evening.      . traMADol (ULTRAM-ER) 100 MG 24 hr tablet Take 100 mg by mouth every 6 (six) hours as needed for pain.       No current facility-administered medications for this visit.   Family History  Problem Relation Age of Onset  . Diabetes Mother   . Microcephaly Father   . Lung cancer Father    History   Social History  . Marital Status: Single    Spouse Name: N/A    Number of Children: N/A  . Years of Education: N/A   Social History Main Topics  . Smoking status: Current Every Day Smoker -- 0.50 packs/day for .5 years    Types: Cigarettes  . Smokeless tobacco: Never Used      Comment: started smoking at age 69. Will stop on his own - cutting back now- was smoking a ppd - now only 3 a day  . Alcohol Use: No     Comment: No alcohol x 1 month.  . Drug Use: No  . Sexual Activity: Not on file   Other Topics Concern  . Not on file   Social History Narrative  . No narrative on file   Review of Systems: General: Positive for 50 lb weight gain in 1 month. Denies fever, chills, diaphoresis, appetite change and fatigue.  HEENT: Denies change in vision, eye pain, redness, hearing loss, congestion, sore throat, rhinorrhea, sneezing, mouth sores, trouble swallowing, neck pain, neck stiffness and tinnitus.   Respiratory: Positive for SOB, DOE, chest tightness. Denies wheezing, or cough.  Cardiovascular: Positive for severe leg swelling, PND and orthopnea. Denies chest pain, palpitations. Gastrointestinal: Denies nausea, vomiting, abdominal pain, diarrhea, constipation, blood in stool and abdominal distention.  Genitourinary: Denies dysuria, urgency, frequency, hematuria, flank pain and difficulty urinating.  Endocrine: Denies hot or cold intolerance, sweats, polyuria, polydipsia. Musculoskeletal: Positive for joint pain and swelling and gait problem 2/2 severe fluid overload. Denies myalgias, arthralgias and gait problem.  Skin: Denies pallor, rash and wounds.  Neurological: Denies dizziness, seizures, syncope, weakness, lightheadedness, numbness and headaches.  Hematological: Denies adenopathy,easy bruising, personal or family bleeding history.  Psychiatric/Behavioral: Denies mood changes, confusion, nervousness, sleep disturbance and agitation.   Objective:  Physical Exam: There were no vitals filed for this visit. Constitutional: Vital signs reviewed.  Overweight male, NAD. Alert and oriented x3.  Head: Normocephalic and atraumatic Ear: TM normal bilaterally. No erythema or effusions appreciated. Mouth: no erythema or exudates, MMM Eyes: PERRL, EOMI, conjunctivae  normal Neck: Supple, Trachea midline normal ROM Cardiovascular: RRR, S1 normal, S2 normal, pulses symmetric and intact bilaterally. Not able to assess in LE 2/2 severe edema. Pulmonary/Chest: Air entry equal bilaterally. Crackles present in all lung fields. No wheezing. Abdominal: Soft. Non-tender, non-distended, bowel sounds are normal, no masses, organomegaly, or guarding present.  Musculoskeletal: No synovitis, effusions, or erythema of bilateral upper and lower appendicular joints  Neurological: A&O x3, Strength is normal and symmetric bilaterally, cranial nerve II-XII are grossly intact, no focal motor deficit, sensory intact to light touch bilaterally.  Skin: Chronic venous stasis changes of bilateral lower legs, w/ oozing lesions on his legs 2/2 severe edema. 3+ pitting edema up to the middle of his back. Varicosities present on medial portion of left ankle. Psychiatric: Normal mood and  affect.  Assessment & Plan:   See problem based assessment and plan.

## 2012-09-09 NOTE — Progress Notes (Signed)
09/09/2012 6:51 PM  Pt direct admit to room 6East room 12.  Pt alert and oriented, ambulatory with one assist.  No c/o pain.  Accompanied by significant other, with whom he resides.  Pt vitals stable.  HD on their way currently to pick up thepatient for first HD treatment.  Pt oriented to room/unit, and was instructed on how to utilize the call bell, to which he verbalized understanding.  Will continue to monitor patient. Adam [name]

## 2012-09-09 NOTE — Consult Note (Signed)
HPI:  Adam [name] is a 47 y.o. male who is not yet on dialysis. Dialysis was scheduled to begin in AM at the kidney center. He is right-handed. He has an AICD on the left side. He had a right arm AV Cimino fistula and perm cath placed last week. He has a history of cardiomyopathy and CKD with cardiorenal syndrome with difficulty managing volume and severe azotemia. Recently his diuretics were reduced because of AKI and creatinine > 6 but volume excess has necessitated starting dialysis. He was admitted today from teaching service with complaints of worsening debilitating edema and some shortness of breath.  Past Medical History  Diagnosis Date  . Other and unspecified hyperlipidemia   . Insomnia, unspecified   . Obstructive sleep apnea (adult) (pediatric)   . Allergic rhinitis, cause unspecified   . Nonischemic cardiomyopathy   . Unspecified essential hypertension   . Type II or unspecified type diabetes mellitus without mention of complication, not stated as uncontrolled   . CKD (chronic kidney disease) stage 3, GFR 30-59 ml/min   . Esophageal reflux   . Morbid obesity   . Dysuria   . Tobacco use disorder   . Dysrhythmia   . Pacemaker   . Antral ulcer   . Renal azotemia   . Arthritis     Gout w/hyperuricemia  . Morbid obesity    Past Surgical History  Procedure Laterality Date  . Cardiac defibrillator placement  2011  . Esophagogastroduodenoscopy  01/03/2011    Procedure: ESOPHAGOGASTRODUODENOSCOPY (EGD);  Surgeon: Vertell Novak., MD;  Location[facility] ENDOSCOPY;  Service: Endoscopy;  Laterality: N/A;  . Av fistula placement Right 08/31/2012    Procedure: ARTERIOVENOUS (AV) FISTULA CREATION;  Surgeon: Chuck Hint, MD;  Location: [facility] OR;  Service: Vascular;  Laterality: Right;  . Insertion of dialysis catheter Right 08/31/2012    Procedure: INSERTION OF DIALYSIS CATHETER-Right Internal Jugular Placement;  Surgeon: Chuck Hint, MD;  Location: [facility] OR;  Service: Vascular;   Laterality: Right;   Social History:  reports that he has been smoking Cigarettes.  He has a .25 pack-year smoking history. He has never used smokeless tobacco. He reports that he does not drink alcohol or use illicit drugs. Allergies:  Allergies  Allergen Reactions  . Ace Inhibitors     Acute renal failure with multiple trials   Family History  Problem Relation Age of Onset  . Diabetes Mother   . Microcephaly Father   . Lung cancer Father     Medications:  Prior to Admission:  Prescriptions prior to admission  Medication Sig Dispense Refill  . acetaminophen (TYLENOL) 325 MG tablet Take 650 mg by mouth every 6 (six) hours as needed for pain.      Marland Kitchen colchicine 0.6 MG tablet Take 0.6 mg by mouth See admin instructions. Takes every three days.      . diclofenac sodium (VOLTAREN) 1 % GEL Apply 2 g topically 4 (four) times daily.       Marland Kitchen docusate sodium (COLACE) 100 MG capsule Take 100 mg by mouth as needed for constipation.       Marland Kitchen esomeprazole (NEXIUM) 20 MG capsule Take 1 capsule (20 mg total) by mouth daily before breakfast.  30 capsule  11  . Febuxostat (ULORIC) 80 MG TABS Take 1 tablet by mouth daily.      . fluticasone (FLONASE) 50 MCG/ACT nasal spray Place 1 spray into the nose daily as needed for allergies. For allergies      .  insulin glargine (LANTUS) 100 UNIT/ML injection Inject 62 Units into the skin at bedtime.      Marland Kitchen loratadine (CLARITIN) 10 MG tablet Take 10 mg by mouth daily.      Marland Kitchen LORazepam (ATIVAN) 0.5 MG tablet Take 0.5 mg by mouth 2 (two) times daily as needed for anxiety.      . metoprolol (LOPRESSOR) 100 MG tablet Take 100 mg by mouth every morning.       Marland Kitchen oxyCODONE-acetaminophen (ROXICET) 5-325 MG per tablet Take 1-2 tablets by mouth every 4 (four) hours as needed for pain.  20 tablet  0  . simvastatin (ZOCOR) 40 MG tablet Take 40 mg by mouth every evening.      . traMADol (ULTRAM-ER) 100 MG 24 hr tablet Take 100 mg by mouth every 6 (six) hours as needed for  pain.       ROS: He c/o debilitating edema, SOB and malaise   See vitals General appearance: alert and cooperative Head: Normocephalic, without obvious abnormality, atraumatic Eyes: negative Nose: no discharge Throat: lips, mucosa, and tongue normal; teeth and gums normal Resp: distant Chest wall: no tenderness  Right chest PC Cardio: regular rate and rhythm, S1, S2 normal, no murmur, click, rub or gallop GI: obese Extremities: edema tense, tight, 4+, bilat right wrist fistula with good thrill Skin: chronic changes bilat lower extremities Neurologic: Grossly normal Results for orders placed in visit on 09/09/12 (from the past 48 hour(s))  GLUCOSE, CAPILLARY     Status: Abnormal   Collection Time    09/09/12  1:37 PM      Result Value Range   Glucose-Capillary 201 (*) 70 - 99 mg/dL  BASIC METABOLIC PANEL     Status: Abnormal   Collection Time    09/09/12  2:52 PM      Result Value Range   Sodium 138  135 - 145 mEq/L   Potassium 3.4 (*) 3.5 - 5.3 mEq/L   Chloride 102  96 - 112 mEq/L   CO2 19  19 - 32 mEq/L   Glucose, Bld 169 (*) 70 - 99 mg/dL   BUN 95 (*) 6 - 23 mg/dL   Creat 1.19 (*) 1.47 - 1.35 mg/dL   Calcium 8.9  8.4 - 82.9 mg/dL   No results found.  Assessment:  1 End Stage Renal Disease with massive volume Overload, New Start 2 Cardiomyopathy with AICD 3 Obesity 4 DM 5 HTN, now hypotensive 6 S/P R Cimino and PC  Plan: 1 Dialysis tonight for volume as tolerated, check Hbg  2 Dialysis again on Friday  Irvan Tiedt C 09/09/2012, 6:19 PM

## 2012-09-09 NOTE — Procedures (Signed)
First HD treatment. After 500 cc off pt notes improved breathing.  RN reports some catheter difficulty after 90 min, will therefore increase heparin. ACPowell

## 2012-09-10 ENCOUNTER — Inpatient Hospital Stay (HOSPITAL_COMMUNITY): Payer: Medicaid Other

## 2012-09-10 DIAGNOSIS — E1165 Type 2 diabetes mellitus with hyperglycemia: Secondary | ICD-10-CM

## 2012-09-10 DIAGNOSIS — I120 Hypertensive chronic kidney disease with stage 5 chronic kidney disease or end stage renal disease: Secondary | ICD-10-CM

## 2012-09-10 DIAGNOSIS — I428 Other cardiomyopathies: Secondary | ICD-10-CM

## 2012-09-10 DIAGNOSIS — F17200 Nicotine dependence, unspecified, uncomplicated: Secondary | ICD-10-CM

## 2012-09-10 DIAGNOSIS — N179 Acute kidney failure, unspecified: Secondary | ICD-10-CM

## 2012-09-10 DIAGNOSIS — Z95810 Presence of automatic (implantable) cardiac defibrillator: Secondary | ICD-10-CM

## 2012-09-10 DIAGNOSIS — E785 Hyperlipidemia, unspecified: Secondary | ICD-10-CM

## 2012-09-10 DIAGNOSIS — I959 Hypotension, unspecified: Secondary | ICD-10-CM

## 2012-09-10 DIAGNOSIS — K259 Gastric ulcer, unspecified as acute or chronic, without hemorrhage or perforation: Secondary | ICD-10-CM

## 2012-09-10 DIAGNOSIS — D649 Anemia, unspecified: Secondary | ICD-10-CM

## 2012-09-10 DIAGNOSIS — N186 End stage renal disease: Secondary | ICD-10-CM

## 2012-09-10 DIAGNOSIS — I5022 Chronic systolic (congestive) heart failure: Secondary | ICD-10-CM

## 2012-09-10 DIAGNOSIS — F411 Generalized anxiety disorder: Secondary | ICD-10-CM

## 2012-09-10 DIAGNOSIS — G4733 Obstructive sleep apnea (adult) (pediatric): Secondary | ICD-10-CM

## 2012-09-10 LAB — RENAL FUNCTION PANEL
CO2: 20 mEq/L (ref 19–32)
Calcium: 8.8 mg/dL (ref 8.4–10.5)
Creatinine, Ser: 3.37 mg/dL — ABNORMAL HIGH (ref 0.50–1.35)
Glucose, Bld: 134 mg/dL — ABNORMAL HIGH (ref 70–99)

## 2012-09-10 LAB — GLUCOSE, CAPILLARY
Glucose-Capillary: 156 mg/dL — ABNORMAL HIGH (ref 70–99)
Glucose-Capillary: 139 mg/dL — ABNORMAL HIGH (ref 70–99)
Glucose-Capillary: 153 mg/dL — ABNORMAL HIGH (ref 70–99)

## 2012-09-10 LAB — BLOOD GAS, ARTERIAL
Bicarbonate: 18.6 mEq/L — ABNORMAL LOW (ref 20.0–24.0)
FIO2: 0.21 %
Patient temperature: 98.6
pCO2 arterial: 30.1 mmHg — ABNORMAL LOW (ref 35.0–45.0)
pH, Arterial: 7.408 (ref 7.350–7.450)
pO2, Arterial: 58.1 mmHg — ABNORMAL LOW (ref 80.0–100.0)

## 2012-09-10 LAB — HEPATITIS B SURFACE ANTIGEN: Hepatitis B Surface Ag: NEGATIVE

## 2012-09-10 LAB — HEPATITIS B SURFACE ANTIBODY,QUALITATIVE: Hep B S Ab: NEGATIVE

## 2012-09-10 MED ORDER — PANTOPRAZOLE SODIUM 40 MG PO TBEC
DELAYED_RELEASE_TABLET | ORAL | Status: AC
  Filled 2012-09-10: qty 1

## 2012-09-10 MED ORDER — LORAZEPAM 0.5 MG PO TABS
0.5000 mg | ORAL_TABLET | Freq: Two times a day (BID) | ORAL | Status: DC | PRN
  Administered 2012-09-11 – 2012-09-25 (×8): 0.5 mg via ORAL
  Filled 2012-09-10 (×9): qty 1

## 2012-09-10 MED ORDER — SODIUM CHLORIDE 0.9 % IV SOLN
250.0000 mL | INTRAVENOUS | Status: DC | PRN
  Administered 2012-09-22: 13 mL/h via INTRAVENOUS

## 2012-09-10 MED ORDER — SODIUM CHLORIDE 0.9 % IJ SOLN
3.0000 mL | Freq: Two times a day (BID) | INTRAMUSCULAR | Status: DC
  Administered 2012-09-10 – 2012-09-26 (×13): 3 mL via INTRAVENOUS

## 2012-09-10 MED ORDER — FEBUXOSTAT 40 MG PO TABS
80.0000 mg | ORAL_TABLET | Freq: Every day | ORAL | Status: DC
  Administered 2012-09-10 – 2012-09-27 (×18): 80 mg via ORAL
  Filled 2012-09-10 (×19): qty 2

## 2012-09-10 MED ORDER — TRAMADOL HCL 50 MG PO TABS
100.0000 mg | ORAL_TABLET | Freq: Two times a day (BID) | ORAL | Status: DC
  Administered 2012-09-10 – 2012-09-12 (×6): 100 mg via ORAL
  Filled 2012-09-10 (×5): qty 2

## 2012-09-10 MED ORDER — ACETAMINOPHEN 325 MG PO TABS
650.0000 mg | ORAL_TABLET | Freq: Four times a day (QID) | ORAL | Status: DC | PRN
  Administered 2012-09-17 – 2012-09-26 (×14): 650 mg via ORAL
  Filled 2012-09-10 (×15): qty 2

## 2012-09-10 MED ORDER — SODIUM CHLORIDE 0.9 % IJ SOLN
3.0000 mL | INTRAMUSCULAR | Status: DC | PRN
  Administered 2012-09-10 (×2): 3 mL via INTRAVENOUS

## 2012-09-10 MED ORDER — TRAMADOL HCL 50 MG PO TABS
ORAL_TABLET | ORAL | Status: AC
  Filled 2012-09-10: qty 2

## 2012-09-10 MED ORDER — DOCUSATE SODIUM 100 MG PO CAPS
100.0000 mg | ORAL_CAPSULE | Freq: Two times a day (BID) | ORAL | Status: DC | PRN
  Filled 2012-09-10: qty 1

## 2012-09-10 MED ORDER — DICLOFENAC SODIUM 1 % TD GEL
2.0000 g | Freq: Four times a day (QID) | TRANSDERMAL | Status: DC | PRN
  Administered 2012-09-23: 2 g via TOPICAL
  Filled 2012-09-10 (×2): qty 100

## 2012-09-10 NOTE — Plan of Care (Signed)
Problem: Food- and Nutrition-Related Knowledge Deficit (NB-1.1) Goal: Nutrition education Formal process to instruct or train a patient/client in a skill or to impart knowledge to help patients/clients voluntarily manage or modify food choices and eating behavior to maintain or improve health. Outcome: Completed/Met Date Met:  09/10/12 RD notified by RN in rounds that pt needed fluid restriction education. Pt admitted with weight gain related to fluid status in the setting of HD.  Pt provided with fluid restriction hand out. Pt very sleepy at time of RD visit, falling alseep almost immediately after being woken several times. Left hand out at bedside. Pt friend in room notified of location of hand out for pt and to let RN know if pt wanted additional education.  Body mass index is 66.27 kg/(m^2). obesity class 3, extreme Diet: Renal, no meals documented at this time. Expect good intake.   Chart reviewed, no additional nutrition interventions warranted at this time. Please consult as needed.   Clarene Duke RD, LDN Pager [phone] After Hours pager [phone]

## 2012-09-10 NOTE — Progress Notes (Signed)
I have seen and examined this patient and agree with the plan of care. Plan dialysis in AM. Hypotension could be limiting would recommend stopping narcotics and antihypertensives. We shall start midodrine if still low in AM  [facility] W 09/10/2012, 2:34 PM

## 2012-09-10 NOTE — Progress Notes (Signed)
Pt placed on telemetry box #12, CCMD notified of admission Victorino December).

## 2012-09-10 NOTE — Progress Notes (Signed)
[facility] Progress NOTE    Date: 09/10/2012                  Patient Name:  Adam [name]  MRN: [id number]  DOB: [birth year]  Age / Sex: 47 y.o., male         PCP: Lars Masson, MD                 Service Requesting Consult: IMTS                 Reason for Consult: Fluid overload secondary to ESRD            History of Present Illness: Patient is a 47 y.o. 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 He presented to the Internal Medicine Clinic with weight gain of 50 lbs during the last month (epic review shows weight 316-->350 lbs), increased shortness of breath, edema not responsive to recent increase in diuretics  (i.e Toresmide).  He recently had a right AVF placed on 08/31/12.  Medications: Outpatient medications: Prescriptions prior to admission  Medication Sig Dispense Refill  . acetaminophen (TYLENOL) 325 MG tablet Take 650 mg by mouth every 6 (six) hours as needed for pain.      Marland Kitchen colchicine 0.6 MG tablet Take 0.6 mg by mouth See admin instructions. Takes every three days.      . diclofenac sodium (VOLTAREN) 1 % GEL Apply 2 g topically 4 (four) times daily.       Marland Kitchen docusate sodium (COLACE) 100 MG capsule Take 100 mg by mouth as needed for constipation.       Marland Kitchen esomeprazole (NEXIUM) 20 MG capsule Take 1 capsule (20 mg total) by mouth daily before breakfast.  30 capsule  11  . Febuxostat (ULORIC) 80 MG TABS Take 1 tablet by mouth daily.      . fluticasone (FLONASE) 50 MCG/ACT nasal spray Place 1 spray into the nose daily as needed for allergies. For allergies      . insulin glargine (LANTUS) 100 UNIT/ML injection Inject 62 Units into the skin at bedtime.      Marland Kitchen loratadine (CLARITIN) 10 MG tablet Take 10 mg by mouth daily.       Marland Kitchen LORazepam (ATIVAN) 0.5 MG tablet Take 0.5 mg by mouth 2 (two) times daily as needed for anxiety.      . metoprolol (LOPRESSOR) 100 MG tablet Take 100 mg by mouth every morning.       . simvastatin (ZOCOR) 40 MG tablet Take 40 mg by mouth every evening.      . traMADol (ULTRAM-ER) 100 MG 24 hr tablet Take 100 mg by mouth every 6 (six) hours as needed for pain.      Marland Kitchen oxyCODONE-acetaminophen (ROXICET) 5-325 MG per tablet Take 1-2 tablets by mouth every 4 (four) hours as needed for pain.  20 tablet  0    Current medications: Current Facility-Administered Medications  Medication Dose Route Frequency Provider Last Rate Last Dose  . 0.9 %  sodium chloride infusion  100 mL Intravenous PRN Lauris Poag, MD      . 0.9 %  sodium chloride infusion  100 mL Intravenous PRN Lauris Poag, MD      . 0.9 %  sodium chloride  infusion  250 mL Intravenous PRN Na Dierdre Searles, MD      . acetaminophen (TYLENOL) tablet 650 mg  650 mg Oral Q6H PRN Linward Headland, MD      . diclofenac sodium (VOLTAREN) 1 % transdermal gel 2 g  2 g Topical QID PRN Linward Headland, MD      . docusate sodium (COLACE) capsule 100 mg  100 mg Oral BID PRN Linward Headland, MD      . febuxostat (ULORIC) tablet 80 mg  80 mg Oral Daily Jonah Blue, DO   80 mg at 09/10/12 1136  . feeding supplement (NEPRO CARB STEADY) liquid 237 mL  237 mL Oral PRN Lauris Poag, MD      . heparin injection 1,000 Units  1,000 Units Dialysis PRN Lauris Poag, MD   4,600 Units at 09/09/12 2330  . heparin injection 5,000 Units  5,000 Units Subcutaneous Q8H Linward Headland, MD   5,000 Units at 09/10/12 [phone]  . insulin aspart (novoLOG) injection 0-9 Units  0-9 Units Subcutaneous TID WC Linward Headland, MD   2 Units at 09/10/12 1211  . insulin glargine (LANTUS) injection 50 Units  50 Units Subcutaneous QHS Linward Headland, MD      . lidocaine (PF) (XYLOCAINE) 1 % injection 5 mL  5 mL Intradermal PRN Lauris Poag, MD      . lidocaine-prilocaine (EMLA) cream 1 application  1  application Topical PRN Lauris Poag, MD      . LORazepam (ATIVAN) tablet 0.5 mg  0.5 mg Oral BID PRN Linward Headland, MD      . metoprolol (LOPRESSOR) tablet 100 mg  100 mg Oral q morning - 10a Linward Headland, MD      . oxyCODONE-acetaminophen (PERCOCET/ROXICET) 5-325 MG per tablet 1-2 tablet  1-2 tablet Oral Q4H PRN Linward Headland, MD   2 tablet at 09/10/12 0340  . pantoprazole (PROTONIX) 40 MG EC tablet           . pantoprazole (PROTONIX) EC tablet 40 mg  40 mg Oral Daily Linward Headland, MD   40 mg at 09/10/12 1136  . pentafluoroprop-tetrafluoroeth (GEBAUERS) aerosol 1 application  1 application Topical PRN Lauris Poag, MD      . simvastatin (ZOCOR) tablet 40 mg  40 mg Oral QPM Linward Headland, MD      . sodium chloride 0.9 % injection 3 mL  3 mL Intravenous Q12H Na Li, MD   3 mL at 09/10/12 1137  . sodium chloride 0.9 % injection 3 mL  3 mL Intravenous PRN Na Li, MD   3 mL at 09/10/12 1137  . traMADol (ULTRAM) 50 MG tablet           . traMADol (ULTRAM) tablet 100 mg  100 mg Oral Q12H Jonah Blue, DO   100 mg at 09/10/12 1135      Allergies: Allergies  Allergen Reactions  . Ace Inhibitors     Acute renal failure with multiple trials      Past Medical History: Past Medical History  Diagnosis Date  . Other and unspecified hyperlipidemia   . Insomnia, unspecified   . Obstructive sleep apnea (adult) (pediatric)   . Allergic rhinitis, cause unspecified   . Nonischemic cardiomyopathy   . Unspecified essential hypertension   . Type II or unspecified type diabetes mellitus without mention of complication, not stated as uncontrolled   . CKD (chronic kidney disease) stage 3,  GFR 30-59 ml/min   . Esophageal reflux   . Morbid obesity   . Dysuria   . Tobacco use disorder   . Dysrhythmia   . Pacemaker   . Antral ulcer   . Renal azotemia   . Arthritis     Gout w/hyperuricemia  . Morbid obesity      Past Surgical History: Past Surgical History  Procedure Laterality Date  . Cardiac  defibrillator placement  2011  . Esophagogastroduodenoscopy  01/03/2011    Procedure: ESOPHAGOGASTRODUODENOSCOPY (EGD);  Surgeon: Vertell Novak., MD;  Location: [facility] ENDOSCOPY;  Service: Endoscopy;  Laterality: N/A;  . Av fistula placement Right 08/31/2012    Procedure: ARTERIOVENOUS (AV) FISTULA CREATION;  Surgeon: Chuck Hint, MD;  Location: [facility] OR;  Service: Vascular;  Laterality: Right;  . Insertion of dialysis catheter Right 08/31/2012    Procedure: INSERTION OF DIALYSIS CATHETER-Right Internal Jugular Placement;  Surgeon: Chuck Hint, MD;  Location: [facility] OR;  Service: Vascular;  Laterality: Right;     Family History: Family History  Problem Relation Age of Onset  . Diabetes Mother   . Microcephaly Father   . Lung cancer Father      Social History: History   Social History  . Marital Status: Single    Spouse Name: N/A    Number of Children: N/A  . Years of Education: N/A   Occupational History  . Not on file.   Social History Main Topics  . Smoking status: Current Every Day Smoker -- 0.50 packs/day for .5 years    Types: Cigarettes  . Smokeless tobacco: Never Used     Comment: started smoking at age 58. Will stop on his own - cutting back now- was smoking a ppd - now only 3 a day  . Alcohol Use: No     Comment: No alcohol x 1 month.  . Drug Use: No  . Sexual Activity: Not on file   Other Topics Concern  . Not on file   Social History Narrative  . No narrative on file     Review of Systems: General: +tired  Cardiac: denies chest pain Pulm: +sob with exertion Abd/GU: +abdominal distention, no difficulty urinating  Ext: +edema lower extremities   Vital Signs: Blood pressure 93/63, pulse 120, temperature 97.6 F (36.4 C), temperature source Oral, resp. rate 20, weight 350 lb 8.5 oz (159 kg), SpO2 95.00%.  Weight trends: Filed Weights   09/09/12 1830 09/09/12 2310  Weight: 363 lb 12.1 oz (165 kg) 350 lb 8.5 oz (159 kg)    Physical  Exam: General: Vital signs reviewed and noted. Well-developed, obese, in no acute distress; drowsy intermittently, appropriate and cooperative throughout examination.  Head: Normocephalic, atraumatic.  Lungs:  Fine crackles and expiratory wheezes   Heart: Tachycardic   Abdomen:  BS normoactive. Soft, obese, non-tender.    Extremities: 1+ edema, no cyanosis, hyperpigmentation and thickening to lower extremities   Neurologic: Intermittently drowsy.   Skin: hyperpigmentation and thickening to lower extremities    Lab results: Basic Metabolic Panel:  Recent Labs Lab 09/03/12 1804 09/09/12 1452 09/09/12 1915  NA 137 138 137  K 4.0 3.4* 3.4*  CL 106 102 102  CO2 17* 19 18*  GLUCOSE 130* 169* 124*  BUN 90* 95* 97*  CREATININE 3.32* 3.68* 3.45*  CALCIUM 9.4 8.9 8.8  PHOS  --   --  4.9*    Liver Function Tests:  Recent Labs Lab 09/09/12 1915  AST 14  ALT 6  ALKPHOS 145*  BILITOT 1.7*  PROT 7.6  ALBUMIN 3.3*    CBC:  Recent Labs Lab 09/03/12 1804 09/09/12 1915  WBC 6.0 6.7  NEUTROABS 3.9  --   HGB 8.8* 8.7*  HCT 28.0* 27.9*  MCV 87.2 85.3  PLT 213 230   CBG:  Recent Labs Lab 09/09/12 1337 09/10/12 0018 09/10/12 1105  GLUCAP 201* 140* 156*     Imaging: Dg Chest Port 1 View  09/10/2012   *RADIOLOGY REPORT*  Clinical Data: Lower extremity swelling.  Weakness and dizziness.  PORTABLE CHEST - 1 VIEW  Comparison: 08/31/2012.  Findings: The pacer wire / AICD is stable.  The dialysis catheter is unchanged.  The heart is enlarged and there is vascular congestion and pulmonary edema.  No definite pleural effusion.  IMPRESSION: Cardiac enlargement and pulmonary edema suggesting CHF.  No definite pleural effusion.   Original Report Authenticated By: Rudie Meyer, M.D.     Assessment & Plan: Pt is a 47 y.o. 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 He presented to the Internal Medicine Clinic with weight gain of 50 lbs during the last month (epic review shows weight 316-->350 lbs), increased shortness of breath, lower extremity edema and abdominal bloating.  He recently had a right AVF placed on 08/31/12.  1. Fluid overload secondary to end stage renal disease  -Renal following -Patient had HD Thursday, planned for Saturday due to hypotension -pending PTH, renal function panel, vitamin D level  -trend daily weights and i/o's   2. Chronic combined systolic and diastolic heart failure s/p AICD (EF 25%) with nonischemic cardiomyopathy -CXR indicates patient has cardiomegaly, CHF and vascular congestion.  Will likely benefit from HD.  -Monitor strict i/o, daily weights  -Continue BB, statin, allergic to ACEI  3. Diabetes 2 uncontrolled (HA1C 9/1 in 07/2012)  -primary team addressing   4. Increased drowsiness  -Patient has history of OSA not currently using cpap this admission.  He also did not sleep well last night and had his first HD session 8/14 which could be cause  -will get stat ABG to look at CO2 levels  5. Hypotension  -Will d/c narcotic medication  6. F/E/N -will need to monitor renal function panel. Stat RFP pending. -renal diet   7. DVT PPX  - heparin sq

## 2012-09-10 NOTE — Progress Notes (Signed)
Respiratory called and made aware for a STAT ABG. Will continue to monitor.

## 2012-09-10 NOTE — Progress Notes (Signed)
Pt noted to have a bleeding area just posterior to sacrum, and excoriation between gluteal folds. Pt states he has been itching there for the past few days, and the other day scratched until bleeding. Given powder and EPC cream to apply as needed, perineal hygiene encouraged. Will attempt to measure when pt returns to bed. Will continue to monitor.

## 2012-09-10 NOTE — H&P (Signed)
INTERNAL MEDICINE TEACHING SERVICE Attending Admission Note  Date: 09/10/2012  Patient name: Adam [name]  Medical record number: [id number]  Date of birth: 12/10/1965    I have seen and evaluated Adam [name] and discussed their care with the Residency Team.   47 yr. Old AAM w/ hx CKD stage 5, hx cardiorenal syndrome, CHF, presented to clinic with a 50 lb weight gain over 1 month.  He has been followed by his nephrologist due to preparations for initiating HD. He was being managed on PO diuretics up to this point. He admitted to increasing SOB and edema that has severely progressed. He is s/p creation of R radial-cephalic AVF on 08/31/12. He has volume overload resistant to diuretic therapy and was initiated on HD. He has a R tunneled HD catheter at this time.   He is improved from a medical standpoint and will undergo HD with UF today and tomorrow once again.  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   He has uncontrolled DM, this will need to be addressed with him.  Adjust lantus and novolog as discussed.   Jonah Blue, DO 8/15/20142:26 PM

## 2012-09-10 NOTE — Consult Note (Deleted)
[facility] CONSULT NOTE    Date: 09/10/2012                  Patient Name:  Adam [name]  MRN: [id number]  DOB: 1965/10/20  Age / Sex: 47 y.o., male         PCP: Lars Masson, MD                 Service Requesting Consult: IMTS                 Reason for Consult: Fluid overload secondary to ESRD            History of Present Illness: Patient is a 47 y.o. 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 He presented to the Internal Medicine Clinic with weight gain of 50 lbs during the last month (epic review shows weight 316-->350 lbs), increased shortness of breath, edema not responsive to recent increase in diuretics  (i.e Toresmide).  He recently had a right AVF placed on 08/31/12.  Medications: Outpatient medications: Prescriptions prior to admission  Medication Sig Dispense Refill  . acetaminophen (TYLENOL) 325 MG tablet Take 650 mg by mouth every 6 (six) hours as needed for pain.      Marland Kitchen colchicine 0.6 MG tablet Take 0.6 mg by mouth See admin instructions. Takes every three days.      . diclofenac sodium (VOLTAREN) 1 % GEL Apply 2 g topically 4 (four) times daily.       Marland Kitchen docusate sodium (COLACE) 100 MG capsule Take 100 mg by mouth as needed for constipation.       Marland Kitchen esomeprazole (NEXIUM) 20 MG capsule Take 1 capsule (20 mg total) by mouth daily before breakfast.  30 capsule  11  . Febuxostat (ULORIC) 80 MG TABS Take 1 tablet by mouth daily.      . fluticasone (FLONASE) 50 MCG/ACT nasal spray Place 1 spray into the nose daily as needed for allergies. For allergies      . insulin glargine (LANTUS) 100 UNIT/ML injection Inject 62 Units into the skin at bedtime.      Marland Kitchen loratadine (CLARITIN) 10 MG tablet Take 10 mg by mouth daily.      Marland Kitchen  LORazepam (ATIVAN) 0.5 MG tablet Take 0.5 mg by mouth 2 (two) times daily as needed for anxiety.      . metoprolol (LOPRESSOR) 100 MG tablet Take 100 mg by mouth every morning.       . simvastatin (ZOCOR) 40 MG tablet Take 40 mg by mouth every evening.      . traMADol (ULTRAM-ER) 100 MG 24 hr tablet Take 100 mg by mouth every 6 (six) hours as needed for pain.      Marland Kitchen oxyCODONE-acetaminophen (ROXICET) 5-325 MG per tablet Take 1-2 tablets by mouth every 4 (four) hours as needed for pain.  20 tablet  0    Current medications: Current Facility-Administered Medications  Medication Dose Route Frequency Provider Last Rate Last Dose  . 0.9 %  sodium chloride infusion  100 mL Intravenous PRN Lauris Poag, MD      . 0.9 %  sodium chloride infusion  100 mL Intravenous PRN Lauris Poag, MD      . 0.9 %  sodium chloride  infusion  250 mL Intravenous PRN Na Dierdre Searles, MD      . acetaminophen (TYLENOL) tablet 650 mg  650 mg Oral Q6H PRN Linward Headland, MD      . diclofenac sodium (VOLTAREN) 1 % transdermal gel 2 g  2 g Topical QID PRN Linward Headland, MD      . docusate sodium (COLACE) capsule 100 mg  100 mg Oral BID PRN Linward Headland, MD      . febuxostat (ULORIC) tablet 80 mg  80 mg Oral Daily Jonah Blue, DO   80 mg at 09/10/12 1136  . feeding supplement (NEPRO CARB STEADY) liquid 237 mL  237 mL Oral PRN Lauris Poag, MD      . heparin injection 1,000 Units  1,000 Units Dialysis PRN Lauris Poag, MD   4,600 Units at 09/09/12 2330  . heparin injection 5,000 Units  5,000 Units Subcutaneous Q8H Linward Headland, MD   5,000 Units at 09/10/12 [phone]  . insulin aspart (novoLOG) injection 0-9 Units  0-9 Units Subcutaneous TID WC Linward Headland, MD   2 Units at 09/10/12 1211  . insulin glargine (LANTUS) injection 50 Units  50 Units Subcutaneous QHS Linward Headland, MD      . lidocaine (PF) (XYLOCAINE) 1 % injection 5 mL  5 mL Intradermal PRN Lauris Poag, MD      . lidocaine-prilocaine (EMLA) cream 1 application  1  application Topical PRN Lauris Poag, MD      . LORazepam (ATIVAN) tablet 0.5 mg  0.5 mg Oral BID PRN Linward Headland, MD      . metoprolol (LOPRESSOR) tablet 100 mg  100 mg Oral q morning - 10a Linward Headland, MD      . oxyCODONE-acetaminophen (PERCOCET/ROXICET) 5-325 MG per tablet 1-2 tablet  1-2 tablet Oral Q4H PRN Linward Headland, MD   2 tablet at 09/10/12 0340  . pantoprazole (PROTONIX) 40 MG EC tablet           . pantoprazole (PROTONIX) EC tablet 40 mg  40 mg Oral Daily Linward Headland, MD   40 mg at 09/10/12 1136  . pentafluoroprop-tetrafluoroeth (GEBAUERS) aerosol 1 application  1 application Topical PRN Lauris Poag, MD      . simvastatin (ZOCOR) tablet 40 mg  40 mg Oral QPM Linward Headland, MD      . sodium chloride 0.9 % injection 3 mL  3 mL Intravenous Q12H Na Li, MD   3 mL at 09/10/12 1137  . sodium chloride 0.9 % injection 3 mL  3 mL Intravenous PRN Na Li, MD   3 mL at 09/10/12 1137  . traMADol (ULTRAM) 50 MG tablet           . traMADol (ULTRAM) tablet 100 mg  100 mg Oral Q12H Jonah Blue, DO   100 mg at 09/10/12 1135      Allergies: Allergies  Allergen Reactions  . Ace Inhibitors     Acute renal failure with multiple trials      Past Medical History: Past Medical History  Diagnosis Date  . Other and unspecified hyperlipidemia   . Insomnia, unspecified   . Obstructive sleep apnea (adult) (pediatric)   . Allergic rhinitis, cause unspecified   . Nonischemic cardiomyopathy   . Unspecified essential hypertension   . Type II or unspecified type diabetes mellitus without mention of complication, not stated as uncontrolled   . CKD (chronic kidney disease) stage 3,  GFR 30-59 ml/min   . Esophageal reflux   . Morbid obesity   . Dysuria   . Tobacco use disorder   . Dysrhythmia   . Pacemaker   . Antral ulcer   . Renal azotemia   . Arthritis     Gout w/hyperuricemia  . Morbid obesity      Past Surgical History: Past Surgical History  Procedure Laterality Date  . Cardiac  defibrillator placement  2011  . Esophagogastroduodenoscopy  01/03/2011    Procedure: ESOPHAGOGASTRODUODENOSCOPY (EGD);  Surgeon: Vertell Novak., MD;  Location: [facility] ENDOSCOPY;  Service: Endoscopy;  Laterality: N/A;  . Av fistula placement Right 08/31/2012    Procedure: ARTERIOVENOUS (AV) FISTULA CREATION;  Surgeon: Chuck Hint, MD;  Location: [facility] OR;  Service: Vascular;  Laterality: Right;  . Insertion of dialysis catheter Right 08/31/2012    Procedure: INSERTION OF DIALYSIS CATHETER-Right Internal Jugular Placement;  Surgeon: Chuck Hint, MD;  Location: [facility] OR;  Service: Vascular;  Laterality: Right;     Family History: Family History  Problem Relation Age of Onset  . Diabetes Mother   . Microcephaly Father   . Lung cancer Father      Social History: History   Social History  . Marital Status: Single    Spouse Name: N/A    Number of Children: N/A  . Years of Education: N/A   Occupational History  . Not on file.   Social History Main Topics  . Smoking status: Current Every Day Smoker -- 0.50 packs/day for .5 years    Types: Cigarettes  . Smokeless tobacco: Never Used     Comment: started smoking at age 47. Will stop on his own - cutting back now- was smoking a ppd - now only 3 a day  . Alcohol Use: No     Comment: No alcohol x 1 month.  . Drug Use: No  . Sexual Activity: Not on file   Other Topics Concern  . Not on file   Social History Narrative  . No narrative on file     Review of Systems: General: +tired  Cardiac: denies chest pain Pulm: +sob with exertion Abd/GU: +abdominal distention, no difficulty urinating  Ext: +edema lower extremities   Vital Signs: Blood pressure 121/74, pulse 112, temperature 98.8 F (37.1 C), temperature source Oral, resp. rate 20, weight 350 lb 8.5 oz (159 kg), SpO2 97.00%.  Weight trends: Filed Weights   09/09/12 1830 09/09/12 2310  Weight: 363 lb 12.1 oz (165 kg) 350 lb 8.5 oz (159 kg)    Physical  Exam: General: Vital signs reviewed and noted. Well-developed, obese, in no acute distress; drowsy intermittently, appropriate and cooperative throughout examination.  Head: Normocephalic, atraumatic.  Lungs:  Fine crackles and expiratory wheezes   Heart: Tachycardic   Abdomen:  BS normoactive. Soft, obese, non-tender.    Extremities: 1+ edema, no cyanosis, hyperpigmentation and thickening to lower extremities   Neurologic: Intermittently drowsy.   Skin: hyperpigmentation and thickening to lower extremities    Lab results: Basic Metabolic Panel:  Recent Labs Lab 09/03/12 1804 09/09/12 1452 09/09/12 1915  NA 137 138 137  K 4.0 3.4* 3.4*  CL 106 102 102  CO2 17* 19 18*  GLUCOSE 130* 169* 124*  BUN 90* 95* 97*  CREATININE 3.32* 3.68* 3.45*  CALCIUM 9.4 8.9 8.8  PHOS  --   --  4.9*    Liver Function Tests:  Recent Labs Lab 09/09/12 1915  AST 14  ALT 6  ALKPHOS 145*  BILITOT 1.7*  PROT 7.6  ALBUMIN 3.3*    CBC:  Recent Labs Lab 09/03/12 1804 09/09/12 1915  WBC 6.0 6.7  NEUTROABS 3.9  --   HGB 8.8* 8.7*  HCT 28.0* 27.9*  MCV 87.2 85.3  PLT 213 230   CBG:  Recent Labs Lab 09/09/12 1337 09/10/12 0018 09/10/12 1105  GLUCAP 201* 140* 156*     Imaging: Dg Chest Port 1 View  09/10/2012   *RADIOLOGY REPORT*  Clinical Data: Lower extremity swelling.  Weakness and dizziness.  PORTABLE CHEST - 1 VIEW  Comparison: 08/31/2012.  Findings: The pacer wire / AICD is stable.  The dialysis catheter is unchanged.  The heart is enlarged and there is vascular congestion and pulmonary edema.  No definite pleural effusion.  IMPRESSION: Cardiac enlargement and pulmonary edema suggesting CHF.  No definite pleural effusion.   Original Report Authenticated By: Rudie Meyer, M.D.     Assessment & Plan: Pt is a 47 y.o. 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 He presented to the Internal Medicine Clinic with weight gain of 50 lbs during the last month (epic review shows weight 316-->350 lbs), increased shortness of breath, lower extremity edema and abdominal bloating.  He recently had a right AVF placed on 08/31/12.  1. Fluid overload secondary to end stage renal disease  -Renal following -Patient had HD Thursday, planned for Friday and Saturday  -pending PTH, renal function panel, vitamin D level  -trend daily weights and i/o's   2. Chronic combined systolic and diastolic heart failure s/p AICD (EF 25%) with nonischemic cardiomyopathy -CXR indicates patient has cardiomegaly, CHF and vascular congestion.  Will likely benefit from HD.  -Monitor strict i/o, daily weights  -Continue BB, statin, allergic to ACEI  3. Diabetes 2 uncontrolled (HA1C 9/1 in 07/2012)  -primary team addressing   4. Increased drowsiness  -Patient has history of OSA not currently using cpap this admission.  He also did not sleep well last night and had his first HD session 8/14 which could be cause  -will get stat ABG to look at CO2 levels  5. F/E/N -will need to monitor renal function panel. Stat RFP pending. -renal diet   6. DVT PPX  - heparin sq

## 2012-09-10 NOTE — Progress Notes (Signed)
Subjective:  Pt seen and examined. Reports he could not sleep well last night and did not wear with CPAP. Dyspnea and lower extremity edema better today. No nausea, vomiting, or abdominal pain. Normal BM and urinating without difficulty. Going to dialysis today in the afternoon.    Objective: Vital signs in last 24 hours: Filed Vitals:   09/09/12 2300 09/09/12 2310 09/09/12 2320 09/10/12 0636  BP: 93/73 98/71 97/65  121/74  Pulse: 110 109 110 112  Temp:   98.2 F (36.8 C) 98.8 F (37.1 C)  TempSrc:   Oral Oral  Resp: 18 20 18 20   Weight:  159 kg (350 lb 8.5 oz)    SpO2:  96% 97% 97%   Weight change:   Intake/Output Summary (Last 24 hours) at 09/10/12 1610 Last data filed at 09/10/12 0800  Gross per 24 hour  Intake    720 ml  Output   4251 ml  Net  -3531 ml   Constitutional: Obese, NAD Head: Normocephalic Eyes: PERRL Neck: Supple Cardiovascular: RRR, S1 normal, S2 normal, pitting +3 edema b/l from thighs to abdomen.  Pulmonary/Chest: Air entry equal bilaterally. Crackles and wheezing  present in bases  Abdominal: Soft. Non-tender, non-distended, bowel sounds are normal, no masses, organomegaly, or guarding present.  Musculoskeletal: No synovitis, effusions, or erythema of bilateral upper and lower  appendicular joints  Neurological: A&O x3 Skin: Chronic venous stasis changes of bilateral lower legs, w/ oozing lesions on his legs 2/2 severe edema.   Lab Results: Basic Metabolic Panel:  Recent Labs Lab 09/09/12 1452 09/09/12 1915  NA 138 137  K 3.4* 3.4*  CL 102 102  CO2 19 18*  GLUCOSE 169* 124*  BUN 95* 97*  CREATININE 3.68* 3.45*  CALCIUM 8.9 8.8  PHOS  --  4.9*   Liver Function Tests:  Recent Labs Lab 09/09/12 1915  AST 14  ALT 6  ALKPHOS 145*  BILITOT 1.7*  PROT 7.6  ALBUMIN 3.3*   No results found for this basename: LIPASE, AMYLASE,  in the last 168 hours No results found for this basename: AMMONIA,  in the last 168 hours CBC:  Recent  Labs Lab 09/03/12 1804 09/09/12 1915  WBC 6.0 6.7  NEUTROABS 3.9  --   HGB 8.8* 8.7*  HCT 28.0* 27.9*  MCV 87.2 85.3  PLT 213 230   Cardiac Enzymes: No results found for this basename: CKTOTAL, CKMB, CKMBINDEX, TROPONINI,  in the last 168 hours BNP: No results found for this basename: PROBNP,  in the last 168 hours D-Dimer: No results found for this basename: DDIMER,  in the last 168 hours CBG:  Recent Labs Lab 09/09/12 1337 09/10/12 0018  GLUCAP 201* 140*   Hemoglobin A1C: No results found for this basename: HGBA1C,  in the last 168 hours Fasting Lipid Panel: No results found for this basename: CHOL, HDL, LDLCALC, TRIG, CHOLHDL, LDLDIRECT,  in the last 168 hours Thyroid Function Tests: No results found for this basename: TSH, T4TOTAL, FREET4, T3FREE, THYROIDAB,  in the last 168 hours Coagulation: No results found for this basename: LABPROT, INR,  in the last 168 hours Anemia Panel: No results found for this basename: VITAMINB12, FOLATE, FERRITIN, TIBC, IRON, RETICCTPCT,  in the last 168 hours Urine Drug Screen: Drugs of Abuse     Component Value Date/Time   LABOPIA NEG 03/17/2006 1455   COCAINSCRNUR NEG 03/17/2006 1455   LABBENZ NEG 03/17/2006 1455   AMPHETMU NEG 03/17/2006 1455    Alcohol Level: No results  found for this basename: ETH,  in the last 168 hours Urinalysis: No results found for this basename: COLORURINE, APPERANCEUR, LABSPEC, PHURINE, GLUCOSEU, HGBUR, BILIRUBINUR, KETONESUR, PROTEINUR, UROBILINOGEN, NITRITE, LEUKOCYTESUR,  in the last 168 hours Misc. Labs:   Micro Results: No results found for this or any previous visit (from the past 240 hour(s)). Studies/Results: No results found. Medications: I have reviewed the patient's current medications. Scheduled Meds: . febuxostat  80 mg Oral Daily  . heparin  5,000 Units Subcutaneous Q8H  . insulin aspart  0-9 Units Subcutaneous TID WC  . insulin glargine  50 Units Subcutaneous QHS  . metoprolol  100  mg Oral q morning - 10a  . pantoprazole  40 mg Oral Daily  . simvastatin  40 mg Oral QPM  . traMADol  100 mg Oral Q12H   Continuous Infusions:  PRN Meds:.sodium chloride, sodium chloride, acetaminophen, diclofenac sodium, docusate sodium, feeding supplement (NEPRO CARB STEADY), heparin, lidocaine (PF), lidocaine-prilocaine, LORazepam, oxyCODONE-acetaminophen, pentafluoroprop-tetrafluoroeth Assessment/Plan: Principal Problem:   CKD (chronic kidney disease) stage 4, GFR 15-29 ml/min Active Problems:   Type II or unspecified type diabetes mellitus with renal manifestations, uncontrolled(250.42)   DYSLIPIDEMIA   Hypopotassemia   Morbid obesity   ANXIETY   TOBACCO ABUSE   OBSTRUCTIVE SLEEP APNEA   HYPERTENSION   CARDIOMYOPATHY   Chronic systolic heart failure   GERD   Gastric ulcer   Leg swelling   Knee pain   ICD (implantable cardioverter-defibrillator), single, in situ   Normocytic anemia   Open wound of foot   End stage renal disease 1. ESRD with severe volume overload necessitating emergent HD, with right arm AV cimino fistula and perm cath last week  -HD 8/14 -  Tolerated well, 4L off, catheter running high pressure, episode of hypotension and tachycardia - asymptomatic  -HD again today and Saturday -Cr 3.45 on 8/14 - Continue renal diet, 1.2L fluid restriction per nephrology recs    - Normocytic Anemia - H/H 8.7/27.9, consider erythropoetin injection, target Hct 33-36%  -Monitor Potassium - 3.4 on 8/14, most likely due to diuretic use, correct with HD    -Hyperphosphatemia  -  4.9 on 8/14, repeat phosp AM, phosphate restriction 1g/d , consider phosphate binder therapy with meals - Corrected calcium - 9.4, obtain vit D level - Obtain Mg levels    - Hep B S Ag negative, Hep core Ag pending    2. DM c/b dyslipidemia  - Lantus 50 U q pm  - SSI for meals  - Continue home statin   3. CHF with EF of 25% - Nonischemic cardiomyopathy with AICD (2011)  - Metoprolol 100 mg q am    -Obtain CXR (8/15) --> cardiomegaly and pulmonary edema suggesting CHF, no pleural effusion   -Monitor on telemetry  4. OSA  - CPAP   5. Anxiety  - Ativan 0.5 mg prn   6. Knee Pain  - Tylenold prn for mild pain  - Tramadol q 6 hr prn for mod apin  - Oxycodone-APAP for severe pain  - Voltaren Gel   7. Gout  - Febuxostat   8. GERD  - Continue home PPI   9. Prophylaxis  - sq heparin      Dispo: Disposition is deferred at this time, awaiting improvement of current medical problems.  Anticipated discharge in approximately  day(s).   The patient does have a current PCP Lars Masson, MD) and does need an [facility] hospital follow-up appointment after discharge.  The patient does have transportation  limitations that hinder transportation to clinic appointments.  .Services Needed at time of discharge: Y = Yes, Blank = No PT:   OT:   RN:   Equipment:   Other:     LOS: 1 day   Otis Brace, MD 09/10/2012, 8:32 AM

## 2012-09-10 NOTE — Progress Notes (Signed)
Utilization review completed. Tayten Heber, RN, BSN. 

## 2012-09-11 ENCOUNTER — Encounter (HOSPITAL_COMMUNITY): Payer: Self-pay | Admitting: *Deleted

## 2012-09-11 LAB — GLUCOSE, CAPILLARY

## 2012-09-11 LAB — CBC
MCH: 27.1 pg (ref 26.0–34.0)
MCHC: 31.7 g/dL (ref 30.0–36.0)
MCV: 85.6 fL (ref 78.0–100.0)
Platelets: 189 10*3/uL (ref 150–400)
RBC: 3.06 MIL/uL — ABNORMAL LOW (ref 4.22–5.81)
RDW: 16.9 % — ABNORMAL HIGH (ref 11.5–15.5)

## 2012-09-11 LAB — RENAL FUNCTION PANEL
Albumin: 3.3 g/dL — ABNORMAL LOW (ref 3.5–5.2)
BUN: 83 mg/dL — ABNORMAL HIGH (ref 6–23)
Calcium: 9 mg/dL (ref 8.4–10.5)
Creatinine, Ser: 3.58 mg/dL — ABNORMAL HIGH (ref 0.50–1.35)
GFR calc non Af Amer: 19 mL/min — ABNORMAL LOW (ref >90)
Phosphorus: 4.8 mg/dL — ABNORMAL HIGH (ref 2.3–4.6)

## 2012-09-11 MED ORDER — LIDOCAINE HCL (PF) 1 % IJ SOLN: 5.0000 mL | INTRAMUSCULAR | Status: DC | PRN

## 2012-09-11 MED ORDER — HEPARIN SODIUM (PORCINE) 1000 UNIT/ML DIALYSIS: 1000.0000 [IU] | INTRAMUSCULAR | Status: DC | PRN

## 2012-09-11 MED ORDER — PENTAFLUOROPROP-TETRAFLUOROETH EX AERO: 1.0000 "application " | INHALATION_SPRAY | CUTANEOUS | Status: DC | PRN

## 2012-09-11 MED ORDER — LIDOCAINE-PRILOCAINE 2.5-2.5 % EX CREA: 1.0000 "application " | TOPICAL_CREAM | CUTANEOUS | Status: DC | PRN

## 2012-09-11 MED ORDER — SODIUM CHLORIDE 0.9 % IV SOLN: 100.0000 mL | INTRAVENOUS | Status: DC | PRN

## 2012-09-11 MED ORDER — ONDANSETRON HCL 4 MG/2ML IJ SOLN: 4.0000 mg | INTRAMUSCULAR | Status: DC | PRN

## 2012-09-11 MED ORDER — NEPRO/CARBSTEADY PO LIQD: 237.0000 mL | ORAL | Status: DC | PRN

## 2012-09-11 MED ORDER — ALTEPLASE 2 MG IJ SOLR: 2.0000 mg | Freq: Once | INTRAMUSCULAR | Status: DC | PRN

## 2012-09-11 NOTE — Progress Notes (Signed)
I saw and evaluated the patient.  I personally confirmed the key portions of the history and exam documented by Dr. Yetta Barre and I reviewed pertinent patient test results.  The assessment, diagnosis, and plan were formulated together and I agree with the documentation in the resident's note.  Pt is appropriate for admission.

## 2012-09-11 NOTE — Progress Notes (Signed)
RT placed patient on cpap via full face mask. Patient is on room air. Patient is tolerating cpap well at this time.

## 2012-09-11 NOTE — Progress Notes (Signed)
Subjective:  Pt seen and examined in AM while in hemodialysis. Pt was nauseous last night and this AM coughing with green color emesis. He had BM yesterday that was normal. He reports he is drowsy since dialysis began, but improved today. No dyspnea or CP, reports some improvement of swelling.     Objective: Vital signs in last 24 hours: Filed Vitals:   09/10/12 2103 09/11/12 0030 09/11/12 0544 09/11/12 0702  BP: 102/73  113/70   Pulse: 87 86 100   Temp: 97.7 F (36.5 C)  97.9 F (36.6 C) 98.2 F (36.8 C)  TempSrc: Oral  Oral Oral  Resp: 20 16 18    Height: 6\' 1"  (1.854 m)     Weight: 349 lb 8 oz (158.532 kg)     SpO2: 95% 96% 95%    Weight change: -14 lb 4.1 oz (-6.468 kg)  Intake/Output Summary (Last 24 hours) at 09/11/12 8469 Last data filed at 09/10/12 2105  Gross per 24 hour  Intake   1080 ml  Output    501 ml  Net    579 ml   Constitutional: Obese, NAD Head: Normocephalic Eyes: PERRL Neck: Supple Cardiovascular: RRR, S1 normal, S2 normal, pitting +3 edema b/l from thighs to abdomen.  Pulmonary/Chest: Air entry equal bilaterally. Diffuse wheezing, crackles in bases  Abdominal: Soft. Non-tender, non-distended, bowel sounds are normal, no masses, organomegaly, or guarding present.  Musculoskeletal: No synovitis, effusions, or erythema of bilateral upper and lower  appendicular joints  Neurological: A&O x3 Skin: Chronic venous stasis changes of bilateral lower legs, w/ oozing lesions on his legs 2/2 severe edema.   Lab Results: Basic Metabolic Panel:  Recent Labs Lab 09/09/12 1915 09/10/12 1339  NA 137 137  K 3.4* 3.5  CL 102 102  CO2 18* 20  GLUCOSE 124* 134*  BUN 97* 80*  CREATININE 3.45* 3.37*  CALCIUM 8.8 8.8  PHOS 4.9* 4.4   Liver Function Tests:  Recent Labs Lab 09/09/12 1915 09/10/12 1339  AST 14  --   ALT 6  --   ALKPHOS 145*  --   BILITOT 1.7*  --   PROT 7.6  --   ALBUMIN 3.3* 3.2*   No results found for this basename: LIPASE,  AMYLASE,  in the last 168 hours No results found for this basename: AMMONIA,  in the last 168 hours CBC:  Recent Labs Lab 09/09/12 1915  WBC 6.7  HGB 8.7*  HCT 27.9*  MCV 85.3  PLT 230   Cardiac Enzymes: No results found for this basename: CKTOTAL, CKMB, CKMBINDEX, TROPONINI,  in the last 168 hours BNP: No results found for this basename: PROBNP,  in the last 168 hours D-Dimer: No results found for this basename: DDIMER,  in the last 168 hours CBG:  Recent Labs Lab 09/09/12 1337 09/10/12 0018 09/10/12 0730 09/10/12 1105 09/10/12 1610 09/10/12 2100  GLUCAP 201* 140* 156* 156* 139* 153*   Hemoglobin A1C: No results found for this basename: HGBA1C,  in the last 168 hours Fasting Lipid Panel: No results found for this basename: CHOL, HDL, LDLCALC, TRIG, CHOLHDL, LDLDIRECT,  in the last 168 hours Thyroid Function Tests: No results found for this basename: TSH, T4TOTAL, FREET4, T3FREE, THYROIDAB,  in the last 168 hours Coagulation: No results found for this basename: LABPROT, INR,  in the last 168 hours Anemia Panel: No results found for this basename: VITAMINB12, FOLATE, FERRITIN, TIBC, IRON, RETICCTPCT,  in the last 168 hours Urine Drug Screen: Drugs of Abuse  Component Value Date/Time   LABOPIA NEG 03/17/2006 1455   COCAINSCRNUR NEG 03/17/2006 1455   LABBENZ NEG 03/17/2006 1455   AMPHETMU NEG 03/17/2006 1455    Alcohol Level: No results found for this basename: ETH,  in the last 168 hours Urinalysis: No results found for this basename: COLORURINE, APPERANCEUR, LABSPEC, PHURINE, GLUCOSEU, HGBUR, BILIRUBINUR, KETONESUR, PROTEINUR, UROBILINOGEN, NITRITE, LEUKOCYTESUR,  in the last 168 hours Misc. Labs:   Micro Results: No results found for this or any previous visit (from the past 240 hour(s)). Studies/Results: Dg Chest Port 1 View  09/10/2012   *RADIOLOGY REPORT*  Clinical Data: Lower extremity swelling.  Weakness and dizziness.  PORTABLE CHEST - 1 VIEW   Comparison: 08/31/2012.  Findings: The pacer wire / AICD is stable.  The dialysis catheter is unchanged.  The heart is enlarged and there is vascular congestion and pulmonary edema.  No definite pleural effusion.  IMPRESSION: Cardiac enlargement and pulmonary edema suggesting CHF.  No definite pleural effusion.   Original Report Authenticated By: Rudie Meyer, M.D.   Medications: I have reviewed the patient's current medications. Scheduled Meds: . febuxostat  80 mg Oral Daily  . heparin  5,000 Units Subcutaneous Q8H  . insulin aspart  0-9 Units Subcutaneous TID WC  . insulin glargine  50 Units Subcutaneous QHS  . metoprolol  100 mg Oral q morning - 10a  . pantoprazole  40 mg Oral Daily  . simvastatin  40 mg Oral QPM  . sodium chloride  3 mL Intravenous Q12H  . traMADol  100 mg Oral Q12H   Continuous Infusions:  PRN Meds:.sodium chloride, sodium chloride, sodium chloride, sodium chloride, sodium chloride, acetaminophen, alteplase, diclofenac sodium, docusate sodium, feeding supplement (NEPRO CARB STEADY), feeding supplement (NEPRO CARB STEADY), heparin, heparin, lidocaine (PF), lidocaine (PF), lidocaine-prilocaine, lidocaine-prilocaine, LORazepam, pentafluoroprop-tetrafluoroeth, pentafluoroprop-tetrafluoroeth, sodium chloride Assessment/Plan: Principal Problem:   CKD (chronic kidney disease) stage 4, GFR 15-29 ml/min Active Problems:   Type II or unspecified type diabetes mellitus with renal manifestations, uncontrolled(250.42)   DYSLIPIDEMIA   Hypopotassemia   Morbid obesity   ANXIETY   TOBACCO ABUSE   OBSTRUCTIVE SLEEP APNEA   HYPERTENSION   CARDIOMYOPATHY   Chronic systolic heart failure   GERD   Gastric ulcer   Leg swelling   Knee pain   ICD (implantable cardioverter-defibrillator), single, in situ   Normocytic anemia   Open wound of foot   End stage renal disease   #CKD Stage 4 to ESRD with severe volume overload refractory to diuretic therapy necessitating emergent HD,  with right arm AV cimino fistula and perm cath last week  -HD 8/14 -  Tolerated well, 4L off, catheter running high pressure, episode of hypotension and tachycardia - asymptomatic  -HD today, did not receive yesterday due to hypotension -Cr downtrending  3.45 on 8/14 to 3.37 8/15 - Continue renal diet, 1.2L fluid restriction per nephrology recs    - Normocytic Anemia - H/H 8.7/27.9, consider erythropoetin injection, target Hct 33-36%  -Monitor Potassium - 3.4 on 8/14 to 3.5 on 8/15, most likely due to diuretic use    - Monitor Phosphate -  4.9 on 8/14 to 4.4 on 8/15,  repeat  phosphate restriction 1g/d , consider phosphate binder therapy with meals - Corrected calcium - 9.4,  vit D 25-OH level low 10,  PTH  Pending  - Obtain Mg levels    - Strict I & O's - 0.5L output 8/15, 4.7L since admission - Monitor daily weight   - Hep B S  Ag negative, Hep core Ag pending   #Nausea and Vomiting - started 8/15, 1 episode of green emesis on 8/16 -Zofran as needed for nausea    # Somnolence - pt reports increased sleepiness after starting HD  --ABG on 8/15- hypoxia, hypocapnia, low bicarbonate - D/c narcotics    #DM c/b dyslipidemia  - Lantus 50 U q pm  - SSI for meals  - Continue home statin   # Acute on chronic CHF with EF of 25% - Nonischemic cardiomyopathy with AICD (2011)  - Metoprolol 100 mg q am  -  CXR (8/15) --> cardiomegaly and pulmonary edema suggesting CHF, no pleural effusion   -Monitor on telemetry  #OSA  - CPAP - wore last night    # Anxiety  - Ativan 0.5 mg prn   #Knee Pain  - Tylenold prn for mild pain  - d/c narcotics Tramadol,  Oxycodone due to somnolence and hypotension    - Voltaren Gel   # Gout  - Febuxostat   #GERD  - Continue home PPI   # Prophylaxis  - sq heparin      Dispo: Disposition is deferred at this time, awaiting improvement of current medical problems.  Anticipated discharge in approximately  day(s).   The patient does have a current PCP  Lars Masson, MD) and does need an [facility] hospital follow-up appointment after discharge.  The patient does have transportation limitations that hinder transportation to clinic appointments.  .Services Needed at time of discharge: Y = Yes, Blank = No PT:   OT:   RN:   Equipment:   Other:     LOS: 2 days   Otis Brace, MD 09/11/2012, 7:22 AM

## 2012-09-11 NOTE — Procedures (Signed)
I have seen and examined this patient and agree with the plan of care . Seen on dialysis. Has an dialysis chair at [facility] . BP 126/72 P 88  Edema 1 + chubby legs. AVF maturing cimino right and catheter in chest  [facility] W 09/11/2012, 8:20 AM

## 2012-09-12 ENCOUNTER — Inpatient Hospital Stay (HOSPITAL_COMMUNITY): Payer: Medicaid Other

## 2012-09-12 DIAGNOSIS — M7989 Other specified soft tissue disorders: Secondary | ICD-10-CM

## 2012-09-12 DIAGNOSIS — M109 Gout, unspecified: Secondary | ICD-10-CM

## 2012-09-12 DIAGNOSIS — E876 Hypokalemia: Secondary | ICD-10-CM

## 2012-09-12 LAB — GLUCOSE, CAPILLARY
Glucose-Capillary: 173 mg/dL — ABNORMAL HIGH (ref 70–99)
Glucose-Capillary: 130 mg/dL — ABNORMAL HIGH (ref 70–99)
Glucose-Capillary: 133 mg/dL — ABNORMAL HIGH (ref 70–99)

## 2012-09-12 MED ORDER — TRAMADOL HCL 50 MG PO TABS
50.0000 mg | ORAL_TABLET | Freq: Two times a day (BID) | ORAL | Status: DC | PRN
  Administered 2012-09-12: 50 mg via ORAL
  Filled 2012-09-12: qty 1

## 2012-09-12 MED ORDER — INSULIN GLARGINE 100 UNIT/ML ~~LOC~~ SOLN
15.0000 [IU] | Freq: Every day | SUBCUTANEOUS | Status: DC
  Administered 2012-09-12 – 2012-09-16 (×5): 15 [IU] via SUBCUTANEOUS
  Filled 2012-09-12 (×8): qty 0.15

## 2012-09-12 MED ORDER — WHITE PETROLATUM GEL
Status: AC
Start: 2012-09-12 — End: 2012-09-12
  Administered 2012-09-12: 0.2
  Filled 2012-09-12: qty 5

## 2012-09-12 MED ORDER — DARBEPOETIN ALFA-POLYSORBATE 60 MCG/0.3ML IJ SOLN
60.0000 ug | INTRAMUSCULAR | Status: AC
  Administered 2012-09-13: 60 ug via INTRAVENOUS
  Filled 2012-09-12: qty 0.3

## 2012-09-12 NOTE — Progress Notes (Signed)
[facility] ROUNDING NOTE   Subjective:   Interval History: sitting out of bed feels somewhat better  Objective:  Vital signs in last 24 hours:  Temp:  [98.1 F (36.7 C)-98.6 F (37 C)] 98.6 F (37 C) (08/17 0930) Pulse Rate:  [77-96] 77 (08/17 0930) Resp:  [16-20] 16 (08/17 0930) BP: (91-104)/(52-70) 91/58 mmHg (08/17 0930) SpO2:  [90 %-95 %] 91 % (08/17 0930) Weight:  [157.3 kg (346 lb 12.5 oz)] 157.3 kg (346 lb 12.5 oz) (08/16 2101)  Weight change: 0.668 kg (1 lb 7.6 oz) Filed Weights   09/11/12 0702 09/11/12 0952 09/11/12 2101  Weight: 159.2 kg (350 lb 15.6 oz) 155.5 kg (342 lb 13 oz) 157.3 kg (346 lb 12.5 oz)    Intake/Output: I/O last 3 completed shifts: In: 720 [P.O.:720] Out: 3002 [Other:3002]   Intake/Output this shift:  Total I/O In: 240 [P.O.:240] Out: -   CVS- RRR RS- CTA ABD- BS present soft non-distended large abdomen EXT- 3 +edema and lichenified changes to lower extremities left forefoot ulceration   Basic Metabolic Panel:  Recent Labs Lab 09/09/12 1452 09/09/12 1915 09/10/12 1339 09/11/12 0631  NA 138 137 137 137  K 3.4* 3.4* 3.5 3.6  CL 102 102 102 103  CO2 19 18* 20 20  GLUCOSE 169* 124* 134* 99  BUN 95* 97* 80* 83*  CREATININE 3.68* 3.45* 3.37* 3.58*  CALCIUM 8.9 8.8 8.8 9.0  PHOS  --  4.9* 4.4 4.8*    Liver Function Tests:  Recent Labs Lab 09/09/12 1915 09/10/12 1339 09/11/12 0631  AST 14  --   --   ALT 6  --   --   ALKPHOS 145*  --   --   BILITOT 1.7*  --   --   PROT 7.6  --   --   ALBUMIN 3.3* 3.2* 3.3*   No results found for this basename: LIPASE, AMYLASE,  in the last 168 hours No results found for this basename: AMMONIA,  in the last 168 hours  CBC:  Recent Labs Lab 09/09/12 1915 09/11/12 0631  WBC 6.7 6.9  HGB 8.7* 8.3*  HCT 27.9* 26.2*  MCV 85.3 85.6  PLT 230 189    Cardiac Enzymes: No results found for this basename: CKTOTAL, CKMB, CKMBINDEX, TROPONINI,  in the last 168  hours  BNP: No components found with this basename: POCBNP,   CBG:  Recent Labs Lab 09/11/12 1631 09/11/12 2111 09/12/12 0741 09/12/12 1024 09/12/12 1152  GLUCAP 144* 155* 95 173* 130*    Microbiology: No results found for this or any previous visit.  Coagulation Studies: No results found for this basename: LABPROT, INR,  in the last 72 hours  Urinalysis: No results found for this basename: COLORURINE, APPERANCEUR, LABSPEC, PHURINE, GLUCOSEU, HGBUR, BILIRUBINUR, KETONESUR, PROTEINUR, UROBILINOGEN, NITRITE, LEUKOCYTESUR,  in the last 72 hours    Imaging: No results found.   Medications:     . febuxostat  80 mg Oral Daily  . heparin  5,000 Units Subcutaneous Q8H  . insulin aspart  0-9 Units Subcutaneous TID WC  . insulin glargine  15 Units Subcutaneous QHS  . pantoprazole  40 mg Oral Daily  . simvastatin  40 mg Oral QPM  . sodium chloride  3 mL Intravenous Q12H   sodium chloride, sodium chloride, sodium chloride, acetaminophen, diclofenac sodium, docusate sodium, feeding supplement (NEPRO CARB STEADY), heparin, lidocaine (PF), lidocaine-prilocaine, LORazepam, ondansetron (ZOFRAN) IV, pentafluoroprop-tetrafluoroeth, sodium chloride, traMADol  Assessment/ Plan:   CKD stage 5,  hx cardiorenal syndrome, CHF, presented to clinic with a 50 lb weight gain over 1 month.He is followed by Dr Lowell [facility] and patient being prepared for dialysis therapy. He was being managed on PO diuretics up to this point but was admitted wih increasing SOB and edema.  1. ESRD  New start to dilaysis  Has an appointment slot [facility]  He received dialysis on Saturday 8/16 as his second treatment.  2. Anemia will start aranesp once weekly and check iron studies 3. Bones  There does not appear to be a PTH collected so we shall add one tomorrow 4. HTN/Volume continue fluid removal  We shall plan another dialysis treatment tomorrow with the goal to remove more fluid.  The biggest concerns are regarding transportation to and from treatment as well as an aide at home to help with daily activities.   LOS: 3 Adam [name] W @TODAY @1 :01 PM

## 2012-09-12 NOTE — Consult Note (Signed)
WOC consult Note Reason for Consult:left foot ulcerations Wound type: venous insufficiency and infectious etiologies Pressure Ulcer POA: No Measurement:Left forefoot:  1.5cm x 2cm x 0.2cm moist, pale pink ulcer surrounded by three pinpoint ulcerations (<0.2cm round); total area of involvement measures 4cm x 3cm.  Left great toe with what appears to be an infected paronychia.  Area of involvement measures 1.5cm x 1cm with an undetermined depth due to the presence of a thin layer of dried yellow serum.  Degree of periwound erythema is difficult to determine due to the darker hues of skin tone in this patient. Wound bed:As described above. Drainage (amount, consistency, odor) small amount of serous drainage on old dressing from forefoot ulceration.  None from left great toe (LGT) Periwound:Macerated around left forefoot ulceration. Dressing procedure/placement/frequency:I will provide for moist saline gauze dressings to be used twice daily on both areas today.  Recommend consideration of an I&D of the suspected infected paronychia and subsequent systemic antibiotic coverage.  At this time, I do not recommend compression therapy to the left LE as the ulcerations are at the location of where the boot will end.  Recommend elevation and follow up by the PCP at this time. Post reepithelialization and repair of both areas, patient might be a candidate for compression therapy following ABIs and consideration of other systemic factors. WOC Nursing team will not follow, but will remain available to this nice gentleman, as well as his medical and nursing teams.  Please re-consult if needed. Thanks, Ladona Mow, MSN, RN, GNP, [place], CWON-AP [phone])

## 2012-09-12 NOTE — Evaluation (Addendum)
Physical Therapy Evaluation Patient Details Name: Adam [name] MRN: [id number] DOB: [birth year] Today's Date: 09/12/2012 Time: 3086-5784 PT Time Calculation (min): 17 min  PT Assessment / Plan / Recommendation History of Present Illness  Pt adm to [facility] due to 50lb weight gain in less than a month, pt admits to increased SOB recenty and Lt LE pain/swelling from fluid retention. Pt with unctrolled diabetes and cardiomyopathy.    Clinical Impression  Patient adm and being treated for CKD stage 5, pt presented to clinic with 50lb  Weight gain in less than a month.  Pt presents with functional limitations due to the deficits listed below (see PT Problem List).  Patient will benefit from skilled PT to increase their independence and safety with mobility to allow discharge to the venue listed below. Pt demo SOB and increased fatigue with activity. Pt O2 destat to 85% on RA with amb.Pt lives alone but will have girlfriend living with him as needed upon D/C.     PT Assessment  Patient needs continued PT services    Follow Up Recommendations  Home health PT;Supervision - Intermittent;Supervision for mobility/OOB    Does the patient have the potential to tolerate intense rehabilitation      Barriers to Discharge        Equipment Recommendations  None recommended by PT    Recommendations for Other Services     Frequency Min 3X/week    Precautions / Restrictions Restrictions Weight Bearing Restrictions: No   Pertinent Vitals/Pain 3/10 in Lt LE       Mobility  Bed Mobility Bed Mobility: Supine to Sit Supine to Sit: 6: Modified independent (Device/Increase time);HOB elevated;With rails Details for Bed Mobility Assistance: no physical (A) needed ; pt had HOB elevated and used  handrails to elevate trunk  Transfers Transfers: Sit to Stand;Stand to Sit Sit to Stand: 4: Min guard;From bed;With upper extremity assist Stand to Sit: 4: Min guard;To chair/3-in-1;With armrests Details for  Transfer Assistance: cues for hand placement and safety; pt attempts to pull up on RW for sit to stand  Ambulation/Gait Ambulation/Gait Assistance: 5: Supervision Ambulation Distance (Feet): 50 Feet (with rest breaks) Assistive device: Rolling walker Ambulation/Gait Assistance Details: pt required 2 standing rest breaks due to fatigue and SOB; pt amb on RA and O2 destat to 85%; supervision for safety and cues for RW management; pt amb with Lt LE ER and outside of RW due to pain  Gait Pattern: Step-to pattern;Decreased stance time - left;Decreased step length - right;Wide base of support;Trunk flexed Gait velocity: decreased due to pain  Stairs: No Wheelchair Mobility Wheelchair Mobility: No    Exercises General Exercises - Lower Extremity Ankle Circles/Pumps: AROM;10 reps;Supine   PT Diagnosis: Generalized weakness;Abnormality of gait  PT Problem List: Cardiopulmonary status limiting activity;Pain;Decreased knowledge of use of DME;Decreased strength;Decreased activity tolerance;Decreased mobility;Decreased balance PT Treatment Interventions: DME instruction;Gait training;Functional mobility training;Therapeutic activities;Therapeutic exercise;Balance training;Neuromuscular re-education;Patient/family education     PT Goals(Current goals can be found in the care plan section) Acute Rehab PT Goals Patient Stated Goal: to get this leg better  PT Goal Formulation: With patient Time For Goal Achievement: 09/19/12 Potential to Achieve Goals: Good  Visit Information  Last PT Received On: 09/12/12 Assistance Needed: +1 History of Present Illness: Pt adm to [facility] due to 50lb weight gain in less than a month, pt admits to increased SOB recenty and Lt LE pain/swelling from fluid retention. Pt with unctrolled diabetes and cardiomyopathy.  Prior Functioning  Home Living Family/patient expects to be discharged to:: Private residence Living Arrangements: Alone Available Help at Discharge:  Family;Friend(s);Available 24 hours/day (girlfriend is available to stay with him as needed ) Type of Home: Apartment Home Access: Level entry Home Layout: One level Home Equipment: Walker - 2 wheels;Cane - single point Additional Comments: pt has neighbor bring groceries  Prior Function Level of Independence: Independent with assistive device(s) Communication Communication: No difficulties Dominant Hand: Right    Cognition  Cognition Arousal/Alertness: Awake/alert Behavior During Therapy: WFL for tasks assessed/performed Overall Cognitive Status: Within Functional Limits for tasks assessed    Extremity/Trunk Assessment Upper Extremity Assessment Upper Extremity Assessment: Overall WFL for tasks assessed Lower Extremity Assessment Lower Extremity Assessment: LLE deficits/detail LLE Deficits / Details: unable to fully assess Lt LE due to pain LLE: Unable to fully assess due to pain LLE Sensation: history of peripheral neuropathy Cervical / Trunk Assessment Cervical / Trunk Assessment: Normal   Balance Balance Balance Assessed: Yes Static Sitting Balance Static Sitting - Balance Support: No upper extremity supported;Feet supported Static Sitting - Level of Assistance: 5: Stand by assistance  End of Session PT - End of Session Equipment Utilized During Treatment: Gait belt Activity Tolerance: Patient limited by fatigue Patient left: in chair;with call bell/phone within reach;with nursing/sitter in room Nurse Communication: Mobility status  GP     Donell Sievert , [place] [phone]  09/12/2012, 2:48 PM

## 2012-09-12 NOTE — Progress Notes (Signed)
Subjective: Pt seen and examined. No acute events overnight. Pt denies dyspnea, CP, cough, and abdominal pain. Had 1 episode of emesis this morning Normal bowel movement and urination. Pt with left foot ulcer that has been ongoing for past month. Bilateral LE swelling is improving slowly.    Objective: Vital signs in last 24 hours: Filed Vitals:   09/11/12 1400 09/11/12 1730 09/11/12 2101 09/12/12 0500  BP: 98/52 104/65 101/70 95/61  Pulse: 91 92 96 81  Temp: 98.2 F (36.8 C) 98.2 F (36.8 C) 98.6 F (37 C) 98.1 F (36.7 C)  TempSrc: Oral Oral Oral Oral  Resp: 18 16 18 20   Height:   6\' 1"  (1.854 m)   Weight:   157.3 kg (346 lb 12.5 oz)   SpO2: 95% 90% 93% 95%   Weight change: 0.668 kg (1 lb 7.6 oz)  Intake/Output Summary (Last 24 hours) at 09/12/12 1610 Last data filed at 09/11/12 1700  Gross per 24 hour  Intake    480 ml  Output   3002 ml  Net  -2522 ml   Constitutional: Obese, NAD Head: Normocephalic Eyes: PERRL Neck: Supple Cardiovascular: RRR, S1 normal, S2 normal, pitting +3 edema b/l from thighs to abdomen.  Pulmonary/Chest: Air entry equal bilaterally. Diffuse wheezing, crackles in bases  Abdominal: Soft. Non-tender, non-distended, bowel sounds are normal, no masses, organomegaly, or guarding present.  Musculoskeletal: No synovitis, effusions, or erythema of bilateral upper and lower  appendicular joints  Neurological: A&O x3 Skin: Chronic venous stasis changes of bilateral lower legs, w/ oozing lesion on right leg, left foot forefoot ulceration    Lab Results: Basic Metabolic Panel:  Recent Labs Lab 09/10/12 1339 09/11/12 0631  NA 137 137  K 3.5 3.6  CL 102 103  CO2 20 20  GLUCOSE 134* 99  BUN 80* 83*  CREATININE 3.37* 3.58*  CALCIUM 8.8 9.0  PHOS 4.4 4.8*   Liver Function Tests:  Recent Labs Lab 09/09/12 1915 09/10/12 1339 09/11/12 0631  AST 14  --   --   ALT 6  --   --   ALKPHOS 145*  --   --   BILITOT 1.7*  --   --   PROT 7.6  --    --   ALBUMIN 3.3* 3.2* 3.3*   No results found for this basename: LIPASE, AMYLASE,  in the last 168 hours No results found for this basename: AMMONIA,  in the last 168 hours CBC:  Recent Labs Lab 09/09/12 1915 09/11/12 0631  WBC 6.7 6.9  HGB 8.7* 8.3*  HCT 27.9* 26.2*  MCV 85.3 85.6  PLT 230 189   Cardiac Enzymes: No results found for this basename: CKTOTAL, CKMB, CKMBINDEX, TROPONINI,  in the last 168 hours BNP: No results found for this basename: PROBNP,  in the last 168 hours D-Dimer: No results found for this basename: DDIMER,  in the last 168 hours CBG:  Recent Labs Lab 09/10/12 1105 09/10/12 1610 09/10/12 2100 09/11/12 1138 09/11/12 1631 09/11/12 2111  GLUCAP 156* 139* 153* 141* 144* 155*   Hemoglobin A1C: No results found for this basename: HGBA1C,  in the last 168 hours Fasting Lipid Panel: No results found for this basename: CHOL, HDL, LDLCALC, TRIG, CHOLHDL, LDLDIRECT,  in the last 168 hours Thyroid Function Tests: No results found for this basename: TSH, T4TOTAL, FREET4, T3FREE, THYROIDAB,  in the last 168 hours Coagulation: No results found for this basename: LABPROT, INR,  in the last 168 hours Anemia Panel:  No results found for this basename: VITAMINB12, FOLATE, FERRITIN, TIBC, IRON, RETICCTPCT,  in the last 168 hours Urine Drug Screen: Drugs of Abuse     Component Value Date/Time   LABOPIA NEG 03/17/2006 1455   COCAINSCRNUR NEG 03/17/2006 1455   LABBENZ NEG 03/17/2006 1455   AMPHETMU NEG 03/17/2006 1455    Alcohol Level: No results found for this basename: ETH,  in the last 168 hours Urinalysis: No results found for this basename: COLORURINE, APPERANCEUR, LABSPEC, PHURINE, GLUCOSEU, HGBUR, BILIRUBINUR, KETONESUR, PROTEINUR, UROBILINOGEN, NITRITE, LEUKOCYTESUR,  in the last 168 hours Misc. Labs:   Micro Results: No results found for this or any previous visit (from the past 240 hour(s)). Studies/Results: Dg Chest Port 1 View  09/10/2012    *RADIOLOGY REPORT*  Clinical Data: Lower extremity swelling.  Weakness and dizziness.  PORTABLE CHEST - 1 VIEW  Comparison: 08/31/2012.  Findings: The pacer wire / AICD is stable.  The dialysis catheter is unchanged.  The heart is enlarged and there is vascular congestion and pulmonary edema.  No definite pleural effusion.  IMPRESSION: Cardiac enlargement and pulmonary edema suggesting CHF.  No definite pleural effusion.   Original Report Authenticated By: Rudie Meyer, M.D.   Medications: I have reviewed the patient's current medications. Scheduled Meds: . febuxostat  80 mg Oral Daily  . heparin  5,000 Units Subcutaneous Q8H  . insulin aspart  0-9 Units Subcutaneous TID WC  . insulin glargine  50 Units Subcutaneous QHS  . metoprolol  100 mg Oral q morning - 10a  . pantoprazole  40 mg Oral Daily  . simvastatin  40 mg Oral QPM  . sodium chloride  3 mL Intravenous Q12H  . traMADol  100 mg Oral Q12H   Continuous Infusions:  PRN Meds:.sodium chloride, sodium chloride, sodium chloride, acetaminophen, diclofenac sodium, docusate sodium, feeding supplement (NEPRO CARB STEADY), heparin, lidocaine (PF), lidocaine-prilocaine, LORazepam, ondansetron (ZOFRAN) IV, pentafluoroprop-tetrafluoroeth, sodium chloride Assessment/Plan: Principal Problem:   CKD (chronic kidney disease) stage 4, GFR 15-29 ml/min Active Problems:   Type II or unspecified type diabetes mellitus with renal manifestations, uncontrolled(250.42)   DYSLIPIDEMIA   Hypopotassemia   Morbid obesity   ANXIETY   TOBACCO ABUSE   OBSTRUCTIVE SLEEP APNEA   HYPERTENSION   CARDIOMYOPATHY   Chronic systolic heart failure   GERD   Gastric ulcer   Leg swelling   Knee pain   ICD (implantable cardioverter-defibrillator), single, in situ   Normocytic anemia   Open wound of foot   End stage renal disease   #CKD Stage 4 to ESRD with severe volume overload refractory to diuretic therapy necessitating emergent HD, with right arm AV cimino  fistula and perm cath last week  -HD 8/14 -  tolerated well, 4L off, catheter running high pressure, episode of hypotension and tachycardia - asymptomatic -HD 8/16 - tolerated well, 3L off  -HD tomm -Cr downtrending  3.45 on 8/14 to 3.37 8/15 -Continue renal diet, 1.2L fluid restriction per nephrology recs    -Normocytic Anemia - H/H 8.7/27.9, to receive weekly erythropoetin injection, target Hct 33-36%, iron studies pending  -Monitor Potassium - 3.4 on 8/14 now WNL    -Monitor Phosphate - hyperphosphatemia -  4.8 on  8/16,  repeat  phosphate restriction 1g/d , consider phosphate binder therapy with meals -Corrected calcium - WNL,  vit D 25-OH deficiency (low 10),  PTH pending  -Obtain Mg levels    -Strict I & O's - 3.0L output , 4.7L since admission -Monitor daily weight  -  equal to dry weight -Hep B S Ag negative, Hep core Ag negative  -Case Manager for transportation to and from HD and home aid   #Nausea and Vomiting - no abdominal pain, normal BM, likely due to HD and electrolyte/fluid shift;  started 8/15, 1 episode of green emesis on 8/16 and again 8/17 after eating   -Zofran as needed for nausea  Left forefoot wound - venous vs arterial vs diabetic pressure ulcer  -Per wound consultation - Left forefoot: 1.5cm x 2cm x 0.2cm moist, pale pink ulcer surrounded by three pinpoint ulcerations (<0.2cm round); total area of involvement measures 4cm x 3cm. Left great toe with what appears to be an infected paronychia, recommend I & D of paronychia and antibiotics -Obtain Xray left foot  - no osteomyelitis  -Obtain ABI  -Ortho consult in AM    # Somnolence - improving. Pt reports increased sleepiness after starting HD  --ABG on 8/15- hypoxia, hypocapnia, low bicarbonate - D/c narcotics    #DM c/b dyslipidemia - last HbA1c 9.1 on 07/27/2012  - Lantus 15 U q pm  - SSI for meals  - Continue home statin  - Lipid panel 03/2012 - low HDL (26)  # Acute on chronic CHF with EF of 25% -  Nonischemic cardiomyopathy with AICD (2011)  - HOLD  Metoprolol 100 mg q am due to low blood pressures - CXR (8/15) --> cardiomegaly and pulmonary edema suggesting CHF, no pleural effusion   - Monitor on telemetry  #OSA  - CPAP at  night    # Anxiety  - Ativan 0.5 mg prn   #Knee Pain  - Tylenold prn for mild pain  - Tramadol,  Oxycodone AS NEEDED due to somnolence and hypotension    - Voltaren Gel   # Gout  - Febuxostat   #GERD  - Continue home PPI   # Prophylaxis  - sq heparin      Dispo: Disposition is deferred at this time, awaiting improvement of current medical problems.  Anticipated discharge in approximately  day(s).   The patient does have a current PCP Lars Masson, MD) and does need an [facility] hospital follow-up appointment after discharge.  The patient does have transportation limitations that hinder transportation to clinic appointments.  .Services Needed at time of discharge: Y = Yes, Blank = No PT:   OT:   RN:   Equipment:   Other:     LOS: 3 days   Otis Brace, MD 09/12/2012, 7:07 AM

## 2012-09-13 DIAGNOSIS — L97909 Non-pressure chronic ulcer of unspecified part of unspecified lower leg with unspecified severity: Secondary | ICD-10-CM

## 2012-09-13 DIAGNOSIS — I10 Essential (primary) hypertension: Secondary | ICD-10-CM

## 2012-09-13 DIAGNOSIS — N184 Chronic kidney disease, stage 4 (severe): Secondary | ICD-10-CM

## 2012-09-13 DIAGNOSIS — I5023 Acute on chronic systolic (congestive) heart failure: Secondary | ICD-10-CM

## 2012-09-13 LAB — CBC
HCT: 26.8 % — ABNORMAL LOW (ref 39.0–52.0)
Hemoglobin: 8.2 g/dL — ABNORMAL LOW (ref 13.0–17.0)
MCH: 26.5 pg (ref 26.0–34.0)
MCHC: 30.6 g/dL (ref 30.0–36.0)
MCV: 86.5 fL (ref 78.0–100.0)

## 2012-09-13 LAB — GLUCOSE, CAPILLARY: Glucose-Capillary: 176 mg/dL — ABNORMAL HIGH (ref 70–99)

## 2012-09-13 LAB — RENAL FUNCTION PANEL
Albumin: 3.1 g/dL — ABNORMAL LOW (ref 3.5–5.2)
Chloride: 99 mEq/L (ref 96–112)
Creatinine, Ser: 3.97 mg/dL — ABNORMAL HIGH (ref 0.50–1.35)
GFR calc non Af Amer: 16 mL/min — ABNORMAL LOW (ref >90)
Potassium: 3.7 mEq/L (ref 3.5–5.1)

## 2012-09-13 LAB — MAGNESIUM: Magnesium: 2.1 mg/dL (ref 1.5–2.5)

## 2012-09-13 LAB — TROPONIN I
Troponin I: <0.3 ng/mL (ref <0.30)
Troponin I: <0.3 ng/mL (ref <0.30)

## 2012-09-13 MED ORDER — PENTAFLUOROPROP-TETRAFLUOROETH EX AERO: 1.0000 "application " | INHALATION_SPRAY | CUTANEOUS | Status: DC | PRN

## 2012-09-13 MED ORDER — DIGOXIN 0.25 MG/ML IJ SOLN
0.2500 mg | INTRAMUSCULAR | Status: DC
  Administered 2012-09-13: 0.25 mg via INTRAVENOUS
  Filled 2012-09-13 (×5): qty 1

## 2012-09-13 MED ORDER — NEPRO/CARBSTEADY PO LIQD: 237.0000 mL | ORAL | Status: DC | PRN

## 2012-09-13 MED ORDER — SODIUM CHLORIDE 0.9 % IV SOLN: 100.0000 mL | INTRAVENOUS | Status: DC | PRN

## 2012-09-13 MED ORDER — LIDOCAINE-PRILOCAINE 2.5-2.5 % EX CREA: 1.0000 "application " | TOPICAL_CREAM | CUTANEOUS | Status: DC | PRN

## 2012-09-13 MED ORDER — DIGOXIN 0.0625 MG HALF TABLET: 0.0625 mg | ORAL_TABLET | Freq: Every day | ORAL | Status: DC

## 2012-09-13 MED ORDER — HEPARIN SODIUM (PORCINE) 1000 UNIT/ML DIALYSIS: 1000.0000 [IU] | INTRAMUSCULAR | Status: DC | PRN

## 2012-09-13 MED ORDER — DARBEPOETIN ALFA-POLYSORBATE 60 MCG/0.3ML IJ SOLN
INTRAMUSCULAR | Status: AC
  Filled 2012-09-13: qty 0.3

## 2012-09-13 MED ORDER — CEFAZOLIN SODIUM 1-5 GM-% IV SOLN
1.0000 g | Freq: Two times a day (BID) | INTRAVENOUS | Status: DC
  Administered 2012-09-13 – 2012-09-14 (×2): 1 g via INTRAVENOUS
  Filled 2012-09-13 (×3): qty 50

## 2012-09-13 MED ORDER — HEPARIN BOLUS VIA INFUSION
4000.0000 [IU] | Freq: Once | INTRAVENOUS | Status: AC
  Administered 2012-09-13: 4000 [IU] via INTRAVENOUS
  Filled 2012-09-13: qty 4000

## 2012-09-13 MED ORDER — HEPARIN (PORCINE) IN NACL 100-0.45 UNIT/ML-% IJ SOLN
2000.0000 [IU]/h | INTRAMUSCULAR | Status: DC
Start: 2012-09-13 — End: 2012-09-15
  Administered 2012-09-13: 1600 [IU]/h via INTRAVENOUS
  Administered 2012-09-14 (×2): 2100 [IU]/h via INTRAVENOUS
  Administered 2012-09-15: 2000 [IU]/h via INTRAVENOUS
  Filled 2012-09-13 (×6): qty 250

## 2012-09-13 MED ORDER — ALTEPLASE 2 MG IJ SOLR: 2.0000 mg | Freq: Once | INTRAMUSCULAR | Status: DC | PRN

## 2012-09-13 MED ORDER — METOPROLOL TARTRATE 12.5 MG HALF TABLET
12.5000 mg | ORAL_TABLET | Freq: Four times a day (QID) | ORAL | Status: DC
  Administered 2012-09-13 – 2012-09-15 (×7): 12.5 mg via ORAL
  Filled 2012-09-13 (×13): qty 1

## 2012-09-13 MED ORDER — LIDOCAINE HCL (PF) 1 % IJ SOLN: 5.0000 mL | INTRAMUSCULAR | Status: DC | PRN

## 2012-09-13 MED ORDER — METOPROLOL TARTRATE 12.5 MG HALF TABLET
12.5000 mg | ORAL_TABLET | Freq: Two times a day (BID) | ORAL | Status: DC
  Administered 2012-09-13: 12.5 mg via ORAL
  Filled 2012-09-13 (×3): qty 1

## 2012-09-13 NOTE — Progress Notes (Signed)
Orthostatic VS in epic,Dr. Johna Roles paged and notified.Incoming RN aware. [place]ass[place]acht Joselita,RN

## 2012-09-13 NOTE — Progress Notes (Signed)
Dr Arlean Hopping notified of pts increased HR, orders given to defer dialysis to 2nd rounds after MD sees him.

## 2012-09-13 NOTE — Progress Notes (Signed)
MEDICATION RELATED CONSULT NOTE - INITIAL   Pharmacy Consult for Digoxin Indication: Rate control, tachycardia  Allergies  Allergen Reactions  . Ace Inhibitors     Acute renal failure with multiple trials    Patient Measurements: Height: 6\' 1"  (185.4 cm) Weight: 351 lb 6.6 oz (159.4 kg) IBW/kg (Calculated) : 79.9  Vital Signs: Temp: 98.2 F (36.8 C) (08/18 1300) Temp src: Oral (08/18 1300) BP: 123/61 mmHg (08/18 1600) Pulse Rate: 85 (08/18 1600) Intake/Output from previous day: 08/17 0701 - 08/18 0700 In: 600 [P.O.:600] Out: 300 [Urine:300] Labs:  Recent Labs  09/11/12 0631 09/13/12 1200  WBC 6.9 6.4  HGB 8.3* 8.2*  HCT 26.2* 26.8*  PLT 189 180  CREATININE 3.58* 3.97*  MG  --  2.1  PHOS 4.8* 4.1  ALBUMIN 3.3* 3.1*   Estimated Creatinine Clearance: 36 ml/min (by C-G formula based on Cr of 3.97).   Microbiology: No results found for this or any previous visit (from the past 720 hour(s)).  Medical History: Past Medical History  Diagnosis Date  . Other and unspecified hyperlipidemia   . Insomnia, unspecified   . Obstructive sleep apnea (adult) (pediatric)   . Allergic rhinitis, cause unspecified   . Nonischemic cardiomyopathy   . Unspecified essential hypertension   . Type II or unspecified type diabetes mellitus without mention of complication, not stated as uncontrolled   . CKD (chronic kidney disease) stage 3, GFR 30-59 ml/min   . Esophageal reflux   . Morbid obesity   . Dysuria   . Tobacco use disorder   . Dysrhythmia   . Pacemaker   . Antral ulcer   . Renal azotemia   . Arthritis     Gout w/hyperuricemia  . Morbid obesity     Assessment: 47 y/o HD patient having problems with tachycardia/rate control. Recommendations per cardiology/Dr. Jacinto Halim is Digoxin + Low-dose metoprolol. Pt has ESRD on HD so will be very cautious with Digoxin dosing. After speaking will Dr. Jacinto Halim, will also give a load.   Goal of Therapy:  Rate control  Plan:   -Digoxin IV 0.25mg  q4h x 3 -After the load, start low-dose digoxin at 0.0625mg  PO daily -Given K 3.7, will give a small dose of K (20 meq PO x 1), being cognizant of HD status -Further adjustments of Digoxin per Cardiology/Primary Team -Monitor HR, BP, K  -Pharmacy will sign off  Thank you for allowing me to take part in this patient's care,  Abran Duke, PharmD Clinical Pharmacist Phone: [phone] Pager: [phone] 09/13/2012 4:13 PM

## 2012-09-13 NOTE — Progress Notes (Signed)
Patient refused to have bed alarm on,pt will call for assist before getting OOB.Call bell within reach. Arvin Abello Joselita,RN

## 2012-09-13 NOTE — Progress Notes (Signed)
Patient's HR sustaining in the 130's,patient denies any c/o.Dr. Johna Roles notified,order to do orthostatic VS.Will continue to monitor. Powell Halbert Joselita,RN

## 2012-09-13 NOTE — Progress Notes (Signed)
  Date: 09/13/2012  Patient name: Adam [name]  Medical record number: [id number]  Date of birth: Jun 15, 1965   This patient has been seen and the plan of care was discussed with the house staff. Please see their note for complete details. I concur with their findings with the following additions/corrections:  Agree with cardiology input into rate control.  He is currently hemodynamically stable. I examined his foot ulcer on Lateral L foot, appears to have good granulation tissue. He has paronychia, but this is improving on its own. Will involve ortho due to wound care request.   Nephrology following for HD.  Appreciate their involvement.    Jonah Blue, DO 09/13/2012, 2:02 PM

## 2012-09-13 NOTE — Progress Notes (Addendum)
[facility] Progress NOTE    Subjective: Patient having a little palpitations this am.  Denies dizziness or lightheadedness, chest pain, sob.  Feels nauseated with HD and feels tired and sleepy.  RN: HR up to the 140s overnight pages MD and told to get orthostatics  Current medications: Current Facility-Administered Medications  Medication Dose Route Frequency Provider Last Rate Last Dose  . 0.9 %  sodium chloride infusion  100 mL Intravenous PRN Lauris Poag, MD      . 0.9 %  sodium chloride infusion  100 mL Intravenous PRN Lauris Poag, MD      . 0.9 %  sodium chloride infusion  250 mL Intravenous PRN Na Dierdre Searles, MD      . acetaminophen (TYLENOL) tablet 650 mg  650 mg Oral Q6H PRN Jonah Blue, DO      . darbepoetin (ARANESP) injection 60 mcg  60 mcg Intravenous Q Mon-HD Garnetta Buddy, MD      . diclofenac sodium (VOLTAREN) 1 % transdermal gel 2 g  2 g Topical QID PRN Jonah Blue, DO      . docusate sodium (COLACE) capsule 100 mg  100 mg Oral BID PRN Jonah Blue, DO      . febuxostat (ULORIC) tablet 80 mg  80 mg Oral Daily Jonah Blue, DO   80 mg at 09/12/12 0953  . feeding supplement (NEPRO CARB STEADY) liquid 237 mL  237 mL Oral PRN Lauris Poag, MD      . heparin injection 1,000 Units  1,000 Units Dialysis PRN Lauris Poag, MD   4,600 Units at 09/09/12 2330  . heparin injection 5,000 Units  5,000 Units Subcutaneous Q8H Linward Headland, MD   5,000 Units at 09/13/12 0525  . insulin aspart (novoLOG) injection 0-9 Units  0-9 Units Subcutaneous TID WC Linward Headland, MD   1 Units at 09/12/12 1816  . insulin glargine (LANTUS) injection 15 Units  15 Units Subcutaneous QHS Dede Query, MD   15 Units at 09/12/12 2233  . lidocaine (PF) (XYLOCAINE) 1 % injection 5 mL  5 mL Intradermal PRN Lauris Poag, MD      . lidocaine-prilocaine (EMLA) cream 1 application  1 application Topical PRN Lauris Poag, MD      . LORazepam (ATIVAN) tablet 0.5 mg  0.5 mg Oral BID PRN Jonah Blue, DO   0.5 mg at 09/11/12 1334  . ondansetron (ZOFRAN) injection 4 mg  4 mg Intravenous PRN Marjan Rabbani, MD      . pantoprazole (PROTONIX) EC tablet 40 mg  40 mg Oral Daily Linward Headland, MD   40 mg at 09/12/12 0953  . pentafluoroprop-tetrafluoroeth (GEBAUERS) aerosol 1 application  1 application Topical PRN Lauris Poag, MD      . simvastatin (ZOCOR) tablet 40 mg  40 mg Oral QPM Linward Headland, MD   40 mg at 09/12/12 1816  . sodium chloride 0.9 % injection 3 mL  3 mL Intravenous Q12H Na Li, MD   3 mL at 09/12/12 2209  . sodium chloride 0.9 % injection 3 mL  3 mL Intravenous PRN Na Li, MD   3 mL at 09/10/12 1137  . traMADol (ULTRAM) tablet 50 mg  50 mg Oral Q12H PRN Na Li, MD   50 mg at 09/12/12 2233     Vital Signs: Blood pressure 92/60, pulse 99, temperature 98.1 F (36.7 C), temperature source Oral, resp. rate 18, height 6\' 1"  (1.854  m), weight 353 lb (160.12 kg), SpO2 90.00%.  Weight trends: Filed Weights   09/11/12 1610 09/11/12 2101 09/12/12 2157  Weight: 342 lb 13 oz (155.5 kg) 346 lb 12.5 oz (157.3 kg) 353 lb (160.12 kg)    Physical Exam: General: Vital signs reviewed and noted. Well-developed, obese, in no acute distress;  appropriate and cooperative throughout examination.  Head: Normocephalic, atraumatic.  Lungs:  Fine crackles bilaterally   Heart: Tachycardic   Abdomen:  BS normoactive. Soft, obese, non-tender.    Extremities: B/l 1+ edema, no cyanosis, hyperpigmentation and thickening to lower extremities   Neurologic: Alert and oriented x 3   Skin: hyperpigmentation and thickening to lower extremities    Lab results: Basic Metabolic Panel:  Recent Labs Lab 09/09/12 1915 09/10/12 1339 09/11/12 0631  NA 137 137 137  K 3.4* 3.5 3.6  CL 102 102 103  CO2 18* 20 20  GLUCOSE 124* 134* 99  BUN 97* 80* 83*  CREATININE 3.45* 3.37* 3.58*  CALCIUM 8.8 8.8 9.0  PHOS 4.9* 4.4 4.8*    Liver Function Tests:  Recent Labs Lab 09/09/12 1915 09/10/12 1339  09/11/12 0631  AST 14  --   --   ALT 6  --   --   ALKPHOS 145*  --   --   BILITOT 1.7*  --   --   PROT 7.6  --   --   ALBUMIN 3.3* 3.2* 3.3*    CBC:  Recent Labs Lab 09/09/12 1915 09/11/12 0631  WBC 6.7 6.9  HGB 8.7* 8.3*  HCT 27.9* 26.2*  MCV 85.3 85.6  PLT 230 189   CBG:  Recent Labs Lab 09/12/12 0741 09/12/12 1024 09/12/12 1152 09/12/12 1655 09/12/12 2156  GLUCAP 95 173* 130* 130* 133*     Imaging: Dg Foot Complete Left  09/12/2012   *RADIOLOGY REPORT*  Clinical Data: Left toe infection, left dorsal foot ulcer for 1 month, rule out osteomyelitis left big toe  LEFT FOOT - COMPLETE 3+ VIEW  Comparison: None.  Findings: Extensive small vessel calcification.  No fracture dislocation.  No periosteal reaction or cortical destruction.  IMPRESSION: No evidence for osteomyelitis.   Original Report Authenticated By: Esperanza Heir, M.D.     Assessment & Plan: Pt is a 47 y.o. 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 He presented to the [facility] Medicine Clinic with weight gain of 50 lbs during the last month (epic review shows weight 316-->350 lbs), increased shortness of breath, lower extremity edema and abdominal bloating.  He recently had a right AVF placed on 08/31/12.  1. Fluid overload secondary to end stage renal disease  -Renal following -Patient had HD Thursday, Saturday plan for Monday HD pending due to tachycardia. Will do HD today -pending PTH and Fe studies, vitamin D level (10) low -will get HD outpatient at [facility] -renal to dose Aranesp in the future  -trend daily weights and i/o's   2. 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  Will likely benefit from HD.  -Monitor strict i/o, daily weights  -hold BB (consider PM dosing when resume BB), Continue statin, allergic to ACEI -Rec. cardiology consult (Dr. Tenny Craw and Ladona Ridgel  outpatient cardiologist) for medications to keep HR down and BP elevated   3. Diabetes 2 uncontrolled (HA1C 9.1 in 07/2012)  -primary team addressing   4. Hypotension  -Etiology could be related to anemia, medications, heart failure history.  monitor BP still soft  -hold BB  5. F/E/N -RFP pending. -renal diet   6. DVT PPX  - heparin sq

## 2012-09-13 NOTE — Progress Notes (Addendum)
ANTICOAGULATION CONSULT NOTE - Initial Consult  Pharmacy Consult for heparin Indication: atrial fibrillation  Allergies  Allergen Reactions  . Ace Inhibitors     Acute renal failure with multiple trials    Patient Measurements: Height: 6\' 1"  (185.4 cm) Weight: 341 lb 7.9 oz (154.9 kg) IBW/kg (Calculated) : 79.9 Heparin Dosing Weight: 116 kg  Vital Signs: Temp: 99.3 F (37.4 C) (08/18 2047) Temp src: Oral (08/18 2047) BP: 105/64 mmHg (08/18 2047) Pulse Rate: 103 (08/18 2047)  Labs:  Recent Labs  09/11/12 0631 09/13/12 1200 09/13/12 1824  HGB 8.3* 8.2*  --   HCT 26.2* 26.8*  --   PLT 189 180  --   CREATININE 3.58* 3.97*  --   TROPONINI  --  <0.30 <0.30    Estimated Creatinine Clearance: 35.4 ml/min (by C-G formula based on Cr of 3.97).   Medical History: Past Medical History  Diagnosis Date  . Other and unspecified hyperlipidemia   . Insomnia, unspecified   . Obstructive sleep apnea (adult) (pediatric)   . Allergic rhinitis, cause unspecified   . Nonischemic cardiomyopathy   . Unspecified essential hypertension   . Type II or unspecified type diabetes mellitus without mention of complication, not stated as uncontrolled   . CKD (chronic kidney disease) stage 3, GFR 30-59 ml/min   . Esophageal reflux   . Morbid obesity   . Dysuria   . Tobacco use disorder   . Dysrhythmia   . Pacemaker   . Antral ulcer   . Renal azotemia   . Arthritis     Gout w/hyperuricemia  . Morbid obesity     Medications:  Scheduled:  .  ceFAZolin (ANCEF) IV  1 g Intravenous Q12H  . darbepoetin      . febuxostat  80 mg Oral Daily  . insulin aspart  0-9 Units Subcutaneous TID WC  . insulin glargine  15 Units Subcutaneous QHS  . metoprolol tartrate  12.5 mg Oral QID  . pantoprazole  40 mg Oral Daily  . simvastatin  40 mg Oral QPM  . sodium chloride  3 mL Intravenous Q12H    Assessment: 47 yo male with hx ESRD, NICM, EF 25%, and multiple other co-morbidities.  Now developed  tachycardia/new-onset afib.   Has baseline anemia, but platelet count WNL.  No bleeding complications or history of bleeding noted per chart notes.  On weekly Aranesp with HD.  Last SQ heparin dose given this AM at 0525.  OK to bolus.  Goal of Therapy:  Heparin level 0.3-0.7 units/ml Monitor platelets by anticoagulation protocol: Yes   Plan:  1. Heparin 4000 unit IV bolus x 1. 2. Then start heparin gtt at 1600 units/hr. 3. Check heparin level in 8 hrs. 4. Daily heparin level and CBC.   Tad Moore, BCPS  Clinical Pharmacist Pager [phone]  09/13/2012 9:52 PM

## 2012-09-13 NOTE — Progress Notes (Signed)
The Pt has refused CPAP machine for the night. Pt says he wants to wait on the machine for the night. Pt is currently in no distress, resting well. RT will continue to assist as needed.

## 2012-09-13 NOTE — Progress Notes (Addendum)
Subjective:  Pt seen and examined. Pt with tacycardia in 130's early AM with sinus tachycardia on telemetry. Pt with some palpitations but resolved, currently asymptomatic. No CP, dyspnea, cough, diaphoresis, or dizziness. Pt reports nausea without vomiting or abdominal pain.     Objective: Vital signs in last 24 hours: Filed Vitals:   09/12/12 2157 09/12/12 2355 09/13/12 0459 09/13/12 0737  BP: 90/69  92/60 99/70  Pulse: 102 98 99 136  Temp: 98.4 F (36.9 C)  98.1 F (36.7 C)   TempSrc: Oral  Oral   Resp: 18 20 18    Height: 6\' 1"  (1.854 m)     Weight: 160.12 kg (353 lb)     SpO2: 96%  90%    Weight change: 0.92 kg (2 lb 0.4 oz)  Intake/Output Summary (Last 24 hours) at 09/13/12 2130 Last data filed at 09/13/12 0636  Gross per 24 hour  Intake    600 ml  Output    300 ml  Net    300 ml   Constitutional: Obese, NAD Head: Normocephalic Eyes: PERRL Neck: Supple Cardiovascular: RRR, S1 normal, S2 normal, pitting +3 edema b/l from thighs to abdomen.  Pulmonary/Chest: Air entry equal bilaterally. Diffuse wheezing, crackles in bases  Abdominal: Soft. Non-tender, non-distended, bowel sounds are normal, no masses, organomegaly, or guarding present.  Musculoskeletal: No synovitis, effusions, or erythema of bilateral upper and lower  appendicular joints  Neurological: A&O x3 Skin: Chronic venous stasis changes of bilateral lower legs, w/ oozing lesion on right leg, left foot forefoot ulceration wrapped in gauze    Lab Results: Basic Metabolic Panel:  Recent Labs Lab 09/10/12 1339 09/11/12 0631  NA 137 137  K 3.5 3.6  CL 102 103  CO2 20 20  GLUCOSE 134* 99  BUN 80* 83*  CREATININE 3.37* 3.58*  CALCIUM 8.8 9.0  PHOS 4.4 4.8*   Liver Function Tests:  Recent Labs Lab 09/09/12 1915 09/10/12 1339 09/11/12 0631  AST 14  --   --   ALT 6  --   --   ALKPHOS 145*  --   --   BILITOT 1.7*  --   --   PROT 7.6  --   --   ALBUMIN 3.3* 3.2* 3.3*   No results found  for this basename: LIPASE, AMYLASE,  in the last 168 hours No results found for this basename: AMMONIA,  in the last 168 hours CBC:  Recent Labs Lab 09/09/12 1915 09/11/12 0631  WBC 6.7 6.9  HGB 8.7* 8.3*  HCT 27.9* 26.2*  MCV 85.3 85.6  PLT 230 189   Cardiac Enzymes: No results found for this basename: CKTOTAL, CKMB, CKMBINDEX, TROPONINI,  in the last 168 hours BNP: No results found for this basename: PROBNP,  in the last 168 hours D-Dimer: No results found for this basename: DDIMER,  in the last 168 hours CBG:  Recent Labs Lab 09/12/12 0741 09/12/12 1024 09/12/12 1152 09/12/12 1655 09/12/12 2156 09/13/12 0724  GLUCAP 95 173* 130* 130* 133* 89   Hemoglobin A1C: No results found for this basename: HGBA1C,  in the last 168 hours Fasting Lipid Panel: No results found for this basename: CHOL, HDL, LDLCALC, TRIG, CHOLHDL, LDLDIRECT,  in the last 168 hours Thyroid Function Tests: No results found for this basename: TSH, T4TOTAL, FREET4, T3FREE, THYROIDAB,  in the last 168 hours Coagulation: No results found for this basename: LABPROT, INR,  in the last 168 hours Anemia Panel: No results found for this basename:  VITAMINB12, FOLATE, FERRITIN, TIBC, IRON, RETICCTPCT,  in the last 168 hours Urine Drug Screen: Drugs of Abuse     Component Value Date/Time   LABOPIA NEG 03/17/2006 1455   COCAINSCRNUR NEG 03/17/2006 1455   LABBENZ NEG 03/17/2006 1455   AMPHETMU NEG 03/17/2006 1455    Alcohol Level: No results found for this basename: ETH,  in the last 168 hours Urinalysis: No results found for this basename: COLORURINE, APPERANCEUR, LABSPEC, PHURINE, GLUCOSEU, HGBUR, BILIRUBINUR, KETONESUR, PROTEINUR, UROBILINOGEN, NITRITE, LEUKOCYTESUR,  in the last 168 hours Misc. Labs:   Micro Results: No results found for this or any previous visit (from the past 240 hour(s)). Studies/Results: Dg Foot Complete Left  09/12/2012   *RADIOLOGY REPORT*  Clinical Data: Left toe infection,  left dorsal foot ulcer for 1 month, rule out osteomyelitis left big toe  LEFT FOOT - COMPLETE 3+ VIEW  Comparison: None.  Findings: Extensive small vessel calcification.  No fracture dislocation.  No periosteal reaction or cortical destruction.  IMPRESSION: No evidence for osteomyelitis.   Original Report Authenticated By: Esperanza Heir, M.D.   Medications: I have reviewed the patient's current medications. Scheduled Meds: . darbepoetin (ARANESP) injection - DIALYSIS  60 mcg Intravenous Q Mon-HD  . febuxostat  80 mg Oral Daily  . heparin  5,000 Units Subcutaneous Q8H  . insulin aspart  0-9 Units Subcutaneous TID WC  . insulin glargine  15 Units Subcutaneous QHS  . pantoprazole  40 mg Oral Daily  . simvastatin  40 mg Oral QPM  . sodium chloride  3 mL Intravenous Q12H   Continuous Infusions:  PRN Meds:.sodium chloride, sodium chloride, sodium chloride, acetaminophen, diclofenac sodium, docusate sodium, feeding supplement (NEPRO CARB STEADY), heparin, lidocaine (PF), lidocaine-prilocaine, LORazepam, ondansetron (ZOFRAN) IV, pentafluoroprop-tetrafluoroeth, sodium chloride, traMADol Assessment/Plan: Principal Problem:   CKD (chronic kidney disease) stage 4, GFR 15-29 ml/min Active Problems:   Type II or unspecified type diabetes mellitus with renal manifestations, uncontrolled(250.42)   DYSLIPIDEMIA   Hypopotassemia   Morbid obesity   ANXIETY   TOBACCO ABUSE   OBSTRUCTIVE SLEEP APNEA   HYPERTENSION   CARDIOMYOPATHY   Chronic systolic heart failure   GERD   Gastric ulcer   Leg swelling   Knee pain   ICD (implantable cardioverter-defibrillator), single, in situ   Normocytic anemia   Open wound of foot   End stage renal disease   #CKD Stage 4 to ESRD with severe volume overload refractory to diuretic therapy necessitating emergent HD, with right arm AV cimino fistula and perm cath last week  -HD 8/14 -  tolerated well, 4L off, catheter running high pressure, episode of hypotension and  tachycardia - asymptomatic -HD 8/16 - tolerated well, 3L off  -HD today in PM -Cr downtrending  3.45 on 8/14 to 3.37 8/15 -Continue renal diet, 1.2L fluid restriction per nephrology recs    -Normocytic Anemia - H/H 8.7/27.9, to receive weekly erythropoetin injection, target Hct 33-36%, iron studies pending  -Monitor Potassium - 3.4 on 8/14 now WNL    -Monitor Phosphate - hyperphosphatemia -  4.8 on  8/16,  repeat  phosphate restriction 1g/d , consider phosphate binder therapy with meals -Corrected calcium - WNL,  vit D 25-OH deficiency (low 10),  PTH pending  -Strict I & O's - 8.0L since admission -Monitor daily weight  - 12lb,  22lb since admission -Hep B S Ag negative, Hep core Ag negative  -Case Manager Consult - HH aide services for assistance with meals and light house work is not available  except with Personal Care Service thru Medicaid, and as this pt has Medicaid he can begin this process once d/c from the hospital.   #Tachycardia with hypotension - asymptomatic, stable,  HR 130s, SBP in 90s  - most likely due to d/c metoprolol 1 day ago  -Stat 12-lead EKG - 1st degree AV block- changed from previous EKG - no intervention necessary for 1st degree heart block -Stat BMP and Mg levels - K & Mg - WNL  -Obtain orthostatic labs - negative  -Bolus as needed if SBP<90 -Obtain cardiology consult - Spoke with Dr. Jacinto Halim over phone, will see patient, recommend to start low dose metoprolol 12.5mg  BID and digoxin per pharmacy.   #Nausea and Vomiting - nausea w/o vomiting or  abdominal pain, normal BM, likely due to HD and electrolyte/fluid shift;  started 8/15, 1 episode of green emesis on 8/16 and again 8/17 after eating   -Zofran as needed for nausea  Left forefoot wound with paronychia infection of left great toe  - venous vs arterial vs diabetic pressure ulcer  -Per wound consultation - Left forefoot: 1.5cm x 2cm x 0.2cm moist, pale pink ulcer surrounded by three pinpoint ulcerations (<0.2cm  round); total area of involvement measures 4cm x 3cm. Left great toe with what appears to be an infected paronychia, recommend I & D of paronychia and antibiotics -Obtain Xray left foot  - no osteomyelitis  -Obtain ABI - R 1.41 L 1.39 - suggesting calcified vessels, waveforms of b/l LE WNL -Ortho consult - spoke with Dr. Eulah Pont over phone and will see patient, recommended starting IV Ancef for 7 days per pharmacy, who suggested 1g Q8hr for infection    # Somnolence - improving. Pt reports increased sleepiness after starting HD  --ABG on 8/15- hypoxia, hypocapnia, low bicarbonate - D/c narcotics    #DM c/b dyslipidemia - last HbA1c 9.1 on 07/27/2012  -CBG WNL today - Lantus 15 U q pm  - SSI for meals  - Continue home statin  - Lipid panel 03/2012 - low HDL (26)  # Acute on chronic CHF with EF of 25% - Nonischemic cardiomyopathy with AICD (2011)  -Obtain cardiology consult - Spoke with Dr. Jacinto Halim over phone, will see patient, recommend to start low dose metprolol 12.5mg  BID and digoxin per pharmac - CXR (8/15) --> cardiomegaly and pulmonary edema suggesting CHF, no pleural effusion   - Monitor on telemetry  #OSA  - CPAP at  night    # Anxiety  - Ativan 0.5 mg prn   #Knee Pain  - Tylenold prn for mild pain  - Tramadol,  Oxycodone AS NEEDED due to somnolence and hypotension    - Voltaren Gel   # Gout  - Febuxostat   #GERD  - Continue home PPI   # Prophylaxis  - sq heparin      Dispo: Disposition is deferred at this time, awaiting improvement of current medical problems.  Anticipated discharge in approximately  day(s).   The patient does have a current PCP Lars Masson, MD) and does need an [facility] hospital follow-up appointment after discharge.  The patient does have transportation limitations that hinder transportation to clinic appointments.  .Services Needed at time of discharge: Y = Yes, Blank = No PT:   OT:   RN:   Equipment:   Other:     LOS: 4 days   Otis Brace, MD 09/13/2012, 7:38 AM

## 2012-09-13 NOTE — Care Management Note (Signed)
   CARE MANAGEMENT NOTE 09/13/2012  Patient:  Adam [name], Adam [name]   Account Number:  [id number]  Date Initiated:  09/13/2012  Documentation initiated by:  Makynzee Tigges  Subjective/Objective Assessment:   CM referral for [facility] aide.     Action/Plan:   Noted pt has Medicaid and may qualify for Personal Care Services, however application must be completed post d/c and by pt PCP. Discussed process with pt and will provide application to be given to pt at time od d/c.   Anticipated DC Date:  09/16/2012   Anticipated DC Plan:  HOME/SELF CARE         Choice offered to / List presented to:             Status of service:  In process, will continue to follow Medicare Important Message given?   (If response is "NO", the following Medicare IM given date fields will be blank) Date Medicare IM given:   Date Additional Medicare IM given:    Discharge Disposition:  HOME/SELF CARE  Per UR Regulation:    If discussed at Long Length of Stay Meetings, dates discussed:    Comments:  09/13/2012  [facility] aide services for assistance with meals and light house work is not available except with Personal Care Service thru Medicaid, and as this pt has Medicaid he can begin this process once d/c from the hospital. Johny Shock RN MPH Case Manager [phone]

## 2012-09-13 NOTE — Progress Notes (Signed)
PT Cancellation Note  Patient Details Name: Adam [name] MRN: [id number] DOB: 08-08-1965   Cancelled Treatment:    Reason Eval/Treat Not Completed: Patient at procedure or test/unavailable Pt in HD   [facility] E 09/13/2012, 1:24 PM Zenovia Jarred, PT, DPT 09/13/2012 Pager: [phone]

## 2012-09-13 NOTE — Consult Note (Signed)
ORTHOPAEDIC CONSULTATION  REQUESTING PHYSICIAN: Jonah Blue, DO  Chief Complaint: Left foot cellulitis and pressure wound with first toe cellulitis  HPI: Adam [name] is a 47 y.o. male who complains of  Pressure sore and cellulitis to the LLE in the setting of acute admission for dialysis and DM. Pain has improved on Abx  Past Medical History  Diagnosis Date  . Other and unspecified hyperlipidemia   . Insomnia, unspecified   . Obstructive sleep apnea (adult) (pediatric)   . Allergic rhinitis, cause unspecified   . Nonischemic cardiomyopathy   . Unspecified essential hypertension   . Type II or unspecified type diabetes mellitus without mention of complication, not stated as uncontrolled   . CKD (chronic kidney disease) stage 3, GFR 30-59 ml/min   . Esophageal reflux   . Morbid obesity   . Dysuria   . Tobacco use disorder   . Dysrhythmia   . Pacemaker   . Antral ulcer   . Renal azotemia   . Arthritis     Gout w/hyperuricemia  . Morbid obesity    Past Surgical History  Procedure Laterality Date  . Cardiac defibrillator placement  2011  . Esophagogastroduodenoscopy  01/03/2011    Procedure: ESOPHAGOGASTRODUODENOSCOPY (EGD);  Surgeon: Vertell Novak., MD;  Location: [facility] ENDOSCOPY;  Service: Endoscopy;  Laterality: N/A;  . Av fistula placement Right 08/31/2012    Procedure: ARTERIOVENOUS (AV) FISTULA CREATION;  Surgeon: Chuck Hint, MD;  Location: [facility] OR;  Service: Vascular;  Laterality: Right;  . Insertion of dialysis catheter Right 08/31/2012    Procedure: INSERTION OF DIALYSIS CATHETER-Right Internal Jugular Placement;  Surgeon: Chuck Hint, MD;  Location: [facility] OR;  Service: Vascular;  Laterality: Right;   History   Social History  . Marital Status: Single    Spouse Name: N/A    Number of Children: N/A  . Years of Education: N/A   Social History Main Topics  . Smoking status: Current Every Day Smoker -- 0.50 packs/day for .5 years    Types:  Cigarettes  . Smokeless tobacco: Never Used     Comment: started smoking at age 1. Will stop on his own - cutting back now- was smoking a ppd - now only 3 a day  . Alcohol Use: No     Comment: No alcohol x 1 month.  . Drug Use: No  . Sexual Activity: None   Other Topics Concern  . None   Social History Narrative  . None   Family History  Problem Relation Age of Onset  . Diabetes Mother   . Microcephaly Father   . Lung cancer Father    Allergies  Allergen Reactions  . Ace Inhibitors     Acute renal failure with multiple trials   Prior to Admission medications   Medication Sig Start Date End Date Taking? Authorizing Provider  acetaminophen (TYLENOL) 325 MG tablet Take 650 mg by mouth every 6 (six) hours as needed for pain.   Yes Historical Provider, MD  colchicine 0.6 MG tablet Take 0.6 mg by mouth See admin instructions. Takes every three days. 04/26/12 04/26/13 Yes Bronson Curb, MD  diclofenac sodium (VOLTAREN) 1 % GEL Apply 2 g topically 4 (four) times daily.    Yes Historical Provider, MD  docusate sodium (COLACE) 100 MG capsule Take 100 mg by mouth as needed for constipation.    Yes Historical Provider, MD  esomeprazole (NEXIUM) 20 MG capsule Take 1 capsule (20 mg total) by mouth  daily before breakfast. 11/12/11  Yes Manuela Schwartz, MD  Febuxostat (ULORIC) 80 MG TABS Take 1 tablet by mouth daily.   Yes Historical Provider, MD  fluticasone (FLONASE) 50 MCG/ACT nasal spray Place 1 spray into the nose daily as needed for allergies. For allergies 11/19/10  Yes Denna Haggard, MD  insulin glargine (LANTUS) 100 UNIT/ML injection Inject 62 Units into the skin at bedtime. 04/05/12  Yes Rocco Serene, MD  loratadine (CLARITIN) 10 MG tablet Take 10 mg by mouth daily.   Yes Historical Provider, MD  LORazepam (ATIVAN) 0.5 MG tablet Take 0.5 mg by mouth 2 (two) times daily as needed for anxiety.   Yes Historical Provider, MD  metoprolol (LOPRESSOR) 100 MG tablet Take 100 mg  by mouth every morning.    Yes Historical Provider, MD  simvastatin (ZOCOR) 40 MG tablet Take 40 mg by mouth every evening.   Yes Historical Provider, MD  traMADol (ULTRAM-ER) 100 MG 24 hr tablet Take 100 mg by mouth every 6 (six) hours as needed for pain.   Yes Historical Provider, MD  oxyCODONE-acetaminophen (ROXICET) 5-325 MG per tablet Take 1-2 tablets by mouth every 4 (four) hours as needed for pain. 08/31/12   Chuck Hint, MD   Dg Foot Complete Left  09/12/2012   *RADIOLOGY REPORT*  Clinical Data: Left toe infection, left dorsal foot ulcer for 1 month, rule out osteomyelitis left big toe  LEFT FOOT - COMPLETE 3+ VIEW  Comparison: None.  Findings: Extensive small vessel calcification.  No fracture dislocation.  No periosteal reaction or cortical destruction.  IMPRESSION: No evidence for osteomyelitis.   Original Report Authenticated By: Esperanza Heir, M.D.    Positive ROS: All other systems have been reviewed and were otherwise negative with the exception of those mentioned in the HPI and as above.  Physical Exam: Filed Vitals:   09/13/12 1609  BP: 106/59  Pulse: 108  Temp: 98 F (36.7 C)  Resp: 18   General: Alert, no acute distress Cardiovascular: No pedal edema Respiratory: No cyanosis, no use of accessory musculature GI: No organomegaly, abdomen is soft and non-tender Skin: No lesions in the area of chief complaint Neurologic: Sensation intact distally Psychiatric: Patient is competent for consent with normal mood and affect Lymphatic: No axillary or cervical lymphadenopathy  MUSCULOSKELETAL:  LLE: decreased sensation. Dorsal wound with no drainage of 2cm's. Min surrounding erythema. No TTP. There is erythema and a dry wound at the base of his first toe nail bed. No palpable fluctuance. Palpable DP RLE: decreased sensation , palpable DP  Assessment: L foot cellulitis and pressure wound in the setting of DM  Plan: Continue Abx per primary team No indication for  surgical debridement at this time Will initiate nonoperative management C/s wound care team.  Weight Bearing Status: WBAT PT VTE px: SCD's and Chemical px per the primary team   Margarita Rana, D, MD Cell [phone]   09/13/2012 5:23 PM

## 2012-09-13 NOTE — Progress Notes (Signed)
VASCULAR LAB PRELIMINARY  ARTERIAL  ABI completed:    RIGHT    LEFT    PRESSURE WAVEFORM  PRESSURE WAVEFORM  BRACHIAL  AVF BRACHIAL 110 Triphasic  DP 155 Triphasic DP 138 Triphasic  AT   AT    PT Noncompressible Triphasic PT 153 Triphasic  PER   PER    GREAT TOE  NA GREAT TOE  NA    RIGHT LEFT  ABI 1.41 1.39   Bilateral ABIs are elevated, suggestive of calcified vessels. Waveforms of bilateral lower extremity arteries are within normal limits.   09/13/2012 10:15 AM Gertie Fey, RVT, RDCS, RDMS

## 2012-09-13 NOTE — Progress Notes (Signed)
Went to get pt for dialysis this am at 0625, however his HR was noted to be in the 130's sinus tach.  His documented HR has been in the 90's previously. I spoke with central tele and they said the pt was in the 90's until about 445am when it went up to the 130's.  Wilburt Finlay was notified and the pt was not taken to dialysis at this time.  Dr Arlean Hopping paged and now awaiting call back.

## 2012-09-13 NOTE — Consult Note (Signed)
Patient ID: Adam [name] MRN: [id number], DOB/AGE: 48-15-1967   Admit date: 09/09/2012 Date of Consult: @TODAY @  Primary Physician: Lars Masson, MD Primary Cardiologist: Dietrich Pates  Pt. Profile: patient is a 47 yo who I have followed in clinic for several years.  History of NICM (  Cath 2006:  Normal coronaries; LV gram not done due to markedly elevated LVEDP;  Last echo 12/2011:  LVEF 25%; RVEF moderately reduced; s/p ICD), HTN and renal insufficiency  Asked to see regarding tachycardia.  Problem List: Past Medical History  Diagnosis Date  . Other and unspecified hyperlipidemia   . Insomnia, unspecified   . Obstructive sleep apnea (adult) (pediatric)   . Allergic rhinitis, cause unspecified   . Nonischemic cardiomyopathy   . Unspecified essential hypertension   . Type II or unspecified type diabetes mellitus without mention of complication, not stated as uncontrolled   . CKD (chronic kidney disease) stage 3, GFR 30-59 ml/min   . Esophageal reflux   . Morbid obesity   . Dysuria   . Tobacco use disorder   . Dysrhythmia   . Pacemaker   . Antral ulcer   . Renal azotemia   . Arthritis     Gout w/hyperuricemia  . Morbid obesity     Past Surgical History  Procedure Laterality Date  . Cardiac defibrillator placement  2011  . Esophagogastroduodenoscopy  01/03/2011    Procedure: ESOPHAGOGASTRODUODENOSCOPY (EGD);  Surgeon: Vertell Novak., MD;  Location: [facility] ENDOSCOPY;  Service: Endoscopy;  Laterality: N/A;  . Av fistula placement Right 08/31/2012    Procedure: ARTERIOVENOUS (AV) FISTULA CREATION;  Surgeon: Chuck Hint, MD;  Location: [facility] OR;  Service: Vascular;  Laterality: Right;  . Insertion of dialysis catheter Right 08/31/2012    Procedure: INSERTION OF DIALYSIS CATHETER-Right Internal Jugular Placement;  Surgeon: Chuck Hint, MD;  Location: [facility] OR;  Service: Vascular;  Laterality: Right;     Allergies:  Allergies  Allergen Reactions  . Ace Inhibitors    Acute renal failure with multiple trials    HPI:  Patient is a 47 yo who I have followed in clinic for several years.  History of NICM (Cath 2006:  Normal coronaries; LV gram not done due to markedly elevated LVEDP;  Last echo 12/2011:  LVEF 25%; RVEF moderately reduced; s/p ICD), HTN and renal insufficiency  Asked to see regarding tachycardia.    I saw him in clinic on 7/31  No CP  No edema. In late July he complained increased edema, SOB  Renal function had deteriorated.  On 08/31/12 dialysis catheter as well as AV fistula was placed.  He was admitted on 8/8 with continued wt gain, edema unresponsive to diuretics.  He has started dialysis in the hospital and is net negative 8.9 L  Today at around 4:45 AM patient's HR increased to 130s.  Review of telemetry shows an abrupt increase in HR to 130s  Later an abrupt decline.  Narrow, regular tachycardia.  He had another spell later in day that also terminated quickly  In addition has had a couple spells where HR increased to low 100s, again slowing abruptly  Patient says he has felt his heart racing  Says he feels more SOB at this time.  Denies CP Overall says his breathing is much better since admit, since fluid has been removed.   Inpatient Medications:  .  ceFAZolin (ANCEF) IV  1 g Intravenous Q12H  . darbepoetin      . digoxin  0.25 mg Intravenous Q4H  . [START ON 09/14/2012] digoxin  0.0625 mg Oral Daily  . febuxostat  80 mg Oral Daily  . heparin  5,000 Units Subcutaneous Q8H  . insulin aspart  0-9 Units Subcutaneous TID WC  . insulin glargine  15 Units Subcutaneous QHS  . metoprolol tartrate  12.5 mg Oral BID  . pantoprazole  40 mg Oral Daily  . simvastatin  40 mg Oral QPM  . sodium chloride  3 mL Intravenous Q12H    Family History  Problem Relation Age of Onset  . Diabetes Mother   . Microcephaly Father   . Lung cancer Father      History   Social History  . Marital Status: Single    Spouse Name: N/A    Number of Children:  N/A  . Years of Education: N/A   Occupational History  . Not on file.   Social History Main Topics  . Smoking status: Current Every Day Smoker -- 0.50 packs/day for .5 years    Types: Cigarettes  . Smokeless tobacco: Never Used     Comment: started smoking at age 86. Will stop on his own - cutting back now- was smoking a ppd - now only 3 a day  . Alcohol Use: No     Comment: No alcohol x 1 month.  . Drug Use: No  . Sexual Activity: Not on file   Other Topics Concern  . Not on file   Social History Narrative  . No narrative on file     Review of Systems: All other systems reviewed and are otherwise negative except as noted above.  Physical Exam: Filed Vitals:   09/13/12 1609  BP: 106/59  Pulse: 108  Temp: 98 F (36.7 C)  Resp: 18    Intake/Output Summary (Last 24 hours) at 09/13/12 1747 Last data filed at 09/13/12 1609  Gross per 24 hour  Intake    120 ml  Output   4303 ml  Net  -4183 ml   Net neg since admit :  8.9 L  General: Well developed, well nourished, in no acute distress. Head: Normocephalic, atraumatic, sclera non-icteric Neck: Negative for carotid bruits. JVP 10 cm Lungs: Rales at R base.  Rhonchi.   Heart: RRR with S1 S2. No murmurs, rubs, or gallops appreciated. Abdomen: Soft, non-tender, non-distended with normoactive bowel sounds. No hepatomegaly. No rebound/guarding. No obvious abdominal masses. Msk:  Strength and tone appears normal for age. Extremities: No clubbing, cyanosis  1+ edema  Distal pedal pulses are 2+ and equal bilaterally. Neuro: Alert and oriented X 3. Moves all extremities spontaneously. Psych:  Responds to questions appropriately with a normal affect.  Labs: Results for orders placed during the hospital encounter of 09/09/12 (from the past 24 hour(s))  GLUCOSE, CAPILLARY     Status: Abnormal   Collection Time    09/12/12  9:56 PM      Result Value Range   Glucose-Capillary 133 (*) 70 - 99 mg/dL  GLUCOSE, CAPILLARY      Status: None   Collection Time    09/13/12  7:24 AM      Result Value Range   Glucose-Capillary 89  70 - 99 mg/dL   Comment 1 Call MD NNP PA CNM     Comment 2 Procedure Error    GLUCOSE, CAPILLARY     Status: Abnormal   Collection Time    09/13/12 11:33 AM      Result Value Range   Glucose-Capillary  123 (*) 70 - 99 mg/dL  CBC     Status: Abnormal   Collection Time    09/13/12 12:00 PM      Result Value Range   WBC 6.4  4.0 - 10.5 K/uL   RBC 3.10 (*) 4.22 - 5.81 MIL/uL   Hemoglobin 8.2 (*) 13.0 - 17.0 g/dL   HCT 47.8 (*) 29.5 - 62.1 %   MCV 86.5  78.0 - 100.0 fL   MCH 26.5  26.0 - 34.0 pg   MCHC 30.6  30.0 - 36.0 g/dL   RDW 30.8 (*) 65.7 - 84.6 %   Platelets 180  150 - 400 K/uL  MAGNESIUM     Status: None   Collection Time    09/13/12 12:00 PM      Result Value Range   Magnesium 2.1  1.5 - 2.5 mg/dL  RENAL FUNCTION PANEL     Status: Abnormal   Collection Time    09/13/12 12:00 PM      Result Value Range   Sodium 134 (*) 135 - 145 mEq/L   Potassium 3.7  3.5 - 5.1 mEq/L   Chloride 99  96 - 112 mEq/L   CO2 21  19 - 32 mEq/L   Glucose, Bld 147 (*) 70 - 99 mg/dL   BUN 73 (*) 6 - 23 mg/dL   Creatinine, Ser 9.62 (*) 0.50 - 1.35 mg/dL   Calcium 9.0  8.4 - 95.2 mg/dL   Phosphorus 4.1  2.3 - 4.6 mg/dL   Albumin 3.1 (*) 3.5 - 5.2 g/dL   GFR calc non Af Amer 16 (*) >90 mL/min   GFR calc Af Amer 19 (*) >90 mL/min  TROPONIN I     Status: None   Collection Time    09/13/12 12:00 PM      Result Value Range   Troponin I <0.30  <0.30 ng/mL  IRON AND TIBC     Status: Abnormal   Collection Time    09/13/12  1:00 PM      Result Value Range   Iron 19 (*) 42 - 135 ug/dL   TIBC 841  324 - 401 ug/dL   Saturation Ratios 5 (*) 20 - 55 %   UIBC 387  125 - 400 ug/dL  GLUCOSE, CAPILLARY     Status: Abnormal   Collection Time    09/13/12  4:33 PM      Result Value Range   Glucose-Capillary 104 (*) 70 - 99 mg/dL    Radiology/Studies:   Dg Chest Port 1 View  2012-09-22   *RADIOLOGY  REPORT*  Clinical Data: Lower extremity swelling.  Weakness and dizziness.  PORTABLE CHEST - 1 VIEW  Comparison: 08/31/2012.  Findings: The pacer wire / AICD is stable.  The dialysis catheter is unchanged.  The heart is enlarged and there is vascular congestion and pulmonary edema.  No definite pleural effusion.  IMPRESSION: Cardiac enlargement and pulmonary edema suggesting CHF.  No definite pleural effusion.   Original Report Authenticated By: Rudie Meyer, M.D.     EKG:  8/18 at 8:40  SR 79 bpm with occasional PAC, PVC.  First degree AV block 232 msec.  Low voltage  ASSESSMENT AND PLAN:    1.  Tachycardia.  Patient has had episodes of tachycardia that are rapid on and terminate abruptly  Some appears to be atrial flutter, some probably atrial fibrillation.  He has not had this captured in the past. May be exacerbated by volume overload. He reports  some SOB that may be associated with tachycardia. Currently he appears to be in SR   I would recomm anticoagulating with heparin for now.  Should be on long term anticoagulation Would try to get EKGs when patient in tachycardia  Telemetry is difficult.    I would increase lopressor. Hold dig  given that patient is on dialysis.    2.  Acute on chronic systolic CHF  Nonischemic  Volume status is improving.  Still with some increase on exam  To continue dialysis.. Patient s/p ICD (St Jude)  Will have interrogated.    3.  ESRD  Continues dialysis  4.  HTN  Improved with volume removal   Signed, Dietrich Pates 09/13/2012, 5:47 PM

## 2012-09-13 NOTE — Progress Notes (Signed)
I have seen and examined this patient and agree with plan as outlined by Dr. Shirlee Latch.  Pt has dilated CMP and ESRD.  He is c/o nausea and malaise after HD with variable HR and low BP's.  He has gained 3kg since yesterday and has evidence of volume excess.  Will need to restrict fluid intake and educate pt regarding this and sodium restriction.  Would also ask cardiology to assist with rate control as this will interfere with volume removal on HD.  Plan for HD today. Albeiro Trompeter A,MD 09/13/2012 9:04 AM

## 2012-09-14 ENCOUNTER — Telehealth: Payer: Self-pay | Admitting: Internal Medicine

## 2012-09-14 DIAGNOSIS — Z5189 Encounter for other specified aftercare: Secondary | ICD-10-CM

## 2012-09-14 DIAGNOSIS — I4892 Unspecified atrial flutter: Secondary | ICD-10-CM

## 2012-09-14 LAB — CBC
HCT: 26 % — ABNORMAL LOW (ref 39.0–52.0)
Hemoglobin: 8.2 g/dL — ABNORMAL LOW (ref 13.0–17.0)
MCH: 27.1 pg (ref 26.0–34.0)
MCHC: 31.5 g/dL (ref 30.0–36.0)
MCV: 85.8 fL (ref 78.0–100.0)
RBC: 3.03 MIL/uL — ABNORMAL LOW (ref 4.22–5.81)

## 2012-09-14 LAB — BASIC METABOLIC PANEL
BUN: 57 mg/dL — ABNORMAL HIGH (ref 6–23)
BUN: 59 mg/dL — ABNORMAL HIGH (ref 6–23)
CO2: 22 mEq/L (ref 19–32)
CO2: 20 mEq/L (ref 19–32)
Calcium: 9 mg/dL (ref 8.4–10.5)
Chloride: 99 mEq/L (ref 96–112)
Creatinine, Ser: 3.51 mg/dL — ABNORMAL HIGH (ref 0.50–1.35)
Creatinine, Ser: 3.61 mg/dL — ABNORMAL HIGH (ref 0.50–1.35)
GFR calc non Af Amer: 19 mL/min — ABNORMAL LOW (ref >90)
Glucose, Bld: 123 mg/dL — ABNORMAL HIGH (ref 70–99)
Glucose, Bld: 148 mg/dL — ABNORMAL HIGH (ref 70–99)
Sodium: 136 mEq/L (ref 135–145)

## 2012-09-14 LAB — PARATHYROID HORMONE, INTACT (NO CA): PTH: 397.1 pg/mL — ABNORMAL HIGH (ref 14.0–72.0)

## 2012-09-14 MED ORDER — HEPARIN BOLUS VIA INFUSION
4000.0000 [IU] | Freq: Once | INTRAVENOUS | Status: AC
  Administered 2012-09-14: 4000 [IU] via INTRAVENOUS
  Filled 2012-09-14: qty 4000

## 2012-09-14 MED ORDER — CEPHALEXIN 250 MG PO CAPS
250.0000 mg | ORAL_CAPSULE | Freq: Every day | ORAL | Status: DC
  Filled 2012-09-14: qty 1

## 2012-09-14 MED ORDER — POTASSIUM CHLORIDE CRYS ER 20 MEQ PO TBCR
40.0000 meq | EXTENDED_RELEASE_TABLET | Freq: Once | ORAL | Status: AC
  Administered 2012-09-14: 40 meq via ORAL
  Filled 2012-09-14: qty 2

## 2012-09-14 MED ORDER — CEFAZOLIN SODIUM-DEXTROSE 2-3 GM-% IV SOLR: 2.0000 g | INTRAVENOUS | Status: DC

## 2012-09-14 MED ORDER — SODIUM CHLORIDE 0.9 % IV SOLN
125.0000 mg | INTRAVENOUS | Status: DC
  Administered 2012-09-16 – 2012-09-25 (×5): 125 mg via INTRAVENOUS
  Filled 2012-09-14 (×11): qty 10

## 2012-09-14 NOTE — Progress Notes (Signed)
09/14/2012 11:22 AM Hemodialysis Outpatient Note: this patient has been accepted at the [facility] on a Tues/Thurs/Sat 1st shift schedule. His chair time will be 6:45 AM. Due to the early start of his schedule the center is requesting that the patient come in the day before to sign his paperwork/consents.Please advise center when patient is discharged. Thank you. Tilman Neat

## 2012-09-14 NOTE — Progress Notes (Addendum)
Subjective:  Pt seen and examined in AM. Pt reports he is feeling well with no palpitations, dyspnea, or CP. States swelling is slowly improving. Still with left foot pain.   Paged by nuse in PM concerning 11 beats of Vtach this PM. He report feeling chest soreness after exercising and sitting in his chair. He denies CP, dyspea, SOB, diaphoresis, or palpitations. Stat 12-lead EKG with normal sinus rhythm with no ischemic changes.     Per nurse also with 11 beats vtach yesterday afternoon and 10 beats this morning. Obtained stat 12-lead EKG, troponin, and BMP and Mg levels. Will consult with cardiology, to see patient today.      Objective: Vital signs in last 24 hours: Filed Vitals:   09/13/12 1600 09/13/12 1609 09/13/12 2047 09/14/12 0453  BP: 123/61 106/59 105/64 104/66  Pulse: 85 108 103 102  Temp:  98 F (36.7 C) 99.3 F (37.4 C) 99 F (37.2 C)  TempSrc:  Oral Oral Oral  Resp: 18 18 18 18   Height:   6\' 1"  (1.854 m)   Weight:  154.9 kg (341 lb 7.9 oz) 155.3 kg (342 lb 6 oz)   SpO2:  98% 96% 98%   Weight change: -0.72 kg (-1 lb 9.4 oz)  Intake/Output Summary (Last 24 hours) at 09/14/12 0803 Last data filed at 09/14/12 0501  Gross per 24 hour  Intake    773 ml  Output   4353 ml  Net  -3580 ml   Constitutional: Obese, NAD Head: Normocephalic Eyes: PERRL Neck: Supple Cardiovascular: RRR, S1 normal, S2 normal, pitting +3 edema b/l from thighs to abdomen.  Pulmonary/Chest: Air entry equal bilaterally. Diffuse wheezing Abdominal: Soft. Non-tender, non-distended, bowel sounds are normal, no masses, organomegaly, or guarding present.  Musculoskeletal: No synovitis, effusions, or erythema of bilateral upper and lower  appendicular joints  Neurological: A&O x3 Skin: Chronic venous stasis changes of bilateral lower legs, w/ oozing lesion on right leg, left foot forefoot ulceration wrapped in gauze    Lab Results: Basic Metabolic Panel:  Recent Labs Lab  09/11/12 0631 09/13/12 1200 09/14/12 0430  NA 137 134* 136  K 3.6 3.7 3.4*  CL 103 99 99  CO2 20 21 22   GLUCOSE 99 147* 123*  BUN 83* 73* 57*  CREATININE 3.58* 3.97* 3.51*  CALCIUM 9.0 9.0 9.0  MG  --  2.1 2.0  PHOS 4.8* 4.1  --    Liver Function Tests:  Recent Labs Lab 09/09/12 1915  09/11/12 0631 09/13/12 1200  AST 14  --   --   --   ALT 6  --   --   --   ALKPHOS 145*  --   --   --   BILITOT 1.7*  --   --   --   PROT 7.6  --   --   --   ALBUMIN 3.3*  < > 3.3* 3.1*  < > = values in this interval not displayed. No results found for this basename: LIPASE, AMYLASE,  in the last 168 hours No results found for this basename: AMMONIA,  in the last 168 hours CBC:  Recent Labs Lab 09/13/12 1200 09/14/12 0430  WBC 6.4 7.3  HGB 8.2* 8.2*  HCT 26.8* 26.0*  MCV 86.5 85.8  PLT 180 136*   Cardiac Enzymes:  Recent Labs Lab 09/13/12 1200 09/13/12 1824 09/13/12 2030  TROPONINI <0.30 <0.30 <0.30   BNP: No results found for this basename: PROBNP,  in the  last 168 hours D-Dimer: No results found for this basename: DDIMER,  in the last 168 hours CBG:  Recent Labs Lab 09/12/12 2156 09/13/12 0724 09/13/12 1133 09/13/12 1633 09/13/12 2049 09/14/12 0721  GLUCAP 133* 89 123* 104* 176* 152*   Hemoglobin A1C: No results found for this basename: HGBA1C,  in the last 168 hours Fasting Lipid Panel: No results found for this basename: CHOL, HDL, LDLCALC, TRIG, CHOLHDL, LDLDIRECT,  in the last 168 hours Thyroid Function Tests: No results found for this basename: TSH, T4TOTAL, FREET4, T3FREE, THYROIDAB,  in the last 168 hours Coagulation: No results found for this basename: LABPROT, INR,  in the last 168 hours Anemia Panel:  Recent Labs Lab 09/13/12 1300  TIBC 406  IRON 19*   Urine Drug Screen: Drugs of Abuse     Component Value Date/Time   LABOPIA NEG 03/17/2006 1455   COCAINSCRNUR NEG 03/17/2006 1455   LABBENZ NEG 03/17/2006 1455   AMPHETMU NEG 03/17/2006 1455     Alcohol Level: No results found for this basename: ETH,  in the last 168 hours Urinalysis: No results found for this basename: COLORURINE, APPERANCEUR, LABSPEC, PHURINE, GLUCOSEU, HGBUR, BILIRUBINUR, KETONESUR, PROTEINUR, UROBILINOGEN, NITRITE, LEUKOCYTESUR,  in the last 168 hours Misc. Labs:   Micro Results: No results found for this or any previous visit (from the past 240 hour(s)). Studies/Results: Dg Foot Complete Left  09/12/2012   *RADIOLOGY REPORT*  Clinical Data: Left toe infection, left dorsal foot ulcer for 1 month, rule out osteomyelitis left big toe  LEFT FOOT - COMPLETE 3+ VIEW  Comparison: None.  Findings: Extensive small vessel calcification.  No fracture dislocation.  No periosteal reaction or cortical destruction.  IMPRESSION: No evidence for osteomyelitis.   Original Report Authenticated By: Esperanza Heir, M.D.   Medications: I have reviewed the patient's current medications. Scheduled Meds: .  ceFAZolin (ANCEF) IV  1 g Intravenous Q12H  . febuxostat  80 mg Oral Daily  . insulin aspart  0-9 Units Subcutaneous TID WC  . insulin glargine  15 Units Subcutaneous QHS  . metoprolol tartrate  12.5 mg Oral QID  . pantoprazole  40 mg Oral Daily  . simvastatin  40 mg Oral QPM  . sodium chloride  3 mL Intravenous Q12H   Continuous Infusions: . heparin 2,100 Units/hr (09/14/12 0616)   PRN Meds:.sodium chloride, sodium chloride, sodium chloride, acetaminophen, diclofenac sodium, docusate sodium, feeding supplement (NEPRO CARB STEADY), lidocaine (PF), lidocaine-prilocaine, LORazepam, ondansetron (ZOFRAN) IV, pentafluoroprop-tetrafluoroeth, sodium chloride, traMADol Assessment/Plan: Principal Problem:   CKD (chronic kidney disease) stage 4, GFR 15-29 ml/min Active Problems:   Type II or unspecified type diabetes mellitus with renal manifestations, uncontrolled(250.42)   DYSLIPIDEMIA   Hypopotassemia   Morbid obesity   ANXIETY   TOBACCO ABUSE   OBSTRUCTIVE SLEEP APNEA    HYPERTENSION   CARDIOMYOPATHY   Chronic systolic heart failure   GERD   Gastric ulcer   Leg swelling   Knee pain   ICD (implantable cardioverter-defibrillator), single, in situ   Normocytic anemia   Open wound of foot   End stage renal disease   #CKD Stage 4 to ESRD with severe volume overload refractory to diuretic therapy necessitating emergent HD, with right arm AV cimino fistula and perm cath last week  -HD 8/14 -  tolerated well, 4L off, catheter running high pressure, episode of hypotension and tachycardia - asymptomatic -HD 8/16 - tolerated well, 3L off  -HD 8/18 - tolerated well, 4L off -Cr downtrending  -Continue renal diet,  1.2L fluid restriction per nephrology recs    -Normocytic Anemia - H/H 8.7/27.9, to receive weekly erythropoetin injection, target Hct 33-36%, iron 19, TIBC WNL, Iron sat low, consider IV iron therapy  -Monitor Potassium - 3.4 on 8/14 now WNL    -Monitor Phosphate - hyperphosphatemia -  4.8 on  8/16,  repeat  phosphate restriction 1g/d , consider phosphate binder therapy with meals -Corrected calcium - WNL,  vit D 25-OH deficiency (low 10),  PTH pending  -Strict I & O's - 4.L yesterday -Monitor daily weight  - 363lb to 342lb -Hep B S Ag negative, Hep core Ag negative  -Case Manager Consult - Will need to apply for [facility] aide, to apply for personal care services after discharge -Social Work consult - needs transportation to and from HD and INR checks   # Vtach in setting of tachycardia - asymptomatic, stable, on 8/18  HR 130s, SBP in 90s, 3 episodes of Vtach 8/18 and 8/19   -Stat 12-lead EKG - normal sinus, no ischemic changes, prior EKG with 1st degree AV block -Will need interrogation of AICD -Stat BMP and Mg levels - WNL -Orthostatic labs - negative  -Bolus as needed if SBP<90 -Cardiology consult -  Probable atrial fibrallation/Afib start heparin and PO  metprolol 12.5mg  x4 daily, hold digoxin. Consider transition to coumadin, awaiting cardiology recs    #Nausea and Vomiting - improved, nausea w/o vomiting or  abdominal pain, normal BM, likely due to HD and electrolyte/fluid shift;  started 8/15, 1 episode of green emesis on 8/16 and again 8/17 after eating   -Zofran as needed for nausea  Left forefoot wound with paronychia infection of left great toe  - 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 Left great toe with what appears to be an infected paronychia, recommend I & D of paronychia and antibiotics -Obtain Xray left foot  - no osteomyelitis  -Obtain ABI - R 1.41 L 1.39 - suggesting calcified vessels, waveforms of b/l LE WNL -Ortho consult - Per Dr. Eulah Pont no surgical debridement at this time, continue wound care, antibiotics - will transition IV ancef to oral keflex 500mg  BID for 6 days beg tomm AM    # Somnolence - improving. Pt reports increased sleepiness after starting HD  --ABG on 8/15- hypoxia, hypocapnia, low bicarbonate - D/c narcotics    #DM c/b dyslipidemia - last HbA1c 9.1 on 07/27/2012  -CBG WNL today - Lantus 15 U q pm  - SSI for meals  - Continue home statin  - Lipid panel 03/2012 - low HDL (26)  # Acute on chronic CHF with EF of 25% - Nonischemic cardiomyopathy with AICD (2011)  -Obtain cardiology consult - PO  metprolol 12.5mg  x4 daily, hold digoxin, start heparin per pharmacy--consider transition to coumadin - CXR (8/15) --> cardiomegaly and pulmonary edema suggesting CHF, no pleural effusion   - Monitor on telemetry  #OSA  - CPAP at  night    # Anxiety  - Ativan 0.5 mg prn   #Knee Pain  - Tylenold prn for mild pain  - Tramadol,  Oxycodone AS NEEDED due to somnolence and hypotension    - Voltaren Gel   # Gout  - Febuxostat   #GERD  - Continue home PPI   # Prophylaxis  - sq heparin  Dispo: Disposition  is deferred at this time, awaiting improvement of current medical problems.  Anticipated discharge in approximately  day(s).   The patient does have a current PCP Lars Masson, MD) and does need an [facility] hospital follow-up appointment after discharge.  The patient does have transportation limitations that hinder transportation to clinic appointments.  .Services Needed at time of discharge: Y = Yes, Blank = No PT:   OT:   RN:   Equipment:   Other:     LOS: 5 days   Otis Brace, MD 09/14/2012, 8:03 AM

## 2012-09-14 NOTE — Progress Notes (Signed)
ANTICOAGULATION CONSULT NOTE - Follow Up Consult  Pharmacy Consult for heparin Indication: atrial fibrillation  Labs:  Recent Labs  09/11/12 0631 09/13/12 1200 09/13/12 1824 09/13/12 2030 09/14/12 0430  HGB 8.3* 8.2*  --   --  8.2*  HCT 26.2* 26.8*  --   --  26.0*  PLT 189 180  --   --  136*  HEPARINUNFRC  --   --   --   --  <0.10*  CREATININE 3.58* 3.97*  --   --  3.51*  TROPONINI  --  <0.30 <0.30 <0.30  --     Assessment: 47yo male undetectable on heparin with initial dosing for Afib; of note pt has limited access and requires pausing of gtt for lab draw which affects level value (was held ~70min).  Goal of Therapy:  Heparin level 0.3-0.7 units/ml   Plan:  Will rebolus with heparin 4000 units x1 and increase gtt by 4 units/kg(ABW)/hr to 2100 units/hr and check level in 6hr.  Vernard Gambles, PharmD, BCPS  09/14/2012,5:50 AM

## 2012-09-14 NOTE — Progress Notes (Signed)
Physical Therapy Treatment Patient Details Name: Adam [name] MRN: [id number] DOB: [birth year] Today's Date: 09/14/2012 Time: 4098-1191 PT Time Calculation (min): 28 min  PT Assessment / Plan / Recommendation  History of Present Illness Pt adm to [facility] due to 50lb weight gain in less than a month, pt admits to increased SOB recenty and Lt LE pain/swelling from fluid retention. Pt with unctrolled diabetes and cardiomyopathy.     PT Comments   Pt feeling better and glad to be up walking.  Pt encouraged to ambulate with staff later.  Follow Up Recommendations  Home health PT;Supervision for mobility/OOB     Does the patient have the potential to tolerate intense rehabilitation     Barriers to Discharge        Equipment Recommendations  None recommended by PT    Recommendations for Other Services    Frequency     Progress towards PT Goals Progress towards PT goals: Progressing toward goals  Plan Current plan remains appropriate    Precautions / Restrictions Precautions Precautions: None Restrictions Weight Bearing Restrictions: No   Pertinent Vitals/Pain Moderate pain L LE esp foot, repositioned to comfort    Mobility  Bed Mobility Details for Bed Mobility Assistance: sitting EOB on arrival Transfers Transfers: Sit to Stand;Stand to Sit Sit to Stand: 4: Min guard;From bed;With upper extremity assist Stand to Sit: 4: Min guard;With armrests;To chair/3-in-1 Details for Transfer Assistance: cues for hand placement and safety; pt attempts to pull up on RW for sit to stand  Ambulation/Gait Ambulation/Gait Assistance: 5: Supervision Ambulation Distance (Feet): 200 Feet Assistive device: Rolling walker Ambulation/Gait Assistance Details: pt required a 3 standing rest breaks due to fatigue and SOB, ambulated on RA with SaO2 94% and HR 114 bpm, pt continues to amb with L LE ER and outside of RW due to pain and states thighs rubbing together with amb from increased fluid Gait Pattern:  Step-to pattern;Decreased stance time - left;Decreased step length - right;Wide base of support;Trunk flexed Gait velocity: decreased due to pain  Stairs: No Wheelchair Mobility Wheelchair Mobility: No    Exercises General Exercises - Lower Extremity Ankle Circles/Pumps: AROM;Seated;15 reps;Both Long Arc Quad: AROM;Both;15 reps;Seated Heel Slides: AROM;Both;Supine;10 reps Hip ABduction/ADduction: AROM;Both;15 reps;Supine Hip Flexion/Marching: AROM;10 reps;Right;Limitations Hip Flexion/Marching Limitations: very limited AROM on L so pt reported he did not wish to perform   PT Diagnosis:    PT Problem List:   PT Treatment Interventions:     PT Goals (current goals can now be found in the care plan section)    Visit Information  Last PT Received On: 09/14/12 Assistance Needed: +1 History of Present Illness: Pt adm to [facility] due to 50lb weight gain in less than a month, pt admits to increased SOB recenty and Lt LE pain/swelling from fluid retention. Pt with unctrolled diabetes and cardiomyopathy.      Subjective Data      Cognition  Cognition Arousal/Alertness: Awake/alert Behavior During Therapy: WFL for tasks assessed/performed Overall Cognitive Status: Within Functional Limits for tasks assessed    Balance     End of Session PT - End of Session Activity Tolerance: Patient limited by fatigue Patient left: in chair;with call bell/phone within reach;with nursing/sitter in room   GP     Marisa Hage,KATHrine E 09/14/2012, 11:16 AM Zenovia Jarred, PT, DPT 09/14/2012 Pager: [phone]

## 2012-09-14 NOTE — Progress Notes (Addendum)
  Date: 09/14/2012  Patient name: Adam [name]  Medical record number: [id number]  Date of birth: [birth year]   This patient has been seen and the plan of care was discussed with the house staff. Please see their note for complete details. I concur with their findings with the following additions/corrections:  Appreciate Nephro input.  Cards recommends anticoagulation and transition to coumadin due to concern for Afib/Aflutter.   He is medically improving, but states he still feels he has a lot of volume on board.  Continue HD per nephro. When ok with cards, will D/C. No current need for surgical debridement of LLE wound. Wound care. I would treat with Keflex for 10 days.   Jonah Blue, DO 09/14/2012, 1:05 PM

## 2012-09-14 NOTE — Telephone Encounter (Signed)
New Problem  Dr. Jeanene Erb about medications and wanting to start the pt on Coumadin.. Dr. Request a call back

## 2012-09-14 NOTE — Care Management Note (Signed)
   CARE MANAGEMENT NOTE 09/14/2012  Patient:  Adam [name], Adam [name]   Account Number:  [id number]  Date Initiated:  09/13/2012  Documentation initiated by:  Kenadie Royce  Subjective/Objective Assessment:   CM referral for [facility] aide.     Action/Plan:   Noted pt has Medicaid and may qualify for Personal Care Services, however application must be completed post d/c and by pt PCP. Discussed process with pt and will provide application to be given to pt at time od d/c.   Anticipated DC Date:  09/16/2012   Anticipated DC Plan:  HOME/SELF CARE         Choice offered to / List presented to:             Status of service:  In process, will continue to follow Medicare Important Message given?   (If response is "NO", the following Medicare IM given date fields will be blank) Date Medicare IM given:   Date Additional Medicare IM given:    Discharge Disposition:  HOME/SELF CARE  Per UR Regulation:    If discussed at Long Length of Stay Meetings, dates discussed:    Comments:  09/14/2012 This pt will not be homebound, question if he will qualify for [facility] aide services. He just needs help with cleaning and cooking. Johny Shock RN MPH, case manager, [phone]  09/13/2012  HH aide services for assistance with meals and light house work is not available except with Personal Care Service thru Medicaid, and as this pt has Medicaid he can begin this process once d/c from the hospital. Johny Shock RN MPH Case Manager [phone]

## 2012-09-14 NOTE — Progress Notes (Signed)
I have seen and examined this patient and agree with plan as outlined by Dr. Shirlee Latch.  Pt is off schedule TTS.  Will get back on this Thursday.  Will also start IV Iron with HD.  Dispo pending Cardiology. Merl Guardino A,MD 09/14/2012 10:34 AM

## 2012-09-14 NOTE — Progress Notes (Signed)
Lab notified for STAT heparin draw. Per lab, phlebotomist has already been notified. Will continue to monitor. Gilman Schmidt

## 2012-09-14 NOTE — Telephone Encounter (Signed)
I will forward to Dr Tenny Craw to return call to Dr.

## 2012-09-14 NOTE — Progress Notes (Signed)
Pt had a 8 beat run of vtach, then a minute after had a 5 beat run of vtach, non sustained. Cater MD made aware. No new orders at this time. Will continue to monitor. Gilman Schmidt

## 2012-09-14 NOTE — Progress Notes (Signed)
[facility] Progress NOTE    Subjective: Patient feels better today. Denies pain. Sob with lying flat and exertion.    Current medications: Current Facility-Administered Medications  Medication Dose Route Frequency Provider Last Rate Last Dose  . 0.9 %  sodium chloride infusion  100 mL Intravenous PRN Lauris Poag, MD      . 0.9 %  sodium chloride infusion  100 mL Intravenous PRN Lauris Poag, MD      . 0.9 %  sodium chloride infusion  250 mL Intravenous PRN Na Dierdre Searles, MD      . acetaminophen (TYLENOL) tablet 650 mg  650 mg Oral Q6H PRN Jonah Blue, DO      . ceFAZolin (ANCEF) IVPB 1 g/50 mL premix  1 g Intravenous Q12H Marjan Rabbani, MD   1 g at 09/14/12 0616  . diclofenac sodium (VOLTAREN) 1 % transdermal gel 2 g  2 g Topical QID PRN Jonah Blue, DO      . docusate sodium (COLACE) capsule 100 mg  100 mg Oral BID PRN Jonah Blue, DO      . febuxostat (ULORIC) tablet 80 mg  80 mg Oral Daily Alejandro Paya, DO   80 mg at 09/13/12 1020  . feeding supplement (NEPRO CARB STEADY) liquid 237 mL  237 mL Oral PRN Lauris Poag, MD      . heparin ADULT infusion 100 units/mL (25000 units/250 mL)  2,100 Units/hr Intravenous Continuous Colleen Can, [facility] 21 mL/hr at 09/14/12 0616 2,100 Units/hr at 09/14/12 0616  . insulin aspart (novoLOG) injection 0-9 Units  0-9 Units Subcutaneous TID WC Linward Headland, MD   2 Units at 09/14/12 0813  . insulin glargine (LANTUS) injection 15 Units  15 Units Subcutaneous QHS Na Li, MD   15 Units at 09/13/12 2244  . lidocaine (PF) (XYLOCAINE) 1 % injection 5 mL  5 mL Intradermal PRN Lauris Poag, MD      . lidocaine-prilocaine (EMLA) cream 1 application  1 application Topical PRN Lauris Poag, MD      . LORazepam (ATIVAN) tablet 0.5 mg  0.5 mg Oral BID PRN Jonah Blue, DO   0.5 mg at 09/11/12 1334  . metoprolol tartrate (LOPRESSOR) tablet 12.5 mg  12.5 mg Oral QID Pricilla Riffle, MD   12.5 mg at 09/13/12 2245  . ondansetron (ZOFRAN)  injection 4 mg  4 mg Intravenous PRN Marjan Rabbani, MD      . pantoprazole (PROTONIX) EC tablet 40 mg  40 mg Oral Daily Linward Headland, MD   40 mg at 09/13/12 1021  . pentafluoroprop-tetrafluoroeth (GEBAUERS) aerosol 1 application  1 application Topical PRN Lauris Poag, MD      . simvastatin (ZOCOR) tablet 40 mg  40 mg Oral QPM Linward Headland, MD   40 mg at 09/13/12 1743  . sodium chloride 0.9 % injection 3 mL  3 mL Intravenous Q12H Na Li, MD   3 mL at 09/13/12 2245  . sodium chloride 0.9 % injection 3 mL  3 mL Intravenous PRN Na Li, MD   3 mL at 09/10/12 1137  . traMADol (ULTRAM) tablet 50 mg  50 mg Oral Q12H PRN Na Li, MD   50 mg at 09/12/12 2233     Vital Signs: Blood pressure 104/66, pulse 102, temperature 99 F (37.2 C), temperature source Oral, resp. rate 18, height 6\' 1"  (1.854 m), weight 342 lb 6 oz (155.3 kg), SpO2 98.00%.  Weight trends: Ceasar Mons  Weights   09/13/12 1300 09/13/12 1609 09/13/12 2047  Weight: 351 lb 6.6 oz (159.4 kg) 341 lb 7.9 oz (154.9 kg) 342 lb 6 oz (155.3 kg)    Physical Exam: General: Vital signs reviewed and noted. Well-developed, obese, in no acute distress;  appropriate and cooperative throughout examination.  Head: Normocephalic, atraumatic.  Lungs:  ctab  Heart: Slightly tachycardic   Abdomen:  BS normoactive. Soft, obese, non-tender.    Extremities: B/l 1+ edema, no cyanosis, hyperpigmentation and thickening to lower extremities. Left great toe with wound  Neurologic: Alert and oriented x 3   Skin: hyperpigmentation and thickening to lower extremities    Lab results: Basic Metabolic Panel:  Recent Labs Lab 09/10/12 1339 09/11/12 0631 09/13/12 1200 09/14/12 0430  NA 137 137 134* 136  K 3.5 3.6 3.7 3.4*  CL 102 103 99 99  CO2 20 20 21 22   GLUCOSE 134* 99 147* 123*  BUN 80* 83* 73* 57*  CREATININE 3.37* 3.58* 3.97* 3.51*  CALCIUM 8.8 9.0 9.0 9.0  MG  --   --  2.1 2.0  PHOS 4.4 4.8* 4.1  --     Liver Function Tests:  Recent Labs Lab  09/09/12 1915 09/10/12 1339 09/11/12 0631 09/13/12 1200  AST 14  --   --   --   ALT 6  --   --   --   ALKPHOS 145*  --   --   --   BILITOT 1.7*  --   --   --   PROT 7.6  --   --   --   ALBUMIN 3.3* 3.2* 3.3* 3.1*    CBC:  Recent Labs Lab 09/09/12 1915 09/11/12 0631 09/13/12 1200 09/14/12 0430  WBC 6.7 6.9 6.4 7.3  HGB 8.7* 8.3* 8.2* 8.2*  HCT 27.9* 26.2* 26.8* 26.0*  MCV 85.3 85.6 86.5 85.8  PLT 230 189 180 136*   CBG:  Recent Labs Lab 09/13/12 0724 09/13/12 1133 09/13/12 1633 09/13/12 2049 09/14/12 0721  GLUCAP 89 123* 104* 176* 152*     Imaging: Dg Foot Complete Left  09/12/2012   *RADIOLOGY REPORT*  Clinical Data: Left toe infection, left dorsal foot ulcer for 1 month, rule out osteomyelitis left big toe  LEFT FOOT - COMPLETE 3+ VIEW  Comparison: None.  Findings: Extensive small vessel calcification.  No fracture dislocation.  No periosteal reaction or cortical destruction.  IMPRESSION: No evidence for osteomyelitis.   Original Report Authenticated By: Esperanza Heir, M.D.     Assessment & Plan: Pt is a 47 y.o. 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 He presented to the Internal Medicine Clinic with weight gain of 50 lbs during the last month (epic review shows weight 316-->350 lbs), increased shortness of breath, lower extremity edema and abdominal bloating.  He recently had a right AVF placed on 08/31/12.  1. Fluid overload secondary to end stage renal disease  -Renal following -Patient had HD Thursday, Saturday, Monday -pending PTH. Fe studies indicate Fe def. vitamin D level (10) low -will get HD outpatient at [facility] -renal to dose Aranesp in the future  -trend daily weights and i/o's   2. 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  Will likely benefit from HD.  -Monitor strict i/o, daily weights  -BB (Metoprolol 12.5 mg qid), Continue statin, allergic to ACEI. -cardiology consulted per cards hold Dig with HD  3. Possible Atrial fibrillation  -on heparin gtt per cards will need anticoagulation long term.  Lopressor 12.5 mg qid   4. Diabetes 2 uncontrolled (HA1C 9.1 in 07/2012)  -hyperglycemia intermittently. primary team addressing   5. Hypotension  -Etiology could be related to anemia, medications, heart failure history.  monitor BP still soft and dose of BB increased in freq   6. Left great toe infection -Continue Abx x 7 days per ortho   7. F/E/N -renal diet  -K is 3.4 will replace Kdur 40 x 1  8. DVT PPX  - heparin gtt    Desma Maxim MD PGY 2 IM Discussed with attending

## 2012-09-15 ENCOUNTER — Ambulatory Visit: Payer: Medicaid Other | Admitting: Vascular Surgery

## 2012-09-15 DIAGNOSIS — L03039 Cellulitis of unspecified toe: Secondary | ICD-10-CM

## 2012-09-15 DIAGNOSIS — I129 Hypertensive chronic kidney disease with stage 1 through stage 4 chronic kidney disease, or unspecified chronic kidney disease: Secondary | ICD-10-CM

## 2012-09-15 LAB — GLUCOSE, CAPILLARY: Glucose-Capillary: 219 mg/dL — ABNORMAL HIGH (ref 70–99)

## 2012-09-15 LAB — CBC
HCT: 26.1 % — ABNORMAL LOW (ref 39.0–52.0)
Hemoglobin: 8.2 g/dL — ABNORMAL LOW (ref 13.0–17.0)
MCV: 86.4 fL (ref 78.0–100.0)
Platelets: 131 10*3/uL — ABNORMAL LOW (ref 150–400)
RBC: 3.02 MIL/uL — ABNORMAL LOW (ref 4.22–5.81)
WBC: 9.3 10*3/uL (ref 4.0–10.5)

## 2012-09-15 LAB — RENAL FUNCTION PANEL
CO2: 23 mEq/L (ref 19–32)
Calcium: 9.1 mg/dL (ref 8.4–10.5)
Chloride: 99 mEq/L (ref 96–112)
GFR calc Af Amer: 19 mL/min — ABNORMAL LOW (ref >90)
GFR calc non Af Amer: 16 mL/min — ABNORMAL LOW (ref >90)
Sodium: 136 mEq/L (ref 135–145)

## 2012-09-15 LAB — HEPARIN LEVEL (UNFRACTIONATED): Heparin Unfractionated: <0.1 IU/mL — ABNORMAL LOW (ref 0.30–0.70)

## 2012-09-15 MED ORDER — SODIUM CHLORIDE 0.9 % IV SOLN: 100.0000 mL | INTRAVENOUS | Status: DC | PRN

## 2012-09-15 MED ORDER — HEPARIN SODIUM (PORCINE) 1000 UNIT/ML DIALYSIS: 20.0000 [IU]/kg | INTRAMUSCULAR | Status: DC | PRN

## 2012-09-15 MED ORDER — HEPARIN SODIUM (PORCINE) 1000 UNIT/ML DIALYSIS: 1000.0000 [IU] | INTRAMUSCULAR | Status: DC | PRN

## 2012-09-15 MED ORDER — HEPARIN (PORCINE) IN NACL 100-0.45 UNIT/ML-% IJ SOLN
2350.0000 [IU]/h | INTRAMUSCULAR | Status: DC
  Administered 2012-09-15 – 2012-09-17 (×4): 2350 [IU]/h via INTRAVENOUS
  Filled 2012-09-15 (×7): qty 250

## 2012-09-15 MED ORDER — ALTEPLASE 2 MG IJ SOLR
2.0000 mg | Freq: Once | INTRAMUSCULAR | Status: DC | PRN
  Filled 2012-09-15: qty 2

## 2012-09-15 MED ORDER — HEPARIN BOLUS VIA INFUSION
2000.0000 [IU] | Freq: Once | INTRAVENOUS | Status: AC
  Administered 2012-09-15: 2000 [IU] via INTRAVENOUS
  Filled 2012-09-15: qty 2000

## 2012-09-15 MED ORDER — DOXERCALCIFEROL 4 MCG/2ML IV SOLN
2.0000 ug | INTRAVENOUS | Status: DC
  Filled 2012-09-15 (×5): qty 2

## 2012-09-15 MED ORDER — WARFARIN SODIUM 7.5 MG PO TABS
7.5000 mg | ORAL_TABLET | Freq: Every day | ORAL | Status: DC
  Administered 2012-09-15 – 2012-09-16 (×2): 7.5 mg via ORAL
  Filled 2012-09-15 (×3): qty 1

## 2012-09-15 MED ORDER — WARFARIN SODIUM 5 MG PO TABS
5.0000 mg | ORAL_TABLET | Freq: Once | ORAL | Status: AC
  Administered 2012-09-15: 5 mg via ORAL
  Filled 2012-09-15: qty 1

## 2012-09-15 MED ORDER — HEPARIN BOLUS VIA INFUSION
2000.0000 [IU] | Freq: Once | INTRAVENOUS | Status: DC
  Filled 2012-09-15: qty 2000

## 2012-09-15 MED ORDER — WARFARIN - PHYSICIAN DOSING INPATIENT
Freq: Every day | Status: DC
  Administered 2012-09-16: 18:00:00

## 2012-09-15 MED ORDER — CEPHALEXIN 250 MG PO CAPS
250.0000 mg | ORAL_CAPSULE | Freq: Every day | ORAL | Status: AC
  Administered 2012-09-15 – 2012-09-24 (×10): 250 mg via ORAL
  Filled 2012-09-15 (×10): qty 1

## 2012-09-15 NOTE — Progress Notes (Signed)
Patient while in hd had a high heart rate in 130-150's.  Patient was not symptomatic.  Hd staff spoke with the nephrologist and they stated the plan was to take the patient off the hd machine.  This patient also having problems keeping bp up to get fluid off, did not get his lopressor prior to hd (as normal protocol so his bp doesn't drop on hd).  Patient returned to me with a hr of 130's therefore, I spoke with the pa with cardiology Ward Givens and he stated to give the metoprolol dose now and monitor.  The patient needs to be seen by cardiology in the morning prior to going to hd to see if the patient needs different meds for heartrate.  Hd staff aware to wait to go to hd until after cardiologist see.  Continue to monitor

## 2012-09-15 NOTE — Progress Notes (Signed)
Pt refused foot dressing change at this time. Refuses assessment of left foot. Per pt, will defer until the afternoon. Gilman Schmidt

## 2012-09-15 NOTE — Progress Notes (Signed)
I have seen and examined this patient and agree with plan as outlined by Dr. Shirlee Latch.  Will plan for HD today and tomorrow to help with UF. Will also use 4K bath to help prevent arrythmias with HD.  Start Hectoral qTTS and follow. Jaden Batchelder A,MD 09/15/2012 9:02 AM

## 2012-09-15 NOTE — Progress Notes (Signed)
Pt heart rate between 130's - 140 with 15 mins of tx left; Dr. Annie Sable ordered to stop tx and to give pt a beta blocker if the increased HR persist. Pt transported to his room by Mervin Hack, RN  And Cay Schillings, RN. Pt denies any pain, SOB, or dizziness. See Mervin Hack, RN from cardiology.

## 2012-09-15 NOTE — Progress Notes (Signed)
ANTICOAGULATION CONSULT NOTE - Follow Up Consult  Pharmacy Consult for Heparin  Indication: atrial fibrillation  Allergies  Allergen Reactions  . Ace Inhibitors     Acute renal failure with multiple trials    Patient Measurements: Height: 6\' 1"  (185.4 cm) Weight: 350 lb 15.6 oz (159.2 kg) IBW/kg (Calculated) : 79.9 Heparin Dosing Weight: 117 kg  Vital Signs: Temp: 98.6 F (37 C) (08/20 1328) Temp src: Oral (08/20 1328) BP: 99/56 mmHg (08/20 1328) Pulse Rate: 101 (08/20 1328)  Labs:  Recent Labs  09/13/12 1200 09/13/12 1824 09/13/12 2030 09/14/12 0430 09/14/12 1335 09/14/12 1457 09/14/12 2230 09/14/12 2350 09/15/12 0500  HGB 8.2*  --   --  8.2*  --   --   --   --  8.2*  HCT 26.8*  --   --  26.0*  --   --   --   --  26.1*  PLT 180  --   --  136*  --   --   --   --  131*  HEPARINUNFRC  --   --   --  <0.10* >2.20*  --   --  <0.10*  --   CREATININE 3.97*  --   --  3.51*  --  3.61*  --   --  4.04*  TROPONINI <0.30 <0.30 <0.30  --   --   --  <0.30  --   --     Estimated Creatinine Clearance: 35.3 ml/min (by C-G formula based on Cr of 4.04).   Medications:  Heparin at 2000 units/hr  Assessment: 47 y/o M on heparin for A-fib. Pt has limited access and must have short stoppage of heparin drip for draws. Pt has had two undetectable levels and one level that was >2.2 that was drawn while heparin was still infusing. Was supposed to get repeat HL at 0900 this AM that was not done, unsure of reason, spoke to RN, unsure as well. Pt is now in HD, so will order a HL for 2 hours after HD and ensure RN knows to have short pause for draw. Pt is also on warfarin bridge now per MD (day #1).   Goal of Therapy:  Heparin level 0.3-0.7 units/ml Monitor platelets by anticoagulation protocol: Yes   Plan:  -Continue heparin drip at 2000 units/hr -Cancel AM heparin level that was not drawn, re-schedule for 2000 (apprx 2 hours after HD) -Daily CBC/HL -Monitor for bleeding -Warfarin  bridge per MD (will still monitor INR)  Thank you for allowing me to take part in this patient's care,  Abran Duke, PharmD Clinical Pharmacist Phone: [phone] Pager: [phone] 09/15/2012 1:41 PM

## 2012-09-15 NOTE — Progress Notes (Addendum)
Pt has bp of 99/58, hr 92. Called MD to verify parameters for metoprolol. Pt also reports intermittent nasal bleeding since last night. Per Cater MD, okay to give to pt. Monitor BP and HR. Notify if SPB <90. Monitor bleeding if it becomes ongoing.  Gilman Schmidt

## 2012-09-15 NOTE — Progress Notes (Signed)
Pt had 9 beat run of vtach, nonsustained. Pt asymptomatic, resting in bed. No distress noted. Cater MD made aware. No new orders at this time. Gilman Schmidt

## 2012-09-15 NOTE — Progress Notes (Signed)
[facility] Progress NOTE    Subjective: Still sob intermittently. Denies chest pain, ab pain  RN: VT overnight   Current medications: Current Facility-Administered Medications  Medication Dose Route Frequency Provider Last Rate Last Dose  . 0.9 %  sodium chloride infusion  100 mL Intravenous PRN Lauris Poag, MD      . 0.9 %  sodium chloride infusion  100 mL Intravenous PRN Lauris Poag, MD      . 0.9 %  sodium chloride infusion  250 mL Intravenous PRN Na Dierdre Searles, MD      . acetaminophen (TYLENOL) tablet 650 mg  650 mg Oral Q6H PRN Jonah Blue, DO      . cephALEXin (KEFLEX) capsule 250 mg  250 mg Oral Q2200 Marjan Rabbani, MD      . diclofenac sodium (VOLTAREN) 1 % transdermal gel 2 g  2 g Topical QID PRN Jonah Blue, DO      . docusate sodium (COLACE) capsule 100 mg  100 mg Oral BID PRN Jonah Blue, DO      . febuxostat (ULORIC) tablet 80 mg  80 mg Oral Daily Jonah Blue, DO   80 mg at 09/14/12 0919  . feeding supplement (NEPRO CARB STEADY) liquid 237 mL  237 mL Oral PRN Lauris Poag, MD      . ferric gluconate (NULECIT) 125 mg in sodium chloride 0.9 % 100 mL IVPB  125 mg Intravenous Q T,Th,Sa-HD Irena Cords, MD      . heparin ADULT infusion 100 units/mL (25000 units/250 mL)  2,000 Units/hr Intravenous Continuous Colleen Can, RPH 20 mL/hr at 09/15/12 0123 2,000 Units/hr at 09/15/12 0123  . insulin aspart (novoLOG) injection 0-9 Units  0-9 Units Subcutaneous TID WC Linward Headland, MD   2 Units at 09/15/12 [phone]  . insulin glargine (LANTUS) injection 15 Units  15 Units Subcutaneous QHS Dede Query, MD   15 Units at 09/14/12 2122  . lidocaine (PF) (XYLOCAINE) 1 % injection 5 mL  5 mL Intradermal PRN Lauris Poag, MD      . lidocaine-prilocaine (EMLA) cream 1 application  1 application Topical PRN Lauris Poag, MD      . LORazepam (ATIVAN) tablet 0.5 mg  0.5 mg Oral BID PRN Jonah Blue, DO   0.5 mg at 09/11/12 1334  . metoprolol tartrate  (LOPRESSOR) tablet 12.5 mg  12.5 mg Oral QID Pricilla Riffle, MD   12.5 mg at 09/14/12 2122  . ondansetron (ZOFRAN) injection 4 mg  4 mg Intravenous PRN Marjan Rabbani, MD      . pantoprazole (PROTONIX) EC tablet 40 mg  40 mg Oral Daily Linward Headland, MD   40 mg at 09/14/12 0919  . pentafluoroprop-tetrafluoroeth (GEBAUERS) aerosol 1 application  1 application Topical PRN Lauris Poag, MD      . simvastatin (ZOCOR) tablet 40 mg  40 mg Oral QPM Linward Headland, MD   40 mg at 09/14/12 1749  . sodium chloride 0.9 % injection 3 mL  3 mL Intravenous Q12H Na Li, MD   3 mL at 09/14/12 2122  . sodium chloride 0.9 % injection 3 mL  3 mL Intravenous PRN Na Li, MD   3 mL at 09/10/12 1137  . traMADol (ULTRAM) tablet 50 mg  50 mg Oral Q12H PRN Dede Query, MD   50 mg at 09/12/12 2233  . warfarin (COUMADIN) tablet 7.5 mg  7.5 mg Oral q1800 Marinus Maw, MD      .  Warfarin - Physician Dosing Inpatient   Does not apply q1800 Herby Abraham, [facility]         Vital Signs: Blood pressure 104/70, pulse 99, temperature 99.5 F (37.5 C), temperature source Oral, resp. rate 18, height 6\' 1"  (1.854 m), weight 345 lb 0.3 oz (156.5 kg), SpO2 99.00%.  Weight trends: Filed Weights   09/13/12 2047 09/14/12 2023 09/15/12 0700  Weight: 342 lb 6 oz (155.3 kg) 342 lb 6 oz (155.3 kg) 345 lb 0.3 oz (156.5 kg)    Physical Exam: General: Vital signs reviewed and noted. Well-developed, obese, in no acute distress;  appropriate and cooperative throughout examination.  Head: Normocephalic, atraumatic.  Lungs:  Crackles b/l  Heart: RRR, no murmurs   Abdomen:  BS normoactive. Soft, obese, non-tender.    Extremities: B/l 1+ edema, no cyanosis, hyperpigmentation and thickening to lower extremities. Left great toe with wound  Neurologic: Alert and oriented x 3   Skin: hyperpigmentation and thickening to lower extremities    Lab results: Basic Metabolic Panel:  Recent Labs Lab 09/11/12 0631 09/13/12 1200 09/14/12 0430 09/14/12 1457  09/15/12 0500  NA 137 134* 136 135 136  K 3.6 3.7 3.4* 3.7 3.7  CL 103 99 99 99 99  CO2 20 21 22 20 23   GLUCOSE 99 147* 123* 148* 150*  BUN 83* 73* 57* 59* 65*  CREATININE 3.58* 3.97* 3.51* 3.61* 4.04*  CALCIUM 9.0 9.0 9.0 9.0 9.1  MG  --  2.1 2.0 2.0 1.9  PHOS 4.8* 4.1  --   --  3.5    Liver Function Tests:  Recent Labs Lab 09/09/12 1915  09/11/12 0631 09/13/12 1200 09/15/12 0500  AST 14  --   --   --   --   ALT 6  --   --   --   --   ALKPHOS 145*  --   --   --   --   BILITOT 1.7*  --   --   --   --   PROT 7.6  --   --   --   --   ALBUMIN 3.3*  < > 3.3* 3.1* 3.1*  < > = values in this interval not displayed.  CBC:  Recent Labs Lab 09/09/12 1915 09/11/12 0631 09/13/12 1200 09/14/12 0430 09/15/12 0500  WBC 6.7 6.9 6.4 7.3 9.3  HGB 8.7* 8.3* 8.2* 8.2* 8.2*  HCT 27.9* 26.2* 26.8* 26.0* 26.1*  MCV 85.3 85.6 86.5 85.8 86.4  PLT 230 189 180 136* 131*   CBG:  Recent Labs Lab 09/14/12 0721 09/14/12 1136 09/14/12 1612 09/14/12 2027 09/15/12 0805  GLUCAP 152* 139* 124* 168* 173*     Imaging: No results found.   Assessment & Plan: Pt is a 47 y.o. 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 He presented to the Internal Medicine Clinic with weight gain of 50 lbs during the last month (epic review shows weight 316-->350 lbs), increased shortness of breath, lower extremity edema and abdominal bloating.  He recently had a right AVF placed on 08/31/12.  1. Fluid overload secondary to end stage renal disease  -Renal following -Patient had HD Thursday, Saturday, Monday. Weds and Thursday -PTH 397, vitamin D 10-->consider Hectoral. Fe studies indicate Fe def. vitamin D level (  10) low -will get HD outpatient at [facility] -renal to  dose Aranesp in the future  -trend daily weights and i/o's   2. 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  CXR indicates patient has cardiomegaly, CHF and vascular congestion.  Will likely benefit from HD.  -Monitor strict i/o, daily weights  -BB (Metoprolol 12.5 mg qid), Continue statin, allergic to ACEI. -cardiology consulted per cards hold Dig with HD  3. Possible Atrial fibrillation  -on heparin gtt per cards, Coumadin.  Lopressor 12.5 mg qid   4. Diabetes 2 uncontrolled (HA1C 9.1 in 07/2012)  -hyperglycemia intermittently. primary team addressing  -Lantus 15 units qhs, SSI  5. Hypotension  -Etiology could be related to anemia, medications, heart failure history.  monitor BP still soft and dose of BB increased in freq   6. Left great toe infection -Continue Abx x 7 days total per ortho   7. F/E/N -renal diet   8. DVT PPX  - heparin gtt , Coumadin  Desma Maxim MD PGY 2 IM Discussed with attending

## 2012-09-15 NOTE — Progress Notes (Signed)
  Date: 09/15/2012  Patient name: Adam [name]  Medical record number: [id number]  Date of birth: 1965/05/14   This patient has been seen and the plan of care was discussed with the house staff. Please see their note for complete details. I concur with their findings with the following additions/corrections: Would touch base with cardiology regarding the need for heparin bridge to coumadin (necessary?).  He feels better today. Denies CP and SOB is better.  Will undergo HD today and tomorrow to remove more volume.  Episodes of VT, hemodynamically stable.  Rate is controlled.  Jonah Blue, DO, FACP Faculty [facility] Internal Medicine Residency Program 09/15/2012, 1:29 PM

## 2012-09-15 NOTE — Progress Notes (Signed)
Subjective:  Pt seen and examined. Per nursing note another episode of vtach early this AM. Pt reports no palpitations, CP, dyspnea, diaphoresis, nausea, vomiting, or abdominal pain. Pt reports edema is improving slowly. Plan is for HD today.    Objective: Vital signs in last 24 hours: Filed Vitals:   09/14/12 1426 09/14/12 1613 09/14/12 2023 09/15/12 0441  BP: 99/64 103/64 108/69 104/70  Pulse: 89 99 90 99  Temp: 98.5 F (36.9 C) 98.6 F (37 C) 98.5 F (36.9 C) 99.5 F (37.5 C)  TempSrc:   Oral Oral  Resp: 18 19 18 18   Height:   6\' 1"  (1.854 m)   Weight:   155.3 kg (342 lb 6 oz)   SpO2: 95% 94% 98% 99%   Weight change: -4.1 kg (-9 lb 0.6 oz)  Intake/Output Summary (Last 24 hours) at 09/15/12 1610 Last data filed at 09/15/12 0446  Gross per 24 hour  Intake   1400 ml  Output    200 ml  Net   1200 ml   Constitutional: Obese, NAD Head: Normocephalic Eyes: PERRL Neck: Supple Cardiovascular: RRR, S1 normal, S2 normal, pitting +3 edema b/l from calves to abdomen.  Pulmonary/Chest: Air entry equal bilaterally. Diffuse wheezing Abdominal: Soft. Non-tender, non-distended, bowel sounds are normal, no masses, organomegaly, or guarding present.  Musculoskeletal: No synovitis, effusions, or erythema of bilateral upper and lower  appendicular joints  Neurological: A&O x3 Skin: Chronic venous stasis changes of bilateral lower legs, w/ oozing lesion on right leg, left foot forefoot ulceration wrapped in gauze    Lab Results: Basic Metabolic Panel:  Recent Labs Lab 09/13/12 1200  09/14/12 1457 09/15/12 0500  NA 134*  < > 135 136  K 3.7  < > 3.7 3.7  CL 99  < > 99 99  CO2 21  < > 20 23  GLUCOSE 147*  < > 148* 150*  BUN 73*  < > 59* 65*  CREATININE 3.97*  < > 3.61* 4.04*  CALCIUM 9.0  < > 9.0 9.1  MG 2.1  < > 2.0 1.9  PHOS 4.1  --   --  3.5  < > = values in this interval not displayed. Liver Function Tests:  Recent Labs Lab 09/09/12 1915  09/13/12 1200  09/15/12 0500  AST 14  --   --   --   ALT 6  --   --   --   ALKPHOS 145*  --   --   --   BILITOT 1.7*  --   --   --   PROT 7.6  --   --   --   ALBUMIN 3.3*  < > 3.1* 3.1*  < > = values in this interval not displayed. No results found for this basename: LIPASE, AMYLASE,  in the last 168 hours No results found for this basename: AMMONIA,  in the last 168 hours CBC:  Recent Labs Lab 09/14/12 0430 09/15/12 0500  WBC 7.3 9.3  HGB 8.2* 8.2*  HCT 26.0* 26.1*  MCV 85.8 86.4  PLT 136* 131*   Cardiac Enzymes:  Recent Labs Lab 09/13/12 1824 09/13/12 2030 09/14/12 2230  TROPONINI <0.30 <0.30 <0.30   BNP: No results found for this basename: PROBNP,  in the last 168 hours D-Dimer: No results found for this basename: DDIMER,  in the last 168 hours CBG:  Recent Labs Lab 09/13/12 1633 09/13/12 2049 09/14/12 0721 09/14/12 1136 09/14/12 1612 09/14/12 2027  GLUCAP 104*  176* 152* 139* 124* 168*   Hemoglobin A1C: No results found for this basename: HGBA1C,  in the last 168 hours Fasting Lipid Panel: No results found for this basename: CHOL, HDL, LDLCALC, TRIG, CHOLHDL, LDLDIRECT,  in the last 168 hours Thyroid Function Tests: No results found for this basename: TSH, T4TOTAL, FREET4, T3FREE, THYROIDAB,  in the last 168 hours Coagulation: No results found for this basename: LABPROT, INR,  in the last 168 hours Anemia Panel:  Recent Labs Lab 09/13/12 1300  TIBC 406  IRON 19*   Urine Drug Screen: Drugs of Abuse     Component Value Date/Time   LABOPIA NEG 03/17/2006 1455   COCAINSCRNUR NEG 03/17/2006 1455   LABBENZ NEG 03/17/2006 1455   AMPHETMU NEG 03/17/2006 1455    Alcohol Level: No results found for this basename: ETH,  in the last 168 hours Urinalysis: No results found for this basename: COLORURINE, APPERANCEUR, LABSPEC, PHURINE, GLUCOSEU, HGBUR, BILIRUBINUR, KETONESUR, PROTEINUR, UROBILINOGEN, NITRITE, LEUKOCYTESUR,  in the last 168 hours Misc. Labs:   Micro  Results: No results found for this or any previous visit (from the past 240 hour(s)). Studies/Results: No results found. Medications: I have reviewed the patient's current medications. Scheduled Meds: . cephALEXin  250 mg Oral Q2200  . [START ON 09/16/2012] doxercalciferol  2 mcg Intravenous Q T,Th,Sa-HD  . febuxostat  80 mg Oral Daily  . ferric gluconate (FERRLECIT/NULECIT) IV  125 mg Intravenous Q T,Th,Sa-HD  . insulin aspart  0-9 Units Subcutaneous TID WC  . insulin glargine  15 Units Subcutaneous QHS  . metoprolol tartrate  12.5 mg Oral QID  . pantoprazole  40 mg Oral Daily  . simvastatin  40 mg Oral QPM  . sodium chloride  3 mL Intravenous Q12H  . warfarin  7.5 mg Oral q1800  . Warfarin - Physician Dosing Inpatient   Does not apply q1800   Continuous Infusions: . heparin 2,000 Units/hr (09/15/12 0123)   PRN Meds:.sodium chloride, sodium chloride, sodium chloride, acetaminophen, diclofenac sodium, docusate sodium, feeding supplement (NEPRO CARB STEADY), lidocaine (PF), lidocaine-prilocaine, LORazepam, ondansetron (ZOFRAN) IV, pentafluoroprop-tetrafluoroeth, sodium chloride, traMADol Assessment/Plan: Principal Problem:   CKD (chronic kidney disease) stage 4, GFR 15-29 ml/min Active Problems:   Type II or unspecified type diabetes mellitus with renal manifestations, uncontrolled(250.42)   DYSLIPIDEMIA   Hypopotassemia   Morbid obesity   ANXIETY   TOBACCO ABUSE   OBSTRUCTIVE SLEEP APNEA   HYPERTENSION   CARDIOMYOPATHY   Chronic systolic heart failure   GERD   Gastric ulcer   Leg swelling   Knee pain   ICD (implantable cardioverter-defibrillator), single, in situ   Normocytic anemia   Open wound of foot   End stage renal disease   #CKD Stage 4 to ESRD with severe volume overload refractory to diuretic therapy necessitating emergent HD, with right arm AV cimino fistula and perm cath last week  -HD 8/14 -  tolerated well, 4L off, catheter running high pressure, episode of  hypotension and tachycardia - asymptomatic -HD 8/16 - tolerated well, 3L off  -HD 8/18 - tolerated well, 4L off -On TTS schedule - however per cardiology needs HD today as well  -Cr downtrending  -Continue renal diet, 1.2L fluid restriction per nephrology recs  -Monitor for hyperkalemia     Normocytic Anemia - due to CKD,  -To receive weekly erythropoetin injection, target Hct 33-36% -Iron deficiency (19), TIBC WNL, Iron sat low, to receive IV iron infusion with HD  -Monitor Potassium - 3.4 on 8/14 now  WNL     Hyperphosphatemia -  Resolved, 4.8 on  8/16 - Phosphate restriction 1g/d  -Consider phosphate binder therapy with meals  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 -elevated PTH,  Corrected calcium WNL, vitamin D deficiency    # Vtach in setting of tachycardia - asymptomatic, stable, on 8/18  HR 130s, SBP in 90s, 3 episodes of Vtach 8/18  1 episode on 8/19 and 8/20    -Stat 12-lead EKG 8/20 - normal sinus, no ischemic changes, prior EKG with 1st degree AV block -No firing of AICD - no interrogation needed per cards -Stat BMP and Mg levels - WNL -Orthostatic labs - negative  -Bolus as needed if SBP<90 -Cardiology consult -  PO  metprolol 12.5mg  x4 daily, hold digoxin -Per cardiology recs - on Heparin and bridge to coumadin until therapeutic INR  #Nausea and Vomiting - improved, nausea w/o vomiting or  abdominal pain, normal BM, likely due to HD and electrolyte/fluid shift;  started 8/15, 1 episode of green emesis on 8/16 and again 8/17 after eating   -Zofran as needed for nausea  Left forefoot wound with paronychia infection of left great toe  - 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 Left great toe with what appears to be an infected paronychia, recommend I & D of paronychia and  antibiotics -Obtain Xray left foot  - no osteomyelitis  -Obtain ABI - R 1.41 L 1.39 - suggesting calcified vessels, waveforms of b/l LE WNL -Ortho consult - Per Dr. Eulah Pont no surgical debridement at this time, continue wound care, antibiotics - will transition IV ancef to oral keflex 250mg  BID for 6 days, day 1 today     # Somnolence - improving. Pt reports increased sleepiness after starting HD  --ABG on 8/15- hypoxia, hypocapnia, low bicarbonate - D/c narcotics    #DM c/b dyslipidemia - last HbA1c 9.1 on 07/27/2012  -CBG WNL today - Lantus 15 U q pm  - SSI for meals  - Continue home statin  - Lipid panel 03/2012 - low HDL (26)  # Acute on chronic CHF with EF of 25% - Nonischemic cardiomyopathy with AICD (2011)  -Obtain cardiology consult - PO  metprolol 12.5mg  x4 daily, hold digoxin, on heparin and bridge to coumadin until therapeutic INR - CXR (8/15) --> cardiomegaly and pulmonary edema suggesting CHF, no pleural effusion   - Monitor on telemetry  #OSA  - CPAP at  night    # Anxiety  - Ativan 0.5 mg prn   #Knee Pain  - Tylenold prn for mild pain  - Tramadol,  Oxycodone AS NEEDED due to somnolence and hypotension    - Voltaren Gel   # Gout  - Febuxostat   #GERD  - Continue home PPI   # Prophylaxis  - IV heparin      Dispo: Disposition is deferred at this time, awaiting improvement of current medical problems.  Anticipated discharge in approximately  day(s).   The patient does have a current PCP Lars Masson, MD) and does need an [facility] hospital follow-up appointment after discharge.  The patient does have transportation limitations that hinder transportation to clinic appointments.  .Services Needed at time of discharge: Y = Yes, Blank = No PT:   OT:   RN:   Equipment:   Other:  LOS: 6 days   Otis Brace, MD 09/15/2012, 7:07 AM

## 2012-09-15 NOTE — Progress Notes (Signed)
Patient ID: Adam [name], male   DOB: 12-25-1965, 47 y.o.   MRN: [id number] Subjective:  Dyspnea improving  Objective:  Vital Signs in the last 24 hours: Temp:  [98.2 F (36.8 C)-99.5 F (37.5 C)] 99.5 F (37.5 C) (08/20 0441) Pulse Rate:  [73-99] 99 (08/20 0441) Resp:  [18-20] 18 (08/20 0441) BP: (99-110)/(60-70) 104/70 mmHg (08/20 0441) SpO2:  [90 %-99 %] 99 % (08/20 0441) Weight:  [342 lb 6 oz (155.3 kg)] 342 lb 6 oz (155.3 kg) (08/19 2023)  Intake/Output from previous day: 08/19 0701 - 08/20 0700 In: 1400 [P.O.:1400] Out: 200 [Urine:200] Intake/Output from this shift:    Physical Exam: Well appearing NAD HEENT: Unremarkable Neck:  No JVD, no thyromegally Back:  No CVA tenderness Lungs:  Clear except for basilar rales HEART:  IRegular rate rhythm, no murmurs, no rubs, no clicks Abd:  soft, positive bowel sounds, no organomegally, no rebound, no guarding Ext:  2 plus pulses, no edema, no cyanosis, no clubbing Skin:  No rashes no nodules Neuro:  CN II through XII intact, motor grossly intact  Lab Results:  Recent Labs  09/14/12 0430 09/15/12 0500  WBC 7.3 9.3  HGB 8.2* 8.2*  PLT 136* 131*    Recent Labs  09/14/12 1457 09/15/12 0500  NA 135 136  K 3.7 3.7  CL 99 99  CO2 20 23  GLUCOSE 148* 150*  BUN 59* 65*  CREATININE 3.61* 4.04*    Recent Labs  09/13/12 2030 09/14/12 2230  TROPONINI <0.30 <0.30   Hepatic Function Panel  Recent Labs  09/15/12 0500  ALBUMIN 3.1*   No results found for this basename: CHOL,  in the last 72 hours No results found for this basename: PROTIME,  in the last 72 hours  Imaging: No results found.  Cardiac Studies: Tele - atrial fib with a controlled VR Assessment/Plan:  1. Acute on chronic systolic heart failure 2. ESRD on HD 3. PAF on heparin 4. Morbid obesity 5. S/p initiation of HD, awaiting maturation of fistula 6.VT - his ICD is in place. No recent shocks Rec: he will need coumadin long term. He still  has extra volume and will need more HD. His rate is controlled in atrial fibrillation. His only medication of option is amiodarone for rhythm control. I would not be inclined to use amio at this time.   LOS: 6 days    Terricka Onofrio,M.D. 09/15/2012, 7:12 AM

## 2012-09-15 NOTE — Progress Notes (Addendum)
ANTICOAGULATION CONSULT NOTE - Follow Up Consult  Pharmacy Consult for Heparin  Indication: atrial fibrillation  Allergies  Allergen Reactions  . Ace Inhibitors     Acute renal failure with multiple trials    Patient Measurements: Height: 6\' 1"  (185.4 cm) Weight: 343 lb 11.2 oz (155.9 kg) IBW/kg (Calculated) : 79.9 Heparin Dosing Weight: 117 kg  Vital Signs: Temp: 100 F (37.8 C) (08/20 2100) Temp src: Oral (08/20 2100) BP: 99/58 mmHg (08/20 2100) Pulse Rate: 93 (08/20 2100)  Labs:  Recent Labs  09/13/12 1200 09/13/12 1824 09/13/12 2030  09/14/12 0430 09/14/12 1335 09/14/12 1457 09/14/12 2230 09/14/12 2350 09/15/12 0500 09/15/12 2057  HGB 8.2*  --   --   --  8.2*  --   --   --   --  8.2*  --   HCT 26.8*  --   --   --  26.0*  --   --   --   --  26.1*  --   PLT 180  --   --   --  136*  --   --   --   --  131*  --   HEPARINUNFRC  --   --   --   < > <0.10* >2.20*  --   --  <0.10*  --  0.12*  CREATININE 3.97*  --   --   --  3.51*  --  3.61*  --   --  4.04*  --   TROPONINI <0.30 <0.30 <0.30  --   --   --   --  <0.30  --   --   --   < > = values in this interval not displayed.  Estimated Creatinine Clearance: 34.9 ml/min (by C-G formula based on Cr of 4.04).   Medications:  Heparin at 2000 units/hr  Assessment: 47 y/o M on heparin for A-fib. Pt has limited access and must have short stoppage of heparin drip for draws.   Pt has had two undetectable levels and current level is low at 0.12-unsure how accurate these levels are as heparin is having to be held prior to draw as must draw from line as infusing. Unable to draw level below where heparin infusion due to line being in the patient's hand. Will be conservative in increases but also do not want patient to remain sub-therapeutic.   Pt is also on warfarin bridge now per MD (day #1).   10:01 PM- RN called back. Patient is having nosebleeds on and off. This is only small amounts and likely attributed to oxygen he is  receiving through his nose.   Goal of Therapy:  Heparin level 0.3-0.7 units/ml Monitor platelets by anticoagulation protocol: Yes   Plan:  - (deleting bolus due to nose bleeds to be safe)continue with increase drip to 2350 units/hr (3 units/kg/hr).  -Recheck heparin level in 8 hours (AM labs)- if draw from same arm HEPARIN MUST BE PAUSED 15-20 minutes before this is drawn. Consider drawing from opposite arm or foot if allowed.  -Daily CBC/HL -Monitor for bleeding -Warfarin bridge per MD (will still monitor INR)  Thank you for allowing me to take part in this patient's care,  Link Snuffer, PharmD, BCPS Clinical Pharmacist [phone] 09/15/2012 9:34 PM

## 2012-09-15 NOTE — Progress Notes (Signed)
Pt deferred dressing change at this time. Per pt, "Do it later." Will continue to monitor. Gilman Schmidt

## 2012-09-15 NOTE — Progress Notes (Signed)
ANTICOAGULATION CONSULT NOTE - Follow Up Consult  Pharmacy Consult for heparin Indication: atrial fibrillation  Labs:  Recent Labs  09/13/12 1200 09/13/12 1824 09/13/12 2030 09/14/12 0430 09/14/12 1335 09/14/12 1457 09/14/12 2230 09/14/12 2350  HGB 8.2*  --   --  8.2*  --   --   --   --   HCT 26.8*  --   --  26.0*  --   --   --   --   PLT 180  --   --  136*  --   --   --   --   HEPARINUNFRC  --   --   --  <0.10* >2.20*  --   --  <0.10*  CREATININE 3.97*  --   --  3.51*  --  3.61*  --   --   TROPONINI <0.30 <0.30 <0.30  --   --   --  <0.30  --     Assessment: 47yo male now undetectable on heparin after level reported as >2.2, gtt held until second level reported.  Prior lab likely affected because having to draw lab from where heparin is infusing; management on heparin will continue to be difficult due to limited access for lab draws.  Goal of Therapy:  Heparin level 0.3-0.7 units/ml   Plan:  Will rebolus with heparin 2000 units x1 and resume gtt at 2000 units/hr and check level in 8hr.  Vernard Gambles, PharmD, BCPS  09/15/2012,1:16 AM

## 2012-09-16 DIAGNOSIS — K219 Gastro-esophageal reflux disease without esophagitis: Secondary | ICD-10-CM

## 2012-09-16 LAB — RENAL FUNCTION PANEL
Albumin: 3 g/dL — ABNORMAL LOW (ref 3.5–5.2)
CO2: 23 mEq/L (ref 19–32)
Calcium: 8.8 mg/dL (ref 8.4–10.5)
Creatinine, Ser: 3.48 mg/dL — ABNORMAL HIGH (ref 0.50–1.35)
GFR calc non Af Amer: 19 mL/min — ABNORMAL LOW (ref >90)
Phosphorus: 2.9 mg/dL (ref 2.3–4.6)

## 2012-09-16 LAB — HEPARIN LEVEL (UNFRACTIONATED): Heparin Unfractionated: <0.1 IU/mL — ABNORMAL LOW (ref 0.30–0.70)

## 2012-09-16 LAB — CBC
HCT: 25.8 % — ABNORMAL LOW (ref 39.0–52.0)
Hemoglobin: 8 g/dL — ABNORMAL LOW (ref 13.0–17.0)
MCV: 86.3 fL (ref 78.0–100.0)
RBC: 2.99 MIL/uL — ABNORMAL LOW (ref 4.22–5.81)
WBC: 9.9 10*3/uL (ref 4.0–10.5)

## 2012-09-16 LAB — PROTIME-INR
INR: 1.37 (ref 0.00–1.49)
Prothrombin Time: 16.5 seconds — ABNORMAL HIGH (ref 11.6–15.2)

## 2012-09-16 MED ORDER — ALBUMIN HUMAN 25 % IV SOLN
INTRAVENOUS | Status: AC
  Administered 2012-09-16: 12.5 g via INTRAVENOUS
  Filled 2012-09-16: qty 50

## 2012-09-16 MED ORDER — HEPARIN SODIUM (PORCINE) 1000 UNIT/ML DIALYSIS: 20.0000 [IU]/kg | INTRAMUSCULAR | Status: DC | PRN

## 2012-09-16 MED ORDER — SODIUM CHLORIDE 0.9 % IV SOLN: 100.0000 mL | INTRAVENOUS | Status: DC | PRN

## 2012-09-16 MED ORDER — HEPARIN SODIUM (PORCINE) 1000 UNIT/ML DIALYSIS: 1000.0000 [IU] | INTRAMUSCULAR | Status: DC | PRN

## 2012-09-16 MED ORDER — DOXERCALCIFEROL 4 MCG/2ML IV SOLN
INTRAVENOUS | Status: AC
  Administered 2012-09-16: 2 ug via INTRAVENOUS
  Filled 2012-09-16: qty 2

## 2012-09-16 MED ORDER — ALTEPLASE 2 MG IJ SOLR: 2.0000 mg | Freq: Once | INTRAMUSCULAR | Status: DC | PRN

## 2012-09-16 MED ORDER — ALBUMIN HUMAN 25 % IV SOLN: 12.5000 g | Freq: Once | INTRAVENOUS | Status: AC

## 2012-09-16 MED ORDER — METOPROLOL TARTRATE 25 MG PO TABS
25.0000 mg | ORAL_TABLET | Freq: Four times a day (QID) | ORAL | Status: DC
  Administered 2012-09-16 (×3): 25 mg via ORAL
  Filled 2012-09-16 (×7): qty 1

## 2012-09-16 MED ORDER — NEPRO/CARBSTEADY PO LIQD: 237.0000 mL | ORAL | Status: DC | PRN

## 2012-09-16 NOTE — Progress Notes (Signed)
Patient ID: Adam [name], male   DOB: [birth year], 47 y.o.   MRN: [id number] Subjective:  "sometimes I get hot and cannot breathe well."  Objective:  Vital Signs in the last 24 hours: Temp:  [98.5 F (36.9 C)-100 F (37.8 C)] 98.9 F (37.2 C) (08/21 1001) Pulse Rate:  [93-140] 114 (08/21 1001) Resp:  [18-29] 18 (08/21 1001) BP: (91-159)/(56-95) 107/72 mmHg (08/21 1001) SpO2:  [96 %-100 %] 98 % (08/21 1001) Weight:  [343 lb 11.2 oz (155.9 kg)-350 lb 15.6 oz (159.2 kg)] 343 lb 11.2 oz (155.9 kg) (08/20 2100)  Intake/Output from previous day: 08/20 0701 - 08/21 0700 In: 1993.1 [P.O.:960; I.V.:1033.1] Out: 3143 [Urine:250] Intake/Output from this shift: Total I/O In: 480 [P.O.:480] Out: -   Physical Exam: obese appearing middle aged man, NAD HEENT: Unremarkable Neck:  7 cm JVD, no thyromegally Back:  No CVA tenderness Lungs:  Clear with no wheezes, rare scattered rales HEART:  IRegular rate rhythm, no murmurs, no rubs, no clicks Abd:  obese, positive bowel sounds, no organomegally, no rebound, no guarding Ext:  2 plus pulses, 3+ woody edema, no cyanosis, no clubbing Skin:  No rashes no nodules Neuro:  CN II through XII intact, motor grossly intact  Lab Results:  Recent Labs  09/15/12 0500 09/16/12 0525  WBC 9.3 9.9  HGB 8.2* 8.0*  PLT 131* 94*    Recent Labs  09/15/12 0500 09/16/12 0905  NA 136 134*  K 3.7 3.7  CL 99 98  CO2 23 23  GLUCOSE 150* 200*  BUN 65* 48*  CREATININE 4.04* 3.48*    Recent Labs  09/13/12 2030 09/14/12 2230  TROPONINI <0.30 <0.30   Hepatic Function Panel  Recent Labs  09/16/12 0905  ALBUMIN 3.0*   No results found for this basename: CHOL,  in the last 72 hours No results found for this basename: PROTIME,  in the last 72 hours  Imaging: No results found.  Cardiac Studies: Tele - atrial fib/flutter with an RVR Assessment/Plan:  1. Uncontrolled atrial fib - will increase dose of metoprolol 2. ESRD on HD - note plans for  HD. We would like to see more fluid removed but his venous insufficiency and lymphedema make pulling fluid from lower extremities more difficult. 3. Acute on chronic systolic/diastolic CHF - he is still extravascularly fluid overloaded. Continue HD and low sodium diet. 4. coags - he is no iv heparin and coumadin, awaiting increase in iNR to greater than 2.   LOS: 7 days    Kanon Colunga,M.D. 09/16/2012, 12:04 PM

## 2012-09-16 NOTE — Progress Notes (Signed)
Subjective:  Pt seen and examined. Tolerated dialysis yesterday, per patient with tachycardia again and stopped 15 minutes earlier. No CP , dyspnea, palpitations, diaphoresis, nausea, vomiting, or abdominal pain.  Swelling slowly improving.   Objective: Vital signs in last 24 hours: Filed Vitals:   09/15/12 1631 09/15/12 1800 09/15/12 2100 09/16/12 0554  BP: 119/72 91/70 99/58  102/64  Pulse: 138 138 93 112  Temp: 99.1 F (37.3 C) 98.5 F (36.9 C) 100 F (37.8 C) 99.6 F (37.6 C)  TempSrc: Oral Oral Oral Oral  Resp: 20 20 20 20   Height:   6\' 1"  (1.854 m)   Weight: 156.1 kg (344 lb 2.2 oz)  155.9 kg (343 lb 11.2 oz)   SpO2: 100% 98% 98% 98%   Weight change: 3.9 kg (8 lb 9.6 oz)  Intake/Output Summary (Last 24 hours) at 09/16/12 4098 Last data filed at 09/16/12 0555  Gross per 24 hour  Intake    960 ml  Output   3143 ml  Net  -2183 ml   Constitutional: Obese, NAD Head: Normocephalic Eyes: PERRL Neck: Supple Cardiovascular: tachycardia, RRR, S1 normal, S2 normal, pitting +2 edema b/l from calves to abdomen.  Pulmonary/Chest: Air entry equal bilaterally. Diffuse wheezing Abdominal: Soft. Non-tender, non-distended, bowel sounds are normal, no masses, organomegaly, or guarding present.  Musculoskeletal: No synovitis, effusions, or erythema of bilateral upper and lower  appendicular joints  Neurological: A&O x3 Skin: Chronic venous stasis changes of bilateral lower legs, w/ oozing lesion on right leg, left foot forefoot ulceration wrapped in gauze    Lab Results: Basic Metabolic Panel:  Recent Labs Lab 09/13/12 1200  09/14/12 1457 09/15/12 0500  NA 134*  < > 135 136  K 3.7  < > 3.7 3.7  CL 99  < > 99 99  CO2 21  < > 20 23  GLUCOSE 147*  < > 148* 150*  BUN 73*  < > 59* 65*  CREATININE 3.97*  < > 3.61* 4.04*  CALCIUM 9.0  < > 9.0 9.1  MG 2.1  < > 2.0 1.9  PHOS 4.1  --   --  3.5  < > = values in this interval not displayed. Liver Function Tests:  Recent  Labs Lab 09/09/12 1915  09/13/12 1200 09/15/12 0500  AST 14  --   --   --   ALT 6  --   --   --   ALKPHOS 145*  --   --   --   BILITOT 1.7*  --   --   --   PROT 7.6  --   --   --   ALBUMIN 3.3*  < > 3.1* 3.1*  < > = values in this interval not displayed. No results found for this basename: LIPASE, AMYLASE,  in the last 168 hours No results found for this basename: AMMONIA,  in the last 168 hours CBC:  Recent Labs Lab 09/14/12 0430 09/15/12 0500  WBC 7.3 9.3  HGB 8.2* 8.2*  HCT 26.0* 26.1*  MCV 85.8 86.4  PLT 136* 131*   Cardiac Enzymes:  Recent Labs Lab 09/13/12 1824 09/13/12 2030 09/14/12 2230  TROPONINI <0.30 <0.30 <0.30   BNP: No results found for this basename: PROBNP,  in the last 168 hours D-Dimer: No results found for this basename: DDIMER,  in the last 168 hours CBG:  Recent Labs Lab 09/14/12 1612 09/14/12 2027 09/15/12 0805 09/15/12 1142 09/15/12 1717 09/15/12 2103  GLUCAP 124* 168* 173* 164*  187* 219*   Hemoglobin A1C: No results found for this basename: HGBA1C,  in the last 168 hours Fasting Lipid Panel: No results found for this basename: CHOL, HDL, LDLCALC, TRIG, CHOLHDL, LDLDIRECT,  in the last 168 hours Thyroid Function Tests: No results found for this basename: TSH, T4TOTAL, FREET4, T3FREE, THYROIDAB,  in the last 168 hours Coagulation: No results found for this basename: LABPROT, INR,  in the last 168 hours Anemia Panel:  Recent Labs Lab 09/13/12 1300  TIBC 406  IRON 19*   Urine Drug Screen: Drugs of Abuse     Component Value Date/Time   LABOPIA NEG 03/17/2006 1455   COCAINSCRNUR NEG 03/17/2006 1455   LABBENZ NEG 03/17/2006 1455   AMPHETMU NEG 03/17/2006 1455    Alcohol Level: No results found for this basename: ETH,  in the last 168 hours Urinalysis: No results found for this basename: COLORURINE, APPERANCEUR, LABSPEC, PHURINE, GLUCOSEU, HGBUR, BILIRUBINUR, KETONESUR, PROTEINUR, UROBILINOGEN, NITRITE, LEUKOCYTESUR,  in the  last 168 hours Misc. Labs:   Micro Results: No results found for this or any previous visit (from the past 240 hour(s)). Studies/Results: No results found. Medications: I have reviewed the patient's current medications. Scheduled Meds: . cephALEXin  250 mg Oral Q2200  . doxercalciferol  2 mcg Intravenous Q T,Th,Sa-HD  . febuxostat  80 mg Oral Daily  . ferric gluconate (FERRLECIT/NULECIT) IV  125 mg Intravenous Q T,Th,Sa-HD  . insulin aspart  0-9 Units Subcutaneous TID WC  . insulin glargine  15 Units Subcutaneous QHS  . metoprolol tartrate  12.5 mg Oral QID  . pantoprazole  40 mg Oral Daily  . simvastatin  40 mg Oral QPM  . sodium chloride  3 mL Intravenous Q12H  . warfarin  7.5 mg Oral q1800  . Warfarin - Physician Dosing Inpatient   Does not apply q1800   Continuous Infusions: . heparin 2,350 Units/hr (09/16/12 0635)   PRN Meds:.sodium chloride, sodium chloride, sodium chloride, acetaminophen, diclofenac sodium, docusate sodium, lidocaine (PF), lidocaine-prilocaine, LORazepam, ondansetron (ZOFRAN) IV, pentafluoroprop-tetrafluoroeth, sodium chloride, traMADol Assessment/Plan: Principal Problem:   CKD (chronic kidney disease) stage 4, GFR 15-29 ml/min Active Problems:   Type II or unspecified type diabetes mellitus with renal manifestations, uncontrolled(250.42)   DYSLIPIDEMIA   Hypopotassemia   Morbid obesity   ANXIETY   TOBACCO ABUSE   OBSTRUCTIVE SLEEP APNEA   HYPERTENSION   CARDIOMYOPATHY   Chronic systolic heart failure   GERD   Gastric ulcer   Leg swelling   Knee pain   ICD (implantable cardioverter-defibrillator), single, in situ   Normocytic anemia   Open wound of foot   End stage renal disease   #CKD Stage 4 to ESRD with severe volume overload refractory to diuretic therapy necessitating emergent HD, with right arm AV cimino fistula and perm cath last week  -HD 8/14 -  tolerated well, 4L off, catheter running high pressure, episode of hypotension and  tachycardia - asymptomatic -HD 8/16 - tolerated well, 3L off  -HD 8/18 - tolerated well, 4L off -HD 8/20 - tacyhcardia,  2.9L off -HD 8/21-  Erratic HR, bp low 100s,  2.9L off  -On TTS schedule  -Cr downtrending  - Carb restricted diet with 1.0L fluid restriction  -Continue daily weight - 20 lb since admission -Monitor for hyperkalemia  -PT - home health PT recommended    Normocytic Anemia - due to CKD,  -To receive weekly erythropoetin injection, target Hct 33-36% -Iron deficiency (19), TIBC WNL, Iron sat low, to receive IV  iron infusion with HD  -Monitor Potassium - 3.4 on 8/14 now WNL     Hyperphosphatemia -  Resolved, 4.8 on  8/16 - Phosphate restriction 1g/d  -Consider phosphate binder therapy with meals  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 -elevated PTH,  Corrected calcium WNL, vitamin D deficiency  -Per nephro to receive Hectoral (doxercalciferol)  # Atrial Fibrallation -  asymptomatic, hemodynamically stable on 8/18  HR 130s, SBP in 90s, 3 episodes of Vtach 8/18  1 episode on 8/19 and 8/20  -Tele with atrial fib/flutter with RVR   -No firing of AICD - no interrogation needed per cards -BMP and Mg levels - WNL -Orthostatic labs - negative  -Bolus as needed if SBP<90 -Cardiology consult -  PO  metprolol 12.5mg  x4 daily, hold digoxin -Per cardiology recs - on Heparin and bridge to coumadin until therapeutic INR (>2) due to CHAD2 score of 3 (CHF, HTN, DM) high risk of thromboembolic event (5.9% risk of stroke per year if no coumadin), monitor for bleeding    # Acute on chronic CHF with EF of 25% - Nonischemic cardiomyopathy with AICD (2011)  -Obtain cardiology consult - PO  metprolol 12.5mg  x4 daily, hold digoxin, on heparin and bridge to coumadin until therapeutic INR - CXR (8/15) --> cardiomegaly and pulmonary edema suggesting CHF, no pleural effusion   - Monitor on telemetry -Continue HD to remove excess fluid   Left forefoot wound with paronychia infection of  left great toe  - 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 Left great toe with what appears to be an infected paronychia, recommend I & D of paronychia and antibiotics -Obtain Xray left foot  - no osteomyelitis  -Obtain ABI - R 1.41 L 1.39 - suggesting calcified vessels, waveforms of b/l LE WNL -Ortho consult - Per Dr. Eulah Pont no surgical debridement at this time, continue wound care, antibiotics -oral keflex 250mg  BID for 5 days, day 2 today   - Per Dr. Lajoyce Corners - wound care nurses to apply a Profore wrap 4 both lower extremities. To be d/c with compression wraps and f/u in office for wound care . -Pending  vascular lab venous duplex to confirm venous incompetence  #Nausea and Vomiting -resolved. Nausea w/o vomiting or  abdominal pain, normal BM, likely due to HD and electrolyte/fluid shift;  started 8/15, 1 episode of green emesis on 8/16 and again 8/17 after eating   -Zofran as needed for nausea    # Somnolence - resolved. Pt reports increased sleepiness after starting HD  --ABG on 8/15- hypoxia, hypocapnia, low bicarbonate - D/c narcotics    #DM c/b dyslipidemia - last HbA1c 9.1 on 07/27/2012  -CBG WNL today - Lantus 15 U q pm  - SSI for meals  - Continue home statin  - Lipid panel 03/2012 - low HDL (26)  #OSA  - CPAP at  night    # Anxiety  - Ativan 0.5 mg prn   #Knee Pain  - Tylenold prn for mild pain  - Tramadol,  Oxycodone AS NEEDED due to somnolence and hypotension    - Voltaren Gel   # Gout  - Febuxostat   #GERD  - Continue home PPI   # Prophylaxis  - IV heparin      Dispo: Disposition is deferred at this time, awaiting improvement of  current medical problems.  Anticipated discharge in approximately  day(s).   The patient does have a current PCP Lars Masson, MD) and does  need an [facility] hospital follow-up appointment after discharge.  The patient does have transportation limitations that hinder transportation to clinic appointments.  .Services Needed at time of discharge: Y = Yes, Blank = No PT:   OT:   RN:   Equipment:   Other:     LOS: 7 days   Otis Brace, MD 09/16/2012, 6:59 AM

## 2012-09-16 NOTE — Progress Notes (Signed)
I have seen and examined this patient and agree with plan as outlined by Dr. Shirlee Latch.  Will plan for HD again today and cont with TTS schedule.  Will cont to challenge EDW while he remains an inpt.  Await dispo per Cards. Riccardo Holeman A,MD 09/16/2012 9:23 AM

## 2012-09-16 NOTE — Progress Notes (Signed)
Physical Therapy Treatment Patient Details Name: Adam [name] MRN: [id number] DOB: 1965/06/01 Today's Date: 09/16/2012 Time: 4098-1191 PT Time Calculation (min): 16 min  PT Assessment / Plan / Recommendation  History of Present Illness Pt adm to [facility] due to 50lb weight gain in less than a month, pt admits to increased SOB recenty and Lt LE pain/swelling from fluid retention. Pt with unctrolled diabetes and cardiomyopathy.     PT Comments   Pt reports he ambulated yesterday and declined hallway ambulation today (only inside room) due to not wanting to be fatigued for HD today.  Pt very close to meeting goals and would like PT to check back on Monday if still in acute care.   Follow Up Recommendations  Home health PT;Supervision for mobility/OOB     Does the patient have the potential to tolerate intense rehabilitation     Barriers to Discharge        Equipment Recommendations  None recommended by PT    Recommendations for Other Services    Frequency     Progress towards PT Goals Progress towards PT goals: Progressing toward goals  Plan Current plan remains appropriate    Precautions / Restrictions Precautions Precautions: None   Pertinent Vitals/Pain L foot pain (not rated), pt limited activity    Mobility  Bed Mobility Details for Bed Mobility Assistance: sitting EOB on arrival Transfers Transfers: Sit to Stand;Stand to Sit Sit to Stand: From bed;With upper extremity assist;5: Supervision Stand to Sit: 5: Supervision;To bed;With upper extremity assist Details for Transfer Assistance: cues for hand placement and safety; pt attempts to pull up on RW for sit to stand  Ambulation/Gait Ambulation/Gait Assistance: 5: Supervision Ambulation Distance (Feet): 20 Feet Assistive device: Rolling walker Ambulation/Gait Assistance Details: pt did not wish to ambulate in hallway and only ambulate to/from doorway/bed as he reports he did not want to be fatigued for HD tx later  today Gait Pattern: Step-to pattern;Decreased stance time - left;Decreased step length - right;Wide base of support;Trunk flexed Gait velocity: decreased due to pain  General Gait Details: antalgic gait due to pain in L foot, pt also reporting gout in L foot as well Stairs: No Wheelchair Mobility Wheelchair Mobility: No    Exercises     PT Diagnosis:    PT Problem List:   PT Treatment Interventions:     PT Goals (current goals can now be found in the care plan section)    Visit Information  Last PT Received On: 09/16/12 Assistance Needed: +1 History of Present Illness: Pt adm to [facility] due to 50lb weight gain in less than a month, pt admits to increased SOB recenty and Lt LE pain/swelling from fluid retention. Pt with unctrolled diabetes and cardiomyopathy.      Subjective Data      Cognition  Cognition Arousal/Alertness: Awake/alert Behavior During Therapy: WFL for tasks assessed/performed Overall Cognitive Status: Within Functional Limits for tasks assessed    Balance     End of Session PT - End of Session Activity Tolerance: Patient limited by fatigue Patient left: in bed;with call bell/phone within reach   GP     Nastassia Bazaldua,KATHrine E 09/16/2012, 11:02 AM Zenovia Jarred, PT, DPT 09/16/2012 Pager: [phone]

## 2012-09-16 NOTE — Procedures (Signed)
I was present at this session.  I have reviewed the session itself and made appropriate changes.  HR erratic. bp low 100s.  bfr 350. Ap an VP ok  Misako Roeder L 8/21/20144:25 PM

## 2012-09-16 NOTE — Progress Notes (Deleted)
Pt deferred wound care assessment and dressing of left foot at this time. Per pt,  "Change it in the morning." Gilman Schmidt

## 2012-09-16 NOTE — Consult Note (Signed)
Reason for Consult: Ulceration left lower extremity Referring Physician: Dr. Maurene Capes Adam [name] is an 46 y.o. male.  HPI: Patient is a 47 year old gentleman end-stage renal disease on dialysis with multiple medical problems who is seen for consultation for ulceration left lower extremity. Patient has type 2 diabetes. Patient presents complaining of a chronic swelling of both lower extremities with ulceration on the left lower extremity.  Past Medical History  Diagnosis Date  . Other and unspecified hyperlipidemia   . Insomnia, unspecified   . Obstructive sleep apnea (adult) (pediatric)   . Allergic rhinitis, cause unspecified   . Nonischemic cardiomyopathy   . Unspecified essential hypertension   . Type II or unspecified type diabetes mellitus without mention of complication, not stated as uncontrolled   . CKD (chronic kidney disease) stage 3, GFR 30-59 ml/min   . Esophageal reflux   . Morbid obesity   . Dysuria   . Tobacco use disorder   . Dysrhythmia   . Pacemaker   . Antral ulcer   . Renal azotemia   . Arthritis     Gout w/hyperuricemia  . Morbid obesity     Past Surgical History  Procedure Laterality Date  . Cardiac defibrillator placement  2011  . Esophagogastroduodenoscopy  01/03/2011    Procedure: ESOPHAGOGASTRODUODENOSCOPY (EGD);  Surgeon: Vertell Novak., MD;  Location: [facility] ENDOSCOPY;  Service: Endoscopy;  Laterality: N/A;  . Av fistula placement Right 08/31/2012    Procedure: ARTERIOVENOUS (AV) FISTULA CREATION;  Surgeon: Chuck Hint, MD;  Location: [facility] OR;  Service: Vascular;  Laterality: Right;  . Insertion of dialysis catheter Right 08/31/2012    Procedure: INSERTION OF DIALYSIS CATHETER-Right Internal Jugular Placement;  Surgeon: Chuck Hint, MD;  Location: [facility] OR;  Service: Vascular;  Laterality: Right;    Family History  Problem Relation Age of Onset  . Diabetes Mother   . Microcephaly Father   . Lung cancer Father     Social History:   reports that he has been smoking Cigarettes.  He has a .25 pack-year smoking history. He has never used smokeless tobacco. He reports that he does not drink alcohol or use illicit drugs.  Allergies:  Allergies  Allergen Reactions  . Ace Inhibitors     Acute renal failure with multiple trials    Medications: I have reviewed the patient's current medications.  Results for orders placed during the hospital encounter of 09/09/12 (from the past 48 hour(s))  GLUCOSE, CAPILLARY     Status: Abnormal   Collection Time    09/14/12  7:21 AM      Result Value Range   Glucose-Capillary 152 (*) 70 - 99 mg/dL  GLUCOSE, CAPILLARY     Status: Abnormal   Collection Time    09/14/12 11:36 AM      Result Value Range   Glucose-Capillary 139 (*) 70 - 99 mg/dL  HEPARIN LEVEL (UNFRACTIONATED)     Status: Abnormal   Collection Time    09/14/12  1:35 PM      Result Value Range   Heparin Unfractionated >2.20 (*) 0.30 - 0.70 IU/mL   Comment: RESULTS CONFIRMED BY MANUAL DILUTION                IF HEPARIN RESULTS ARE BELOW     EXPECTED VALUES, AND PATIENT     DOSAGE HAS BEEN CONFIRMED,     SUGGEST FOLLOW UP TESTING     OF ANTITHROMBIN III LEVELS.  BASIC METABOLIC PANEL  Status: Abnormal   Collection Time    09/14/12  2:57 PM      Result Value Range   Sodium 135  135 - 145 mEq/L   Potassium 3.7  3.5 - 5.1 mEq/L   Chloride 99  96 - 112 mEq/L   CO2 20  19 - 32 mEq/L   Glucose, Bld 148 (*) 70 - 99 mg/dL   BUN 59 (*) 6 - 23 mg/dL   Creatinine, Ser 1.61 (*) 0.50 - 1.35 mg/dL   Calcium 9.0  8.4 - 09.6 mg/dL   GFR calc non Af Amer 18 (*) >90 mL/min   GFR calc Af Amer 21 (*) >90 mL/min   Comment: (NOTE)     The eGFR has been calculated using the CKD EPI equation.     This calculation has not been validated in all clinical situations.     eGFR's persistently <90 mL/min signify possible Chronic Kidney     Disease.  MAGNESIUM     Status: None   Collection Time    09/14/12  2:57 PM      Result Value  Range   Magnesium 2.0  1.5 - 2.5 mg/dL  GLUCOSE, CAPILLARY     Status: Abnormal   Collection Time    09/14/12  4:12 PM      Result Value Range   Glucose-Capillary 124 (*) 70 - 99 mg/dL  GLUCOSE, CAPILLARY     Status: Abnormal   Collection Time    09/14/12  8:27 PM      Result Value Range   Glucose-Capillary 168 (*) 70 - 99 mg/dL  TROPONIN I     Status: None   Collection Time    09/14/12 10:30 PM      Result Value Range   Troponin I <0.30  <0.30 ng/mL   Comment:            Due to the release kinetics of cTnI,     a negative result within the first hours     of the onset of symptoms does not rule out     myocardial infarction with certainty.     If myocardial infarction is still suspected,     repeat the test at appropriate intervals.  HEPARIN LEVEL (UNFRACTIONATED)     Status: Abnormal   Collection Time    09/14/12 11:50 PM      Result Value Range   Heparin Unfractionated <0.10 (*) 0.30 - 0.70 IU/mL   Comment:            IF HEPARIN RESULTS ARE BELOW     EXPECTED VALUES, AND PATIENT     DOSAGE HAS BEEN CONFIRMED,     SUGGEST FOLLOW UP TESTING     OF ANTITHROMBIN III LEVELS.     REPEATED TO VERIFY  CBC     Status: Abnormal   Collection Time    09/15/12  5:00 AM      Result Value Range   WBC 9.3  4.0 - 10.5 K/uL   RBC 3.02 (*) 4.22 - 5.81 MIL/uL   Hemoglobin 8.2 (*) 13.0 - 17.0 g/dL   HCT 04.5 (*) 40.9 - 81.1 %   MCV 86.4  78.0 - 100.0 fL   MCH 27.2  26.0 - 34.0 pg   MCHC 31.4  30.0 - 36.0 g/dL   RDW 91.4 (*) 78.2 - 95.6 %   Platelets 131 (*) 150 - 400 K/uL  MAGNESIUM     Status: None   Collection Time  09/15/12  5:00 AM      Result Value Range   Magnesium 1.9  1.5 - 2.5 mg/dL  RENAL FUNCTION PANEL     Status: Abnormal   Collection Time    09/15/12  5:00 AM      Result Value Range   Sodium 136  135 - 145 mEq/L   Potassium 3.7  3.5 - 5.1 mEq/L   Chloride 99  96 - 112 mEq/L   CO2 23  19 - 32 mEq/L   Glucose, Bld 150 (*) 70 - 99 mg/dL   BUN 65 (*) 6 - 23  mg/dL   Creatinine, Ser 1.61 (*) 0.50 - 1.35 mg/dL   Calcium 9.1  8.4 - 09.6 mg/dL   Phosphorus 3.5  2.3 - 4.6 mg/dL   Albumin 3.1 (*) 3.5 - 5.2 g/dL   GFR calc non Af Amer 16 (*) >90 mL/min   GFR calc Af Amer 19 (*) >90 mL/min   Comment: (NOTE)     The eGFR has been calculated using the CKD EPI equation.     This calculation has not been validated in all clinical situations.     eGFR's persistently <90 mL/min signify possible Chronic Kidney     Disease.  GLUCOSE, CAPILLARY     Status: Abnormal   Collection Time    09/15/12  8:05 AM      Result Value Range   Glucose-Capillary 173 (*) 70 - 99 mg/dL   Comment 1 Documented in Chart     Comment 2 Notify RN    GLUCOSE, CAPILLARY     Status: Abnormal   Collection Time    09/15/12 11:42 AM      Result Value Range   Glucose-Capillary 164 (*) 70 - 99 mg/dL   Comment 1 Documented in Chart     Comment 2 Notify RN    GLUCOSE, CAPILLARY     Status: Abnormal   Collection Time    09/15/12  5:17 PM      Result Value Range   Glucose-Capillary 187 (*) 70 - 99 mg/dL   Comment 1 Documented in Chart     Comment 2 Notify RN    HEPARIN LEVEL (UNFRACTIONATED)     Status: Abnormal   Collection Time    09/15/12  8:57 PM      Result Value Range   Heparin Unfractionated 0.12 (*) 0.30 - 0.70 IU/mL   Comment:            IF HEPARIN RESULTS ARE BELOW     EXPECTED VALUES, AND PATIENT     DOSAGE HAS BEEN CONFIRMED,     SUGGEST FOLLOW UP TESTING     OF ANTITHROMBIN III LEVELS.  GLUCOSE, CAPILLARY     Status: Abnormal   Collection Time    09/15/12  9:03 PM      Result Value Range   Glucose-Capillary 219 (*) 70 - 99 mg/dL    No results found.  Review of Systems  All other systems reviewed and are negative.   Blood pressure 102/64, pulse 112, temperature 99.6 F (37.6 C), temperature source Oral, resp. rate 20, height 6\' 1"  (1.854 m), weight 155.9 kg (343 lb 11.2 oz), SpO2 98.00%. Physical Exam On examination patient does not have palpable  dorsalis pedis pulses do to the swelling. Review of his ankle brachial indices shows adequate triphasic flow with elevated pressures in the foot consistent with calcified vessels. Patient has brawny skin color changes in both lower extremities with distended  veins consistent with venous insufficiency. There were multiple venous stasis insufficiency ulcers and the left lower extremity is grossly venous stasis insufficiency ulcer the dorsum of the left foot. There is a small abrasion on the great toe no signs of infection. Assessment/Plan: Assessment: Diabetic insensate neuropathy with end-stage renal disease on dialysis with venous stasis insufficiency with venous stasis ulceration left lower extremity. Plan will have the wound care nurses apply a Profore wrap 4 both lower extremities. I will followup in the office. I will have the vascular lab venous duplex to confirm venous incompetence. Patient may be discharged to home with the compression wraps and I will followup in the office for wound care.  DUDA,MARCUS V 09/16/2012, 6:23 AM

## 2012-09-16 NOTE — Progress Notes (Signed)
Inpatient Diabetes Program Recommendations  AACE/ADA: New Consensus Statement on Inpatient Glycemic Control (2013)  Target Ranges:  Prepandial:   less than 140 mg/dL      Peak postprandial:   less than 180 mg/dL (1-2 hours)      Critically ill patients:  140 - 180 mg/dL   Results for Adam [name], Adam [name] (MRN [id number]) as of 09/16/2012 10:11  Ref. Range 09/15/2012 08:05 09/15/2012 11:42 09/15/2012 17:17 09/15/2012 21:03 09/16/2012 08:09  Glucose-Capillary Latest Range: 70-99 mg/dL 409 (H) 811 (H) 914 (H) 219 (H) 166 (H)    Inpatient Diabetes Program Recommendations Insulin - Basal: Please increase Lantus to 17 units QHS.  Note: Blood glucose ranged from 164-219 mg/dl on 7/82 and fasting glucose this morning is 166 mg/dl.  Please consider increasing Lantus from 15 units QHS to 17 units QHS to improve inpatient glycemic control.  Will continue to follow.  Thanks, Orlando Penner, RN, MSN, CCRN Diabetes Coordinator Inpatient Diabetes Program [phone]

## 2012-09-16 NOTE — Progress Notes (Signed)
[facility] Progress NOTE    Subjective: Denies sob. Breathing a little better today. Denies chest pain.  Wants to know if getting HD today  Current medications: Current Facility-Administered Medications  Medication Dose Route Frequency Provider Last Rate Last Dose  . 0.9 %  sodium chloride infusion  100 mL Intravenous PRN Lauris Poag, MD      . 0.9 %  sodium chloride infusion  100 mL Intravenous PRN Lauris Poag, MD      . 0.9 %  sodium chloride infusion  250 mL Intravenous PRN Na Dierdre Searles, MD      . acetaminophen (TYLENOL) tablet 650 mg  650 mg Oral Q6H PRN Jonah Blue, DO      . cephALEXin (KEFLEX) capsule 250 mg  250 mg Oral Q2200 Marjan Rabbani, MD   250 mg at 09/15/12 2202  . diclofenac sodium (VOLTAREN) 1 % transdermal gel 2 g  2 g Topical QID PRN Jonah Blue, DO      . docusate sodium (COLACE) capsule 100 mg  100 mg Oral BID PRN Jonah Blue, DO      . doxercalciferol (HECTOROL) injection 2 mcg  2 mcg Intravenous Q T,Th,Sa-HD Irena Cords, MD      . febuxostat (ULORIC) tablet 80 mg  80 mg Oral Daily Alejandro Paya, DO   80 mg at 09/15/12 1017  . ferric gluconate (NULECIT) 125 mg in sodium chloride 0.9 % 100 mL IVPB  125 mg Intravenous Q T,Th,Sa-HD Irena Cords, MD      . heparin ADULT infusion 100 units/mL (25000 units/250 mL)  2,350 Units/hr Intravenous Continuous Fayne Norrie, RPH 23.5 mL/hr at 09/16/12 1610 2,350 Units/hr at 09/16/12 0635  . insulin aspart (novoLOG) injection 0-9 Units  0-9 Units Subcutaneous TID WC Linward Headland, MD   2 Units at 09/15/12 1846  . insulin glargine (LANTUS) injection 15 Units  15 Units Subcutaneous QHS Dede Query, MD   15 Units at 09/15/12 2204  . lidocaine (PF) (XYLOCAINE) 1 % injection 5 mL  5 mL Intradermal PRN Lauris Poag, MD      . lidocaine-prilocaine (EMLA) cream 1 application  1 application Topical PRN Lauris Poag, MD      . LORazepam (ATIVAN) tablet 0.5 mg  0.5 mg Oral BID PRN Jonah Blue, DO    0.5 mg at 09/11/12 1334  . metoprolol tartrate (LOPRESSOR) tablet 12.5 mg  12.5 mg Oral QID Pricilla Riffle, MD   12.5 mg at 09/15/12 2232  . ondansetron (ZOFRAN) injection 4 mg  4 mg Intravenous PRN Marjan Rabbani, MD      . pantoprazole (PROTONIX) EC tablet 40 mg  40 mg Oral Daily Linward Headland, MD   40 mg at 09/15/12 1017  . pentafluoroprop-tetrafluoroeth (GEBAUERS) aerosol 1 application  1 application Topical PRN Lauris Poag, MD      . simvastatin (ZOCOR) tablet 40 mg  40 mg Oral QPM Linward Headland, MD   40 mg at 09/15/12 1847  . sodium chloride 0.9 % injection 3 mL  3 mL Intravenous Q12H Na Li, MD   3 mL at 09/14/12 2122  . sodium chloride 0.9 % injection 3 mL  3 mL Intravenous PRN Na Li, MD   3 mL at 09/10/12 1137  . traMADol (ULTRAM) tablet 50 mg  50 mg Oral Q12H PRN Dede Query, MD   50 mg at 09/12/12 2233  . warfarin (COUMADIN) tablet 7.5 mg  7.5 mg Oral  J1914 Marinus Maw, MD   7.5 mg at 09/15/12 1846  . Warfarin - Physician Dosing Inpatient   Does not apply q1800 Herby Abraham, [facility]         Vital Signs: Blood pressure 102/64, pulse 112, temperature 99.6 F (37.6 C), temperature source Oral, resp. rate 20, height 6\' 1"  (1.854 m), weight 343 lb 11.2 oz (155.9 kg), SpO2 98.00%.  Weight trends: Filed Weights   09/15/12 1328 09/15/12 1631 09/15/12 2100  Weight: 350 lb 15.6 oz (159.2 kg) 344 lb 2.2 oz (156.1 kg) 343 lb 11.2 oz (155.9 kg)    Physical Exam: General: Vital signs reviewed and noted. Well-developed, obese, in no acute distress;  appropriate and cooperative throughout examination.  Head: Normocephalic, atraumatic.  Lungs:  Course breath sounds and wheezing b/l  Heart: tachycardic, no murmurs   Abdomen:  BS normoactive. Soft, obese, non-tender.    Extremities: B/l 1+ edema, no cyanosis, hyperpigmentation and thickening to lower extremities. Left great toe with wound  Neurologic: Alert and oriented x 3   Skin: hyperpigmentation and thickening to lower extremities    Lab  results: Basic Metabolic Panel:  Recent Labs Lab 09/11/12 0631 09/13/12 1200 09/14/12 0430 09/14/12 1457 09/15/12 0500  NA 137 134* 136 135 136  K 3.6 3.7 3.4* 3.7 3.7  CL 103 99 99 99 99  CO2 20 21 22 20 23   GLUCOSE 99 147* 123* 148* 150*  BUN 83* 73* 57* 59* 65*  CREATININE 3.58* 3.97* 3.51* 3.61* 4.04*  CALCIUM 9.0 9.0 9.0 9.0 9.1  MG  --  2.1 2.0 2.0 1.9  PHOS 4.8* 4.1  --   --  3.5    Liver Function Tests:  Recent Labs Lab 09/09/12 1915  09/11/12 0631 09/13/12 1200 09/15/12 0500  AST 14  --   --   --   --   ALT 6  --   --   --   --   ALKPHOS 145*  --   --   --   --   BILITOT 1.7*  --   --   --   --   PROT 7.6  --   --   --   --   ALBUMIN 3.3*  < > 3.3* 3.1* 3.1*  < > = values in this interval not displayed.  CBC:  Recent Labs Lab 09/11/12 0631 09/13/12 1200 09/14/12 0430 09/15/12 0500 09/16/12 0525  WBC 6.9 6.4 7.3 9.3 9.9  HGB 8.3* 8.2* 8.2* 8.2* 8.0*  HCT 26.2* 26.8* 26.0* 26.1* 25.8*  MCV 85.6 86.5 85.8 86.4 86.3  PLT 189 180 136* 131* 94*   CBG:  Recent Labs Lab 09/14/12 2027 09/15/12 0805 09/15/12 1142 09/15/12 1717 09/15/12 2103  GLUCAP 168* 173* 164* 187* 219*     Imaging: No results found.   Assessment & Plan: Pt is a 47 y.o. 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 He presented to the Internal Medicine Clinic with weight gain of 50 lbs during the last month (epic review shows weight 316-->350 lbs), increased shortness of breath, lower extremity edema and abdominal bloating.  He recently had a right AVF placed on 08/31/12.  1. Fluid overload secondary to end stage renal disease  -Renal following -Patient had HD Thursday, Saturday,  Monday, Weds 8/20 and will have HD possibly 8/21  Thursday -PTH 397, vitamin D 10, Fe studies indicate Fe def. vitamin D level (10) low -will get HD outpatient at [facility] -renal to dose Aranesp, Hectoral, Fe -trend daily weights and i/o's   2. 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  CXR 8/15 indicates patient has cardiomegaly, CHF and vascular congestion.  Will likely benefit from HD.  -Monitor strict i/o, daily weights  -BB (Metoprolol 12.5 mg qid), Continue statin, allergic to ACEI. -cardiology consulted per cards hold Dig with HD  3. Possible Atrial fibrillation  -on heparin gtt per cards, Coumadin.  Lopressor 12.5 mg qid   4. Diabetes 2 uncontrolled (HA1C 9.1 in 07/2012)  -hyperglycemia intermittently. primary team addressing  -Lantus 15 units qhs, SSI  5. Hypotension, improved  -Etiology could be related to anemia, medications, heart failure history.   -monitor BP still soft intermittently and dose of BB increased in freq   6. Left great toe infection -Continue Abx x 7 days total per ortho   7. F/E/N -renal diet   8. DVT PPX  - heparin gtt , Coumadin  Desma Maxim MD PGY 2 IM Discussed with attending

## 2012-09-16 NOTE — Consult Note (Signed)
WOC consult Note Reason for Consult: Consult has been requested by Dr Lajoyce Corners to apply bilat Profore compression wraps after venous doppler study is performed today.  Pt is preparing to go to dialysis at this time and will be there until afternoon; doppler study will not be able to be performed until late afternoon or evening. Plan to apply Profore wraps early AM on 8/22. Cammie Mcgee MSN, RN, CWOCN, Secretary, CNS [phone]

## 2012-09-16 NOTE — Progress Notes (Signed)
  Date: 09/16/2012  Patient name: Adam [name]  Medical record number: [id number]  Date of birth: 1965-04-02   This patient has been seen and the plan of care was discussed with the house staff. Please see their note for complete details. I concur with their findings with the following additions/corrections:  Clinically improving. Cards is following. Acute on chronic systolic/diastolic failure. His Afib rate is still an issue, metoprolol being adjusted.  He is getting additional fluid removed in HD as tolerated by his BP.   As far as anticoagulation, cardiology feels he needs a bridge with heparin gtt to coumadin at this time, will leave this decision to them. He is hemodynamically stable.  Jonah Blue, DO, FACP Faculty [facility] 09/16/2012, 3:00 PM

## 2012-09-16 NOTE — Progress Notes (Addendum)
ANTICOAGULATION CONSULT NOTE - Follow Up Consult  Pharmacy Consult for Heparin  Indication: atrial fibrillation  Allergies  Allergen Reactions  . Ace Inhibitors     Acute renal failure with multiple trials    Patient Measurements: Height: 6\' 1"  (185.4 cm) Weight: 343 lb 11.2 oz (155.9 kg) IBW/kg (Calculated) : 79.9 Heparin Dosing Weight: 117 kg  Vital Signs: Temp: 99.6 F (37.6 C) (08/21 0554) Temp src: Oral (08/21 0554) BP: 102/64 mmHg (08/21 0554) Pulse Rate: 112 (08/21 0554)  Labs:  Recent Labs  09/13/12 1824 09/13/12 2030 09/14/12 0430  09/14/12 1457 09/14/12 2230 09/14/12 2350 09/15/12 0500 09/15/12 2057 09/16/12 0525 09/16/12 0530  HGB  --   --  8.2*  --   --   --   --  8.2*  --  8.0*  --   HCT  --   --  26.0*  --   --   --   --  26.1*  --  25.8*  --   PLT  --   --  136*  --   --   --   --  131*  --  94*  --   HEPARINUNFRC  --   --  <0.10*  < >  --   --  <0.10*  --  0.12*  --  <0.10*  CREATININE  --   --  3.51*  --  3.61*  --   --  4.04*  --   --   --   TROPONINI <0.30 <0.30  --   --   --  <0.30  --   --   --   --   --   < > = values in this interval not displayed.  Estimated Creatinine Clearance: 34.9 ml/min (by C-G formula based on Cr of 4.04).   Medications:  Heparin at 2350 units/hr  Assessment: 47 y/o M on heparin for A-fib. Pt has limited access and must have short stoppage of heparin drip for draws. Pt has had two undetectable levels and one level that was >2.2 that was drawn while heparin was still infusing. Was detectable yesterday evening at HL of 0.12 so rate was increased. This AM, the heparin was turned off for ~45 mins and level was undetectable. Heparin was turned off again (for ~1.5 hours) for more lab draws this AM). Heparin re-started at 2350 units/hr and will f/u HD plans to figure out when to drawn next level. This heparin will be challenging to manage given inability to get labs drawn correctly; will do the best we can.    Goal of  Therapy:  Heparin level 0.3-0.7 units/ml Monitor platelets by anticoagulation protocol: Yes   Plan:  -Continue heparin drip at 2350 units/hr -Will f/u HD plans for today before ordering f/u heparin level -Daily CBC/HL -Monitor for bleeding -Warfarin bridge per MD (will still monitor INR)---INR is 1.37 this AM  Thank you for allowing me to take part in this patient's care,  Abran Duke, PharmD Clinical Pharmacist Phone: [phone] Pager: [phone] 09/16/2012 8:53 AM  Addendum: 12:39 PM Will schedule the HL for this evening at 2000. Will notify evening PharmD and well as RN to prevent the shortest delay in heparin that we can.   Wilmer Floor, PharmD Pager [phone] @TD 

## 2012-09-17 LAB — GLUCOSE, CAPILLARY: Glucose-Capillary: 163 mg/dL — ABNORMAL HIGH (ref 70–99)

## 2012-09-17 LAB — CBC
MCH: 26.9 pg (ref 26.0–34.0)
MCHC: 30.4 g/dL (ref 30.0–36.0)
Platelets: 73 10*3/uL — ABNORMAL LOW (ref 150–400)
RDW: 17.8 % — ABNORMAL HIGH (ref 11.5–15.5)

## 2012-09-17 LAB — RENAL FUNCTION PANEL
Albumin: 3.1 g/dL — ABNORMAL LOW (ref 3.5–5.2)
BUN: 34 mg/dL — ABNORMAL HIGH (ref 6–23)
Creatinine, Ser: 2.96 mg/dL — ABNORMAL HIGH (ref 0.50–1.35)
Phosphorus: 2.6 mg/dL (ref 2.3–4.6)

## 2012-09-17 LAB — PROTIME-INR: Prothrombin Time: 16 seconds — ABNORMAL HIGH (ref 11.6–15.2)

## 2012-09-17 LAB — APTT
aPTT: 42 seconds — ABNORMAL HIGH (ref 24–37)
aPTT: 109 seconds — ABNORMAL HIGH (ref 24–37)

## 2012-09-17 MED ORDER — METOPROLOL TARTRATE 50 MG PO TABS
50.0000 mg | ORAL_TABLET | Freq: Three times a day (TID) | ORAL | Status: DC
  Administered 2012-09-17 – 2012-09-20 (×9): 50 mg via ORAL
  Filled 2012-09-17 (×15): qty 1

## 2012-09-17 MED ORDER — WARFARIN - PHARMACIST DOSING INPATIENT: Freq: Every day | Status: DC

## 2012-09-17 MED ORDER — INSULIN GLARGINE 100 UNIT/ML ~~LOC~~ SOLN
17.0000 [IU] | Freq: Every day | SUBCUTANEOUS | Status: DC
  Administered 2012-09-17 – 2012-09-19 (×3): 17 [IU] via SUBCUTANEOUS
  Filled 2012-09-17 (×4): qty 0.17

## 2012-09-17 MED ORDER — ARGATROBAN 50 MG/50ML IV SOLN
1.0000 ug/kg/min | INTRAVENOUS | Status: DC
  Administered 2012-09-17 (×2): 1 ug/kg/min via INTRAVENOUS
  Filled 2012-09-17 (×2): qty 50

## 2012-09-17 MED ORDER — ARGATROBAN 50 MG/50ML IV SOLN
0.7000 ug/kg/min | INTRAVENOUS | Status: DC
  Administered 2012-09-17 – 2012-09-18 (×2): 0.7 ug/kg/min via INTRAVENOUS
  Filled 2012-09-17: qty 50

## 2012-09-17 MED ORDER — WARFARIN SODIUM 10 MG PO TABS
10.0000 mg | ORAL_TABLET | Freq: Once | ORAL | Status: DC
  Filled 2012-09-17: qty 1

## 2012-09-17 NOTE — Progress Notes (Addendum)
Subjective:  Pt seen and examined in AM. Reports feeling better. Becomes hot, SOB when lies down flat as well as with cough.  No CP, palpitations, abdominal pain, nausea, or vomiting. Normal BM and urination. Tolerated HD yesterday.   Objective: Vital signs in last 24 hours: Filed Vitals:   09/16/12 1658 09/16/12 1706 09/16/12 2111 09/17/12 0519  BP: 119/69 108/64 111/75 103/57  Pulse: 108 104 62 102  Temp:  98.1 F (36.7 C) 99.9 F (37.7 C) 99.5 F (37.5 C)  TempSrc:  Oral Oral Oral  Resp: 15 23 22 20   Height:      Weight:  155.5 kg (342 lb 13 oz)    SpO2:  100% 97% 99%   Weight change: -1.2 kg (-2 lb 10.3 oz)  Intake/Output Summary (Last 24 hours) at 09/17/12 4696 Last data filed at 09/16/12 1706  Gross per 24 hour  Intake    480 ml  Output   2954 ml  Net  -2474 ml   Constitutional: Obese, NAD Head: Normocephalic Eyes: PERRL Neck: Supple Cardiovascular: tachycardia, RRR, S1 normal, S2 normal, pitting +2 edema b/l from calves to abdomen.  Pulmonary/Chest: Air entry equal bilaterally. Scatterd ronch wheezing Abdominal: Soft. Non-tender, non-distended, bowel sounds are normal, no masses, organomegaly, or guarding present.  Musculoskeletal: No synovitis, effusions, or erythema of bilateral upper and lower  appendicular joints  Neurological: A&O x3 Skin: Chronic venous stasis changes of bilateral lower legs, w/ oozing lesion on right leg, left foot wrapped in gauze    Lab Results: Basic Metabolic Panel:  Recent Labs Lab 09/14/12 1457 09/15/12 0500 09/16/12 0905  NA 135 136 134*  K 3.7 3.7 3.7  CL 99 99 98  CO2 20 23 23   GLUCOSE 148* 150* 200*  BUN 59* 65* 48*  CREATININE 3.61* 4.04* 3.48*  CALCIUM 9.0 9.1 8.8  MG 2.0 1.9  --   PHOS  --  3.5 2.9   Liver Function Tests:  Recent Labs Lab 09/15/12 0500 09/16/12 0905  ALBUMIN 3.1* 3.0*   No results found for this basename: LIPASE, AMYLASE,  in the last 168 hours No results found for this basename:  AMMONIA,  in the last 168 hours CBC:  Recent Labs Lab 09/15/12 0500 09/16/12 0525  WBC 9.3 9.9  HGB 8.2* 8.0*  HCT 26.1* 25.8*  MCV 86.4 86.3  PLT 131* 94*   Cardiac Enzymes:  Recent Labs Lab 09/13/12 1824 09/13/12 2030 09/14/12 2230  TROPONINI <0.30 <0.30 <0.30   BNP: No results found for this basename: PROBNP,  in the last 168 hours D-Dimer: No results found for this basename: DDIMER,  in the last 168 hours CBG:  Recent Labs Lab 09/15/12 1717 09/15/12 2103 09/16/12 0809 09/16/12 1225 09/16/12 1808 09/16/12 2115  GLUCAP 187* 219* 166* 175* 130* 175*   Hemoglobin A1C: No results found for this basename: HGBA1C,  in the last 168 hours Fasting Lipid Panel: No results found for this basename: CHOL, HDL, LDLCALC, TRIG, CHOLHDL, LDLDIRECT,  in the last 168 hours Thyroid Function Tests: No results found for this basename: TSH, T4TOTAL, FREET4, T3FREE, THYROIDAB,  in the last 168 hours Coagulation:  Recent Labs Lab 09/16/12 0905  LABPROT 16.5*  INR 1.37   Anemia Panel:  Recent Labs Lab 09/13/12 1300  TIBC 406  IRON 19*   Urine Drug Screen: Drugs of Abuse     Component Value Date/Time   LABOPIA NEG 03/17/2006 1455   COCAINSCRNUR NEG 03/17/2006 1455   LABBENZ NEG 03/17/2006  1455   AMPHETMU NEG 03/17/2006 1455    Alcohol Level: No results found for this basename: ETH,  in the last 168 hours Urinalysis: No results found for this basename: COLORURINE, APPERANCEUR, LABSPEC, PHURINE, GLUCOSEU, HGBUR, BILIRUBINUR, KETONESUR, PROTEINUR, UROBILINOGEN, NITRITE, LEUKOCYTESUR,  in the last 168 hours Misc. Labs:   Micro Results: No results found for this or any previous visit (from the past 240 hour(s)). Studies/Results: No results found. Medications: I have reviewed the patient's current medications. Scheduled Meds: . cephALEXin  250 mg Oral Q2200  . doxercalciferol  2 mcg Intravenous Q T,Th,Sa-HD  . febuxostat  80 mg Oral Daily  . ferric gluconate  (FERRLECIT/NULECIT) IV  125 mg Intravenous Q T,Th,Sa-HD  . insulin aspart  0-9 Units Subcutaneous TID WC  . insulin glargine  15 Units Subcutaneous QHS  . metoprolol tartrate  25 mg Oral QID  . pantoprazole  40 mg Oral Daily  . simvastatin  40 mg Oral QPM  . sodium chloride  3 mL Intravenous Q12H  . warfarin  7.5 mg Oral q1800  . Warfarin - Physician Dosing Inpatient   Does not apply q1800   Continuous Infusions: . heparin 2,350 Units/hr (09/16/12 1843)   PRN Meds:.sodium chloride, sodium chloride, sodium chloride, acetaminophen, diclofenac sodium, docusate sodium, lidocaine (PF), lidocaine-prilocaine, LORazepam, ondansetron (ZOFRAN) IV, pentafluoroprop-tetrafluoroeth, sodium chloride, traMADol Assessment/Plan: Principal Problem:   CKD (chronic kidney disease) stage 4, GFR 15-29 ml/min Active Problems:   Type II or unspecified type diabetes mellitus with renal manifestations, uncontrolled(250.42)   DYSLIPIDEMIA   Hypopotassemia   Morbid obesity   ANXIETY   TOBACCO ABUSE   OBSTRUCTIVE SLEEP APNEA   HYPERTENSION   CARDIOMYOPATHY   Chronic systolic heart failure   GERD   Gastric ulcer   Leg swelling   Knee pain   ICD (implantable cardioverter-defibrillator), single, in situ   Normocytic anemia   Open wound of foot   End stage renal disease   #CKD Stage 4 to ESRD with severe volume overload refractory to diuretic therapy necessitating emergent HD, with right arm AV cimino fistula and perm cath last week  -HD 8/14 -  tolerated well, 4L off, catheter running high pressure, episode of hypotension and tachycardia - asymptomatic -HD 8/16 - tolerated well, 3L off  -HD 8/18 - tolerated well, 4L off -HD 8/20 - tacyhcardia,  2.9L off -HD 8/21-  Erratic HR, bp low 100s,  2.9L off  -On TTS schedule, HD again Saturday -Cr downtrending  - Carb restricted diet with 1.0L fluid restriction  -Continue daily weight - 20 lb since admission -Monitor for hyperkalemia  -PT - home health PT  recommended    #Thrombocytopenia - 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 IV Heparin started on 8/18 with thrombocytopenia on 8/19  -Platlet Count: 230 (8/14) to 189 (8/16)  to 180 (8/18) to 136 (8/19)  to 131 (8/20)  to 94 (8/21)  to 73 (8/22)  (50% drop in last 7 days) -STOP heparin -Obtain HIT panel -s tart direct thrombin inhibitor argatroban and vitamin K due to risk of microthrombosis if continue coumadin (INR 1.3) or stop coumadin.  Preferably coumadin not restarted until Platlet >150K but pt already on coumadin.   -No evidence of DVT - already scheduled for Doppler of left LE per ortho    # Atrial Fibrallation -  asymptomatic, hemodynamically stable on 8/18  HR 130s, SBP in 90s, 3 episodes of Vtach 8/18  1  episode on 8/19 and 8/20  -Tele with atrial fib/flutter with RVR   -No firing of AICD - no interrogation needed per cards -BMP and Mg levels - WNL -Orthostatic labs - negative  -Bolus as needed if SBP<90 -Cardiology consult -  PO  metprolol 50 mg TID, no digoxin or amiodarone for now  -Per cardiology recs - on Heparin and bridge to coumadin until therapeutic INR (>2) due to CHAD2 score of 3 (CHF, HTN, DM) high risk of thromboembolic event (5.9% risk of stroke per year if no coumadin), monitor for bleeding --due to thrombocytopenia and 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 will STOP heparin and coumadin     # Acute on chronic CHF with EF of 25% - Nonischemic cardiomyopathy with AICD (2011)  -Obtain cardiology consult - PO  metprolol 50mg  TID  Was on heparin and bridge to coumadin until therapeutic INR >2 (INR 1.3 today) -STOP heparin due to 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 - also stop coumadin - CXR (8/15) --> cardiomegaly and pulmonary edema suggesting CHF, no pleural effusion   - Monitor on telemetry -Continue HD to remove excess fluid  Left forefoot wound with paronychia infection of left great toe  - most likely diabetic pressure ulcer  with cellultis -Per wound consultation - Left forefoot: 1.5cm x 2cm x 0.2cm moist, pale pink ulcer surrounded by three pinpoint ulcerations (<0.2cm round); total area of involvement measures 4cm x 3cm. Left great toe with what appears to be an infected paronychia, recommend I & D of paronychia and antibiotics -Obtain Xray left foot  - no osteomyelitis  -Obtain ABI - R 1.41 L 1.39 - suggesting calcified vessels, waveforms of b/l LE WNL -Ortho consult - Per Dr. Eulah Pont no surgical debridement at this time, continue wound care, antibiotics -oral keflex 250mg  BID for 5 days, day 3 today   - Per Dr. Lajoyce Corners - wound care nurses to apply a Profore wrap 4 both lower extremities. To be d/c with compression wraps and f/u in office for wound care . 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 Nausea w/o vomiting or  abdominal pain, normal BM, likely due to HD and electrolyte/fluid shift;  started 8/15, 1 episode of green emesis on 8/16 and again 8/17 after eating   -Zofran as needed for nausea    # Somnolence - resolved. Pt reports increased sleepiness after starting HD  --ABG on 8/15- hypoxia, hypocapnia, low bicarbonate - D/c narcotics    #DM c/b dyslipidemia - last HbA1c 9.1 on 07/27/2012  -CBG WNL today - Lantus 15 U q pm increase to 17U due to elevated CBG - SSI for meals  - Continue home statin  - Lipid panel 03/2012 - low HDL (26)  #OSA  - CPAP  at  night    # Anxiety  - Ativan 0.5 mg prn   #Knee Pain  - Tylenold prn for mild pain  - Tramadol,  Oxycodone AS NEEDED due to somnolence and hypotension    -  Voltaren Gel   # Gout  - Febuxostat   #GERD  - Continue home PPI   # Prophylaxis  - IV heparin   ADDENDUM: Per card metoprolol 50mg  TID   Dispo: Disposition is deferred at this time, awaiting improvement of current medical problems.  Anticipated discharge in approximately  day(s).   The patient does have a current PCP Lars Masson, MD) and does need an [facility] hospital follow-up appointment after discharge.  The patient does have transportation limitations that hinder transportation to clinic appointments.  .Services Needed at time of discharge: Y = Yes, Blank = No PT:   OT:   RN:   Equipment:   Other:     LOS: 8 days   Otis Brace, MD 09/17/2012, 7:08 AM

## 2012-09-17 NOTE — Progress Notes (Addendum)
ANTICOAGULATION CONSULT NOTE - Follow-Up Consult  Pharmacy Consult for Argatroban Indication: R/O HIT; afib  Allergies  Allergen Reactions  . Ace Inhibitors     Acute renal failure with multiple trials  . Heparin     HIT panel pending 8/22    Patient Measurements: Height: 6\' 1"  (185.4 cm) Weight: 342 lb 13 oz (155.5 kg) IBW/kg (Calculated) : 79.9 Heparin Dosing Weight: n/a  Vital Signs: Temp: 98.5 F (36.9 C) (08/22 1744) Temp src: Oral (08/22 1744) BP: 110/75 mmHg (08/22 1744) Pulse Rate: 79 (08/22 1744)  Labs:  Recent Labs  09/14/12 2230  09/15/12 0500 09/15/12 2057 09/16/12 0525 09/16/12 0530 09/16/12 0905 09/17/12 0640 09/17/12 1800 09/17/12 2055  HGB  --   < > 8.2*  --  8.0*  --   --  7.7*  --   --   HCT  --   --  26.1*  --  25.8*  --   --  25.3*  --   --   PLT  --   --  131*  --  94*  --   --  73*  --   --   APTT  --   --   --   --   --   --   --   --  42* 109*  LABPROT  --   --   --   --   --   --  16.5* 16.0*  --   --   INR  --   --   --   --   --   --  1.37 1.31  --   --   HEPARINUNFRC  --   < >  --  0.12*  --  <0.10*  --  <0.10*  --   --   CREATININE  --   --  4.04*  --   --   --  3.48* 2.96*  --   --   TROPONINI <0.30  --   --   --   --   --   --   --   --   --   < > = values in this interval not displayed.  Estimated Creatinine Clearance: 47.5 ml/min (by C-G formula based on Cr of 2.96).   Medical History: Past Medical History  Diagnosis Date  . Other and unspecified hyperlipidemia   . Insomnia, unspecified   . Obstructive sleep apnea (adult) (pediatric)   . Allergic rhinitis, cause unspecified   . Nonischemic cardiomyopathy   . Unspecified essential hypertension   . Type II or unspecified type diabetes mellitus without mention of complication, not stated as uncontrolled   . CKD (chronic kidney disease) stage 3, GFR 30-59 ml/min   . Esophageal reflux   . Morbid obesity   . Dysuria   . Tobacco use disorder   . Dysrhythmia   . Pacemaker    . Antral ulcer   . Renal azotemia   . Arthritis     Gout w/hyperuricemia  . Morbid obesity     Medications:  Scheduled:  . cephALEXin  250 mg Oral Q2200  . doxercalciferol  2 mcg Intravenous Q T,Th,Sa-HD  . febuxostat  80 mg Oral Daily  . ferric gluconate (FERRLECIT/NULECIT) IV  125 mg Intravenous Q T,Th,Sa-HD  . insulin aspart  0-9 Units Subcutaneous TID WC  . insulin glargine  17 Units Subcutaneous QHS  . metoprolol tartrate  50 mg Oral TID  . pantoprazole  40 mg Oral Daily  .  simvastatin  40 mg Oral QPM  . sodium chloride  3 mL Intravenous Q12H   Assessment: 47 yo male with complicated PMH including ESRD, new - onset afib and heart failure.  Has been receiving IV heparin for afib, as well as some heparin in HD as well.  Platelet count 213 at baseline, and is now down to 73.  HIT panel ordered.  Pharmacy asked to begin argatroban.  Planning to infuse argatroban through HD catheter.  Draw labs from opposite arm.  Spoke to RN at 2045, argatroban 50 ml  bottle empty after running x 2 hrs at 9.3 ml/hr.  Suspected extra volume lost during priming of IV tubing.    Initial PTT above goal at 109.  No bleeding or complications noted per chart notes.  Goal of Therapy:  aPTT 50-90 seconds Monitor platelets by anticoagulation protocol: Yes   Plan:  1. Reduce argatroban infusion rate to 0.7 mcg/kg/hr (30% reduction). 2. Recheck PTT in 2 hrs. 3. Monitor for any bleeding complications. 4. F/u HIT panel results.  Tad Moore, BCPS  Clinical Pharmacist Pager [phone]  09/17/2012 9:49 PM

## 2012-09-17 NOTE — Progress Notes (Signed)
Pt does not wish to wear CPAP mask. Pt states it is not comfortable. RT encouraged pt to call if pt changes mind. No distress noted. Pt resting comfortably on [place].

## 2012-09-17 NOTE — Consult Note (Signed)
WOC consult Note Reason for Consult: placement of bilateral 4-layer compression wraps on the bilateral LE.  Dr. Lajoyce Corners has evaluated patient and ordered bilateral wraps to be placed after doppler studies.  However noted that these can only be done as outpatient, I have verified with Dr. Lajoyce Corners to proceed with placement.  Wound type: several open weeping areas over both legs, one wound noted dorsal left foot Measurement: left dorsal foot: 2.4mc x 2.0cm x 0.2cm  Wound WUJ:WJXBJY, slough Drainage (amount, consistency, odor) minimal Periwound:intact Dressing procedure/placement/frequency: 4 layer compression wraps (Profore) applied to patient. He is able to flex and extend the foot. I have explained the rationale for the compression. He understands the need for compression and if these wraps get too tight to have nursing staff evaluate.  He will need bilateral compression wraps changed next Tuesday and then 1x wk.  Will need HHRN at the time of discharge for changes unless he will be having changes performed in Dr. Audrie Lia office.  WOC will follow along with you Armen Pickup RN,CWOCN [phone]

## 2012-09-17 NOTE — Progress Notes (Signed)
[name] Progress NOTE    Subjective: Pt still having trouble breathing when lying flat intermittently.  Denies chest pain.  He c/o new diet today asking where 's the bread   Current medications: Current Facility-Administered Medications  Medication Dose Route Frequency Provider Last Rate Last Dose  . 0.9 %  sodium chloride infusion  100 mL Intravenous PRN Lauris Poag, MD      . 0.9 %  sodium chloride infusion  100 mL Intravenous PRN Lauris Poag, MD      . 0.9 %  sodium chloride infusion  250 mL Intravenous PRN Na Dierdre Searles, MD      . acetaminophen (TYLENOL) tablet 650 mg  650 mg Oral Q6H PRN Jonah Blue, DO      . cephALEXin (KEFLEX) capsule 250 mg  250 mg Oral Q2200 Marjan Rabbani, MD   250 mg at 09/16/12 2154  . diclofenac sodium (VOLTAREN) 1 % transdermal gel 2 g  2 g Topical QID PRN Jonah Blue, DO      . docusate sodium (COLACE) capsule 100 mg  100 mg Oral BID PRN Jonah Blue, DO      . doxercalciferol (HECTOROL) injection 2 mcg  2 mcg Intravenous Q T,Th,Sa-HD Irena Cords, MD   2 mcg at 09/16/12 1616  . febuxostat (ULORIC) tablet 80 mg  80 mg Oral Daily Jonah Blue, DO   80 mg at 09/16/12 1852  . ferric gluconate (NULECIT) 125 mg in sodium chloride 0.9 % 100 mL IVPB  125 mg Intravenous Q T,Th,Sa-HD Irena Cords, MD   125 mg at 09/16/12 1617  . heparin ADULT infusion 100 units/mL (25000 units/250 mL)  2,350 Units/hr Intravenous Continuous Fayne Norrie, RPH 23.5 mL/hr at 09/16/12 1843 2,350 Units/hr at 09/16/12 1843  . insulin aspart (novoLOG) injection 0-9 Units  0-9 Units Subcutaneous TID WC Linward Headland, MD   2 Units at 09/17/12 [phone]  . insulin glargine (LANTUS) injection 15 Units  15 Units Subcutaneous QHS Dede Query, MD   15 Units at 09/16/12 2153  . lidocaine (PF) (XYLOCAINE) 1 % injection 5 mL  5 mL Intradermal PRN Lauris Poag, MD      . lidocaine-prilocaine (EMLA) cream 1 application  1 application Topical PRN Lauris Poag, MD       . LORazepam (ATIVAN) tablet 0.5 mg  0.5 mg Oral BID PRN Jonah Blue, DO   0.5 mg at 09/11/12 1334  . metoprolol tartrate (LOPRESSOR) tablet 25 mg  25 mg Oral QID Marinus Maw, MD   25 mg at 09/16/12 2153  . ondansetron (ZOFRAN) injection 4 mg  4 mg Intravenous PRN Marjan Rabbani, MD      . pantoprazole (PROTONIX) EC tablet 40 mg  40 mg Oral Daily Linward Headland, MD   40 mg at 09/16/12 1852  . pentafluoroprop-tetrafluoroeth (GEBAUERS) aerosol 1 application  1 application Topical PRN Lauris Poag, MD      . simvastatin (ZOCOR) tablet 40 mg  40 mg Oral QPM Linward Headland, MD   40 mg at 09/16/12 1852  . sodium chloride 0.9 % injection 3 mL  3 mL Intravenous Q12H Na Li, MD   3 mL at 09/14/12 2122  . sodium chloride 0.9 % injection 3 mL  3 mL Intravenous PRN Na Li, MD   3 mL at 09/10/12 1137  . traMADol (ULTRAM) tablet 50 mg  50 mg Oral Q12H PRN Na Dierdre Searles, MD   50 mg at  09/12/12 2233  . warfarin (COUMADIN) tablet 7.5 mg  7.5 mg Oral q1800 Marinus Maw, MD   7.5 mg at 09/16/12 1850  . Warfarin - Physician Dosing Inpatient   Does not apply q1800 Herby Abraham, [facility]         Vital Signs: Blood pressure 103/57, pulse 102, temperature 99.5 F (37.5 C), temperature source Oral, resp. rate 20, height 6\' 1"  (1.854 m), weight 342 lb 13 oz (155.5 kg), SpO2 99.00%.  Weight trends: Filed Weights   09/15/12 2100 09/16/12 1245 09/16/12 1706  Weight: 343 lb 11.2 oz (155.9 kg) 348 lb 5.2 oz (158 kg) 342 lb 13 oz (155.5 kg)    Physical Exam: General: Vital signs reviewed and noted. Well-developed, obese, in no acute distress;  appropriate and cooperative throughout examination.  Head: Normocephalic, atraumatic.  Lungs:  Ctab  Heart: Slightly tachycardic, no murmurs   Abdomen:  BS normoactive. Soft, obese, non-tender.    Extremities: B/l 1+ edema, no cyanosis, hyperpigmentation and thickening to lower extremities. Left great toe with wound  Neurologic: Alert and oriented x 3   Skin: hyperpigmentation  and thickening to lower extremities    Lab results: Basic Metabolic Panel:  Recent Labs Lab 09/11/12 0631 09/13/12 1200 09/14/12 0430 09/14/12 1457 09/15/12 0500 09/16/12 0905 09/17/12 0640  NA 137 134* 136 135 136 134* 136  K 3.6 3.7 3.4* 3.7 3.7 3.7 4.0  CL 103 99 99 99 99 98 99  CO2 20 21 22 20 23 23 24   GLUCOSE 99 147* 123* 148* 150* 200* 173*  BUN 83* 73* 57* 59* 65* 48* 34*  CREATININE 3.58* 3.97* 3.51* 3.61* 4.04* 3.48* 2.96*  CALCIUM 9.0 9.0 9.0 9.0 9.1 8.8 9.0  MG  --  2.1 2.0 2.0 1.9  --   --   PHOS 4.8* 4.1  --   --  3.5 2.9 2.6    Liver Function Tests:  Recent Labs Lab 09/15/12 0500 09/16/12 0905 09/17/12 0640  ALBUMIN 3.1* 3.0* 3.1*    CBC:  Recent Labs Lab 09/13/12 1200 09/14/12 0430 09/15/12 0500 09/16/12 0525 09/17/12 0640  WBC 6.4 7.3 9.3 9.9 9.1  HGB 8.2* 8.2* 8.2* 8.0* 7.7*  HCT 26.8* 26.0* 26.1* 25.8* 25.3*  MCV 86.5 85.8 86.4 86.3 88.5  PLT 180 136* 131* 94* 73*   CBG:  Recent Labs Lab 09/16/12 0809 09/16/12 1225 09/16/12 1808 09/16/12 2115 09/17/12 0741  GLUCAP 166* 175* 130* 175* 159*     Imaging: No results found.   Assessment & Plan: Pt is a 47 y.o. 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 He presented to the [facility] with weight gain of 50 lbs during the last month (epic review shows weight 316-->350 lbs), increased shortness of breath, lower extremity edema and abdominal bloating.  He recently had a right AVF placed on 08/31/12.  1. Fluid overload secondary to end stage renal disease  -Renal following while inpatient -will get HD outpatient at [facility] -renal to dose Aranesp, Hectoral, Fe -trend daily weights and i/o's   2. 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 -cardiology consulted per  cards hold Dig with HD. Will need f/u outpatient   3. Possible Atrial fibrillation  -on heparin gtt per cards, Coumadin.  Lopressor 25 mg qid.    4. Diabetes 2 uncontrolled (HA1C 9.1 in 07/2012)  -hyperglycemia intermittently. primary team addressing  -Lantus 15 units qhs, SSI  5. Hypotension, improved  -Etiology could be related to anemia, medications, heart failure history.   -monitor BP still soft intermittently  6. Left great toe infection -Continue Abx x 7 days total per ortho   7. F/E/N -renal diet   8. DVT PPX  - heparin gtt , Coumadin  Desma Maxim MD PGY 2 IM Discussed with attending

## 2012-09-17 NOTE — Progress Notes (Addendum)
ANTICOAGULATION CONSULT NOTE - Initial Consult  Pharmacy Consult for Argatroban Indication: R/O HIT; afib  Allergies  Allergen Reactions  . Ace Inhibitors     Acute renal failure with multiple trials    Patient Measurements: Height: 6\' 1"  (185.4 cm) Weight: 342 lb 13 oz (155.5 kg) IBW/kg (Calculated) : 79.9 Heparin Dosing Weight: n/a  Vital Signs: Temp: 98.2 F (36.8 C) (08/22 1439) Temp src: Oral (08/22 1439) BP: 107/71 mmHg (08/22 1439) Pulse Rate: 97 (08/22 1439)  Labs:  Recent Labs  09/14/12 2230  09/15/12 0500 09/15/12 2057 09/16/12 0525 09/16/12 0530 09/16/12 0905 09/17/12 0640  HGB  --   < > 8.2*  --  8.0*  --   --  7.7*  HCT  --   --  26.1*  --  25.8*  --   --  25.3*  PLT  --   --  131*  --  94*  --   --  73*  LABPROT  --   --   --   --   --   --  16.5* 16.0*  INR  --   --   --   --   --   --  1.37 1.31  HEPARINUNFRC  --   < >  --  0.12*  --  <0.10*  --  <0.10*  CREATININE  --   --  4.04*  --   --   --  3.48* 2.96*  TROPONINI <0.30  --   --   --   --   --   --   --   < > = values in this interval not displayed.  Estimated Creatinine Clearance: 47.5 ml/min (by C-G formula based on Cr of 2.96).   Medical History: Past Medical History  Diagnosis Date  . Other and unspecified hyperlipidemia   . Insomnia, unspecified   . Obstructive sleep apnea (adult) (pediatric)   . Allergic rhinitis, cause unspecified   . Nonischemic cardiomyopathy   . Unspecified essential hypertension   . Type II or unspecified type diabetes mellitus without mention of complication, not stated as uncontrolled   . CKD (chronic kidney disease) stage 3, GFR 30-59 ml/min   . Esophageal reflux   . Morbid obesity   . Dysuria   . Tobacco use disorder   . Dysrhythmia   . Pacemaker   . Antral ulcer   . Renal azotemia   . Arthritis     Gout w/hyperuricemia  . Morbid obesity     Medications:  Scheduled:  . cephALEXin  250 mg Oral Q2200  . doxercalciferol  2 mcg Intravenous Q  T,Th,Sa-HD  . febuxostat  80 mg Oral Daily  . ferric gluconate (FERRLECIT/NULECIT) IV  125 mg Intravenous Q T,Th,Sa-HD  . insulin aspart  0-9 Units Subcutaneous TID WC  . insulin glargine  17 Units Subcutaneous QHS  . metoprolol tartrate  50 mg Oral TID  . pantoprazole  40 mg Oral Daily  . simvastatin  40 mg Oral QPM  . sodium chloride  3 mL Intravenous Q12H   Assessment: 47 yo male with complicated PMH including ESRD, new - onset afib and heart failure.  Has been receiving IV heparin for afib, as well as some heparin in HD as well.  Platelet count 213 at baseline, and is now down to 73.  HIT panel ordered.  Pharmacy asked to begin argatroban.  Planning to infuse argatroban through HD catheter.  Draw labs from opposite arm.  Goal of Therapy:  aPTT 50-90 seconds Monitor platelets by anticoagulation protocol: Yes   Plan:  1. Obtain baseline PTT now 2. Will start argatroban at 1 mcg/kg/min based on actual body weight. (155 mcg/min). 3. Check PTT 2 hrs after argatroban infusion started. 4. Monitor for bleeding.  Tad Moore, BCPS  Clinical Pharmacist Pager [phone]  09/17/2012 5:20 PM   Addendum: Baseline PTT 42.  Argatroban infusion started at 1840 PM.  Will check PTT at 2045 PM.  Tad Moore, BCPS  Clinical Pharmacist Pager [phone]  09/17/2012 6:48 PM

## 2012-09-17 NOTE — Clinical Social Work Note (Signed)
Patient requested assisted with transportation to/from dialysis. CSW explained and provided patient with information on [employer]. He will make call today to get process started.  Genelle Bal, MSW, LCSW [phone]

## 2012-09-17 NOTE — Telephone Encounter (Signed)
Called to pharm 

## 2012-09-17 NOTE — Progress Notes (Signed)
Talked to patient about discharge plan. Patient lives alone and is having a difficult time with mobility and need ongoing education; Home Health Care choices offered, patient chose [facility]. Mary with [facility] called for arrangements. Patient stated that he also needs some one to cook and clean for him, CM informed patient that he would have to privately pay for that services and that [facility] does not do that. CM encouraged patient to reach out to his friends/ family members/ church members for additional assistance; B [employer]

## 2012-09-17 NOTE — Progress Notes (Signed)
ANTICOAGULATION CONSULT NOTE - Initial Consult  Pharmacy Consult for Warfarin Indication: new onset atrial fibrillation  Allergies  Allergen Reactions  . Ace Inhibitors     Acute renal failure with multiple trials    Patient Measurements: Height: 6\' 1"  (185.4 cm) Weight: 342 lb 13 oz (155.5 kg) IBW/kg (Calculated) : 79.9   Vital Signs: Temp: 98.2 F (36.8 C) (08/22 0913) Temp src: Oral (08/22 0913) BP: 118/72 mmHg (08/22 0913) Pulse Rate: 104 (08/22 0913)  Labs:  Recent Labs  09/14/12 2230  09/15/12 0500 09/15/12 2057 09/16/12 0525 09/16/12 0530 09/16/12 0905 09/17/12 0640  HGB  --   < > 8.2*  --  8.0*  --   --  7.7*  HCT  --   --  26.1*  --  25.8*  --   --  25.3*  PLT  --   --  131*  --  94*  --   --  73*  LABPROT  --   --   --   --   --   --  16.5* 16.0*  INR  --   --   --   --   --   --  1.37 1.31  HEPARINUNFRC  --   < >  --  0.12*  --  <0.10*  --  <0.10*  CREATININE  --   --  4.04*  --   --   --  3.48* 2.96*  TROPONINI <0.30  --   --   --   --   --   --   --   < > = values in this interval not displayed.  Estimated Creatinine Clearance: 47.5 ml/min (by C-G formula based on Cr of 2.96).   Medical History: Past Medical History  Diagnosis Date  . Other and unspecified hyperlipidemia   . Insomnia, unspecified   . Obstructive sleep apnea (adult) (pediatric)   . Allergic rhinitis, cause unspecified   . Nonischemic cardiomyopathy   . Unspecified essential hypertension   . Type II or unspecified type diabetes mellitus without mention of complication, not stated as uncontrolled   . CKD (chronic kidney disease) stage 3, GFR 30-59 ml/min   . Esophageal reflux   . Morbid obesity   . Dysuria   . Tobacco use disorder   . Dysrhythmia   . Pacemaker   . Antral ulcer   . Renal azotemia   . Arthritis     Gout w/hyperuricemia  . Morbid obesity     Medications:  Scheduled:  . cephALEXin  250 mg Oral Q2200  . doxercalciferol  2 mcg Intravenous Q T,Th,Sa-HD  .  febuxostat  80 mg Oral Daily  . ferric gluconate (FERRLECIT/NULECIT) IV  125 mg Intravenous Q T,Th,Sa-HD  . insulin aspart  0-9 Units Subcutaneous TID WC  . insulin glargine  15 Units Subcutaneous QHS  . metoprolol tartrate  50 mg Oral TID  . pantoprazole  40 mg Oral Daily  . simvastatin  40 mg Oral QPM  . sodium chloride  3 mL Intravenous Q12H  . warfarin  7.5 mg Oral q1800  . Warfarin - Pharmacist Dosing Inpatient   Does not apply q1800    Assessment: 47 y/o M, recent onset a fib with hx of CKD on HD.  Pt was started on heparin on 8/15, but due to decreases in PLT (230 on 8/15 to 73 on 8/22), heparin was d/c on 8/22 and HIT panel ordered.  Pt was started on warfarin on 8/20 for MD  dosing.  Order has been changed for per pharmacy dosing per Dr. Kem Kays on 8/22.  Pt has been subtherapeutic x2, and INR today is at 1.31.  No more signs and symptoms of bleeding noted in notes.  Will increase warfarin dose today.  Goal of Therapy:  INR 2-3 Monitor platelets by anticoagulation protocol: Yes   Plan:  1. Increase warfarin dose to 10 mg tonight at 1800 2. Check daily INR 3. Monitor h/h, plt, s/s of bleeing  Anabel Bene 09/17/2012,11:51 AM

## 2012-09-17 NOTE — Progress Notes (Signed)
Subjective: Patient says he is still SOB  No CP Objective: Filed Vitals:   09/16/12 1658 09/16/12 1706 09/16/12 2111 09/17/12 0519  BP: 119/69 108/64 111/75 103/57  Pulse: 108 104 62 102  Temp:  98.1 F (36.7 C) 99.9 F (37.7 C) 99.5 F (37.5 C)  TempSrc:  Oral Oral Oral  Resp: 15 23 22 20   Height:      Weight:  342 lb 13 oz (155.5 kg)    SpO2:  100% 97% 99%   Weight change: -2 lb 10.3 oz (-1.2 kg)  Intake/Output Summary (Last 24 hours) at 09/17/12 0755 Last data filed at 09/17/12 0700  Gross per 24 hour  Intake 1019.72 ml  Output   2954 ml  Net -1934.28 ml    General: Alert, awake, oriented x3, in no acute distress Neck: Rales at bases. Heart: Irregular rate and rhythm, without murmurs, rubs, gallops.  Lungs: Rales at bases Exemities:  1+ edema.   Neuro: Grossly intact, nonfocal.  Tele:  Afib   100 Lab Results: Results for orders placed during the hospital encounter of 09/09/12 (from the past 24 hour(s))  GLUCOSE, CAPILLARY     Status: Abnormal   Collection Time    09/16/12  8:09 AM      Result Value Range   Glucose-Capillary 166 (*) 70 - 99 mg/dL  PROTIME-INR     Status: Abnormal   Collection Time    09/16/12  9:05 AM      Result Value Range   Prothrombin Time 16.5 (*) 11.6 - 15.2 seconds   INR 1.37  0.00 - 1.49  RENAL FUNCTION PANEL     Status: Abnormal   Collection Time    09/16/12  9:05 AM      Result Value Range   Sodium 134 (*) 135 - 145 mEq/L   Potassium 3.7  3.5 - 5.1 mEq/L   Chloride 98  96 - 112 mEq/L   CO2 23  19 - 32 mEq/L   Glucose, Bld 200 (*) 70 - 99 mg/dL   BUN 48 (*) 6 - 23 mg/dL   Creatinine, Ser 1.61 (*) 0.50 - 1.35 mg/dL   Calcium 8.8  8.4 - 09.6 mg/dL   Phosphorus 2.9  2.3 - 4.6 mg/dL   Albumin 3.0 (*) 3.5 - 5.2 g/dL   GFR calc non Af Amer 19 (*) >90 mL/min   GFR calc Af Amer 22 (*) >90 mL/min  GLUCOSE, CAPILLARY     Status: Abnormal   Collection Time    09/16/12 12:25 PM      Result Value Range   Glucose-Capillary 175 (*) 70 -  99 mg/dL  GLUCOSE, CAPILLARY     Status: Abnormal   Collection Time    09/16/12  6:08 PM      Result Value Range   Glucose-Capillary 130 (*) 70 - 99 mg/dL  GLUCOSE, CAPILLARY     Status: Abnormal   Collection Time    09/16/12  9:15 PM      Result Value Range   Glucose-Capillary 175 (*) 70 - 99 mg/dL  CBC     Status: Abnormal   Collection Time    09/17/12  6:40 AM      Result Value Range   WBC 9.1  4.0 - 10.5 K/uL   RBC 2.86 (*) 4.22 - 5.81 MIL/uL   Hemoglobin 7.7 (*) 13.0 - 17.0 g/dL   HCT 04.5 (*) 40.9 - 81.1 %   MCV 88.5  78.0 - 100.0 fL  MCH 26.9  26.0 - 34.0 pg   MCHC 30.4  30.0 - 36.0 g/dL   RDW 09.8 (*) 11.9 - 14.7 %   Platelets 73 (*) 150 - 400 K/uL  GLUCOSE, CAPILLARY     Status: Abnormal   Collection Time    09/17/12  7:41 AM      Result Value Range   Glucose-Capillary 159 (*) 70 - 99 mg/dL   Comment 1 Documented in Chart     Comment 2 Notify RN      Studies/Results: @RISRSLT24 @  Medications:Reviewed   @PROBHOSP @  1.  Atrial fibrillation  Rates are not controlled  Will increase metoprolol to 50 tid I have discussed with EP  WOuld not add any other agents for now (Dig or Amio)  2.  Acute on chronic systolic CHF  Still with incresased volume on exam  Continue dialyssi  3.  CKD  Continue dialysis.    LOS: 8 days   Dietrich Pates 09/17/2012, 7:55 AM

## 2012-09-17 NOTE — Progress Notes (Addendum)
ANTICOAGULATION CONSULT NOTE - Follow Up Consult  Pharmacy Consult for Heparin (with warfarin per MD) Indication: atrial fibrillation  Allergies  Allergen Reactions  . Ace Inhibitors     Acute renal failure with multiple trials    Patient Measurements: Height: 6\' 1"  (185.4 cm) Weight: 342 lb 13 oz (155.5 kg) IBW/kg (Calculated) : 79.9 Heparin Dosing Weight: 117 kg  Vital Signs: Temp: 98.2 F (36.8 C) (08/22 0913) Temp src: Oral (08/22 0913) BP: 118/72 mmHg (08/22 0913) Pulse Rate: 104 (08/22 0913)  Labs:  Recent Labs  09/14/12 2230  09/15/12 0500 09/15/12 2057 09/16/12 0525 09/16/12 0530 09/16/12 0905 09/17/12 0640  HGB  --   < > 8.2*  --  8.0*  --   --  7.7*  HCT  --   --  26.1*  --  25.8*  --   --  25.3*  PLT  --   --  131*  --  94*  --   --  73*  LABPROT  --   --   --   --   --   --  16.5* 16.0*  INR  --   --   --   --   --   --  1.37 1.31  HEPARINUNFRC  --   < >  --  0.12*  --  <0.10*  --  <0.10*  CREATININE  --   --  4.04*  --   --   --  3.48* 2.96*  TROPONINI <0.30  --   --   --   --   --   --   --   < > = values in this interval not displayed.  Estimated Creatinine Clearance: 47.5 ml/min (by C-G formula based on Cr of 2.96).   Medications:  Heparin drip at 2350 units/hr  Assessment: 47 y/o M on heparin for A-fib. Pt has limited access and must have short stoppage of heparin drip for draws. Pt has had multiple undetectable levels and one level that was >2.2 that was drawn while heparin was still infusing, as well as one level of 0.12. Not one single heparin level has been drawn correctly. This patient is challenging to manage and we will do the best we can. This morning the heparin was supposed to be stopped for 15-30 minutes for  HL at 0400. The HL was not collected until 0640 and the heparin was not restarted until after 0700 per RN. We cannot keep stopping the heparin for 2-3 hours multiple times per day for HL re-checks. Will continue heparin at current  rate and order daily HL's at 0500 and hopefully we can get the labs drawn correctly (HL should be drawn 15-30 minutes after heparin is turned off, with immediate re-starting of heparin.   -ESRD on HD -Platelets have dropped 73<94<131<136<180, with any further drop 8/23 would contact cardiology/primary team to discuss options. -No marked bleeding noted, some nasal irritation with some insignificant bleeding likely from , would monitor  Goal of Therapy:  Heparin level 0.3-0.7 units/ml Monitor platelets by anticoagulation protocol: Yes   Plan:  -Continue heparin infusion at 2350 units/hr -Daily CBC/HL -Monitor for bleeding -Monitor PLTS closely  -Warfarin per MD (INR is 1.31<1.37 on 7.5 mg/day)  Thank you for allowing me to take part in this patient's care,  Abran Duke, PharmD Clinical Pharmacist Phone: [phone] Pager: [phone] 09/17/2012 10:09 AM  Addendum: 12:09 PM IM team electing to stop heparin today and evaluate for HIT. Spoke with Cardiology (Dr. Tenny Craw), can  leave on warfarin alone for now without alternative agent bridging with indication for new onset atrial fibrillation.   Wilmer Floor, PharmD Pager [phone] @TD

## 2012-09-17 NOTE — Progress Notes (Signed)
  Date: 09/17/2012  Patient name: Adam [name]  Medical record number: [id number]  Date of birth: 09/07/1965   This patient has been seen and the plan of care was discussed with the house staff. Please see their note for complete details. I concur with their findings with the following additions/corrections: Feels better. No SOB. No CP. He is getting dressing changes to LE by wound care.  My main concern at this time is a +50% drop in platelets count since 8/18 and recent treatment with heparin in this patient. At this time, stop heparin.  Send HIT panel.  I will go ahead and postpone treatment with coumadin until this is sorted out.  Given the timing of decrease in Plt and the % drop, he has an intermediate probability of HIT. He shows no other evidence of thrombosis on exam. Discussed with pharmacy, best option at this time is to not give coumadin and start argatroban in the mean time, and I agree fully. Nephrology is following and plans for continued HD.    Jonah Blue, DO, FACP Faculty [facility] 09/17/2012, 1:39 PM

## 2012-09-17 NOTE — Progress Notes (Signed)
I have seen and examined this patient and agree with plan as outlined by Dr. Shirlee Latch.  Pt is now having thrombocytopenia.  Would recommend stopping heparin gtt and check HIT panel.  Follow H/H and plts.  Also need to decide if alteratives to coumadin/hep may be possibility if felt to be necessary. Harjot Zavadil A,MD 09/17/2012 11:30 AM

## 2012-09-17 NOTE — Progress Notes (Signed)
Pt refuses to wear CPAP. Pt states that mask is too small. Pt currently has the largest mask our department carries. Pt encouraged to bring in home mask if possible. No distress noted. Pt encouraged to call RT if pt changes mind.

## 2012-09-17 NOTE — Progress Notes (Signed)
Dr. Isa Rankin okay with accessing HD catheter to monitor Argatroban levels for now.    Shirlee Latch MD [phone]

## 2012-09-18 DIAGNOSIS — D696 Thrombocytopenia, unspecified: Secondary | ICD-10-CM

## 2012-09-18 DIAGNOSIS — R0609 Other forms of dyspnea: Secondary | ICD-10-CM

## 2012-09-18 LAB — CBC
HCT: 25.4 % — ABNORMAL LOW (ref 39.0–52.0)
Hemoglobin: 7.8 g/dL — ABNORMAL LOW (ref 13.0–17.0)
MCH: 26.8 pg (ref 26.0–34.0)
MCHC: 30.7 g/dL (ref 30.0–36.0)
MCHC: 30.7 g/dL (ref 30.0–36.0)
Platelets: 112 10*3/uL — ABNORMAL LOW (ref 150–400)
RDW: 17.7 % — ABNORMAL HIGH (ref 11.5–15.5)
WBC: 8.4 10*3/uL (ref 4.0–10.5)

## 2012-09-18 LAB — RENAL FUNCTION PANEL
BUN: 44 mg/dL — ABNORMAL HIGH (ref 6–23)
Calcium: 9.2 mg/dL (ref 8.4–10.5)
Glucose, Bld: 158 mg/dL — ABNORMAL HIGH (ref 70–99)
Phosphorus: 3.3 mg/dL (ref 2.3–4.6)
Potassium: 4.1 mEq/L (ref 3.5–5.1)

## 2012-09-18 LAB — COMPREHENSIVE METABOLIC PANEL
ALT: <5 U/L (ref 0–53)
AST: 13 U/L (ref 0–37)
Albumin: 3 g/dL — ABNORMAL LOW (ref 3.5–5.2)
CO2: 24 mEq/L (ref 19–32)
Calcium: 9 mg/dL (ref 8.4–10.5)
Chloride: 97 mEq/L (ref 96–112)
Creatinine, Ser: 4.26 mg/dL — ABNORMAL HIGH (ref 0.50–1.35)
GFR calc non Af Amer: 15 mL/min — ABNORMAL LOW (ref >90)
Sodium: 134 mEq/L — ABNORMAL LOW (ref 135–145)
Total Bilirubin: 3.2 mg/dL — ABNORMAL HIGH (ref 0.3–1.2)

## 2012-09-18 LAB — MAGNESIUM: Magnesium: 2 mg/dL (ref 1.5–2.5)

## 2012-09-18 LAB — MRSA PCR SCREENING: MRSA by PCR: NEGATIVE

## 2012-09-18 LAB — APTT: aPTT: 128 seconds — ABNORMAL HIGH (ref 24–37)

## 2012-09-18 LAB — TROPONIN I: Troponin I: <0.3 ng/mL (ref <0.30)

## 2012-09-18 LAB — RETICULOCYTES: Retic Count, Absolute: 91.8 10*3/uL (ref 19.0–186.0)

## 2012-09-18 LAB — GLUCOSE, CAPILLARY: Glucose-Capillary: 176 mg/dL — ABNORMAL HIGH (ref 70–99)

## 2012-09-18 MED ORDER — DOXERCALCIFEROL 4 MCG/2ML IV SOLN
INTRAVENOUS | Status: AC
  Administered 2012-09-18: 2 ug via INTRAVENOUS
  Filled 2012-09-18: qty 2

## 2012-09-18 MED ORDER — ARGATROBAN 50 MG/50ML IV SOLN
0.1200 ug/kg/min | INTRAVENOUS | Status: DC
  Administered 2012-09-18: 0.12 ug/kg/min via INTRAVENOUS
  Filled 2012-09-18: qty 50

## 2012-09-18 MED ORDER — ARGATROBAN 50 MG/50ML IV SOLN
0.3500 ug/kg/min | INTRAVENOUS | Status: DC
  Filled 2012-09-18 (×2): qty 50

## 2012-09-18 NOTE — Progress Notes (Signed)
Late entry:09/18/12 1337 nsg Around 1238 RN received    telemetry alert re: ventricular fib on patient RN went to room to check patient he was getting ready to eat. Called Telemetry and said she will mark the time for [place] to print his teletmery strips. Vital signs were done called pt continued to be tachycardic v/s 99.3 111 97/59. MD paged re arrythmia and ordered EKG on patient and stat labs. Dr. Dierdre Searles came to see patient per patient he said he felt his heart is racing denies any dizziness. Cardiology was consulted  Dr. Melburn Popper came to see the heart rhythms and agreed for step down ; pharmacy was consulted re: argotoban which the patient was on and said it may cause dysrhythmias. Nephrology was notified by Dr. Dierdre Searles as well. Cardoilogy agreed that patient needs to go to dialysis first before moving to step down 2900. Pt was then transported to dialysis after stat blood draws Dr. Dierdre Searles notified. Report was given to RN. Rapid response notified as well. Called contact person his brother RN left message.

## 2012-09-18 NOTE — Progress Notes (Signed)
ANTICOAGULATION CONSULT NOTE - Follow-Up Consult  Pharmacy Consult for Argatroban Indication: R/O HIT; afib  Allergies  Allergen Reactions  . Ace Inhibitors     Acute renal failure with multiple trials  . Heparin     HIT panel pending 8/22    Patient Measurements: Height: 6\' 1"  (185.4 cm) Weight: 347 lb 10.7 oz (157.7 kg) IBW/kg (Calculated) : 79.9   Vital Signs: Temp: 97.2 F (36.2 C) (08/23 0857) Temp src: Oral (08/23 0413) BP: 126/76 mmHg (08/23 0857) Pulse Rate: 95 (08/23 0857)  Labs:  Recent Labs  09/15/12 2057  09/16/12 0525 09/16/12 0530 09/16/12 0905 09/17/12 0640  09/17/12 2338 09/18/12 0450 09/18/12 0900  HGB  --   < > 8.0*  --   --  7.7*  --   --  7.8*  --   HCT  --   --  25.8*  --   --  25.3*  --   --  25.4*  --   PLT  --   --  94*  --   --  73*  --   --  110*  --   APTT  --   --   --   --   --   --   < > 128* 136* 122*  LABPROT  --   --   --   --  16.5* 16.0*  --   --   --   --   INR  --   --   --   --  1.37 1.31  --   --   --   --   HEPARINUNFRC 0.12*  --   --  <0.10*  --  <0.10*  --   --   --   --   CREATININE  --   --   --   --  3.48* 2.96*  --   --  4.00*  --   < > = values in this interval not displayed.  Estimated Creatinine Clearance: 35.5 ml/min (by C-G formula based on Cr of 4).  Assessment: 47 yo male with complicated PMH including ESRD, new - onset afib and heart failure. Was on IV heparin and platelets decreased >50%-Pharmacy asked to begin argatroban. Argatroban is infusing through HD catheter.  Draw labs from opposite arm.  PTT was elevated last night and again early this morning. A PTT recheck after the rate was decreased to 0.52mcg/kg/min this morning was still elevated at 122 seconds. Discussed with RN and patient is to be picked up for HD between 1200-1400 today. No bleeding noted. She will call pharmacy when he is taken to HD and when he returns to the floor.  Goal of Therapy:  aPTT 50-90 seconds Monitor platelets by  anticoagulation protocol: Yes   Plan:  1. Reduce argatroban infusion rate to 0.12 mcg/kg/hr (30% reduction). 2. Hold Argatroban during HD as it is infusing through HD catheter 3. Resume Argatroban after patient has completed HD- check PTT 2 hours after this time 4. Monitor for any bleeding complications. 5. F/u HIT panel results (panel drawn 8/22)   Emalynn Clewis D. Azure Budnick, PharmD Clinical Pharmacist Pager: [phone] 09/18/2012 11:41 AM

## 2012-09-18 NOTE — Progress Notes (Signed)
Subjective: Patient is a 47 yo with hx of dilated CM,( normal coronaries)  EF = 25%.  He has been diagnosed with atrial fib.  says he is still SOB  No CP. Hx of HTN, ESRD. Objective: Filed Vitals:   09/17/12 1439 09/17/12 1744 09/17/12 2157 09/18/12 0413  BP: 107/71 110/75 101/68 104/72  Pulse: 97 79 70 96  Temp: 98.2 F (36.8 C) 98.5 F (36.9 C) 99 F (37.2 C) 99 F (37.2 C)  TempSrc: Oral Oral Oral Oral  Resp: 19 18 20 20   Height:   6\' 1"  (1.854 m)   Weight:   347 lb 10.7 oz (157.7 kg)   SpO2: 92% 93% 96% 100%   Weight change: -10.6 oz (-0.3 kg)  Intake/Output Summary (Last 24 hours) at 09/18/12 0805 Last data filed at 09/17/12 2202  Gross per 24 hour  Intake    520 ml  Output      3 ml  Net    517 ml    General: Alert, awake, oriented x3, in no acute distress Neck: Rales at bases. Heart: Irregular rate and rhythm, without murmurs, rubs, gallops.  Lungs: Rales at bases Exemities:  1+ edema.   Neuro: Grossly intact, nonfocal.  Tele:  Afib   100 Lab Results: Results for orders placed during the hospital encounter of 09/09/12 (from the past 24 hour(s))  GLUCOSE, CAPILLARY     Status: Abnormal   Collection Time    09/17/12 11:33 AM      Result Value Range   Glucose-Capillary 163 (*) 70 - 99 mg/dL   Comment 1 Documented in Chart     Comment 2 Notify RN    GLUCOSE, CAPILLARY     Status: Abnormal   Collection Time    09/17/12  4:48 PM      Result Value Range   Glucose-Capillary 157 (*) 70 - 99 mg/dL   Comment 1 Documented in Chart     Comment 2 Notify RN    APTT     Status: Abnormal   Collection Time    09/17/12  6:00 PM      Result Value Range   aPTT 42 (*) 24 - 37 seconds  APTT     Status: Abnormal   Collection Time    09/17/12  8:55 PM      Result Value Range   aPTT 109 (*) 24 - 37 seconds  GLUCOSE, CAPILLARY     Status: Abnormal   Collection Time    09/17/12  9:53 PM      Result Value Range   Glucose-Capillary 153 (*) 70 - 99 mg/dL   Comment 1  Documented in Chart     Comment 2 Notify RN    APTT     Status: Abnormal   Collection Time    09/17/12 11:38 PM      Result Value Range   aPTT 128 (*) 24 - 37 seconds  APTT     Status: Abnormal   Collection Time    09/18/12  4:50 AM      Result Value Range   aPTT 136 (*) 24 - 37 seconds  CBC     Status: Abnormal   Collection Time    09/18/12  4:50 AM      Result Value Range   WBC 9.5  4.0 - 10.5 K/uL   RBC 2.91 (*) 4.22 - 5.81 MIL/uL   Hemoglobin 7.8 (*) 13.0 - 17.0 g/dL   HCT 16.1 (*) 09.6 -  52.0 %   MCV 87.3  78.0 - 100.0 fL   MCH 26.8  26.0 - 34.0 pg   MCHC 30.7  30.0 - 36.0 g/dL   RDW 57.8 (*) 46.9 - 62.9 %   Platelets 110 (*) 150 - 400 K/uL  RENAL FUNCTION PANEL     Status: Abnormal   Collection Time    09/18/12  4:50 AM      Result Value Range   Sodium 135  135 - 145 mEq/L   Potassium 4.1  3.5 - 5.1 mEq/L   Chloride 97  96 - 112 mEq/L   CO2 22  19 - 32 mEq/L   Glucose, Bld 158 (*) 70 - 99 mg/dL   BUN 44 (*) 6 - 23 mg/dL   Creatinine, Ser 5.28 (*) 0.50 - 1.35 mg/dL   Calcium 9.2  8.4 - 41.3 mg/dL   Phosphorus 3.3  2.3 - 4.6 mg/dL   Albumin 3.2 (*) 3.5 - 5.2 g/dL   GFR calc non Af Amer 16 (*) >90 mL/min   GFR calc Af Amer 19 (*) >90 mL/min  GLUCOSE, CAPILLARY     Status: Abnormal   Collection Time    09/18/12  7:20 AM      Result Value Range   Glucose-Capillary 139 (*) 70 - 99 mg/dL    Studies/Results:  Medications:Reviewed  1.  Atrial fibrillation  Rates are much better.  Continue current meds  2.  Acute on chronic systolic CHF.  Lungs are clear.  Legs are still edematous.    3.  CKD  Continue dialysis.    4. Possible HIT:  Continue Argatroban    LOS: 9 days   Elyn Aquas. 09/18/2012, 8:05 AM

## 2012-09-18 NOTE — Progress Notes (Signed)
I have seen and examined this patient and agree with plan as outlined by Dr. Shirlee Latch.  For HD today.  We discussed fluid and sodium restriction.  He may need 4 outpt HD sessions/week if his hypotension and tachyarrythmias cont to cause issues. Tanny Harnack A,MD 09/18/2012 9:54 AM

## 2012-09-18 NOTE — Progress Notes (Signed)
[facility] Progress NOTE    Subjective: Pt upset about not getting sleep last night. Still getting sob with exertion and lying flat. Denies chest pain   RN: overnight ST to 140s but then went back down after short period of time   Current medications: Current Facility-Administered Medications  Medication Dose Route Frequency Provider Last Rate Last Dose  . 0.9 %  sodium chloride infusion  100 mL Intravenous PRN Lauris Poag, MD      . 0.9 %  sodium chloride infusion  100 mL Intravenous PRN Lauris Poag, MD      . 0.9 %  sodium chloride infusion  250 mL Intravenous PRN Na Dierdre Searles, MD      . acetaminophen (TYLENOL) tablet 650 mg  650 mg Oral Q6H PRN Jonah Blue, DO   650 mg at 09/17/12 1518  . argatroban 1 mg/mL infusion  0.17 mcg/kg/min Intravenous Continuous Renaee Munda, RPH 1.6 mL/hr at 09/18/12 0712 0.17 mcg/kg/min at 09/18/12 9604  . cephALEXin (KEFLEX) capsule 250 mg  250 mg Oral Q2200 Marjan Rabbani, MD   250 mg at 09/17/12 2217  . diclofenac sodium (VOLTAREN) 1 % transdermal gel 2 g  2 g Topical QID PRN Jonah Blue, DO      . docusate sodium (COLACE) capsule 100 mg  100 mg Oral BID PRN Jonah Blue, DO      . doxercalciferol (HECTOROL) injection 2 mcg  2 mcg Intravenous Q T,Th,Sa-HD Irena Cords, MD   2 mcg at 09/16/12 1616  . febuxostat (ULORIC) tablet 80 mg  80 mg Oral Daily Jonah Blue, DO   80 mg at 09/17/12 1035  . ferric gluconate (NULECIT) 125 mg in sodium chloride 0.9 % 100 mL IVPB  125 mg Intravenous Q T,Th,Sa-HD Irena Cords, MD   125 mg at 09/16/12 1617  . insulin aspart (novoLOG) injection 0-9 Units  0-9 Units Subcutaneous TID WC Linward Headland, MD   2 Units at 09/17/12 1734  . insulin glargine (LANTUS) injection 17 Units  17 Units Subcutaneous QHS Otis Brace, MD   17 Units at 09/17/12 2217  . lidocaine (PF) (XYLOCAINE) 1 % injection 5 mL  5 mL Intradermal PRN Lauris Poag, MD      . lidocaine-prilocaine (EMLA) cream 1  application  1 application Topical PRN Lauris Poag, MD      . LORazepam (ATIVAN) tablet 0.5 mg  0.5 mg Oral BID PRN Jonah Blue, DO   0.5 mg at 09/11/12 1334  . metoprolol (LOPRESSOR) tablet 50 mg  50 mg Oral TID Pricilla Riffle, MD   50 mg at 09/17/12 2217  . ondansetron (ZOFRAN) injection 4 mg  4 mg Intravenous PRN Marjan Rabbani, MD      . pantoprazole (PROTONIX) EC tablet 40 mg  40 mg Oral Daily Linward Headland, MD   40 mg at 09/17/12 1035  . pentafluoroprop-tetrafluoroeth (GEBAUERS) aerosol 1 application  1 application Topical PRN Lauris Poag, MD      . simvastatin (ZOCOR) tablet 40 mg  40 mg Oral QPM Linward Headland, MD   40 mg at 09/17/12 1705  . sodium chloride 0.9 % injection 3 mL  3 mL Intravenous Q12H Na Li, MD   3 mL at 09/14/12 2122  . sodium chloride 0.9 % injection 3 mL  3 mL Intravenous PRN Na Li, MD   3 mL at 09/10/12 1137  . traMADol (ULTRAM) tablet 50 mg  50 mg Oral  Q12H PRN Dede Query, MD   50 mg at 09/12/12 2233     Vital Signs: Blood pressure 126/76, pulse 95, temperature 97.2 F (36.2 C), temperature source Oral, resp. rate 18, height 6\' 1"  (1.854 m), weight 347 lb 10.7 oz (157.7 kg), SpO2 98.00%.  Weight trends: Filed Weights   09/16/12 1245 09/16/12 1706 09/17/12 2157  Weight: 348 lb 5.2 oz (158 kg) 342 lb 13 oz (155.5 kg) 347 lb 10.7 oz (157.7 kg)    Physical Exam: General: Vital signs reviewed and noted. Well-developed, obese, in no acute distress;  appropriate and cooperative throughout examination.  Head: Normocephalic, atraumatic.  Lungs:  Ctab  Heart: RRR, no murmurs   Abdomen:  BS normoactive. Soft, obese, non-tender.    Extremities: B/l 1+ edema, no cyanosis, ACE wraps b/l legs   Neurologic: Alert and oriented x 3   Skin: hyperpigmentation and thickening to lower extremities    Lab results: Basic Metabolic Panel:  Recent Labs Lab 09/13/12 1200 09/14/12 0430 09/14/12 1457 09/15/12 0500 09/16/12 0905 09/17/12 0640 09/18/12 0450  NA 134* 136 135  136 134* 136 135  K 3.7 3.4* 3.7 3.7 3.7 4.0 4.1  CL 99 99 99 99 98 99 97  CO2 21 22 20 23 23 24 22   GLUCOSE 147* 123* 148* 150* 200* 173* 158*  BUN 73* 57* 59* 65* 48* 34* 44*  CREATININE 3.97* 3.51* 3.61* 4.04* 3.48* 2.96* 4.00*  CALCIUM 9.0 9.0 9.0 9.1 8.8 9.0 9.2  MG 2.1 2.0 2.0 1.9  --   --   --   PHOS 4.1  --   --  3.5 2.9 2.6 3.3    Liver Function Tests:  Recent Labs Lab 09/16/12 0905 09/17/12 0640 09/18/12 0450  ALBUMIN 3.0* 3.1* 3.2*    CBC:  Recent Labs Lab 09/14/12 0430 09/15/12 0500 09/16/12 0525 09/17/12 0640 09/18/12 0450  WBC 7.3 9.3 9.9 9.1 9.5  HGB 8.2* 8.2* 8.0* 7.7* 7.8*  HCT 26.0* 26.1* 25.8* 25.3* 25.4*  MCV 85.8 86.4 86.3 88.5 87.3  PLT 136* 131* 94* 73* 110*   CBG:  Recent Labs Lab 09/17/12 0741 09/17/12 1133 09/17/12 1648 09/17/12 2153 09/18/12 0720  GLUCAP 159* 163* 157* 153* 139*     Imaging: No results found.   Assessment & Plan: Pt is a 47 y.o. 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 He presented to the Internal Medicine Clinic with weight gain of 50 lbs during the last month (epic review shows weight 316-->350 lbs), increased shortness of breath, lower extremity edema and abdominal bloating.  He recently had a right AVF placed on 08/31/12.  1. Fluid overload secondary to end stage renal disease  -Renal following while inpatient for HD  -will get HD outpatient at [facility] -renal to dose Aranesp, Hectoral, Fe -trend daily weights and i/o's   2. 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 -cardiology consulted per cards  hold Dig with HD. Will need f/u outpatient   3. Possible Atrial fibrillation  -rates more controlled. Lopressor 50 mg tid.   -Cards following-please advise on anticoagulation or not   4. 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  primary following. On Argatroban -HIT panel pending  5. Diabetes 2 uncontrolled (HA1C 9.1 in 07/2012)  -hyperglycemia intermittently. primary team addressing  -Lantus 17 units qhs, SSI  6. Hypotension, improved  -Etiology could be related to anemia, medications, heart failure history.   -monitor BP still soft intermittently  7. Left great toe infection -Continue Abx x 7 days total per ortho   8. F/E/N -renal diet   9. DVT PPX  -should have scds due to thrombocytopenia and concern for HIT on Argatroban  Desma Maxim MD PGY 2 IM Discussed with attending

## 2012-09-18 NOTE — Progress Notes (Signed)
09/18/12  Pharmacy- Argatroban 0630  PTT = 136 sec  A/P:  47yo male with AFib with R/O HIT, receiving Argatroban.  PTT remain above goal of 50-90sec; up again.  Per d/w RN, drip was held and rate decreased as ordered.  No bleeding problems noted.  Pt for HD today.  Since drug is running through HD line, it will need to be stopped during HD.  1.  Hold Argatroban x , then resume at 0.20mcg/kg/min 2.  Check PTT 2hr after resumed.  Marisue Humble, PharmD Clinical Pharmacist [facility]

## 2012-09-18 NOTE — Progress Notes (Signed)
ANTICOAGULATION CONSULT NOTE - Follow-Up Consult  Pharmacy Consult for Argatroban > change to bivalirudin Indication: R/O HIT; afib  Allergies  Allergen Reactions  . Ace Inhibitors     Acute renal failure with multiple trials  . Heparin     HIT panel pending 8/22    Patient Measurements: Height: 6\' 1"  (185.4 cm) Weight: 344 lb 2.2 oz (156.1 kg) IBW/kg (Calculated) : 79.9   Vital Signs: Temp: 98.7 F (37.1 C) (08/23 1817) Temp src: Oral (08/23 1817) BP: 99/54 mmHg (08/23 1817) Pulse Rate: 85 (08/23 1817)  Labs:  Recent Labs  09/15/12 2057  09/16/12 0525 09/16/12 0530  09/16/12 0905 09/17/12 0640  09/18/12 0450 09/18/12 0900 09/18/12 1347 09/18/12 1800  HGB  --   < > 8.0*  --   --   --  7.7*  --  7.8*  --   --   --   HCT  --   --  25.8*  --   --   --  25.3*  --  25.4*  --   --   --   PLT  --   --  94*  --   --   --  73*  --  110*  --   --   --   APTT  --   --   --   --   --   --   --   < > 136* 122*  --  86*  LABPROT  --   --   --   --   --  16.5* 16.0*  --   --   --   --   --   INR  --   --   --   --   --  1.37 1.31  --   --   --   --   --   HEPARINUNFRC 0.12*  --   --  <0.10*  --   --  <0.10*  --   --   --   --   --   CREATININE  --   --   --   --   < > 3.48* 2.96*  --  4.00*  --  4.26*  --   TROPONINI  --   --   --   --   --   --   --   --   --   --  <0.30  --   < > = values in this interval not displayed.  Estimated Creatinine Clearance: 33.1 ml/min (by C-G formula based on Cr of 4.26).  Assessment: 47 yo male with complicated PMH including ESRD, new - onset afib and heart failure. Was on IV heparin and platelets decreased >50%-Pharmacy asked to begin argatroban.  8.23 patient experienced VT episodes, QT noted to be prolonged.  Pharmacy consulted asked to switch to bivalirudin in light of the rare incidence of argatroban causing VT and the patients notably elevated aPTT despite multiple aggressive rate decreases.  Note IV Argatroban was infusing through HD  catheter.  With labs from opposite arm.  Goal of Therapy:  aPTT 50-90 seconds Monitor platelets by anticoagulation protocol: Yes   Plan:  1. APTT remains elevated. Will recheck in 2h and start bivalirudin 2. Monitor for any bleeding complications. 3. F/u HIT panel results (panel drawn 8/22)   Thank you for allowing pharmacy to be a part of this patients care team.  Lovenia Kim Pharm.D., BCPS Clinical Pharmacist 09/18/2012 7:35 PM Pager: [phone] Phone: [phone] ' 

## 2012-09-18 NOTE — Progress Notes (Signed)
09/19/12  Pharmacy-  Bivalirudin 0005  APTT 83  A/P:  47 yo male with AFib, ESRD and episode of VT on Argatroban today & stopped with plan to start Bivalirudin once PTT falls.  Minimal change in PTT in last 4hr.  No bleeding noted.  -  F/U AM PTT  Marisue Humble, PharmD Clinical Pharmacist [place] [facility]

## 2012-09-18 NOTE — Progress Notes (Addendum)
Internal Medicine On-Call Attending  Date: 09/18/2012  Patient name: Adam [name] Medical record number: [id number] Date of birth: 11-01-1965 Age: 47 y.o. Gender: male  I saw and evaluated the patient and discussed his care with resident Dr. Dierdre Searles; see her note for details of clinical findings and plans.  I also discussed with Dr. Roanna Raider (hematology consultant) after he saw patient; he agrees with Dr. Harvie Bridge decision to stop Argatroban and switch to Angiomax (bivalirudin) per pharmacy protocol.  See his note for details of his assessment and full recommendations.

## 2012-09-18 NOTE — Progress Notes (Signed)
Pt had a run of NS VT.  Was not long enough for his ICD to fire.  In researching the complications of Argatroban, it appears to rarely cause VT and cardiac arrest.   Discussed the case with Dr. Lovenia Kim, Pharm. D. She would recommend stopping the argatroban and measuring the ptt in 4 hours.  If it has normalized, we will start bivalirudin.    Agree with Dr. Dierdre Searles to more patient to step down for now.  Check electrolytes.  Adam [name], Adam Hageman., MD, [facility] 09/18/2012, 2:12 PM Office - [phone] Pager [phone]

## 2012-09-18 NOTE — Consult Note (Signed)
Adam [name]  [phone] [phone]     INITIAL HEMATOLOGY CONSULTATION    Referral MD:    Reason for Referral: Thrombocytopenia    HPI: Patient is a 47 y.o. male who was admitted to [name] on 09/09/2012 for evaluation of fluid overload. He presented to the Internal Medicine Clinic with weight gain of 50 lbs during the last month, dyspnea, lower extremities edema not responsive to recent increase in diuretics.  He  had a right radial cephalic  AV fistula  placed on 08/31/12. Adam [name] was started on HD. He was found to have left foot cellulitis and pressure wound in the setting of DM an was started on antibiotics. Patient has had several episodes of tachycardia that  terminated abruptly Some appears to be atrial flutter, some probably atrial fibrillation. He has not had this captured in the past.  He was seen by cardiologist who recommended anticoagulating with heparin IV which was started on 09/13/12. He was on subq heparin DVT prophylaxis from admission and probably heparin  on HD. 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 HIPA was send And argatroban was started on 09/17/12. The patient had episodes of VT and argatroban was stopped. Cardiologist decided to start Angiomax  and have hematology consult..   Review of Systems  Constitutional: Negative for fever, chills, weight loss, malaise/fatigue and diaphoresis.  HENT: Positive for hearing loss. Negative for ear pain, nosebleeds, congestion, sore throat, neck pain, tinnitus and ear discharge.   Eyes: Negative for blurred vision, double vision, photophobia and pain.  Respiratory: Positive for cough and shortness of breath. Negative for hemoptysis, sputum production and wheezing.   Cardiovascular: Positive for palpitations and leg swelling. Negative for chest pain and orthopnea.  Gastrointestinal: Positive for vomiting and blood in stool. Negative for heartburn, nausea, abdominal pain, diarrhea, constipation and melena.    Genitourinary: Negative for dysuria, urgency, frequency and hematuria.  Musculoskeletal: Negative for myalgias and back pain.  Skin: Negative for itching and rash.  Neurological: Negative for dizziness, tingling, focal weakness, seizures, loss of consciousness, weakness and headaches.  Psychiatric/Behavioral: Negative.      Past Medical History  Diagnosis Date  . Other and unspecified hyperlipidemia   . Insomnia, unspecified   . Obstructive sleep apnea (adult) (pediatric)   . Allergic rhinitis, cause unspecified   . Nonischemic cardiomyopathy   . Unspecified essential hypertension   . Type II or unspecified type diabetes mellitus without mention of complication, not stated as uncontrolled   . CKD (chronic kidney disease) stage 3, GFR 30-59 ml/min   . Esophageal reflux   . Morbid obesity   . Dysuria   . Tobacco use disorder   . Dysrhythmia   . Pacemaker   . Antral ulcer   . Renal azotemia   . Arthritis     Gout w/hyperuricemia  . Morbid obesity   :    Past Surgical History  Procedure Laterality Date  . Cardiac defibrillator placement  2011  . Esophagogastroduodenoscopy  01/03/2011    Procedure: ESOPHAGOGASTRODUODENOSCOPY (EGD);  Surgeon: Vertell Novak., MD;  Location: [facility] ENDOSCOPY;  Service: Endoscopy;  Laterality: N/A;  . Av fistula placement Right 08/31/2012    Procedure: ARTERIOVENOUS (AV) FISTULA CREATION;  Surgeon: Chuck Hint, MD;  Location: [name] OR;  Service: Vascular;  Laterality: Right;  . Insertion of dialysis catheter Right 08/31/2012    Procedure: INSERTION OF DIALYSIS CATHETER-Right Internal Jugular Placement;  Surgeon: Chuck Hint, MD;  Location: [name] OR;  Service: Vascular;  Laterality:  Right;  :   CURRENT MEDS: Current Facility-Administered Medications  Medication Dose Route Frequency Provider Last Rate Last Dose  . 0.9 %  sodium chloride infusion  100 mL Intravenous PRN Lauris Poag, MD      . 0.9 %  sodium chloride infusion  100 mL  Intravenous PRN Lauris Poag, MD      . 0.9 %  sodium chloride infusion  250 mL Intravenous PRN Na Dierdre Searles, MD      . acetaminophen (TYLENOL) tablet 650 mg  650 mg Oral Q6H PRN Jonah Blue, DO   650 mg at 09/17/12 1518  . cephALEXin (KEFLEX) capsule 250 mg  250 mg Oral Q2200 Marjan Rabbani, MD   250 mg at 09/17/12 2217  . diclofenac sodium (VOLTAREN) 1 % transdermal gel 2 g  2 g Topical QID PRN Jonah Blue, DO      . docusate sodium (COLACE) capsule 100 mg  100 mg Oral BID PRN Jonah Blue, DO      . doxercalciferol (HECTOROL) injection 2 mcg  2 mcg Intravenous Q T,Th,Sa-HD Irena Cords, MD   2 mcg at 09/18/12 1500  . febuxostat (ULORIC) tablet 80 mg  80 mg Oral Daily Jonah Blue, DO   80 mg at 09/17/12 1035  . ferric gluconate (NULECIT) 125 mg in sodium chloride 0.9 % 100 mL IVPB  125 mg Intravenous Q T,Th,Sa-HD Irena Cords, MD   125 mg at 09/18/12 1630  . insulin aspart (novoLOG) injection 0-9 Units  0-9 Units Subcutaneous TID WC Linward Headland, MD   2 Units at 09/18/12 1223  . insulin glargine (LANTUS) injection 17 Units  17 Units Subcutaneous QHS Otis Brace, MD   17 Units at 09/17/12 2217  . lidocaine (PF) (XYLOCAINE) 1 % injection 5 mL  5 mL Intradermal PRN Lauris Poag, MD      . lidocaine-prilocaine (EMLA) cream 1 application  1 application Topical PRN Lauris Poag, MD      . LORazepam (ATIVAN) tablet 0.5 mg  0.5 mg Oral BID PRN Jonah Blue, DO   0.5 mg at 09/11/12 1334  . metoprolol (LOPRESSOR) tablet 50 mg  50 mg Oral TID Pricilla Riffle, MD   50 mg at 09/17/12 2217  . pantoprazole (PROTONIX) EC tablet 40 mg  40 mg Oral Daily Linward Headland, MD   40 mg at 09/17/12 1035  . pentafluoroprop-tetrafluoroeth (GEBAUERS) aerosol 1 application  1 application Topical PRN Lauris Poag, MD      . simvastatin (ZOCOR) tablet 40 mg  40 mg Oral QPM Linward Headland, MD   40 mg at 09/17/12 1705  . sodium chloride 0.9 % injection 3 mL  3 mL Intravenous Q12H Na Li, MD   3 mL at  09/14/12 2122  . sodium chloride 0.9 % injection 3 mL  3 mL Intravenous PRN Na Li, MD   3 mL at 09/10/12 1137      Allergies  Allergen Reactions  . Ace Inhibitors     Acute renal failure with multiple trials  . Heparin     HIT panel pending 8/22  :  Family History  Problem Relation Age of Onset  . Diabetes Mother   . Microcephaly Father   . Lung cancer Father   :  History   Social History  . Marital Status: Single    Spouse Name: N/A    Number of Children: N/A  . Years of Education: N/A   Occupational  History  . Not on file.   Social History Main Topics  . Smoking status: Current Every Day Smoker -- 0.50 packs/day for .5 years    Types: Cigarettes  . Smokeless tobacco: Never Used     Comment: started smoking at age 57. Will stop on his own - cutting back now- was smoking a ppd - now only 3 a day  . Alcohol Use: No     Comment: No alcohol x 1 month.  . Drug Use: No  . Sexual Activity: Not on file   Other Topics Concern  . Not on file   Social History Narrative  . No narrative on file  :  REVIEW OF SYSTEM:  The rest of the 14-point review of sytem was negative.   Exam: Patient Vitals for the past 24 hrs:  BP Temp Temp src Pulse Resp SpO2 Height Weight  09/18/12 1700 97/54 mmHg - - 79 21 94 % - -  09/18/12 1645 101/62 mmHg - - - - - - -  09/18/12 1630 98/61 mmHg - - 109 12 100 % - -  09/18/12 1600 95/65 mmHg - - 81 17 99 % - -  09/18/12 1530 92/55 mmHg - - 77 - 98 % - -  09/18/12 1500 92/58 mmHg - - 75 15 88 % - -  09/18/12 1430 99/55 mmHg - - 114 23 100 % - -  09/18/12 1408 96/53 mmHg - - 110 24 100 % - -  09/18/12 1402 96/51 mmHg 98.9 F (37.2 C) Oral 103 28 90 % - 350 lb 12 oz (159.1 kg)  09/18/12 1243 97/59 mmHg 99.8 F (37.7 C) - 111 19 99 % - -  09/18/12 0857 126/76 mmHg 97.2 F (36.2 C) - 95 18 98 % - -  09/18/12 0413 104/72 mmHg 99 F (37.2 C) Oral 96 20 100 % - -  09/17/12 2157 101/68 mmHg 99 F (37.2 C) Oral 70 20 96 % 6\' 1"  (1.854 m) 347  lb 10.7 oz (157.7 kg)  09/17/12 1744 110/75 mmHg 98.5 F (36.9 C) Oral 79 18 93 % - -    General:  well-nourished in no acute distress.  Eyes:  no scleral icterus.  ENT:  There were no oropharyngeal lesions.  Neck was without thyromegaly.  Lymphatics:  Negative cervical, supraclavicular or axillary adenopathy.  Respiratory: lungs were clear bilaterally without wheezing or crackles.  Cardiovascular:  Regular rate and rhythm, S1/S2, without murmur, rub or gallop.  There was no pedal edema.  GI:  abdomen was soft, flat, nontender, nondistended, without organomegaly.  Muscoloskeletal:  no spinal tenderness of palpation of vertebral spine.  Skin exam was without ecchymosis, petechiae.  Neuro exam was non focal.  Patient was able to get on and off exam table without assistance.  Gait was normal.  Patient was alerted and oriented.  Attention was good.   Language was appropriate.  Mood was normal without depression.  Speech was not pressured.  Thought content was not tangential.    LABS:  Lab Results  Component Value Date   WBC 9.5 09/18/2012   HGB 7.8* 09/18/2012   HCT 25.4* 09/18/2012   PLT 110* 09/18/2012   GLUCOSE 178* 09/18/2012   CHOL 60 04/15/2012   TRIG 49 04/15/2012   HDL 26* 04/15/2012   LDLCALC 24 04/15/2012   ALT <5 09/18/2012   AST 13 09/18/2012   NA 134* 09/18/2012   K 3.9 09/18/2012   CL 97 09/18/2012   CREATININE 4.26*  09/18/2012   BUN 46* 09/18/2012   CO2 24 09/18/2012   INR 1.31 09/17/2012   HGBA1C 9.1 07/27/2012   MICROALBUR 19.31* 02/10/2012    Dg Chest 2 View  08/26/2012   *RADIOLOGY REPORT*  Clinical Data: Preop for dialysis catheter insertion  CHEST - 2 VIEW  Comparison: 10/28/2011  Findings: Cardiomegaly again noted.  Single lead cardiac pacemaker is unchanged in position.  No acute infiltrate or pleural effusion. No pulmonary edema.  Bony thorax is stable.  IMPRESSION:  No active disease.  Cardiomegaly again noted.  Single lead cardiac pacemaker in place.   Original Report Authenticated  By: Natasha Mead, M.D.   Dg Chest Port 1 View  09/10/2012   *RADIOLOGY REPORT*  Clinical Data: Lower extremity swelling.  Weakness and dizziness.  PORTABLE CHEST - 1 VIEW  Comparison: 08/31/2012.  Findings: The pacer wire / AICD is stable.  The dialysis catheter is unchanged.  The heart is enlarged and there is vascular congestion and pulmonary edema.  No definite pleural effusion.  IMPRESSION: Cardiac enlargement and pulmonary edema suggesting CHF.  No definite pleural effusion.   Original Report Authenticated By: Rudie Meyer, M.D.   Dg Chest Port 1 View  08/31/2012   *RADIOLOGY REPORT*  Clinical Data: Status post hemodialysis catheter placement  PORTABLE CHEST - 1 VIEW  Comparison: Prior chest x-ray 08/26/2012  Findings: Interval placement of a right IJ approach tunneled hemodialysis catheter.  The catheter tips project over the superior cavoatrial junction.  No evidence of pneumothorax.  Enlargement the cardiopericardial silhouette with pulmonary vascular congestion and mild interstitial edema.  Stable left subclavian approach cardiac rhythm maintenance device with the single lead projecting over the right ventricle.  No acute osseous abnormality.  IMPRESSION:  1.  Right IJ approach tunneled hemodialysis catheter with the tips projecting over the superior cavoatrial junction. 2.  Cardiomegaly with mild CHF versus volume overload.   Original Report Authenticated By: Malachy Moan, M.D.   Dg Foot Complete Left  09/12/2012   *RADIOLOGY REPORT*  Clinical Data: Left toe infection, left dorsal foot ulcer for 1 month, rule out osteomyelitis left big toe  LEFT FOOT - COMPLETE 3+ VIEW  Comparison: None.  Findings: Extensive small vessel calcification.  No fracture dislocation.  No periosteal reaction or cortical destruction.  IMPRESSION: No evidence for osteomyelitis.   Original Report Authenticated By: Esperanza Heir, M.D.   Dg Fluoro Guide Cv Line-no Report  08/31/2012   CLINICAL DATA: Dialysis Catheter  Placement   FLOURO GUIDE CV LINE  Fluoroscopy was utilized by the requesting physician.  No radiographic  interpretation.     Blood smear review:    ASSESSMENT AND PLAN: 1. Thrombocytopenia. HIPA is pending. 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 Argatroban can be dangerous in patient with arrhythmias because of many cardiac side effects ( cardiac arrest (6%), ventricular tachycardia (5%), bradycardia (5%), myocardial infarction (PCI: 4%), atrial fibrillation (3%), angina (2%)). I agree with Angiomax. Please use protocol from pharmacy for HD patients. Keep aPTT 1.5-2.5 times baseline value 2,  Dyspnea. Would consider Angio Chest CT per nephrology recommendation. If not possible consider V/Q scan. 3.  Legs swelling. Would consider lower extremities venous duplex. 4. Anemia. ACD with or without IDA. Please check Ferritin. Agree with ferric gluconate. I would recommend to check serum folate and vitamin B12 level. 5. NO HEPARIN, please.   The patient was discussed with Dr. Yetta Barre. Dr. Melburn Popper was paged but did not call back yet.    Thank you for  this referral.

## 2012-09-18 NOTE — Progress Notes (Signed)
Resp Care Note;Pt refusing CPAP tonight states our large mask is to small.Informed pt the he could use his mask from home.Family to hopefully bring it in tomorrow.

## 2012-09-18 NOTE — Progress Notes (Signed)
09/18/12  Pharmacy-  Argatroban 0020  PTT = 128 sec  A/P:  47yo male with afib, on Argatroban for R/O HIT.  PTT above goal of 50-90sec on current rate.  Per d/w RN, drug is infusing at correct rate and drawn appropriately.  1.  Hold Argatroban for 30 min, then resume at 0.52mcg/kg/min 2.  Check PTT 2hr after resumed.  Marisue Humble, PharmD Clinical Pharmacist [facility]

## 2012-09-18 NOTE — Progress Notes (Signed)
S: patient was eating lunch and started to have some palpitation. Denies feeling dizziness, lightheadedness, syncope, chest pain, chest pressure.   O: BP 97/59, HR 111, T 97.2, RR 19 ( previous BP was 126/76)     General :NAD      Lungs : CTZ B/L      Heart: RRR. No M/G/R      Abd: soft, BS x 4  A/P - rhythm strip reviewed >>several NS ventricular arrythmia ? V tacy with HR 140-150's. ICD did not fire.  - stat EKG>> no ischemic changes.      QTc  08/26/12 470  09/13/12 499  09/14/12 576  09/18/12 527          - Pharmacy called and medications reviewed. Only two medications Tramadol and Zofran could cause prolonged QTc, but patient never received these medications during this admission.  -  Tramadol and Zofran D/C'd - Argatroban reviewed--side effects include MI, Ventricular tachycardia and cardiac arrest>>> discussed with Dr. Melburn Popper. Please see his note.  - discussed with the IMTS attending Dr. Meredith Pel - will call for hematology consult today. - will not stop his HD today - patient is transferred to 2900 for close monitoring.   Dede Query, MD PGY-3 IMTS 4:36 PM

## 2012-09-18 NOTE — Progress Notes (Signed)
Subjective: Patient states that he feels fine. No chest pain, chest pressure or SOB. HD today.  He states that he is upset now since someone told him today that he had "bad heart and kidney" . He states that he is depressed since this morning. But Denies SI/HI.   Objective: Vital signs in last 24 hours: Filed Vitals:   09/18/12 1430 09/18/12 1500 09/18/12 1530 09/18/12 1600  BP: 99/55 92/58 92/55  95/65  Pulse: 114 75 77 81  Temp:      TempSrc:      Resp: 23 15  17   Height:      Weight:      SpO2: 100% 88% 98% 99%   Weight change: -10.6 oz (-0.3 kg)  Intake/Output Summary (Last 24 hours) at 09/18/12 1612 Last data filed at 09/18/12 1244  Gross per 24 hour  Intake 1029.94 ml  Output      0 ml  Net 1029.94 ml   Constitutional: Obese, NAD  Head: Normocephalic  Eyes: PERRL  Neck: Supple  Cardiovascular: RRR S1 normal, S2 normal, pitting +2 edema b/l LE Pulmonary/Chest: Air entry equal bilaterally. Scatterd ronch wheezing  Abdominal: Soft. Non-tender, non-distended, bowel sounds are normal, no masses, organomegaly, or guarding present.  Musculoskeletal: No synovitis, effusions, or erythema of bilateral upper and lower  appendicular joints  Neurological: A&O x3  Skin: Chronic venous stasis changes of bilateral lower legs, w/ oozing lesion on right leg, left foot wrapped in gauze   Lab Results: Basic Metabolic Panel:  Recent Labs Lab 09/15/12 0500  09/17/12 0640 09/18/12 0450 09/18/12 1347  Cutter [place] 136  < > 136 135 134*  K 3.7  < > 4.0 4.1 3.9  CL 99  < > 99 97 97  CO2 23  < > 24 22 24   GLUCOSE 150*  < > 173* 158* 178*  BUN 65*  < > 34* 44* 46*  CREATININE 4.04*  < > 2.96* 4.00* 4.26*  CALCIUM 9.1  < > 9.0 9.2 9.0  MG 1.9  --   --   --  2.0  PHOS 3.5  < > 2.6 3.3  --   < > = values in this interval not displayed. Liver Function Tests:  Recent Labs Lab 09/18/12 0450 09/18/12 1347  AST  --  13  ALT  --  <5  ALKPHOS  --  119*  BILITOT  --  3.2*  PROT  --  7.5    ALBUMIN 3.2* 3.0*   CBC:  Recent Labs Lab 09/17/12 0640 09/18/12 0450  WBC 9.1 9.5  HGB 7.7* 7.8*  HCT 25.3* 25.4*  MCV 88.5 87.3  PLT 73* 110*   Cardiac Enzymes:  Recent Labs Lab 09/13/12 2030 09/14/12 2230 09/18/12 1347  TROPONINI <0.30 <0.30 <0.30   CBG:  Recent Labs Lab 09/17/12 0741 09/17/12 1133 09/17/12 1648 09/17/12 2153 09/18/12 0720 09/18/12 1127  GLUCAP 159* 163* 157* 153* 139* 176*   Coagulation:  Recent Labs Lab 09/16/12 0905 09/17/12 0640  LABPROT 16.5* 16.0*  INR 1.37 1.31   Anemia Panel:  Recent Labs Lab 09/13/12 1300  TIBC 406  IRON 19*   Urine Drug Screen: Drugs of Abuse     Component Value Date/Time   LABOPIA NEG 03/17/2006 1455   COCAINSCRNUR NEG 03/17/2006 1455   LABBENZ NEG 03/17/2006 1455   AMPHETMU NEG 03/17/2006 1455  Medications: I have reviewed the patient's current medications. Scheduled Meds: . cephALEXin  250 mg Oral Q2200  . doxercalciferol  2  mcg Intravenous Q T,Th,Sa-HD  . febuxostat  80 mg Oral Daily  . ferric gluconate (FERRLECIT/NULECIT) IV  125 mg Intravenous Q T,Th,Sa-HD  . insulin aspart  0-9 Units Subcutaneous TID WC  . insulin glargine  17 Units Subcutaneous QHS  . metoprolol tartrate  50 mg Oral TID  . pantoprazole  40 mg Oral Daily  . simvastatin  40 mg Oral QPM  . 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 - weight down 13 lbs since admission  # Atrial Fibrallation -  3 episodes of Vtach 8/18,  1 episode on 8/19 and 8/20   -appreciate cardiology input!! 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 - wound consultation - Left forefoot: 1.5cm x 2cm x 0.2cm moist, pale pink ulcer surrounded by three pinpoint ulcerations (<0.2cm round); total area of involvement measures 4cm x 3cm. Left great toe with what appears to be an infected paronychia, recommend I & D of paronychia and antibiotics  -Obtain Xray left foot - no osteomyelitis  -Obtain ABI - R 1.41 L 1.39 - suggesting calcified vessels, waveforms of b/l LE WNL  -Ortho consult - Per Dr. Eulah Pont no surgical debridement at this time, continue wound care, antibiotics -oral keflex 250mg  BID for 5 days, day 3 today  - Per Dr. Lajoyce Corners - wound care nurses to apply a Profore wrap 4 both lower extremities. To be d/c with compression wraps and f/u in office for wound care .  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  # Somnolence - resolved.  #DM c/b dyslipidemia - last HbA1c 9.1 on 07/27/2012  -CBG WNL today  - Lantus 15 U q pm increase to 17U due to elevated CBG  - SSI for meals  - Continue home statin  - Lipid panel 03/2012 - low HDL (26)   #OSA  - CPAP at night   # Anxiety  - Ativan 0.5 mg prn   #Knee Pain  - Tylenold prn for mild pain  - Tramadol, Oxycodone AS NEEDED  due to somnolence and hypotension  - Voltaren Gel   # Gout  - Febuxostat  #GERD  - Continue home PPI  # Prophylaxis  argatroban       Dispo: Disposition is deferred at this time, awaiting improvement of current medical problems.  Anticipated discharge in approximately 2-3 day(s).   The patient does have a current PCP Lars Masson, MD) and does need an [facility] hospital follow-up appointment after discharge.  The patient does not have transportation limitations that hinder transportation to clinic appointments.  .Services Needed at time of discharge: Y = Yes, Blank = No PT:   OT:   RN:   Equipment:   Other:     LOS: 9 days   Dede Query, MD 09/18/2012, 4:12 PM

## 2012-09-19 DIAGNOSIS — D7582 Heparin induced thrombocytopenia (HIT): Secondary | ICD-10-CM

## 2012-09-19 DIAGNOSIS — R609 Edema, unspecified: Secondary | ICD-10-CM

## 2012-09-19 LAB — CBC
Hemoglobin: 8.6 g/dL — ABNORMAL LOW (ref 13.0–17.0)
Platelets: 140 10*3/uL — ABNORMAL LOW (ref 150–400)
RBC: 3.18 MIL/uL — ABNORMAL LOW (ref 4.22–5.81)
WBC: 9.5 10*3/uL (ref 4.0–10.5)

## 2012-09-19 LAB — MAGNESIUM: Magnesium: 1.9 mg/dL (ref 1.5–2.5)

## 2012-09-19 LAB — FOLATE: Folate: 12.8 ng/mL

## 2012-09-19 LAB — RENAL FUNCTION PANEL
Albumin: 3.2 g/dL — ABNORMAL LOW (ref 3.5–5.2)
Calcium: 9 mg/dL (ref 8.4–10.5)
GFR calc Af Amer: 29 mL/min — ABNORMAL LOW (ref >90)
GFR calc non Af Amer: 25 mL/min — ABNORMAL LOW (ref >90)
Phosphorus: 2.7 mg/dL (ref 2.3–4.6)
Potassium: 4.2 mEq/L (ref 3.5–5.1)
Sodium: 136 mEq/L (ref 135–145)

## 2012-09-19 LAB — TROPONIN I: Troponin I: <0.3 ng/mL (ref <0.30)

## 2012-09-19 LAB — GLUCOSE, CAPILLARY
Glucose-Capillary: 127 mg/dL — ABNORMAL HIGH (ref 70–99)
Glucose-Capillary: 166 mg/dL — ABNORMAL HIGH (ref 70–99)

## 2012-09-19 LAB — APTT: aPTT: 77 seconds — ABNORMAL HIGH (ref 24–37)

## 2012-09-19 LAB — VITAMIN B12: Vitamin B-12: 728 pg/mL (ref 211–911)

## 2012-09-19 MED ORDER — SODIUM CHLORIDE 0.9 % IV SOLN
0.0250 mg/kg/h | INTRAVENOUS | Status: DC
  Administered 2012-09-19 – 2012-09-22 (×2): 0.025 mg/kg/h via INTRAVENOUS
  Filled 2012-09-19 (×2): qty 250

## 2012-09-19 MED ORDER — SODIUM CHLORIDE 0.9 % IV SOLN
INTRAVENOUS | Status: DC
  Administered 2012-09-19: 11:00:00 via INTRAVENOUS

## 2012-09-19 NOTE — Progress Notes (Signed)
Internal Medicine On-Call Attending  Date: 09/19/2012  Patient name: Adam [name] Medical record number: [id number] Date of birth: 15-Jan-1966 Age: 47 y.o. Gender: male  I saw and evaluated the patient, and discussed his care with house staff; see the note by resident Dr. Johna Roles for details of clinical findings and plans.  Platelet count is improving, patient is now on bivalirudin.  Dr. Criselda Peaches will take over as attending physician tomorrow 09/20/2012.

## 2012-09-19 NOTE — Progress Notes (Signed)
ANTICOAGULATION CONSULT NOTE - Follow-Up Consult  Pharmacy Consult for Argatroban > change to bivalirudin Indication: R/O HIT; afib  Allergies  Allergen Reactions  . Ace Inhibitors     Acute renal failure with multiple trials  . Heparin     HIT panel pending 8/22    Patient Measurements: Height: 6\' 1"  (185.4 cm) Weight: 339 lb 1.1 oz (153.8 kg) IBW/kg (Calculated) : 79.9   Vital Signs: Temp: 98.4 F (36.9 C) (08/24 0727) Temp src: Oral (08/24 0727) BP: 94/59 mmHg (08/24 0727) Pulse Rate: 70 (08/24 0727)  Labs:  Recent Labs  09/17/12 0640  09/18/12 0450  09/18/12 1347 09/18/12 1800 09/18/12 1909 09/18/12 2220 09/19/12 0200 09/19/12 0714  HGB 7.7*  --  7.8*  --   --   --   --  7.7* 8.6*  --   HCT 25.3*  --  25.4*  --   --   --   --  25.1* 28.4*  --   PLT 73*  --  110*  --   --   --   --  112* 140*  --   APTT  --   < > 136*  < >  --  86*  --  83*  --  76*  LABPROT 16.0*  --   --   --   --   --   --   --   --   --   INR 1.31  --   --   --   --   --   --   --   --   --   HEPARINUNFRC <0.10*  --   --   --   --   --   --   --   --   --   CREATININE 2.96*  --  4.00*  --  4.26*  --   --   --  2.84*  --   TROPONINI  --   --   --   --  <0.30  --  <0.30  --  <0.30  --   < > = values in this interval not displayed.  Estimated Creatinine Clearance: 49.3 ml/min (by C-G formula based on Cr of 2.84).  Assessment: 47 yo male with complicated PMH including ESRD, new - onset afib and heart failure. Was on IV heparin and platelets decreased >50%-Pharmacy asked to begin argatroban.  8.23 patient experienced VT episodes, QT noted to be prolonged.  Pharmacy consulted asked to switch to bivalirudin in light of the rare incidence of argatroban causing VT and the patients notably elevated aPTT despite multiple aggressive rate decreases.   APTT today= 76 and patient to begin bivalirudin.  Patient now with peripheral IV access.  Platelet increased to 140 today and the HIT panel is still  pending. Due to increased sensitivity on argatroban will begin bivalirudin at a lower rate. Noted plans for coumadin.  Goal of Therapy:  aPTT 50-90 seconds Monitor platelets by anticoagulation protocol: Yes   Plan:  -Begin bivalirudin at 0.025 mcg/lg/kr -aPTT in 2 hours and daily -Could consider starting coumadin once platelets >150K -F/u HIT panel results (panel drawn 8/22) -PT/INR in am to determine a baseline INR  Harland German, Pharm D 09/19/2012 9:41 AM

## 2012-09-19 NOTE — Progress Notes (Signed)
VASCULAR LAB PRELIMINARY  PRELIMINARY  PRELIMINARY  PRELIMINARY  Bilateral lower extremity venous Dopplers completed.    Preliminary report:  There is no DVT or SVT noted in the bilateral lower extremities.  Arna Luis, RVT 09/19/2012, 10:38 AM

## 2012-09-19 NOTE — Progress Notes (Signed)
I have seen and examined this patient and agree with plan as outlined by Dr. Shirlee Latch.  Pt tolerated HD well yesterday despite episode of VT.  Now on angiomax per Cards and Heme.  Cont with HD qTTS, however he may benefit from 4 HD session/wk if volume remains an issue.  His legs have significantly improved with wraps.  EDW ~154kg.  His SOB is likely due to anemia and CMP.  He has had negative duplex of his legs and has been on anticoagulation for days, so don't feel CT angio of lungs would be warranted, as PE is unlikely. Adam [name] A,MD 09/19/2012 11:04 AM

## 2012-09-19 NOTE — Progress Notes (Signed)
Subjective:  Pt seen and examined. Pt feeling depressed regarding news he received yesterday, no SI. Patient denies CP, palpitations, dyspnea at rest, or cough. He reports dyspnea when gets up and exerts himself. No extremity pain.     Objective: Vital signs in last 24 hours: Filed Vitals:   09/19/12 0337 09/19/12 0500 09/19/12 0700 09/19/12 0727  BP: 96/51   94/59  Pulse:    70  Temp: 98.1 F (36.7 C)   98.4 F (36.9 C)  TempSrc: Oral   Oral  Resp: 16   20  Height:      Weight:  153.8 kg (339 lb 1.1 oz) 153.8 kg (339 lb 1.1 oz)   SpO2: 100%   98%   Weight change: 1.4 kg (3 lb 1.4 oz)  Intake/Output Summary (Last 24 hours) at 09/19/12 0801 Last data filed at 09/18/12 1817  Gross per 24 hour  Intake    200 ml  Output   2721 ml  Net  -2521 ml   Constitutional: Obese, NAD Head: Normocephalic Eyes: PERRL Neck: Supple Cardiovascular:  RRR, S1 normal, S2 normal, pitting +2 edema b/l from calves to abdomen.  Pulmonary/Chest: Air entry equal bilaterally. Scatterd ronchi and  wheezing Abdominal: Soft. Non-tender, non-distended, bowel sounds are normal, no masses, organomegaly, or guarding present.  Musculoskeletal: No synovitis, effusions, or erythema of bilateral upper and lower  appendicular joints  Neurological: A&O x3 Skin: both LE wrapped    Psych: depressed mood    Lab Results: Basic Metabolic Panel:  Recent Labs Lab 09/18/12 0450 09/18/12 1347 09/19/12 0200  NA 135 134* 136  K 4.1 3.9 4.2  CL 97 97 97  CO2 22 24 24   GLUCOSE 158* 178* 141*  BUN 44* 46* 26*  CREATININE 4.00* 4.26* 2.84*  CALCIUM 9.2 9.0 9.0  MG  --  2.0 1.9  PHOS 3.3  --  2.7   Liver Function Tests:  Recent Labs Lab 09/18/12 1347 09/19/12 0200  AST 13  --   ALT <5  --   ALKPHOS 119*  --   BILITOT 3.2*  --   PROT 7.5  --   ALBUMIN 3.0* 3.2*   No results found for this basename: LIPASE, AMYLASE,  in the last 168 hours No results found for this basename: AMMONIA,  in the last  168 hours CBC:  Recent Labs Lab 09/18/12 2220 09/19/12 0200  WBC 8.4 9.5  HGB 7.7* 8.6*  HCT 25.1* 28.4*  MCV 87.5 89.3  PLT 112* 140*   Cardiac Enzymes:  Recent Labs Lab 09/18/12 1347 09/18/12 1909 09/19/12 0200  TROPONINI <0.30 <0.30 <0.30   BNP: No results found for this basename: PROBNP,  in the last 168 hours D-Dimer: No results found for this basename: DDIMER,  in the last 168 hours CBG:  Recent Labs Lab 09/17/12 1133 09/17/12 1648 09/17/12 2153 09/18/12 0720 09/18/12 1127 09/18/12 2159  GLUCAP 163* 157* 153* 139* 176* 148*   Hemoglobin A1C: No results found for this basename: HGBA1C,  in the last 168 hours Fasting Lipid Panel: No results found for this basename: CHOL, HDL, LDLCALC, TRIG, CHOLHDL, LDLDIRECT,  in the last 168 hours Thyroid Function Tests: No results found for this basename: TSH, T4TOTAL, FREET4, T3FREE, THYROIDAB,  in the last 168 hours Coagulation:  Recent Labs Lab 09/16/12 0905 09/17/12 0640  LABPROT 16.5* 16.0*  INR 1.37 1.31   Anemia Panel:  Recent Labs Lab 09/13/12 1300 09/18/12 2220  TIBC 406  --  IRON 19*  --   RETICCTPCT  --  3.2*   Urine Drug Screen: Drugs of Abuse     Component Value Date/Time   LABOPIA NEG 03/17/2006 1455   COCAINSCRNUR NEG 03/17/2006 1455   LABBENZ NEG 03/17/2006 1455   AMPHETMU NEG 03/17/2006 1455    Alcohol Level: No results found for this basename: ETH,  in the last 168 hours Urinalysis: No results found for this basename: COLORURINE, APPERANCEUR, LABSPEC, PHURINE, GLUCOSEU, HGBUR, BILIRUBINUR, KETONESUR, PROTEINUR, UROBILINOGEN, NITRITE, LEUKOCYTESUR,  in the last 168 hours Misc. Labs:   Micro Results: Recent Results (from the past 240 hour(s))  MRSA PCR SCREENING     Status: None   Collection Time    09/18/12  7:47 PM      Result Value Range Status   MRSA by PCR NEGATIVE  NEGATIVE Final   Comment:            The GeneXpert MRSA Assay (FDA     approved for NASAL specimens      only), is one component of a     comprehensive MRSA colonization     surveillance program. It is not     intended to diagnose MRSA     infection nor to guide or     monitor treatment for     MRSA infections.   Studies/Results: No results found. Medications: I have reviewed the patient's current medications. Scheduled Meds: . cephALEXin  250 mg Oral Q2200  . doxercalciferol  2 mcg Intravenous Q T,Th,Sa-HD  . febuxostat  80 mg Oral Daily  . ferric gluconate (FERRLECIT/NULECIT) IV  125 mg Intravenous Q T,Th,Sa-HD  . insulin aspart  0-9 Units Subcutaneous TID WC  . insulin glargine  17 Units Subcutaneous QHS  . metoprolol tartrate  50 mg Oral TID  . pantoprazole  40 mg Oral Daily  . simvastatin  40 mg Oral QPM  . sodium chloride  3 mL Intravenous Q12H   Continuous Infusions:   PRN Meds:.sodium chloride, sodium chloride, sodium chloride, acetaminophen, diclofenac sodium, docusate sodium, lidocaine (PF), lidocaine-prilocaine, LORazepam, pentafluoroprop-tetrafluoroeth, sodium chloride Assessment/Plan: Principal Problem:   CKD (chronic kidney disease) stage 4, GFR 15-29 ml/min Active Problems:   Type II or unspecified type diabetes mellitus with renal manifestations, uncontrolled(250.42)   DYSLIPIDEMIA   Hypopotassemia   Morbid obesity   ANXIETY   TOBACCO ABUSE   OBSTRUCTIVE SLEEP APNEA   HYPERTENSION   CARDIOMYOPATHY   Chronic systolic heart failure   GERD   Gastric ulcer   Leg swelling   Knee pain   ICD (implantable cardioverter-defibrillator), single, in situ   Normocytic anemia   Open wound of foot   End stage renal disease   # ESRD with severe volume overload refractory to diuretic therapy necessitating emergent HD, with right arm AV cimino fistula and perm cath  -HD 8/14 -  tolerated well, 4L off, episode of hypotension and tachycardia - asymptomatic -HD 8/16 -  3L off  -HD 8/18 -  4L off -HD 8/20 - tacyhcardia,  2.9L off -HD 8/21-  Erratic HR, bp low 100s,  2.9L  off  -HD 8/23 - 2.7L off -On TTS schedule -Carb restricted diet with 1.0L fluid restriction  -Continue daily weight - 24lb wt loss since admission -Monitor for hyperkalemia  -PT - home health PT recommended    #Dyspnea - Patient with h/o of CHF, anemia, and CKD with ongoing dyspnea for past month due to fluid overload requiring emergent hemodialysis. Also with acute on chronic  systolic/ diastolic failure. Since admission, patient has had several episodes of  ventricular tachycardia with atrial fibrillation necessitating AC therapy with heparin drip on 8/18.  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 Argatraban was stopped due to episodes of tachycardia and known cardiac side effects. Instead angiomax started. VT most likely due to new onset atrial fibrillation in setting of CHF exacerbation. Doppler US of bilateral lower extremity with no evidence of clot and patient with  no leg pain. Patient with Geneva score of 3.0 points, indicating low risk for PE 7-9% incidence. Patient has been anitcoagulated since 8/18 making pulmonary embolus less likely. He is presently not dyspneic, tachypneic, or tachycardic and normal oxygen saturation on 2-3L.     -Do not recommend V/Q scan - if low/intermediate probability will not exclude PE. If normal would exclude PE but in setting of vascular congestion and pulmonary edema will most likely not be normal.        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 IV Heparin started on 8/18 with thrombocytopenia on 8/19. Other etiologies include medication induced (keflex started on 8/18, thrombocytopenia on 8/19), however patient with uptrending platlet count since stopping of heparin with continuation of keflex.        -Platlet Count: 230 (8/14) to 189 (8/16)  to 180 (8/18)  to 136 (8/19)  to 131 (8/20)  to 94 (8/21)  to 73 (8/22)  (50% drop in last 7 days) -STOP heparin (8/22)  -Obtain HIT panel - will result on Wed per [facility] -Due to epsiodes of Vtach, argatraban that was started on 8/23 stopped on 8/24 due to risk of  cardiac arrest (6%), ventricular tachycardia (5%), bradycardia (5%), myocardial infarction (PCI: 4%), atrial fibrillation (3%), angina (2%)  -Angiomax per pharmacy - to keep PTT 1.5-2/5x baseline -Do not recommend starting coumadin because of risk of microthrombosis - management per cards  -Well's criteria score 0 (with tachycardia 1.5) low risk, however in setting of possible HIT syndrome and bilateral LE swelling will obtain stat DUS of bilateral extremities to r/o DVT - no DVT or SVT in bilateral extremities  -Monitor for bleeding      # Atrial Fibrallation -  asymptomatic, hemodynamically stable on 8/18  HR 130s, SBP in 90s, 3 episodes of Vtach 8/18  1 episode on 8/19 and 8/20  -Tele with atrial fib/flutter with RVR   -No firing of AICD - no interrogation needed per cards -BMP and Mg levels - WNL -Orthostatic labs - negative  -Bolus as needed if SBP<90 -Cardiology consult -  PO  metprolol 50 mg TID, no digoxin or amiodarone for now  -Due to epsiodes of Vtach, argatraban that was started 8/22 stopped 8/23, per cards to start angiomax per pharmacy once PTT in appropriate range-- keep PTT 1.5-2/5x baseline     # Acute on chronic CHF with EF of 25% - Nonischemic cardiomyopathy with AICD (2011)  -Obtain cardiology consult - PO  metprolol 50mg  TID  Was on heparin and bridge to coumadin until therapeutic INR >2 (INR 1.3 today) -STOP heparin due to 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 - also stop coumadin - CXR (8/15) --> cardiomegaly and pulmonary edema suggesting CHF, no pleural effusion   - Monitor on telemetry -Continue HD to remove excess fluid -On metoprolol 50mg  TID   Left forefoot wound  with paronychia infection of left great toe  - 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 Left great toe with what appears to be an infected paronychia, recommend I & D of paronychia and antibiotics -Obtain Xray left foot  - no osteomyelitis  -Obtain ABI - R 1.41 L 1.39 - suggesting calcified vessels, waveforms of b/l LE WNL -Ortho consult - Per Dr. Eulah Pont no surgical debridement at this time, continue wound care, antibiotics - keflex 250mg  daily on day 6   - will consult ID for duration    - Per Dr. Lajoyce Corners - wound care nurses to apply a Profore wrap 4 both lower extremities. To be d/c with compression wraps and f/u in office for wound care . -DUS bilateral LE - no DVT or SVT in bilateral lower extremities     #DM c/b dyslipidemia - last HbA1c 9.1 on 07/27/2012  -CBG WNL today - Lantus 17U at night  - SSI for meals  - Continue home statin  - Lipid panel 03/2012 - low HDL (26)    #Normocytic Anemia - asymptomatic, most likely due to CKD  -To receive weekly erythropoetin injection, target Hct 33-36% -Iron deficiency (19), TIBC WNL, Iron sat low, to receive IV iron infusion with HD  -Monitor Potassium - 3.4 on 8/14 now WNL     #Hyperphosphatemia -  Resolved, 4.8 on  8/16 - Phosphate restriction 1g/d  -Consider phosphate binder therapy with meals  #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 Nausea w/o vomiting or  abdominal pain, normal BM, likely due to HD and electrolyte/fluid shift;  started 8/15, 1 episode of green emesis on 8/16 and again 8/17 after eating   -Zofran as needed for nausea  #Somnolence - resolved. Pt reports increased sleepiness after starting HD  --ABG on 8/15- hypoxia, hypocapnia, low  bicarbonate - D/c narcotics    #OSA  - CPAP at  night    # Anxiety  - Ativan 0.5 mg prn   #Knee Pain  - Tylenold prn for mild pain  - Tramadol,  Oxycodone AS NEEDED due to somnolence and hypotension    - Voltaren Gel   # Gout  - Febuxostat   #GERD  - Continue home PPI   # Prophylaxis : Angiomax      Dispo: Disposition is deferred at this time, awaiting improvement of current medical problems.  Anticipated discharge in approximately  day(s).   The patient does have a current PCP Lars Masson, MD) and does need an [facility] hospital follow-up appointment after discharge.  The patient does have transportation limitations that hinder transportation to clinic appointments.  .Services Needed at time of discharge: Y = Yes, Blank = No PT:   OT:   RN:   Equipment:   Other:     LOS: 10 days   Otis Brace, MD 09/19/2012, 8:01 AM

## 2012-09-19 NOTE — Progress Notes (Signed)
[name] Progress NOTE    Subjective: Pt feels better today, breathing a little better.  Denies chest palpitations.  He did not eat breakfast.    RN: pending Duplex of b/l legs    Current medications: Current Facility-Administered Medications  Medication Dose Route Frequency Provider Last Rate Last Dose  . 0.9 %  sodium chloride infusion  100 mL Intravenous PRN Lauris Poag, MD      . 0.9 %  sodium chloride infusion  100 mL Intravenous PRN Lauris Poag, MD      . 0.9 %  sodium chloride infusion  250 mL Intravenous PRN Na Dierdre Searles, MD      . acetaminophen (TYLENOL) tablet 650 mg  650 mg Oral Q6H PRN Jonah Blue, DO   650 mg at 09/18/12 2020  . cephALEXin (KEFLEX) capsule 250 mg  250 mg Oral Q2200 Marjan Rabbani, MD   250 mg at 09/18/12 2208  . diclofenac sodium (VOLTAREN) 1 % transdermal gel 2 g  2 g Topical QID PRN Jonah Blue, DO      . docusate sodium (COLACE) capsule 100 mg  100 mg Oral BID PRN Jonah Blue, DO      . doxercalciferol (HECTOROL) injection 2 mcg  2 mcg Intravenous Q T,Th,Sa-HD Irena Cords, MD   2 mcg at 09/18/12 1500  . febuxostat (ULORIC) tablet 80 mg  80 mg Oral Daily Alejandro Paya, DO   80 mg at 09/19/12 0900  . ferric gluconate (NULECIT) 125 mg in sodium chloride 0.9 % 100 mL IVPB  125 mg Intravenous Q T,Th,Sa-HD Irena Cords, MD   125 mg at 09/18/12 1630  . insulin aspart (novoLOG) injection 0-9 Units  0-9 Units Subcutaneous TID WC Linward Headland, MD   1 Units at 09/19/12 0900  . insulin glargine (LANTUS) injection 17 Units  17 Units Subcutaneous QHS Otis Brace, MD   17 Units at 09/18/12 2209  . lidocaine (PF) (XYLOCAINE) 1 % injection 5 mL  5 mL Intradermal PRN Lauris Poag, MD      . lidocaine-prilocaine (EMLA) cream 1 application  1 application Topical PRN Lauris Poag, MD      . LORazepam (ATIVAN) tablet 0.5 mg  0.5 mg Oral BID PRN Jonah Blue, DO   0.5 mg at 09/19/12 0203  . metoprolol (LOPRESSOR) tablet 50 mg   50 mg Oral TID Pricilla Riffle, MD   50 mg at 09/19/12 0900  . pantoprazole (PROTONIX) EC tablet 40 mg  40 mg Oral Daily Linward Headland, MD   40 mg at 09/19/12 0901  . pentafluoroprop-tetrafluoroeth (GEBAUERS) aerosol 1 application  1 application Topical PRN Lauris Poag, MD      . simvastatin (ZOCOR) tablet 40 mg  40 mg Oral QPM Linward Headland, MD   40 mg at 09/18/12 2020  . sodium chloride 0.9 % injection 3 mL  3 mL Intravenous Q12H Na Li, MD   3 mL at 09/14/12 2122  . sodium chloride 0.9 % injection 3 mL  3 mL Intravenous PRN Na Li, MD   3 mL at 09/10/12 1137     Vital Signs: Blood pressure 94/59, pulse 70, temperature 98.4 F (36.9 C), temperature source Oral, resp. rate 20, height 6\' 1"  (1.854 m), weight 339 lb 1.1 oz (153.8 kg), SpO2 98.00%.  Weight trends: Filed Weights   09/18/12 1900 09/19/12 0500 09/19/12 0700  Weight: 346 lb 2 oz (157 kg) 339 lb 1.1 oz (153.8 kg)  339 lb 1.1 oz (153.8 kg)    Physical Exam: General: Vital signs reviewed and noted. Well-developed, obese, in no acute distress;  appropriate and cooperative throughout examination.  Head: Normocephalic, atraumatic.  Lungs:  Ctab  Heart: RRR, no murmurs (intermittently in Afib on telemetry)  Abdomen:  BS normoactive. Soft, obese, non-tender.    Extremities: B/l 1+ edema, no cyanosis, ACE wraps b/l legs   Neurologic: Alert and oriented x 3   Skin: hyperpigmentation and thickening to lower extremities    Lab results: Basic Metabolic Panel:  Recent Labs Lab 09/14/12 0430 09/14/12 1457 09/15/12 0500  09/17/12 0640 09/18/12 0450 09/18/12 1347 09/19/12 0200  NA 136 135 136  < > 136 135 134* 136  K 3.4* 3.7 3.7  < > 4.0 4.1 3.9 4.2  CL 99 99 99  < > 99 97 97 97  CO2 22 20 23   < > 24 22 24 24   GLUCOSE 123* 148* 150*  < > 173* 158* 178* 141*  BUN 57* 59* 65*  < > 34* 44* 46* 26*  CREATININE 3.51* 3.61* 4.04*  < > 2.96* 4.00* 4.26* 2.84*  CALCIUM 9.0 9.0 9.1  < > 9.0 9.2 9.0 9.0  MG 2.0 2.0 1.9  --   --   --  2.0  1.9  PHOS  --   --  3.5  < > 2.6 3.3  --  2.7  < > = values in this interval not displayed.  Liver Function Tests:  Recent Labs Lab 09/18/12 0450 09/18/12 1347 09/19/12 0200  AST  --  13  --   ALT  --  <5  --   ALKPHOS  --  119*  --   BILITOT  --  3.2*  --   PROT  --  7.5  --   ALBUMIN 3.2* 3.0* 3.2*    CBC:  Recent Labs Lab 09/16/12 0525 09/17/12 0640 09/18/12 0450 09/18/12 2220 09/19/12 0200  WBC 9.9 9.1 9.5 8.4 9.5  HGB 8.0* 7.7* 7.8* 7.7* 8.6*  HCT 25.8* 25.3* 25.4* 25.1* 28.4*  MCV 86.3 88.5 87.3 87.5 89.3  PLT 94* 73* 110* 112* 140*   CBG:  Recent Labs Lab 09/17/12 2153 09/18/12 0720 09/18/12 1127 09/18/12 2159 09/19/12 0730  GLUCAP 153* 139* 176* 148* 127*     Imaging: No results found.   Assessment & Plan: [name] is a 47 y.o. yo male with a PMHX of CKD stage 4, dyslipidemia, insomnia, OSA, nonischemic cardiomyopathy, combined systolic and diastolic HF with EF (25%) status post AICD, HTN, uncontrolled DM 2 (9.1 HA1C), obesity tobacco abuse who was admitted to [facility] on 09/09/2012 for evaluation of fluid overload. He presented to the Internal Medicine Clinic with weight gain of 50 lbs during the last month (epic review shows weight 316-->350 lbs), increased shortness of breath, lower extremity edema and abdominal bloating.  He recently had a right AVF placed on 08/31/12.  1. Fluid overload secondary to end stage renal disease  -Renal following while inpatient for HD  -will get HD outpatient at [facility] -renal to dose Aranesp, Hectoral, Fe -trend daily weights and i/o's   2. 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 -cardiology consulted per cards hold Dig with HD. Will need f/u outpatient   3. Atrial fibrillation  -rates more controlled. Lopressor 50 mg tid.   -Cards  following  4.  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  primary following.  -HIT panel pending -was on Angiomax   5. Diabetes 2 uncontrolled (HA1C 9.1 in 07/2012)  -hyperglycemia intermittently. primary team addressing  -Lantus 17 units qhs, SSI  6. Hypotension -Etiology could be related to anemia, medications, heart failure history.   -monitor BP still soft intermittently  7. Lower extremity edema  -likely due to fluid overload (due to renal failure and CHF); Geneva score is low making PE less likely  -okay to do lower extremity doppler.  If negative then will likely not need CTA chest to r/o PE. Patient has been on a Heparin drip this admission and does not have hypoxia  8. Left great toe infection -Continue Abx x 7 days total per ortho   9. F/E/N -trend RFP -renal diet   10. DVT PPX  -[place] boots -pending [place] b/l lower ext -No DVT px due to low plts   .   Desma Maxim MD PGY 2 IM Discussed with attending

## 2012-09-19 NOTE — Progress Notes (Signed)
ANTICOAGULATION CONSULT NOTE - Follow-Up Consult  Pharmacy Consult for Argatroban > change to bivalirudin Indication: R/O HIT; afib  Allergies  Allergen Reactions  . Ace Inhibitors     Acute renal failure with multiple trials  . Heparin     HIT panel pending 8/22    Patient Measurements: Height: 6\' 1"  (185.4 cm) Weight: 339 lb 1.1 oz (153.8 kg) IBW/kg (Calculated) : 79.9   Vital Signs: Temp: 97.5 F (36.4 C) (08/24 1600) Temp src: Oral (08/24 1600) BP: 107/55 mmHg (08/24 1600) Pulse Rate: 70 (08/24 0727)  Labs:  Recent Labs  09/17/12 0640  09/18/12 0450  09/18/12 1347  09/18/12 1909 09/18/12 2220 09/19/12 0200 09/19/12 0714 09/19/12 1212  HGB 7.7*  --  7.8*  --   --   --   --  7.7* 8.6*  --   --   HCT 25.3*  --  25.4*  --   --   --   --  25.1* 28.4*  --   --   PLT 73*  --  110*  --   --   --   --  112* 140*  --   --   APTT  --   < > 136*  < >  --   < >  --  83*  --  76* 77*  LABPROT 16.0*  --   --   --   --   --   --   --   --   --   --   INR 1.31  --   --   --   --   --   --   --   --   --   --   HEPARINUNFRC <0.10*  --   --   --   --   --   --   --   --   --   --   CREATININE 2.96*  --  4.00*  --  4.26*  --   --   --  2.84*  --   --   TROPONINI  --   --   --   --  <0.30  --  <0.30  --  <0.30  --   --   < > = values in this interval not displayed.  Estimated Creatinine Clearance: 49.3 ml/min (by C-G formula based on Cr of 2.84).  Assessment: 47 yo male with complicated PMH including ESRD, new - onset afib and heart failure. Was on IV heparin and platelets decreased >50%-Pharmacy asked to begin argatroban.  8.23 patient experienced VT episodes, QT noted to be prolonged.  Pharmacy consulted asked to switch to bivalirudin in light of the rare incidence of argatroban causing VT and the patients notably elevated aPTT despite multiple aggressive rate decreases.   APTT  Now 77; drawn ~ 1600 today ( despite current lab documentation that lab was drawn at 1212).  Given  multiple hour delay in lab draw, time for accumulation and difficulty sticking patient will defer recheck till am labs.   Goal of Therapy:  aPTT 50-90 seconds Monitor platelets by anticoagulation protocol: Yes   Plan:  -Continue  bivalirudin at 0.025 mcg/kg/hr -daily aPTT -Could consider starting coumadin once platelets >150K -F/u HIT panel results (panel drawn 8/22) -PT/INR in am to determine a baseline INR (likely to be elevated on bivalirudin)   Thank you for allowing pharmacy to be a part of this patients care team.  Lovenia Kim Pharm.D., BCPS Clinical Pharmacist 09/19/2012 5:21  PM Pager: [phone] Phone: [phone]

## 2012-09-19 NOTE — Progress Notes (Signed)
Subjective: Patient is a 47 yo with hx of dilated CM,( normal coronaries)  EF = 25%.  He has been diagnosed with atrial fib.  says he is still SOB  No CP. Hx of HTN, ESRD.  He developed thrombocytopenia several days ago and was thought to have HIT.  He was started on Argatroban but then was found to have episodes of NSVT.    He was transferred to 2900 and watched.  He has not had any further episodes of NSVT and his ICD has not discharged.  Objective: Filed Vitals:   09/19/12 0337 09/19/12 0500 09/19/12 0700 09/19/12 0727  BP: 96/51   94/59  Pulse:    70  Temp: 98.1 F (36.7 C)   98.4 F (36.9 C)  TempSrc: Oral   Oral  Resp: 16   20  Height:      Weight:  339 lb 1.1 oz (153.8 kg) 339 lb 1.1 oz (153.8 kg)   SpO2: 100%   98%   Weight change: 3 lb 1.4 oz (1.4 kg)  Intake/Output Summary (Last 24 hours) at 09/19/12 0755 Last data filed at 09/18/12 1817  Gross per 24 hour  Intake    200 ml  Output   2721 ml  Net  -2521 ml    General: Alert, awake, oriented x3, in no acute distress Neck: Rales at bases. Heart: Irregular rate and rhythm, without murmurs, rubs, gallops.  Lungs: Rales at bases Exemities:  1+ edema.  Legs are wrapped. Neuro: Grossly intact, nonfocal.  Tele:  Afib   100 Lab Results: Results for orders placed during the hospital encounter of 09/09/12 (from the past 24 hour(s))  APTT     Status: Abnormal   Collection Time    09/18/12  9:00 AM      Result Value Range   aPTT 122 (*) 24 - 37 seconds  GLUCOSE, CAPILLARY     Status: Abnormal   Collection Time    09/18/12 11:27 AM      Result Value Range   Glucose-Capillary 176 (*) 70 - 99 mg/dL  MAGNESIUM     Status: None   Collection Time    09/18/12  1:47 PM      Result Value Range   Magnesium 2.0  1.5 - 2.5 mg/dL  COMPREHENSIVE METABOLIC PANEL     Status: Abnormal   Collection Time    09/18/12  1:47 PM      Result Value Range   Sodium 134 (*) 135 - 145 mEq/L   Potassium 3.9  3.5 - 5.1 mEq/L   Chloride 97   96 - 112 mEq/L   CO2 24  19 - 32 mEq/L   Glucose, Bld 178 (*) 70 - 99 mg/dL   BUN 46 (*) 6 - 23 mg/dL   Creatinine, Ser 1.61 (*) 0.50 - 1.35 mg/dL   Calcium 9.0  8.4 - 09.6 mg/dL   Total Protein 7.5  6.0 - 8.3 g/dL   Albumin 3.0 (*) 3.5 - 5.2 g/dL   AST 13  0 - 37 U/L   ALT <5  0 - 53 U/L   Alkaline Phosphatase 119 (*) 39 - 117 U/L   Total Bilirubin 3.2 (*) 0.3 - 1.2 mg/dL   GFR calc non Af Amer 15 (*) >90 mL/min   GFR calc Af Amer 18 (*) >90 mL/min  TROPONIN I     Status: None   Collection Time    09/18/12  1:47 PM      Result  Value Range   Troponin I <0.30  <0.30 ng/mL  APTT     Status: Abnormal   Collection Time    09/18/12  6:00 PM      Result Value Range   aPTT 86 (*) 24 - 37 seconds  TROPONIN I     Status: None   Collection Time    09/18/12  7:09 PM      Result Value Range   Troponin I <0.30  <0.30 ng/mL  MRSA PCR SCREENING     Status: None   Collection Time    09/18/12  7:47 PM      Result Value Range   MRSA by PCR NEGATIVE  NEGATIVE  GLUCOSE, CAPILLARY     Status: Abnormal   Collection Time    09/18/12  9:59 PM      Result Value Range   Glucose-Capillary 148 (*) 70 - 99 mg/dL  APTT     Status: Abnormal   Collection Time    09/18/12 10:20 PM      Result Value Range   aPTT 83 (*) 24 - 37 seconds  CBC     Status: Abnormal   Collection Time    09/18/12 10:20 PM      Result Value Range   WBC 8.4  4.0 - 10.5 K/uL   RBC 2.87 (*) 4.22 - 5.81 MIL/uL   Hemoglobin 7.7 (*) 13.0 - 17.0 g/dL   HCT 16.1 (*) 09.6 - 04.5 %   MCV 87.5  78.0 - 100.0 fL   MCH 26.8  26.0 - 34.0 pg   MCHC 30.7  30.0 - 36.0 g/dL   RDW 40.9 (*) 81.1 - 91.4 %   Platelets 112 (*) 150 - 400 K/uL  RETICULOCYTES     Status: Abnormal   Collection Time    09/18/12 10:20 PM      Result Value Range   Retic Ct Pct 3.2 (*) 0.4 - 3.1 %   RBC. 2.87 (*) 4.22 - 5.81 MIL/uL   Retic Count, Manual 91.8  19.0 - 186.0 K/uL  TROPONIN I     Status: None   Collection Time    09/19/12  2:00 AM      Result  Value Range   Troponin I <0.30  <0.30 ng/mL  CBC     Status: Abnormal   Collection Time    09/19/12  2:00 AM      Result Value Range   WBC 9.5  4.0 - 10.5 K/uL   RBC 3.18 (*) 4.22 - 5.81 MIL/uL   Hemoglobin 8.6 (*) 13.0 - 17.0 g/dL   HCT 78.2 (*) 95.6 - 21.3 %   MCV 89.3  78.0 - 100.0 fL   MCH 27.0  26.0 - 34.0 pg   MCHC 30.3  30.0 - 36.0 g/dL   RDW 08.6 (*) 57.8 - 46.9 %   Platelets 140 (*) 150 - 400 K/uL  MAGNESIUM     Status: None   Collection Time    09/19/12  2:00 AM      Result Value Range   Magnesium 1.9  1.5 - 2.5 mg/dL  RENAL FUNCTION PANEL     Status: Abnormal   Collection Time    09/19/12  2:00 AM      Result Value Range   Sodium 136  135 - 145 mEq/L   Potassium 4.2  3.5 - 5.1 mEq/L   Chloride 97  96 - 112 mEq/L   CO2 24  19 - 32  mEq/L   Glucose, Bld 141 (*) 70 - 99 mg/dL   BUN 26 (*) 6 - 23 mg/dL   Creatinine, Ser 1.61 (*) 0.50 - 1.35 mg/dL   Calcium 9.0  8.4 - 09.6 mg/dL   Phosphorus 2.7  2.3 - 4.6 mg/dL   Albumin 3.2 (*) 3.5 - 5.2 g/dL   GFR calc non Af Amer 25 (*) >90 mL/min   GFR calc Af Amer 29 (*) >90 mL/min    Studies/Results:  Medications:Reviewed  1.  Atrial fibrillation  Rates are much better.  Continue current meds  2.  Acute on chronic systolic CHF.  Lungs are clear.  Legs are still edematous.    3.  CKD  Continue dialysis.    4. Possible HIT:  Argatroban has been stopped.  Still waiting for PTT to come down.  Platelet counts have rebounded.   Plan to start Bi-valirudin once PTT is down.  He will need to be started on coumadin at some point.  Not a candidate for a NOAC because of his renal failure.  Further recs per pharmacy.   LOS: 10 days   Elyn Aquas. 09/19/2012, 7:55 AM

## 2012-09-20 DIAGNOSIS — I4891 Unspecified atrial fibrillation: Secondary | ICD-10-CM

## 2012-09-20 LAB — RENAL FUNCTION PANEL
BUN: 36 mg/dL — ABNORMAL HIGH (ref 6–23)
Glucose, Bld: 142 mg/dL — ABNORMAL HIGH (ref 70–99)
Phosphorus: 4 mg/dL (ref 2.3–4.6)
Potassium: 3.9 mEq/L (ref 3.5–5.1)

## 2012-09-20 LAB — CBC
MCV: 89.7 fL (ref 78.0–100.0)
Platelets: 164 10*3/uL (ref 150–400)
RDW: 18.4 % — ABNORMAL HIGH (ref 11.5–15.5)
WBC: 7.6 10*3/uL (ref 4.0–10.5)

## 2012-09-20 LAB — PROTIME-INR: INR: 2.08 — ABNORMAL HIGH (ref 0.00–1.49)

## 2012-09-20 LAB — GLUCOSE, CAPILLARY
Glucose-Capillary: 198 mg/dL — ABNORMAL HIGH (ref 70–99)
Glucose-Capillary: 142 mg/dL — ABNORMAL HIGH (ref 70–99)
Glucose-Capillary: 125 mg/dL — ABNORMAL HIGH (ref 70–99)
Glucose-Capillary: 118 mg/dL — ABNORMAL HIGH (ref 70–99)
Glucose-Capillary: 132 mg/dL — ABNORMAL HIGH (ref 70–99)

## 2012-09-20 LAB — CARBOXYHEMOGLOBIN
O2 Saturation: 66.2 %
Total hemoglobin: 7.6 g/dL — ABNORMAL LOW (ref 13.5–18.0)

## 2012-09-20 MED ORDER — LIDOCAINE-PRILOCAINE 2.5-2.5 % EX CREA: 1.0000 "application " | TOPICAL_CREAM | CUTANEOUS | Status: DC | PRN

## 2012-09-20 MED ORDER — ANTICOAGULANT SODIUM CITRATE 4% (200MG/5ML) IV SOLN
6.0000 mL | Status: AC
  Administered 2012-09-20: 6 mL via INTRAVENOUS
  Filled 2012-09-20 (×3): qty 250

## 2012-09-20 MED ORDER — WARFARIN SODIUM 5 MG PO TABS
5.0000 mg | ORAL_TABLET | Freq: Once | ORAL | Status: AC
  Administered 2012-09-20: 5 mg via ORAL
  Filled 2012-09-20 (×3): qty 1

## 2012-09-20 MED ORDER — PENTAFLUOROPROP-TETRAFLUOROETH EX AERO
1.0000 | INHALATION_SPRAY | CUTANEOUS | Status: DC | PRN
Start: 2012-09-20 — End: 2012-09-20

## 2012-09-20 MED ORDER — LIDOCAINE HCL (PF) 1 % IJ SOLN: 5.0000 mL | INTRAMUSCULAR | Status: DC | PRN

## 2012-09-20 MED ORDER — WARFARIN - PHARMACIST DOSING INPATIENT
Freq: Every day | Status: DC
  Administered 2012-09-20: 18:00:00

## 2012-09-20 MED ORDER — HEPARIN SODIUM (PORCINE) 1000 UNIT/ML DIALYSIS: 1000.0000 [IU] | INTRAMUSCULAR | Status: DC | PRN

## 2012-09-20 MED ORDER — INSULIN ASPART 100 UNIT/ML ~~LOC~~ SOLN
2.0000 [IU] | Freq: Three times a day (TID) | SUBCUTANEOUS | Status: DC
  Administered 2012-09-20 – 2012-09-27 (×15): 2 [IU] via SUBCUTANEOUS

## 2012-09-20 MED ORDER — INSULIN ASPART 100 UNIT/ML ~~LOC~~ SOLN: 5.0000 [IU] | Freq: Three times a day (TID) | SUBCUTANEOUS | Status: DC

## 2012-09-20 MED ORDER — ALTEPLASE 2 MG IJ SOLR
2.0000 mg | Freq: Once | INTRAMUSCULAR | Status: DC | PRN
  Filled 2012-09-20: qty 2

## 2012-09-20 MED ORDER — SODIUM CHLORIDE 0.9 % IV SOLN: 100.0000 mL | INTRAVENOUS | Status: DC | PRN

## 2012-09-20 MED ORDER — INSULIN GLARGINE 100 UNIT/ML ~~LOC~~ SOLN
19.0000 [IU] | Freq: Every day | SUBCUTANEOUS | Status: DC
  Administered 2012-09-20 – 2012-09-26 (×7): 19 [IU] via SUBCUTANEOUS
  Filled 2012-09-20 (×8): qty 0.19

## 2012-09-20 MED ORDER — NEPRO/CARBSTEADY PO LIQD
237.0000 mL | ORAL | Status: DC | PRN
  Filled 2012-09-20: qty 237

## 2012-09-20 NOTE — Progress Notes (Signed)
PT Cancellation Note  Patient Details Name: Adam [name] MRN: [id number] DOB: 11/16/1965   Cancelled Treatment:    Reason Eval/Treat Not Completed: Patient at procedure or test/unavailable Pt at dialysis. PT to return as able.   Marcene Brawn 09/20/2012, 3:06 PM

## 2012-09-20 NOTE — Progress Notes (Addendum)
ANTICOAGULATION CONSULT NOTE - Follow-Up Consult  Pharmacy Consult for  Bivalirudin, coumadin Indication: R/O HIT; afib  Allergies  Allergen Reactions  . Ace Inhibitors     Acute renal failure with multiple trials  . Heparin     HIT panel pending 8/22    Patient Measurements: Height: 6\' 1"  (185.4 cm) Weight: 350 lb 15.6 oz (159.2 kg) IBW/kg (Calculated) : 79.9   Vital Signs: Temp: 98.2 F (36.8 C) (08/25 0801) Temp src: Oral (08/25 0801) BP: 100/61 mmHg (08/25 0335)  Labs:  Recent Labs  09/18/12 1347  09/18/12 1909 09/18/12 2220 09/19/12 0200 09/19/12 0714 09/19/12 1212 09/20/12 0848  HGB  --   --   --  7.7* 8.6*  --   --  8.1*  HCT  --   --   --  25.1* 28.4*  --   --  26.9*  PLT  --   --   --  112* 140*  --   --  164  APTT  --   < >  --  83*  --  76* 77* 78*  LABPROT  --   --   --   --   --   --   --  22.7*  INR  --   --   --   --   --   --   --  2.08*  CREATININE 4.26*  --   --   --  2.84*  --   --  4.33*  TROPONINI <0.30  --  <0.30  --  <0.30  --   --   --   < > = values in this interval not displayed.  Estimated Creatinine Clearance: 32.9 ml/min (by C-G formula based on Cr of 4.33).  Assessment: 47 yo male with complicated PMH including ESRD, new - onset afib and heart failure. Was on IV heparin and platelets decreased >50%-Pharmacy asked to begin argatroban.  8/23 patient experienced VT episodes, QT noted to be prolonged.  Pharmacy consulted asked to switch to bivalirudin in light of the rare incidence of argatroban causing VT and the patients notably elevated aPTT despite multiple aggressive rate decreases.   APTT today= 78, platelet count= 160 (trend up), dopplers negative for DVT, INR this am was 2.08 (noted 1.31 on 8/22; this change reflects a false INR elevation due to bivalirudin)  Goal of Therapy:  aPTT 50-90 seconds Monitor platelets by anticoagulation protocol: Yes   Plan:  -Continue  bivalirudin at 0.025 mcg/kg/hr -daily aPTT -Could consider  re-starting coumadin if no plans to hold -HIT panel pending  Harland German, Ilda Basset D 09/20/2012 10:12 AM  Addendum: Coumadin -Discussed coumadin therapy with Dr. Tenny Craw. Will begin coumadin today  Plan -Coumadin 5mg  po today -Daily PT/INR -Anticipate false INR elevation due to bivalirudin. The goal INR while the patient is on coumadin and bivalirudin is probably about 3-3.5.    Harland German, Pharm D 09/20/2012 12:55 PM

## 2012-09-20 NOTE — Consult Note (Signed)
WOC to reapply Profore compression wraps bilaterally, verified this am for T/Th/Sat HD, however when I arrived pt has just left for unscheduled HD due to volume overload. Will plan to replace Profore compression bilaterally first thing in the am before HD.    Bedside nurse made aware. Jaysiah Marchetta [place] RN,CWOCN [phone]

## 2012-09-20 NOTE — Procedures (Signed)
Tolerating HD treatment without incident. Adam [name] C

## 2012-09-20 NOTE — Progress Notes (Signed)
Pt. Refused cpap for tonight. Pt. States the mask offered here is not big enough for him. Pt. States he uses a extra large mask at home. RT informed pt. To notify if he changes his mind.

## 2012-09-20 NOTE — Progress Notes (Signed)
Offered to change the dressing on  Left foot and toe and claimed to have it done after hemodialysis.

## 2012-09-20 NOTE — Progress Notes (Signed)
Subjective: Patient appears down.  Breathing is OK now  Gets a little short when he is hot.   Objective: Filed Vitals:   09/19/12 2000 09/19/12 2346 09/20/12 0335 09/20/12 0500  BP: 112/57 93/58 100/61   Pulse:      Temp: 98.6 F (37 C) 98.3 F (36.8 C) 98.5 F (36.9 C)   TempSrc: Oral Oral Oral   Resp: 22 22 22    Height:      Weight:    350 lb 15.6 oz (159.2 kg)  SpO2: 90%  91%    Weight change: 3.5 oz (0.1 kg)  Intake/Output Summary (Last 24 hours) at 09/20/12 0748 Last data filed at 09/20/12 0600  Gross per 24 hour  Intake 1059.77 ml  Output      0 ml  Net 1059.77 ml    General: Alert, awake, oriented x3, in no acute distress Neck:  JVP is normal Heart: Irregular rate and rhythm, without murmurs, rubs, gallops.  Lungs: Clear to auscultation.  No rales or wheezes. Exemities:  1+ edema.   Neuro: Grossly intact, nonfocal.   Lab Results: Results for orders placed during the hospital encounter of 09/09/12 (from the past 24 hour(s))  APTT     Status: Abnormal   Collection Time    09/19/12 12:12 PM      Result Value Range   aPTT 77 (*) 24 - 37 seconds  TECHNOLOGIST SMEAR REVIEW     Status: None   Collection Time    09/19/12 12:12 PM      Result Value Range   Tech Review MORPHOLOGY UNREMARKABLE    GLUCOSE, CAPILLARY     Status: Abnormal   Collection Time    09/19/12 12:40 PM      Result Value Range   Glucose-Capillary 166 (*) 70 - 99 mg/dL  GLUCOSE, CAPILLARY     Status: Abnormal   Collection Time    09/19/12  5:45 PM      Result Value Range   Glucose-Capillary 140 (*) 70 - 99 mg/dL  GLUCOSE, CAPILLARY     Status: Abnormal   Collection Time    09/19/12  9:03 PM      Result Value Range   Glucose-Capillary 198 (*) 70 - 99 mg/dL    Studies/Results: @[id number] @  Medications: Reviewed   @PROBHOSP @  1.  Atrial fib.  Patient remains rate controlled  Rates are better.  On metoprolol.   On angiomax  Will review with pharmacy when to start coumadin I would  rate control for now.  WOuld not push cardioversion unless difficult to remove more fluid.  WIll prob revert to afib quickly unless volume optimized.  2  Acute on chronic systolic CHF  ( R and L sided)  Volume is improving but still up  Not sure of plans for dialysis.  Mentioned 4x per week.  3.  HTN  BP is alittle low at times. Follow.    LOS: 11 days   Dietrich Pates 09/20/2012, 7:48 AM

## 2012-09-20 NOTE — Progress Notes (Signed)
Subjective:  Pt seen and examined in AM. No acute events overnight.  Pt feeling better today. He reports no dyspnea at rest, CP, palpitations, nausea, vomiting, abdominal pain, leg pain, or worsening extremity swelling. He is scheduled for dialysis today.    Objective: Vital signs in last 24 hours: Filed Vitals:   09/20/12 0335 09/20/12 0500 09/20/12 0801 09/20/12 1155  BP: 100/61  99/60 109/74  Pulse:   70 69  Temp: 98.5 F (36.9 C)  98.2 F (36.8 C) 97.7 F (36.5 C)  TempSrc: Oral  Oral Oral  Resp: 22  20 20   Height:      Weight:  159.2 kg (350 lb 15.6 oz)    SpO2: 91%  90% 91%   Weight change: 0.1 kg (3.5 oz)  Intake/Output Summary (Last 24 hours) at 09/20/12 1221 Last data filed at 09/20/12 1100  Gross per 24 hour  Intake  806.1 ml  Output      0 ml  Net  806.1 ml   Constitutional: Obese, NAD Head: Normocephalic Eyes: PERRL Neck: Supple Cardiovascular:  RRR, S1 normal, S2 normal, pitting +2 edema b/l from calves to abdomen.  Pulmonary/Chest: Air entry equal bilaterally. Scatterd ronchi and  wheezing Abdominal: Soft. Non-tender, non-distended, bowel sounds are normal, no masses, organomegaly, or guarding present.  Musculoskeletal: No synovitis, effusions, or erythema of bilateral upper and lower  appendicular joints  Neurological: A&O x3 Skin: both LE wrapped    Psych: depressed mood    Lab Results: Basic Metabolic Panel:  Recent Labs Lab 09/19/12 0200 09/20/12 0848  NA 136 136  K 4.2 3.9  CL 97 97  CO2 24 25  GLUCOSE 141* 142*  BUN 26* 36*  CREATININE 2.84* 4.33*  CALCIUM 9.0 9.4  MG 1.9 1.9  PHOS 2.7 4.0   Liver Function Tests:  Recent Labs Lab 09/18/12 1347 09/19/12 0200 09/20/12 0848  AST 13  --   --   ALT <5  --   --   ALKPHOS 119*  --   --   BILITOT 3.2*  --   --   PROT 7.5  --   --   ALBUMIN 3.0* 3.2* 3.0*   No results found for this basename: LIPASE, AMYLASE,  in the last 168 hours No results found for this basename:  AMMONIA,  in the last 168 hours CBC:  Recent Labs Lab 09/19/12 0200 09/20/12 0848  WBC 9.5 7.6  HGB 8.6* 8.1*  HCT 28.4* 26.9*  MCV 89.3 89.7  PLT 140* 164   Cardiac Enzymes:  Recent Labs Lab 09/18/12 1347 09/18/12 1909 09/19/12 0200  TROPONINI <0.30 <0.30 <0.30   BNP: No results found for this basename: PROBNP,  in the last 168 hours D-Dimer: No results found for this basename: DDIMER,  in the last 168 hours CBG:  Recent Labs Lab 09/18/12 2159 09/19/12 0730 09/19/12 1240 09/19/12 1745 09/19/12 2103 09/20/12 0804  GLUCAP 148* 127* 166* 140* 198* 142*   Hemoglobin A1C: No results found for this basename: HGBA1C,  in the last 168 hours Fasting Lipid Panel: No results found for this basename: CHOL, HDL, LDLCALC, TRIG, CHOLHDL, LDLDIRECT,  in the last 168 hours Thyroid Function Tests: No results found for this basename: TSH, T4TOTAL, FREET4, T3FREE, THYROIDAB,  in the last 168 hours Coagulation:  Recent Labs Lab 09/16/12 0905 09/17/12 0640 09/20/12 0848  LABPROT 16.5* 16.0* 22.7*  INR 1.37 1.31 2.08*   Anemia Panel:  Recent Labs Lab 09/13/12 1300 09/18/12 2220  VITAMINB12  --  728  FOLATE  --  12.8  FERRITIN  --  108  TIBC 406  --   IRON 19*  --   RETICCTPCT  --  3.2*   Urine Drug Screen: Drugs of Abuse     Component Value Date/Time   LABOPIA NEG 03/17/2006 1455   COCAINSCRNUR NEG 03/17/2006 1455   LABBENZ NEG 03/17/2006 1455   AMPHETMU NEG 03/17/2006 1455    Alcohol Level: No results found for this basename: ETH,  in the last 168 hours Urinalysis: No results found for this basename: COLORURINE, APPERANCEUR, LABSPEC, PHURINE, GLUCOSEU, HGBUR, BILIRUBINUR, KETONESUR, PROTEINUR, UROBILINOGEN, NITRITE, LEUKOCYTESUR,  in the last 168 hours Misc. Labs:   Micro Results: Recent Results (from the past 240 hour(s))  MRSA PCR SCREENING     Status: None   Collection Time    09/18/12  7:47 PM      Result Value Range Status   MRSA by PCR NEGATIVE   NEGATIVE Final   Comment:            The GeneXpert MRSA Assay (FDA     approved for NASAL specimens     only), is one component of a     comprehensive MRSA colonization     surveillance program. It is not     intended to diagnose MRSA     infection nor to guide or     monitor treatment for     MRSA infections.   Studies/Results: No results found. Medications: I have reviewed the patient's current medications. Scheduled Meds: . cephALEXin  250 mg Oral Q2200  . doxercalciferol  2 mcg Intravenous Q T,Th,Sa-HD  . febuxostat  80 mg Oral Daily  . ferric gluconate (FERRLECIT/NULECIT) IV  125 mg Intravenous Q T,Th,Sa-HD  . insulin aspart  0-9 Units Subcutaneous TID WC  . insulin glargine  17 Units Subcutaneous QHS  . metoprolol tartrate  50 mg Oral TID  . pantoprazole  40 mg Oral Daily  . simvastatin  40 mg Oral QPM  . sodium chloride  3 mL Intravenous Q12H   Continuous Infusions: . sodium chloride 10 mL/hr at 09/19/12 1050  . bivalirudin (ANGIOMAX) infusion 0.5 mg/mL (Non-ACS indications) 0.025 mg/kg/hr (09/19/12 1046)   PRN Meds:.sodium chloride, sodium chloride, sodium chloride, acetaminophen, diclofenac sodium, docusate sodium, lidocaine (PF), lidocaine-prilocaine, LORazepam, pentafluoroprop-tetrafluoroeth, sodium chloride Assessment/Plan: Principal Problem:   CKD (chronic kidney disease) stage 4, GFR 15-29 ml/min Active Problems:   Type II or unspecified type diabetes mellitus with renal manifestations, uncontrolled(250.42)   DYSLIPIDEMIA   Hypopotassemia   Morbid obesity   ANXIETY   TOBACCO ABUSE   OBSTRUCTIVE SLEEP APNEA   HYPERTENSION   CARDIOMYOPATHY   Chronic systolic heart failure   GERD   Gastric ulcer   Leg swelling   Knee pain   ICD (implantable cardioverter-defibrillator), single, in situ   Normocytic anemia   Open wound of foot   End stage renal disease   # ESRD with severe volume overload refractory to diuretic therapy necessitating emergent HD, with  right arm AV cimino fistula and perm cath  -HD 8/14 -  tolerated well, 4L off, episode of hypotension and tachycardia - asymptomatic -HD 8/16 -  3L off  -HD 8/18 -  4L off -HD 8/20 - tacyhcardia,  2.9L off -HD 8/21-  Erratic HR, bp low 100s,  2.9L off  -HD 8/23 - 2.7L off -HD 8/25 -  -On TTS schedule  -Carb restricted diet with 1.0L fluid restriction  -  Continue daily weight - 362 to 352 wt loss since admission -Monitor for hyperkalemia  -PT - home health PT recommended    #Thrombocytopenia - improving s/p discontinuing heparin.  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 IV Heparin started on 8/18 with thrombocytopenia on 8/19. Other etiologies include medication induced (keflex started on 8/18, thrombocytopenia on 8/19), however patient with uptrending platlet count since stopping of heparin with continuation of keflex.        -Platlet Count: 230 (8/14) to 189 (8/16)  to 180 (8/18) to 136 (8/19)  to 131 (8/20)  to 94 (8/21)  to 73 (8/22)  (50% drop in last 7 days) -STOP heparin (8/22)  -Obtain HIT panel - will result on Wed per [facility] -Due to epsiodes of Vtach, argatraban that was started on 8/23 stopped on 8/24 due to risk of  cardiac arrest (6%), ventricular tachycardia (5%), bradycardia (5%), myocardial infarction (PCI: 4%), atrial fibrillation (3%), angina (2%)  -Angiomax per pharmacy - to keep PTT 1.5-2/5x baseline -Per cards to start coumadin today 8/25 -Do not recommend starting coumadin because of risk of microthrombosis - management per cards  -Well's criteria score 0 (with tachycardia 1.5) low risk, however in setting of possible HIT syndrome and bilateral LE swelling will obtain stat DUS of bilateral extremities to r/o DVT - no DVT or SVT in bilateral extremities  -Monitor for bleeding   #Dyspnea - stable, present with exertion, not at rest.  Patient with h/o of CHF, anemia, and CKD with ongoing dyspnea for past month  due to fluid overload requiring emergent hemodialysis. Also with acute on chronic systolic/ diastolic failure. Since admission, patient has had several episodes of  ventricular tachycardia with atrial fibrillation necessitating AC therapy with heparin drip on 8/18.  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 Argatraban was stopped due to episodes of tachycardia and known cardiac side effects. Instead angiomax started. VT most likely due to new onset atrial fibrillation in setting of CHF exacerbation. Doppler US of bilateral lower extremity with no evidence of clot and patient with  no leg pain. Patient with Geneva score of 3.0 points, indicating low risk for PE 7-9% incidence. Patient has been anitcoagulated since 8/18 making pulmonary embolus less likely. He is presently not dyspneic, tachypneic, or tachycardic and normal oxygen saturation on 2-3L.     -Do not recommend V/Q scan - if low/intermediate probability will not exclude PE. If normal would exclude PE but in setting of vascular congestion and pulmonary edema will most likely not be normal.        -Do not recommend CTA chest with contrast due to contrast induced nephropathy in setting of ERSD  -Obtain carboxyhemoglobin -elevated at 2.6,  reduced oxygen saturation (66%), normal methemoglobin   # Atrial Fibrallation - new onset in setting of volume overload (ESRD and CHF)  -No firing of AICD - no interrogation needed per cards -Cardiology consult -  PO  metprolol 50 mg TID, rate control for now, cardioversion if needed  - Started on coumadin today 8/25    # Acute on chronic CHF with EF of 25% - Nonischemic cardiomyopathy with AICD (2011)  -Obtain cardiology consult - PO  metprolol 50mg  TID  Was on heparin and bridge to coumadin until therapeutic INR >2 (INR 1.3 today) -STOP heparin due to 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 - also stop coumadin - CXR (8/15) --> cardiomegaly and pulmonary edema suggesting CHF, no pleural  effusion   - Monitor on telemetry -Continue HD to remove excess fluid -On metoprolol 50mg  TID   Left forefoot wound with paronychia infection of left great toe  - 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 Left great toe with what appears to be an infected paronychia, recommend I & D of paronychia and antibiotics -Obtain Xray left foot  - no osteomyelitis  -Obtain ABI - R 1.41 L 1.39 - suggesting calcified vessels, waveforms of b/l LE WNL -Ortho consult - Per Dr. Eulah Pont no surgical debridement at this time, continue wound care, antibiotics - keflex 250mg  daily on day 6/10     - Per Dr. Lajoyce Corners - wound care nurses to apply a Profore wrap 4 both lower extremities. To be d/c with compression wraps and f/u in office for wound care . -DUS bilateral LE - no DVT or SVT in bilateral lower extremities     #DM c/b dyslipidemia - last HbA1c 9.1 on 07/27/2012  -CBG WNL today - Lantus 19U at night - Novolog 2U  - SSI for meals  - Continue home statin  - Lipid panel 03/2012 - low HDL (26)  #Normocytic Anemia - asymptomatic, most likely due to CKD  -To receive weekly erythropoetin injection, target Hct 33-36% -Iron deficiency (19), TIBC WNL, Iron sat low, to receive IV iron infusion with HD  -Monitor Potassium - 3.4 on 8/14 now WNL     #Hyperphosphatemia -  Resolved, 4.8 on  8/16 - Phosphate restriction 1g/d  -Consider phosphate binder therapy with meals  #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 Nausea w/o vomiting or  abdominal pain, normal BM, likely due to HD and electrolyte/fluid shift;  started 8/15, 1 episode of green emesis on 8/16 and again 8/17 after eating     -Zofran as needed for nausea  #Somnolence - resolved. Pt reports increased sleepiness after starting HD  --ABG on 8/15- hypoxia, hypocapnia, low bicarbonate - D/c narcotics    #OSA  - CPAP at  night    # Anxiety  - Ativan 0.5 mg prn   #Knee Pain  - Tylenold prn for mild pain  - Tramadol,  Oxycodone AS NEEDED due to somnolence and hypotension    - Voltaren Gel   # Gout  - Febuxostat   #GERD  - Continue home PPI   # Prophylaxis : Angiomax      Dispo: Disposition is deferred at this time, awaiting improvement of current medical problems.  Anticipated discharge in approximately  day(s).   The patient does have a current PCP Lars Masson, MD) and does need an [facility] hospital follow-up appointment after discharge.  The patient does have transportation limitations that hinder transportation to clinic appointments.  .Services Needed at time of discharge: Y = Yes, Blank = No PT:   OT:   RN:   Equipment:   Other:     LOS: 11 days   Otis Brace, MD 09/20/2012, 12:21 PM

## 2012-09-20 NOTE — Progress Notes (Signed)
   Fluid overload secondary to end stage renal disease & CHF will get HD outpatient at [facility]  Plan will do HD today for volume and decide about repeat in AM. Will do 5 hours.  Would like to avoid 4 days per week treatments.  Subjective: Interval History: Still DOE and occ "hot" when heart speeds up.  On angiomax.  Objective: Vital signs in last 24 hours: Temp:  [97.5 F (36.4 C)-98.6 F (37 C)] 98.2 F (36.8 C) (08/25 1220) Pulse Rate:  [69-105] 79 (08/25 1245) Resp:  [20-24] 24 (08/25 1220) BP: (93-112)/(55-75) 98/75 mmHg (08/25 1245) SpO2:  [90 %-95 %] 95 % (08/25 1220) Weight:  [159.2 kg (350 lb 15.6 oz)-160 kg (352 lb 11.8 oz)] 160 kg (352 lb 11.8 oz) (08/25 1220) Weight change: 0.1 kg (3.5 oz)  Intake/Output from previous day: 08/24 0701 - 08/25 0700 In: 1077.5 [P.O.:720; I.V.:357.5] Out: -  Intake/Output this shift: Total I/O In: 229.8 [P.O.:150; I.V.:79.8] Out: -   General appearance: alert and cooperative Resp: clear to auscultation bilaterally Chest wall: no tenderness Cardio: irregularly irregular rhythm Extremities: edema 2-3+ but better  Lab Results:  Recent Labs  09/19/12 0200 09/20/12 0848  WBC 9.5 7.6  HGB 8.6* 8.1*  HCT 28.4* 26.9*  PLT 140* 164   BMET:  Recent Labs  09/19/12 0200 09/20/12 0848  NA 136 136  K 4.2 3.9  CL 97 97  CO2 24 25  GLUCOSE 141* 142*  BUN 26* 36*  CREATININE 2.84* 4.33*  CALCIUM 9.0 9.4   No results found for this basename: PTH,  in the last 72 hours Iron Studies:  Recent Labs  09/18/12 2220  FERRITIN 108   Studies/Results: No results found.  Scheduled: . cephALEXin  250 mg Oral Q2200  . doxercalciferol  2 mcg Intravenous Q T,Th,Sa-HD  . febuxostat  80 mg Oral Daily  . ferric gluconate (FERRLECIT/NULECIT) IV  125 mg Intravenous Q T,Th,Sa-HD  . insulin aspart  0-9 Units Subcutaneous TID WC  . insulin glargine  17 Units Subcutaneous QHS  . metoprolol tartrate  50 mg Oral TID  . pantoprazole  40  mg Oral Daily  . simvastatin  40 mg Oral QPM  . sodium chloride  3 mL Intravenous Q12H    LOS: 11 days   Adam [name] C 09/20/2012,12:51 PM

## 2012-09-21 ENCOUNTER — Encounter (HOSPITAL_COMMUNITY): Payer: Self-pay | Admitting: Physician Assistant

## 2012-09-21 LAB — RENAL FUNCTION PANEL
Albumin: 3 g/dL — ABNORMAL LOW (ref 3.5–5.2)
CO2: 25 mEq/L (ref 19–32)
Chloride: 100 mEq/L (ref 96–112)
GFR calc Af Amer: 23 mL/min — ABNORMAL LOW (ref >90)
GFR calc non Af Amer: 20 mL/min — ABNORMAL LOW (ref >90)
Potassium: 4.1 mEq/L (ref 3.5–5.1)
Sodium: 138 mEq/L (ref 135–145)

## 2012-09-21 LAB — CBC
HCT: 27.5 % — ABNORMAL LOW (ref 39.0–52.0)
MCH: 27.1 pg (ref 26.0–34.0)
MCHC: 29.8 g/dL — ABNORMAL LOW (ref 30.0–36.0)
MCV: 90.8 fL (ref 78.0–100.0)
RDW: 18.8 % — ABNORMAL HIGH (ref 11.5–15.5)

## 2012-09-21 LAB — GLUCOSE, CAPILLARY
Glucose-Capillary: 107 mg/dL — ABNORMAL HIGH (ref 70–99)
Glucose-Capillary: 152 mg/dL — ABNORMAL HIGH (ref 70–99)

## 2012-09-21 LAB — APTT: aPTT: 79 seconds — ABNORMAL HIGH (ref 24–37)

## 2012-09-21 MED ORDER — SODIUM CHLORIDE 0.9 % IV SOLN: 100.0000 mL | INTRAVENOUS | Status: DC | PRN

## 2012-09-21 MED ORDER — DARBEPOETIN ALFA-POLYSORBATE 60 MCG/0.3ML IJ SOLN
INTRAMUSCULAR | Status: AC
  Administered 2012-09-21: 60 ug via INTRAVENOUS
  Filled 2012-09-21: qty 0.3

## 2012-09-21 MED ORDER — PENTAFLUOROPROP-TETRAFLUOROETH EX AERO: 1.0000 "application " | INHALATION_SPRAY | CUTANEOUS | Status: DC | PRN

## 2012-09-21 MED ORDER — METOPROLOL SUCCINATE ER 50 MG PO TB24
50.0000 mg | ORAL_TABLET | Freq: Two times a day (BID) | ORAL | Status: DC
  Administered 2012-09-21 – 2012-09-22 (×3): 50 mg via ORAL
  Filled 2012-09-21 (×6): qty 1

## 2012-09-21 MED ORDER — LIDOCAINE-PRILOCAINE 2.5-2.5 % EX CREA: 1.0000 "application " | TOPICAL_CREAM | CUTANEOUS | Status: DC | PRN

## 2012-09-21 MED ORDER — NEPRO/CARBSTEADY PO LIQD
237.0000 mL | ORAL | Status: DC | PRN
  Filled 2012-09-21: qty 237

## 2012-09-21 MED ORDER — DOXERCALCIFEROL 4 MCG/2ML IV SOLN
INTRAVENOUS | Status: AC
  Administered 2012-09-21: 2 ug via INTRAVENOUS
  Filled 2012-09-21: qty 2

## 2012-09-21 MED ORDER — ALTEPLASE 2 MG IJ SOLR: 2.0000 mg | Freq: Once | INTRAMUSCULAR | Status: AC | PRN

## 2012-09-21 MED ORDER — DARBEPOETIN ALFA-POLYSORBATE 60 MCG/0.3ML IJ SOLN
60.0000 ug | INTRAMUSCULAR | Status: AC
  Administered 2012-09-21: 60 ug via INTRAVENOUS
  Filled 2012-09-21: qty 0.3

## 2012-09-21 MED ORDER — LIDOCAINE HCL (PF) 1 % IJ SOLN: 5.0000 mL | INTRAMUSCULAR | Status: DC | PRN

## 2012-09-21 MED ORDER — HEPARIN SODIUM (PORCINE) 1000 UNIT/ML DIALYSIS: 1000.0000 [IU] | INTRAMUSCULAR | Status: DC | PRN

## 2012-09-21 MED ORDER — WARFARIN SODIUM 7.5 MG PO TABS
7.5000 mg | ORAL_TABLET | Freq: Once | ORAL | Status: AC
  Administered 2012-09-21: 7.5 mg via ORAL
  Filled 2012-09-21: qty 1

## 2012-09-21 MED ORDER — METOPROLOL TARTRATE 50 MG PO TABS
75.0000 mg | ORAL_TABLET | Freq: Two times a day (BID) | ORAL | Status: DC
  Administered 2012-09-21: 75 mg via ORAL
  Filled 2012-09-21 (×2): qty 1

## 2012-09-21 NOTE — Progress Notes (Signed)
  Date: 09/21/2012  Patient name: GABERIEL YOUNGBLOOD  Medical record number: [id number]  Date of birth: 1965/09/26   This patient has been seen and the plan of care was discussed with the house staff. Please see their note for complete details. I concur with their findings with the following additions/corrections:  Platelets recovering nicely off of heparin.  Timeline for HIT does not exactly fit Mr. Rossbach decline in platelets, however, HIT can be more severe if exposed to heparin previously.  I did a chart review and it appears Mr. Umanzor was exposed to heparin at a hospital stay in early August.  The fact that his platelets are uptrending in the setting of stopping heparin also points to HIT.  Hopefully the HIT panel will result tomorrow.   Inez Catalina, MD 09/21/2012, 1:03 PM

## 2012-09-21 NOTE — Progress Notes (Signed)
Pt HR 140's - 150's, informed Dr. Delano Metz; told him I had decreased goal from 4L to 3L; decreased BFR to 300 and UF off. Ordered to take pt out of uf for the rest of tx if HR stays high. Problem resolved with my interventions; was able to turn UF on;  tx completed and pt returned to his room without complications.

## 2012-09-21 NOTE — Progress Notes (Signed)
Pt. Refused cpap. RT informed pt. To notify if he changes his mind. 

## 2012-09-21 NOTE — Progress Notes (Signed)
Physical Therapy Treatment Patient Details Name: Adam [name] DOBIE MRN: [id number] DOB: 1965-11-17 Today's Date: 09/21/2012 Time: 4098-1191 PT Time Calculation (min): 25 min  PT Assessment / Plan / Recommendation  History of Present Illness Pt adm to [facility] due to 50lb weight gain in less than a month, pt admits to increased SOB recenty and Lt LE pain/swelling from fluid retention. Pt with unctrolled diabetes and cardiomyopathy.     PT Comments   Pt with + SOB and decreased ambulation tolerance due to + SOB, per patient "I have to much fluid on my legs with these new wraps." Pt con't to be motivated. Pt did require maxA for LB dressing due to inability to reach down or bring legs up to don pants. PT to con't to follow to progress mobility.   Follow Up Recommendations  Home health PT;Supervision for mobility/OOB     Does the patient have the potential to tolerate intense rehabilitation     Barriers to Discharge        Equipment Recommendations  None recommended by PT    Recommendations for Other Services    Frequency Min 3X/week   Progress towards PT Goals Progress towards PT goals: Progressing toward goals  Plan Current plan remains appropriate    Precautions / Restrictions Precautions Precautions: None Restrictions Weight Bearing Restrictions: No   Pertinent Vitals/Pain Denies pain    Mobility  Bed Mobility Bed Mobility: Not assessed Transfers Transfers: Sit to Stand;Stand to Sit Sit to Stand: From bed;With upper extremity assist;5: Supervision Stand to Sit: 5: Supervision;To bed;With upper extremity assist Details for Transfer Assistance: v/c's for hand placement Ambulation/Gait Ambulation/Gait Assistance: 4: Min guard Ambulation Distance (Feet): 60 Feet Assistive device: Rolling walker Ambulation/Gait Assistance Details: + SOB requiring freq standing rest breaks Gait Pattern: Step-to pattern;Decreased stance time - left;Decreased step length - right;Wide base of  support;Trunk flexed Gait velocity: slow Stairs: No Wheelchair Mobility Wheelchair Mobility: No    Exercises     PT Diagnosis:    PT Problem List:   PT Treatment Interventions:     PT Goals (current goals can now be found in the care plan section) Acute Rehab PT Goals Patient Stated Goal: decrease swelling  Visit Information  Last PT Received On: 09/21/12 Assistance Needed: +1 History of Present Illness: Pt adm to [facility] due to 50lb weight gain in less than a month, pt admits to increased SOB recenty and Lt LE pain/swelling from fluid retention. Pt with unctrolled diabetes and cardiomyopathy.      Subjective Data  Patient Stated Goal: decrease swelling   Cognition  Cognition Arousal/Alertness: Awake/alert Behavior During Therapy: WFL for tasks assessed/performed Overall Cognitive Status: Within Functional Limits for tasks assessed    Balance     End of Session PT - End of Session Equipment Utilized During Treatment: Gait belt Activity Tolerance: Patient limited by fatigue Patient left: in chair;with call bell/phone within reach;with nursing/sitter in room Nurse Communication: Mobility status   GP     Marcene Brawn 09/21/2012, 12:32 PM  Lewis Shock, PT, DPT Pager #: [phone] Office #: [phone]

## 2012-09-21 NOTE — Progress Notes (Addendum)
Subjective:  Pt seen and examined. No acute events overnight. Pt doing well. Will have HD today. No reports of CP, dyspnea at rest, palpitations, leg pain, nausea, vomiting, or abdominal pain. Normal BM and urination.    Objective: Vital signs in last 24 hours: Filed Vitals:   09/20/12 1957 09/20/12 2354 09/21/12 0340 09/21/12 0733  BP:  104/56 108/65   Pulse:      Temp:  98.7 F (37.1 C) 98.3 F (36.8 C) 98.9 F (37.2 C)  TempSrc:  Oral Oral Oral  Resp: 20 22 18 18   Height:      Weight:   153.4 kg (338 lb 3 oz)   SpO2:  95% 97% 100%   Weight change: 0.8 kg (1 lb 12.2 oz)  Intake/Output Summary (Last 24 hours) at 09/21/12 0737 Last data filed at 09/21/12 0600  Gross per 24 hour  Intake  723.1 ml  Output   3200 ml  Net -2476.9 ml   Constitutional: Obese, NAD Head: Normocephalic Eyes: PERRL Neck: Supple Cardiovascular:  RRR, S1 normal, S2 normal, pitting +2 edema b/l from calves to abdomen.  Pulmonary/Chest: Air entry equal bilaterally. Clear to auscultation bilaterally Abdominal: Soft. Non-tender, non-distended, bowel sounds are normal, no masses, organomegaly, or guarding present.  Musculoskeletal: No synovitis, effusions, or erythema of bilateral upper and lower  appendicular joints  Neurological: A&O x3 Skin: both LE wrapped    Psych: depressed mood    Lab Results: Basic Metabolic Panel:  Recent Labs Lab 09/20/12 0848 09/21/12 0440  NA 136 138  K 3.9 4.1  CL 97 100  CO2 25 25  GLUCOSE 142* 130*  BUN 36* 24*  CREATININE 4.33* 3.41*  CALCIUM 9.4 9.0  MG 1.9 1.9  PHOS 4.0 2.9   Liver Function Tests:  Recent Labs Lab 09/18/12 1347  09/20/12 0848 09/21/12 0440  AST 13  --   --   --   ALT <5  --   --   --   ALKPHOS 119*  --   --   --   BILITOT 3.2*  --   --   --   PROT 7.5  --   --   --   ALBUMIN 3.0*  < > 3.0* 3.0*  < > = values in this interval not displayed. No results found for this basename: LIPASE, AMYLASE,  in the last 168  hours No results found for this basename: AMMONIA,  in the last 168 hours CBC:  Recent Labs Lab 09/20/12 0848 09/21/12 0440  WBC 7.6 7.7  HGB 8.1* 8.2*  HCT 26.9* 27.5*  MCV 89.7 90.8  PLT 164 170   Cardiac Enzymes:  Recent Labs Lab 09/18/12 1347 09/18/12 1909 09/19/12 0200  TROPONINI <0.30 <0.30 <0.30   BNP: No results found for this basename: PROBNP,  in the last 168 hours D-Dimer: No results found for this basename: DDIMER,  in the last 168 hours CBG:  Recent Labs Lab 09/19/12 2103 09/20/12 0804 09/20/12 1153 09/20/12 1838 09/20/12 2131 09/21/12 0237  GLUCAP 198* 142* 125* 118* 132* 144*   Hemoglobin A1C: No results found for this basename: HGBA1C,  in the last 168 hours Fasting Lipid Panel: No results found for this basename: CHOL, HDL, LDLCALC, TRIG, CHOLHDL, LDLDIRECT,  in the last 168 hours Thyroid Function Tests: No results found for this basename: TSH, T4TOTAL, FREET4, T3FREE, THYROIDAB,  in the last 168 hours Coagulation:  Recent Labs Lab 09/16/12 0905 09/17/12 0640 09/20/12 0848 09/21/12 0440  LABPROT 16.5* 16.0* 22.7* 20.9*  INR 1.37 1.31 2.08* 1.86*   Anemia Panel:  Recent Labs Lab 09/18/12 2220  VITAMINB12 728  FOLATE 12.8  FERRITIN 108  RETICCTPCT 3.2*   Urine Drug Screen: Drugs of Abuse     Component Value Date/Time   LABOPIA NEG 03/17/2006 1455   COCAINSCRNUR NEG 03/17/2006 1455   LABBENZ NEG 03/17/2006 1455   AMPHETMU NEG 03/17/2006 1455    Alcohol Level: No results found for this basename: ETH,  in the last 168 hours Urinalysis: No results found for this basename: COLORURINE, APPERANCEUR, LABSPEC, PHURINE, GLUCOSEU, HGBUR, BILIRUBINUR, KETONESUR, PROTEINUR, UROBILINOGEN, NITRITE, LEUKOCYTESUR,  in the last 168 hours Misc. Labs:   Micro Results: Recent Results (from the past 240 hour(s))  MRSA PCR SCREENING     Status: None   Collection Time    09/18/12  7:47 PM      Result Value Range Status   MRSA by PCR NEGATIVE   NEGATIVE Final   Comment:            The GeneXpert MRSA Assay (FDA     approved for NASAL specimens     only), is one component of a     comprehensive MRSA colonization     surveillance program. It is not     intended to diagnose MRSA     infection nor to guide or     monitor treatment for     MRSA infections.   Studies/Results: No results found. Medications: I have reviewed the patient's current medications. Scheduled Meds: . cephALEXin  250 mg Oral Q2200  . doxercalciferol  2 mcg Intravenous Q T,Th,Sa-HD  . febuxostat  80 mg Oral Daily  . ferric gluconate (FERRLECIT/NULECIT) IV  125 mg Intravenous Q T,Th,Sa-HD  . insulin aspart  0-9 Units Subcutaneous TID WC  . insulin aspart  2 Units Subcutaneous TID WC  . insulin glargine  19 Units Subcutaneous QHS  . metoprolol tartrate  50 mg Oral TID  . pantoprazole  40 mg Oral Daily  . simvastatin  40 mg Oral QPM  . sodium chloride  3 mL Intravenous Q12H  . Warfarin - Pharmacist Dosing Inpatient   Does not apply q1800   Continuous Infusions: . sodium chloride 10 mL/hr at 09/19/12 1050  . bivalirudin (ANGIOMAX) infusion 0.5 mg/mL (Non-ACS indications) 0.025 mg/kg/hr (09/19/12 1046)   PRN Meds:.sodium chloride, sodium chloride, sodium chloride, acetaminophen, diclofenac sodium, docusate sodium, lidocaine (PF), lidocaine-prilocaine, LORazepam, pentafluoroprop-tetrafluoroeth, sodium chloride Assessment/Plan: Principal Problem:   CKD (chronic kidney disease) stage 4, GFR 15-29 ml/min Active Problems:   Type II or unspecified type diabetes mellitus with renal manifestations, uncontrolled(250.42)   DYSLIPIDEMIA   Hypopotassemia   Morbid obesity   ANXIETY   TOBACCO ABUSE   OBSTRUCTIVE SLEEP APNEA   HYPERTENSION   CARDIOMYOPATHY   Chronic systolic heart failure   GERD   Gastric ulcer   Leg swelling   Knee pain   ICD (implantable cardioverter-defibrillator), single, in situ; [employer]   Normocytic anemia   Open wound of foot   End  stage renal disease   #Thrombocytopenia - improving s/p discontinuing heparin. 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 IV Heparin started on 8/18 with thrombocytopenia on 8/19. Other etiologies include medication induced (keflex started on 8/18, thrombocytopenia on 8/19), however patient with uptrending platlet count since stopping of heparin with continuation of keflex.        -Platlet Count: 230 (8/14) to  189 (8/16)  to 180 (8/18) to 136 (8/19)  to 131 (8/20)  to 94 (8/21)  to 73 (8/22)  (50% drop in last 7 days) -STOP heparin (8/22)  -Obtain HIT panel - will result on Wed per [facility] -Due to epsiodes of Vtach, argatraban that was started on 8/23 stopped on 8/24 due to risk of  cardiac arrest (6%), ventricular tachycardia (5%), bradycardia (5%), myocardial infarction (PCI: 4%), atrial fibrillation (3%), angina (2%)  -Angiomax per pharmacy  -On coumadin since  8/25 - 8/24 DUS of bilateral extremities to r/o DVT - no DVT or SVT in bilateral extremities  -Monitor for bleeding    # ESRD with severe volume overload refractory to diuretic therapy necessitating emergent HD, with right arm AV cimino fistula and perm cath  -HD 8/14 -  tolerated well, 4L off, episode of hypotension and tachycardia - asymptomatic -HD 8/16 -  3L off  -HD 8/18 -  4L off -HD 8/20 - tacyhcardia,  2.9L off -HD 8/21-  Erratic HR, bp low 100s,  2.9L off  -HD 8/23 - 2.7L off -HD 8/25 - 3.2L -Carb restricted diet with 1.0L fluid restriction  -Continue daily weight - 363 to 334 wt loss since admission -Monitor for hyperkalemia  -PT - home health PT recommended -OT evaluation  -Pt to have advanced home health care -Pt to call medicaid transportation for options regarding transportation services to HD  -Per Dr. Lowell Guitar to receive HD 3x/week as outpatient -on TTS schedule  #Dyspnea - stable, present with exertion, not at rest.  Patient with h/o of CHF, anemia,  and CKD with ongoing dyspnea for past month due to fluid overload requiring emergent hemodialysis. Also with acute on chronic systolic/ diastolic failure. Since admission, patient has had several episodes of  ventricular tachycardia with atrial fibrillation necessitating AC therapy with heparin drip on 8/18.  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 Argatraban was stopped due to episodes of tachycardia and known cardiac side effects. Instead angiomax started. VT most likely due to new onset atrial fibrillation in setting of CHF exacerbation. Doppler US of bilateral lower extremity with no evidence of clot and patient with  no leg pain. Patient with Geneva score of 3.0 points, indicating low risk for PE 7-9% incidence. Patient has been anitcoagulated since 8/18 making pulmonary embolus less likely. He is presently not dyspneic, tachypneic, or tachycardic and normal oxygen saturation on 2-3L.     -Do not recommend V/Q scan - if low/intermediate probability will not exclude PE. If normal would exclude PE but in setting of vascular congestion and pulmonary edema will most likely not be normal.        -Do not recommend CTA chest with contrast due to contrast induced nephropathy in setting of ERSD  -Obtain carboxyhemoglobin -elevated at 2.6,  reduced oxygen saturation (66%), normal methemoglobin   # Atrial Fibrallation - new onset in setting of volume overload (ESRD and CHF)  -No firing of AICD - no interrogation needed per cards - Cardiology consult - rate control for now, cardioversion if needed  -Heart Failure consult - Transition metoprolol to Toprol XL 50mg  BID, no events per interrogation of device  -On cou[facility] 8/25 for [facility]     # Acute on chronic CHF with EF of 25% - Nonischemic cardiomyopathy with AICD (2011)  -Obtain cardiology consult - PO  metprolol 50mg  TID  Was on heparin and bridge to coumadin until therapeutic INR >2 (INR 1.3 today) -STOP heparin due to  concern  for HIT syndrome - also stop coumadin - CXR (8/15) --> cardiomegaly and pulmonary edema suggesting CHF, no pleural effusion   - Monitor on telemetry -Continue HD to remove excess fluid -Heart Failure consult -  Transition metoprolol to Toprol XL 50mg  BID, hold ACEi, Bidil   Left forefoot wound with paronychia infection of left great toe  - 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 Left great toe with what appears to be an infected paronychia, recommend I & D of paronychia and antibiotics -Obtain Xray left foot  - no osteomyelitis  -Obtain ABI - R 1.41 L 1.39 - suggesting calcified vessels, waveforms of b/l LE WNL -Ortho consult - Per Dr. Eulah Pont no surgical debridement at this time, continue wound care, antibiotics - keflex 250mg  daily on day 7/10     - Per Dr. Lajoyce Corners - wound care nurses to apply a Profore wrap 4 both lower extremities. To be d/c with compression wraps and f/u in office for wound care . -DUS bilateral LE - no DVT or SVT in bilateral lower extremities     #DM c/b dyslipidemia - last HbA1c 9.1 on 07/27/2012  -CBG WNL today - Lantus 19U at night - Novolog 2U  - SSI for meal-time coverage  - Continue home statin  - Lipid panel 03/2012 - low HDL (26)  #Normocytic Anemia - asymptomatic, most likely due to CKD  -To receive weekly erythropoetin injection, target Hct 33-36% -Iron deficiency (19), TIBC WNL, Iron sat low, to receive IV iron infusion with HD  -Monitor Potassium - 3.4 on 8/14 now WNL     #Hyperphosphatemia -  Resolved, 4.8 on  8/16 - Phosphate restriction 1g/d  -Consider phosphate binder therapy with meals  #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 Nausea w/o vomiting or  abdominal pain, normal BM, likely due to HD and electrolyte/fluid shift;  started 8/15, 1 episode of green emesis on 8/16 and again 8/17 after eating   -Zofran as needed for nausea  #Somnolence - resolved. Pt reports increased sleepiness after starting HD  --ABG on 8/15- hypoxia, hypocapnia, low bicarbonate - D/c narcotics    #OSA  - CPAP at  night    # Anxiety  - Ativan 0.5 mg prn   #Knee Pain  - Tylenold prn for mild pain  - Tramadol,  Oxycodone AS NEEDED due to somnolence and hypotension    - Voltaren Gel   # Gout  - Febuxostat   #GERD  - Continue home PPI   # Prophylaxis : Angiomax & coumadin       Dispo: Disposition is deferred at this time, awaiting improvement of current medical problems.  Anticipated discharge in approximately  day(s).   The patient does have a current PCP Lars Masson, MD) and does need an [facility] hospital follow-up appointment after discharge.  The patient does have transportation limitations that hinder transportation to clinic appointments.  .Services Needed at time of discharge: Y = Yes, Blank = No PT:   OT:   RN:   Equipment:   Other:     LOS: 12 days   Otis Brace, MD 09/21/2012, 7:37 AM

## 2012-09-21 NOTE — Progress Notes (Signed)
  Date: 09/21/2012  Patient name: Adam [name]  Medical record number: [id number]  Date of birth: October 30, 1965   This patient has been seen and the plan of care was discussed with the house staff. Please see their note for complete details. I concur with their findings.  Inez Catalina, MD 09/21/2012, 11:08 AM

## 2012-09-21 NOTE — Progress Notes (Signed)
Patient Name: Adam [name] Date of Encounter: 09/21/2012  Principal Problem:   CKD (chronic kidney disease) stage 4, GFR 15-29 ml/min Active Problems:   Type II or unspecified type diabetes mellitus with renal manifestations, uncontrolled(250.42)   DYSLIPIDEMIA   Hypopotassemia   Morbid obesity   ANXIETY   TOBACCO ABUSE   OBSTRUCTIVE SLEEP APNEA   HYPERTENSION   CARDIOMYOPATHY   Chronic systolic heart failure   GERD   Gastric ulcer   Leg swelling   Knee pain   ICD (implantable cardioverter-defibrillator), single, in situ; [employer]   Normocytic anemia   Open wound of foot   End stage renal disease    SUBJECTIVE: No chest pain, no palpitations, SOB is improving.  OBJECTIVE Filed Vitals:   09/20/12 1947 09/20/12 1957 09/20/12 2354 09/21/12 0340  BP: 90/51  104/56 108/65  Pulse: 75     Temp: 98.2 F (36.8 C)  98.7 F (37.1 C) 98.3 F (36.8 C)  TempSrc: Oral  Oral Oral  Resp: 26 20 22 18   Height:      Weight:    338 lb 3 oz (153.4 kg)  SpO2: 97%  95% 97%    Intake/Output Summary (Last 24 hours) at 09/21/12 0731 Last data filed at 09/21/12 0600  Gross per 24 hour  Intake  723.1 ml  Output   3200 ml  Net -2476.9 ml   Filed Weights   09/20/12 0500 09/20/12 1220 09/21/12 0340  Weight: 350 lb 15.6 oz (159.2 kg) 352 lb 11.8 oz (160 kg) 338 lb 3 oz (153.4 kg)    PHYSICAL EXAM General: Well developed, well nourished, male in no acute distress. Head: Normocephalic, atraumatic.  Neck: Supple without bruits, JVD slightly elevated. Lungs:  Resp regular and unlabored, CTA. Heart: RRR, S1, S2, no S3, S4, or murmur; no rub. Abdomen: Soft, non-tender, non-distended, BS + x 4.  Extremities: No clubbing, cyanosis, u/a to assess edema as both LE are wrapped, not disturbed.  Neuro: Alert and oriented X 3. Moves all extremities spontaneously. Psych: Upset due to frequent blood draws.  LABS: CBC:  Recent Labs  09/20/12 0848 09/21/12 0440  WBC 7.6 7.7  HGB 8.1* 8.2*   HCT 26.9* 27.5*  MCV 89.7 90.8  PLT 164 170   INR:  Recent Labs  09/21/12 0440  INR 1.86*   Basic Metabolic Panel:  Recent Labs  09/81/19 0848 09/21/12 0440  NA 136 138  K 3.9 4.1  CL 97 100  CO2 25 25  GLUCOSE 142* 130*  BUN 36* 24*  CREATININE 4.33* 3.41*  CALCIUM 9.4 9.0  MG 1.9 1.9  PHOS 4.0 2.9   Liver Function Tests: Recent Labs  09/18/12 1347  09/20/12 0848 09/21/12 0440  AST 13  --   --   --   ALT <5  --   --   --   ALKPHOS 119*  --   --   --   BILITOT 3.2*  --   --   --   PROT 7.5  --   --   --   ALBUMIN 3.0*  < > 3.0* 3.0*  < > = values in this interval not displayed. Cardiac Enzymes:  Recent Labs  09/18/12 1347 09/18/12 1909 09/19/12 0200  TROPONINI <0.30 <0.30 <0.30   Anemia Panel:  Recent Labs  09/18/12 2220  VITAMINB12 728  FOLATE 12.8  FERRITIN 108  RETICCTPCT 3.2*   TELE:  SR/ST  Current Medications:  . cephALEXin  250 mg  Oral Q2200  . doxercalciferol  2 mcg Intravenous Q T,Th,Sa-HD  . febuxostat  80 mg Oral Daily  . ferric gluconate (FERRLECIT/NULECIT) IV  125 mg Intravenous Q T,Th,Sa-HD  . insulin aspart  0-9 Units Subcutaneous TID WC  . insulin aspart  2 Units Subcutaneous TID WC  . insulin glargine  19 Units Subcutaneous QHS  . metoprolol tartrate  50 mg Oral TID  . pantoprazole  40 mg Oral Daily  . simvastatin  40 mg Oral QPM  . sodium chloride  3 mL Intravenous Q12H  . Warfarin - Pharmacist Dosing Inpatient   Does not apply q1800   . sodium chloride 10 mL/hr at 09/19/12 1050  . bivalirudin (ANGIOMAX) infusion 0.5 mg/mL (Non-ACS indications) 0.025 mg/kg/hr (09/19/12 1046)    ASSESSMENT AND PLAN: Tachycardia: episodic, strips reviewed. Pt has some episodes of WCT, likely VT, others likely atrial in origin. Will contact [employer] and get device interrogated (if not done yet) continue to follow, no symptoms. On metoprolol 50 mg TID, middle dose held yesterday (due to dialysis). HR generally OK. Will change to 75 mg  BID and follow.   Volume management and other problems per IM, Renal team, WOC, orthopedics, hematology Principal Problem:   CKD (chronic kidney disease) stage 4, GFR 15-29 ml/min Active Problems:   Type II or unspecified type diabetes mellitus with renal manifestations, uncontrolled(250.42)   DYSLIPIDEMIA   Hypopotassemia   Morbid obesity   ANXIETY   TOBACCO ABUSE   OBSTRUCTIVE SLEEP APNEA   HYPERTENSION   CARDIOMYOPATHY   Chronic systolic heart failure   GERD   Gastric ulcer   Leg swelling   Knee pain   ICD (implantable cardioverter-defibrillator), single, in situ; [employer]   Normocytic anemia   Open wound of foot   End stage renal disease   Signed, Theodore Demark , PA-C 7:31 AM 09/21/2012 Patient examined and agree except changes made.  Valera Castle, MD 09/21/2012 7:53 AM Patient examined and agree except changes made. Will interrogate device.  Valera Castle, MD 09/21/2012 7:53 AM

## 2012-09-21 NOTE — Progress Notes (Signed)
ANTICOAGULATION CONSULT NOTE - Follow-Up Consult  Pharmacy Consult for  Bivalirudin & Coumadin Indication: R/O HIT; afib  Allergies  Allergen Reactions  . Ace Inhibitors     Acute renal failure with multiple trials  . Heparin     HIT panel pending 8/22    Patient Measurements: Height: 6\' 1"  (185.4 cm) Weight: 338 lb 3 oz (153.4 kg) IBW/kg (Calculated) : 79.9   Vital Signs: Temp: 98.9 F (37.2 C) (08/26 0733) Temp src: Oral (08/26 0733) BP: 108/65 mmHg (08/26 0340)  Labs:  Recent Labs  09/18/12 1347  09/18/12 1909  09/19/12 0200  09/19/12 1212 09/20/12 0848 09/21/12 0440  HGB  --   --   --   < > 8.6*  --   --  8.1* 8.2*  HCT  --   --   --   < > 28.4*  --   --  26.9* 27.5*  PLT  --   --   --   < > 140*  --   --  164 170  APTT  --   < >  --   < >  --   < > 77* 78* 79*  LABPROT  --   --   --   --   --   --   --  22.7* 20.9*  INR  --   --   --   --   --   --   --  2.08* 1.86*  CREATININE 4.26*  --   --   --  2.84*  --   --  4.33* 3.41*  TROPONINI <0.30  --  <0.30  --  <0.30  --   --   --   --   < > = values in this interval not displayed.  Estimated Creatinine Clearance: 41 ml/min (by C-G formula based on Cr of 3.41).  Assessment: 47 yo male with complicated PMH including ESRD, new - onset afib and heart failure. Was on IV heparin and platelets decreased >50%-Pharmacy asked to begin argatroban.  8/23 patient experienced VT episodes, QT noted to be prolonged.  Pharmacy consulted asked to switch to bivalirudin in light of the rare incidence of argatroban causing VT and the patients notably elevated aPTT despite multiple aggressive rate decreases.   APTT today= 79-therapeutic, platelet count= 170 (trend up), dopplers negative for DVT, INR this am was 1.86-subtherapeutic (decrease from 2.08 on 8/25; noted 1.31 on 8/22), no s/s of bleeds noted.  Goal of Therapy:  aPTT 50-90 seconds Monitor platelets by anticoagulation protocol: Yes -Anticipate false INR elevation due to  bivalirudin. The goal INR while the patient is on coumadin and bivalirudin is probably about 3-3.5.     Plan:  -Continue bivalirudin at 0.025 mcg/kg/hr -daily aPTT -Increase Coumadin to 7.5mg  po today x1 at 1800 -Daily PT/INR, monitor h/h, plt, s/s of bleeds   Anabel Bene, PharmD Clinical Pharmacist Pager: [phone]

## 2012-09-21 NOTE — Progress Notes (Signed)
Fluid overload secondary to end stage renal disease & CHF   will get HD outpatient at [facility]  Plan will do HD today again for volume but no plans for daily due to hemodynamic intolerance on HD. Will do 4 hours. Will plan 3 days per week treatments  Subjective: Interval History: [name]/o DOE  Objective: Vital signs in last 24 hours: Temp:  [97.4 F (36.3 [name])-98.9 F (37.2 [name])] 97.4 F (36.3 [name]) (08/26 1140) Pulse Rate:  [64-140] 75 (08/25 1947) Resp:  [18-26] 20 (08/26 1140) BP: (83-138)/(48-65) 138/65 mmHg (08/26 1140) SpO2:  [94 %-100 %] 100 % (08/26 1140) Weight:  [153.4 kg (338 lb 3 oz)] 153.4 kg (338 lb 3 oz) (08/26 0340) Weight change: 0.8 kg (1 lb 12.2 oz)  Intake/Output from previous day: 08/25 0701 - 08/26 0700 In: 743.8 [P.O.:250; I.V.:493.8] Out: 3200  Intake/Output this shift: Total I/O In: 103.5 [I.V.:103.5] Out: -   General appearance: alert and cooperative Resp: diminished breath sounds bilaterally Cardio: regular rate and rhythm, S1, S2 normal, no murmur, click, rub or gallop Extremities: edema 3+ with chronic changes  Lab Results:  Recent Labs  09/20/12 0848 09/21/12 0440  WBC 7.6 7.7  HGB 8.1* 8.2*  HCT 26.9* 27.5*  PLT 164 170   BMET:  Recent Labs  09/20/12 0848 09/21/12 0440  NA 136 138  K 3.9 4.1  CL 97 100  CO2 25 25  GLUCOSE 142* 130*  BUN 36* 24*  CREATININE 4.33* 3.41*  CALCIUM 9.4 9.0   No results found for this basename: PTH,  in the last 72 hours Iron Studies:  Recent Labs  09/18/12 2220  FERRITIN 108   Studies/Results: No results found.  Scheduled: . cephALEXin  250 mg Oral Q2200  . darbepoetin (ARANESP) injection - DIALYSIS  60 mcg Intravenous Q Tue-HD  . doxercalciferol  2 mcg Intravenous Q T,Th,Sa-HD  . febuxostat  80 mg Oral Daily  . ferric gluconate (FERRLECIT/NULECIT) IV  125 mg Intravenous Q T,Th,Sa-HD  . insulin aspart  0-9 Units Subcutaneous TID WC  . insulin aspart  2 Units Subcutaneous TID WC  .  insulin glargine  19 Units Subcutaneous QHS  . metoprolol succinate  50 mg Oral BID  . pantoprazole  40 mg Oral Daily  . simvastatin  40 mg Oral QPM  . sodium chloride  3 mL Intravenous Q12H  . warfarin  7.5 mg Oral ONCE-1800  . Warfarin - Pharmacist Dosing Inpatient   Does not apply q1800     LOS: 12 days   Adam [name] 09/21/2012,1:14 PM

## 2012-09-21 NOTE — Consult Note (Signed)
Advanced Heart Failure Consult Note  Patient ID: Adam [name] MRN: [id number] DOB/AGE: Dec 20, 1965 47 y.o.  Admit date: 09/09/2012 Primary Cardiologist: Dr. Tenny Craw Reason for Consultation: CHF/cardiomyopathy  HPI: 47 yo with HTN, DM, CKD, and nonischemic cardiomyopathy known since at least 2006 was admitted with dyspnea and severe volume overload.  He had been peri-dialysis and was begun on HD this admission.  In 2006, echo showed EF 25% and he had LHC showing normal coronaries.  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  Most recent echo in 12/13 also showed EF 25%.  Etiology of cardiomyopathy is uncertain: he was never a heavy drinker, no drugs.  Prior to admission, he thinks that he gained about 50 lbs.  He was not urinating much.  Creatinine had progressed to > 6, so HD was begun.  He has been getting fluid removal via daily HD, weights vary a lot but in general he seems to be down about 12 lbs currently.  He will be going for HD later today.    Since hospitalization, he has been noted to have short runs of atrial fibrillation/flutter as well as NSVT.  ICD was interrogated today, no detected VT or VF events recently.  He currently is in NSR.  I looked back at his old ECGs since admission, all show NSR.  I cannot confirm the atrial fibrillation but it was seen apparently multiple times earlier in his stay.  Telemetry overnight showed a couple of short NSVT runs.  He was started on metoprolol and initially heparin gtt.  Platelets fell on heparin so he was switched to argatroban with concern for HIT.  He had increased NSVT on argatroban, so this was stopped and he was begun on bivalirudin, which he continues.  He was started on coumadin yesterday.  Awaiting HIT panel.   Patient states that his breathing is getting better but he knows he still has a lot of fluid to get off.   Review of systems complete and found to be negative unless listed above in HPI  Past Medical History: 1. Nonischemic cardiomyopathy: EF noted to be  25% as far back as 2006.  LHC 2006 with normal coronaries.  Last echo (12/13) with EF 25%, mild LVH, moderately to severely dilated RV with moderate to severe RV dilation, moderate TR, mild MR. Has [employer] ICD.  2. Morbid obesity 3. OSA 4. HTN: Though recently BP has run low. 5. Type II diabetes 6. ESRD: Thought to be primarily secondary to DM and HTN, though cardiorenal syndrome may have played a role.  HD started this admission.  7. Atrial fibrillation/flutter: Paroxysmal. 8. Gout  9. GERD 10. Smoking: Quit 8/14.   Family History  Problem Relation Age of Onset  . Diabetes Mother   . Microcephaly Father   . Lung cancer Father   Mother with ESRD Father died at 26, ?cause  History   Social History  . Marital Status: Single    Spouse Name: N/A    Number of Children: N/A  . Years of Education: N/A   Occupational History  . Not on file.   Social History Main Topics  . Smoking status: Current Every Day Smoker -- 0.50 packs/day for .5 years    Types: Cigarettes  . Smokeless tobacco: Never Used     Comment: started smoking at age 7.  Quit smoking the week before admission.   . Alcohol Use: No     Comment: No alcohol x 1 month.  . Drug Use: No  .  Sexual Activity: Not on file   Other Topics Concern  . Not on file   Social History Narrative  . Unemployed, lives alone   Current Medications . cephALEXin  250 mg Oral Q2200  . darbepoetin (ARANESP) injection - DIALYSIS  60 mcg Intravenous Q Tue-HD  . doxercalciferol  2 mcg Intravenous Q T,Th,Sa-HD  . febuxostat  80 mg Oral Daily  . ferric gluconate (FERRLECIT/NULECIT) IV  125 mg Intravenous Q T,Th,Sa-HD  . insulin aspart  0-9 Units Subcutaneous TID WC  . insulin aspart  2 Units Subcutaneous TID WC  . insulin glargine  19 Units Subcutaneous QHS  . metoprolol tartrate  75 mg Oral BID  . pantoprazole  40 mg Oral Daily  . simvastatin  40 mg Oral QPM  . sodium chloride  3 mL Intravenous Q12H  . warfarin  7.5 mg Oral ONCE-1800   . Warfarin - Pharmacist Dosing Inpatient   Does not apply q1800  bivalirudin gtt   Prescriptions prior to admission  Medication Sig Dispense Refill  . acetaminophen (TYLENOL) 325 MG tablet Take 650 mg by mouth every 6 (six) hours as needed for pain.      Marland Kitchen colchicine 0.6 MG tablet Take 0.6 mg by mouth See admin instructions. Takes every three days.      . diclofenac sodium (VOLTAREN) 1 % GEL Apply 2 g topically 4 (four) times daily.       Marland Kitchen docusate sodium (COLACE) 100 MG capsule Take 100 mg by mouth as needed for constipation.       Marland Kitchen esomeprazole (NEXIUM) 20 MG capsule Take 1 capsule (20 mg total) by mouth daily before breakfast.  30 capsule  11  . Febuxostat (ULORIC) 80 MG TABS Take 1 tablet by mouth daily.      . fluticasone (FLONASE) 50 MCG/ACT nasal spray Place 1 spray into the nose daily as needed for allergies. For allergies      . insulin glargine (LANTUS) 100 UNIT/ML injection Inject 62 Units into the skin at bedtime.      Marland Kitchen loratadine (CLARITIN) 10 MG tablet Take 10 mg by mouth daily.      . metoprolol (LOPRESSOR) 100 MG tablet Take 100 mg by mouth every morning.       . simvastatin (ZOCOR) 40 MG tablet Take 40 mg by mouth every evening.      . traMADol (ULTRAM-ER) 100 MG 24 hr tablet Take 100 mg by mouth every 6 (six) hours as needed for pain.      Marland Kitchen oxyCODONE-acetaminophen (ROXICET) 5-325 MG per tablet Take 1-2 tablets by mouth every 4 (four) hours as needed for pain.  20 tablet  0    Physical exam Blood pressure 108/65, pulse 75, temperature 98.9 F (37.2 C), temperature source Oral, resp. rate 18, height 6\' 1"  (1.854 m), weight 153.4 kg (338 lb 3 oz), SpO2 100.00%. General: NAD, obese Neck: Thick, JVP 16 cm, no thyromegaly or thyroid nodule.  Lungs: Slightly decreased breath sounds at bases.  CV: Heart regular S1/S2, no S3/S4, no murmur.  Legs wrapped with 1-2+ edema to knees bilaterally.  No carotid bruit.  Unable to feel pedal pulses (ankles wrapped).   Abdomen: Soft,  nontender, no hepatosplenomegaly, no distention.  Skin: Intact without lesions or rashes.  Neurologic: Alert and oriented x 3.  Psych: Normal affect. Extremities: No clubbing or cyanosis.  HEENT: Normal.   Labs:   Lab Results  Component Value Date   WBC 7.7 09/21/2012   HGB 8.2* 09/21/2012  HCT 27.5* 09/21/2012   MCV 90.8 09/21/2012   PLT 170 09/21/2012    Recent Labs Lab 09/18/12 1347  09/21/12 0440  NA 134*  < > 138  K 3.9  < > 4.1  CL 97  < > 100  CO2 24  < > 25  BUN 46*  < > 24*  CREATININE 4.26*  < > 3.41*  CALCIUM 9.0  < > 9.0  PROT 7.5  --   --   BILITOT 3.2*  --   --   ALKPHOS 119*  --   --   ALT <5  --   --   AST 13  --   --   GLUCOSE 178*  < > 130*  < > = values in this interval not displayed. Lab Results  Component Value Date   TROPONINI <0.30 09/19/2012    Lab Results  Component Value Date   CHOL 60 04/15/2012   CHOL 100 02/20/2011   CHOL 74 05/28/2009   Lab Results  Component Value Date   HDL 26* 04/15/2012   HDL 29* 02/20/2011   HDL 20.20* 05/28/2009   Lab Results  Component Value Date   LDLCALC 24 04/15/2012   LDLCALC 56 02/20/2011   LDLCALC 43 05/28/2009   Lab Results  Component Value Date   TRIG 49 04/15/2012   TRIG 77 02/20/2011   TRIG 53.0 05/28/2009   Lab Results  Component Value Date   CHOLHDL 2.3 04/15/2012   CHOLHDL 3.4 02/20/2011   CHOLHDL 4 05/28/2009   No results found for this basename: LDLDIRECT     EKG: NSR with poor anterior R wave progression on 8/23  ASSESSMENT AND PLAN:  47 yo with HTN/DM, nonischemic cardiomyopathy, and baseline severe CKD was admitted with acute on chronic systolic CHF and progression to ESRD.  1. CHF: Acute on chronic systolic.  Nonischemic cardiomyopathy with last EF 25%.  Etiology uncertain: No ETOH or drug abuse, normal coronaries in 2006 (after CMP identified).  Cannot rule out familial CMP, father died at 32 of uncertain cause.  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  He is still massively volume overloaded.  Blood pressure is  soft, with SBP in 100s.  This will limit medication titration, especially as he will be getting dialysis.   - Needs ongoing daily HD for fluid removal.   - Transition metoprolol to Toprol XL.  - For now, hold off on ACEI, Bidil while we see how his pressure does.  2. Atrial arrhythmias: Atrial fibrillation and possibly flutter were noted paroxysmally earlier in hospital stay.  He is now in NSR.  As above, I will transition him to Toprol XL.  He will continue bivalirudin overlap to coumadin per pharmacy.  Follow closely, do not see need for amiodarone at the moment, hopefully drive for atrial fibrillation will subside as fluid comes off.  3. ESRD: Per renal.  Needs ongoing fluid removal.  4. NSVT: Follow, no prolonged events by ICD interrogation.    Marca Ancona 09/21/2012 11:13 AM

## 2012-09-22 LAB — RENAL FUNCTION PANEL
Albumin: 2.9 g/dL — ABNORMAL LOW (ref 3.5–5.2)
BUN: 18 mg/dL (ref 6–23)
Creatinine, Ser: 3.08 mg/dL — ABNORMAL HIGH (ref 0.50–1.35)
Glucose, Bld: 86 mg/dL (ref 70–99)
Phosphorus: 2.6 mg/dL (ref 2.3–4.6)

## 2012-09-22 LAB — PROTIME-INR: Prothrombin Time: 21.9 seconds — ABNORMAL HIGH (ref 11.6–15.2)

## 2012-09-22 LAB — GLUCOSE, CAPILLARY: Glucose-Capillary: 107 mg/dL — ABNORMAL HIGH (ref 70–99)

## 2012-09-22 LAB — APTT: aPTT: 88 seconds — ABNORMAL HIGH (ref 24–37)

## 2012-09-22 LAB — HEPARIN INDUCED THROMBOCYTOPENIA PNL
Patient O.D.: 2.694
UFH SRA Result: NEGATIVE

## 2012-09-22 LAB — CBC
HCT: 26.2 % — ABNORMAL LOW (ref 39.0–52.0)
Hemoglobin: 7.8 g/dL — ABNORMAL LOW (ref 13.0–17.0)
RBC: 2.87 MIL/uL — ABNORMAL LOW (ref 4.22–5.81)
WBC: 7.6 10*3/uL (ref 4.0–10.5)

## 2012-09-22 MED ORDER — SODIUM CHLORIDE 0.9 % IV SOLN
0.0200 mg/kg/h | INTRAVENOUS | Status: DC
  Filled 2012-09-22: qty 250

## 2012-09-22 MED ORDER — RENA-VITE PO TABS
1.0000 | ORAL_TABLET | Freq: Every day | ORAL | Status: DC
  Administered 2012-09-22 – 2012-09-26 (×5): 1 via ORAL
  Filled 2012-09-22 (×6): qty 1

## 2012-09-22 MED ORDER — WARFARIN SODIUM 7.5 MG PO TABS
7.5000 mg | ORAL_TABLET | Freq: Once | ORAL | Status: AC
  Administered 2012-09-22: 7.5 mg via ORAL
  Filled 2012-09-22: qty 1

## 2012-09-22 NOTE — Progress Notes (Signed)
Patient continues to refuse CPAP machine at night time. Patient has been informed to let RN or RT know if he changes his mind about using the machine. RT will continue to assist as needed.

## 2012-09-22 NOTE — Consult Note (Signed)
WOC contacted, pt complaining to bedside nurse compression wrap on one leg is burning and tight at ankle when he dorsiflexes foot. May be due to reduction in the edema and the bulkiness of the dressing. I have instructed bedside nurse in mechanism to loosen at the ankle. If this does not relieve issues, to be removed completely per bedside nurse and WOC will re-evaluate pt in am.  Armen Pickup RN,CWOCN [phone]

## 2012-09-22 NOTE — Progress Notes (Signed)
  Date: 09/22/2012  Patient name: Adam [name]  Medical record number: [id number]  Date of birth: Feb 25, 1965   This patient has been seen and the plan of care was discussed with the house staff. Please see their note for complete details. I concur with their findings with the following additions/corrections:  Update from Dr. Thornell Sartorius note, HIT antibody +.  Continue bivalirudin until INR therapeutic, per last pharmacy note >/= 2.5.  Monitor daily.  If he can have bivalirudin drip on floor, stable for transfer to tele bed.   Inez Catalina, MD 09/22/2012, 8:02 PM

## 2012-09-22 NOTE — Progress Notes (Signed)
Occupational Therapy Evaluation Patient Details Name: Adam [name] MRN: [id number] DOB: March 07, 1965 Today's Date: 09/22/2012 Time: 4098-1191 OT Time Calculation (min): 42 min  OT Assessment / Plan / Recommendation History of present illness Pt adm to [facility] due to 50lb weight gain in less than a month, pt admits to increased SOB recenty and Lt LE pain/swelling from fluid retention. Pt with unctrolled diabetes and cardiomyopathy.     Clinical Impression   PTA, pt lived in apt alone/independent with ADL and mobility. Pt will benefit from AE for LB ADL and DME  as listed below to increase safety and independence at home. Pt will benefit from skilled OT services to facilitate D/C to next venue due to below deficits.    OT Assessment  Patient needs continued OT Services    Follow Up Recommendations  Home health OT    Barriers to Discharge      Equipment Recommendations  3 in 1 bedside comode;Other (comment);Tub/shower bench (needs wide BSC and RW for over 300#. Needs [employer] tub bench)    Recommendations for Other Services    Frequency  Min 2X/week    Precautions / Restrictions Precautions Precautions: None Restrictions Weight Bearing Restrictions: No   Pertinent Vitals/Pain no apparent distress     ADL  Upper Body Bathing: Set up Where Assessed - Upper Body Bathing: Unsupported sitting Lower Body Bathing: Minimal assistance Where Assessed - Lower Body Bathing: Unsupported sit to stand Upper Body Dressing: Set up Where Assessed - Upper Body Dressing: Unsupported sitting Lower Body Dressing: Minimal assistance Where Assessed - Lower Body Dressing: Unsupported sit to stand Toilet Transfer: Supervision/safety Toilet Transfer Method: Other (comment) (ambulating) Acupuncturist: Extra wide bedside commode Toileting - Clothing Manipulation and Hygiene: Modified independent Where Assessed - Toileting Clothing Manipulation and Hygiene: Sit to stand from 3-in-1 or  toilet Transfers/Ambulation Related to ADLs: S ADL Comments: would benefit from AE    OT Diagnosis: Generalized weakness  OT Problem List: Decreased strength;Decreased activity tolerance;Decreased knowledge of use of DME or AE;Cardiopulmonary status limiting activity;Obesity;Increased edema OT Treatment Interventions: Self-care/ADL training;Therapeutic exercise;Energy conservation;DME and/or AE instruction;Therapeutic activities;Patient/family education   OT Goals(Current goals can be found in the care plan section) Acute Rehab OT Goals Patient Stated Goal: decrease swelling OT Goal Formulation: With patient Time For Goal Achievement: 10/06/12 Potential to Achieve Goals: Good  Visit Information  Last OT Received On: 09/22/12 Assistance Needed: +1 History of Present Illness: Pt adm to [facility] due to 50lb weight gain in less than a month, pt admits to increased SOB recenty and Lt LE pain/swelling from fluid retention. Pt with unctrolled diabetes and cardiomyopathy.         Prior Functioning     Home Living Family/patient expects to be discharged to:: Private residence Living Arrangements: Alone Available Help at Discharge: Family;Friend(s);Available 24 hours/day (girlfriend is available to stay with him as needed ) Type of Home: Apartment Home Access: Level entry Home Layout: One level Home Equipment: Walker - 2 wheels;Cane - single point Additional Comments: pt has neighbor bring groceries  Prior Function Level of Independence: Independent with assistive device(s) Comments: used a can PRN due to gout Communication Communication: No difficulties Dominant Hand: Right         Vision/Perception Vision - History Baseline Vision: No visual deficits   Cognition  Cognition Arousal/Alertness: Awake/alert Behavior During Therapy: WFL for tasks assessed/performed Overall Cognitive Status: Within Functional Limits for tasks assessed    Extremity/Trunk Assessment Upper Extremity  Assessment Upper Extremity Assessment: Overall  WFL for tasks assessed Lower Extremity Assessment Lower Extremity Assessment: Defer to PT evaluation LLE Deficits / Details: unable to fully assess Lt LE due to pain LLE: Unable to fully assess due to pain LLE Sensation:  (co R foot pain today) Cervical / Trunk Assessment Cervical / Trunk Assessment: Normal     Mobility Bed Mobility Bed Mobility: Supine to Sit;Sitting - Scoot to Edge of Bed Supine to Sit: 6: Modified independent (Device/Increase time) Sitting - Scoot to Edge of Bed: 6: Modified independent (Device/Increase time) Transfers Sit to Stand: From bed;With upper extremity assist;5: Supervision Stand to Sit: 5: Supervision;To bed;With upper extremity assist Details for Transfer Assistance: v/c's for hand placement     Exercise     Balance Balance Balance Assessed: Yes Static Sitting Balance Static Sitting - Balance Support: No upper extremity supported;Feet supported Static Sitting - Level of Assistance: 5: Stand by assistance   End of Session OT - End of Session Equipment Utilized During Treatment: Gait belt;Rolling walker Activity Tolerance: Patient tolerated treatment well Patient left: in chair;with call bell/phone within reach;with family/visitor present Nurse Communication: Mobility status  GO     Adam [name],Adam [name] 09/22/2012, 4:02 PM [facility], OTR/L  [phone] 09/22/2012

## 2012-09-22 NOTE — Progress Notes (Signed)
Patient ID: Adam [name], male   DOB: 1965/03/27, 47 y.o.   MRN: [id number] Advanced Heart Failure Rounding Note  SUBJECTIVE: HD yesterday, HR increased significantly to around 150 (? Atrial fibrillation).  He has been in NSR on telemetry in the CCU.  BP is soft.  He got about 3 hrs of HD yesterday with net negative I/Os and decreased weight post-dialysis.   . cephALEXin  250 mg Oral Q2200  . doxercalciferol  2 mcg Intravenous Q T,Th,Sa-HD  . febuxostat  80 mg Oral Daily  . ferric gluconate (FERRLECIT/NULECIT) IV  125 mg Intravenous Q T,Th,Sa-HD  . insulin aspart  0-9 Units Subcutaneous TID WC  . insulin aspart  2 Units Subcutaneous TID WC  . insulin glargine  19 Units Subcutaneous QHS  . metoprolol succinate  50 mg Oral BID  . pantoprazole  40 mg Oral Daily  . simvastatin  40 mg Oral QPM  . sodium chloride  3 mL Intravenous Q12H  . Warfarin - Pharmacist Dosing Inpatient   Does not apply q1800  bivalirudin gtt   Filed Vitals:   09/22/12 0400 09/22/12 0500 09/22/12 0526 09/22/12 0556  BP:   100/56   Pulse:      Temp:   99.1 F (37.3 C)   TempSrc:   Oral   Resp: 17  12   Height:      Weight:  151.7 kg (334 lb 7 oz)  151.7 kg (334 lb 7 oz)  SpO2:   95%     Intake/Output Summary (Last 24 hours) at 09/22/12 0748 Last data filed at 09/22/12 0600  Gross per 24 hour  Intake  476.1 ml  Output   2716 ml  Net -2239.9 ml    LABS: Basic Metabolic Panel:  Recent Labs  40/98/11 0440 09/22/12 0400  NA 138 137  K 4.1 3.4*  CL 100 98  CO2 25 26  GLUCOSE 130* 86  BUN 24* 18  CREATININE 3.41* 3.08*  CALCIUM 9.0 8.9  MG 1.9 1.8  PHOS 2.9 2.6   Liver Function Tests:  Recent Labs  09/21/12 0440 09/22/12 0400  ALBUMIN 3.0* 2.9*   No results found for this basename: LIPASE, AMYLASE,  in the last 72 hours CBC:  Recent Labs  09/21/12 0440 09/22/12 0400  WBC 7.7 7.6  HGB 8.2* 7.8*  HCT 27.5* 26.2*  MCV 90.8 91.3  PLT 170 180   Cardiac Enzymes: No results found for  this basename: CKTOTAL, CKMB, CKMBINDEX, TROPONINI,  in the last 72 hours BNP: No components found with this basename: POCBNP,  D-Dimer: No results found for this basename: DDIMER,  in the last 72 hours Hemoglobin A1C: No results found for this basename: HGBA1C,  in the last 72 hours Fasting Lipid Panel: No results found for this basename: CHOL, HDL, LDLCALC, TRIG, CHOLHDL, LDLDIRECT,  in the last 72 hours Thyroid Function Tests: No results found for this basename: TSH, T4TOTAL, FREET3, T3FREE, THYROIDAB,  in the last 72 hours Anemia Panel: No results found for this basename: VITAMINB12, FOLATE, FERRITIN, TIBC, IRON, RETICCTPCT,  in the last 72 hours  RADIOLOGY: Dg Chest 2 View  08/26/2012   *RADIOLOGY REPORT*  Clinical Data: Preop for dialysis catheter insertion  CHEST - 2 VIEW  Comparison: 10/28/2011  Findings: Cardiomegaly again noted.  Single lead cardiac pacemaker is unchanged in position.  No acute infiltrate or pleural effusion. No pulmonary edema.  Bony thorax is stable.  IMPRESSION:  No active disease.  Cardiomegaly again noted.  Single lead cardiac pacemaker in place.   Original Report Authenticated By: Natasha Mead, M.D.   Dg Chest Port 1 View  09/10/2012   *RADIOLOGY REPORT*  Clinical Data: Lower extremity swelling.  Weakness and dizziness.  PORTABLE CHEST - 1 VIEW  Comparison: 08/31/2012.  Findings: The pacer wire / AICD is stable.  The dialysis catheter is unchanged.  The heart is enlarged and there is vascular congestion and pulmonary edema.  No definite pleural effusion.  IMPRESSION: Cardiac enlargement and pulmonary edema suggesting CHF.  No definite pleural effusion.   Original Report Authenticated By: Rudie Meyer, M.D.   Dg Chest Port 1 View  08/31/2012   *RADIOLOGY REPORT*  Clinical Data: Status post hemodialysis catheter placement  PORTABLE CHEST - 1 VIEW  Comparison: Prior chest x-ray 08/26/2012  Findings: Interval placement of a right IJ approach tunneled hemodialysis  catheter.  The catheter tips project over the superior cavoatrial junction.  No evidence of pneumothorax.  Enlargement the cardiopericardial silhouette with pulmonary vascular congestion and mild interstitial edema.  Stable left subclavian approach cardiac rhythm maintenance device with the single lead projecting over the right ventricle.  No acute osseous abnormality.  IMPRESSION:  1.  Right IJ approach tunneled hemodialysis catheter with the tips projecting over the superior cavoatrial junction. 2.  Cardiomegaly with mild CHF versus volume overload.   Original Report Authenticated By: Malachy Moan, M.D.   Dg Foot Complete Left  09/12/2012   *RADIOLOGY REPORT*  Clinical Data: Left toe infection, left dorsal foot ulcer for 1 month, rule out osteomyelitis left big toe  LEFT FOOT - COMPLETE 3+ VIEW  Comparison: None.  Findings: Extensive small vessel calcification.  No fracture dislocation.  No periosteal reaction or cortical destruction.  IMPRESSION: No evidence for osteomyelitis.   Original Report Authenticated By: Esperanza Heir, M.D.   Dg Fluoro Guide Cv Line-no Report  08/31/2012   CLINICAL DATA: Dialysis Catheter Placement   FLOURO GUIDE CV LINE  Fluoroscopy was utilized by the requesting physician.  No radiographic  interpretation.     PHYSICAL EXAM General: NAD Neck: JVP 14+ cm, no thyromegaly or thyroid nodule.  Lungs: Clear to auscultation bilaterally with normal respiratory effort. CV: Nondisplaced PMI.  Heart regular S1/S2, no S3/S4, no murmur.  Legs wrapped with 1+ edema to knees.   Abdomen: Soft, nontender, no hepatosplenomegaly, no distention.  Neurologic: Alert and oriented x 3.  Psych: Normal affect. Extremities: No clubbing or cyanosis.   TELEMETRY: Reviewed telemetry pt in NSR  ASSESSMENT AND PLAN: 47 yo with HTN/DM, nonischemic cardiomyopathy, and baseline severe CKD was admitted with acute on chronic systolic CHF and progression to ESRD.  1. CHF: Acute on chronic  systolic. Nonischemic cardiomyopathy with last EF 25%. Etiology uncertain: No ETOH or drug abuse, normal coronaries in 2006 (after CMP identified). Cannot rule out familial CMP, father died at 93 of uncertain cause. 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 He remains volume overloaded. Blood pressure is soft, with SBP in 100s. This will limit medication titration, especially as he will be getting dialysis.  - Ongoing HD for fluid removal.  HD limited by BP/HR, will be getting 3 times a week at this point.  He has a lot of volume but removal is going to have to be gradual. - Continue Toprol XL.  - For now, hold off on ACEI, Bidil given lack of BP room.  2. Atrial arrhythmias: Atrial fibrillation and possibly flutter were noted paroxysmally earlier in hospital stay. He is now in NSR (telemetry  not hooked up currently). He is on Toprol XL. He will continue bivalirudin overlap to coumadin per pharmacy. Follow closely, do not see need for amiodarone at the moment, hopefully drive for atrial fibrillation will subside as fluid comes off.  3. ESRD: Per renal. Needs ongoing fluid removal.  4. NSVT: Follow, no prolonged events by ICD interrogation.  5. 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  If positive, he has not had a thrombotic event so can overlap bivalirudin/coumadin until INR therapeutic (do not have to wait to INR >3 per my discussion with pharmacist this morning, that is for overlap with argatroban).   Marca Ancona 09/22/2012

## 2012-09-22 NOTE — Progress Notes (Signed)
PT Cancellation Note  Patient Details Name: ELIAKIM TENDLER MRN: [id number] DOB: February 14, 1965   Cancelled Treatment:    Reason Eval/Treat Not Completed:  (pt deferred due to just finished with OT and many family visitors present at this time. PT to return as able.   Marcene Brawn 09/22/2012, 4:39 PM

## 2012-09-22 NOTE — Progress Notes (Addendum)
Subjective:  Pt seen and examined. No acute events overnight. Pt doing well. No CP, dyspnea, or palpitations. No nausea, vomiting, or abdominal pain. Normal BM and urination. Tolerated HD well yesterday with episode of tachycardia that resolved.     Objective: Vital signs in last 24 hours: Filed Vitals:   09/22/12 0556 09/22/12 0752 09/22/12 0755 09/22/12 0903  BP:  87/53 109/57 108/57  Pulse:  79  77  Temp:  98.5 F (36.9 C)    TempSrc:  Oral    Resp:      Height:      Weight: 151.7 kg (334 lb 7 oz)     SpO2:  92%     Weight change: -8.2 kg (-18 lb 1.3 oz)  Intake/Output Summary (Last 24 hours) at 09/22/12 1052 Last data filed at 09/22/12 0600  Gross per 24 hour  Intake    414 ml  Output   2716 ml  Net  -2302 ml   Constitutional: Obese, NAD Head: Normocephalic Eyes: PERRL Neck: Supple Cardiovascular:  RRR, S1 normal, S2 normal, pitting +2 edema b/l from thighs to abdomen.  Pulmonary/Chest: Air entry equal bilaterally. Clear to auscultation bilaterally Abdominal: Soft. Non-tender, non-distended, bowel sounds are normal, no masses, organomegaly, or guarding present.  Musculoskeletal: No synovitis, effusions, or erythema of bilateral upper and lower  appendicular joints  Neurological: A&O x3 Skin: both LE wrapped    Psych: depressed mood    Lab Results: Basic Metabolic Panel:  Recent Labs Lab 09/21/12 0440 09/22/12 0400  NA 138 137  K 4.1 3.4*  CL 100 98  CO2 25 26  GLUCOSE 130* 86  BUN 24* 18  CREATININE 3.41* 3.08*  CALCIUM 9.0 8.9  MG 1.9 1.8  PHOS 2.9 2.6   Liver Function Tests:  Recent Labs Lab 09/18/12 1347  09/21/12 0440 09/22/12 0400  AST 13  --   --   --   ALT <5  --   --   --   ALKPHOS 119*  --   --   --   BILITOT 3.2*  --   --   --   PROT 7.5  --   --   --   ALBUMIN 3.0*  < > 3.0* 2.9*  < > = values in this interval not displayed. No results found for this basename: LIPASE, AMYLASE,  in the last 168 hours No results found for  this basename: AMMONIA,  in the last 168 hours CBC:  Recent Labs Lab 09/21/12 0440 09/22/12 0400  WBC 7.7 7.6  HGB 8.2* 7.8*  HCT 27.5* 26.2*  MCV 90.8 91.3  PLT 170 180   Cardiac Enzymes:  Recent Labs Lab 09/18/12 1347 09/18/12 1909 09/19/12 0200  TROPONINI <0.30 <0.30 <0.30   BNP: No results found for this basename: PROBNP,  in the last 168 hours D-Dimer: No results found for this basename: DDIMER,  in the last 168 hours CBG:  Recent Labs Lab 09/20/12 2131 09/21/12 0237 09/21/12 0731 09/21/12 1144 09/21/12 2137 09/22/12 0750  GLUCAP 132* 144* 112* 107* 152* 85   Hemoglobin A1C: No results found for this basename: HGBA1C,  in the last 168 hours Fasting Lipid Panel: No results found for this basename: CHOL, HDL, LDLCALC, TRIG, CHOLHDL, LDLDIRECT,  in the last 168 hours Thyroid Function Tests: No results found for this basename: TSH, T4TOTAL, FREET4, T3FREE, THYROIDAB,  in the last 168 hours Coagulation:  Recent Labs Lab 09/17/12 0640 09/20/12 0848 09/21/12 0440 09/22/12 0400  LABPROT 16.0* 22.7* 20.9* 21.9*  INR 1.31 2.08* 1.86* 1.98*   Anemia Panel:  Recent Labs Lab 09/18/12 2220  VITAMINB12 728  FOLATE 12.8  FERRITIN 108  RETICCTPCT 3.2*   Urine Drug Screen: Drugs of Abuse     Component Value Date/Time   LABOPIA NEG 03/17/2006 1455   COCAINSCRNUR NEG 03/17/2006 1455   LABBENZ NEG 03/17/2006 1455   AMPHETMU NEG 03/17/2006 1455    Alcohol Level: No results found for this basename: ETH,  in the last 168 hours Urinalysis: No results found for this basename: COLORURINE, APPERANCEUR, LABSPEC, PHURINE, GLUCOSEU, HGBUR, BILIRUBINUR, KETONESUR, PROTEINUR, UROBILINOGEN, NITRITE, LEUKOCYTESUR,  in the last 168 hours Misc. Labs:   Micro Results: Recent Results (from the past 240 hour(s))  MRSA PCR SCREENING     Status: None   Collection Time    09/18/12  7:47 PM      Result Value Range Status   MRSA by PCR NEGATIVE  NEGATIVE Final   Comment:             The GeneXpert MRSA Assay (FDA     approved for NASAL specimens     only), is one component of a     comprehensive MRSA colonization     surveillance program. It is not     intended to diagnose MRSA     infection nor to guide or     monitor treatment for     MRSA infections.   Studies/Results: No results found. Medications: I have reviewed the patient's current medications. Scheduled Meds: . cephALEXin  250 mg Oral Q2200  . doxercalciferol  2 mcg Intravenous Q T,Th,Sa-HD  . febuxostat  80 mg Oral Daily  . ferric gluconate (FERRLECIT/NULECIT) IV  125 mg Intravenous Q T,Th,Sa-HD  . insulin aspart  0-9 Units Subcutaneous TID WC  . insulin aspart  2 Units Subcutaneous TID WC  . insulin glargine  19 Units Subcutaneous QHS  . metoprolol succinate  50 mg Oral BID  . pantoprazole  40 mg Oral Daily  . simvastatin  40 mg Oral QPM  . sodium chloride  3 mL Intravenous Q12H  . Warfarin - Pharmacist Dosing Inpatient   Does not apply q1800   Continuous Infusions: . sodium chloride 10 mL/hr at 09/19/12 1050  . bivalirudin (ANGIOMAX) infusion 0.5 mg/mL (Non-ACS indications) 0.025 mg/kg/hr (09/22/12 0421)   PRN Meds:.sodium chloride, sodium chloride, sodium chloride, acetaminophen, diclofenac sodium, docusate sodium, feeding supplement (NEPRO CARB STEADY), lidocaine (PF), lidocaine-prilocaine, LORazepam, pentafluoroprop-tetrafluoroeth, sodium chloride Assessment/Plan: Principal Problem:   CKD (chronic kidney disease) stage 4, GFR 15-29 ml/min Active Problems:   Type II or unspecified type diabetes mellitus with renal manifestations, uncontrolled(250.42)   DYSLIPIDEMIA   Hypopotassemia   Morbid obesity   ANXIETY   TOBACCO ABUSE   OBSTRUCTIVE SLEEP APNEA   HYPERTENSION   CARDIOMYOPATHY   Chronic systolic heart failure   GERD   Gastric ulcer   Leg swelling   Knee pain   ICD (implantable cardioverter-defibrillator), single, in situ; [employer]   Normocytic anemia   Open wound of  foot   End stage renal disease   # Atrial Fibrallation - new onset in setting of volume overload (ESRD and CHF)  -No firing of AICD - no interrogation needed per cards - Cardiology consult - rate control for now, cardioversion if needed  -Heart Failure consult - Transition metoprolol to Toprol XL 50mg  BID, no events per interrogation of device  -On coumadin 8/25 for [facility]  -12-lead EKG  8/27 - episodes of atrial tach and PAF- continue current medications    #Thrombocytopenia - resolved s/p discontinuing heparin. 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 IV Heparin started on 8/18 with thrombocytopenia on 8/19. Other etiologies include medication induced (keflex started on 8/18, thrombocytopenia on 8/19), however patient with uptrending platlet count since stopping of heparin with continuation of keflex.        -Platlet Count: 230 (8/14) to 189 (8/16)  to 180 (8/18) to 136 (8/19)  to 131 (8/20)  to 94 (8/21)  to 73 (8/22)  (50% drop in last 7 days) -STOP heparin (8/22)  -Obtain HIT panel - will result on Wed per [facility] - still pending  -Due to epsiodes of Vtach, argatraban that was started on 8/23 stopped on 8/24 due to risk of  cardiac arrest (6%), ventricular tachycardia (5%), bradycardia (5%), myocardial infarction (PCI: 4%), atrial fibrillation (3%), angina (2%)  -Angiomax per pharmacy  -On coumadin since  8/25 - 8/24 DUS of bilateral extremities to r/o DVT - no DVT or SVT in bilateral extremities  - Per cardiology - if HIT ab positive can overlap angiomax/coumadin until INR therapeutic  -Monitor for bleeding    # ESRD with severe volume overload refractory to diuretic therapy necessitating emergent HD, with right arm AV cimino fistula and perm cath  -HD 8/14 -  tolerated well, 4L off, episode of hypotension and tachycardia - asymptomatic -HD 8/16 -  3L off  -HD 8/18 -  4L off -HD 8/20 - tacyhcardia,  2.9L off -HD 8/21-  Erratic  HR, bp low 100s,  2.9L off  -HD 8/23 - 2.7L off -HD 8/25 - 3.2L -HD 8/26 - 2.7L , tachycardia 140-150's, UF was turned off -Carb restricted diet with 1.0L fluid restriction  -Continue daily weight - 363 to 334 wt loss since admission -Monitor for hyperkalemia  -PT - home health PT recommended -OT evaluation - needs home health OT, 3 in 1 bedside comode;Other (comment);Tub/shower bench (needs wide BSC and RW for over 300#. Needs [employer] tub bench -Pt to have advanced home health care -Pt to call medicaid transportation for options regarding transportation services to HD  -Per Dr. Lowell Guitar to receive HD 3x/week as outpatient -on TTS schedule, 5hr session  #Dyspnea - stable, present with exertion, not at rest.  Patient with h/o of CHF, anemia, and CKD with ongoing dyspnea for past month due to fluid overload requiring emergent hemodialysis. Also with acute on chronic systolic/ diastolic failure. Since admission, patient has had several episodes of  ventricular tachycardia with atrial fibrillation necessitating AC therapy with heparin drip on 8/18.  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 Argatraban was stopped due to episodes of tachycardia and known cardiac side effects. Instead angiomax started. VT most likely due to new onset atrial fibrillation in setting of CHF exacerbation. Doppler US of bilateral lower extremity with no evidence of clot and patient with  no leg pain. Patient with Geneva score of 3.0 points, indicating low risk for PE 7-9% incidence. Patient has been anitcoagulated since 8/18 making pulmonary embolus less likely. He is presently not dyspneic, tachypneic, or tachycardic and normal oxygen saturation on 2-3L.     -Do not recommend V/Q scan - if low/intermediate probability will not exclude PE. If normal would exclude PE but in setting of vascular congestion and pulmonary edema will most likely not be normal.        -Do not  recommend CTA chest with  contrast due to contrast induced nephropathy in setting of ERSD  -Obtain carboxyhemoglobin -elevated at 2.6,  reduced oxygen saturation (66%), normal methemoglobin     # Acute on chronic CHF with EF of 25% - Nonischemic cardiomyopathy with AICD (2011)  - CXR (8/15) --> cardiomegaly and pulmonary edema suggesting CHF, no pleural effusion   - Monitor on telemetry -Continue HD to remove excess fluid -Heart Failure consult -  Transition metoprolol to Toprol XL 50mg  BID, hold ACEi, Bidil   Left forefoot wound with paronychia infection of left great toe  - 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 Left great toe with what appears to be an infected paronychia, recommend I & D of paronychia and antibiotics -Obtain Xray left foot  - no osteomyelitis  -Obtain ABI - R 1.41 L 1.39 - suggesting calcified vessels, waveforms of b/l LE WNL -Ortho consult - Per Dr. Eulah Pont no surgical debridement at this time, continue wound care, antibiotics - keflex 250mg  daily on day 8/10     - Per Dr. Lajoyce Corners - wound care nurses to apply a Profore wrap 4 both lower extremities. To be d/c with compression wraps and f/u in office for wound care -DUS bilateral LE - no DVT or SVT in bilateral lower extremities     #DM c/b dyslipidemia - last HbA1c 9.1 on 07/27/2012  -CBG WNL today - Lantus 19U at night - Novolog 2U, SSI for meal-time coverage  - Continue home statin  - Lipid panel 03/2012 - low HDL (26)  #Normocytic Anemia - asymptomatic, most likely due to CKD, stable   -To receive weekly erythropoetin injection, target Hct 33-36% -Iron deficiency (19), TIBC WNL, Iron sat low, to receive IV iron infusion with HD  -Monitor Potassium - 3.4 on 8/14 now WNL     #Hyperphosphatemia -  Resolved, 4.8 on  8/16 - Phosphate restriction 1g/d    -Consider phosphate binder therapy with meals  #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 Nausea w/o vomiting or  abdominal pain, normal BM, likely due to HD and electrolyte/fluid shift;  started 8/15, 1 episode of green emesis on 8/16 and again 8/17 after eating   -Zofran as needed for nausea  #Somnolence - resolved. 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  Anticipated discharge in approximately  day(s).   The patient does have a current PCP Lars Masson, MD) and does need an [facility] hospital follow-up appointment after discharge.  The patient does have transportation  limitations that hinder transportation to clinic appointments.  .Services Needed at time of discharge: Y = Yes, Blank = No PT:   OT:   RN:   Equipment:   Other:     LOS: 13 days   Otis Brace, MD 09/22/2012, 10:52 AM

## 2012-09-22 NOTE — Progress Notes (Addendum)
Called by RN re: tachycardia   Pt has been going in/out of tachycardia since admission. He is asymptomatic but his SBP decreases with the episodes. They seem to be happening more frequently and lasting longer.  12-lead ECG and 12-lead rhythm strip performed and are available for review. He may have both episodes of atrial tach (primarily) and PAF. Continue current Rx. No med changes with borderline BP. MD to see in am and advise on EP eval.

## 2012-09-22 NOTE — Progress Notes (Signed)
Fluid overload secondary to end stage renal disease & CHF      HD outpatient at [facility]     Plan will do HD Thursday on schedule. Will do 5 hours due to his size and instability with Txs.  Will plan 3 days per week treatments  Subjective: Interval History: Had tachycardia during HD.  Objective: Vital signs in last 24 hours: Temp:  [97.8 F (36.6 C)-99.4 F (37.4 C)] 97.8 F (36.6 C) (08/27 1152) Pulse Rate:  [58-89] 77 (08/27 1152) Resp:  [12-33] 12 (08/27 0526) BP: (87-146)/(39-98) 96/41 mmHg (08/27 1152) SpO2:  [92 %-100 %] 95 % (08/27 1152) Weight:  [150.8 kg (332 lb 7.3 oz)-151.8 kg (334 lb 10.5 oz)] 151.7 kg (334 lb 7 oz) (08/27 0556) Weight change: -8.2 kg (-18 lb 1.3 oz)  Intake/Output from previous day: 08/26 0701 - 08/27 0700 In: 476.1 [I.V.:476.1] Out: 2716  Intake/Output this shift: Total I/O In: 440 [P.O.:440] Out: -   General appearance: alert and cooperative Resp: clear to auscultation bilaterally Chest wall: no tenderness Cardio: regular rate and rhythm, S1, S2 normal, no murmur, click, rub or gallop Extremities: edema 2-3 + improved with ace wraps and chronic changes  Lab Results:  Recent Labs  09/21/12 0440 09/22/12 0400  WBC 7.7 7.6  HGB 8.2* 7.8*  HCT 27.5* 26.2*  PLT 170 180   BMET:  Recent Labs  09/21/12 0440 09/22/12 0400  NA 138 137  K 4.1 3.4*  CL 100 98  CO2 25 26  GLUCOSE 130* 86  BUN 24* 18  CREATININE 3.41* 3.08*  CALCIUM 9.0 8.9   No results found for this basename: PTH,  in the last 72 hours Iron Studies: No results found for this basename: IRON, TIBC, TRANSFERRIN, FERRITIN,  in the last 72 hours Studies/Results: No results found.  Scheduled: . cephALEXin  250 mg Oral Q2200  . doxercalciferol  2 mcg Intravenous Q T,Th,Sa-HD  . febuxostat  80 mg Oral Daily  . ferric gluconate (FERRLECIT/NULECIT) IV  125 mg Intravenous Q T,Th,Sa-HD  . insulin aspart  0-9 Units Subcutaneous TID WC  . insulin aspart  2 Units  Subcutaneous TID WC  . insulin glargine  19 Units Subcutaneous QHS  . metoprolol succinate  50 mg Oral BID  . pantoprazole  40 mg Oral Daily  . simvastatin  40 mg Oral QPM  . sodium chloride  3 mL Intravenous Q12H  . warfarin  7.5 mg Oral ONCE-1800  . Warfarin - Pharmacist Dosing Inpatient   Does not apply q1800     LOS: 13 days   Jasiah Buntin C 09/22/2012,12:59 PM

## 2012-09-22 NOTE — Progress Notes (Addendum)
ANTICOAGULATION CONSULT NOTE - Follow-Up Consult  Pharmacy Consult for  Bivalirudin & Coumadin Indication: R/O HIT; afib  Allergies  Allergen Reactions  . Ace Inhibitors     Acute renal failure with multiple trials  . Heparin     HIT panel pending 8/22    Patient Measurements: Height: 6\' 1"  (185.4 cm) Weight: 334 lb 7 oz (151.7 kg) IBW/kg (Calculated) : 79.9   Vital Signs: Temp: 98.5 F (36.9 C) (08/27 0752) Temp src: Oral (08/27 0752) BP: 108/57 mmHg (08/27 0903) Pulse Rate: 77 (08/27 0903)  Labs:  Recent Labs  09/20/12 0848 09/21/12 0440 09/22/12 0400  HGB 8.1* 8.2* 7.8*  HCT 26.9* 27.5* 26.2*  PLT 164 170 180  APTT 78* 79* 88*  LABPROT 22.7* 20.9* 21.9*  INR 2.08* 1.86* 1.98*  CREATININE 4.33* 3.41* 3.08*    Estimated Creatinine Clearance: 45.1 ml/min (by C-G formula based on Cr of 3.08).  Assessment: 47 yo male with complicated PMH including ESRD, new - onset afib and heart failure. Was on IV heparin and platelets decreased >50%-Pharmacy asked to begin argatroban.  8/23 patient experienced VT episodes, QT noted to be prolonged.  Pharmacy consulted asked to switch to bivalirudin in light of the rare incidence of argatroban causing VT and the patients notably elevated aPTT despite multiple aggressive rate decreases.   APTT today= 88 and slightly above goal (likely effect of coumadin), platelet count= 180 (trend up), dopplers negative for DVT, INR this am is 1.98 and subtherapeutic. Of note  INR was 1.31 prior to start of bival and coumadin. The INR showed elevation to 2.08 on 8/25 (after start of bivalirudin) with no coumadin on board.  Typically the INR elevation due to bivalirudin when treating HIT is ~0.5 although the degree of elevation is dose dependent.   Goal of Therapy:  APTT= 50-85 Monitor platelets by anticoagulation protocol: Yes Goal INR: Consider goal INR of > 2.5 (on coumadin and bival)   Plan:  -Decrease bivalirudin at 0.02 mcg/kg/hr  -daily  aPTT -Coumadin to 7.5mg  po today x1 at 1800 -Daily PT/INR -May need to consider goal INR > 2.5 on coumadin and bivalirudin  -HIT panel pending  Harland German, Pharm D 09/22/2012 11:25 AM

## 2012-09-23 LAB — CBC
MCH: 27.5 pg (ref 26.0–34.0)
MCHC: 30.2 g/dL (ref 30.0–36.0)
MCV: 90.8 fL (ref 78.0–100.0)
Platelets: 228 10*3/uL (ref 150–400)

## 2012-09-23 LAB — RENAL FUNCTION PANEL
Albumin: 3 g/dL — ABNORMAL LOW (ref 3.5–5.2)
CO2: 26 mEq/L (ref 19–32)
Calcium: 9 mg/dL (ref 8.4–10.5)
Chloride: 99 mEq/L (ref 96–112)
Creatinine, Ser: 4.19 mg/dL — ABNORMAL HIGH (ref 0.50–1.35)
GFR calc Af Amer: 18 mL/min — ABNORMAL LOW (ref >90)
GFR calc non Af Amer: 15 mL/min — ABNORMAL LOW (ref >90)
Sodium: 138 mEq/L (ref 135–145)

## 2012-09-23 LAB — PROTIME-INR: INR: 1.94 — ABNORMAL HIGH (ref 0.00–1.49)

## 2012-09-23 LAB — GLUCOSE, CAPILLARY

## 2012-09-23 LAB — MAGNESIUM: Magnesium: 1.9 mg/dL (ref 1.5–2.5)

## 2012-09-23 MED ORDER — SODIUM CHLORIDE 0.9 % IV SOLN
0.0200 mg/kg/h | INTRAVENOUS | Status: DC
  Administered 2012-09-23: 0.02 mg/kg/h via INTRAVENOUS
  Filled 2012-09-23 (×2): qty 250

## 2012-09-23 MED ORDER — HEPARIN (PORCINE) IN NACL 100-0.45 UNIT/ML-% IJ SOLN
1800.0000 [IU]/h | INTRAMUSCULAR | Status: DC
  Filled 2012-09-23 (×2): qty 250

## 2012-09-23 MED ORDER — HEPARIN BOLUS VIA INFUSION
3000.0000 [IU] | Freq: Once | INTRAVENOUS | Status: DC
  Filled 2012-09-23: qty 3000

## 2012-09-23 MED ORDER — ALTEPLASE 2 MG IJ SOLR: 2.0000 mg | Freq: Once | INTRAMUSCULAR | Status: DC | PRN

## 2012-09-23 MED ORDER — LIDOCAINE-PRILOCAINE 2.5-2.5 % EX CREA: 1.0000 "application " | TOPICAL_CREAM | CUTANEOUS | Status: DC | PRN

## 2012-09-23 MED ORDER — LIDOCAINE HCL (PF) 1 % IJ SOLN: 5.0000 mL | INTRAMUSCULAR | Status: DC | PRN

## 2012-09-23 MED ORDER — HEPARIN SODIUM (PORCINE) 1000 UNIT/ML DIALYSIS: 1000.0000 [IU] | INTRAMUSCULAR | Status: DC | PRN

## 2012-09-23 MED ORDER — PENTAFLUOROPROP-TETRAFLUOROETH EX AERO: 1.0000 "application " | INHALATION_SPRAY | CUTANEOUS | Status: DC | PRN

## 2012-09-23 MED ORDER — NEPRO/CARBSTEADY PO LIQD: 237.0000 mL | ORAL | Status: DC | PRN

## 2012-09-23 MED ORDER — WARFARIN SODIUM 7.5 MG PO TABS
7.5000 mg | ORAL_TABLET | Freq: Once | ORAL | Status: AC
  Administered 2012-09-23: 7.5 mg via ORAL
  Filled 2012-09-23: qty 1

## 2012-09-23 MED ORDER — ATORVASTATIN CALCIUM 20 MG PO TABS
20.0000 mg | ORAL_TABLET | Freq: Every day | ORAL | Status: DC
  Administered 2012-09-23 – 2012-09-26 (×4): 20 mg via ORAL
  Filled 2012-09-23 (×5): qty 1

## 2012-09-23 MED ORDER — AMIODARONE HCL 200 MG PO TABS
200.0000 mg | ORAL_TABLET | Freq: Two times a day (BID) | ORAL | Status: DC
  Administered 2012-09-23 (×2): 200 mg via ORAL
  Filled 2012-09-23 (×4): qty 1

## 2012-09-23 MED ORDER — DOXERCALCIFEROL 4 MCG/2ML IV SOLN
INTRAVENOUS | Status: AC
  Filled 2012-09-23: qty 2

## 2012-09-23 MED ORDER — SODIUM CHLORIDE 0.9 % IV SOLN: 100.0000 mL | INTRAVENOUS | Status: DC | PRN

## 2012-09-23 MED ORDER — METOPROLOL SUCCINATE ER 25 MG PO TB24
25.0000 mg | ORAL_TABLET | Freq: Two times a day (BID) | ORAL | Status: DC
  Administered 2012-09-23 – 2012-09-27 (×7): 25 mg via ORAL
  Filled 2012-09-23 (×14): qty 1

## 2012-09-23 NOTE — Progress Notes (Signed)
Pt with no IV access d/t difficult cannulation, angiomax infusing via hemodialysis catheter. Will have to stop infusion during dialysis today for 4 hours.  Cardiology and pharmacy aware.

## 2012-09-23 NOTE — Progress Notes (Signed)
Physical Therapy Treatment Patient Details Name: Adam [name] MRN: [id number] DOB: [birth year] Today's Date: 09/23/2012 Time: 4098-1191 PT Time Calculation (min): 12 min  PT Assessment / Plan / Recommendation  History of Present Illness Pt adm to [facility] due to 50lb weight gain in less than a month, pt admits to increased SOB recenty and Lt LE pain/swelling from fluid retention. Pt with uncontrolled diabetes and cardiomyopathy.     PT Comments   Improved gait stability, no overt dyspnea, but pt chose 2 standing rest breaks during gait.  Follow Up Recommendations  Home health PT;Supervision for mobility/OOB     Does the patient have the potential to tolerate intense rehabilitation     Barriers to Discharge        Equipment Recommendations  None recommended by PT    Recommendations for Other Services    Frequency Min 3X/week   Progress towards PT Goals Progress towards PT goals: Progressing toward goals  Plan Current plan remains appropriate    Precautions / Restrictions Precautions Precautions: None Restrictions Weight Bearing Restrictions: No   Pertinent Vitals/Pain     Mobility  Transfers Transfers: Sit to Stand;Stand to Sit Sit to Stand: 7: Independent;From chair/3-in-1;Other (comment) (from bari chair) Stand to Sit: 7: Independent;To chair/3-in-1 Ambulation/Gait Ambulation/Gait Assistance: 7: Independent Ambulation Distance (Feet): 80 Feet (in room) Assistive device: None Ambulation/Gait Assistance Details: steady gait,  2 standing rest breaks, but no dyspnea. Gait Pattern: Within Functional Limits (excessive lateral w/shift) Gait velocity: slow Stairs: No    Exercises     PT Diagnosis:    PT Problem List:   PT Treatment Interventions:     PT Goals (current goals can now be found in the care plan section) Acute Rehab PT Goals Time For Goal Achievement: 09/19/12 Potential to Achieve Goals: Good  Visit Information  Last PT Received On: 09/23/12 Assistance  Needed: +1 History of Present Illness: Pt adm to [facility] due to 50lb weight gain in less than a month, pt admits to increased SOB recenty and Lt LE pain/swelling from fluid retention. Pt with unctrolled diabetes and cardiomyopathy.      Subjective Data  Subjective: I do want to go out in the hall, I've had a pretty hard day with 5 hours in HD.   Cognition  Cognition Arousal/Alertness: Awake/alert Behavior During Therapy: WFL for tasks assessed/performed Overall Cognitive Status: Within Functional Limits for tasks assessed    Balance  Balance Balance Assessed: Yes Static Sitting Balance Static Sitting - Balance Support: Feet supported Static Sitting - Level of Assistance: 7: Independent Static Standing Balance Static Standing - Balance Support: Left upper extremity supported;During functional activity Static Standing - Level of Assistance: 6: Modified independent (Device/Increase time)  End of Session PT - End of Session Activity Tolerance: Patient tolerated treatment well Patient left: in chair;with call bell/phone within reach;with family/visitor present Nurse Communication: Mobility status   GP     Jidenna Figgs, Eliseo Gum 09/23/2012, 5:03 PM 09/23/2012  [place]iba Bing, PT [phone]  (pager)

## 2012-09-23 NOTE — Progress Notes (Signed)
  Date: 09/23/2012  Patient name: Adam [name]  Medical record number: [id number]  Date of birth: Jan 14, 1966   This patient has been seen and the plan of care was discussed with the house staff. Please see their note for complete details. I concur with their findings with the following additions/corrections:  Please note Dr. Johna Roles and Dr. Stevenson Clinch note for most up to date plan.   Inez Catalina, MD 09/23/2012, 8:44 PM

## 2012-09-23 NOTE — Progress Notes (Signed)
ANTICOAGULATION CONSULT NOTE - Follow Up Consult  Pharmacy Consult for Angiomax + Warfarin Indication: New onset Afib, r/o HIT  Allergies  Allergen Reactions  . Argatroban Other (See Comments)    Ventricular tachycardia  . Ace Inhibitors     Acute renal failure with multiple trials  . Heparin     ? HIT    Patient Measurements: Height: 6\' 1"  (185.4 cm) Weight: 321 lb 3.4 oz (145.7 kg) IBW/kg (Calculated) : 79.9 Heparin Dosing Weight: 115 kg  Vital Signs: Temp: 98.6 F (37 C) (08/28 1758) Temp src: Oral (08/28 1758) BP: 94/61 mmHg (08/28 1758) Pulse Rate: 79 (08/28 1758)  Labs:  Recent Labs  09/21/12 0440 09/22/12 0400 09/23/12 0504 09/23/12 1750  HGB 8.2* 7.8* 8.1*  --   HCT 27.5* 26.2* 26.8*  --   PLT 170 180 228  --   APTT 79* 88* 86* 72*  LABPROT 20.9* 21.9* 21.6*  --   INR 1.86* 1.98* 1.94*  --   CREATININE 3.41* 3.08* 4.19*  --     Estimated Creatinine Clearance: 32.4 ml/min (by C-G formula based on Cr of 4.19).   Medications:  Angiomax off x 4 hours for HD today  Assessment: 47 y.o. M with complicated PMH including ESRD, new onset Afib and heart failure. The patient was started receiving heparin SQ for VTE px and with hemodilaysis starting on 8/14. On 8/18, the patient was switched to IV heparin for new-onset Afib. Platelets dropped >50% by 8/22 and a HIT panel was sent off. The patient was started on argatroban and on 8/23 experienced VT episodes, QT noted to be prolonged. Pharmacy consulted asked to switch to bivalirudin in light of the rare incidence of argatroban causing VT and the patients notably elevated aPTT despite multiple aggressive rate decreases.   The HIT panel results have come back today showing antibody positive and SRA negative. The antibody test has high sensitivity (~95%) and low specificity (60-80%). SAR is considered the *gold standard* with both a high sensitivity (88-100%) and specificity (89-100%). Additional testing reports the  optical density to be 2.694 which increases the chance that this is true HIT despite the SRA being negative and the IM team wishes to consult hematology.   Angiomax was turned off during HD today as it was running through the HD port and the patient has no other access. Cardiology is aware that the patient was without anticoagulation during this session. INR today remains SUBtherapeutic (1.94 << 1.98, goal of 2-3) -- although it is noted that angiomax can falsely elevate INR and we will need to shoot for a higher INR goal of > 2.5 while on concurrent treatment. Hgb/Hct remains low but stable (hx anemia). Plts continue trending up, 228 <<180.   Will follow-up hematology consult for determinate of true heparin allergy. If deemed likely HIT, will need to enter heparin allergy.  Goal of Therapy:  aPTT 50-85 seconds Monitor platelets by anticoagulation protocol: Yes Goal INR: Consider goal INR of > 2.5 (on coumadin and bival)  Plan:  -Continue Bivalirudin infusion at 0.02 mcg/kg/hr (5.8 ml/hr) -Warfarin per previous note with PT/INR in the AM -Continue to monitor for any signs/symptoms of bleeding -F/U next aPTT with AM labs (0500) -F/U hematology recommendations   Thank you for allowing me to take part in this patient's care,  Abran Duke, PharmD Clinical Pharmacist Phone: [phone] Pager: [phone] 09/23/2012 6:31 PM

## 2012-09-23 NOTE — Procedures (Signed)
Tolerating HD. Trying to continue to remiove fluid.  Pt not sure he will tolerate 5 hours. Encouraged Avid Guillette C

## 2012-09-23 NOTE — Progress Notes (Signed)
ANTICOAGULATION CONSULT NOTE - Follow Up Consult  Pharmacy Consult for Angiomax + Warfarin Indication: New onset Afib, r/o HIT  Allergies  Allergen Reactions  . Ace Inhibitors     Acute renal failure with multiple trials  . Heparin     SRA negative, HIT panel positive,     Patient Measurements: Height: 6\' 1"  (185.4 cm) Weight: 332 lb 3.7 oz (150.7 kg) IBW/kg (Calculated) : 79.9 Heparin Dosing Weight: 115 kg  Vital Signs: Temp: 98.2 F (36.8 C) (08/28 0700) Temp src: Oral (08/28 0700) BP: 111/74 mmHg (08/28 0900) Pulse Rate: 62 (08/28 0900)  Labs:  Recent Labs  09/21/12 0440 09/22/12 0400 09/23/12 0504  HGB 8.2* 7.8* 8.1*  HCT 27.5* 26.2* 26.8*  PLT 170 180 228  APTT 79* 88* 86*  LABPROT 20.9* 21.9* 21.6*  INR 1.86* 1.98* 1.94*  CREATININE 3.41* 3.08* 4.19*    Estimated Creatinine Clearance: 33 ml/min (by C-G formula based on Cr of 4.19).   Medications:  Angiomax off x 4 hours for HD session today  Assessment: 47 y.o. M with complicated PMH including ESRD, new onset Afib and heart failure. The patient was started receiving heparin SQ for VTE px and with hemodilaysis starting on 8/14. On 8/18, the patient was switched to IV heparin for new-onset Afib. Platelets dropped >50% by 8/22 and a HIT panel was sent off. The patient was started on argatroban and on 8/23 experienced VT episodes, QT noted to be prolonged. Pharmacy consulted asked to switch to bivalirudin in light of the rare incidence of argatroban causing VT and the patients notably elevated aPTT despite multiple aggressive rate decreases.   The HIT panel results have come back today showing antibody positive and SRA negative. The antibody test has high sensitivity (~95%) and low specificity (60-80%). SAR is considered the *gold standard* with both a high sensitivity (88-100%) and specificity (89-100%). Additional testing report the optical density to be 2.694 which increases the chance that this is true HIT  despite the SRA being negative and the IM team wishes to consult hematology.   Angiomax was turned off during HD today as it was running through the HD port and the patient has no other access. Cardiology is aware that the patient was without anticoagulation during this session. INR today remains SUBtherapeutic (1.94 << 1.98, goal of 2-3) -- although it is noted that angiomax can falsely elevate INR and we will need to shoot for a higher INR goal of > 2.5 while on concurrent treatment. Hgb/Hct remains low but stable (hx anemia). Plts continue trending up, 228 <<180. Amiodarone started today which will increase warfarin sensitivity, will dose warfarin at 7.5 mg again today.  Will follow-up hematology consult for determinate of true heparin allergy. If deemed likely HIT, will need to enter heparin allergy.  Goal of Therapy:  aPTT 50-85 seconds Monitor platelets by anticoagulation protocol: Yes Goal INR: Consider goal INR of > 2.5 (on coumadin and bival)  Plan:  1. Resume Bivalirudin at previously therapeutic rate of 0.02 mcg/kg/hr (5.8 ml/hr) 2. Warfarin 7.5 mg x 1 dose at 1800 today 3. Will continue to monitor for any signs/symptoms of bleeding and will follow up with aPTT 2 hours after drip rate initiated and PT/INR in the a.m.   Georgina Pillion, PharmD, BCPS Clinical Pharmacist Pager: [phone] 09/23/2012 10:21 AM

## 2012-09-23 NOTE — Progress Notes (Signed)
Patient ID: Adam [name], male   DOB: 16-Jul-1965, 47 y.o.   MRN: [id number] PAdvanced Heart Failure Rounding Note  SUBJECTIVE: Seen at HD today.  Weight down about 18 lbs total.  INR still subtherapeutic.  Breathing better.  Runs of atrial fibrillation with RVR continue with fall in BP when in atrial fibrillation.  Currently NSR.    Marland Kitchen amiodarone  200 mg Oral BID  . cephALEXin  250 mg Oral Q2200  . doxercalciferol  2 mcg Intravenous Q T,Th,Sa-HD  . febuxostat  80 mg Oral Daily  . ferric gluconate (FERRLECIT/NULECIT) IV  125 mg Intravenous Q T,Th,Sa-HD  . insulin aspart  0-9 Units Subcutaneous TID WC  . insulin aspart  2 Units Subcutaneous TID WC  . insulin glargine  19 Units Subcutaneous QHS  . metoprolol succinate  25 mg Oral BID  . multivitamin  1 tablet Oral QHS  . pantoprazole  40 mg Oral Daily  . simvastatin  40 mg Oral QPM  . sodium chloride  3 mL Intravenous Q12H  . Warfarin - Pharmacist Dosing Inpatient   Does not apply q1800  bivalirudin gtt   Filed Vitals:   09/23/12 0400 09/23/12 0405 09/23/12 0455 09/23/12 0700  BP: 91/43   98/59  Pulse:      Temp:  98.8 F (37.1 C)  98.2 F (36.8 C)  TempSrc:  Oral  Oral  Resp: 20   21  Height:      Weight:   150.7 kg (332 lb 3.7 oz) 150.7 kg (332 lb 3.7 oz)  SpO2:  92%  90%    Intake/Output Summary (Last 24 hours) at 09/23/12 0745 Last data filed at 09/23/12 0700  Gross per 24 hour  Intake 1354.78 ml  Output      0 ml  Net 1354.78 ml    LABS: Basic Metabolic Panel:  Recent Labs  40/98/11 0400 09/23/12 0504  NA 137 138  K 3.4* 3.6  CL 98 99  CO2 26 26  GLUCOSE 86 115*  BUN 18 25*  CREATININE 3.08* 4.19*  CALCIUM 8.9 9.0  MG 1.8 1.9  PHOS 2.6 3.6   Liver Function Tests:  Recent Labs  09/22/12 0400 09/23/12 0504  ALBUMIN 2.9* 3.0*   No results found for this basename: LIPASE, AMYLASE,  in the last 72 hours CBC:  Recent Labs  09/22/12 0400 09/23/12 0504  WBC 7.6 8.5  HGB 7.8* 8.1*  HCT 26.2* 26.8*   MCV 91.3 90.8  PLT 180 228   Cardiac Enzymes: No results found for this basename: CKTOTAL, CKMB, CKMBINDEX, TROPONINI,  in the last 72 hours BNP: No components found with this basename: POCBNP,  D-Dimer: No results found for this basename: DDIMER,  in the last 72 hours Hemoglobin A1C: No results found for this basename: HGBA1C,  in the last 72 hours Fasting Lipid Panel: No results found for this basename: CHOL, HDL, LDLCALC, TRIG, CHOLHDL, LDLDIRECT,  in the last 72 hours Thyroid Function Tests: No results found for this basename: TSH, T4TOTAL, FREET3, T3FREE, THYROIDAB,  in the last 72 hours Anemia Panel: No results found for this basename: VITAMINB12, FOLATE, FERRITIN, TIBC, IRON, RETICCTPCT,  in the last 72 hours  RADIOLOGY: Dg Chest 2 View  08/26/2012   *RADIOLOGY REPORT*  Clinical Data: Preop for dialysis catheter insertion  CHEST - 2 VIEW  Comparison: 10/28/2011  Findings: Cardiomegaly again noted.  Single lead cardiac pacemaker is unchanged in position.  No acute infiltrate or pleural effusion. No pulmonary edema.  Bony thorax is stable.  IMPRESSION:  No active disease.  Cardiomegaly again noted.  Single lead cardiac pacemaker in place.   Original Report Authenticated By: Natasha Mead, M.D.   Dg Chest Port 1 View  09/10/2012   *RADIOLOGY REPORT*  Clinical Data: Lower extremity swelling.  Weakness and dizziness.  PORTABLE CHEST - 1 VIEW  Comparison: 08/31/2012.  Findings: The pacer wire / AICD is stable.  The dialysis catheter is unchanged.  The heart is enlarged and there is vascular congestion and pulmonary edema.  No definite pleural effusion.  IMPRESSION: Cardiac enlargement and pulmonary edema suggesting CHF.  No definite pleural effusion.   Original Report Authenticated By: Rudie Meyer, M.D.   Dg Chest Port 1 View  08/31/2012   *RADIOLOGY REPORT*  Clinical Data: Status post hemodialysis catheter placement  PORTABLE CHEST - 1 VIEW  Comparison: Prior chest x-ray 08/26/2012   Findings: Interval placement of a right IJ approach tunneled hemodialysis catheter.  The catheter tips project over the superior cavoatrial junction.  No evidence of pneumothorax.  Enlargement the cardiopericardial silhouette with pulmonary vascular congestion and mild interstitial edema.  Stable left subclavian approach cardiac rhythm maintenance device with the single lead projecting over the right ventricle.  No acute osseous abnormality.  IMPRESSION:  1.  Right IJ approach tunneled hemodialysis catheter with the tips projecting over the superior cavoatrial junction. 2.  Cardiomegaly with mild CHF versus volume overload.   Original Report Authenticated By: Malachy Moan, M.D.   Dg Foot Complete Left  09/12/2012   *RADIOLOGY REPORT*  Clinical Data: Left toe infection, left dorsal foot ulcer for 1 month, rule out osteomyelitis left big toe  LEFT FOOT - COMPLETE 3+ VIEW  Comparison: None.  Findings: Extensive small vessel calcification.  No fracture dislocation.  No periosteal reaction or cortical destruction.  IMPRESSION: No evidence for osteomyelitis.   Original Report Authenticated By: Esperanza Heir, M.D.   Dg Fluoro Guide Cv Line-no Report  08/31/2012   CLINICAL DATA: Dialysis Catheter Placement   FLOURO GUIDE CV LINE  Fluoroscopy was utilized by the requesting physician.  No radiographic  interpretation.     PHYSICAL EXAM General: NAD Neck: JVP 12 cm, no thyromegaly or thyroid nodule.  Lungs: Clear to auscultation bilaterally with normal respiratory effort. CV: Nondisplaced PMI.  Heart regular S1/S2, no S3/S4, no murmur.  Legs wrapped with 1+ edema to knees.   Abdomen: Soft, nontender, no hepatosplenomegaly, no distention.  Neurologic: Alert and oriented x 3.  Psych: Normal affect. Extremities: No clubbing or cyanosis.   TELEMETRY: Reviewed telemetry pt in NSR, runs of atrial fibrillation with RVR last night.   ASSESSMENT AND PLAN: 47 yo with HTN/DM, nonischemic cardiomyopathy, and  baseline severe CKD was admitted with acute on chronic systolic CHF and progression to ESRD.  1. CHF: Acute on chronic systolic. Nonischemic cardiomyopathy with last EF 25%. Etiology uncertain: No ETOH or drug abuse, normal coronaries in 2006 (after CMP identified). Cannot rule out familial CMP, father died at 55 of uncertain cause. 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 He remains volume overloaded. Blood pressure is soft, with SBP in 100s. This will limit medication titration, especially as he will be getting dialysis.  - Ongoing HD for fluid removal.  HD limited by BP/HR.  He has a lot of volume but removal is going to have to be gradual. - Continue Toprol XL.  - For now, hold off on ACEI, Bidil given lack of BP room.  2. Atrial arrhythmias: Atrial fibrillation and possibly  flutter have been noted paroxysmally.  BP drops when in atrial fibrillation with RVR.  - Add amiodarone 200 mg bid to try to keep him in NSR. - Decrease Toprol XL to 25 mg bid given initiation of amiodarone and soft BP. - He will continue bivalirudin overlap to coumadin per pharmacy. Will continue bivalirudin until INR is 2.5 or above. 3. ESRD: Per renal. Needs ongoing fluid removal.  4. NSVT: Follow, no prolonged events by ICD interrogation.  5. 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  SRA is gold standard so suspect this means no type 2 HIT but will need to confirm by conversation with pharmacy or hematology.   Marca Ancona 09/23/2012

## 2012-09-23 NOTE — Progress Notes (Signed)
Pt being transferred to 6E19, all pt belongings packed up by RN and NT and sent to new room. Pt barichair and walker also sent to new room. Meds packed up and sent to new room as well. Report called and given to Lurena Joiner, RN. Pt has been in dialysis since ~0630 this AM.    Delynn Flavin, RN

## 2012-09-23 NOTE — Progress Notes (Addendum)
Subjective:  Pt seen and examined in HD. Reports he did not have a good night due to flutter in his chest. Patient denies CP, dyspnea, and palpitations at this time. No nausea, vomiting, or abdominal pain. Normal BM and urination. Reports foot dressing was too tight on his right foot and had nurse adjust it last night.     Objective: Vital signs in last 24 hours: Filed Vitals:   09/23/12 0000 09/23/12 0400 09/23/12 0405 09/23/12 0455  BP: 91/47 91/43    Pulse:      Temp: 99.5 F (37.5 C)  98.8 F (37.1 C)   TempSrc: Oral  Oral   Resp: 20 20    Height:      Weight:    150.7 kg (332 lb 3.7 oz)  SpO2: 90%  92%    Weight change: -1.1 kg (-2 lb 6.8 oz)  Intake/Output Summary (Last 24 hours) at 09/23/12 0602 Last data filed at 09/23/12 0600  Gross per 24 hour  Intake 1356.28 ml  Output      0 ml  Net 1356.28 ml   Constitutional: Obese, NAD Head: Normocephalic Eyes: PERRL Neck: Supple Cardiovascular:  RRR, S1 normal, S2 normal, pitting +2 edema b/l from thighs to abdomen.  Pulmonary/Chest: Air entry equal bilaterally. Clear to auscultation bilaterally Abdominal: Soft. Non-tender, non-distended, bowel sounds are normal, no masses, organomegaly, or guarding present.  Musculoskeletal: No synovitis, effusions, or erythema of bilateral upper and lower  appendicular joints  Neurological: A&O x3 Skin: both LE wrapped    Psych: depressed mood    Lab Results: Basic Metabolic Panel:  Recent Labs Lab 09/21/12 0440 09/22/12 0400  NA 138 137  K 4.1 3.4*  CL 100 98  CO2 25 26  GLUCOSE 130* 86  BUN 24* 18  CREATININE 3.41* 3.08*  CALCIUM 9.0 8.9  MG 1.9 1.8  PHOS 2.9 2.6   Liver Function Tests:  Recent Labs Lab 09/18/12 1347  09/21/12 0440 09/22/12 0400  AST 13  --   --   --   ALT <5  --   --   --   ALKPHOS 119*  --   --   --   BILITOT 3.2*  --   --   --   PROT 7.5  --   --   --   ALBUMIN 3.0*  < > 3.0* 2.9*  < > = values in this interval not  displayed. No results found for this basename: LIPASE, AMYLASE,  in the last 168 hours No results found for this basename: AMMONIA,  in the last 168 hours CBC:  Recent Labs Lab 09/22/12 0400 09/23/12 0504  WBC 7.6 8.5  HGB 7.8* 8.1*  HCT 26.2* 26.8*  MCV 91.3 90.8  PLT 180 228   Cardiac Enzymes:  Recent Labs Lab 09/18/12 1347 09/18/12 1909 09/19/12 0200  TROPONINI <0.30 <0.30 <0.30   BNP: No results found for this basename: PROBNP,  in the last 168 hours D-Dimer: No results found for this basename: DDIMER,  in the last 168 hours CBG:  Recent Labs Lab 09/21/12 2137 09/22/12 0750 09/22/12 1151 09/22/12 1716 09/22/12 2129 09/23/12 0346  GLUCAP 152* 85 107* 94 131* 113*   Hemoglobin A1C: No results found for this basename: HGBA1C,  in the last 168 hours Fasting Lipid Panel: No results found for this basename: CHOL, HDL, LDLCALC, TRIG, CHOLHDL, LDLDIRECT,  in the last 168 hours Thyroid Function Tests: No results found for this basename: TSH, T4TOTAL,  [place], THYROIDAB,  in the last 168 hours Coagulation:  Recent Labs Lab 09/20/12 0848 09/21/12 0440 09/22/12 0400 09/23/12 0504  LABPROT 22.7* 20.9* 21.9* 21.6*  INR 2.08* 1.86* 1.98* 1.94*   Anemia Panel:  Recent Labs Lab 09/18/12 2220  VITAMINB12 728  FOLATE 12.8  FERRITIN 108  RETICCTPCT 3.2*   Urine Drug Screen: Drugs of Abuse     Component Value Date/Time   LABOPIA NEG 03/17/2006 1455   COCAINSCRNUR NEG 03/17/2006 1455   LABBENZ NEG 03/17/2006 1455   AMPHETMU NEG 03/17/2006 1455    Alcohol Level: No results found for this basename: ETH,  in the last 168 hours Urinalysis: No results found for this basename: COLORURINE, APPERANCEUR, LABSPEC, PHURINE, GLUCOSEU, HGBUR, BILIRUBINUR, KETONESUR, PROTEINUR, UROBILINOGEN, NITRITE, LEUKOCYTESUR,  in the last 168 hours Misc. Labs:   Micro Results: Recent Results (from the past 240 hour(s))  MRSA PCR SCREENING     Status: None   Collection  Time    09/18/12  7:47 PM      Result Value Range Status   MRSA by PCR NEGATIVE  NEGATIVE Final   Comment:            The GeneXpert MRSA Assay (FDA     approved for NASAL specimens     only), is one component of a     comprehensive MRSA colonization     surveillance program. It is not     intended to diagnose MRSA     infection nor to guide or     monitor treatment for     MRSA infections.   Studies/Results: No results found. Medications: I have reviewed the patient's current medications. Scheduled Meds: . cephALEXin  250 mg Oral Q2200  . doxercalciferol  2 mcg Intravenous Q T,Th,Sa-HD  . febuxostat  80 mg Oral Daily  . ferric gluconate (FERRLECIT/NULECIT) IV  125 mg Intravenous Q T,Th,Sa-HD  . insulin aspart  0-9 Units Subcutaneous TID WC  . insulin aspart  2 Units Subcutaneous TID WC  . insulin glargine  19 Units Subcutaneous QHS  . metoprolol succinate  50 mg Oral BID  . multivitamin  1 tablet Oral QHS  . pantoprazole  40 mg Oral Daily  . simvastatin  40 mg Oral QPM  . sodium chloride  3 mL Intravenous Q12H  . Warfarin - Pharmacist Dosing Inpatient   Does not apply q1800   Continuous Infusions: . sodium chloride 13 mL/hr at 09/22/12 0700  . bivalirudin (ANGIOMAX) infusion 0.5 mg/mL (Non-ACS indications) 0.02 mg/kg/hr (09/22/12 1500)   PRN Meds:.sodium chloride, sodium chloride, sodium chloride, acetaminophen, diclofenac sodium, docusate sodium, feeding supplement (NEPRO CARB STEADY), lidocaine (PF), lidocaine-prilocaine, LORazepam, pentafluoroprop-tetrafluoroeth, sodium chloride Assessment/Plan: Principal Problem:   End stage renal disease Active Problems:   Type II or unspecified type diabetes mellitus with renal manifestations, uncontrolled(250.42)   Hypopotassemia   Morbid obesity   ANXIETY   TOBACCO ABUSE   OBSTRUCTIVE SLEEP APNEA   HYPERTENSION   CARDIOMYOPATHY   Chronic systolic heart failure   GERD   Leg swelling   ICD (implantable  cardioverter-defibrillator), single, in situ; [employer]   Normocytic anemia   Open wound of foot    #Thrombocytopenia - resolved s/p discontinuing heparin. 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 IV Heparin started on 8/18 with thrombocytopenia on 8/19, stopped on 8.22.  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 -Per hematology Dr. Roanna Raider - To repeat SRA, NO heparin, continue angiomax and coumadin until INR 2-3, then coumadin      -Due to epsiodes of Vtach, argatraban that was started on 8/23 stopped on 8/24 due to risk of  cardiac arrest (6%), ventricular tachycardia (5%), bradycardia (5%), myocardial infarction (PCI: 4%), atrial fibrillation (3%), angina (2%)  -Angiomax per pharmacy until INR therapeutic, per last pharmacy note >/= 2.5, INR today 1.94  -On coumadin since  8/25 - 8/24 DUS of bilateral extremities to r/o DVT - no DVT or SVT in bilateral extremities  - Per cardiology - if HIT ab positive can overlap angiomax/coumadin until INR therapeutic  -Monitor for bleeding   # Atrial Fibrallation - new onset in setting of volume overload (ESRD and CHF)  -No firing of AICD - no interrogation needed per cards - Cardiology consult - rate control for now, cardioversion if needed  -Heart Failure consult - Transition metoprolol to Toprol XL 50mg  BID, no events per interrogation of device  -On coumadin 8/25 for [facility]  -12-lead EKG 8/27 - episodes of atrial tach and PAF- continue current medications  -Per Dr. Shirlee Latch -  Add amiodarone 200 mg bid to try to keep him in NSR, &decrease Toprol XL to 25 mg bid given initiation of amiodarone    # ESRD with severe volume overload refractory to diuretic therapy necessitating emergent HD, with right arm AV cimino fistula and  perm cath  -HD 8/14 -  tolerated well, 4L off, episode of hypotension and tachycardia - asymptomatic -HD 8/16 -  3L off  -HD 8/18 -  4L off -HD 8/20 - tacyhcardia,  2.9L off -HD 8/21-  Erratic HR, bp low 100s,  2.9L off  -HD 8/23 - 2.7L off -HD 8/25 - 3.2L -HD 8/26 - 2.7L , tachycardia 140-150's, UF was turned off -HD today -Carb restricted diet with 1.0L fluid restriction  -Continue daily weight - 363 to 332 wt loss since admission -Monitor for hyperkalemia  -PT - home health PT recommended -OT evaluation - needs home health OT, 3 in 1 bedside comode;Other (comment);Tub/shower bench (needs wide BSC and RW for over 300#. Needs [employer] tub bench -Pt to have advanced home health care -Pt to call medicaid transportation for options regarding transportation services to HD  -Per Dr. Lowell Guitar to receive HD 3x/week as outpatient -on TTS schedule, 5hr session  #Dyspnea - stable, present with exertion, not at rest.  Patient with h/o of CHF, anemia, and CKD with ongoing dyspnea for past month due to fluid overload requiring emergent hemodialysis. Also with acute on chronic systolic/ diastolic failure. Since admission, patient has had several episodes of  ventricular tachycardia with atrial fibrillation necessitating AC therapy with heparin drip on 8/18.  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 Argatraban was stopped due to episodes of tachycardia and known cardiac side effects. Instead angiomax started. VT most likely due to new onset atrial fibrillation in setting of CHF exacerbation. Doppler US of bilateral lower extremity with no evidence of clot and patient with  no leg pain. Patient with Geneva score of 3.0 points, indicating low risk for PE 7-9% incidence. Patient has been anitcoagulated since 8/18 making pulmonary embolus less likely. He is presently not dyspneic, tachypneic, or tachycardic and normal oxygen saturation on 2-3L.     -  Do not recommend V/Q scan - if  low/intermediate probability will not exclude PE. If normal would exclude PE but in setting of vascular congestion and pulmonary edema will most likely not be normal.        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 Left great toe with what appears to be an infected paronychia, recommend I & D of paronychia and antibiotics -Obtain Xray left foot  - no osteomyelitis  -Obtain ABI - R 1.41 L 1.39 - suggesting calcified vessels, waveforms of b/l LE WNL -Ortho consult - Per Dr. Eulah Pont no surgical debridement at this time, continue wound care, antibiotics - keflex 250mg  daily on day 8/10     - Per Dr. Lajoyce Corners - wound care nurses to apply a Profore wrap 4 both lower extremities. To be d/c with compression wraps and f/u in office for wound care -DUS bilateral LE - no DVT or SVT in bilateral lower extremities     #DM c/b dyslipidemia - last HbA1c 9.1 on 07/27/2012  -CBG WNL today - Lantus 19U at night - Novolog 2U, SSI for meal-time coverage  - Continue home statin  - Lipid panel 03/2012 - low HDL (26)  #Normocytic Anemia - asymptomatic, most likely due to CKD, stable   -To receive weekly erythropoetin injection, target Hct  33-36% -Iron deficiency (19), TIBC WNL, Iron sat low, to receive IV iron infusion with HD  -Monitor Potassium - 3.4 on 8/14 now WNL     #Hyperphosphatemia -  Resolved, 4.8 on  8/16 - Phosphate restriction 1g/d  -Consider phosphate binder therapy with meals  #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 Nausea w/o vomiting or  abdominal pain, normal BM, likely due to HD and electrolyte/fluid shift;  started 8/15, 1 episode of green emesis on 8/16 and again 8/17 after eating   -Zofran as needed for nausea  #Somnolence - resolved. Pt reports increased sleepiness after starting HD  --ABG on 8/15- hypoxia, hypocapnia, low bicarbonate - D/c narcotics    # Prophylaxis : Angiomax & coumadin      Dispo: Disposition is deferred at this time, awaiting improvement of current medical problems.  Anticipated discharge in approximately  day(s).   The patient does have a current  PCP Lars Masson, MD) and does need an [facility] hospital follow-up appointment after discharge.  The patient does have transportation limitations that hinder transportation to clinic appointments.  .Services Needed at time of discharge: Y = Yes, Blank = No PT:   OT:   RN:   Equipment:   Other:     LOS: 14 days   Otis Brace, MD 09/23/2012, 6:02 AM

## 2012-09-23 NOTE — Progress Notes (Signed)
S: Discussed with Dr. Golden Circle about the management of possible HIT. 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        The following study examined "1553 patient samples tested the correlation between the OD in a HIT ELISA and a functional laboratory test considered to be the gold standard (ie, a serotonin release assay [SRA)] "        Warkentin TE, Starlyn Skeans, Sampson Goon, et al. Quantitative interpretation of optical density measurements using PF4-dependent enzyme-immunoassays. J Thromb Haemost 2008; 6:1304.       " In this study, the likelihood of a strongly positive SRA was as follows [91]:  ?OD <0.40 - SRA positive in 0.0 to 0.1 percent ?OD 0.40 to <1.00 - SRA positive in 1 to 5 percent ?OD 1.00 to <1.40 - SRA positive in 18 to 30 percent ?OD 1.40 to <2.00 - SRA positive in 19 to 46 percent ?OD >2.00 - SRA positive in 89 to 100 percent"   Plan: Based on above evidence, there is a strong probability that this patient still has HIT with a negative SRA ( 11% could be negative).  - will call Hemo consult - will NOT start Heparin - will continue Angiomax for HIT treatment until hemo consult sees patient today.  Dede Query, MD PGY-3 [facility] 1:45 PM

## 2012-09-24 DIAGNOSIS — T45515A Adverse effect of anticoagulants, initial encounter: Secondary | ICD-10-CM

## 2012-09-24 DIAGNOSIS — S91309A Unspecified open wound, unspecified foot, initial encounter: Secondary | ICD-10-CM

## 2012-09-24 DIAGNOSIS — I83009 Varicose veins of unspecified lower extremity with ulcer of unspecified site: Secondary | ICD-10-CM

## 2012-09-24 DIAGNOSIS — Z888 Allergy status to other drugs, medicaments and biological substances status: Secondary | ICD-10-CM

## 2012-09-24 LAB — RENAL FUNCTION PANEL
CO2: 26 mEq/L (ref 19–32)
Chloride: 94 mEq/L — ABNORMAL LOW (ref 96–112)
Creatinine, Ser: 3.34 mg/dL — ABNORMAL HIGH (ref 0.50–1.35)
GFR calc Af Amer: 24 mL/min — ABNORMAL LOW (ref >90)
GFR calc non Af Amer: 20 mL/min — ABNORMAL LOW (ref >90)
Potassium: 3.3 mEq/L — ABNORMAL LOW (ref 3.5–5.1)

## 2012-09-24 LAB — PROTIME-INR: INR: 2.12 — ABNORMAL HIGH (ref 0.00–1.49)

## 2012-09-24 LAB — GLUCOSE, CAPILLARY
Glucose-Capillary: 107 mg/dL — ABNORMAL HIGH (ref 70–99)
Glucose-Capillary: 118 mg/dL — ABNORMAL HIGH (ref 70–99)
Glucose-Capillary: 137 mg/dL — ABNORMAL HIGH (ref 70–99)

## 2012-09-24 LAB — CBC
MCH: 27.5 pg (ref 26.0–34.0)
Platelets: 255 10*3/uL (ref 150–400)
RBC: 3.09 MIL/uL — ABNORMAL LOW (ref 4.22–5.81)
RDW: 19.7 % — ABNORMAL HIGH (ref 11.5–15.5)
WBC: 9.7 10*3/uL (ref 4.0–10.5)

## 2012-09-24 LAB — MAGNESIUM: Magnesium: 2 mg/dL (ref 1.5–2.5)

## 2012-09-24 MED ORDER — AMIODARONE HCL 200 MG PO TABS
400.0000 mg | ORAL_TABLET | Freq: Two times a day (BID) | ORAL | Status: DC
  Administered 2012-09-24 – 2012-09-27 (×7): 400 mg via ORAL
  Filled 2012-09-24 (×8): qty 2

## 2012-09-24 MED ORDER — DARBEPOETIN ALFA-POLYSORBATE 150 MCG/0.3ML IJ SOLN
150.0000 ug | INTRAMUSCULAR | Status: DC
  Administered 2012-09-25: 150 ug via INTRAVENOUS
  Filled 2012-09-24: qty 0.3

## 2012-09-24 MED ORDER — WARFARIN SODIUM 7.5 MG PO TABS
7.5000 mg | ORAL_TABLET | Freq: Once | ORAL | Status: AC
  Administered 2012-09-24: 7.5 mg via ORAL
  Filled 2012-09-24: qty 1

## 2012-09-24 MED ORDER — ONDANSETRON HCL 4 MG/2ML IJ SOLN
4.0000 mg | Freq: Once | INTRAMUSCULAR | Status: AC
Start: 2012-09-24 — End: 2012-09-24
  Administered 2012-09-24: 4 mg via INTRAVENOUS
  Filled 2012-09-24: qty 2

## 2012-09-24 NOTE — Progress Notes (Signed)
Pt refuses cpap tonight-

## 2012-09-24 NOTE — Progress Notes (Signed)
Patient ID: Adam [name], male   DOB: 13-Jan-1966, 47 y.o.   MRN: [id number] Advanced Heart Failure Rounding Note  SUBJECTIVE: Weight continues to go down with HD.  Still with occasional runs of atrial fibrillation on telemetry (NSR) currently but not having symptomatic palpitations now.     Marland Kitchen amiodarone  400 mg Oral BID  . atorvastatin  20 mg Oral q1800  . cephALEXin  250 mg Oral Q2200  . doxercalciferol  2 mcg Intravenous Q T,Th,Sa-HD  . febuxostat  80 mg Oral Daily  . ferric gluconate (FERRLECIT/NULECIT) IV  125 mg Intravenous Q T,Th,Sa-HD  . insulin aspart  0-9 Units Subcutaneous TID WC  . insulin aspart  2 Units Subcutaneous TID WC  . insulin glargine  19 Units Subcutaneous QHS  . metoprolol succinate  25 mg Oral BID  . multivitamin  1 tablet Oral QHS  . pantoprazole  40 mg Oral Daily  . sodium chloride  3 mL Intravenous Q12H  . warfarin  7.5 mg Oral ONCE-1800  . Warfarin - Pharmacist Dosing Inpatient   Does not apply q1800  bivalirudin gtt   Filed Vitals:   09/23/12 1315 09/23/12 1758 09/23/12 2131 09/24/12 0512  BP: 108/57 94/61 103/52 108/75  Pulse: 77 79 81 75  Temp: 98.5 F (36.9 C) 98.6 F (37 C) 98.2 F (36.8 C) 98.3 F (36.8 C)  TempSrc: Oral Oral Oral Oral  Resp: 20 19 21 20   Height:   6\' 1"  (1.854 m)   Weight:   153 kg (337 lb 4.9 oz)   SpO2: 95% 97% 95% 93%    Intake/Output Summary (Last 24 hours) at 09/24/12 0903 Last data filed at 09/24/12 0900  Gross per 24 hour  Intake 1504.75 ml  Output   4800 ml  Net -3295.25 ml    LABS: Basic Metabolic Panel:  Recent Labs  40/98/11 0504 09/24/12 0455  NA 138 136  K 3.6 3.3*  CL 99 94*  CO2 26 26  GLUCOSE 115* 123*  BUN 25* 17  CREATININE 4.19* 3.34*  CALCIUM 9.0 9.3  MG 1.9 2.0  PHOS 3.6 3.3   Liver Function Tests:  Recent Labs  09/23/12 0504 09/24/12 0455  ALBUMIN 3.0* 3.1*   No results found for this basename: LIPASE, AMYLASE,  in the last 72 hours CBC:  Recent Labs  09/23/12 0504  09/24/12 0455  WBC 8.5 9.7  HGB 8.1* 8.5*  HCT 26.8* 28.7*  MCV 90.8 92.9  PLT 228 255   Cardiac Enzymes: No results found for this basename: CKTOTAL, CKMB, CKMBINDEX, TROPONINI,  in the last 72 hours BNP: No components found with this basename: POCBNP,  D-Dimer: No results found for this basename: DDIMER,  in the last 72 hours Hemoglobin A1C: No results found for this basename: HGBA1C,  in the last 72 hours Fasting Lipid Panel: No results found for this basename: CHOL, HDL, LDLCALC, TRIG, CHOLHDL, LDLDIRECT,  in the last 72 hours Thyroid Function Tests: No results found for this basename: TSH, T4TOTAL, FREET3, T3FREE, THYROIDAB,  in the last 72 hours Anemia Panel: No results found for this basename: VITAMINB12, FOLATE, FERRITIN, TIBC, IRON, RETICCTPCT,  in the last 72 hours  RADIOLOGY: Dg Chest 2 View  08/26/2012   *RADIOLOGY REPORT*  Clinical Data: Preop for dialysis catheter insertion  CHEST - 2 VIEW  Comparison: 10/28/2011  Findings: Cardiomegaly again noted.  Single lead cardiac pacemaker is unchanged in position.  No acute infiltrate or pleural effusion. No pulmonary edema.  Bony  thorax is stable.  IMPRESSION:  No active disease.  Cardiomegaly again noted.  Single lead cardiac pacemaker in place.   Original Report Authenticated By: Natasha Mead, M.D.   Dg Chest Port 1 View  09/10/2012   *RADIOLOGY REPORT*  Clinical Data: Lower extremity swelling.  Weakness and dizziness.  PORTABLE CHEST - 1 VIEW  Comparison: 08/31/2012.  Findings: The pacer wire / AICD is stable.  The dialysis catheter is unchanged.  The heart is enlarged and there is vascular congestion and pulmonary edema.  No definite pleural effusion.  IMPRESSION: Cardiac enlargement and pulmonary edema suggesting CHF.  No definite pleural effusion.   Original Report Authenticated By: Rudie Meyer, M.D.   Dg Chest Port 1 View  08/31/2012   *RADIOLOGY REPORT*  Clinical Data: Status post hemodialysis catheter placement  PORTABLE  CHEST - 1 VIEW  Comparison: Prior chest x-ray 08/26/2012  Findings: Interval placement of a right IJ approach tunneled hemodialysis catheter.  The catheter tips project over the superior cavoatrial junction.  No evidence of pneumothorax.  Enlargement the cardiopericardial silhouette with pulmonary vascular congestion and mild interstitial edema.  Stable left subclavian approach cardiac rhythm maintenance device with the single lead projecting over the right ventricle.  No acute osseous abnormality.  IMPRESSION:  1.  Right IJ approach tunneled hemodialysis catheter with the tips projecting over the superior cavoatrial junction. 2.  Cardiomegaly with mild CHF versus volume overload.   Original Report Authenticated By: Malachy Moan, M.D.   Dg Foot Complete Left  09/12/2012   *RADIOLOGY REPORT*  Clinical Data: Left toe infection, left dorsal foot ulcer for 1 month, rule out osteomyelitis left big toe  LEFT FOOT - COMPLETE 3+ VIEW  Comparison: None.  Findings: Extensive small vessel calcification.  No fracture dislocation.  No periosteal reaction or cortical destruction.  IMPRESSION: No evidence for osteomyelitis.   Original Report Authenticated By: Esperanza Heir, M.D.   Dg Fluoro Guide Cv Line-no Report  08/31/2012   CLINICAL DATA: Dialysis Catheter Placement   FLOURO GUIDE CV LINE  Fluoroscopy was utilized by the requesting physician.  No radiographic  interpretation.     PHYSICAL EXAM General: NAD Neck: JVP 12 cm, no thyromegaly or thyroid nodule.  Lungs: Clear to auscultation bilaterally with normal respiratory effort. CV: Nondisplaced PMI.  Heart regular S1/S2, no S3/S4, no murmur.  Legs wrapped with 1+ edema to knees.   Abdomen: Soft, nontender, no hepatosplenomegaly, no distention.  Neurologic: Alert and oriented x 3.  Psych: Normal affect. Extremities: No clubbing or cyanosis.   TELEMETRY: Reviewed telemetry pt in NSR, runs of atrial fibrillation with RVR last night.   ASSESSMENT AND  PLAN: 47 yo with HTN/DM, nonischemic cardiomyopathy, and baseline severe CKD was admitted with acute on chronic systolic CHF and progression to ESRD.  1. CHF: Acute on chronic systolic. Nonischemic cardiomyopathy with last EF 25%. Etiology uncertain: No ETOH or drug abuse, normal coronaries in 2006 (after CMP identified). Cannot rule out familial CMP, father died at 79 of uncertain cause. 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 He remains volume overloaded. Blood pressure is soft, with SBP in 100s. This will limit medication titration, especially as he will be getting dialysis.  - Ongoing HD for fluid removal.  He has a lot of volume but removal is going to have to be gradual.  Next HD session tomorrow.  - Continue Toprol XL.  - For now, hold off on ACEI, Bidil given lack of BP room.  2. Atrial arrhythmias: Atrial fibrillation and possibly  flutter have been noted paroxysmally.  BP drops when in atrial fibrillation with RVR.  He still has runs of atrial fibrillation on telemetry after initiation of amiodarone but is less symptomatic.   - Has tolerated amiodarone at 200 mg bid, will try increasing to 400 mg bid (BP a bit higher today) to see if we can load him faster.  - He will continue bivalirudin overlap to coumadin per pharmacy. Will continue bivalirudin until INR is 2.5 or above. 3. ESRD: Per renal. Needs ongoing fluid removal.  4. NSVT: Follow, no prolonged events by ICD interrogation.  5. 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  SRA is gold standard but HIT antibody level was very high.  Awaiting hematology input to sort this out.   Marca Ancona 09/24/2012

## 2012-09-24 NOTE — Progress Notes (Signed)
Patient had a 9 beat run of V-tach, noted on telemetry. Upon assessment, patient was sitting on side of bed eating breakfast. He was asymptomatic. Dr. Donnetta Hutching team was notified. Will continue to monitor patient closely.

## 2012-09-24 NOTE — Progress Notes (Signed)
Utilization review completed.  

## 2012-09-24 NOTE — Consult Note (Signed)
WOC follow up Wound type: Venous stasis dermatitis with one open area on the left dorsal foot.  He does not have any weeping otherwise. Hemosiderin staining bilateral with improving edema. Nephrology at the bedside during compression wraps being placed Measurement: 1.0cm x 1.5cm dorsal left foot Wound bed: 90% yellow Drainage (amount, consistency, odor) minimal serous, no odor Periwound: intact Dressing procedure/placement/frequency: silicone foam dressing placed over left dorsal foot wound. Bilateral 4-layer compression wraps placed, pt would not allow me to place as much compression this time. Will need compression wraps change 2x wk alternate days from his HD days.  Will need medical MD to at some point have patient fitted for long term compression stockings for management of LE edema.  WOC team will follow along with you compression tx.   Florine Sprenkle [place] RN,CWOCN [phone]

## 2012-09-24 NOTE — Progress Notes (Addendum)
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 HD outpatient at [facility] . Will do 5 hours due to his size and instability with Txs. Will plan 3 days per week treatments, 5 hrs each. Need formal opinion from Hematology regarding ability to use heparin on dialysis.  Addendum: I spoke with Dr. Lawernce Keas who states if repeat SRA test negative would be able to give heparin.    Subjective: Interval History: 4800 cc removed with HD yesterday  Objective: Vital signs in last 24 hours: Temp:  [98.2 F (36.8 C)-98.6 F (37 C)] 98.6 F (37 C) (08/29 0944) Pulse Rate:  [75-101] 101 (08/29 0944) Resp:  [18-23] 18 (08/29 0944) BP: (91-108)/(52-75) 91/60 mmHg (08/29 0944) SpO2:  [93 %-97 %] 97 % (08/29 0944) Weight:  [145.7 kg (321 lb 3.4 oz)-153 kg (337 lb 4.9 oz)] 153 kg (337 lb 4.9 oz) (08/28 2131) Weight change: -5 kg (-11 lb 0.4 oz)  Intake/Output from previous day: 08/28 0701 - 08/29 0700 In: 1384.8 [P.O.:600; I.V.:344.8; IV Piggyback:440] Out: 4800  Intake/Output this shift: Total I/O In: 600 [P.O.:600] Out: -   General appearance: alert and cooperative Resp: clear to auscultation bilaterally Chest wall: no tenderness Cardio: irregular rate and rhythm, S1, S2 normal, no murmur, click, rub or gallop Extremities: edema less edema, being wrapped currently and are improved  Lab Results:  Recent Labs  09/23/12 0504 09/24/12 0455  WBC 8.5 9.7  HGB 8.1* 8.5*  HCT 26.8* 28.7*  PLT 228 255   BMET:  Recent Labs  09/23/12 0504 09/24/12 0455  NA 138 136  K 3.6 3.3*  CL 99 94*  CO2 26 26  GLUCOSE 115* 123*  BUN 25* 17  CREATININE 4.19* 3.34*  CALCIUM 9.0 9.3   No results found for this basename: PTH,  in the last 72 hours Iron Studies: No results found for this basename: IRON, TIBC, TRANSFERRIN, FERRITIN,  in the last 72  hours Studies/Results: No results found.  Scheduled: . amiodarone  400 mg Oral BID  . atorvastatin  20 mg Oral q1800  . cephALEXin  250 mg Oral Q2200  . doxercalciferol  2 mcg Intravenous Q T,Th,Sa-HD  . febuxostat  80 mg Oral Daily  . ferric gluconate (FERRLECIT/NULECIT) IV  125 mg Intravenous Q T,Th,Sa-HD  . insulin aspart  0-9 Units Subcutaneous TID WC  . insulin aspart  2 Units Subcutaneous TID WC  . insulin glargine  19 Units Subcutaneous QHS  . metoprolol succinate  25 mg Oral BID  . multivitamin  1 tablet Oral QHS  . pantoprazole  40 mg Oral Daily  . sodium chloride  3 mL Intravenous Q12H  . warfarin  7.5 mg Oral ONCE-1800  . Warfarin - Pharmacist Dosing Inpatient   Does not apply q1800    LOS: 15 days   Arrie Zuercher C 09/24/2012,11:13 AM

## 2012-09-24 NOTE — Progress Notes (Signed)
ANTICOAGULATION CONSULT NOTE - Follow Up Consult  Pharmacy Consult for Angiomax + Warfarin Indication: New onset Afib, r/o HIT  Allergies  Allergen Reactions  . Argatroban Other (See Comments)    Ventricular tachycardia  . Ace Inhibitors     Acute renal failure with multiple trials  . Heparin     ? HIT    Patient Measurements: Height: 6\' 1"  (185.4 cm) Weight: 337 lb 4.9 oz (153 kg) IBW/kg (Calculated) : 79.9 Heparin Dosing Weight: 115 kg  Vital Signs: Temp: 98.3 F (36.8 C) (08/29 0512) Temp src: Oral (08/29 0512) BP: 108/75 mmHg (08/29 0512) Pulse Rate: 75 (08/29 0512)  Labs:  Recent Labs  09/22/12 0400 09/23/12 0504 09/23/12 1750 09/24/12 0455  HGB 7.8* 8.1*  --  8.5*  HCT 26.2* 26.8*  --  28.7*  PLT 180 228  --  255  APTT 88* 86* 72* 77*  LABPROT 21.9* 21.6*  --  23.1*  INR 1.98* 1.94*  --  2.12*  CREATININE 3.08* 4.19*  --  3.34*    Estimated Creatinine Clearance: 41.7 ml/min (by C-G formula based on Cr of 3.34).   Assessment: 47 y.o. M with complicated PMH including ESRD, new onset Afib and heart failure. The patient was started receiving heparin SQ for VTE px and with hemodilaysis starting on 8/14. On 8/18, the patient was switched to IV heparin for new-onset Afib. Platelets dropped >50% by 8/22 and a HIT panel was sent off. The patient was started on argatroban and on 8/23 experienced VT episodes, QT noted to be prolonged. Pharmacy consulted asked to switch to bivalirudin in light of the rare incidence of argatroban causing VT and the patients notably elevated aPTT despite multiple aggressive rate decreases.   The HIT panel results have come back today showing antibody positive and SRA negative. The antibody test has high sensitivity (~95%) and low specificity (60-80%). SAR is considered the *gold standard* with both a high sensitivity (88-100%) and specificity (89-100%). Additional testing report the optical density to be 2.694 which increases the chance  that this is true HIT despite the SRA being negative and the IM team wishes to consult hematology.   Angiomax is  turned off during HD sessisons as it is running through the HD port and the patient has no other access.  INR today remains SUBtherapeutic (2.12<1.94 << 1.98, goal of 2-3) -- although it is noted that angiomax can falsely elevate INR and we will need to shoot for a higher INR goal of > 2.5 while on concurrent treatment. Hgb/Hct remains low but stable (hx anemia). Plts continue trending up, 255< 228 <<180. Amiodarone started today which will increase warfarin sensitivity.  Will follow-up hematology consult for determinate of true heparin allergy. If deemed likely HIT, will need to enter heparin allergy.  Goal of Therapy:  aPTT 50-85 seconds Monitor platelets by anticoagulation protocol: Yes Goal INR: Consider goal INR of > 2.5 (on coumadin and bival)  Plan:  1. Continue Bivalirudin at  rate of 0.02 mcg/kg/hr (5.8 ml/hr) 2. Repeat Warfarin 7.5 mg x 1 dose at 1800 today 3. Will continue to monitor for any signs/symptoms of bleeding and will follow up with aPTT 2 hours after drip rate initiated and PT/INR in the a.m.   Thank you. Okey Regal, PharmD [phone]  09/24/2012 8:26 AM

## 2012-09-24 NOTE — Progress Notes (Addendum)
Subjective:  Pt seen and examined.  Reports flutter in his chest. Patient denies CP, dyspnea, and palpitations at this time. No nausea, vomiting, or abdominal pain. Normal BM and urination.      Objective: Vital signs in last 24 hours: Filed Vitals:   09/24/12 0944 09/24/12 1347 09/24/12 1534 09/24/12 1535  BP: 91/60 100/59    Pulse: 101 89    Temp: 98.6 F (37 C) 97.5 F (36.4 C)    TempSrc: Oral Oral    Resp: 18 20 24 20   Height:      Weight:      SpO2: 97% 100%     Weight change: -5 kg (-11 lb 0.4 oz)  Intake/Output Summary (Last 24 hours) at 09/24/12 1611 Last data filed at 09/24/12 1300  Gross per 24 hour  Intake 2104.75 ml  Output      0 ml  Net 2104.75 ml   Constitutional: Obese, NAD Head: Normocephalic Eyes: PERRL Neck: Supple Cardiovascular:  RRR, S1 normal, S2 normal, pitting +2 edema b/l from thighs to abdomen.  Pulmonary/Chest: Air entry equal bilaterally. Clear to auscultation bilaterally Abdominal: Soft. Non-tender, non-distended, bowel sounds are normal, no masses, organomegaly, or guarding present.  Musculoskeletal: No synovitis, effusions, or erythema of bilateral upper and lower  appendicular joints  Neurological: A&O x3 Skin: both LE wrapped    Psych: depressed mood    Lab Results: Basic Metabolic Panel:  Recent Labs Lab 09/23/12 0504 09/24/12 0455  NA 138 136  K 3.6 3.3*  CL 99 94*  CO2 26 26  GLUCOSE 115* 123*  BUN 25* 17  CREATININE 4.19* 3.34*  CALCIUM 9.0 9.3  MG 1.9 2.0  PHOS 3.6 3.3   Liver Function Tests:  Recent Labs Lab 09/18/12 1347  09/23/12 0504 09/24/12 0455  AST 13  --   --   --   ALT <5  --   --   --   ALKPHOS 119*  --   --   --   BILITOT 3.2*  --   --   --   PROT 7.5  --   --   --   ALBUMIN 3.0*  < > 3.0* 3.1*  < > = values in this interval not displayed. No results found for this basename: LIPASE, AMYLASE,  in the last 168 hours No results found for this basename: AMMONIA,  in the last 168  hours CBC:  Recent Labs Lab 09/23/12 0504 09/24/12 0455  WBC 8.5 9.7  HGB 8.1* 8.5*  HCT 26.8* 28.7*  MCV 90.8 92.9  PLT 228 255   Cardiac Enzymes:  Recent Labs Lab 09/18/12 1347 09/18/12 1909 09/19/12 0200  TROPONINI <0.30 <0.30 <0.30   BNP: No results found for this basename: PROBNP,  in the last 168 hours D-Dimer: No results found for this basename: DDIMER,  in the last 168 hours CBG:  Recent Labs Lab 09/23/12 0346 09/23/12 1324 09/23/12 1653 09/23/12 2127 09/24/12 0738 09/24/12 1127  GLUCAP 113* 114* 160* 111* 107* 118*   Hemoglobin A1C: No results found for this basename: HGBA1C,  in the last 168 hours Fasting Lipid Panel: No results found for this basename: CHOL, HDL, LDLCALC, TRIG, CHOLHDL, LDLDIRECT,  in the last 168 hours Thyroid Function Tests: No results found for this basename: TSH, T4TOTAL, FREET4, T3FREE, THYROIDAB,  in the last 168 hours Coagulation:  Recent Labs Lab 09/21/12 0440 09/22/12 0400 09/23/12 0504 09/24/12 0455  LABPROT 20.9* 21.9* 21.6* 23.1*  INR 1.86* 1.98*  1.94* 2.12*   Anemia Panel:  Recent Labs Lab 09/18/12 2220  VITAMINB12 728  FOLATE 12.8  FERRITIN 108  RETICCTPCT 3.2*   Urine Drug Screen: Drugs of Abuse     Component Value Date/Time   LABOPIA NEG 03/17/2006 1455   COCAINSCRNUR NEG 03/17/2006 1455   LABBENZ NEG 03/17/2006 1455   AMPHETMU NEG 03/17/2006 1455    Alcohol Level: No results found for this basename: ETH,  in the last 168 hours Urinalysis: No results found for this basename: COLORURINE, APPERANCEUR, LABSPEC, PHURINE, GLUCOSEU, HGBUR, BILIRUBINUR, KETONESUR, PROTEINUR, UROBILINOGEN, NITRITE, LEUKOCYTESUR,  in the last 168 hours Misc. Labs:   Micro Results: Recent Results (from the past 240 hour(s))  MRSA PCR SCREENING     Status: None   Collection Time    09/18/12  7:47 PM      Result Value Range Status   MRSA by PCR NEGATIVE  NEGATIVE Final   Comment:            The GeneXpert MRSA Assay  (FDA     approved for NASAL specimens     only), is one component of a     comprehensive MRSA colonization     surveillance program. It is not     intended to diagnose MRSA     infection nor to guide or     monitor treatment for     MRSA infections.   Studies/Results: No results found. Medications: I have reviewed the patient's current medications. Scheduled Meds: . amiodarone  400 mg Oral BID  . atorvastatin  20 mg Oral q1800  . cephALEXin  250 mg Oral Q2200  . [START ON 09/25/2012] darbepoetin (ARANESP) injection - DIALYSIS  150 mcg Intravenous Q Sat-HD  . doxercalciferol  2 mcg Intravenous Q T,Th,Sa-HD  . febuxostat  80 mg Oral Daily  . ferric gluconate (FERRLECIT/NULECIT) IV  125 mg Intravenous Q T,Th,Sa-HD  . insulin aspart  0-9 Units Subcutaneous TID WC  . insulin aspart  2 Units Subcutaneous TID WC  . insulin glargine  19 Units Subcutaneous QHS  . metoprolol succinate  25 mg Oral BID  . multivitamin  1 tablet Oral QHS  . pantoprazole  40 mg Oral Daily  . sodium chloride  3 mL Intravenous Q12H  . warfarin  7.5 mg Oral ONCE-1800  . Warfarin - Pharmacist Dosing Inpatient   Does not apply q1800   Continuous Infusions: . sodium chloride 13 mL/hr at 09/22/12 0700  . bivalirudin (ANGIOMAX) infusion 0.5 mg/mL (Non-ACS indications) 0.02 mg/kg/hr (09/23/12 1523)   PRN Meds:.sodium chloride, sodium chloride, sodium chloride, acetaminophen, diclofenac sodium, docusate sodium, feeding supplement (NEPRO CARB STEADY), lidocaine (PF), lidocaine-prilocaine, LORazepam, pentafluoroprop-tetrafluoroeth, sodium chloride Assessment/Plan: Principal Problem:   End stage renal disease Active Problems:   Type II or unspecified type diabetes mellitus with renal manifestations, uncontrolled(250.42)   Hypopotassemia   Morbid obesity   ANXIETY   TOBACCO ABUSE   OBSTRUCTIVE SLEEP APNEA   HYPERTENSION   CARDIOMYOPATHY   Chronic systolic heart failure   GERD   Leg swelling   ICD (implantable  cardioverter-defibrillator), single, in situ; [employer]   Normocytic anemia   Open wound of foot    #Thrombocytopenia - resolved s/p discontinuing heparin. 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 IV Heparin started on 8/18 with thrombocytopenia on 8/19, stopped on 8.22.  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 -Per hematology Dr. Roanna Raider - To repeat SRA (miscellaneous test to result Saturday in PM), NO heparin, continue angiomax and coumadin until INR 2-3 (2.5 per cardiology), then coumadin     - Per Dr.  Lowell Guitar note - Spoke with Dr. Lawernce Keas who states if repeat SRA test negative would be able to give heparin. INR currently 2.12 -Due to epsiodes of Vtach, argatraban that was started on 8/23 stopped on 8/24 due to risk of  cardiac arrest (6%), ventricular tachycardia (5%), bradycardia (5%), myocardial infarction (PCI: 4%), atrial fibrillation (3%), angina (2%)  -Angiomax per pharmacy until INR therapeutic, per last pharmacy note >/= 2.5, INR today 1.94  -On coumadin since  8/25 - 8/24 DUS of bilateral extremities to r/o DVT - no DVT or SVT in bilateral extremities  -Monitor for bleeding   # Atrial Fibrallation - new onset in setting of volume overload (ESRD and CHF)  -No firing of AICD - no interrogation needed per cards - Cardiology consult - rate control for now, cardioversion if needed  -Heart Failure consult - Transition metoprolol to Toprol XL 50mg  BID, no events per interrogation of device  -On coumadin 8/25 for [facility]  -12-lead EKG 8/27 - episodes of atrial tach and PAF- continue current medications  -Per Dr. Shirlee Latch - amiodarone 200 to 400 mg bid to try to keep him in NSR, &decrease Toprol XL to 25 mg bid given initiation of amiodarone    # ESRD with  severe volume overload refractory to diuretic therapy necessitating emergent HD, with right arm AV cimino fistula and perm cath  -HD 8/14 -  tolerated well, 4L off, episode of hypotension and tachycardia - asymptomatic -HD 8/16 -  3L off  -HD 8/18 -  4L off -HD 8/20 - tacyhcardia,  2.9L off -HD 8/21-  Erratic HR, bp low 100s,  2.9L off  -HD 8/23 - 2.7L off -HD 8/25 - 3.2L -HD 8/26 - 2.7L , tachycardia 140-150's, UF was turned off -HD 8/28 - 4.8L -Carb restricted diet with 1.0L fluid restriction  -Continue daily weight - 363 to 337 wt loss since admission -Monitor for hyperkalemia - currently hypokalemia  -PT - home health PT recommended -OT evaluation - needs home health OT, 3 in 1 bedside comode;Other (comment);Tub/shower bench (needs wide BSC and RW for over 300#. Needs [employer] tub bench -Pt to have advanced home health care -Pt to call medicaid transportation for options regarding transportation services to HD  -Per Dr. Lowell Guitar next HD Saturday on schedule. HD outpatient at [facility] . Will do 5 hours due to his size and instability with Txs. Will plan 3 days per week treatments, 5 hrs each.   #Dyspnea - stable, present with exertion, not at rest.  Patient with h/o of CHF, anemia, and CKD with ongoing dyspnea for past month due to fluid overload requiring emergent hemodialysis. Also with acute on chronic systolic/ diastolic failure. Since admission, patient has had several episodes of  ventricular tachycardia with atrial fibrillation necessitating AC therapy with heparin drip on 8/18.  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 Argatraban was stopped due to episodes of tachycardia and known cardiac side effects. Instead angiomax started. VT most likely due to new onset atrial fibrillation in setting of CHF exacerbation. Doppler US of bilateral lower extremity with no evidence  of clot and patient with  no leg pain. Patient with Geneva score of 3.0 points,  indicating low risk for PE 7-9% incidence. Patient has been anitcoagulated since 8/18 making pulmonary embolus less likely. He is presently not dyspneic, tachypneic, or tachycardic and normal oxygen saturation on 2-3L.     -Do not recommend V/Q scan - if low/intermediate probability will not exclude PE. If normal would exclude PE but in setting of vascular congestion and pulmonary edema will most likely not be normal.        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 Left great toe with what appears to be an infected paronychia, recommend I & D of paronychia and antibiotics -Obtain Xray left foot  - no osteomyelitis  -Obtain ABI - R 1.41 L 1.39 - suggesting calcified vessels, waveforms of b/l LE WNL -Ortho consult - Per Dr. Eulah Pont no surgical debridement at this time, continue wound care, antibiotics - keflex 250mg  daily on day 9/10     - Per Dr. Lajoyce Corners - wound care nurses to apply a Profore wrap 4 both lower extremities. To be d/c with compression wraps and f/u in office for wound care -DUS bilateral LE - no DVT or SVT in bilateral lower extremities    -Wound care 8/29 - Will need compression wraps change 2x wk alternate days  from his HD days. Will need medical MD to at some point have patient fitted for long term compression stockings for management of LE edema.   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 Nausea w/o vomiting or  abdominal pain, normal BM, likely due  to HD and electrolyte/fluid shift;  started 8/15, 1 episode of green emesis on 8/16 and again 8/17 after eating   -Zofran as needed for nausea  #Somnolence - resolved. Pt reports increased sleepiness after starting HD  --ABG on 8/15- hypoxia, hypocapnia, low bicarbonate - D/c narcotics    # Prophylaxis : Angiomax & coumadin      Dispo: Disposition is deferred at this time, awaiting improvement of current medical problems.  Anticipated discharge in approximately  day(s).   The patient does have a current PCP Lars Masson, MD) and does need an [facility] hospital follow-up appointment after discharge.  The patient does have transportation limitations that hinder transportation to clinic  appointments.  .Services Needed at time of discharge: Y = Yes, Blank = No PT:   OT:   RN:   Equipment:   Other:     LOS: 15 days   Otis Brace, MD 09/24/2012, 4:11 PM

## 2012-09-25 LAB — RENAL FUNCTION PANEL
CO2: 24 mEq/L (ref 19–32)
Calcium: 8.8 mg/dL (ref 8.4–10.5)
Creatinine, Ser: 4.18 mg/dL — ABNORMAL HIGH (ref 0.50–1.35)
GFR calc Af Amer: 18 mL/min — ABNORMAL LOW (ref >90)
Glucose, Bld: 150 mg/dL — ABNORMAL HIGH (ref 70–99)
Phosphorus: 4.3 mg/dL (ref 2.3–4.6)
Sodium: 135 mEq/L (ref 135–145)

## 2012-09-25 LAB — GLUCOSE, CAPILLARY
Glucose-Capillary: 113 mg/dL — ABNORMAL HIGH (ref 70–99)
Glucose-Capillary: 160 mg/dL — ABNORMAL HIGH (ref 70–99)
Glucose-Capillary: 157 mg/dL — ABNORMAL HIGH (ref 70–99)

## 2012-09-25 LAB — PROTIME-INR: Prothrombin Time: 30.1 seconds — ABNORMAL HIGH (ref 11.6–15.2)

## 2012-09-25 LAB — CBC
Platelets: 264 10*3/uL (ref 150–400)
RDW: 19.5 % — ABNORMAL HIGH (ref 11.5–15.5)
WBC: 8.7 10*3/uL (ref 4.0–10.5)

## 2012-09-25 LAB — MAGNESIUM: Magnesium: 1.8 mg/dL (ref 1.5–2.5)

## 2012-09-25 LAB — APTT: aPTT: >200 seconds (ref 24–37)

## 2012-09-25 MED ORDER — DARBEPOETIN ALFA-POLYSORBATE 150 MCG/0.3ML IJ SOLN: 150.0000 ug | INTRAMUSCULAR | Status: DC

## 2012-09-25 MED ORDER — WARFARIN SODIUM 7.5 MG PO TABS
7.5000 mg | ORAL_TABLET | Freq: Once | ORAL | Status: AC
  Administered 2012-09-25: 7.5 mg via ORAL
  Filled 2012-09-25: qty 1

## 2012-09-25 MED ORDER — DOXERCALCIFEROL 4 MCG/2ML IV SOLN
INTRAVENOUS | Status: AC
  Administered 2012-09-25: 2 ug via INTRAVENOUS
  Filled 2012-09-25: qty 2

## 2012-09-25 NOTE — Progress Notes (Signed)
Occupational Therapy Treatment Patient Details Name: Adam [name] MRN: [id number] DOB: [birth year] Today's Date: 09/25/2012 Time: 4098-1191 OT Time Calculation (min): 10 min  OT Assessment / Plan / Recommendation  History of present illness Pt adm to [facility] due to 50lb weight gain in less than a month, pt admits to increased SOB recenty and Lt LE pain/swelling from fluid retention. Pt with unctrolled diabetes and cardiomyopathy.  Currently with UNA boots on Bil LEs   OT comments  Pt making progress.  Follow Up Recommendations  Home health OT       Equipment Recommendations  3 in 1 bedside comode;Tub/shower bench       Frequency Min 2X/week   Progress towards OT Goals Progress towards OT goals: Progressing toward goals  Plan Discharge plan remains appropriate    Precautions / Restrictions Precautions Precautions: None Restrictions Weight Bearing Restrictions: No       ADL  ADL Comments: Went over AE with patient. He reports that toileting hygiene is not an issue for him. He was able to use the long shoe horn to don his shoes and then use the reacher to pull the velcro strap across his shoe. Issued him these two pieces of equipment and a hong handled sponge. Showed him how the sock aid works after his UNA boots are removed, but he was not interested.      OT Goals(current goals can now be found in the care plan section)    Visit Information  Last OT Received On: 09/25/12 Assistance Needed: +1 History of Present Illness: Pt adm to [facility] due to 50lb weight gain in less than a month, pt admits to increased SOB recenty and Lt LE pain/swelling from fluid retention. Pt with unctrolled diabetes and cardiomyopathy.  Currently with UNA boots on Bil LEs          Cognition  Cognition Arousal/Alertness: Awake/alert Behavior During Therapy: WFL for tasks assessed/performed Overall Cognitive Status: Within Functional Limits for tasks assessed    Mobility  Transfers Transfers: Sit to  Stand;Stand to Sit Sit to Stand: 7: Independent;With upper extremity assist;From bed Stand to Sit: 7: Independent;With upper extremity assist;To bed          End of Session OT - End of Session Activity Tolerance: Patient tolerated treatment well Patient left: with call bell/phone within reach (sitting EOB)       Evette Georges [phone] 09/25/2012, 5:37 PM

## 2012-09-25 NOTE — Progress Notes (Signed)
Patient ID: Adam [name], male   DOB: 1965-08-12, 47 y.o.   MRN: [id number] Advanced Heart Failure Rounding Note  SUBJECTIVE:   In HD. Got 5 more liters off today. Feels good. Wants to go home. Denies bleeding, orthopnea or PND.  INR therapeutic. In SR in HD,.   Marland Kitchen amiodarone  400 mg Oral BID  . atorvastatin  20 mg Oral q1800  . darbepoetin (ARANESP) injection - DIALYSIS  150 mcg Intravenous Q Sat-HD  . doxercalciferol  2 mcg Intravenous Q T,Th,Sa-HD  . febuxostat  80 mg Oral Daily  . ferric gluconate (FERRLECIT/NULECIT) IV  125 mg Intravenous Q T,Th,Sa-HD  . insulin aspart  0-9 Units Subcutaneous TID WC  . insulin aspart  2 Units Subcutaneous TID WC  . insulin glargine  19 Units Subcutaneous QHS  . metoprolol succinate  25 mg Oral BID  . multivitamin  1 tablet Oral QHS  . pantoprazole  40 mg Oral Daily  . sodium chloride  3 mL Intravenous Q12H  . Warfarin - Pharmacist Dosing Inpatient   Does not apply q1800  \   Filed Vitals:   09/25/12 1000 09/25/12 1030 09/25/12 1100 09/25/12 1130  BP: 105/52 102/54 108/60 108/60  Pulse: 70 68 70 76  Temp:      TempSrc:      Resp: 27 24 14 19   Height:      Weight:      SpO2:        Intake/Output Summary (Last 24 hours) at 09/25/12 1150 Last data filed at 09/25/12 0700  Gross per 24 hour  Intake 1046.4 ml  Output      0 ml  Net 1046.4 ml    LABS: Basic Metabolic Panel:  Recent Labs  40/98/11 0455 09/25/12 0500  NA 136 135  K 3.3* 3.2*  CL 94* 95*  CO2 26 24  GLUCOSE 123* 150*  BUN 17 27*  CREATININE 3.34* 4.18*  CALCIUM 9.3 8.8  MG 2.0 1.8  PHOS 3.3 4.3   Liver Function Tests:  Recent Labs  09/24/12 0455 09/25/12 0500  ALBUMIN 3.1* 2.9*   No results found for this basename: LIPASE, AMYLASE,  in the last 72 hours CBC:  Recent Labs  09/24/12 0455 09/25/12 0500  WBC 9.7 8.7  HGB 8.5* 7.9*  HCT 28.7* 25.8*  MCV 92.9 90.5  PLT 255 264   Cardiac Enzymes: No results found for this basename: CKTOTAL, CKMB,  CKMBINDEX, TROPONINI,  in the last 72 hours BNP: No components found with this basename: POCBNP,  D-Dimer: No results found for this basename: DDIMER,  in the last 72 hours Hemoglobin A1C: No results found for this basename: HGBA1C,  in the last 72 hours Fasting Lipid Panel: No results found for this basename: CHOL, HDL, LDLCALC, TRIG, CHOLHDL, LDLDIRECT,  in the last 72 hours Thyroid Function Tests: No results found for this basename: TSH, T4TOTAL, FREET3, T3FREE, THYROIDAB,  in the last 72 hours Anemia Panel: No results found for this basename: VITAMINB12, FOLATE, FERRITIN, TIBC, IRON, RETICCTPCT,  in the last 72 hours  RADIOLOGY: Dg Chest 2 View  08/26/2012   *RADIOLOGY REPORT*  Clinical Data: Preop for dialysis catheter insertion  CHEST - 2 VIEW  Comparison: 10/28/2011  Findings: Cardiomegaly again noted.  Single lead cardiac pacemaker is unchanged in position.  No acute infiltrate or pleural effusion. No pulmonary edema.  Bony thorax is stable.  IMPRESSION:  No active disease.  Cardiomegaly again noted.  Single lead cardiac pacemaker in place.  Original Report Authenticated By: Natasha Mead, M.D.   Dg Chest Port 1 View  09/10/2012   *RADIOLOGY REPORT*  Clinical Data: Lower extremity swelling.  Weakness and dizziness.  PORTABLE CHEST - 1 VIEW  Comparison: 08/31/2012.  Findings: The pacer wire / AICD is stable.  The dialysis catheter is unchanged.  The heart is enlarged and there is vascular congestion and pulmonary edema.  No definite pleural effusion.  IMPRESSION: Cardiac enlargement and pulmonary edema suggesting CHF.  No definite pleural effusion.   Original Report Authenticated By: Rudie Meyer, M.D.   Dg Chest Port 1 View  08/31/2012   *RADIOLOGY REPORT*  Clinical Data: Status post hemodialysis catheter placement  PORTABLE CHEST - 1 VIEW  Comparison: Prior chest x-ray 08/26/2012  Findings: Interval placement of a right IJ approach tunneled hemodialysis catheter.  The catheter tips project  over the superior cavoatrial junction.  No evidence of pneumothorax.  Enlargement the cardiopericardial silhouette with pulmonary vascular congestion and mild interstitial edema.  Stable left subclavian approach cardiac rhythm maintenance device with the single lead projecting over the right ventricle.  No acute osseous abnormality.  IMPRESSION:  1.  Right IJ approach tunneled hemodialysis catheter with the tips projecting over the superior cavoatrial junction. 2.  Cardiomegaly with mild CHF versus volume overload.   Original Report Authenticated By: Malachy Moan, M.D.   Dg Foot Complete Left  09/12/2012   *RADIOLOGY REPORT*  Clinical Data: Left toe infection, left dorsal foot ulcer for 1 month, rule out osteomyelitis left big toe  LEFT FOOT - COMPLETE 3+ VIEW  Comparison: None.  Findings: Extensive small vessel calcification.  No fracture dislocation.  No periosteal reaction or cortical destruction.  IMPRESSION: No evidence for osteomyelitis.   Original Report Authenticated By: Esperanza Heir, M.D.   Dg Fluoro Guide Cv Line-no Report  08/31/2012   CLINICAL DATA: Dialysis Catheter Placement   FLOURO GUIDE CV LINE  Fluoroscopy was utilized by the requesting physician.  No radiographic  interpretation.     PHYSICAL EXAM General: NAD Neck: JVP 10 cm, no thyromegaly or thyroid nodule.  Lungs: Clear to auscultation bilaterally with normal respiratory effort. CV: Nondisplaced PMI.  Heart regular S1/S2, no S3/S4, no murmur.  Legs wrapped with mild edema to knees.   Abdomen:  Obese Soft, nontender, no hepatosplenomegaly, no distention.  Neurologic: Alert and oriented x 3.  Psych: Normal affect. Extremities: No clubbing or cyanosis.  TELEMETRY: Reviewed telemetry pt in NSR, runs of atrial fibrillation with RVR last night.   ASSESSMENT AND PLAN: 47 yo with HTN/DM, nonischemic cardiomyopathy, and baseline severe CKD was admitted with acute on chronic systolic CHF and progression to ESRD.  1. CHF:  Acute on chronic systolic. Nonischemic cardiomyopathy with last EF 25%. Etiology uncertain: No ETOH or drug abuse, normal coronaries in 2006 (after CMP identified). Cannot rule out familial CMP, father died at 72 of uncertain cause. 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 He remains volume overloaded. Blood pressure is soft, with SBP in 100s. This will limit medication titration, especially as he will be getting dialysis.  - Ongoing HD for fluid removal.  He has a lot of volume but removal is going to have to be gradual.  Next HD session tomorrow.  - Continue Toprol XL.  - For now, hold off on ACEI, Bidil given lack of BP room.  2. Atrial arrhythmias: Atrial fibrillation and possibly flutter have been noted paroxysmally.  BP drops when in atrial fibrillation with RVR.  He still has runs of atrial  fibrillation on telemetry after initiation of amiodarone but is less symptomatic.   - Has tolerated amiodarone at 200 mg bid, no on 400 mg bid  -INR therapeutic today can stop bival 3. ESRD: Per renal. Volume status improvng 4. NSVT: Follow, no prolonged events by ICD interrogation.  5. 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    Volume status looks much better. Likely ready for d/c home soon. Would continue amio 400 bid x 2 weeks then switch back to 200 bid. Volume status managed by HD.    Arvilla Meres 09/25/2012

## 2012-09-25 NOTE — Progress Notes (Signed)
CRITICAL VALUE ALERT  Critical value received:  PTT>200  Date of notification:  *09/25/12  Time of notification:  0820  Critical value read back yes  Nurse who received alert:  Dorisann Frames  MD notified (1st page):Komanski  Time of first page:[phone]  MD notified (2nd page):  Time of second page:  Responding MD:  Glendell Docker  Time MD responded: [phone]

## 2012-09-25 NOTE — Progress Notes (Signed)
ANTICOAGULATION CONSULT NOTE - Follow Up Consult  Pharmacy Consult for Angiomax + Warfarin Indication: New onset Afib, r/o HIT  Allergies  Allergen Reactions  . Argatroban Other (See Comments)    Ventricular tachycardia  . Ace Inhibitors     Acute renal failure with multiple trials  . Heparin     ? HIT    Patient Measurements: Height: 6\' 1"  (185.4 cm) Weight: 318 lb 12.6 oz (144.6 kg) IBW/kg (Calculated) : 79.9 Heparin Dosing Weight: 115 kg  Vital Signs: Temp: 98.8 F (37.1 C) (08/30 1151) Temp src: Oral (08/30 1151) BP: 124/63 mmHg (08/30 1151) Pulse Rate: 78 (08/30 1151)  Labs:  Recent Labs  09/23/12 0504 09/23/12 1750 09/24/12 0455 09/25/12 0500 09/25/12 1053  HGB 8.1*  --  8.5* 7.9*  --   HCT 26.8*  --  28.7* 25.8*  --   PLT 228  --  255 264  --   APTT 86* 72* 77* >200*  --   LABPROT 21.6*  --  23.1* 30.1* 22.8*  INR 1.94*  --  2.12* 3.00* 2.09*  CREATININE 4.19*  --  3.34* 4.18*  --     Estimated Creatinine Clearance: 32.3 ml/min (by C-G formula based on Cr of 4.18).   Assessment: 47 y.o. M with complicated PMH including ESRD, new onset Afib and heart failure. The patient was started receiving heparin SQ for VTE px and with hemodilaysis starting on 8/14. On 8/18, the patient was switched to IV heparin for new-onset Afib. Platelets dropped >50% by 8/22 and a HIT panel was sent off. The patient was started on argatroban and on 8/23 experienced VT episodes, QT noted to be prolonged. Pharmacy consulted asked to switch to bivalirudin in light of the rare incidence of argatroban causing VT and the patients notably elevated aPTT despite multiple aggressive rate decreases.   The HIT panel resulted and is showing antibody positive and SRA negative. The antibody test has high sensitivity (~95%) and low specificity (60-80%). SRA is considered the *gold standard* with both a high sensitivity (88-100%) and specificity (89-100%). Additional testing report the optical  density to be 2.694 which increases the chance that this is true HIT despite the SRA being negative. The IM team consulted hematology who recommended to check a repeat SRA and continue Angiomax and Coumadin until INR 2-3 (2.5 per Cardiology) then Coumadin alone.   Hgb/Hct remains low but stable (hx anemia). Plts continue trending up and are now wnl. Amiodarone started 8/28 which will increase warfarin sensitivity.  Angiomax is turned off during HD sessisons as it is running through the HD port and the patient has no other access. INR today is 3 on Angiomax and Coumadin but as previously stated, Angiomax can falsely elevate the INR. Typically once Angiomax is stopped, an INR is checked 4 hrs later to ensure the INR remains therapeutic. INR is 2.09 4 hrs after Angiomax turned off. Spoke with Dr. Dierdre Searles who would like to discuss INR with hematology and will call me back and let me know to resume Angiomax or keep off.  Goal of Therapy:  INR 2-3 (off Angiomax) aPTT 50-85 seconds Monitor platelets by anticoagulation protocol: Yes  Plan:  -Coumadin 7.5 mg PO tonight -INR daily -Await call back from Dr. Dierdre Searles regarding Angiomax plans  [facility], [place].D., BCPS Clinical Pharmacist Pager: [phone] 09/25/2012 2:37 PM

## 2012-09-25 NOTE — Progress Notes (Signed)
Discussed with On call Dr. Arbutus Ped who suggested that we could discontinue Angiomax drip since his INR is therapeutic at 3.00. Patient can be discharged to home from Hematology stand point. Patient will need to follow up with Dr. Sharyn Creamer as outpatient.

## 2012-09-25 NOTE — Progress Notes (Signed)
Subjective: Patient feels ok. No c/o. No acute events overnight.  Objective: Vital signs in last 24 hours: Filed Vitals:   09/25/12 1030 09/25/12 1100 09/25/12 1130 09/25/12 1151  BP: 102/54 108/60 108/60 124/63  Pulse: 68 70 76 78  Temp:    98.8 F (37.1 C)  TempSrc:    Oral  Resp: 24 14 19 24   Height:      Weight:    318 lb 12.6 oz (144.6 kg)  SpO2:    95%   Weight change: 19 lb 6.4 oz (8.8 kg)  Intake/Output Summary (Last 24 hours) at 09/25/12 1742 Last data filed at 09/25/12 1151  Gross per 24 hour  Intake  686.4 ml  Output   4900 ml  Net -4213.6 ml   Constitutional: Obese, NAD  Cardiovascular: RRR, S1 normal, S2 normal, pitting +2 edema  Pulmonary/Chest: Air entry equal bilaterally. Clear to auscultation bilaterally  Abdominal: Soft. Non-tender, non-distended, bowel sounds are normal, no masses, organomegaly, or guarding present.  Musculoskeletal: No synovitis, effusions, or erythema of bilateral upper and lower  appendicular joints  Neurological: A&O x3  Skin: both LE wrapped    Lab Results: Basic Metabolic Panel:  Recent Labs Lab 09/24/12 0455 09/25/12 0500  Adam [name] 136 135  K 3.3* 3.2*  CL 94* 95*  CO2 26 24  GLUCOSE 123* 150*  BUN 17 27*  CREATININE 3.34* 4.18*  CALCIUM 9.3 8.8  MG 2.0 1.8  PHOS 3.3 4.3   Liver Function Tests:  Recent Labs Lab 09/24/12 0455 09/25/12 0500  ALBUMIN 3.1* 2.9*   CBC:  Recent Labs Lab 09/24/12 0455 09/25/12 0500  WBC 9.7 8.7  HGB 8.5* 7.9*  HCT 28.7* 25.8*  MCV 92.9 90.5  PLT 255 264   Cardiac Enzymes:  Recent Labs Lab 09/18/12 1909 09/19/12 0200  TROPONINI <0.30 <0.30   BNP: CBG:  Recent Labs Lab 09/23/12 2127 09/24/12 0738 09/24/12 1127 09/24/12 1712 09/24/12 2138 09/25/12 1242  GLUCAP 111* 107* 118* 137* 158* 113*   Coagulation:  Recent Labs Lab 09/23/12 0504 09/24/12 0455 09/25/12 0500 09/25/12 1053  LABPROT 21.6* 23.1* 30.1* 22.8*  INR 1.94* 2.12* 3.00* 2.09*   Anemia  Panel:  Recent Labs Lab 09/18/12 2220  VITAMINB12 728  FOLATE 12.8  FERRITIN 108  RETICCTPCT 3.2*   Urine Drug Screen: Drugs of Abuse     Component Value Date/Time   LABOPIA NEG 03/17/2006 1455   COCAINSCRNUR NEG 03/17/2006 1455   LABBENZ NEG 03/17/2006 1455   AMPHETMU NEG 03/17/2006 1455      Micro Results: Recent Results (from the past 240 hour(s))  MRSA PCR SCREENING     Status: None   Collection Time    09/18/12  7:47 PM      Result Value Range Status   MRSA by PCR NEGATIVE  NEGATIVE Final   Comment:            The GeneXpert MRSA Assay (FDA     approved for NASAL specimens     only), is one component of a     comprehensive MRSA colonization     surveillance program. It is not     intended to diagnose MRSA     infection nor to guide or     monitor treatment for     MRSA infections.   Studies/Results: No results found. Medications: I have reviewed the patient's current medications. Scheduled Meds: . amiodarone  400 mg Oral BID  . atorvastatin  20 mg Oral q1800  .  darbepoetin (ARANESP) injection - DIALYSIS  150 mcg Intravenous Q Sat-HD  . doxercalciferol  2 mcg Intravenous Q T,Th,Sa-HD  . febuxostat  80 mg Oral Daily  . ferric gluconate (FERRLECIT/NULECIT) IV  125 mg Intravenous Q T,Th,Sa-HD  . insulin aspart  0-9 Units Subcutaneous TID WC  . insulin aspart  2 Units Subcutaneous TID WC  . insulin glargine  19 Units Subcutaneous QHS  . metoprolol succinate  25 mg Oral BID  . multivitamin  1 tablet Oral QHS  . pantoprazole  40 mg Oral Daily  . sodium chloride  3 mL Intravenous Q12H  . warfarin  7.5 mg Oral ONCE-1800  . Warfarin - Pharmacist Dosing Inpatient   Does not apply q1800   Continuous Infusions: . sodium chloride 13 mL/hr at 09/22/12 0700   PRN Meds:.sodium chloride, sodium chloride, sodium chloride, acetaminophen, diclofenac sodium, docusate sodium, feeding supplement (NEPRO CARB STEADY), lidocaine (PF), lidocaine-prilocaine, LORazepam,  pentafluoroprop-tetrafluoroeth, sodium chloride Assessment/Plan:  #Thrombocytopenia - resolved s/p discontinuing heparin. 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 IV Heparin started on 8/18 with thrombocytopenia on 8/19, stopped on 8.22.    Recent Labs Lab 09/21/12 0440 09/22/12 0400 09/23/12 0504 09/24/12 0455 09/25/12 0500  PLT 170 180 228 255 264   - INR 3.0 this am. Angiomax D/C'd and repeat INR in 4 hours was 2.09. Cardiology is ok with d/c Angiomax (pharmacy called Dr. Gala Romney). Uptodate indicated the INR range 2-3 for HIT as well. -Obtain HIT panel -HIT AB positive (95% sensitive for Ab to PF 4/Heparin), OD units 2.694 positive, SRA neg (<20% release).  -Per hematology Dr. Roanna Raider - To repeat SRA (miscellaneous test to result Saturday in PM), NO heparin, continue angiomax and coumadin until INR 2-3 (ok with cardiology), then coumadin  - Per Dr. Lowell Guitar note - Spoke with Dr. Lawernce Keas who states if repeat SRA test negative would be able to give heparin. INR currently 2.09 -Due to epsiodes of Vtach, argatraban that was started on 8/23 stopped on 8/24 due to risk of cardiac arrest (6%), ventricular tachycardia (5%), bradycardia (5%), myocardial infarction (PCI: 4%), atrial fibrillation (3%), angina (2%)  -On coumadin since 8/25  - 8/24 DUS of bilateral extremities to r/o DVT - no DVT or SVT in bilateral extremities  -Monitor for bleeding   # Atrial Fibrallation - new onset in setting of volume overload (ESRD and CHF)  -No firing of AICD - no interrogation needed per cards  - Cardiology consult - rate control for now, cardioversion if needed  -Heart Failure consult - Transition metoprolol to Toprol XL 50mg  BID, no events per interrogation of device  -On coumadin 8/25 for [facility]  -Per Dr. Shirlee Latch - amiodarone 200 to 400 mg bid to try to keep him in NSR, &decrease Toprol XL to 25 mg bid given initiation of amiodarone   # ESRD with severe volume overload refractory to  diuretic therapy necessitating emergent HD, with right arm AV cimino fistula and perm cath  -HD 8/14 - tolerated well, 4L off, episode of hypotension and tachycardia - asymptomatic  -HD 8/16 - 3L off  -HD 8/18 - 4L off  -HD 8/20 - tacyhcardia, 2.9L off  -HD 8/21- Erratic HR, bp low 100s, 2.9L off  -HD 8/23 - 2.7L off  -HD 8/25 - 3.2L  -HD 8/26 - 2.7L , tachycardia 140-150's, UF was turned off  -HD 8/28 - 4.8L  -Carb restricted diet with 1.0L fluid restriction  -Continue daily weight - 363 to 337 wt loss  since admission  -Monitor for hyperkalemia - currently hypokalemia  -PT - home health PT recommended  -OT evaluation - needs home health OT, 3 in 1 bedside comode;Other (comment);Tub/shower bench (needs wide BSC and RW for over 300#. Needs [employer] tub bench  -Pt to have advanced home health care  -Pt to call medicaid transportation for options regarding transportation services to HD  -Per Dr. Lowell Guitar next HD Saturday on schedule. HD outpatient at [facility] . Will do 5 hours due to his size and instability with Txs. Will plan 3 days per week treatments, 5 hrs each.   #Dyspnea - stable, present with exertion, not at rest. Patient with h/o of CHF, anemia, and CKD with ongoing dyspnea for past month due to fluid overload requiring emergent hemodialysis. Also with acute on chronic systolic/ diastolic failure. Since admission, patient has had several episodes of ventricular tachycardia with atrial fibrillation necessitating AC therapy with heparin drip on 8/18. 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 Argatraban was stopped due to episodes of tachycardia and known cardiac side effects. Instead angiomax started. VT most likely due to new onset atrial fibrillation in setting of CHF exacerbation. Doppler US of bilateral lower extremity with no evidence of clot and patient with no leg pain. Patient with Geneva score of 3.0 points, indicating low risk for PE 7-9%  incidence. Patient has been anitcoagulated since 8/18 making pulmonary embolus less likely. He is presently not dyspneic, tachypneic, or tachycardic and normal oxygen saturation on 2-3L.  -Do not recommend V/Q scan - if low/intermediate probability will not exclude PE. If normal would exclude PE but in setting of vascular congestion and pulmonary edema will most likely not be normal.  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 Left great toe with what appears to be an infected paronychia, recommend I & D of paronychia and antibiotics  -Obtain Xray left foot - no osteomyelitis  -Obtain ABI - R 1.41 L 1.39 - suggesting calcified vessels, waveforms of b/l LE WNL  -Ortho consult - Per Dr. Eulah Pont no surgical debridement at this time, continue wound care, antibiotics - keflex 250mg  daily on day 9/10  - Per Dr. Lajoyce Corners - wound care nurses to apply a Profore wrap 4 both lower extremities. To be d/c with compression wraps and f/u in office for wound care  -DUS bilateral LE - no DVT or SVT in bilateral lower extremities  -Wound care 8/29 - Will need compression wraps change 2x wk alternate days from his HD days. Will need medical MD to at some  point have patient fitted for long term compression stockings for management of LE edema.   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 Nausea w/o vomiting or abdominal pain, normal BM, likely due to HD and electrolyte/fluid shift; started 8/15, 1 episode of green emesis on 8/16 and again 8/17 after eating  -Zofran as needed for nausea   #Somnolence - resolved. Pt reports increased sleepiness after starting HD  --ABG on 8/15- hypoxia, hypocapnia, low bicarbonate  - D/c narcotics   # Prophylaxis : coumadin      Dispo: Disposition is deferred at this time, awaiting improvement of current medical problems.  Anticipated discharge in approximately 1-2 day(s).   The patient does have a current PCP Lars Masson, MD) and does need an [facility] hospital follow-up appointment after discharge.  The patient does not have transportation limitations that hinder transportation to clinic appointments.  .Services Needed at time of discharge: Y = Yes, Blank = No PT:   OT:   RN:   Equipment:   Other:      LOS: 16 days   Dede Query, MD 09/25/2012, 5:42 PM

## 2012-09-25 NOTE — Procedures (Signed)
On hemodialysis.  Goal= 5000 cc.   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 Sharyn Creamer to restart heparin  Plan HD outpatient at [facility] . Will do 5 hours due to his size and instability with Txs. Will plan 3 days per week treatments, 5 hrs each. Currently heparin free.  Adam [name]

## 2012-09-26 LAB — RENAL FUNCTION PANEL
CO2: 27 mEq/L (ref 19–32)
Calcium: 9.1 mg/dL (ref 8.4–10.5)
Chloride: 94 mEq/L — ABNORMAL LOW (ref 96–112)
Creatinine, Ser: 3.44 mg/dL — ABNORMAL HIGH (ref 0.50–1.35)
GFR calc non Af Amer: 20 mL/min — ABNORMAL LOW (ref >90)
Glucose, Bld: 126 mg/dL — ABNORMAL HIGH (ref 70–99)

## 2012-09-26 LAB — GLUCOSE, CAPILLARY
Glucose-Capillary: 182 mg/dL — ABNORMAL HIGH (ref 70–99)
Glucose-Capillary: 109 mg/dL — ABNORMAL HIGH (ref 70–99)

## 2012-09-26 LAB — APTT: aPTT: 51 seconds — ABNORMAL HIGH (ref 24–37)

## 2012-09-26 LAB — PROTIME-INR
INR: 2.02 — ABNORMAL HIGH (ref 0.00–1.49)
Prothrombin Time: 22.2 seconds — ABNORMAL HIGH (ref 11.6–15.2)

## 2012-09-26 LAB — CBC
Hemoglobin: 8.6 g/dL — ABNORMAL LOW (ref 13.0–17.0)
MCH: 27.5 pg (ref 26.0–34.0)
Platelets: 273 10*3/uL (ref 150–400)
RBC: 3.13 MIL/uL — ABNORMAL LOW (ref 4.22–5.81)
WBC: 8.8 10*3/uL (ref 4.0–10.5)

## 2012-09-26 LAB — MAGNESIUM: Magnesium: 2 mg/dL (ref 1.5–2.5)

## 2012-09-26 MED ORDER — POTASSIUM CHLORIDE CRYS ER 20 MEQ PO TBCR
30.0000 meq | EXTENDED_RELEASE_TABLET | Freq: Once | ORAL | Status: AC
  Administered 2012-09-26: 11:00:00 30 meq via ORAL
  Filled 2012-09-26: qty 1

## 2012-09-26 MED ORDER — POTASSIUM CHLORIDE CRYS ER 20 MEQ PO TBCR
30.0000 meq | EXTENDED_RELEASE_TABLET | Freq: Once | ORAL | Status: AC
  Administered 2012-09-26: 17:00:00 30 meq via ORAL
  Filled 2012-09-26: qty 1

## 2012-09-26 MED ORDER — WARFARIN SODIUM 2.5 MG PO TABS
12.5000 mg | ORAL_TABLET | Freq: Once | ORAL | Status: AC
  Administered 2012-09-26: 17:00:00 12.5 mg via ORAL
  Filled 2012-09-26: qty 1

## 2012-09-26 NOTE — Discharge Summary (Signed)
Name: Adam [name] MRN: [id number] DOB: 1965/10/26 47 y.o. PCP: Lars Masson, MD  Date of Admission: 09/09/2012  6:03 PM Date of Discharge: 09/27/2012 Attending Physician: Jonah Blue, DO  Discharge Diagnosis: 1. Principal Problem:   End stage renal disease Active Problems:   Type II or unspecified type diabetes mellitus with renal manifestations, uncontrolled(250.42)   Hypopotassemia   Morbid obesity   ANXIETY   TOBACCO ABUSE   OBSTRUCTIVE SLEEP APNEA   HYPERTENSION   CARDIOMYOPATHY   Chronic systolic heart failure   GERD   Leg swelling   ICD (implantable cardioverter-defibrillator), single, in situ; [employer]   Normocytic anemia   Open wound of foot  Discharge Medications:   Medication List    STOP taking these medications       metoprolol 100 MG tablet  Commonly known as:  LOPRESSOR      TAKE these medications       acetaminophen 325 MG tablet  Commonly known as:  TYLENOL  Take 650 mg by mouth every 6 (six) hours as needed for pain.     amiodarone 400 MG tablet  Commonly known as:  PACERONE  Take 0.5 tablets (200 mg total) by mouth daily.     colchicine 0.6 MG tablet  Take 0.6 mg by mouth See admin instructions. Takes every three days.     diclofenac sodium 1 % Gel  Commonly known as:  VOLTAREN  Apply 2 g topically 4 (four) times daily.     docusate sodium 100 MG capsule  Commonly known as:  COLACE  Take 100 mg by mouth as needed for constipation.     esomeprazole 20 MG capsule  Commonly known as:  NEXIUM  Take 1 capsule (20 mg total) by mouth daily before breakfast.     fluticasone 50 MCG/ACT nasal spray  Commonly known as:  FLONASE  Place 1 spray into the nose daily as needed for allergies. For allergies     insulin glargine 100 UNIT/ML injection  Commonly known as:  LANTUS  Inject 62 Units into the skin at bedtime.     loratadine 10 MG tablet  Commonly known as:  CLARITIN  Take 10 mg by mouth daily.     LORazepam 0.5 MG tablet  Commonly  known as:  ATIVAN  TAKE 1 TABLET TWICE A DAY AS NEEDED     metoprolol succinate 25 MG 24 hr tablet  Commonly known as:  TOPROL-XL  Take 1 tablet (25 mg total) by mouth 2 (two) times daily.     oxyCODONE-acetaminophen 5-325 MG per tablet  Commonly known as:  ROXICET  Take 1-2 tablets by mouth every 4 (four) hours as needed for pain.     simvastatin 40 MG tablet  Commonly known as:  ZOCOR  Take 40 mg by mouth every evening.     traMADol 100 MG 24 hr tablet  Commonly known as:  ULTRAM-ER  Take 100 mg by mouth every 6 (six) hours as needed for pain.     ULORIC 80 MG Tabs  Generic drug:  Febuxostat  Take 1 tablet by mouth daily.     warfarin 2.5 MG tablet  Commonly known as:  COUMADIN  Take 3 tablets (7.5 mg total) by mouth daily.        Disposition and follow-up:   Adam [name] was discharged from [facility] in stable condition.  At the hospital follow up visit please address:  1.  INR on coumadin- will be  followed by Dr. Alexandria Lodge     Cardiology to address Afib rhythm & rate control - on toprol XL 25mg  BID  and amiodorone 400 mg BID x 1 week, then 200mg  BID x 1 week, then 200mg  daily     Nephrology to address ACD and secondary hyperparathyroidism     Wound care of left LE, finished 10 day keflex antibiotic course- will need follow up with ortho (Dr. Lajoyce Corners)     2.   Labs / imaging needed at time of follow-up: INR,  BMP ( K )  3.  Pending labs/ test needing follow-up: SRA  Follow-up Appointments: Follow-up Information   Follow up with DUDA,MARCUS V, MD. Call in 1 week.   Specialty:  Orthopedic Surgery   Contact information:   [address] [place] [address] [phone]       Follow up with [facility]. [facility] RN, physical therapy and aide)    Contact information:   [address] [place] [address] [phone]       Schedule an appointment as soon as possible for a visit with Lars Masson, MD.    Specialty:  Internal Medicine   Contact information:   [address] [place] [address] [phone]       Follow up with [facility].   Contact information:   [address] [place] [address] [phone]      Schedule an appointment as soon as possible for a visit with Myra Rude, MD.   Specialty:  Hematology and Oncology   Contact information:   [address] [place] [address] [phone]       Schedule an appointment as soon as possible for a visit with Janie Morning, Ricke Hey, [facility].   Specialty:  Pharmacist   Contact information:   - - - - [place] [address] [phone]       Discharge Instructions: Discharge Orders   Future Appointments Provider Department Dept Phone   11/29/2012 10:30 AM [facility] Main Office [place]) [phone]   Future Orders Complete By Expires   (HEART FAILURE PATIENTS) Call MD:  Anytime you have any of the following symptoms: 1) 3 pound weight gain in 24 hours or 5 pounds in 1 week 2) shortness of breath, with or without a dry hacking cough 3) swelling in the hands, feet or stomach 4) if you have to sleep on extra pillows at night in order to breathe.  As directed    Call MD for:  persistant nausea and vomiting  As directed    Call MD for:  severe uncontrolled pain  As directed    Call MD for:  temperature >100.4  As directed    Diet - low sodium heart healthy  As directed    Discharge wound care:  As directed    Comments:     Wound care will be coming to see you at home.   Increase activity slowly  As directed       Consultations: Treatment Team:  Lauris Poag, MD Pricilla Riffle, MD Nadara Mustard, MD  Procedures Performed:  Dg Chest [facility]  09/10/2012   *RADIOLOGY REPORT*  Clinical Data: Lower extremity swelling.  Weakness and dizziness.  PORTABLE CHEST - 1 VIEW  Comparison: 08/31/2012.  Findings: The pacer wire / AICD is stable.  The dialysis catheter is  unchanged.  The heart is enlarged and there is vascular congestion and pulmonary edema.  No definite pleural effusion.  IMPRESSION: Cardiac enlargement and pulmonary edema suggesting CHF.  No definite pleural effusion.   Original Report Authenticated By: Rudie Meyer, M.D.   Dg Chest Port 1 View  08/31/2012   *RADIOLOGY REPORT*  Clinical Data: Status post hemodialysis catheter placement  PORTABLE CHEST - 1 VIEW  Comparison: Prior chest x-ray 08/26/2012  Findings: Interval placement of a right IJ approach tunneled hemodialysis catheter.  The catheter tips project over the superior cavoatrial junction.  No evidence of pneumothorax.  Enlargement the cardiopericardial silhouette with pulmonary vascular congestion and mild interstitial edema.  Stable left subclavian approach cardiac rhythm maintenance device with the single lead projecting over the right ventricle.  No acute osseous abnormality.  IMPRESSION:  1.  Right IJ approach tunneled hemodialysis catheter with the tips projecting over the superior cavoatrial junction. 2.  Cardiomegaly with mild CHF versus volume overload.   Original Report Authenticated By: Malachy Moan, M.D.   Dg Foot Complete Left  09/12/2012   *RADIOLOGY REPORT*  Clinical Data: Left toe infection, left dorsal foot ulcer for 1 month, rule out osteomyelitis left big toe  LEFT FOOT - COMPLETE 3+ VIEW  Comparison: None.  Findings: Extensive small vessel calcification.  No fracture dislocation.  No periosteal reaction or cortical destruction.  IMPRESSION: No evidence for osteomyelitis.   Original Report Authenticated By: Esperanza Heir, M.D.   Dg Fluoro Guide Cv Line-no Report  08/31/2012   CLINICAL DATA: Dialysis Catheter Placement   FLOURO GUIDE CV LINE  Fluoroscopy was utilized by the requesting physician.  No radiographic  interpretation.     2D Echo: none  Cardiac Cath: none  Admission HPI:  Original Author Angelina Sheriff, MD  Adam [name] is a 47 y.o. man with a pmhx of ESRD  c/b cardiorenal syndrome, CHF, Sleep Apnea, and gout who comes to the clinic with a chief complaint of a 50 lb weight gain over the last 1 month. The patient has been seeing a nephrologist who had changed his diuretics as his Cr was > 6. However, fluid retention had necessitated dialysis. The patient planned to start dialysis tomorrow and has a HD cath in place. The patient was recently seen in the ED and his torsemide was increased to 160 mg BID. Over the last week the patient has had good urine output, but his swelling has moved from his legs to his torso. He also endorses increasing shortness of breath during this time. He denies fevers, chills, nausea, and vomiting.  Patient recently had placement of a R radial cephalic AV fistula for HD d/t his ESRD. Last Cr was 3.32, but he has been in the 6-7 range previously (08/06/12-08/11/12). He has had many issues recently w/ volume overload and follows w/ Dr. Lowell Guitar for his ESRD. According to the patient, he takes lopressor, aldactone, torsemide, and metolazone.   Hospital Course by problem list:   End Stage Renal Disease - Patient with history of uncontrolled T2DM,  CKD stage 3/4, and cardiomyopathy (EF 25% with AICD) with suspected cardiorenal syndrome who presented with 50lb weight gain over 1 month duration with worsening lower extremity edema and dyspnea that was refractory to diuretic therapy with accompanying  acute renal failure (Cr 6-7). Patient with ESRD necessitating emergent HD. Patient with right radial cephalic AV Cimino fistula and perm cath in place since 08/31/12. Patient was Hepatitis B negative. His Cr on admission was 3.68.   Patient received total of 10 sessions of HD beginning 8/14, with 31L off (to 8/30)  and weight loss of  45 lb (363 on admission to 318 on 8/30).  Patient with improved LE edema and dyspnea. Patient was continued on renal and then carb modified diet with 1.2L fluid restriction. Patient to receive HD on TTS schedule at [facility] with 5hr sessions.Session to start at 6:45 AM. Due to the early start of his schedule the center requested that the patient come in the day before to sign his paperwork/consents. Center to be advised when patient is discharged. Patient is aware that he  will need to call Medicaid transportation once discharged for transportation to HD sessions. Patient to receive home health PT and OT services. Patient was made arrangements for [facility]. Patient was made aware that [facility] aide services for assistance with meals and light house work is not available except with Personal Care Service through [facility], and as this patient has Medicaid he can begin this process on his own once d/c from the hospital. Upon discharge, patient remained SOB with exertion only and O2 sats decreased to 84% with ambulation, so he was sent home on 2LPM O2.  Heparin Induced Thrombocytopenia -Patient presented with normal platlet count on admission (230K). Patient received SQ heparin at time of admission.  Patient received  heparin on 8/14 with initiation of HD. Due to new onset atrial fibrillation, patient was started on IV heparin for anticoagulation on 8/18. Patient was found to have downtrending platelet since admission but thrombocytopenia on 8/19 (136K). Patient was started on coumadin bridge for new onset atrial fibrillation on 8/20.  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  HIT panel was sent on 8/22 and argatroban also started on 8/22  On 8/23 patient with NSVT and prolonged QTc (present since 8/19). Due to potential cardiac SE's of argatroban, it was stopped on 8/23.  Hematology was consulted who started Bivalirudin (Angiomax) on 8/23 and coumadin on 8/25 with resolution of thrombocytopenia on 8/25.  Due to concern for thrombosis in setting of HIT and  LE swelling and left foot pain, doppler US was obtained which did not reveal  thrombosis. CTA chest was not obtained due to low probability of PE. 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  Per hematology heparin was not to be restarted until repeat SRA results (sent on 8/28)  with angiomax and coumadin until INR 2-3.  Per hematolgy if repeat SRA negative can administer heparin in HD. Patient's angiomax stopped on 8/30 due to therapeutic INR and continued on coumadin. Patient to follow-up with Dr. Sharyn Creamer as outpatient for follow-up.    New Onset Paroxysmal Atrial Fibrillation - Patient was monitored on telemetry and had multiple episodes of Vtach throughout hospitalization as well as atrial flutter and atrial fibrillation with intermittent hypotension.   Patient was symptomatic at times with palpitations and dyspnea. Stat EKG's and troponin were obtained that did not indicate ischemic change. Patient's ICD was interrogated on 8/26 and did not reveal abnormal activity. Patient was started on IV heparin for anticoagulation on 8/18 and bridged to coumadin on 8/20 due to CHAD2 score 3 with need for long-term AC. Patient with minimal nasal bleeding that resolved. He was hemodynamically stable with AC therapy.  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 On 8/23 patient with Vtach and prolonged QTc (since 8/19). Due to potential cardiac SE of argatroban, it was stopped on  8/23  Hematology was consulted who started Bivalirudin (Angiomax) on 8/23 and restarted coumadin on 8/25. Angiomax stopped on 8/30 and patient was continued on coumadin.  Amiodorone 200mg  BID was added on 8/28 for rhythm control and increased to 400 BID on 8/29 due to persistent episodes of Vtach. Per Dr. Gala Romney patient to continue amiodorone  400 bid x 2 weeks then switch back to 200 bid. Patient to continue coumadin AC therapy with outpatient INR checks.   Left Forefoot Wound - Patient presented with painful left forefoot wound present for  past month. Wound care was consulted that identified left forefoot wound as 1.5cm x 2cm x 0.2cm ulcer and left great toe with possible infected paronychia.  Xray of left foot (09/12/12) revealed no fracture, dislocation, or osteomyelitis. ABI  (09/13/12) was suggestive of calcified vessels due to elevated bilateral ABIs (R: 1.41, L: 1.39) and waveforms of bilateral LE were within normal limit. Orthopedics was consulted that identified left foot cellulitis  and pressure wound in the setting of uncontrolled DM.  IV ancef was initially (8/18 and 8/19 two doses received) started later changed to oral keflex 250mg  daily for 10 additional days with end date 8 /29.  No surgical debridement was needed and weight bearing as tolerated was instructed to patient.  Bilateral venous DUS (09/19/12)  did not reveal DVT, SVT, or baker's cyst. Wound care applied a Profore wrap 4 both lower extremities. Patient to discharge home on compression wraps and followup in office (Dr. Lajoyce Corners) for wound care or will need [facility] at the time of discharge for changes. Per WOC, will need compression wraps changed 2x alternate days from HD and medical MD to fit for long term compression stockings.   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 Tenny Craw. EF 25% on Last 2-D Echo 12/2011 with AICD placement 2011. 2006 Cath with normal coronary arteries.  Chest xray (09/10/12) revealed cardiomegaly and pulmonary edema suggesting CHF with no pleural effusions. Patient was continued on home metoprolol 100 mg later stopped (8/17) due to low blood pressure. Due to runs of Vtach, low dose metoprolol 12.5 mg BID was added (8/18)  and digoxin (stopped after 1 dose due to patient being on HD) which was later adjusted to metoprolol 12.5 mg QID (8/18),  25mg  QID (8/21), 50mg  QID (8/22),  50mg  TID (8/22), then toprol XL 50mg  BID (8/26) to 25 mg BID (8/28). Patient's ICD was interrogated on 8/26 and did  not reveal abnormal activity.    Secondary Hyperparathyroidism - Patient with elevated PTH (335) and normal corrected calcium with vitamin D deficiency (10). Patient was started on hectorol. No bone pain reported during hospitalization.   Normocytic Anemia  -  Patient presented with normocytic anemia with H/H 8.7/27.9 and remained hemodynamically stable throughout hospitalizaiton. Anemia panel (8/18) revealed low iron and iron saturation with normal TIBC. B12 and folate were normal. Reticulocyte count manual normal. Patient started on weekly Aramesp injections and IV iron with HD.   Hyperphosphatemia - Patient with elevated phosphate (4.9) on admission that normalized during hospitalization.    Hypertension - Patient with blood pressure 86/53 - 159/95 during hospitalization.   Hypotension - Patient with multiple episodes of low blood pressure thought to be due to pressure change from receiving HD that necessitated stopping narcotics and blood pressure medications including beta blocker early during hospitalization. Orthostatic labs did not reveal orthostatic hypotension.  Patient had multiple episodes of arrythmia with VT to Aflutter/Afib necessitating rate  and rhythm control.    Elevated Alk Phos and Bili - Patient with elevated Alk Phos and total bilirubin on admission that improved during hospitalization but did not mormalize. E   Dyslipidemia - Patient was continued on home statin. Last lipid panel (03/2012) with low HDL (26).     Hypoalbuminemia - Patient with albumin on admission of 3.3 and low albumin throughout hospitalization.   Hypokalemia - Patient presented with potassium of 3.4 that normalized during hospitalization with correction with HD and oral potassium replacement as needed.   Type II Diabetes Mellitus, uncontrolled with renal manifestations - Last HBa1c unknown. Patient received  Lantus 15- 17U (50 U at beginning of hospitalization) and ISS for glycemic control. Glucose range  85-219.  Gout - Patient was continued on home febuxostat. No acute flare-up of gout during hospitalization.   GERD - Patient was continued on home protonix during hospitalization.   Obstructive Sleep Apnea - Patient was supplied with CPAP at night as tolerated.  Somnolence - Patient with sleepiness and fatigue for 2-3 days on admission after starting HD that resolved. Narcotic pain medications ( tramadol, oxycodone as needed) for knee pain were consequently stopped. Patient also with non-compliance with CPAP. ABG on 09/10/12 was performed that revealed hypoxia, hypocapnia and low bicarbonate.   Emesis - Patient with non-bloody, non-bilious  emesis for 3 days at beginning of admission after starting HD that resolved with anti-nausea medications.  Anxiety - Patient received Ativan as needed for anxiety symptoms.   Discharge Vitals:   BP 121/97  Pulse 74  Temp(Src) 98.1 F (36.7 C) (Oral)  Resp 18  Ht 6\' 1"  (1.854 m)  Wt 144.6 kg (318 lb 12.6 oz)  BMI 42.07 kg/m2  SpO2 100%  Discharge Labs:  Results for orders placed during the hospital encounter of 09/09/12 (from the past 24 hour(s))  GLUCOSE, CAPILLARY     Status: Abnormal   Collection Time    09/26/12  5:01 PM      Result Value Range   Glucose-Capillary 109 (*) 70 - 99 mg/dL   Comment 1 Documented in Chart     Comment 2 Notify RN    GLUCOSE, CAPILLARY     Status: Abnormal   Collection Time    09/26/12 10:01 PM      Result Value Range   Glucose-Capillary 148 (*) 70 - 99 mg/dL   Comment 1 Documented in Chart     Comment 2 Notify RN    RENAL FUNCTION PANEL     Status: Abnormal   Collection Time    09/27/12  5:00 AM      Result Value Range   Sodium 135  135 - 145 mEq/L   Potassium 3.5  3.5 - 5.1 mEq/L   Chloride 97  96 - 112 mEq/L   CO2 25  19 - 32 mEq/L   Glucose, Bld 139 (*) 70 - 99 mg/dL   BUN 32 (*) 6 - 23 mg/dL   Creatinine, Ser 1.61 (*) 0.50 - 1.35 mg/dL   Calcium 8.8  8.4 - 09.6 mg/dL   Phosphorus 3.5  2.3 - 4.6  mg/dL   Albumin 2.8 (*) 3.5 - 5.2 g/dL   GFR calc non Af Amer 15 (*) >90 mL/min   GFR calc Af Amer 17 (*) >90 mL/min  PROTIME-INR     Status: Abnormal   Collection Time    09/27/12  5:00 AM      Result Value Range   Prothrombin Time 25.7 (*) 11.6 -  15.2 seconds   INR 2.44 (*) 0.00 - 1.49  GLUCOSE, CAPILLARY     Status: Abnormal   Collection Time    09/27/12  7:50 AM      Result Value Range   Glucose-Capillary 125 (*) 70 - 99 mg/dL  GLUCOSE, CAPILLARY     Status: Abnormal   Collection Time    09/27/12 11:33 AM      Result Value Range   Glucose-Capillary 176 (*) 70 - 99 mg/dL    Signed: Windell Hummingbird, MD 09/27/2012, 12:12 PM   Time Spent on Discharge: 45 minutes Services Ordered on Discharge: Home health PT & OT Equipment Ordered on Discharge: 3 in 1 bedside comode;Tub/shower bench (needs wide BSC and RW for over 300#. Needs [employer] tub bench) ; Home oxygen 2LPM [place]

## 2012-09-26 NOTE — Progress Notes (Signed)
Subjective: Patient states that he feels "much better" today. No c/o. No acute events overnight. Pt states that he is ready to go home but is amenable to staying until tomorrow.   Objective: Vital signs in last 24 hours: Filed Vitals:   09/25/12 1809 09/25/12 2043 09/26/12 0601 09/26/12 0954  BP: 95/54 102/57 81/48 102/43  Pulse: 104 80 70 90  Temp: 98.5 F (36.9 C) 98.9 F (37.2 C) 98.3 F (36.8 C) 98.3 F (36.8 C)  TempSrc: Oral Oral Oral Oral  Resp: 22 20 22 16   Height:      Weight:      SpO2: 92% 93% 97% 98%   Weight change: -21 lb 13.2 oz (-9.9 kg)  Intake/Output Summary (Last 24 hours) at 09/26/12 1228 Last data filed at 09/26/12 0600  Gross per 24 hour  Intake    600 ml  Output      0 ml  Net    600 ml   Constitutional: Obese, NAD  Cardiovascular: RRR, S1 normal, S2 normal Pulmonary/Chest: Air entry equal bilaterally. Clear to auscultation bilaterally  Abdominal: Soft. Non-tender, non-distended, bowel sounds are normal, no masses, organomegaly, or guarding present.  Musculoskeletal: No synovitis, effusions, or erythema of bilateral upper and lower  appendicular joints.   Neurological: A&O x3  Skin: BLE wrapped, Nonpitting edema into the thighs, nontender    Lab Results: Basic Metabolic Panel:  Recent Labs Lab 09/25/12 0500 09/26/12 0500  NA 135 134*  K 3.2* 2.9*  CL 95* 94*  CO2 24 27  GLUCOSE 150* 126*  BUN 27* 19  CREATININE 4.18* 3.44*  CALCIUM 8.8 9.1  MG 1.8 2.0  PHOS 4.3 3.1   Liver Function Tests:  Recent Labs Lab 09/25/12 0500 09/26/12 0500  ALBUMIN 2.9* 3.2*   CBC:  Recent Labs Lab 09/25/12 0500 09/26/12 0500  WBC 8.7 8.8  HGB 7.9* 8.6*  HCT 25.8* 29.0*  MCV 90.5 92.7  PLT 264 273   Cardiac Enzymes: No results found for this basename: CKTOTAL, CKMB, CKMBINDEX, TROPONINI,  in the last 168 hours BNP: CBG:  Recent Labs Lab 09/24/12 2138 09/25/12 1242 09/25/12 1623 09/25/12 2136 09/26/12 0726 09/26/12 1148    GLUCAP 158* 113* 160* 157* 118* 182*   Coagulation:  Recent Labs Lab 09/24/12 0455 09/25/12 0500 09/25/12 1053 09/26/12 0500  LABPROT 23.1* 30.1* 22.8* 22.2*  INR 2.12* 3.00* 2.09* 2.02*   Anemia Panel: No results found for this basename: VITAMINB12, FOLATE, FERRITIN, TIBC, IRON, RETICCTPCT,  in the last 168 hours Urine Drug Screen: Drugs of Abuse     Component Value Date/Time   LABOPIA NEG 03/17/2006 1455   COCAINSCRNUR NEG 03/17/2006 1455   LABBENZ NEG 03/17/2006 1455   AMPHETMU NEG 03/17/2006 1455      Micro Results: Recent Results (from the past 240 hour(s))  MRSA PCR SCREENING     Status: None   Collection Time    09/18/12  7:47 PM      Result Value Range Status   MRSA by PCR NEGATIVE  NEGATIVE Final   Comment:            The GeneXpert MRSA Assay (FDA     approved for NASAL specimens     only), is one component of a     comprehensive MRSA colonization     surveillance program. It is not     intended to diagnose MRSA     infection nor to guide or     monitor treatment for  MRSA infections.   Studies/Results: No results found. Medications: I have reviewed the patient's current medications. Scheduled Meds: . amiodarone  400 mg Oral BID  . atorvastatin  20 mg Oral q1800  . [START ON 09/28/2012] darbepoetin (ARANESP) injection - DIALYSIS  150 mcg Intravenous Q Tue-HD  . doxercalciferol  2 mcg Intravenous Q T,Th,Sa-HD  . febuxostat  80 mg Oral Daily  . ferric gluconate (FERRLECIT/NULECIT) IV  125 mg Intravenous Q T,Th,Sa-HD  . insulin aspart  0-9 Units Subcutaneous TID WC  . insulin aspart  2 Units Subcutaneous TID WC  . insulin glargine  19 Units Subcutaneous QHS  . metoprolol succinate  25 mg Oral BID  . multivitamin  1 tablet Oral QHS  . pantoprazole  40 mg Oral Daily  . potassium chloride  30 mEq Oral Once  . sodium chloride  3 mL Intravenous Q12H  . Warfarin - Pharmacist Dosing Inpatient   Does not apply q1800   Continuous Infusions: . sodium  chloride 13 mL/hr at 09/22/12 0700   PRN Meds:.sodium chloride, sodium chloride, sodium chloride, acetaminophen, diclofenac sodium, docusate sodium, feeding supplement (NEPRO CARB STEADY), lidocaine (PF), lidocaine-prilocaine, LORazepam, pentafluoroprop-tetrafluoroeth, sodium chloride Assessment/Plan:  #Thrombocytopenia - Resolved s/p discontinuing heparin. 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 IV Heparin started on 8/18 with thrombocytopenia on 8/19, stopped on 8/22.   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 Per hematology Dr. Roanna Raider, repeat SRA (miscellaneous test to result Saturday in PM), NO heparin, continue angiomax and coumadin until INR 2-3 (ok with cardiology), then coumadin  - Per Dr. Lowell Guitar note - Spoke with Dr. Lawernce Keas who states if repeat SRA test negative would be able to give heparin. INR currently 2.09 -Due to epsiodes of Vtach, argatraban that was started on 8/23 stopped on 8/24 due to risk of cardiac arrest (6%), ventricular tachycardia (5%), bradycardia (5%), myocardial infarction (PCI: 4%), atrial fibrillation (3%), angina (2%)  -On coumadin since 8/25, Angiomax d/c'd 8/30. INR decreased from 3 to 2.09 w/in 4hrs. Continuing to monitor until tomorrow. - 8/24 DUS of bilateral extremities to r/o DVT - no DVT or SVT in bilateral extremities  -Monitor for bleeding, been stable   # Atrial Fibrallation - new onset in setting of volume overload (ESRD and CHF)  -No firing of AICD - no interrogation needed per cards  - Cardiology consult - rate control for now, cardioversion if needed  -Heart Failure consult - Transitioned metoprolol to Toprol XL 50mg  BID, no events per interrogation of device  -On coumadin 8/25 for AC. On 8/30 INR 3.0, so Angiomax D/C'd and repeat INR in 4  hours was 2.09. Cardiology is ok with d/c Angiomax (pharmacy called Dr. Gala Romney). Uptodate indicated the INR range 2-3 for HIT as well. 2.02 today. Will trend INR, and if still within the normal range tomorrow, will d/c home.  -Per Dr. Shirlee Latch, amiodarone 400 mg bid x 1week, then 200mg  BID x1 week, then 200mg  daily to try to keep him in NSR &Toprol XL to 25 mg bid given initiation of amiodarone. Will need f/u in HF clinic at d/c.  # ESRD with severe volume overload refractory to diuretic therapy necessitating emergent HD, with right arm AV cimino fistula and perm cath  -HD 8/14 - tolerated well, 4L off, episode of  hypotension and tachycardia - asymptomatic  -HD 8/16 - 3L off  -HD 8/18 - 4L off  -HD 8/20 - tacyhcardia, 2.9L off  -HD 8/21- Erratic HR, bp low 100s, 2.9L off  -HD 8/23 - 2.7L off  -HD 8/25 - 3.2L  -HD 8/26 - 2.7L , tachycardia 140-150's, UF was turned off  -HD 8/28 - 4.8L  - HD 8/30 - 5L -Carb restricted diet with 1.0L fluid restriction  -Continue daily weight  -Monitor for hyperkalemia - currently hypokalemia  -PT - home health PT recommended  -OT evaluation - needs home health OT, 3 in 1 bedside comode;Other (comment);Tub/shower bench (needs wide BSC and RW for over 300#. Needs [employer] tub bench  -Pt to have advanced home health care  -Pt to call medicaid transportation for options regarding transportation services to HD  -HD outpatient at [facility] . Will do 5 hours due to his size and instability with Txs. Will plan 3 days per week treatments, 5 hrs each.   #Dyspnea - Improved. Present with exertion, not at rest. Patient with h/o of CHF, anemia, and CKD with ongoing dyspnea for past month due to fluid overload requiring emergent hemodialysis. Also with acute on chronic systolic/ diastolic failure. Since admission, patient has had several episodes of ventricular tachycardia with atrial fibrillation necessitating AC therapy with heparin drip on 8/18. 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 Argatraban was stopped due to episodes of tachycardia and known cardiac side effects. Instead angiomax started. VT most likely due to new onset atrial fibrillation in setting of CHF exacerbation. Doppler US of bilateral lower extremity with no evidence of clot and patient with no leg pain. Patient with Geneva score of 3.0 points, indicating low risk for PE 7-9% incidence. Patient has been anitcoagulated since 8/18 making pulmonary embolus less likely. He is presently not dyspneic, tachypneic, or tachycardic and normal oxygen saturation on 2-3L.  -Do not recommend V/Q scan - if low/intermediate probability will not exclude PE. If normal would exclude PE but in setting of vascular congestion and pulmonary edema will most likely not be normal.  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 Left great toe with what appears to be an infected paronychia, recommend I & D of paronychia and antibiotics  -Obtain Xray left foot - no osteomyelitis  -Obtain ABI - R 1.41 L 1.39 - suggesting calcified vessels, waveforms of b/l LE WNL  -Ortho consult - Per Dr. Eulah Pont no surgical debridement at this time,  continue wound care, antibiotics - keflex 250mg  daily on day 9/10  - Per Dr. Lajoyce Corners - wound care nurses to apply a Profore wrap 4 both lower extremities. To be d/c with compression wraps and f/u in office for wound care  -  DUS bilateral LE - no DVT or SVT in bilateral lower extremities  -Wound care 8/29 - Will need compression wraps change 2x wk alternate days from his HD days. Will need medical MD to at some point have patient fitted for long term compression stockings for management of LE edema.   #DM c/b dyslipidemia - last HbA1c 9.1 on 07/27/2012  - Lantus 19U at night  - Novolog 2U, SSI for meal-time coverage  - Continue home statin  - Lipid panel 03/2012 - low HDL (26)   #Normocytic Anemia - Stable. 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 Nausea w/o vomiting or abdominal pain, normal BM, likely due to HD and electrolyte/fluid shift; started 8/15, 1 episode of green emesis on 8/16 and again 8/17 after eating  -Zofran as needed for nausea   #Somnolence - resolved. Pt reports increased sleepiness after starting HD  --ABG on 8/15- hypoxia, hypocapnia, low bicarbonate  - D/c narcotics   # Prophylaxis : On coumadin     Dispo: Disposition is deferred at this time, awaiting improvement of current medical  problems.  Anticipated discharge in approximately 1-2 day(s).   The patient does have a current PCP Lars Masson, MD) and does need an [facility] hospital follow-up appointment after discharge.  The patient does not have transportation limitations that hinder transportation to clinic appointments.  .Services Needed at time of discharge: Y = Yes, Blank = No PT:   OT:   RN:   Equipment:   Other:     LOS: 17 days   Genelle Gather, MD 09/26/2012, 12:28 PM

## 2012-09-26 NOTE — Progress Notes (Signed)
  Date: 09/26/2012  Patient name: Adam [name]  Medical record number: [id number]  Date of birth: 1965/04/08   This patient has been seen and the plan of care was discussed with the house staff. Please see their note for complete details. I concur with their findings.   This is a late note for Friday 8/29.   Inez Catalina, MD 09/26/2012, 7:13 AM

## 2012-09-26 NOTE — Progress Notes (Signed)
ANTICOAGULATION CONSULT NOTE - Follow Up Consult  Pharmacy Consult for Coumadin Indication: New onset Afib, possible HIT  Allergies  Allergen Reactions  . Argatroban Other (See Comments)    Ventricular tachycardia  . Ace Inhibitors     Acute renal failure with multiple trials  . Heparin     ? HIT    Patient Measurements: Height: 6\' 1"  (185.4 cm) Weight: 318 lb 12.6 oz (144.6 kg) IBW/kg (Calculated) : 79.9 Heparin Dosing Weight: 115 kg  Vital Signs: Temp: 98.2 F (36.8 C) (08/31 1456) Temp src: Oral (08/31 1456) BP: 88/50 mmHg (08/31 1456) Pulse Rate: 89 (08/31 1456)  Labs:  Recent Labs  09/24/12 0455 09/25/12 0500 09/25/12 1053 09/26/12 0500  HGB 8.5* 7.9*  --  8.6*  HCT 28.7* 25.8*  --  29.0*  PLT 255 264  --  273  APTT 77* >200*  --  51*  LABPROT 23.1* 30.1* 22.8* 22.2*  INR 2.12* 3.00* 2.09* 2.02*  CREATININE 3.34* 4.18*  --  3.44*    Estimated Creatinine Clearance: 39.3 ml/min (by C-G formula based on Cr of 3.44).   Assessment: 47 y.o. M with complicated PMH including ESRD, new onset Afib and heart failure. The patient was started receiving heparin SQ for VTE px and with hemodilaysis starting on 8/14. On 8/18, the patient was switched to IV heparin for new-onset Afib. Platelets dropped >50% by 8/22 and a HIT panel was sent off. The patient was started on argatroban and on 8/23 experienced VT episodes, QT noted to be prolonged. Pharmacy consulted asked to switch to bivalirudin in light of the rare incidence of argatroban causing VT and the patients notably elevated aPTT despite multiple aggressive rate decreases.   The HIT panel resulted and is showing antibody positive and SRA negative. The antibody test has high sensitivity (~95%) and low specificity (60-80%). SRA is considered the *gold standard* with both a high sensitivity (88-100%) and specificity (89-100%). Additional testing report the optical density to be 2.694 which increases the chance that this is  true HIT despite the SRA being negative. The IM team consulted hematology who recommended to check a repeat SRA and continue Angiomax and Coumadin until INR 2-3 (2.5 per Cardiology) then Coumadin alone.   Hgb/Hct remains low but stable (hx anemia). Plts continue trending up and are now wnl. Amiodarone started 8/28 which will increase warfarin sensitivity.  Angiomax is off. INR is therapeutic at low end of goal. Will give extra Coumadin today but likely dose will need to be decreased once amioadarone starts affecting INR - patient needs close f/u of INR outpatient.  Goal of Therapy:  INR 2-3 Monitor platelets by anticoagulation protocol: Yes  Plan:  -Coumadin 12.5 mg PO tonight -INR daily  [facility], [address].D., BCPS Clinical Pharmacist Pager: [phone] 09/26/2012 3:12 PM

## 2012-09-26 NOTE — Progress Notes (Signed)
Renal Service Daily Progress Note [facility]  Subjective: Feels good, wants to know if leg wraps can come off. Says the doctor told him he is going home tomorrow  Physical Exam:  Blood pressure 102/43, pulse 90, temperature 98.3 F (36.8 C), temperature source Oral, resp. rate 16, height 6\' 1"  (1.854 m), weight 144.6 kg (318 lb 12.6 oz), SpO2 98.00%. 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 Next HD Tuesday, pt has a chair at [facility] on TTS schedule.  5 hr HD due to size for now.  Awaiting repeat SRA test to see if we can use heparin   Vinson Moselle  MD Pager [phone]    Cell  [phone] 09/26/2012, 11:08 AM    Recent Labs Lab 09/24/12 0455 09/25/12 0500 09/26/12 0500  NA 136 135 134*  K 3.3* 3.2* 2.9*  CL 94* 95* 94*  CO2 26 24 27   GLUCOSE 123* 150* 126*  BUN 17 27* 19  CREATININE 3.34* 4.18* 3.44*  CALCIUM 9.3 8.8 9.1  PHOS 3.3 4.3 3.1    Recent Labs Lab 09/24/12 0455 09/25/12 0500 09/26/12 0500  ALBUMIN 3.1* 2.9* 3.2*    Recent Labs Lab 09/24/12 0455 09/25/12 0500 09/26/12 0500  WBC 9.7 8.7 8.8  HGB 8.5* 7.9* 8.6*  HCT 28.7* 25.8* 29.0*  MCV 92.9 90.5 92.7  PLT 255 264 273   . amiodarone  400 mg Oral BID  . atorvastatin  20 mg Oral q1800  . [START ON 09/28/2012] darbepoetin (ARANESP) injection - DIALYSIS  150 mcg Intravenous Q Tue-HD  . doxercalciferol  2 mcg Intravenous Q T,Th,Sa-HD  . febuxostat  80 mg Oral Daily  . ferric gluconate (FERRLECIT/NULECIT) IV  125 mg Intravenous Q T,Th,Sa-HD  . insulin aspart  0-9 Units Subcutaneous TID WC  . insulin aspart  2 Units  Subcutaneous TID WC  . insulin glargine  19 Units Subcutaneous QHS  . metoprolol succinate  25 mg Oral BID  . multivitamin  1 tablet Oral QHS  . pantoprazole  40 mg Oral Daily  . sodium chloride  3 mL Intravenous Q12H  . Warfarin - Pharmacist Dosing Inpatient   Does not apply q1800   . sodium chloride 13 mL/hr at 09/22/12 0700   sodium chloride, sodium chloride, sodium chloride, acetaminophen, diclofenac sodium, docusate sodium, feeding supplement (NEPRO CARB STEADY), lidocaine (PF), lidocaine-prilocaine, LORazepam, pentafluoroprop-tetrafluoroeth, sodium chloride

## 2012-09-26 NOTE — Progress Notes (Signed)
Patient ID: Adam [name], male   DOB: 1965-09-26, 47 y.o.   MRN: [id number] Advanced Heart Failure Rounding Note  SUBJECTIVE:  Doing well this morning.  He is in NSR.  INR therapeutic, off bivalirudin.    Marland Kitchen amiodarone  400 mg Oral BID  . atorvastatin  20 mg Oral q1800  . [START ON 09/28/2012] darbepoetin (ARANESP) injection - DIALYSIS  150 mcg Intravenous Q Tue-HD  . doxercalciferol  2 mcg Intravenous Q T,Th,Sa-HD  . febuxostat  80 mg Oral Daily  . ferric gluconate (FERRLECIT/NULECIT) IV  125 mg Intravenous Q T,Th,Sa-HD  . insulin aspart  0-9 Units Subcutaneous TID WC  . insulin aspart  2 Units Subcutaneous TID WC  . insulin glargine  19 Units Subcutaneous QHS  . metoprolol succinate  25 mg Oral BID  . multivitamin  1 tablet Oral QHS  . pantoprazole  40 mg Oral Daily  . sodium chloride  3 mL Intravenous Q12H  . Warfarin - Pharmacist Dosing Inpatient   Does not apply q1800  \   Filed Vitals:   09/25/12 1809 09/25/12 2043 09/26/12 0601 09/26/12 0954  BP: 95/54 102/57 81/48 102/43  Pulse: 104 80 70 90  Temp: 98.5 F (36.9 C) 98.9 F (37.2 C) 98.3 F (36.8 C) 98.3 F (36.8 C)  TempSrc: Oral Oral Oral Oral  Resp: 22 20 22 16   Height:      Weight:      SpO2: 92% 93% 97% 98%    Intake/Output Summary (Last 24 hours) at 09/26/12 1045 Last data filed at 09/26/12 0600  Gross per 24 hour  Intake    600 ml  Output   4900 ml  Net  -4300 ml    LABS: Basic Metabolic Panel:  Recent Labs  40/98/11 0500 09/26/12 0500  NA 135 134*  K 3.2* 2.9*  CL 95* 94*  CO2 24 27  GLUCOSE 150* 126*  BUN 27* 19  CREATININE 4.18* 3.44*  CALCIUM 8.8 9.1  MG 1.8 2.0  PHOS 4.3 3.1   Liver Function Tests:  Recent Labs  09/25/12 0500 09/26/12 0500  ALBUMIN 2.9* 3.2*   No results found for this basename: LIPASE, AMYLASE,  in the last 72 hours CBC:  Recent Labs  09/25/12 0500 09/26/12 0500  WBC 8.7 8.8  HGB 7.9* 8.6*  HCT 25.8* 29.0*  MCV 90.5 92.7  PLT 264 273   Cardiac  Enzymes: No results found for this basename: CKTOTAL, CKMB, CKMBINDEX, TROPONINI,  in the last 72 hours BNP: No components found with this basename: POCBNP,  D-Dimer: No results found for this basename: DDIMER,  in the last 72 hours Hemoglobin A1C: No results found for this basename: HGBA1C,  in the last 72 hours Fasting Lipid Panel: No results found for this basename: CHOL, HDL, LDLCALC, TRIG, CHOLHDL, LDLDIRECT,  in the last 72 hours Thyroid Function Tests: No results found for this basename: TSH, T4TOTAL, FREET3, T3FREE, THYROIDAB,  in the last 72 hours Anemia Panel: No results found for this basename: VITAMINB12, FOLATE, FERRITIN, TIBC, IRON, RETICCTPCT,  in the last 72 hours  RADIOLOGY: Dg Chest 2 View  08/26/2012   *RADIOLOGY REPORT*  Clinical Data: Preop for dialysis catheter insertion  CHEST - 2 VIEW  Comparison: 10/28/2011  Findings: Cardiomegaly again noted.  Single lead cardiac pacemaker is unchanged in position.  No acute infiltrate or pleural effusion. No pulmonary edema.  Bony thorax is stable.  IMPRESSION:  No active disease.  Cardiomegaly again noted.  Single lead  cardiac pacemaker in place.   Original Report Authenticated By: Natasha Mead, M.D.   Dg Chest Port 1 View  09/10/2012   *RADIOLOGY REPORT*  Clinical Data: Lower extremity swelling.  Weakness and dizziness.  PORTABLE CHEST - 1 VIEW  Comparison: 08/31/2012.  Findings: The pacer wire / AICD is stable.  The dialysis catheter is unchanged.  The heart is enlarged and there is vascular congestion and pulmonary edema.  No definite pleural effusion.  IMPRESSION: Cardiac enlargement and pulmonary edema suggesting CHF.  No definite pleural effusion.   Original Report Authenticated By: Rudie Meyer, M.D.   Dg Chest Port 1 View  08/31/2012   *RADIOLOGY REPORT*  Clinical Data: Status post hemodialysis catheter placement  PORTABLE CHEST - 1 VIEW  Comparison: Prior chest x-ray 08/26/2012  Findings: Interval placement of a right IJ  approach tunneled hemodialysis catheter.  The catheter tips project over the superior cavoatrial junction.  No evidence of pneumothorax.  Enlargement the cardiopericardial silhouette with pulmonary vascular congestion and mild interstitial edema.  Stable left subclavian approach cardiac rhythm maintenance device with the single lead projecting over the right ventricle.  No acute osseous abnormality.  IMPRESSION:  1.  Right IJ approach tunneled hemodialysis catheter with the tips projecting over the superior cavoatrial junction. 2.  Cardiomegaly with mild CHF versus volume overload.   Original Report Authenticated By: Malachy Moan, M.D.   Dg Foot Complete Left  09/12/2012   *RADIOLOGY REPORT*  Clinical Data: Left toe infection, left dorsal foot ulcer for 1 month, rule out osteomyelitis left big toe  LEFT FOOT - COMPLETE 3+ VIEW  Comparison: None.  Findings: Extensive small vessel calcification.  No fracture dislocation.  No periosteal reaction or cortical destruction.  IMPRESSION: No evidence for osteomyelitis.   Original Report Authenticated By: Esperanza Heir, M.D.   Dg Fluoro Guide Cv Line-no Report  08/31/2012   CLINICAL DATA: Dialysis Catheter Placement   FLOURO GUIDE CV LINE  Fluoroscopy was utilized by the requesting physician.  No radiographic  interpretation.     PHYSICAL EXAM General: NAD Neck: JVP 10 cm, no thyromegaly or thyroid nodule.  Lungs: Clear to auscultation bilaterally with normal respiratory effort. CV: Nondisplaced PMI.  Heart regular S1/S2, no S3/S4, no murmur.  Legs wrapped with mild edema to knees.   Abdomen:  Obese Soft, nontender, no hepatosplenomegaly, no distention.  Neurologic: Alert and oriented x 3.  Psych: Normal affect. Extremities: No clubbing or cyanosis.  TELEMETRY: Reviewed telemetry pt in NSR, runs of atrial fibrillation with RVR last night.   ASSESSMENT AND PLAN: 47 yo with HTN/DM, nonischemic cardiomyopathy, and baseline severe CKD was admitted with  acute on chronic systolic CHF and progression to ESRD.  1. CHF: Acute on chronic systolic. Nonischemic cardiomyopathy with last EF 25%. Etiology uncertain: No ETOH or drug abuse, normal coronaries in 2006 (after CMP identified). Cannot rule out familial CMP, father died at 1 of uncertain cause. 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 He remains volume overloaded. Blood pressure is soft, with SBP in 100s. This will limit medication titration, especially as he will be getting dialysis.  - Ongoing HD for fluid removal.  He has lost considerable weight in the hospital.  Still some volume overload but improved.    - Continue current dose of Toprol XL.  - For now, hold off on ACEI, Bidil given lack of BP room.  - Needs followup with CHF clinic after discharge.  2. Atrial arrhythmias: Atrial fibrillation and possibly flutter have been noted paroxysmally.  BP drops when in atrial fibrillation with RVR.  In NSR.  - amiodarone 400 mg bid x 1 week, then 200 mg bid x 1 week, then 200 mg daily.  3. ESRD: Per renal. Volume status improvng 4. NSVT: Follow, no prolonged events by ICD interrogation.  5. 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  Still awaiting repeat SRA.  If negative again, can use heparin per hematology.   Should be able to go home today.  SRA will need to be followed up upon, probably by nephrology as they will want to give heparin with HD if possible.   Marca Ancona 09/26/2012

## 2012-09-27 DIAGNOSIS — Z888 Allergy status to other drugs, medicaments and biological substances status: Secondary | ICD-10-CM

## 2012-09-27 LAB — RENAL FUNCTION PANEL
CO2: 25 mEq/L (ref 19–32)
Calcium: 8.8 mg/dL (ref 8.4–10.5)
Creatinine, Ser: 4.41 mg/dL — ABNORMAL HIGH (ref 0.50–1.35)
GFR calc Af Amer: 17 mL/min — ABNORMAL LOW (ref >90)
GFR calc non Af Amer: 15 mL/min — ABNORMAL LOW (ref >90)
Glucose, Bld: 139 mg/dL — ABNORMAL HIGH (ref 70–99)
Phosphorus: 3.5 mg/dL (ref 2.3–4.6)
Sodium: 135 mEq/L (ref 135–145)

## 2012-09-27 LAB — GLUCOSE, CAPILLARY: Glucose-Capillary: 176 mg/dL — ABNORMAL HIGH (ref 70–99)

## 2012-09-27 MED ORDER — WHITE PETROLATUM GEL
Status: AC
  Administered 2012-09-27: 0.2
  Filled 2012-09-27: qty 5

## 2012-09-27 MED ORDER — METOPROLOL SUCCINATE ER 25 MG PO TB24: 25.0000 mg | ORAL_TABLET | Freq: Two times a day (BID) | ORAL | Status: DC

## 2012-09-27 MED ORDER — WARFARIN SODIUM 7.5 MG PO TABS
7.5000 mg | ORAL_TABLET | Freq: Once | ORAL | Status: DC
  Filled 2012-09-27: qty 1

## 2012-09-27 MED ORDER — AMIODARONE HCL 400 MG PO TABS: 200.0000 mg | ORAL_TABLET | Freq: Every day | ORAL | Status: DC

## 2012-09-27 MED ORDER — WARFARIN SODIUM 2.5 MG PO TABS: 7.5000 mg | ORAL_TABLET | Freq: Every day | ORAL | Status: DC

## 2012-09-27 NOTE — Progress Notes (Signed)
SATURATION QUALIFICATIONS:   Patient Saturations on Room Air at Rest = 90% Patient Saturations on Room Air while Ambulating = 84% Patient Saturations on 2 Liters of oxygen while Ambulating = 86%   Please briefly explain why patient needs home oxygen: Pt desaturates when ambulating

## 2012-09-27 NOTE — Progress Notes (Signed)
Outpatient dialysis arrangements discussed with pt. Pt has contacted the medicaid transport service to take him to the dialysis center tomorrow am. Pt is first shift. I contacted the OP dialysis center [facility]) to see if pt could come by first thing tomorrow and complete paperwork prior to getting on the machine, and they said this is ok, since he cannot fill out paperwork today due to the holiday. Pt notified of this.

## 2012-09-27 NOTE — Progress Notes (Signed)
ANTICOAGULATION CONSULT NOTE - Follow Up Consult  Pharmacy Consult for Coumadin Indication: New onset Afib, possible HIT  Allergies  Allergen Reactions  . Argatroban Other (See Comments)    Ventricular tachycardia  . Ace Inhibitors     Acute renal failure with multiple trials  . Heparin     ? HIT   Patient Measurements: Height: 6\' 1"  (185.4 cm) Weight: 318 lb 12.6 oz (144.6 kg) IBW/kg (Calculated) : 79.9 Heparin Dosing Weight: 115 kg  Vital Signs: Temp: 98.1 F (36.7 C) (09/01 0943) Temp src: Oral (09/01 0943) BP: 121/97 mmHg (09/01 0943) Pulse Rate: 74 (09/01 0943)  Labs:  Recent Labs  09/25/12 0500 09/25/12 1053 09/26/12 0500 09/27/12 0500  HGB 7.9*  --  8.6*  --   HCT 25.8*  --  29.0*  --   PLT 264  --  273  --   APTT >200*  --  51*  --   LABPROT 30.1* 22.8* 22.2* 25.7*  INR 3.00* 2.09* 2.02* 2.44*  CREATININE 4.18*  --  3.44* 4.41*   Estimated Creatinine Clearance: 30.7 ml/min (by C-G formula based on Cr of 4.41).  Assessment: 47 y.o. M with complicated PMH including ESRD, new onset Afib and heart failure. The patient was receiving heparin SQ for VTE px and with hemodilaysis starting on 8/14. On 8/18, the patient was switched to IV heparin for new-onset Afib. Platelets dropped >50% by 8/22 and a HIT panel was sent off. The patient was started on argatroban and on 8/23 experienced VT episodes, QT noted to be prolonged. Pharmacy consulted asked to switch to bivalirudin in light of the rare incidence of argatroban causing VT and the patients notably elevated aPTT despite multiple aggressive rate decreases.   The HIT panel resulted and is showing antibody positive and SRA negative. The antibody test has high sensitivity (~95%) and low specificity (60-80%). SRA is considered the *gold standard* with both a high sensitivity (88-100%) and specificity (89-100%). Additional testing report the optical density to be 2.694 which increases the chance that this is true HIT  despite the SRA being negative. The IM team consulted hematology who recommended to check a repeat SRA and continue Angiomax and Coumadin until INR 2-3 (2.5 per Cardiology) then Coumadin alone.   No CBC today.  His INR is trending nicely and today is at 2.44, nearing the goal of 2.5 per cardiology service.  Amiodarone started 8/28 which may increase warfarin sensitivity.  Goal of Therapy:  INR 2-3 (2.5 per cards) Monitor platelets by anticoagulation protocol: Yes  Plan:  -Coumadin 7.5 mg PO tonight -INR daily  Nadara Mustard, PharmD., MS Clinical Pharmacist Pager:  [phone] Thank you for allowing pharmacy to be part of this patients care team. 09/27/2012 12:57 PM

## 2012-09-27 NOTE — Progress Notes (Signed)
Discharge information and paperwork given to patient.  Instructions for transportation and dialysis tomorrow given.  All questions answered.  Tele discontinued, no IV present. Assessment unchanged from morning.  Patient wheeled down to car in wheelchair by volunteer.

## 2012-09-27 NOTE — Progress Notes (Signed)
Subjective: Interval History: has complaints concern of transportation in am.  Objective: Vital signs in last 24 hours: Temp:  [98.1 F (36.7 C)-99.5 F (37.5 C)] 98.1 F (36.7 C) (09/01 0943) Pulse Rate:  [73-89] 74 (09/01 0943) Resp:  [18-19] 18 (09/01 0943) BP: (88-121)/(50-97) 121/97 mmHg (09/01 0943) SpO2:  [90 %-100 %] 100 % (09/01 0943) Weight change:   Intake/Output from previous day: 08/31 0701 - 09/01 0700 In: 720 [P.O.:720] Out: -  Intake/Output this shift: Total I/O In: 240 [P.O.:240] Out: -   General appearance: alert, cooperative and morbidly obese Resp: diminished breath sounds bilaterally Chest wall: no tenderness, PC Cardio: S1, S2 normal and systolic murmur: holosystolic 2/6, blowing at apex GI: massive obese, pos bs Extremities: AVF RFA, B&T, dressings on legs, 3+edema  Lab Results:  Recent Labs  09/25/12 0500 09/26/12 0500  WBC 8.7 8.8  HGB 7.9* 8.6*  HCT 25.8* 29.0*  PLT 264 273   BMET:  Recent Labs  09/26/12 0500 09/27/12 0500  NA 134* 135  K 2.9* 3.5  CL 94* 97  CO2 27 25  GLUCOSE 126* 139*  BUN 19 32*  CREATININE 3.44* 4.41*  CALCIUM 9.1 8.8   No results found for this basename: PTH,  in the last 72 hours Iron Studies: No results found for this basename: IRON, TIBC, TRANSFERRIN, FERRITIN,  in the last 72 hours  Studies/Results: No results found.  I have reviewed the patient's current medications.  Assessment/Plan: 1 ESRD HD TTS [place]. Set up with meds wgts etc 2 Anemia give epo outpatient 3 DM controlled 4 Obesity 5 AFIB ???cont anticoag risk vs benefit 6 OSA 7 HTPH vit D P HD, epo, set up    LOS: 18 days   Balbina Depace L 09/27/2012,10:52 AM

## 2012-09-27 NOTE — Progress Notes (Signed)
Subjective: Patient feels ready to go home today. He still feels SOB with exertion, but no SOB at rest. Per nurse, patient satting 90% on 3L O2 at rest, but desats to 84% with ambulation.   Objective: Vital signs in last 24 hours: Filed Vitals:   09/26/12 1829 09/26/12 2100 09/27/12 0523 09/27/12 0943  BP: 90/51 91/58 118/65 121/97  Pulse: 73 73 73 74  Temp: 98.7 F (37.1 C) 99.5 F (37.5 C) 98.4 F (36.9 C) 98.1 F (36.7 C)  TempSrc: Oral Oral Oral Oral  Resp: 18 19 19 18   Height:      Weight:      SpO2: 92% 90% 90% 100%    Intake/Output Summary (Last 24 hours) at 09/27/12 1156 Last data filed at 09/27/12 0800  Gross per 24 hour  Intake    720 ml  Output      0 ml  Net    720 ml   Physical Exam: Constitutional: Obese, NAD  Cardiovascular: RRR, S1 normal, S2 normal Pulmonary/Chest: Air entry equal bilaterally. Clear to auscultation bilaterally  Abdominal: Soft. Non-tender, non-distended, bowel sounds are normal, no masses, organomegaly, or guarding present.  Extremities: warm extremities, BLE wrapped with bandages; nonpitting edema to knees Neurological: A&O x3   Lab Results: Basic Metabolic Panel:  Recent Labs Lab 09/25/12 0500 09/26/12 0500 09/27/12 0500  NA 135 134* 135  K 3.2* 2.9* 3.5  CL 95* 94* 97  CO2 24 27 25   GLUCOSE 150* 126* 139*  BUN 27* 19 32*  CREATININE 4.18* 3.44* 4.41*  CALCIUM 8.8 9.1 8.8  MG 1.8 2.0  --   PHOS 4.3 3.1 3.5   Liver Function Tests:  Recent Labs Lab 09/26/12 0500 09/27/12 0500  ALBUMIN 3.2* 2.8*   CBC:  Recent Labs Lab 09/25/12 0500 09/26/12 0500  WBC 8.7 8.8  HGB 7.9* 8.6*  HCT 25.8* 29.0*  MCV 90.5 92.7  PLT 264 273   CBG:  Recent Labs Lab 09/26/12 0726 09/26/12 1148 09/26/12 1701 09/26/12 2201 09/27/12 0750 09/27/12 1133  GLUCAP 118* 182* 109* 148* 125* 176*   Coagulation:  Recent Labs Lab 09/25/12 0500 09/25/12 1053 09/26/12 0500 09/27/12 0500  LABPROT 30.1* 22.8* 22.2* 25.7*  INR  3.00* 2.09* 2.02* 2.44*   Urine Drug Screen: Drugs of Abuse     Component Value Date/Time   LABOPIA NEG 03/17/2006 1455   COCAINSCRNUR NEG 03/17/2006 1455   LABBENZ NEG 03/17/2006 1455   AMPHETMU NEG 03/17/2006 1455    Micro Results: Recent Results (from the past 240 hour(s))  MRSA PCR SCREENING     Status: None   Collection Time    09/18/12  7:47 PM      Result Value Range Status   MRSA by PCR NEGATIVE  NEGATIVE Final   Comment:            The GeneXpert MRSA Assay (FDA     approved for NASAL specimens     only), is one component of a     comprehensive MRSA colonization     surveillance program. It is not     intended to diagnose MRSA     infection nor to guide or     monitor treatment for     MRSA infections.   Medications: I have reviewed the patient's current medications. Scheduled Meds: . amiodarone  400 mg Oral BID  . atorvastatin  20 mg Oral q1800  . [START ON 09/28/2012] darbepoetin (ARANESP) injection - DIALYSIS  150 mcg  Intravenous Q Tue-HD  . doxercalciferol  2 mcg Intravenous Q T,Th,Sa-HD  . febuxostat  80 mg Oral Daily  . ferric gluconate (FERRLECIT/NULECIT) IV  125 mg Intravenous Q T,Th,Sa-HD  . insulin aspart  0-9 Units Subcutaneous TID WC  . insulin aspart  2 Units Subcutaneous TID WC  . insulin glargine  19 Units Subcutaneous QHS  . metoprolol succinate  25 mg Oral BID  . multivitamin  1 tablet Oral QHS  . pantoprazole  40 mg Oral Daily  . sodium chloride  3 mL Intravenous Q12H  . Warfarin - Pharmacist Dosing Inpatient   Does not apply q1800   Continuous Infusions: . sodium chloride 13 mL/hr at 09/22/12 0700   PRN Meds:.sodium chloride, sodium chloride, sodium chloride, acetaminophen, diclofenac sodium, docusate sodium, feeding supplement (NEPRO CARB STEADY), lidocaine (PF), lidocaine-prilocaine, LORazepam, pentafluoroprop-tetrafluoroeth, sodium chloride  Assessment/Plan:  #Thrombocytopenia - Resolved s/p discontinuing heparin. HIT Ab positive. SRA Neg  however OD>2 so HIT likely. IV Heparin started on 8/18 with thrombocytopenia on 8/19, stopped on 8/22.   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 Per hematology, Dr. Roanna Raider, repeat SRA (still pending). NO heparin, continue coumadin w/ INR goal of 2-3 - Per Dr. Lowell Guitar note - Spoke with Dr. Roanna Raider who states if repeat SRA test negative would be able to give heparin -INR 2.44 today -On coumadin since 8/25, Angiomax d/c'd 8/30. INR decreased from 3 to 2.09 w/in 4hrs yesterday, but INR 2.44 today. -Will need hematology f/u as outpatient  # Atrial Fibrallation - new onset in setting of volume overload (ESRD and CHF)  -No firing of AICD - no interrogation needed per cards  - Cardiology consult - rate control for now, cardioversion if needed  -Heart Failure consult - Transitioned metoprolol to Toprol XL 50mg  BID, no events per interrogation of device  -INR 2.44 today  -Per Dr. Shirlee Latch, amiodarone 400 mg bid x 1week, then 200mg  BID x1 week, then 200mg  daily to try to keep him in NSR &Toprol XL to 25 mg bid given initiation of amiodarone. Will need f/u in HF clinic at d/c.  # ESRD with severe volume overload refractory to diuretic therapy necessitating emergent HD, with right arm AV cimino fistula and perm cath  -Carb restricted diet with 1.0L fluid restriction  -Continue daily weight  -Monitor for hyperkalemia - K 3.5 today -PT - home health PT  -OT evaluation - needs home health OT, 3 in 1 bedside comode;Tub/shower bench (needs wide BSC and RW for over 300#) -[facility] outpatient [facility] . Will do 5 hours due to his size and instability with Txs. Will plan 3 days per week treatments, 5 hrs each.   #Dyspnea - Improved. Present with exertion, not at rest. Patient with h/o of CHF, anemia, and CKD  with ongoing dyspnea for past month due to fluid overload requiring emergent hemodialysis. Also with acute on chronic systolic/ diastolic failure- DOE likely multifactorial.  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 Left great toe with what appears to be an infected paronychia, recommend I & D of paronychia and antibiotics  -Obtain Xray left foot - no osteomyelitis  -Obtain ABI - R 1.41 L 1.39 - suggesting calcified vessels, waveforms of b/l LE WNL  -Ortho consult - Per Dr. Eulah Pont no surgical debridement at this time, continue wound care, finished course of keflex on 8/30 - Per Dr. Lajoyce Corners - wound care nurses to apply a Profore wrap 4 both lower extremities. To be d/c with compression wraps and f/u in office for wound care  -DUS bilateral LE - no DVT or SVT in bilateral lower extremities  -Wound care 8/29 - Will need compression wraps change 2x wk alternate days from his HD days. Will need medical MD to at some point have patient fitted for long term compression stockings for management of LE edema.   #DM c/b dyslipidemia - last HbA1c 9.1 on 07/27/2012  - Lantus 19U at night  - Novolog 2U, SSI for meal-time coverage  - Continue home statin  - Lipid panel 03/2012 - low HDL (26)   #Normocytic Anemia - Stable. Asymptomatic, most likely due to CKD. 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  The patient does have a current PCP Lars Masson, MD) and does need an [facility] hospital follow-up appointment after discharge.  The patient does not have transportation limitations that hinder transportation to clinic appointments.  .Services Needed at time of discharge: Y = Yes, Blank = No PT:   OT:   RN:   Equipment:   Other:     LOS: 18 days   Windell Hummingbird, MD 09/27/2012, 11:56 AM

## 2012-09-28 ENCOUNTER — Telehealth: Payer: Self-pay | Admitting: Hematology and Oncology

## 2012-09-28 ENCOUNTER — Telehealth: Payer: Self-pay | Admitting: *Deleted

## 2012-09-28 NOTE — Telephone Encounter (Signed)
PT SCHEDULED TO SEE TO DR. VICTOR 09/16 @ 10 FOR A HOSP F/U APPT.  DX- THROMBOCYTOPENIA WELCOME PACKET MAILED.

## 2012-09-28 NOTE — Telephone Encounter (Signed)
Pharmacy calls and ask if the metoprolol XL order is correct, being it is 2 daily instead of the usual 1 daily, pharmacist states insurance is asking for a prior auth. If this is truly correct. Please advise

## 2012-09-29 ENCOUNTER — Telehealth: Payer: Self-pay

## 2012-09-29 LAB — MISCELLANEOUS TEST

## 2012-09-29 NOTE — Telephone Encounter (Signed)
Spoke to pharmacist, brand name of metoprolol XL BID is covered by patient's insurance.

## 2012-09-29 NOTE — Progress Notes (Signed)
Pharmacy note: repeat SRA  A repeat SRA on 09/23/12 resulted as positive (78% release)   LMWH Low Dose, 0.1 U/mL: 78 % RELEASE  LMWH Low Dose, 1.0 U/mL: 7 % RELEASE  LMWH High Dose, 50 U/mL: 7 % RELEASE  The result is located under laboratory results>> other tests>> >>misc lab test   -The result has been reported to Dr. Lowell Guitar -Allergies have been updated  Harland German, Pharm D 09/29/2012 1:28 PM

## 2012-09-29 NOTE — Telephone Encounter (Signed)
C/D 09/29/12 for appt. 10/12/12

## 2012-10-01 ENCOUNTER — Ambulatory Visit: Payer: Medicaid Other

## 2012-10-04 ENCOUNTER — Encounter: Payer: Medicaid Other | Admitting: Internal Medicine

## 2012-10-05 ENCOUNTER — Encounter: Payer: Self-pay | Admitting: Internal Medicine

## 2012-10-11 ENCOUNTER — Other Ambulatory Visit: Payer: Self-pay | Admitting: Internal Medicine

## 2012-10-11 ENCOUNTER — Telehealth: Payer: Self-pay | Admitting: Internal Medicine

## 2012-10-11 MED ORDER — SIMVASTATIN 20 MG PO TABS: 20.0000 mg | ORAL_TABLET | Freq: Every evening | ORAL | Status: DC

## 2012-10-11 NOTE — Telephone Encounter (Signed)
S/W PT AND GVE NEW D/T TO SEE MD 09/26 @ 9 W/DR. CHISM CALENDAR MAILED.

## 2012-10-11 NOTE — Progress Notes (Signed)
Message from pharmacist regarding interaction btw Simva 40 and amiodarone. Decreased simva to 20.

## 2012-10-12 ENCOUNTER — Inpatient Hospital Stay: Payer: Medicaid Other

## 2012-10-12 ENCOUNTER — Ambulatory Visit: Payer: Medicaid Other

## 2012-10-13 ENCOUNTER — Ambulatory Visit (INDEPENDENT_AMBULATORY_CARE_PROVIDER_SITE_OTHER): Payer: Medicaid Other | Admitting: Nurse Practitioner

## 2012-10-13 ENCOUNTER — Encounter: Payer: Self-pay | Admitting: Nurse Practitioner

## 2012-10-13 ENCOUNTER — Ambulatory Visit (INDEPENDENT_AMBULATORY_CARE_PROVIDER_SITE_OTHER): Payer: Medicaid Other | Admitting: *Deleted

## 2012-10-13 ENCOUNTER — Ambulatory Visit (INDEPENDENT_AMBULATORY_CARE_PROVIDER_SITE_OTHER): Payer: Medicaid Other | Admitting: Pharmacist

## 2012-10-13 VITALS — BP 100/60 | HR 99 | Ht 73.0 in | Wt 316.1 lb

## 2012-10-13 DIAGNOSIS — Z7901 Long term (current) use of anticoagulants: Secondary | ICD-10-CM

## 2012-10-13 DIAGNOSIS — I4891 Unspecified atrial fibrillation: Secondary | ICD-10-CM

## 2012-10-13 DIAGNOSIS — Z79899 Other long term (current) drug therapy: Secondary | ICD-10-CM

## 2012-10-13 DIAGNOSIS — I5022 Chronic systolic (congestive) heart failure: Secondary | ICD-10-CM

## 2012-10-13 DIAGNOSIS — Z95810 Presence of automatic (implantable) cardiac defibrillator: Secondary | ICD-10-CM

## 2012-10-13 LAB — CBC WITH DIFFERENTIAL/PLATELET
Basophils Absolute: 0.1 10*3/uL (ref 0.0–0.1)
Basophils Relative: 1.1 % (ref 0.0–3.0)
Eosinophils Absolute: 0 10*3/uL (ref 0.0–0.7)
Eosinophils Relative: 0 % (ref 0.0–5.0)
HCT: 27.9 % — ABNORMAL LOW (ref 39.0–52.0)
Hemoglobin: 8.7 g/dL — ABNORMAL LOW (ref 13.0–17.0)
Lymphocytes Relative: 16.8 % (ref 12.0–46.0)
Lymphs Abs: 1.2 10*3/uL (ref 0.7–4.0)
MCHC: 31.3 g/dL (ref 30.0–36.0)
MCV: 89.5 fl (ref 78.0–100.0)
Monocytes Absolute: 0.7 10*3/uL (ref 0.1–1.0)
Monocytes Relative: 9.6 % (ref 3.0–12.0)
Neutro Abs: 5 10*3/uL (ref 1.4–7.7)
Neutrophils Relative %: 72.5 % (ref 43.0–77.0)
Platelets: 331 10*3/uL (ref 150.0–400.0)
RBC: 3.12 Mil/uL — ABNORMAL LOW (ref 4.22–5.81)
RDW: 21.2 % — ABNORMAL HIGH (ref 11.5–14.6)
WBC: 6.9 10*3/uL (ref 4.5–10.5)

## 2012-10-13 LAB — BASIC METABOLIC PANEL
BUN: 42 mg/dL — ABNORMAL HIGH (ref 6–23)
CO2: 28 mEq/L (ref 19–32)
Calcium: 8.8 mg/dL (ref 8.4–10.5)
Chloride: 98 mEq/L (ref 96–112)
Creatinine, Ser: 4.9 mg/dL (ref 0.4–1.5)
GFR: 16.42 mL/min — ABNORMAL LOW (ref >60.00)
Glucose, Bld: 110 mg/dL — ABNORMAL HIGH (ref 70–99)
Potassium: 3.9 mEq/L (ref 3.5–5.1)
Sodium: 136 mEq/L (ref 135–145)

## 2012-10-13 LAB — HEPATIC FUNCTION PANEL
ALT: 12 U/L (ref 0–53)
AST: 20 U/L (ref 0–37)
Albumin: 2.9 g/dL — ABNORMAL LOW (ref 3.5–5.2)
Alkaline Phosphatase: 124 U/L — ABNORMAL HIGH (ref 39–117)
Bilirubin, Direct: 1.3 mg/dL — ABNORMAL HIGH (ref 0.0–0.3)
Total Bilirubin: 2.6 mg/dL — ABNORMAL HIGH (ref 0.3–1.2)
Total Protein: 8.6 g/dL — ABNORMAL HIGH (ref 6.0–8.3)

## 2012-10-13 LAB — TSH: TSH: 5.95 u[IU]/mL — ABNORMAL HIGH (ref 0.35–5.50)

## 2012-10-13 NOTE — Progress Notes (Signed)
Adam [name] Date of Birth: 02-02-1965 Medical Record #[id number]  History of Present Illness: Adam [name] is seen back today for a post hospital visit. Seen for Adam [name]. He has ESRD, CHF, sleep apnea, uncontrolled DM, CM with an EF of 25% with ICD in place and gout. Remote cath showed normal coronaries.   Most recently referred for admission for 50 pound weight gain - and the fluid retention necessitated starting dialysis. He had 10 dialysis session with 31 liters of fluid removed and weight loss of 45 pounds. While there he was noted to be hypoxic - sent home on oxygen. Also developed atrial fib/flutter and VT with long QT- started on amiodarone. He required multiple medicine changes for hypotension and his arrhythmias. ICD check did not show any abnormal activity. 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 He is to be on amiodarone 400 mg BID for 2 weeks and then 200 mg BID. Other issues included a left foot wound - to see Adam [name]. Had elevated PTH and started on hectorol. Remains anemic and was to start on weekly Aranesp and IV iron thru nephrology service. His INR is to be followed by Adam [name].  Comes in today. Here alone. Has been home about 2 weeks. Says he is slowly getting stronger. Dialysis has been "hard" on him but he is slowly improving. Breathing is better. Swelling still present but improving. Not dizzy. No shocks. No chest pain. Says he is taking his medicines. Has no clue why he is to go to the [facility]. Has not followed up with Adam [name] about his foot wound - which he says is improving. Not called for his primary care follow up.    Current Outpatient Prescriptions  Medication Sig Dispense Refill  . acetaminophen (TYLENOL) 325 MG tablet Take 650 mg by mouth every 6 (six) hours as needed for  pain.      Marland Kitchen amiodarone (PACERONE) 400 MG tablet Take 0.5 tablets (200 mg total) by mouth daily.  120 tablet  1  . colchicine 0.6 MG tablet Take 0.6 mg by mouth See admin instructions. Takes every three days.      . diclofenac sodium (VOLTAREN) 1 % GEL Apply 2 g topically 4 (four) times daily.       Marland Kitchen esomeprazole (NEXIUM) 20 MG capsule Take 1 capsule (20 mg total) by mouth daily before breakfast.  30 capsule  11  . Febuxostat (ULORIC) 80 MG TABS Take 1 tablet by mouth daily.      . fluticasone (FLONASE) 50 MCG/ACT nasal spray Place 1 spray into the nose daily as needed for allergies. For allergies      . insulin glargine (LANTUS) 100 UNIT/ML injection Inject 62 Units into the skin at bedtime.      Marland Kitchen loratadine (CLARITIN) 10 MG tablet Take 10 mg by mouth as needed.       Marland Kitchen LORazepam (ATIVAN) 0.5 MG tablet TAKE 1 TABLET TWICE A DAY AS NEEDED  60 tablet  1  . metoprolol succinate (TOPROL-XL) 25 MG 24 hr tablet Take 25 mg by mouth daily.      . traMADol (ULTRAM-ER) 100 MG 24 hr tablet Take 100 mg by mouth every 6 (six) hours as needed for pain.      Marland Kitchen warfarin (COUMADIN) 2.5 MG tablet Take 3 tablets (7.5 mg  total) by mouth daily.  90 tablet  1   No current facility-administered medications for this visit.    Allergies  Allergen Reactions  . Argatroban Other (See Comments)    Ventricular tachycardia  . Ace Inhibitors     Acute renal failure with multiple trials  . Heparin     Thrombcytopenia. SRA positive. See miscellaneous test (SRA results)    Past Medical History  Diagnosis Date  . Other and unspecified hyperlipidemia   . Insomnia, unspecified   . Obstructive sleep apnea (adult) (pediatric)   . Allergic rhinitis, cause unspecified   . Nonischemic cardiomyopathy     s/p St. Jude ICD  . Unspecified essential hypertension   . Type II or unspecified type diabetes mellitus without mention of complication, not stated as uncontrolled   . CKD (chronic kidney disease) stage 3, GFR 30-59  ml/min   . Esophageal reflux   . Morbid obesity   . Dysuria   . Tobacco use disorder   . Dysrhythmia   . Pacemaker   . Antral ulcer   . Renal azotemia   . Arthritis     Gout w/hyperuricemia  . Morbid obesity     Past Surgical History  Procedure Laterality Date  . Cardiac defibrillator placement  2011    St. Jude  . Esophagogastroduodenoscopy  01/03/2011    Procedure: ESOPHAGOGASTRODUODENOSCOPY (EGD);  Surgeon: Vertell Novak., MD;  Location: [facility] ENDOSCOPY;  Service: Endoscopy;  Laterality: N/A;  . Av fistula placement Right 08/31/2012    Procedure: ARTERIOVENOUS (AV) FISTULA CREATION;  Surgeon: Chuck Hint, MD;  Location: [facility] OR;  Service: Vascular;  Laterality: Right;  . Insertion of dialysis catheter Right 08/31/2012    Procedure: INSERTION OF DIALYSIS CATHETER-Right Internal Jugular Placement;  Surgeon: Chuck Hint, MD;  Location: [facility] OR;  Service: Vascular;  Laterality: Right;    History  Smoking status  . Current Every Day Smoker -- 0.50 packs/day for .5 years  . Types: Cigarettes  Smokeless tobacco  . Never Used    Comment: started smoking at age 27. Will stop on his own - cutting back now- was smoking a ppd - now only 3 a day    History  Alcohol Use No    Comment: No alcohol x 1 month.    Family History  Problem Relation Age of Onset  . Diabetes Mother   . Microcephaly Father   . Lung cancer Father     Review of Systems: The review of systems is per the HPI.  All other systems were reviewed and are negative.  Physical Exam: BP 100/60  Pulse 99  Ht 6\' 1"  (1.854 m)  Wt 316 lb 1.9 oz (143.391 kg)  BMI 41.72 kg/m2 Patient is alert and in no acute distress. Looks much older than his stated age. He is morbidly obese. Skin is warm and dry. Color is normal.  HEENT is unremarkable but eyes look a little jaundiced. Normocephalic/atraumatic. PERRL. Sclera are nonicteric. Neck is supple. No masses. No JVD. Lungs are clear. Cardiac exam shows a  regular rate and rhythm but it is a little fast. Abdomen is soft. Extremities are full and with 2+ edema. Gait and ROM are intact. Using a walker. No gross neurologic deficits noted.  LABORATORY DATA: PENDING  Lab Results  Component Value Date   WBC 8.8 09/26/2012   HGB 8.6* 09/26/2012   HCT 29.0* 09/26/2012   PLT 273 09/26/2012   GLUCOSE 139* 09/27/2012   CHOL 60 04/15/2012  TRIG 49 04/15/2012   HDL 26* 04/15/2012   LDLCALC 24 04/15/2012   ALT <5 09/18/2012   AST 13 09/18/2012   NA 135 09/27/2012   K 3.5 09/27/2012   CL 97 09/27/2012   CREATININE 4.41* 09/27/2012   BUN 32* 09/27/2012   CO2 25 09/27/2012   TSH 2.17 01/23/2011   INR 2.44* 09/27/2012   HGBA1C 9.1 07/27/2012   MICROALBUR 19.31* 02/10/2012   Echo Study Conclusions from December 2013  - Left ventricle: Tech. difficult study. The cavity size was moderately dilated. Wall thickness was increased in a pattern of mild LVH. The estimated ejection fraction was 25%. Diffuse hypokinesis. Findings consistent with left ventricular diastolic dysfunction. - Mitral valve: Mild regurgitation. - Left atrium: The atrium was moderately to severely dilated. - Right ventricle: The cavity size was moderately to severely dilated. Pacer wire or catheter noted in right ventricle. Systolic function was moderately to severely reduced. - Right atrium: The atrium was moderately dilated. - Tricuspid valve: Moderate regurgitation. - Pulmonary arteries: PA peak pressure: 46mm Hg (S).  Assessment / Plan:  1. ESRD - now on dialysis - followed by Dr. Lowell Guitar.   2. Nonischemic CM - EF of 25% - has ICD in place - checked today - no VT episodes. Only has a single lead device.   3. Atrial fib/flutter/VT during recent hospitalization - on amiodarone - EKG today I think shows sinus with a long PR - will need to have reviewed. Regardless, I have left him on his current regimen for now. He needs follow up labs today. Has missed his coumadin check already - will get done  here today. Compliance already looks to be an issue.   4. Uncontrolled DM - needs to get to his PCP - at the outpatient clinic at [facility].   5. Anemia - says he is continuing to get shots for his counts.   We will check labs today. See him back in 2 weeks on a day with Dr. Tenny Craw if possible - need a long term plan outlined for him.   Patient is agreeable to this plan and will call if any problems develop in the interim.   Rosalio Macadamia, RN, ANP-C [facility] [address] [place], [place]  [address]

## 2012-10-13 NOTE — Patient Instructions (Addendum)
We will start checking your coumadin level - this has to be checked regularly.   We need to check labs today  We need to check your ICD today  Stay on your current medicines for now  I need to see you in 2 to 3 weeks on a day that Dr. Tenny Craw is here  Call the [facility] office at [phone] if you have any questions, problems or concerns.

## 2012-10-15 LAB — ICD DEVICE OBSERVATION
DEV-0020ICD: NEGATIVE
DEVICE MODEL ICD: 708237

## 2012-10-15 NOTE — Progress Notes (Signed)
Single chamber ICD, unknown for Afib. OptiVol up 09/15/12 x11 days.  ROV w/ device clinic 11/29/12.

## 2012-10-15 NOTE — Addendum Note (Signed)
Addended by: Glenda Chroman on: 10/15/2012 06:01 PM   Modules accepted: Level of Service

## 2012-10-18 ENCOUNTER — Ambulatory Visit (INDEPENDENT_AMBULATORY_CARE_PROVIDER_SITE_OTHER): Payer: Medicaid Other | Admitting: Pharmacist

## 2012-10-18 DIAGNOSIS — Z7901 Long term (current) use of anticoagulants: Secondary | ICD-10-CM

## 2012-10-18 DIAGNOSIS — I4891 Unspecified atrial fibrillation: Secondary | ICD-10-CM

## 2012-10-18 NOTE — Patient Instructions (Signed)
Patient instructed to take medications as defined in the Anti-coagulation Track section of this encounter.  Patient instructed to take today's dose.  Patient verbalized understanding of these instructions.    

## 2012-10-18 NOTE — Progress Notes (Signed)
Anti-Coagulation Progress Note  Adam [name] is a 47 y.o. male who is currently on an anti-coagulation regimen.    RECENT RESULTS: Recent results are below, the most recent result is correlated with a dose of 22.5 mg. per week: Lab Results  Component Value Date   INR 1.7 10/18/2012   INR 1.7 10/13/2012   INR 2.44* 09/27/2012    ANTI-COAG DOSE: Anticoagulation Dose Instructions as of 10/18/2012     [name] Tue Wed Thu Fri Sat   New Dose 5 mg 5 mg 5 mg 2.5 mg 5 mg 5 mg 2.5 mg       ANTICOAG SUMMARY: Anticoagulation Episode Summary   Current INR goal 2.0-3.0  Next INR check 10/25/2012  INR from last check 1.7! (10/18/2012)  Weekly max dose   Target end date   INR check location   Preferred lab   Send INR reminders to ANTICOAG IMP   Indications  Atrial fibrillation [427.31] Long term (current) use of anticoagulants [V58.61]        Comments amiodarone started 8/28        ANTICOAG TODAY: Anticoagulation Summary as of 10/18/2012   INR goal 2.0-3.0  Selected INR 1.7! (10/18/2012)  Next INR check 10/25/2012  Target end date    Indications  Atrial fibrillation [427.31] Long term (current) use of anticoagulants [V58.61]      Anticoagulation Episode Summary   INR check location    Preferred lab    Send INR reminders to ANTICOAG IMP   Comments amiodarone started 8/28      PATIENT INSTRUCTIONS: Patient Instructions  Patient instructed to take medications as defined in the Anti-coagulation Track section of this encounter.  Patient instructed to take today's dose.  Patient verbalized understanding of these instructions.       FOLLOW-UP Return in 7 days (on 10/25/2012) for Follow up INR at 0915.  Hulen Luster, III Pharm.D., CACP

## 2012-10-22 ENCOUNTER — Other Ambulatory Visit: Payer: Self-pay | Admitting: Internal Medicine

## 2012-10-22 ENCOUNTER — Ambulatory Visit (INDEPENDENT_AMBULATORY_CARE_PROVIDER_SITE_OTHER): Payer: Medicaid Other | Admitting: Internal Medicine

## 2012-10-22 ENCOUNTER — Ambulatory Visit (INDEPENDENT_AMBULATORY_CARE_PROVIDER_SITE_OTHER): Payer: Medicaid Other | Admitting: Pharmacist

## 2012-10-22 ENCOUNTER — Encounter: Payer: Self-pay | Admitting: Internal Medicine

## 2012-10-22 ENCOUNTER — Ambulatory Visit: Payer: Medicaid Other

## 2012-10-22 ENCOUNTER — Inpatient Hospital Stay: Payer: Medicaid Other

## 2012-10-22 VITALS — BP 102/66 | HR 91 | Temp 96.8°F | Ht 73.0 in | Wt 313.3 lb

## 2012-10-22 DIAGNOSIS — S91302D Unspecified open wound, left foot, subsequent encounter: Secondary | ICD-10-CM

## 2012-10-22 DIAGNOSIS — I5022 Chronic systolic (congestive) heart failure: Secondary | ICD-10-CM

## 2012-10-22 DIAGNOSIS — I4891 Unspecified atrial fibrillation: Secondary | ICD-10-CM

## 2012-10-22 DIAGNOSIS — Z7901 Long term (current) use of anticoagulants: Secondary | ICD-10-CM

## 2012-10-22 DIAGNOSIS — Z5189 Encounter for other specified aftercare: Secondary | ICD-10-CM

## 2012-10-22 DIAGNOSIS — E1129 Type 2 diabetes mellitus with other diabetic kidney complication: Secondary | ICD-10-CM

## 2012-10-22 DIAGNOSIS — N186 End stage renal disease: Secondary | ICD-10-CM

## 2012-10-22 LAB — BASIC METABOLIC PANEL
Calcium: 9.2 mg/dL (ref 8.4–10.5)
Glucose, Bld: 90 mg/dL (ref 70–99)
Potassium: 4.3 mEq/L (ref 3.5–5.3)
Sodium: 139 mEq/L (ref 135–145)

## 2012-10-22 LAB — GLUCOSE, CAPILLARY
Glucose-Capillary: 57 mg/dL — ABNORMAL LOW (ref 70–99)
Glucose-Capillary: 70 mg/dL (ref 70–99)
Glucose-Capillary: 100 mg/dL — ABNORMAL HIGH (ref 70–99)

## 2012-10-22 LAB — POCT INR: INR: 1.8

## 2012-10-22 MED ORDER — INSULIN GLARGINE 100 UNIT/ML ~~LOC~~ SOLN: 55.0000 [IU] | Freq: Every day | SUBCUTANEOUS | Status: DC

## 2012-10-22 NOTE — Progress Notes (Signed)
Anti-Coagulation Progress Note  Adam [name] is a 47 y.o. male who is currently on an anti-coagulation regimen.    RECENT RESULTS: Recent results are below, the most recent result is correlated with a dose of 5mg  x 2 days, followed by 2.5mg  x 1 day, INR was today (early, because he was here seeing PCP; he was scheduled for Monday). I elected to see him. INR only went to 1.8 from 1.7 on Monday. Will INCREASE to 7.5mg  warfarin (3 x 2.5mg  = 7.5mg ) today, tomorrow and Sunday, follow up INR on Monday to which patient agrees. Lab Results  Component Value Date   INR 1.80 10/22/2012   INR 1.7 10/18/2012   INR 1.7 10/13/2012    ANTI-COAG DOSE: Anticoagulation Dose Instructions as of 10/22/2012     [name] Tue Wed Thu Fri Sat   New Dose 7.5 mg 0 mg 0 mg 0 mg 0 mg 7.5 mg 7.5 mg    Description       Will INCREASE to 7.5mg  Friday, Saturday, Sunday, follow up INR on Monday after seen by Dr. Tenny Craw at [facility].        ANTICOAG SUMMARY: Anticoagulation Episode Summary   Current INR goal 2.0-3.0  Next INR check 10/25/2012  INR from last check 1.80! (10/22/2012)  Weekly max dose   Target end date   INR check location   Preferred lab   Send INR reminders to ANTICOAG IMP   Indications  Atrial fibrillation [427.31] Long term (current) use of anticoagulants [V58.61]        Comments amiodarone started 8/28        ANTICOAG TODAY: Anticoagulation Summary as of 10/22/2012   INR goal 2.0-3.0  Selected INR 1.80! (10/22/2012)  Next INR check 10/25/2012  Target end date    Indications  Atrial fibrillation [427.31] Long term (current) use of anticoagulants [V58.61]      Anticoagulation Episode Summary   INR check location    Preferred lab    Send INR reminders to ANTICOAG IMP   Comments amiodarone started 8/28      PATIENT INSTRUCTIONS: Patient Instructions  Patient instructed to take medications as defined in the Anti-coagulation Track section of this encounter.  Patient instructed  to TAKE today's dose.  Patient verbalized understanding of these instructions.       FOLLOW-UP Return in 3 days (on 10/25/2012) for Follow up INR after seen by cardiology.  Hulen Luster, III Pharm.D., CACP

## 2012-10-22 NOTE — Progress Notes (Signed)
1055AM - CBG 57 - asymptomatic; stated took Insulin but did not eat breakfast.  OJ/crackers given. 1121AM - CBG 70 - asymptomatic.

## 2012-10-22 NOTE — Patient Instructions (Signed)
1. Please schedule a follow up appointment for 4-6 weeks.  2. Please take all medications as prescribed. Start taking 55 units of Lantus at bedtime. Check your blood sugars regularly and bring your meter to your next appointment. DO NOT TAKE INSULIN WITHOUT EATING A SNACK.  3. If you have worsening of your symptoms or new symptoms arise, please call the clinic ([phone]), or go to the ER immediately if symptoms are severe.  You have done a great job in taking all your medications. I appreciate it very much. Please continue doing that.

## 2012-10-22 NOTE — Progress Notes (Signed)
Patient ID: Adam [name], male   DOB: [birth year], 47 y.o.   MRN: [id number]  Subjective:   Patient ID: Adam [name] male   DOB: Dec 03, 1965 47 y.o.   MRN: [id number]  HPI: Adam [name] is a 47 y.o. male with past medical history of ESRD on HD (TTS), CHF with ejection fraction 25% (AICD), obstructive sleep apnea, hypertension, gout, and type 2 diabetes presenting today for hospital follow up. The patient was discharged on 09/27/12 after being admitted for severe volume overload 2/2 CHF and ESRD. 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   The patient returns to clinic today significantly improved since his last visit, losing 50 lbs since his admission. The patient has been going to outpatient HD (TTS) and says everything is going well. He says he feels much better and is able to ambulate normally, unlike his previous clinic visit. He denies any recent SOB, DOE, chest pain, and claims his leg swelling has been significantly reduced.   The patient has also been following with Dr. Alexandria Lodge in the anticoagulation clinic and was seen there today for follow up. appropriate changes were made to his Coumadin dose, as his INR was 1.8 today, only slightly increased from his previous INR of 1.7. The patient also follows with Dr. Tenny Craw for his cardiac issues and is going to see her on 11/29/12.  No further complaints today.   Past Medical History  Diagnosis Date  . Other and unspecified hyperlipidemia   . Insomnia, unspecified   . Obstructive sleep apnea (adult) (pediatric)   . Allergic rhinitis, cause unspecified   . Nonischemic cardiomyopathy     s/p St. Jude ICD  . Unspecified essential hypertension   . Type II or unspecified type diabetes mellitus without mention of complication, not stated as uncontrolled   . CKD (chronic kidney disease) stage 3, GFR 30-59 ml/min   . Esophageal reflux   . Morbid obesity   .  Dysuria   . Tobacco use disorder   . Dysrhythmia   . Pacemaker   . Antral ulcer   . Renal azotemia   . Arthritis     Gout w/hyperuricemia  . Morbid obesity    Current Outpatient Prescriptions  Medication Sig Dispense Refill  . acetaminophen (TYLENOL) 325 MG tablet Take 650 mg by mouth every 6 (six) hours as needed for pain.      Marland Kitchen amiodarone (PACERONE) 200 MG tablet Take 200 mg by mouth daily.      . colchicine 0.6 MG tablet Take 0.6 mg by mouth See admin instructions. Takes every three days.      . diclofenac sodium (VOLTAREN) 1 % GEL Apply 2 g topically 4 (four) times daily.       Marland Kitchen esomeprazole (NEXIUM) 20 MG capsule Take 1 capsule (20 mg total) by mouth daily before breakfast.  30 capsule  11  . Febuxostat (ULORIC) 80 MG TABS Take 1 tablet by mouth daily.      . fluticasone (FLONASE) 50 MCG/ACT nasal spray Place 1 spray into the nose daily as needed for allergies. For allergies      . insulin glargine (LANTUS) 100 UNIT/ML injection Inject 0.55 mLs (55 Units total) into the skin at bedtime.  10 mL  3  . loratadine (CLARITIN) 10 MG tablet Take 10 mg by mouth as needed.       Marland Kitchen LORazepam (ATIVAN) 0.5 MG tablet TAKE 1  TABLET TWICE A DAY AS NEEDED  60 tablet  1  . metoprolol succinate (TOPROL-XL) 25 MG 24 hr tablet Take 25 mg by mouth daily.      . traMADol (ULTRAM-ER) 100 MG 24 hr tablet Take 100 mg by mouth every 6 (six) hours as needed for pain.      Marland Kitchen warfarin (COUMADIN) 2.5 MG tablet Take 3 tablets (7.5 mg total) by mouth daily.  90 tablet  1   No current facility-administered medications for this visit.   Family History  Problem Relation Age of Onset  . Diabetes Mother   . Microcephaly Father   . Lung cancer Father    History   Social History  . Marital Status: Single    Spouse Name: N/A    Number of Children: N/A  . Years of Education: N/A   Social History Main Topics  . Smoking status: Current Every Day Smoker -- 0.20 packs/day    Types: Cigarettes  . Smokeless  tobacco: Never Used     Comment: 1-2 cigs/day.  . Alcohol Use: No     Comment: No alcohol x 1 month.  . Drug Use: No  . Sexual Activity: Not Currently   Other Topics Concern  . None   Social History Narrative  . None   Review of Systems: General: Denies fever, chills, diaphoresis, appetite change and fatigue.   Respiratory: Denies SOB, DOE, wheezing, or cough.  Cardiovascular: Denies chest pain, PND, orthopnea, palpitations, or leg swelling. Gastrointestinal: Denies nausea, vomiting, abdominal pain, diarrhea, constipation, blood in stool and abdominal distention.  Genitourinary: Denies dysuria, urgency, frequency, hematuria, flank pain and difficulty urinating.  Endocrine: Denies hot or cold intolerance, sweats, polyuria, polydipsia. Musculoskeletal: Denies myalgias, arthralgias and gait problem.  Skin: Denies pallor, rash and wounds.  Neurological: Denies dizziness, seizures, syncope, weakness, lightheadedness, numbness and headaches.  Hematological: Denies adenopathy,easy bruising, personal or family bleeding history.  Psychiatric/Behavioral: Denies mood changes, confusion, nervousness, sleep disturbance and agitation.  Objective:  Physical Exam: Filed Vitals:   10/22/12 1014  BP: 102/66  Pulse: 91  Temp: 96.8 F (36 C)  TempSrc: Oral  Height: 6\' 1"  (1.854 m)  Weight: 313 lb 4.8 oz (142.112 kg)  SpO2: 97%   Constitutional: Vital signs reviewed.  Overweight male, NAD. Alert and oriented x3.  Head: Normocephalic and atraumatic Ear: TM normal bilaterally. No erythema or effusions appreciated. Mouth: no erythema or exudates, MMM Eyes: PERRL, EOMI, conjunctivae normal Neck: Supple, Trachea midline normal ROM Cardiovascular: RRR, S1 normal, S2 normal, pulses symmetric and intact bilaterally.  Pulmonary/Chest: Air entry clear equal bilaterally. No wheezing, crackles or rales. Abdominal: Soft. Non-tender, non-distended, bowel sounds are normal, no masses, organomegaly, or  guarding present.  Musculoskeletal: No synovitis, effusions, or erythema of bilateral upper and lower appendicular joints  Neurological: A&O x3, Strength is normal and symmetric bilaterally, cranial nerve II-XII are grossly intact, no focal motor deficit, sensory intact to light touch bilaterally.  Skin: Chronic venous stasis changes of bilateral lower legs. 2+ pitting edema extending to mid-thigh. Varicosities present on medial portion of left ankle. Psychiatric: Normal mood and affect.  Assessment & Plan:   See problem based assessment and plan.

## 2012-10-22 NOTE — Patient Instructions (Signed)
Patient instructed to take medications as defined in the Anti-coagulation Track section of this encounter.  Patient instructed to TAKE today's dose.  Patient verbalized understanding of these instructions.     

## 2012-10-23 ENCOUNTER — Encounter: Payer: Self-pay | Admitting: Internal Medicine

## 2012-10-23 NOTE — Assessment & Plan Note (Signed)
Patient currently taking Lantus 62 units. Checks his blood sugars 2x daily, in the AM prior to breakfast and in PM at bedtime. Says he has had low blood sugars lately, average of 120 in the AM and 180 in the PM. No highs above low 200's according to the patient. Today, CBG was 90. Patient denies any recent hypoglycemia symptoms. Most recent HbA1c in 07/2012 was 9.1. -Decreased Lantus to 55 units.  -Will have patient follow up in 6-8 weeks to re-evaluate blood sugars and Lantus dose. Will repeat A1c at that time.

## 2012-10-23 NOTE — Assessment & Plan Note (Signed)
Patient's volume status significantly improved since last clinic visit, when he was admitted for >50 lb weight gain. Has been started on HD (TTS) without any problems. Says he feels much better since starting HD. Receives through catheter in right chest. Immature access in RUE, with thrill and bruit.  -Repeat BMET today shows no electrolyte abnormalities. Cr 4.49 and BUN 36.

## 2012-10-23 NOTE — Assessment & Plan Note (Signed)
Patient with open wound on foot, addressed during recent hospital admission. Ulceration 2/2 volume overload. Wound dressing changed by [facility] every other day. Looks clean, dry and intact. No erythema, exudate, or tenderness around site. Will continue with home wound care. No further intervention at this time as patient claims it is much improved since hospital admission.

## 2012-10-23 NOTE — Assessment & Plan Note (Addendum)
Patient with CHF (EF of 25%) w/ AICD. Follows with Dr. Tenny Craw. Patient denies any recent history of SOB, DOE, increased leg swelling, chest pain, palpitations, lightheadedness or dizziness. Volume status much improved since admission. 50 lb weight loss since 09/09/12 -Scheduled to follow up with cardiology on 11/28/12.

## 2012-10-25 ENCOUNTER — Ambulatory Visit: Payer: Medicaid Other | Admitting: Internal Medicine

## 2012-10-25 ENCOUNTER — Encounter: Payer: Self-pay | Admitting: Nurse Practitioner

## 2012-10-25 ENCOUNTER — Ambulatory Visit: Payer: Medicaid Other

## 2012-10-25 ENCOUNTER — Ambulatory Visit (INDEPENDENT_AMBULATORY_CARE_PROVIDER_SITE_OTHER): Payer: Medicaid Other | Admitting: Nurse Practitioner

## 2012-10-25 ENCOUNTER — Ambulatory Visit (INDEPENDENT_AMBULATORY_CARE_PROVIDER_SITE_OTHER): Payer: Medicaid Other | Admitting: Pharmacist

## 2012-10-25 VITALS — BP 100/64 | HR 94 | Ht 73.0 in | Wt 314.8 lb

## 2012-10-25 DIAGNOSIS — I4891 Unspecified atrial fibrillation: Secondary | ICD-10-CM

## 2012-10-25 DIAGNOSIS — Z7901 Long term (current) use of anticoagulants: Secondary | ICD-10-CM

## 2012-10-25 LAB — POCT INR: INR: 2.3

## 2012-10-25 NOTE — Progress Notes (Signed)
Adam [name] Date of Birth: May 30, 1965 Medical Record #[id number]  History of Present Illness: Adam [name] is seen back today for a 2 week check. Seen for Dr. Trinda Pascal. Has ESRD, CHF, sleep apnea, uncontrolled DM, CM with an EF of 25% with ICD in place and gout. Remote cath showed normal coronaries.   Most recently referred for admission for 50 pound weight gain - and the fluid retention necessitated starting dialysis. He had 10 dialysis session with 31 liters of fluid removed and weight loss of 45 pounds. While there he was noted to be hypoxic - sent home on oxygen. Also developed atrial fib/flutter and VT with long QT- started on amiodarone. He required multiple medicine changes for hypotension and his arrhythmias. ICD check did not show any abnormal activity. 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 He is to be on amiodarone 400 mg BID for 2 weeks and then 200 mg BID. Other issues included a left foot wound - to see Dr. Lajoyce Corners. Had elevated PTH and started on hectorol. Remains anemic and was to start on weekly Aranesp and IV iron thru nephrology service. His INR is to be followed by Dr. Alexandria Lodge.   I saw him 2 weeks ago - he was slowly getting stronger. Had not followed up with Dr. Lajoyce Corners about his foot wound - which he says is improving. Had not called for his primary care follow up. Had not had his coumadin checked. He was having dizzy spells but his device checked out ok. Was told to just do 200 mg of amiodarone a day.   Comes back today. Here alone. Doing much better. Feels stronger. Breathing better. Swelling is improved. Weight is ok. No chest pain. No palpitations. Not dizzy or lightheaded. Feels ok on his medicines.   Current Outpatient Prescriptions  Medication Sig Dispense Refill  . acetaminophen (TYLENOL) 325 MG  tablet Take 650 mg by mouth every 6 (six) hours as needed for pain.      Marland Kitchen amiodarone (PACERONE) 200 MG tablet Take 200 mg by mouth daily.      . colchicine 0.6 MG tablet Take 0.6 mg by mouth See admin instructions. Takes every three days.      . diclofenac sodium (VOLTAREN) 1 % GEL Apply 2 g topically 4 (four) times daily.       Marland Kitchen esomeprazole (NEXIUM) 20 MG capsule Take 1 capsule (20 mg total) by mouth daily before breakfast.  30 capsule  11  . Febuxostat (ULORIC) 80 MG TABS Take 1 tablet by mouth daily.      . fluticasone (FLONASE) 50 MCG/ACT nasal spray Place 1 spray into the nose daily as needed for allergies. For allergies      . insulin glargine (LANTUS) 100 UNIT/ML injection Inject 0.55 mLs (55 Units total) into the skin at bedtime.  10 mL  3  . loratadine (CLARITIN) 10 MG tablet Take 10 mg by mouth as needed.       Marland Kitchen LORazepam (ATIVAN) 0.5 MG tablet TAKE 1 TABLET TWICE A DAY AS NEEDED  60 tablet  1  . metoprolol succinate (TOPROL-XL) 25 MG 24 hr tablet Take 25 mg by mouth daily.      . traMADol (ULTRAM-ER) 100 MG 24 hr tablet Take 100 mg by mouth every 6 (six) hours as needed for pain.      Marland Kitchen  warfarin (COUMADIN) 2.5 MG tablet Take 3 tablets (7.5 mg total) by mouth daily.  90 tablet  1   No current facility-administered medications for this visit.    Allergies  Allergen Reactions  . Argatroban Other (See Comments)    Ventricular tachycardia  . Ace Inhibitors     Acute renal failure with multiple trials  . Heparin     Thrombcytopenia. SRA positive. See miscellaneous test (SRA results)    Past Medical History  Diagnosis Date  . Other and unspecified hyperlipidemia   . Insomnia, unspecified   . Obstructive sleep apnea (adult) (pediatric)   . Allergic rhinitis, cause unspecified   . Nonischemic cardiomyopathy     s/p St. Jude ICD  . Unspecified essential hypertension   . Type II or unspecified type diabetes mellitus without mention of complication, not stated as uncontrolled     . CKD (chronic kidney disease) stage 3, GFR 30-59 ml/min   . Esophageal reflux   . Morbid obesity   . Dysuria   . Tobacco use disorder   . Dysrhythmia   . Pacemaker   . Antral ulcer   . Renal azotemia   . Arthritis     Gout w/hyperuricemia  . Morbid obesity     Past Surgical History  Procedure Laterality Date  . Cardiac defibrillator placement  2011    St. Jude  . Esophagogastroduodenoscopy  01/03/2011    Procedure: ESOPHAGOGASTRODUODENOSCOPY (EGD);  Surgeon: Vertell Novak., MD;  Location: [facility] ENDOSCOPY;  Service: Endoscopy;  Laterality: N/A;  . Av fistula placement Right 08/31/2012    Procedure: ARTERIOVENOUS (AV) FISTULA CREATION;  Surgeon: Chuck Hint, MD;  Location: [facility] OR;  Service: Vascular;  Laterality: Right;  . Insertion of dialysis catheter Right 08/31/2012    Procedure: INSERTION OF DIALYSIS CATHETER-Right Internal Jugular Placement;  Surgeon: Chuck Hint, MD;  Location: [facility] OR;  Service: Vascular;  Laterality: Right;    History  Smoking status  . Current Every Day Smoker -- 0.20 packs/day  . Types: Cigarettes  Smokeless tobacco  . Never Used    Comment: 1-2 cigs/day.    History  Alcohol Use No    Comment: No alcohol x 1 month.    Family History  Problem Relation Age of Onset  . Diabetes Mother   . Microcephaly Father   . Lung cancer Father     Review of Systems: The review of systems is per the HPI.  All other systems were reviewed and are negative.  Physical Exam: BP 100/64  Pulse 94  Ht 6\' 1"  (1.854 m)  Wt 314 lb 12.8 oz (142.792 kg)  BMI 41.54 kg/m2 Patient is very pleasant and in no acute distress. He is obese. Weight down 2 pounds. Skin is warm and dry. Color is normal.  HEENT is unremarkable. Normocephalic/atraumatic. PERRL. Sclera are nonicteric. Neck is supple. No masses. No JVD. Lungs are clear. Cardiac exam shows a regular rate and rhythm. Abdomen is soft. Extremities are without edema. Gait and ROM are intact. No gross  neurologic deficits noted.  LABORATORY DATA: EKG today looks like sinus with a 1st degree AV block - tracing reviewed with Dr. Tenny Craw.   Lab Results  Component Value Date   WBC 6.9 10/13/2012   HGB 8.7 Repeated and verified X2.* 10/13/2012   HCT 27.9* 10/13/2012   PLT 331.0 10/13/2012   GLUCOSE 90 10/22/2012   CHOL 60 04/15/2012   TRIG 49 04/15/2012   HDL 26* 04/15/2012   LDLCALC 24  04/15/2012   ALT 12 10/13/2012   AST 20 10/13/2012   NA 139 10/22/2012   K 4.3 10/22/2012   CL 97 10/22/2012   CREATININE 4.49* 10/22/2012   BUN 36* 10/22/2012   CO2 28 10/22/2012   TSH 5.95* 10/13/2012   INR 1.80 10/22/2012   HGBA1C 9.1 07/27/2012   MICROALBUR 19.31* 02/10/2012     Assessment / Plan: 1. ESRD - now on dialysis - directed by Dr. Lowell Guitar   2. Nonischemic CM - EF of 25% - has a single lead ICD in place - for recheck in November.   3. Atrial fib/flutter/VT during recent hospitalization - on amiodarone and coumadin - looks to be in sinus. Clinically doing very well. Will need close check of his thyroid levels with the amiodarone.   4. Uncontrolled DM  5. Chronic anticoagulation - checked by Dr. Alexandria Lodge.   Patient subsequently seen with Dr. Tenny Craw - she is in agreement with the current plan. He does look better clinically.   Patient is agreeable to this plan and will call if any problems develop in the interim.   Rosalio Macadamia, RN, ANP-C [facility] [address] [place], [place]  [address]

## 2012-10-25 NOTE — Patient Instructions (Addendum)
I think you are doing better.  Stay on your current medicines  See Dr. Tenny Craw in 2 months  Call the [facility] office at [phone] if you have any questions, problems or concerns.

## 2012-10-25 NOTE — Patient Instructions (Signed)
Patient instructed to take medications as defined in the Anti-coagulation Track section of this encounter.  Patient instructed to take today's dose.  Patient verbalized understanding of these instructions.    

## 2012-10-25 NOTE — Progress Notes (Signed)
Anti-Coagulation Progress Note  Adam [name] is a 47 y.o. male who is currently on an anti-coagulation regimen.    RECENT RESULTS: Recent results are below, the most recent result is correlated with a dose of 22.5 for past 3 days (7.5mg  weighted daily dose).  Lab Results  Component Value Date   INR 2.30 10/25/2012   INR 1.80 10/22/2012   INR 1.7 10/18/2012    ANTI-COAG DOSE: Anticoagulation Dose Instructions as of 10/25/2012     [name] Tue Wed Thu Fri Sat   New Dose 7.5 mg 7.5 mg 6.25 mg 7.5 mg 6.25 mg 7.5 mg 7.5 mg       ANTICOAG SUMMARY: Anticoagulation Episode Summary   Current INR goal 2.0-3.0  Next INR check 11/01/2012  INR from last check 2.30 (10/25/2012)  Weekly max dose   Target end date   INR check location   Preferred lab   Send INR reminders to ANTICOAG IMP   Indications  Atrial fibrillation [427.31] Long term (current) use of anticoagulants [V58.61]        Comments amiodarone started 8/28        ANTICOAG TODAY: Anticoagulation Summary as of 10/25/2012   INR goal 2.0-3.0  Selected INR 2.30 (10/25/2012)  Next INR check 11/01/2012  Target end date    Indications  Atrial fibrillation [427.31] Long term (current) use of anticoagulants [V58.61]      Anticoagulation Episode Summary   INR check location    Preferred lab    Send INR reminders to ANTICOAG IMP   Comments amiodarone started 8/28      PATIENT INSTRUCTIONS: Patient Instructions  Patient instructed to take medications as defined in the Anti-coagulation Track section of this encounter.  Patient instructed to take today's dose.  Patient verbalized understanding of these instructions.       FOLLOW-UP Return in 7 days (on 11/01/2012) for Follow up INR at 0900h.  Hulen Luster, III Pharm.D., CACP

## 2012-10-25 NOTE — Progress Notes (Signed)
I saw and evaluated the patient.  I personally confirmed the key portions of the history and exam documented by Dr. Jones and I reviewed pertinent patient test results.  The assessment, diagnosis, and plan were formulated together and I agree with the documentation in the resident's note.   

## 2012-10-30 ENCOUNTER — Encounter: Payer: Self-pay | Admitting: Internal Medicine

## 2012-11-01 ENCOUNTER — Ambulatory Visit: Payer: Medicaid Other

## 2012-11-05 ENCOUNTER — Ambulatory Visit (INDEPENDENT_AMBULATORY_CARE_PROVIDER_SITE_OTHER): Payer: Medicaid Other

## 2012-11-05 DIAGNOSIS — Z7901 Long term (current) use of anticoagulants: Secondary | ICD-10-CM

## 2012-11-05 DIAGNOSIS — I4891 Unspecified atrial fibrillation: Secondary | ICD-10-CM

## 2012-11-08 ENCOUNTER — Telehealth: Payer: Self-pay | Admitting: Pharmacist

## 2012-11-08 NOTE — Telephone Encounter (Signed)
Patient arrived to [facility] on non-scheduled clinic day/date/time. I elected to see patient whereupon it was learned his INR = 5.3. I used this as a "teachable" moment (hopefully)--in that if he had come to clinic on Monday--and should the INR have been elevated 4 days prior--we could have made an intervention at that time. Patient verbalized an understanding of this concept and agreed to keep next scheduled appointment on day/date/time. Warfarin was HELD/OMITTED on 11-05-12 x 1 dose; weekly dose was reduced to 2 and 1/2 tablets (2.5mg  strength tablets) on all days of week except on Wednesdays--take 3x2.5mg  (7.5mg ) each Wednesday. RTC on Monday 10-OCT-14 at 1100h. He reports no bleeding of any kind or type, no new medications, etc.

## 2012-11-09 ENCOUNTER — Other Ambulatory Visit: Payer: Self-pay | Admitting: Vascular Surgery

## 2012-11-09 DIAGNOSIS — N186 End stage renal disease: Secondary | ICD-10-CM

## 2012-11-09 DIAGNOSIS — Z4931 Encounter for adequacy testing for hemodialysis: Secondary | ICD-10-CM

## 2012-11-10 ENCOUNTER — Other Ambulatory Visit: Payer: Self-pay | Admitting: *Deleted

## 2012-11-10 DIAGNOSIS — Z4931 Encounter for adequacy testing for hemodialysis: Secondary | ICD-10-CM

## 2012-11-10 DIAGNOSIS — N186 End stage renal disease: Secondary | ICD-10-CM

## 2012-11-11 ENCOUNTER — Other Ambulatory Visit: Payer: Self-pay | Admitting: *Deleted

## 2012-11-12 MED ORDER — LORATADINE 10 MG PO TABS: 10.0000 mg | ORAL_TABLET | ORAL | Status: DC | PRN

## 2012-11-15 ENCOUNTER — Ambulatory Visit (INDEPENDENT_AMBULATORY_CARE_PROVIDER_SITE_OTHER): Payer: Medicaid Other | Admitting: Pharmacist

## 2012-11-15 DIAGNOSIS — Z7901 Long term (current) use of anticoagulants: Secondary | ICD-10-CM

## 2012-11-15 DIAGNOSIS — I4891 Unspecified atrial fibrillation: Secondary | ICD-10-CM

## 2012-11-15 LAB — POCT INR: INR: 6.9

## 2012-11-15 NOTE — Patient Instructions (Signed)
Patient instructed to take medications as defined in the Anti-coagulation Track section of this encounter.  Patient instructed to take today's dose.  Patient verbalized understanding of these instructions.    

## 2012-11-15 NOTE — Progress Notes (Signed)
Anti-Coagulation Progress Note  Adam [name] is a 47 y.o. male who is currently on an anti-coagulation regimen.    RECENT RESULTS: Recent results are below, the most recent result is correlated with a dose of 50 mg. per week:  Will OMIT x 2 days; then decrease to 2x2.5mg  (5mg ) on all days except Sunday (take 2 and 1/2 x 2.5mg  warfarin tablets), see me in [facility] on Monday 27-OCT-14 at 1115h.  Lab Results  Component Value Date   INR 6.90 11/15/2012   INR 2.30 10/25/2012   INR 1.80 10/22/2012    ANTI-COAG DOSE: Anticoagulation Dose Instructions as of 11/15/2012     [name] Tue Wed Thu Fri Sat   New Dose 6.25 mg 5 mg 5 mg 5 mg 5 mg 5 mg 5 mg       ANTICOAG SUMMARY: Anticoagulation Episode Summary   Current INR goal 2.0-3.0  Next INR check 11/22/2012  INR from last check 6.90! (11/15/2012)  Weekly max dose   Target end date   INR check location   Preferred lab   Send INR reminders to ANTICOAG IMP   Indications  Atrial fibrillation [427.31] Long term (current) use of anticoagulants [V58.61]        Comments amiodarone started 8/28        ANTICOAG TODAY: Anticoagulation Summary as of 11/15/2012   INR goal 2.0-3.0  Selected INR 6.90! (11/15/2012)  Next INR check 11/22/2012  Target end date    Indications  Atrial fibrillation [427.31] Long term (current) use of anticoagulants [V58.61]      Anticoagulation Episode Summary   INR check location    Preferred lab    Send INR reminders to ANTICOAG IMP   Comments amiodarone started 8/28      PATIENT INSTRUCTIONS: Patient Instructions  Patient instructed to take medications as defined in the Anti-coagulation Track section of this encounter.  Patient instructed to taketoday's dose.  Patient verbalized understanding of these instructions.      FOLLOW-UP Return in 7 days (on 11/22/2012) for Follow up INR at 1115h.  Hulen Luster, III Pharm.D., CACP

## 2012-11-17 ENCOUNTER — Other Ambulatory Visit: Payer: Self-pay | Admitting: Internal Medicine

## 2012-11-18 ENCOUNTER — Other Ambulatory Visit: Payer: Self-pay | Admitting: *Deleted

## 2012-11-18 ENCOUNTER — Encounter: Payer: Self-pay | Admitting: Vascular Surgery

## 2012-11-18 ENCOUNTER — Other Ambulatory Visit: Payer: Self-pay | Admitting: Internal Medicine

## 2012-11-19 ENCOUNTER — Ambulatory Visit (HOSPITAL_COMMUNITY)
Admission: RE | Admit: 2012-11-19 | Discharge: 2012-11-19 | Disposition: A | Discharge status: Home / Self Care | Payer: Medicaid Other | Source: Ambulatory Visit | Attending: Vascular Surgery | Admitting: Vascular Surgery

## 2012-11-19 ENCOUNTER — Ambulatory Visit (INDEPENDENT_AMBULATORY_CARE_PROVIDER_SITE_OTHER): Payer: Medicaid Other | Admitting: Vascular Surgery

## 2012-11-19 ENCOUNTER — Encounter: Payer: Self-pay | Admitting: Vascular Surgery

## 2012-11-19 VITALS — BP 107/70 | HR 97 | Ht 73.0 in | Wt 314.6 lb

## 2012-11-19 DIAGNOSIS — N186 End stage renal disease: Secondary | ICD-10-CM | POA: Insufficient documentation

## 2012-11-19 DIAGNOSIS — Z4931 Encounter for adequacy testing for hemodialysis: Secondary | ICD-10-CM | POA: Insufficient documentation

## 2012-11-19 MED ORDER — TRAMADOL HCL 50 MG PO TABS: 100.0000 mg | ORAL_TABLET | Freq: Four times a day (QID) | ORAL | Status: DC | PRN

## 2012-11-19 MED ORDER — TRAMADOL HCL 50 MG PO TABS: 50.0000 mg | ORAL_TABLET | Freq: Four times a day (QID) | ORAL | Status: DC | PRN

## 2012-11-19 NOTE — Telephone Encounter (Signed)
Tramadol rx called to [facility] pharmacy - pt informed.

## 2012-11-19 NOTE — Progress Notes (Signed)
[facility] [place]  Postoperative Access Visit  History of Present Illness  Adam [name] is a 47 y.o. year old male who presents for postoperative follow-up for: R RC AVF, RIJ TDC (Date: 08/31/12).  The patient's wounds are healed.  The patient notes no steal symptoms.  The patient is able to complete their activities of daily living.  The patient's current symptoms are: none.  For VQI Use Only  PRE-ADM LIVING: Home  AMB STATUS: Ambulatory  Physical Examination Filed Vitals:   11/19/12 1325  BP: 107/70  Pulse: 97    RUE: Incision is healed, skin feels warm, hand grip is 5/5, sensation in digits is  intact, palpable thrill, bruit can  be auscultated , on sonosite: L RC AVF is 5.5 mm-8 mm in the forearm  R arm access duplex (11/19/2012)   Two small sidebranches in forearm  Two small sidebranches in upper arm  Valve near antecubitum  Medical Decision Making  Adam [name] is a 47 y.o. year old male who presents s/p R RC AVF.  The patient's access is ready for use .  The patient's tunneled dialysis catheter can be removed after two successful cannulations and completed dialysis treatments.  Thank you for allowing [place] to participate in this patient's care.  Leonides Sake, MD [facility] [place] Office: [phone] Pager: [phone]  11/19/2012, 1:50 PM

## 2012-11-19 NOTE — Telephone Encounter (Signed)
Called to pharm 

## 2012-11-22 ENCOUNTER — Ambulatory Visit (INDEPENDENT_AMBULATORY_CARE_PROVIDER_SITE_OTHER): Payer: Medicaid Other | Admitting: Pharmacist

## 2012-11-22 ENCOUNTER — Telehealth: Payer: Self-pay | Admitting: Dietician

## 2012-11-22 ENCOUNTER — Encounter: Payer: Self-pay | Admitting: Nephrology

## 2012-11-22 DIAGNOSIS — Z7901 Long term (current) use of anticoagulants: Secondary | ICD-10-CM

## 2012-11-22 DIAGNOSIS — I4891 Unspecified atrial fibrillation: Secondary | ICD-10-CM

## 2012-11-22 LAB — POCT INR: INR: 4.2

## 2012-11-22 NOTE — Progress Notes (Signed)
Indication: Atrial fibrillation.  Duration: Lifelong.  INR: Above target.  Agree with Dr. Groce's assessment and plan. 

## 2012-11-22 NOTE — Telephone Encounter (Signed)
Asked patient about recent eye exam for his diabetes. He says he will schedule one with Dr. [place] and ask them to fax [place] the results.

## 2012-11-22 NOTE — Patient Instructions (Signed)
Patient instructed to take medications as defined in the Anti-coagulation Track section of this encounter.  Patient instructed to OMIT today's dose.  Patient verbalized understanding of these instructions.    

## 2012-11-22 NOTE — Progress Notes (Signed)
Anti-Coagulation Progress Note  Adam [name] is a 47 y.o. male who is currently on an anti-coagulation regimen.    RECENT RESULTS: Recent results are below, the most recent result is correlated with a dose of 36.25 mg. per week: OMIT 1 dose (today) and decrease to 30mg /wk warfarin.  Lab Results  Component Value Date   INR 4.20 11/22/2012   INR 6.90 11/15/2012   INR 2.30 10/25/2012    ANTI-COAG DOSE: Anticoagulation Dose Instructions as of 11/22/2012     [name] Tue Wed Thu Fri Sat   New Dose 6.25 mg 5 mg 5 mg 5 mg 5 mg 5 mg 5 mg       ANTICOAG SUMMARY: Anticoagulation Episode Summary   Current INR goal 2.0-3.0  Next INR check 12/13/2012  INR from last check 4.20! (11/22/2012)  Weekly max dose   Target end date   INR check location   Preferred lab   Send INR reminders to ANTICOAG IMP   Indications  Atrial fibrillation [427.31] Long term (current) use of anticoagulants [V58.61]        Comments amiodarone started 8/28        ANTICOAG TODAY: Anticoagulation Summary as of 11/22/2012   INR goal 2.0-3.0  Selected INR 4.20! (11/22/2012)  Next INR check 12/13/2012  Target end date    Indications  Atrial fibrillation [427.31] Long term (current) use of anticoagulants [V58.61]      Anticoagulation Episode Summary   INR check location    Preferred lab    Send INR reminders to ANTICOAG IMP   Comments amiodarone started 8/28      PATIENT INSTRUCTIONS: Patient Instructions  Patient instructed to take medications as defined in the Anti-coagulation Track section of this encounter.  Patient instructed to OMIT today's dose.  Patient verbalized understanding of these instructions.       FOLLOW-UP Return in 3 weeks (on 12/13/2012) for Follow up INR at 1045h.  Hulen Luster, III Pharm.D., CACP

## 2012-11-22 NOTE — Telephone Encounter (Signed)
Rx called in 

## 2012-11-25 ENCOUNTER — Emergency Department (HOSPITAL_COMMUNITY)
Admission: EM | Admit: 2012-11-25 | Discharge: 2012-11-25 | Disposition: A | Discharge status: Home / Self Care | Payer: Medicaid Other | Attending: Emergency Medicine | Admitting: Emergency Medicine

## 2012-11-25 ENCOUNTER — Telehealth: Payer: Self-pay | Admitting: *Deleted

## 2012-11-25 ENCOUNTER — Encounter (HOSPITAL_COMMUNITY): Payer: Self-pay | Admitting: Emergency Medicine

## 2012-11-25 DIAGNOSIS — Z8669 Personal history of other diseases of the nervous system and sense organs: Secondary | ICD-10-CM | POA: Insufficient documentation

## 2012-11-25 DIAGNOSIS — K219 Gastro-esophageal reflux disease without esophagitis: Secondary | ICD-10-CM | POA: Insufficient documentation

## 2012-11-25 DIAGNOSIS — S91302D Unspecified open wound, left foot, subsequent encounter: Secondary | ICD-10-CM

## 2012-11-25 DIAGNOSIS — Z794 Long term (current) use of insulin: Secondary | ICD-10-CM | POA: Insufficient documentation

## 2012-11-25 DIAGNOSIS — IMO0002 Reserved for concepts with insufficient information to code with codable children: Secondary | ICD-10-CM | POA: Insufficient documentation

## 2012-11-25 DIAGNOSIS — M109 Gout, unspecified: Secondary | ICD-10-CM | POA: Insufficient documentation

## 2012-11-25 DIAGNOSIS — F17200 Nicotine dependence, unspecified, uncomplicated: Secondary | ICD-10-CM | POA: Insufficient documentation

## 2012-11-25 DIAGNOSIS — Z4889 Encounter for other specified surgical aftercare: Secondary | ICD-10-CM | POA: Insufficient documentation

## 2012-11-25 DIAGNOSIS — Z79899 Other long term (current) drug therapy: Secondary | ICD-10-CM | POA: Insufficient documentation

## 2012-11-25 DIAGNOSIS — N186 End stage renal disease: Secondary | ICD-10-CM | POA: Insufficient documentation

## 2012-11-25 DIAGNOSIS — Z950 Presence of cardiac pacemaker: Secondary | ICD-10-CM | POA: Insufficient documentation

## 2012-11-25 DIAGNOSIS — I120 Hypertensive chronic kidney disease with stage 5 chronic kidney disease or end stage renal disease: Secondary | ICD-10-CM | POA: Insufficient documentation

## 2012-11-25 DIAGNOSIS — Z992 Dependence on renal dialysis: Secondary | ICD-10-CM | POA: Insufficient documentation

## 2012-11-25 DIAGNOSIS — Z7901 Long term (current) use of anticoagulants: Secondary | ICD-10-CM | POA: Insufficient documentation

## 2012-11-25 DIAGNOSIS — E119 Type 2 diabetes mellitus without complications: Secondary | ICD-10-CM | POA: Insufficient documentation

## 2012-11-25 NOTE — ED Notes (Signed)
[facility] notified that pt would not be making his appointment this am.

## 2012-11-25 NOTE — ED Provider Notes (Signed)
Medical screening examination/treatment/procedure(s) were performed by non-physician practitioner and as supervising physician I was immediately available for consultation/collaboration.    Olivia Mackie, MD 11/25/12 [phone]

## 2012-11-25 NOTE — ED Notes (Signed)
Per patient states a couple of months ago he had fluid on foot and created split (wound) causing him to have 15 day hospital stay. Pt states a couple of nights ago scab came off in shoe and ems came out to home and bandaged food. Early this am wound starting bleeding again. Pt is currently on 2.5 mg Warfarin. Has [facility] for Dialysis on Tues,Thurs, and Sat. Restricted arm band on Right arm. Pt has hx of DM w/ CBG 71

## 2012-11-25 NOTE — ED Provider Notes (Signed)
CSN: [id number]     Arrival date & time 11/25/12  4098 History   None    Chief Complaint  Patient presents with  . Open Wound    Left foot wound    (Consider location/radiation/quality/duration/timing/severity/associated sxs/prior Treatment) HPI  Adam [name] is a 47 y.o. male past medical history significant for insulin-dependent diabetes, atrial fibrillation on chronic warfarin, ESRD dialyzed Tuesday Thursday and Saturday coming in complaining of active bleeding from poorly healing wound to dorsum of left foot. Patient states he's had this one for over 2 months, has a home health nurse that changed the dressing every few days. States that a scab came off several days ago and EMS was called to evaluate, they dressed the wound and did not transport. Patient woke up this morning and saw a scant amount of bright red blood shooting from a small wound to the lateral side of the foot. Patient denies chest pain, shortness of breath, palpitations, dizziness, fever, redness or increasing swelling or discharge from the wound. Patient had his INR checked on x3days ago. They reduced his warfarin 2.5 mg daily and he hasn't recheck scheduled in mid November.   Past Medical History  Diagnosis Date  . Other and unspecified hyperlipidemia   . Insomnia, unspecified   . Obstructive sleep apnea (adult) (pediatric)   . Allergic rhinitis, cause unspecified   . Nonischemic cardiomyopathy     s/p St. Jude ICD  . Unspecified essential hypertension   . Type II or unspecified type diabetes mellitus without mention of complication, not stated as uncontrolled   . CKD (chronic kidney disease) stage 3, GFR 30-59 ml/min   . Esophageal reflux   . Morbid obesity   . Dysuria   . Tobacco use disorder   . Dysrhythmia   . Pacemaker   . Antral ulcer   . Renal azotemia   . Arthritis     Gout w/hyperuricemia  . Morbid obesity    Past Surgical History  Procedure Laterality Date  . Cardiac defibrillator placement   2011    St. Jude  . Esophagogastroduodenoscopy  01/03/2011    Procedure: ESOPHAGOGASTRODUODENOSCOPY (EGD);  Surgeon: Vertell Novak., MD;  Location: [facility] ENDOSCOPY;  Service: Endoscopy;  Laterality: N/A;  . Av fistula placement Right 08/31/2012    Procedure: ARTERIOVENOUS (AV) FISTULA CREATION;  Surgeon: Chuck Hint, MD;  Location: [facility] OR;  Service: Vascular;  Laterality: Right;  . Insertion of dialysis catheter Right 08/31/2012    Procedure: INSERTION OF DIALYSIS CATHETER-Right Internal Jugular Placement;  Surgeon: Chuck Hint, MD;  Location: [facility] OR;  Service: Vascular;  Laterality: Right;   Family History  Problem Relation Age of Onset  . Diabetes Mother   . Microcephaly Father   . Lung cancer Father    History  Substance Use Topics  . Smoking status: Current Every Day Smoker -- 0.25 packs/day    Types: Cigarettes  . Smokeless tobacco: Never Used     Comment:  pt states that he will quit on his own and is in the process of quiting  . Alcohol Use: No     Comment: No alcohol x 1 month.    Review of Systems 10 systems reviewed and found to be negative, except as noted in the HPI   Allergies  Argatroban; Ace inhibitors; and Heparin  Home Medications   Current Outpatient Rx  Name  Route  Sig  Dispense  Refill  . acetaminophen (TYLENOL) 325 MG tablet   Oral  Take 650 mg by mouth 2 (two) times daily.         Marland Kitchen amiodarone (PACERONE) 200 MG tablet   Oral   Take 200 mg by mouth daily.         . colchicine 0.6 MG tablet   Oral   Take 0.6 mg by mouth See admin instructions. Takes every three days.         . diclofenac sodium (VOLTAREN) 1 % GEL   Topical   Apply 2 g topically 4 (four) times daily.          . Febuxostat (ULORIC) 80 MG TABS   Oral   Take 1 tablet by mouth daily.         . fluticasone (FLONASE) 50 MCG/ACT nasal spray   Nasal   Place 1 spray into the nose daily as needed for allergies. For allergies         . insulin glargine  (LANTUS) 100 UNIT/ML injection   Subcutaneous   Inject 45 Units into the skin at bedtime.         Marland Kitchen loratadine (CLARITIN) 10 MG tablet   Oral   Take 1 tablet (10 mg total) by mouth as needed.   90 tablet   1   . LORazepam (ATIVAN) 0.5 MG tablet      TAKE 1 TABLET TWICE A DAY AS NEEDED   60 tablet   1   . metoprolol succinate (TOPROL-XL) 25 MG 24 hr tablet   Oral   Take 12.5 mg by mouth daily.          Marland Kitchen NEXIUM 24HR 20 MG capsule      TAKE ONE CAPSULE EVERY DAY BEFORE BREAKFAST   30 capsule   9   . traMADol (ULTRAM) 50 MG tablet      TAKE 1 TABLET BY MOUTH EVERY 6 HOURS AS NEEDED FOR PAIN   60 tablet   0     PLEASE PROVIDE YOUR CURRENT DEA INFORMATION.   Marland Kitchen warfarin (COUMADIN) 2.5 MG tablet   Oral   Take 2.5 mg by mouth daily.          BP 97/63  Pulse 83  Resp 18  SpO2 98% Physical Exam  Nursing note and vitals reviewed. Constitutional: He is oriented to person, place, and time. He appears well-developed and well-nourished. No distress.  HENT:  Head: Normocephalic.  Mouth/Throat: Oropharynx is clear and moist.  Mild conjunctival pallor  Eyes: Conjunctivae and EOM are normal. Pupils are equal, round, and reactive to light.  Neck: Normal range of motion.  Cardiovascular: Normal rate, regular rhythm and intact distal pulses.   Pulmonary/Chest: Effort normal and breath sounds normal. No stridor. No respiratory distress. He has no wheezes. He has no rales. He exhibits no tenderness.  Abdominal: Soft. Bowel sounds are normal.  Musculoskeletal: Normal range of motion. He exhibits edema.  2+ pitting edema bilaterally. Patient has requested to the bilateral lower extremities, on the lateral side of the dorsum of the left foot there is a well-healing, 2 x 4 cm wound, just proximal to this there is a scabbed wound that measures approximately half a centimeter in diameter and there is no active bleeding. Neurovascularly intact.  Neurological: He is alert and oriented  to person, place, and time.  Psychiatric: He has a normal mood and affect.    ED Course  Procedures (including critical care time) Labs Review Labs Reviewed - No data to display Imaging Review No results found.  EKG Interpretation   None       MDM   1. Open wound of foot, left, subsequent encounter    Filed Vitals:   11/25/12 0500 11/25/12 0530 11/25/12 0600 11/25/12 0645  BP: 97/54 97/63 102/64 104/61  Pulse: 90 83 82 84  Resp:      SpO2: 97% 98% 97% 99%     Adam [name] is a 47 y.o. male insulin-dependent diabetic, chronically anticoagulated for A. fib, ESRD on dialysis presenting with active bleeding 2 poorly healing wound to the left foot. On my right UA chin bleeding is well controlled. Patient does not have any signs of acute blood loss or anemia. I dressed the wound and instructed the patient how to perform pressure dressing. Return precautions were discussed. He will be discharged to go directly to dialysis. Patient will reschedule his home health nurse for tomorrow. Blood pressure is relatively soft with a systolic of 97, however this is not out of line for his baseline.  Pt is hemodynamically stable, appropriate for, and amenable to discharge at this time. Pt verbalized understanding and agrees with care plan. All questions answered. Outpatient follow-up and specific return precautions discussed.    Note: Portions of this report may have been transcribed using voice recognition software. Every effort was made to ensure accuracy; however, inadvertent computerized transcription errors may be present      Wynetta Emery, PA-C 11/25/12 [phone]

## 2012-11-25 NOTE — Telephone Encounter (Signed)
Pt calls and states he was in ED this am for foot wound and needs to know about taking his coumadin, does he need today f/u in clinic? Does he need to decrease coumadin? His ph# is [phone]

## 2012-11-25 NOTE — Telephone Encounter (Signed)
Dr. Alexandria Lodge takes care of the coumadin management for this patient. Sent to him.

## 2012-11-26 NOTE — Telephone Encounter (Signed)
i have paged dr groce yesterday and today with no call back, i will assume he is out of town and with that being said we need to give this pt an answer re: his coumadin. Please advise

## 2012-11-26 NOTE — Telephone Encounter (Signed)
I called [name] and he stated that Dr. Alexandria Lodge called him yesterday and told him to hold coumadin for 1 dose.  He has had no more bleeding from the wound and will be restarting the coumadin back today.  He knows to return to the hospital/clinic should it restart bleeding.

## 2012-11-29 ENCOUNTER — Ambulatory Visit (INDEPENDENT_AMBULATORY_CARE_PROVIDER_SITE_OTHER): Payer: Medicaid Other | Admitting: *Deleted

## 2012-11-29 DIAGNOSIS — Z95810 Presence of automatic (implantable) cardiac defibrillator: Secondary | ICD-10-CM

## 2012-11-29 DIAGNOSIS — I428 Other cardiomyopathies: Secondary | ICD-10-CM

## 2012-11-29 LAB — ICD DEVICE OBSERVATION
BRDY-0002RV: 40 {beats}/min
DEV-0020ICD: NEGATIVE
DEVICE MODEL ICD: 708237
HV IMPEDENCE: 43 Ohm
RV LEAD IMPEDENCE ICD: 412.5 Ohm
RV LEAD THRESHOLD: 0.75 V
TOT-0007: 1
TOT-0009: 0
TOT-0010: 14
VENTRICULAR PACING ICD: 0 pct
VF: 0

## 2012-11-29 NOTE — Progress Notes (Signed)
Device check in clinic, all functions normal, no changes made, full details in [facility].  ROV w/ Dr. Ladona Ridgel 03/02/13

## 2012-12-05 ENCOUNTER — Other Ambulatory Visit: Payer: Self-pay | Admitting: Internal Medicine

## 2012-12-08 ENCOUNTER — Encounter: Payer: Self-pay | Admitting: Internal Medicine

## 2012-12-10 NOTE — Telephone Encounter (Signed)
Called to pharm 

## 2012-12-13 ENCOUNTER — Ambulatory Visit (INDEPENDENT_AMBULATORY_CARE_PROVIDER_SITE_OTHER): Payer: Medicaid Other | Admitting: Pharmacist

## 2012-12-13 ENCOUNTER — Telehealth: Payer: Self-pay | Admitting: *Deleted

## 2012-12-13 ENCOUNTER — Ambulatory Visit: Payer: Medicaid Other

## 2012-12-13 ENCOUNTER — Ambulatory Visit (INDEPENDENT_AMBULATORY_CARE_PROVIDER_SITE_OTHER): Payer: Medicaid Other | Admitting: Internal Medicine

## 2012-12-13 ENCOUNTER — Ambulatory Visit: Payer: Medicaid Other | Admitting: Internal Medicine

## 2012-12-13 ENCOUNTER — Encounter: Payer: Self-pay | Admitting: Internal Medicine

## 2012-12-13 VITALS — BP 102/70 | HR 105 | Temp 97.0°F | Ht 73.0 in | Wt 316.0 lb

## 2012-12-13 DIAGNOSIS — Z7901 Long term (current) use of anticoagulants: Secondary | ICD-10-CM

## 2012-12-13 DIAGNOSIS — M25569 Pain in unspecified knee: Secondary | ICD-10-CM

## 2012-12-13 DIAGNOSIS — S91302A Unspecified open wound, left foot, initial encounter: Secondary | ICD-10-CM

## 2012-12-13 DIAGNOSIS — X58XXXA Exposure to other specified factors, initial encounter: Secondary | ICD-10-CM

## 2012-12-13 DIAGNOSIS — I4891 Unspecified atrial fibrillation: Secondary | ICD-10-CM

## 2012-12-13 DIAGNOSIS — S91309A Unspecified open wound, unspecified foot, initial encounter: Secondary | ICD-10-CM

## 2012-12-13 DIAGNOSIS — E1129 Type 2 diabetes mellitus with other diabetic kidney complication: Secondary | ICD-10-CM

## 2012-12-13 DIAGNOSIS — M25562 Pain in left knee: Secondary | ICD-10-CM

## 2012-12-13 DIAGNOSIS — I10 Essential (primary) hypertension: Secondary | ICD-10-CM

## 2012-12-13 LAB — POCT INR: INR: 2.9

## 2012-12-13 LAB — GLUCOSE, CAPILLARY: Glucose-Capillary: 119 mg/dL — ABNORMAL HIGH (ref 70–99)

## 2012-12-13 MED ORDER — VASELINE PETROLATUM GAUZE EX PADS: 1.0000 | MEDICATED_PAD | Freq: Two times a day (BID) | CUTANEOUS | Status: DC

## 2012-12-13 NOTE — Telephone Encounter (Signed)
Pt's HHN is Elease Hashimoto @ [phone].  Message left for her to call clinic about wound care orders.

## 2012-12-13 NOTE — Patient Instructions (Signed)
Thank you for your visit today.  Today we did a foot exam and you had your eyes scanned by our retinal scanner.  You should continue to hold your Toprol and should only restart this if you nephrologist tells you to when you go to your dialysis session.  Your A1c today was 4.9%.   Please get your flu shot at dialysis as we discussed.

## 2012-12-13 NOTE — Assessment & Plan Note (Signed)
Blood pressure 102/70 today. Continue to hold Toprol 12.5 mg. Will restart as per dialysis.

## 2012-12-13 NOTE — Telephone Encounter (Signed)
Nurse called stating dressing has been staying wet with Calcium Alginate dressings.  This is done three times a day.  Past 2 days she tried vasoline dsg and noted improvement.  Pt has not been seen by  wound care.

## 2012-12-13 NOTE — Assessment & Plan Note (Signed)
Erosion to patient's left dorsal foot appears to be healing well with no evidence of infection. He has a home health nurse that comes to change his dressings multiple times per week. I prescribed Vaseline-soaked gauze as per patient's request for the dressing of this wound.

## 2012-12-13 NOTE — Progress Notes (Signed)
Anti-Coagulation Progress Note  Adam [name] is a 47 y.o. male who is currently on an anti-coagulation regimen.    RECENT RESULTS: Recent results are below, the most recent result is correlated with a dose of 30 mg. per week: Lab Results  Component Value Date   INR 2.90 12/13/2012   INR 4.20 11/22/2012   INR 6.90 11/15/2012    ANTI-COAG DOSE: Anticoagulation Dose Instructions as of 12/13/2012     [name] Tue Wed Thu Fri Sat   New Dose 3.75 mg 3.75 mg 3.75 mg 5 mg 3.75 mg 3.75 mg 3.75 mg       ANTICOAG SUMMARY: Anticoagulation Episode Summary   Current INR goal 2.0-3.0  Next INR check 01/10/2013  INR from last check 2.90 (12/13/2012)  Weekly max dose   Target end date   INR check location   Preferred lab   Send INR reminders to ANTICOAG IMP   Indications  Atrial fibrillation [427.31] Long term (current) use of anticoagulants [V58.61]        Comments amiodarone started 8/28        ANTICOAG TODAY: Anticoagulation Summary as of 12/13/2012   INR goal 2.0-3.0  Selected INR 2.90 (12/13/2012)  Next INR check 01/10/2013  Target end date    Indications  Atrial fibrillation [427.31] Long term (current) use of anticoagulants [V58.61]      Anticoagulation Episode Summary   INR check location    Preferred lab    Send INR reminders to ANTICOAG IMP   Comments amiodarone started 8/28      PATIENT INSTRUCTIONS: Patient Instructions  Patient instructed to take medications as defined in the Anti-coagulation Track section of this encounter.  Patient instructed to take today's dose.  Patient verbalized understanding of these instructions.       FOLLOW-UP Return in 4 weeks (on 01/10/2013) for Follow up INR at 0930h.  Hulen Luster, III Pharm.D., CACP

## 2012-12-13 NOTE — Patient Instructions (Signed)
Patient instructed to take medications as defined in the Anti-coagulation Track section of this encounter.  Patient instructed to take today's dose.  Patient verbalized understanding of these instructions.    

## 2012-12-13 NOTE — Assessment & Plan Note (Signed)
Patient will see sports medicine in 2 days. I defer management to them.

## 2012-12-13 NOTE — Telephone Encounter (Signed)
Pt came into clinic to see Dr Alexandria Lodge today and is asking for an order to be sent o [facility] for vasoline dressings to foot. Pt seen in ED on 10/30 with c/o bleeding for left heel. He was treated and pressure dressing applied.  Pt was to be seen by his HHN the next day. [facility]  I called [facility] and talked with Marcelino Duster RN but she has not seen pt. She will find out Name and # of nurse seeing pt and call me back.

## 2012-12-13 NOTE — Assessment & Plan Note (Signed)
Patient has had symptoms of hypoglycemia in the recent past, however since his Lantus dose was decreased to 45 units each bedtime hypoglycemia does not appear to have been a problem for him. I discussed with him the symptoms of hypoglycemia and what to look out for. I encouraged him to continue to checking his blood sugar more frequently, preferably 3 times a day before meals. I also encouraged him to check his blood sugar when he does experience symptoms of hypoglycemia. -patient's glucometer was calibrated today -continue lantus 45U qHS  -deferred flu shot today (reports he will get this at HD) -retinal scan performed today in the clinic -foot exam done today -A1c checked today

## 2012-12-13 NOTE — Telephone Encounter (Signed)
Pt seen in clinic and order written for vasoline gauze dressings. HHN informed and will call us for any changes.

## 2012-12-13 NOTE — Progress Notes (Signed)
Patient ID: Adam [name], male   DOB: 1965/02/05, 47 y.o.   MRN: [id number] HPI The patient is a 47 y.o. male with a history of ESRD on HD (TTS), CHF with ejection fraction 25% (AICD), obstructive sleep apnea, hypertension, gout, and type 2 diabetes presenting today for routine clinic visit.  HTN- Patient reports that his kidney doctor discontinued his Toprol 12.5 mg daily at his last HD session on Thursday 2/2 low blood pressures w/ HD. A blood pressure is 102/70. He reports that his kidney doctor stated they would reassess this at his next visit and restart the medication if his blood pressure tolerated.  DM type 2, controlled- A1c today is 4.9%. Patient takes Lantus 45 units each bedtime. Patient was seen in September 2014 at which time he was describing having multiple episodes of tremulousness and symptoms of hypoglycemia. Prior to that visit, he had been on Lantus 65 units 2 at bedtime, but had decreased this dose to 50 units each bedtime himself given he thought his sugars were too low. At his visit in September, Dr. Yetta Barre decreased the dose of Lantus further to 45 units each bedtime which is what he is taking now. Patient is not describing having symptoms of hypoglycemia since his dose was decreased to 45 units. His blood glucose readings today show sugars in the 90s-130s. Nurse recalibrated glucometer today to make sure these readings are accurate from now on.  Left foot wound- Patient has history of open sore to his left dorsal foot. He has a home health nurse (not wound care nurse) that comes to his house multiple times a week to help him change the dressing. Per patient, the wound is healing well. Patient requesting Vaseline soaked gauze for his nurse to use on his foot.  OA L knee-  Patient reports having left knee pain secondary to osteoarthritis. He see sports medicine for this, next appointment this Wednesday at 3:30 PM. He has been using tramadol, which helps the pain. He has had a injection  of corticosteroids in the past, which helped significantly. He believes he would benefit from another corticosteroid shot in his knee at his next appointment on Wednesday.  ROS: General: no fevers, chills, changes in weight, changes in appetite Skin: no rash HEENT: no blurry vision, hearing changes, sore throat Pulm: no dyspnea, coughing, wheezing CV: no chest pain, palpitations, shortness of breath Abd: no abdominal pain, nausea/vomiting, diarrhea/constipation GU: no dysuria, hematuria, polyuria Ext: see HPI Neuro: no weakness, numbness, or tingling  Filed Vitals:   12/13/12 0948  BP: 102/70  Pulse: 105  Temp: 97 F (36.1 C)   Physical Exam General: alert, cooperative, and in no apparent distress HEENT: pupils equal round and reactive to light, vision grossly intact, oropharynx clear and non-erythematous  Neck: supple, no lymphadenopathy Lungs: clear to ascultation bilaterally, normal work of respiration, no wheezes, rales, ronchi Heart: regular rate and rhythm, no murmurs, gallops, or rubs Abdomen: obese, soft, non-tender, non-distended, normal bowel sounds Extremities: 1+ pitting edema bilaterally, warm extremities; multiple small areas of erosion to L dorsal foot- no drainage, erythema, or induration, appears to be healing well; unable to palpate DP/PT pulses 2/2 edema, though BLE are warm Neurologic: alert & oriented X3, cranial nerves II-XII grossly intact, strength grossly intact, sensation intact to light touch  Current Outpatient Prescriptions on File Prior to Visit  Medication Sig Dispense Refill  . acetaminophen (TYLENOL) 325 MG tablet Take 650 mg by mouth 2 (two) times daily.      Marland Kitchen  amiodarone (PACERONE) 200 MG tablet Take 200 mg by mouth daily.      . colchicine 0.6 MG tablet Take 0.6 mg by mouth See admin instructions. Takes every three days.      . diclofenac sodium (VOLTAREN) 1 % GEL Apply 2 g topically 4 (four) times daily.       . Febuxostat (ULORIC) 80 MG TABS  Take 1 tablet by mouth daily.      . fluticasone (FLONASE) 50 MCG/ACT nasal spray Place 1 spray into the nose daily as needed for allergies. For allergies      . insulin glargine (LANTUS) 100 UNIT/ML injection Inject 45 Units into the skin at bedtime.      Marland Kitchen loratadine (CLARITIN) 10 MG tablet Take 1 tablet (10 mg total) by mouth as needed.  90 tablet  1  . LORazepam (ATIVAN) 0.5 MG tablet TAKE 1 TABLET TWICE A DAY AS NEEDED  60 tablet  1  . metoprolol succinate (TOPROL-XL) 25 MG 24 hr tablet Take 12.5 mg by mouth daily.       Marland Kitchen NEXIUM 24HR 20 MG capsule TAKE ONE CAPSULE EVERY DAY BEFORE BREAKFAST  30 capsule  9  . traMADol (ULTRAM) 50 MG tablet TAKE 1-2 TABLETS EVERY 6 HOURS AS NEEDED FOR PAIN.  60 tablet  0  . warfarin (COUMADIN) 2.5 MG tablet Take 2.5 mg by mouth daily.       No current facility-administered medications on file prior to visit.    Assessment/Plan

## 2012-12-14 ENCOUNTER — Encounter: Payer: Medicaid Other | Admitting: Internal Medicine

## 2012-12-15 ENCOUNTER — Encounter: Payer: Self-pay | Admitting: Emergency Medicine

## 2012-12-15 ENCOUNTER — Ambulatory Visit (INDEPENDENT_AMBULATORY_CARE_PROVIDER_SITE_OTHER): Payer: Medicaid Other | Admitting: Emergency Medicine

## 2012-12-15 VITALS — BP 115/79 | Ht 73.0 in | Wt 316.0 lb

## 2012-12-15 DIAGNOSIS — M25569 Pain in unspecified knee: Secondary | ICD-10-CM

## 2012-12-15 DIAGNOSIS — IMO0002 Reserved for concepts with insufficient information to code with codable children: Secondary | ICD-10-CM

## 2012-12-15 DIAGNOSIS — M7051 Other bursitis of knee, right knee: Secondary | ICD-10-CM

## 2012-12-15 MED ORDER — ACETAMINOPHEN-CODEINE #3 300-30 MG PO TABS: 1.0000 | ORAL_TABLET | Freq: Four times a day (QID) | ORAL | Status: DC | PRN

## 2012-12-15 MED ORDER — DICLOFENAC SODIUM 1 % TD GEL: 2.0000 g | Freq: Four times a day (QID) | TRANSDERMAL | Status: DC

## 2012-12-15 NOTE — Assessment & Plan Note (Signed)
Examination consistent with possible presence 3 bursitis. 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  We have given him a prescription for Tylenol No. 3 as well as dalteparin gel to apply topically to the area. He will followup as needed. We've encouraged her to followup with his primary care doctor as well.

## 2012-12-15 NOTE — Progress Notes (Signed)
Patient ID: Adam [name], male   DOB: 04/24/1965, 47 y.o.   MRN: [id number] 47 year old male with complaint of medial left knee pain. Localizes the pain to the medial aspect radiating down to the anteromedial portion of his tibia. Tender to direct pressure and touch. Has similar symptoms on the right in the same location however much milder. It is onset of symptoms of the past one to 2 weeks.  He also notes some swelling of his legs bilaterally the left worse than the right. Denies any pain in his calf. Currently being treated for a small wound in his foot which is improving. He's been ambulating with a postop shoe for the past several weeks.  Pertinent past medical history: Chronic kidney disease on dialysis, cardiomyopathy on Coumadin last INR 2.9 on Monday, diabetes  Social history positive for tobacco history.  Review of systems as per history of present illness otherwise negative  Examination: Well-developed well-nourished 47 year old male awake alert oriented no acute distress BP 115/79  Ht 6\' 1"  (1.854 m)  Wt 316 lb (143.337 kg)  BMI 41.70 kg/m2   Bilateral lower extremity edema 2+ to the knees  Bilateral Knees: Normal to inspection with no erythema or effusion or obvious bony abnormalities. Palpation normal with no warmth, joint line tenderness, patellar tenderness, or condyle tenderness. ROM full in flexion and extension and lower leg rotation. Ligaments with solid consistent endpoints including ACL, PCL, LCL, MCL. Negative Mcmurray's  tests. Non painful patellar compression. Patellar glide without crepitus. Patellar and quadriceps tendons unremarkable. Hamstring and quadriceps strength is normal.  Tenderness to palpation of the semimembranosus and semitendinosus tendons to the point of the insertion of the pelvis and strain bursa left greater than right  Negative Homans sign No calf tenderness

## 2012-12-22 ENCOUNTER — Other Ambulatory Visit: Payer: Self-pay | Admitting: Internal Medicine

## 2012-12-24 ENCOUNTER — Ambulatory Visit: Payer: Medicaid Other | Admitting: Internal Medicine

## 2012-12-27 ENCOUNTER — Telehealth: Payer: Self-pay | Admitting: *Deleted

## 2012-12-27 NOTE — Telephone Encounter (Signed)
Blood sugar is 50 when he wakes. Cut his lantus to 30 units last night, blood sugr reading was  E21- "too low" this morning. He drank juice and retested and it came up to 51. Blood pressure has been dropping at dialysis also. Blood sugar have not been higher than 85 after eating recently and mostly 80s before bed...  Eats a snack and feels he has a good appetite despite  weight loss- 299 now, used to be 365#.  Advised Mr. Markovitz to hold insulin tonight and monitoring blood sugars tomorrow and always carry a source of sugar with him at all times. He agreed to this plan and also to call tomorrow with results of blood sugars.

## 2012-12-27 NOTE — Telephone Encounter (Signed)
Adam [name], could you please call [name], he is having problems w/ low sugars in the mornings  And is decreasing his insulin himself and not having success, please call [phone]

## 2012-12-28 ENCOUNTER — Telehealth: Payer: Self-pay | Admitting: *Deleted

## 2012-12-28 NOTE — Telephone Encounter (Signed)
Adam [name] with [facility] called [phone] concerning pt - about low CBG and BP. Has problems with transportation and goes to dialysis Tues,Thurs and Sat. Toprol XL has been stopped by dialysis due to low BP and unable to complete full treatment due to BP. Appt made 12/31/12 10:15AM with Dr Sherrine Maples. [facility] nurse will tell pt about appt in clinic .[name] RN 12/28/12 9:30AM

## 2012-12-29 ENCOUNTER — Other Ambulatory Visit: Payer: Self-pay | Admitting: Internal Medicine

## 2012-12-30 ENCOUNTER — Encounter: Payer: Self-pay | Admitting: Internal Medicine

## 2012-12-30 ENCOUNTER — Other Ambulatory Visit: Payer: Self-pay | Admitting: *Deleted

## 2012-12-30 NOTE — Telephone Encounter (Addendum)
Spoke with Dr. Yetta Barre who approved holding insulin. Patient to be seen in office on 12/31/12.  Patient reports that his blood sugars have been good. With no insulin:   Tuesday 123  Wednesday 119  Today 118 He is having trouble with transportation since family tragedy, but will find a way to make it here tomorrow.

## 2012-12-30 NOTE — Addendum Note (Signed)
Addended by: Bufford Spikes on: 12/30/2012 10:05 AM   Modules accepted: Orders

## 2012-12-31 ENCOUNTER — Encounter: Payer: Self-pay | Admitting: Internal Medicine

## 2012-12-31 ENCOUNTER — Ambulatory Visit (INDEPENDENT_AMBULATORY_CARE_PROVIDER_SITE_OTHER): Payer: Medicaid Other | Admitting: Internal Medicine

## 2012-12-31 VITALS — BP 101/63 | HR 102 | Temp 97.1°F | Ht 73.0 in | Wt 318.0 lb

## 2012-12-31 DIAGNOSIS — I10 Essential (primary) hypertension: Secondary | ICD-10-CM

## 2012-12-31 DIAGNOSIS — N186 End stage renal disease: Secondary | ICD-10-CM

## 2012-12-31 DIAGNOSIS — E1129 Type 2 diabetes mellitus with other diabetic kidney complication: Secondary | ICD-10-CM

## 2012-12-31 DIAGNOSIS — F17200 Nicotine dependence, unspecified, uncomplicated: Secondary | ICD-10-CM

## 2012-12-31 DIAGNOSIS — I120 Hypertensive chronic kidney disease with stage 5 chronic kidney disease or end stage renal disease: Secondary | ICD-10-CM

## 2012-12-31 LAB — GLUCOSE, CAPILLARY: Glucose-Capillary: 141 mg/dL — ABNORMAL HIGH (ref 70–99)

## 2012-12-31 MED ORDER — FEBUXOSTAT 80 MG PO TABS: 1.0000 | ORAL_TABLET | Freq: Every day | ORAL | Status: AC

## 2012-12-31 NOTE — Progress Notes (Signed)
Patient ID: Adam [name], male   DOB: 1965/06/22, 47 y.o.   MRN: [id number]  Subjective:   Patient ID: Adam [name] male   DOB: [birth year] 47 y.o.   MRN: [id number]  HPI: Adam [name] is a 47 y.o. M w/ PMH  presents for an acute visit c/o low BP and low blood sugars.  Pt w/ ESRD and is on HD TTHSa. His BP has been running low in HD and his Toprol-XL was stopped at dialysis. He states that his dry weight has since been increased by the Nephrologist due to his hypotension.   He called the clinic on 12/27/12 due to hypoglycemia and was told to stop his insulin. He has not used his insulin since then. His CBG today in the clinic is 141. He did bring his meter today and his lowest CBG was at the end of November and was about 48. He has had other episodes of hypoglycemia into the 50s and 60s, all of which occurred around the end of November.    Past Medical History  Diagnosis Date  . Other and unspecified hyperlipidemia   . Insomnia, unspecified   . Obstructive sleep apnea (adult) (pediatric)   . Allergic rhinitis, cause unspecified   . Nonischemic cardiomyopathy     s/p St. Jude ICD  . Unspecified essential hypertension   . Type II or unspecified type diabetes mellitus without mention of complication, not stated as uncontrolled   . CKD (chronic kidney disease) stage 3, GFR 30-59 ml/min   . Esophageal reflux   . Morbid obesity   . Dysuria   . Tobacco use disorder   . Dysrhythmia   . Pacemaker   . Antral ulcer   . Renal azotemia   . Arthritis     Gout w/hyperuricemia  . Morbid obesity    Current Outpatient Prescriptions  Medication Sig Dispense Refill  . acetaminophen (TYLENOL) 325 MG tablet Take 650 mg by mouth 2 (two) times daily.      Marland Kitchen acetaminophen-codeine (TYLENOL #3) 300-30 MG per tablet Take 1-2 tablets by mouth every 6 (six) hours as needed for moderate pain.  20 tablet  0  . amiodarone (PACERONE) 200 MG tablet Take 200 mg by mouth daily.      . colchicine 0.6 MG tablet Take  0.6 mg by mouth See admin instructions. Takes every three days.      . diclofenac sodium (VOLTAREN) 1 % GEL Apply 2 g topically 4 (four) times daily.  1 Tube  2  . Febuxostat (ULORIC) 80 MG TABS Take 1 tablet (80 mg total) by mouth daily.  30 tablet  5  . fluticasone (FLONASE) 50 MCG/ACT nasal spray Place 1 spray into the nose daily as needed for allergies. For allergies      . insulin glargine (LANTUS) 100 UNIT/ML injection Inject 45 Units into the skin at bedtime.      Marland Kitchen loratadine (CLARITIN) 10 MG tablet Take 1 tablet (10 mg total) by mouth as needed.  90 tablet  1  . LORazepam (ATIVAN) 0.5 MG tablet TAKE 1 TABLET TWICE A DAY AS NEEDED  60 tablet  1  . metoprolol succinate (TOPROL-XL) 25 MG 24 hr tablet Take 12.5 mg by mouth daily.       Marland Kitchen NEXIUM 24HR 20 MG capsule TAKE ONE CAPSULE EVERY DAY BEFORE BREAKFAST  30 capsule  9  . traMADol (ULTRAM) 50 MG tablet TAKE 1 TABLET BY MOUTH EVERY 6 HOURS AS NEEDED FOR PAIN  60 tablet  2  . warfarin (COUMADIN) 2.5 MG tablet Take 2.5 mg by mouth daily.      . Wound Dressings (VASELINE PETROLATUM GAUZE) PADS Apply 1 each topically 2 (two) times daily.  12 each  2   No current facility-administered medications for this visit.   Family History  Problem Relation Age of Onset  . Diabetes Mother   . Microcephaly Father   . Lung cancer Father    History   Social History  . Marital Status: Single    Spouse Name: N/A    Number of Children: N/A  . Years of Education: N/A   Social History Main Topics  . Smoking status: Current Every Day Smoker -- 0.25 packs/day    Types: Cigarettes  . Smokeless tobacco: Never Used     Comment:  pt states that he will quit on his own and is in the process of quiting  . Alcohol Use: No     Comment: No alcohol x 1 month.  . Drug Use: No  . Sexual Activity: None   Other Topics Concern  . None   Social History Narrative  . None   Review of Systems: Constitutional: Denies fever, chills, appetite change or fatigue.   Respiratory: Denies SOB, DOE Cardiovascular: Denies chest pain Gastrointestinal: Denies nausea, vomiting, abdominal pain Genitourinary: Denies dysuria, urgency, frequency Musculoskeletal: +bilateral thigh pain Skin: Denies rash or wound.  Neurological: +light-headedness with hypoglycemia  Psychiatric/Behavioral: Denies suicidal ideation, mood changes  Objective:  Physical Exam: Filed Vitals:   12/31/12 1033  BP: 101/63  Pulse: 102  Temp: 97.1 F (36.2 C)  TempSrc: Oral  Height: 6\' 1"  (1.854 m)  Weight: 318 lb (144.244 kg)  SpO2: 95%   Constitutional: Vital signs reviewed.  Patient is a well-developed and well-nourished male in no acute distress and cooperative with exam.  Head: Normocephalic and atraumatic Eyes: PERRL, EOMI, conjunctivae normal, No scleral icterus.  Cardiovascular: RRR, no MRG, pulses symmetric and intact bilaterally Pulmonary/Chest: Normal respiratory effort, CTAB but breath sounds diminished, no wheezes, rales, or rhonchi Abdominal: Soft. Non-tender, non-distended, bowel sounds are normal Musculoskeletal: Moves all 4 extremities  Neurological: A&O x3, non focal Skin: Warm, dry and intact.  Psychiatric: Normal mood and affect. speech and behavior is normal.   Assessment & Plan:   Please refer to Problem List based Assessment and Plan

## 2012-12-31 NOTE — Assessment & Plan Note (Signed)
  Assessment: Progress toward smoking cessation:  smoking less Barriers to progress toward smoking cessation:    Comments: pt states that he smokes about 6 cig/day  Plan: Instruction/counseling given:  I counseled patient on the dangers of tobacco use, advised patient to stop smoking, and reviewed strategies to maximize success.  Patient agreed to the following self-care plans for smoking cessation: go to the [employer] ([email or web address])

## 2012-12-31 NOTE — Patient Instructions (Signed)
Continue to hold your Toprol XL until you see me back in clinic in 2 weeks.  Continue to hold your insulin until you see me back in clinic in 2 weeks. Check her sugars 2 times a day once in the morning and once at night before meals. Please bring her meter with you at her next visit so we can see her blood sugars are doing. Try to avoid regular sodas and juices as these will increase her blood sugars.  I will see back in 2 weeks in the clinic, but if your blood sugars continue to be low, or if they become high (greater than 200), please call the clinic as you may need to be seen sooner.

## 2012-12-31 NOTE — Assessment & Plan Note (Signed)
BP Readings from Last 3 Encounters:  12/31/12 101/63  12/15/12 115/79  12/13/12 102/70    Lab Results  Component Value Date   NA 139 10/22/2012   K 4.3 10/22/2012   CREATININE 4.49* 10/22/2012    Assessment: Blood pressure control:  (Pt with some hypotension) Progress toward BP goal:    Comments: Holding Toprol-XL x5 days and his dry weight has been increased for HD.  Plan: Medications:  Continue to hold Toprol-XL Educational resources provided:   Self management tools provided:   Other plans: F/u in 2 weeks

## 2012-12-31 NOTE — Assessment & Plan Note (Signed)
  Lab Results  Component Value Date   HGBA1C 4.9 12/13/2012   HGBA1C 9.1 07/27/2012   HGBA1C 8.6 04/15/2012     Assessment: Diabetes control: good control (HgbA1C at goal) Progress toward A1C goal:  at goal (now with hypoglycemia)  Plan: Medications:  Stop taking the Lantus Home glucose monitoring: Frequency: once a day Timing: before breakfast Instruction/counseling given: reminded to bring blood glucose meter & log to each visit and reminded to bring medications to each visit Educational resources provided:   Self management tools provided: copy of home glucose meter download  With his hypoglycemia he has stopped his Lantus. He has been off the insulin since 12/1 and since that time his CBGS have been around the 120s but up to the 140s. He states that he is still drinking regular sodas and juice. I encouraged him to avoid these and drink diet sodas if he felt he needed one. He is to continue to hold the Lantus and check his blood sugar 2x day before breakfast and dinner or before bedtime. He is to f/u in the clinic in 2 weeks and bring his meter with him. If he is to improve his diet, he may be able to come off of the insulin completely.

## 2013-01-01 ENCOUNTER — Other Ambulatory Visit (HOSPITAL_COMMUNITY): Payer: Self-pay | Admitting: Internal Medicine

## 2013-01-10 ENCOUNTER — Ambulatory Visit (INDEPENDENT_AMBULATORY_CARE_PROVIDER_SITE_OTHER): Payer: Medicaid Other | Admitting: Pharmacist

## 2013-01-10 ENCOUNTER — Ambulatory Visit (INDEPENDENT_AMBULATORY_CARE_PROVIDER_SITE_OTHER): Payer: Medicaid Other | Admitting: Internal Medicine

## 2013-01-10 ENCOUNTER — Encounter: Payer: Self-pay | Admitting: Internal Medicine

## 2013-01-10 ENCOUNTER — Other Ambulatory Visit: Payer: Self-pay | Admitting: Internal Medicine

## 2013-01-10 VITALS — BP 107/68 | HR 98 | Temp 97.1°F | Ht 73.0 in | Wt 323.3 lb

## 2013-01-10 DIAGNOSIS — M7051 Other bursitis of knee, right knee: Secondary | ICD-10-CM

## 2013-01-10 DIAGNOSIS — I4891 Unspecified atrial fibrillation: Secondary | ICD-10-CM

## 2013-01-10 DIAGNOSIS — Z7901 Long term (current) use of anticoagulants: Secondary | ICD-10-CM

## 2013-01-10 DIAGNOSIS — N186 End stage renal disease: Secondary | ICD-10-CM

## 2013-01-10 DIAGNOSIS — I5022 Chronic systolic (congestive) heart failure: Secondary | ICD-10-CM

## 2013-01-10 DIAGNOSIS — F411 Generalized anxiety disorder: Secondary | ICD-10-CM

## 2013-01-10 DIAGNOSIS — I959 Hypotension, unspecified: Secondary | ICD-10-CM

## 2013-01-10 LAB — POCT INR: INR: 3.6

## 2013-01-10 MED ORDER — LORATADINE 10 MG PO TABS: 10.0000 mg | ORAL_TABLET | ORAL | Status: DC | PRN

## 2013-01-10 MED ORDER — ESOMEPRAZOLE MAGNESIUM 20 MG PO CPDR: DELAYED_RELEASE_CAPSULE | ORAL | Status: DC

## 2013-01-10 MED ORDER — COLCHICINE 0.6 MG PO TABS: 0.6000 mg | ORAL_TABLET | ORAL | Status: AC

## 2013-01-10 MED ORDER — DICLOFENAC SODIUM 1 % TD GEL: 2.0000 g | Freq: Four times a day (QID) | TRANSDERMAL | Status: AC

## 2013-01-10 MED ORDER — LORAZEPAM 0.5 MG PO TABS: ORAL_TABLET | ORAL | Status: DC

## 2013-01-10 NOTE — Assessment & Plan Note (Signed)
Dialysis Tu/Th/Sa and no problems. Flu shot tomorrow at dialysis. Advised to elevate legs for volume control in lower extremity. Recent dry weight increase due to hypotension is likely causing some of this volume excess in his legs.

## 2013-01-10 NOTE — Assessment & Plan Note (Signed)
Refilled ativan 0.5 mg BID prn.

## 2013-01-10 NOTE — Assessment & Plan Note (Signed)
BP normal today off medications and will continue to monitor.

## 2013-01-10 NOTE — Patient Instructions (Signed)
Patient instructed to take medications as defined in the Anti-coagulation Track section of this encounter.  Patient instructed to take today's dose.  Patient verbalized understanding of these instructions.    

## 2013-01-10 NOTE — Progress Notes (Signed)
Subjective:     Patient ID: Adam [name], male   DOB: 1965-05-20, 47 y.o.   MRN: [id number]  HPI The patient is a 47 YO man who is coming in for a 2 week follow up. He was last seen for his low BP and low sugars at home. He had stopped lantus and stopped toprol-xl and is doing better. He has not noticed any low sugars or high sugars (>300) at home. He has PMH of ESRD on T/Th/Sa dialysis, DM II, CHF systolic last EF 08% with ICD, GERD, gout, A fib (on coumadin). He states that after his dry weight was increased at dialysis he is having less problems with low BP but still some. He is not having problems with dizziness when he stands. He has not had to stop dialysis early. He will get flu shot at dialysis tomorrow.   Review of Systems  Constitutional: Positive for fatigue. Negative for fever, chills, diaphoresis, activity change, appetite change and unexpected weight change.  HENT: Positive for congestion, postnasal drip and rhinorrhea. Negative for dental problem, drooling, ear discharge, ear pain, facial swelling, hearing loss, mouth sores, nosebleeds, sinus pressure, sneezing, sore throat, tinnitus, trouble swallowing and voice change.   Respiratory: Positive for shortness of breath. Negative for cough, chest tightness and wheezing.        Some SOB with activity  Cardiovascular: Positive for leg swelling. Negative for chest pain and palpitations.  Gastrointestinal: Negative for nausea, vomiting, abdominal pain, diarrhea and constipation.  Musculoskeletal: Positive for arthralgias and myalgias.  Neurological: Positive for weakness. Negative for dizziness, tremors, seizures, syncope, facial asymmetry, speech difficulty, light-headedness, numbness and headaches.       Objective:   Physical Exam  Constitutional: He is oriented to person, place, and time. He appears well-developed and well-nourished. No distress.  Obese  HENT:  Head: Normocephalic and atraumatic.  Eyes: EOM are normal. Pupils are  equal, round, and reactive to light.  Neck: Normal range of motion. Neck supple. No JVD present. No tracheal deviation present. No thyromegaly present.  Cardiovascular: Normal rate and regular rhythm.   Sounds to be in sinus  Pulmonary/Chest: Effort normal and breath sounds normal. No respiratory distress. He has no wheezes. He has no rales. He exhibits no tenderness.  Musculoskeletal: He exhibits edema and tenderness.  Swollen legs with 2+ edema to the thighs  Lymphadenopathy:    He has no cervical adenopathy.  Neurological: He is alert and oriented to person, place, and time.  Skin: Skin is warm and dry.       Assessment/Plan:   1. Please see problem oriented charting.  2. Disposition - He will return in 4-6 weeks for recheck of his BP and sugars and general well being. If he has any problems he will call us sooner. If his sugars are >300 for 2-3 days in a row he will call for advice about restarting a partial dose of lantus. He will elevate his feet while at home. Advised he can increase his flonase to BID while he is having congestion and take claritin daily for 2-3 weeks.

## 2013-01-10 NOTE — Patient Instructions (Signed)
We have refilled your medicines and will see you back in about 4-6 weeks to check on your blood pressures and your sugars. If you have low sugars call our clinic. If you have high (>300) sugars for 2-3 days call our clinic for advice. Avoid drinking sodas if possible as your kidneys do not work and cannot get rid of the water.   Keep your feet elevated at home when you are sitting and resting to help get some of the fluid out of them.   If you feel as though you can't breathe, have an infection or worsening pain and swelling call our clinic to come back in sooner.   Our number is [phone].

## 2013-01-10 NOTE — Assessment & Plan Note (Signed)
Sounds to be in sinus today and continue amiodarone 200 daily and coumadin. Continue OFF toprol-xl.

## 2013-01-10 NOTE — Assessment & Plan Note (Signed)
No JVD on exam, HR 98. OFF toprol-xl for hypotension. Not with pulmonary volume excess on exam today. ICD in place.

## 2013-01-10 NOTE — Progress Notes (Signed)
Anti-Coagulation Progress Note  Adam [name] is a 47 y.o. male who is currently on an anti-coagulation regimen.    RECENT RESULTS: Recent results are below, the most recent result is correlated with a dose of 27.5 mg. per week: Lab Results  Component Value Date   INR 3.60 01/10/2013   INR 2.90 12/13/2012   INR 4.20 11/22/2012    ANTI-COAG DOSE: Anticoagulation Dose Instructions as of 01/10/2013     [name] Tue Wed Thu Fri Sat   New Dose 3.75 mg 2.5 mg 3.75 mg 3.75 mg 3.75 mg 3.75 mg 3.75 mg       ANTICOAG SUMMARY: Anticoagulation Episode Summary   Current INR goal 2.0-3.0  Next INR check 01/31/2013  INR from last check 3.60! (01/10/2013)  Weekly max dose   Target end date   INR check location   Preferred lab   Send INR reminders to ANTICOAG IMP   Indications  Atrial fibrillation [427.31] Long term (current) use of anticoagulants [V58.61]        Comments amiodarone started 8/28        ANTICOAG TODAY: Anticoagulation Summary as of 01/10/2013   INR goal 2.0-3.0  Selected INR 3.60! (01/10/2013)  Next INR check 01/31/2013  Target end date    Indications  Atrial fibrillation [427.31] Long term (current) use of anticoagulants [V58.61]      Anticoagulation Episode Summary   INR check location    Preferred lab    Send INR reminders to ANTICOAG IMP   Comments amiodarone started 8/28      PATIENT INSTRUCTIONS: Patient Instructions  Patient instructed to take medications as defined in the Anti-coagulation Track section of this encounter.  Patient instructed to take today's dose.  Patient verbalized understanding of these instructions.       FOLLOW-UP Return in 3 weeks (on 01/31/2013) for Follow up INR at 0900h.  Hulen Luster, III Pharm.D., CACP

## 2013-01-10 NOTE — Progress Notes (Signed)
Case discussed with Dr. Kollar at the time of the visit.  We reviewed the resident's history and exam and pertinent patient test results.  I agree with the assessment, diagnosis, and plan of care documented in the resident's note.     

## 2013-01-17 NOTE — Progress Notes (Signed)
Case discussed with Dr. Glenn soon after the resident saw the patient.  We reviewed the resident's history and exam and pertinent patient test results.  I agree with the assessment, diagnosis, and plan of care documented in the resident's note. 

## 2013-01-18 ENCOUNTER — Telehealth: Payer: Self-pay | Admitting: *Deleted

## 2013-01-18 NOTE — Telephone Encounter (Signed)
Pt called would like to get a Bariatric Walker with wheels in the front.  Angelina Ok, RN 01/18/2013 11:43 AM

## 2013-01-25 ENCOUNTER — Other Ambulatory Visit: Payer: Self-pay | Admitting: *Deleted

## 2013-01-28 MED ORDER — FLUTICASONE PROPIONATE 50 MCG/ACT NA SUSP: 1.0000 | Freq: Every day | NASAL | Status: AC | PRN

## 2013-01-30 ENCOUNTER — Emergency Department (HOSPITAL_COMMUNITY)
Admission: EM | Admit: 2013-01-30 | Discharge: 2013-01-30 | Disposition: A | Discharge status: Home / Self Care | Payer: Medicaid Other | Attending: Emergency Medicine | Admitting: Emergency Medicine

## 2013-01-30 DIAGNOSIS — Z7901 Long term (current) use of anticoagulants: Secondary | ICD-10-CM | POA: Insufficient documentation

## 2013-01-30 DIAGNOSIS — I129 Hypertensive chronic kidney disease with stage 1 through stage 4 chronic kidney disease, or unspecified chronic kidney disease: Secondary | ICD-10-CM | POA: Insufficient documentation

## 2013-01-30 DIAGNOSIS — F17200 Nicotine dependence, unspecified, uncomplicated: Secondary | ICD-10-CM | POA: Insufficient documentation

## 2013-01-30 DIAGNOSIS — N183 Chronic kidney disease, stage 3 unspecified: Secondary | ICD-10-CM | POA: Insufficient documentation

## 2013-01-30 DIAGNOSIS — M129 Arthropathy, unspecified: Secondary | ICD-10-CM | POA: Insufficient documentation

## 2013-01-30 DIAGNOSIS — Z992 Dependence on renal dialysis: Secondary | ICD-10-CM | POA: Insufficient documentation

## 2013-01-30 DIAGNOSIS — E785 Hyperlipidemia, unspecified: Secondary | ICD-10-CM | POA: Insufficient documentation

## 2013-01-30 DIAGNOSIS — G4733 Obstructive sleep apnea (adult) (pediatric): Secondary | ICD-10-CM | POA: Insufficient documentation

## 2013-01-30 DIAGNOSIS — IMO0002 Reserved for concepts with insufficient information to code with codable children: Secondary | ICD-10-CM | POA: Insufficient documentation

## 2013-01-30 DIAGNOSIS — E119 Type 2 diabetes mellitus without complications: Secondary | ICD-10-CM | POA: Insufficient documentation

## 2013-01-30 DIAGNOSIS — K219 Gastro-esophageal reflux disease without esophagitis: Secondary | ICD-10-CM | POA: Insufficient documentation

## 2013-01-30 DIAGNOSIS — Y939 Activity, unspecified: Secondary | ICD-10-CM | POA: Insufficient documentation

## 2013-01-30 DIAGNOSIS — S3120XA Unspecified open wound of penis, initial encounter: Secondary | ICD-10-CM | POA: Insufficient documentation

## 2013-01-30 DIAGNOSIS — Z791 Long term (current) use of non-steroidal anti-inflammatories (NSAID): Secondary | ICD-10-CM | POA: Insufficient documentation

## 2013-01-30 DIAGNOSIS — I428 Other cardiomyopathies: Secondary | ICD-10-CM | POA: Insufficient documentation

## 2013-01-30 DIAGNOSIS — S3121XA Laceration without foreign body of penis, initial encounter: Secondary | ICD-10-CM

## 2013-01-30 DIAGNOSIS — Y929 Unspecified place or not applicable: Secondary | ICD-10-CM | POA: Insufficient documentation

## 2013-01-30 DIAGNOSIS — Z950 Presence of cardiac pacemaker: Secondary | ICD-10-CM | POA: Insufficient documentation

## 2013-01-30 DIAGNOSIS — X58XXXA Exposure to other specified factors, initial encounter: Secondary | ICD-10-CM | POA: Insufficient documentation

## 2013-01-30 DIAGNOSIS — Z79899 Other long term (current) drug therapy: Secondary | ICD-10-CM | POA: Insufficient documentation

## 2013-01-30 LAB — GLUCOSE, CAPILLARY
Glucose-Capillary: 404 mg/dL — ABNORMAL HIGH (ref 70–99)
Glucose-Capillary: 328 mg/dL — ABNORMAL HIGH (ref 70–99)

## 2013-01-30 LAB — PROTIME-INR
INR: 2.96 — ABNORMAL HIGH (ref 0.00–1.49)
Prothrombin Time: 29.8 seconds — ABNORMAL HIGH (ref 11.6–15.2)

## 2013-01-30 MED ORDER — HYDROCODONE-ACETAMINOPHEN 5-325 MG PO TABS
1.0000 | ORAL_TABLET | Freq: Once | ORAL | Status: AC
  Administered 2013-01-30: 1 via ORAL
  Filled 2013-01-30: qty 1

## 2013-01-30 MED ORDER — LIDOCAINE HCL 2 % EX GEL
Freq: Once | CUTANEOUS | Status: AC
  Administered 2013-01-30: 20 via URETHRAL
  Filled 2013-01-30: qty 20

## 2013-01-30 MED ORDER — INSULIN ASPART 100 UNIT/ML ~~LOC~~ SOLN
45.0000 [IU] | Freq: Once | SUBCUTANEOUS | Status: AC
  Administered 2013-01-30: 45 [IU] via SUBCUTANEOUS
  Filled 2013-01-30: qty 1

## 2013-01-30 NOTE — ED Notes (Signed)
Pt reports he started bleeding from the external location of penis . The bleeding has occurred in past but Pt. Did not seek medical assistance. Pt reports he cut it on something. Bright red blood [present on arrival to ED. Pt denies pain at this time. Pt does take coumadin.

## 2013-01-30 NOTE — ED Provider Notes (Signed)
CSN: [id number]     Arrival date & time 01/30/13  0831 History   First MD Initiated Contact with Patient 01/30/13 [phone]     Chief Complaint  Patient presents with  . Penile Discharge    bleeding   (Consider location/radiation/quality/duration/timing/severity/associated sxs/prior Treatment) HPI  48 year old morbidly obese patient with history of atrial fibrillation currently on warfarin presents for evaluation of bleeding from skin of penis.  Pt report he may have stretched the foreskin of his penis sometimes this week when it started to bleed. Bleeding lasting 30 min initially and a scab was formed which stopped the bleed.  Yesterday he accidentally scratched the foreskin again causing it to bleed, was able to apply pressure and it stopped after a few hrs but this morning it started to bleed again. C/o sharp pain to affected area.  Denies fever, lightheadedness, dizziness, dysuria, or rash.  Sts last INR level was last month and it was 2.5.  Is scheduled to have his INR checked today.  Pt is also a dialysis pt, T/Th/Sat.    Past Medical History  Diagnosis Date  . Other and unspecified hyperlipidemia   . Insomnia, unspecified   . Obstructive sleep apnea (adult) (pediatric)   . Allergic rhinitis, cause unspecified   . Nonischemic cardiomyopathy     s/p St. Jude ICD  . Unspecified essential hypertension   . Type II or unspecified type diabetes mellitus without mention of complication, not stated as uncontrolled   . CKD (chronic kidney disease) stage 3, GFR 30-59 ml/min   . Esophageal reflux   . Morbid obesity   . Dysuria   . Tobacco use disorder   . Dysrhythmia   . Pacemaker   . Antral ulcer   . Renal azotemia   . Arthritis     Gout w/hyperuricemia  . Morbid obesity    Past Surgical History  Procedure Laterality Date  . Cardiac defibrillator placement  2011    St. Jude  . Esophagogastroduodenoscopy  01/03/2011    Procedure: ESOPHAGOGASTRODUODENOSCOPY (EGD);  Surgeon: Vertell Novak., MD;  Location: [facility] ENDOSCOPY;  Service: Endoscopy;  Laterality: N/A;  . Av fistula placement Right 08/31/2012    Procedure: ARTERIOVENOUS (AV) FISTULA CREATION;  Surgeon: Chuck Hint, MD;  Location: [facility] OR;  Service: Vascular;  Laterality: Right;  . Insertion of dialysis catheter Right 08/31/2012    Procedure: INSERTION OF DIALYSIS CATHETER-Right Internal Jugular Placement;  Surgeon: Chuck Hint, MD;  Location: [facility] OR;  Service: Vascular;  Laterality: Right;   Family History  Problem Relation Age of Onset  . Diabetes Mother   . Microcephaly Father   . Lung cancer Father    History  Substance Use Topics  . Smoking status: Current Every Day Smoker -- 0.25 packs/day    Types: Cigarettes  . Smokeless tobacco: Never Used     Comment:  pt states that he will quit on his own and is in the process of quiting  . Alcohol Use: No     Comment: No alcohol x 1 month.    Review of Systems  Constitutional: Negative for fever.  Genitourinary: Positive for penile pain.  Skin: Positive for wound.  Hematological: Bruises/bleeds easily.    Allergies  Argatroban; Ace inhibitors; and Heparin  Home Medications   Current Outpatient Rx  Name  Route  Sig  Dispense  Refill  . acetaminophen (TYLENOL) 500 MG tablet   Oral   Take 500 mg by mouth every 6 (six) hours as  needed.         Marland Kitchen amiodarone (PACERONE) 200 MG tablet   Oral   Take 200 mg by mouth daily.         . colchicine 0.6 MG tablet   Oral   Take 1 tablet (0.6 mg total) by mouth See admin instructions. Takes every three days.   15 tablet   2   . diclofenac sodium (VOLTAREN) 1 % GEL   Topical   Apply 2 g topically 4 (four) times daily.   1 Tube   2   . esomeprazole (NEXIUM 24HR) 20 MG capsule      TAKE ONE CAPSULE EVERY DAY BEFORE BREAKFAST   30 capsule   3   . Febuxostat (ULORIC) 80 MG TABS   Oral   Take 1 tablet (80 mg total) by mouth daily.   30 tablet   5   . fluticasone (FLONASE) 50  MCG/ACT nasal spray   Each Nare   Place 1 spray into both nostrils daily as needed for allergies. For allergies   16 g   2   . Insulin Glargine (LANTUS SOLOSTAR) 100 UNIT/ML Solostar Pen   Subcutaneous   Inject 40 Units into the skin daily at 10 pm.         . loratadine (CLARITIN) 10 MG tablet   Oral   Take 1 tablet (10 mg total) by mouth as needed.   30 tablet   3   . LORazepam (ATIVAN) 0.5 MG tablet      TAKE 1 TABLET TWICE A DAY AS NEEDED   60 tablet   1   . traMADol (ULTRAM) 50 MG tablet      TAKE 1 TABLET BY MOUTH EVERY 6 HOURS AS NEEDED FOR PAIN   60 tablet   2   . warfarin (COUMADIN) 2.5 MG tablet      TAKE 3 TABLETS (7.5 MG TOTAL) BY MOUTH DAILY.   90 tablet   1    BP 96/51  Pulse 95  Temp(Src) 99.7 F (37.6 C) (Oral)  Resp 18  SpO2 96% Physical Exam  Constitutional:  Morbidly obese, in NAD  Neck: Neck supple.  Abdominal: Soft. There is no tenderness.  Genitourinary:  Bleeding noted at the frenulum of foreskin, ttp.  Uncircumcised penis.    Neurological: He is alert.  Skin: Skin is warm.  Psychiatric: He has a normal mood and affect.    ED Course  Procedures (including critical care time   10:58 AM Pt with a skin tear to frenulum of an uncircumcised penis.  Wound approximately 3mm, actively bleeding.  i was able to sutured and provide hemostasis.  INR is therapeutic at 2.96.  Has elevated CBG of 404, but did not take her morning insulin.  Will give morning dose.  Otherwise stable for discharge.  Sutures to be removed in 5 days.  Pt is aware.    LACERATION REPAIR Performed by: Fayrene Helper Authorized byFayrene Helper Consent: Verbal consent obtained. Risks and benefits: risks, benefits and alternatives were discussed Consent given by: patient Patient identity confirmed: provided demographic data Prepped and Draped in normal sterile fashion Wound explored  Laceration Location: frenulum of penis  Laceration Length: 0.4cm  No Foreign  Bodies seen or palpated  Anesthesia: local infiltration  Local anesthetic: lidocaine 2% w/o epinephrine  Anesthetic total: 1 ml  Irrigation method: syringe Amount of cleaning: standard  Skin closure: prolene 6.0  Number of sutures: 3  Technique: simple interrupted  Patient tolerance: Patient  tolerated the procedure well with no immediate complications.  12:02 PM Prior to discharge, vital sign recheck showing evidence of elevated HR in 120s, and O2 92%.  Pt denies SOB, report that he has elevated heart rate at home as well, sts his doctor will seen him for that.  He report having bilateral leg swelling which he will be cared for by his PCP this month.  I express the possibility of DVT/PE, but pt currently on warfarin and does not want further work up for that, stating his primary goal is to have his bleed cared for.  I recommend close f/u with his PCP, and return if sxs worsen.  Pt voice understanding and agrees with plan.    Labs Review Labs Reviewed  PROTIME-INR - Abnormal; Notable for the following:    Prothrombin Time 29.8 (*)    INR 2.96 (*)    All other components within normal limits  GLUCOSE, CAPILLARY - Abnormal; Notable for the following:    Glucose-Capillary 404 (*)    All other components within normal limits   Imaging Review No results found.  EKG Interpretation   None       MDM   1. Penile laceration, initial encounter    BP 88/52  Pulse 95  Temp(Src) 99.7 F (37.6 C) (Oral)  Resp 20  SpO2 96%     Fayrene HelperBowie Aleigha Gilani, PA-C 01/30/13 1205

## 2013-01-30 NOTE — ED Provider Notes (Signed)
Medical screening examination/treatment/procedure(s) were performed by non-physician practitioner and as supervising physician I was immediately available for consultation/collaboration.   Nelia Shi, MD 01/30/13 1213

## 2013-01-30 NOTE — ED Notes (Signed)
CBG 328 

## 2013-01-30 NOTE — Discharge Instructions (Signed)
Please have your sutures removed in 5 days by your doctor.  Take your insulin as prescribed.  Return if bleeding persists, worsen or if you have other concerns. Your coumadin level is 2.96 today, and is therapeutic.  Laceration Care, Adult A laceration is a cut or lesion that goes through all layers of the skin and into the tissue just beneath the skin. TREATMENT  Some lacerations may not require closure. Some lacerations may not be able to be closed due to an increased risk of infection. It is important to see your caregiver as soon as possible after an injury to minimize the risk of infection and maximize the opportunity for successful closure. If closure is appropriate, pain medicines may be given, if needed. The wound will be cleaned to help prevent infection. Your caregiver will use stitches (sutures), staples, wound glue (adhesive), or skin adhesive strips to repair the laceration. These tools bring the skin edges together to allow for faster healing and a better cosmetic outcome. However, all wounds will heal with a scar. Once the wound has healed, scarring can be minimized by covering the wound with sunscreen during the day for 1 full year. HOME CARE INSTRUCTIONS  For sutures or staples:  Keep the wound clean and dry.  If you were given a bandage (dressing), you should change it at least once a day. Also, change the dressing if it becomes wet or dirty, or as directed by your caregiver.  Wash the wound with soap and water 2 times a day. Rinse the wound off with water to remove all soap. Pat the wound dry with a clean towel.  After cleaning, apply a thin layer of the antibiotic ointment as recommended by your caregiver. This will help prevent infection and keep the dressing from sticking.  You may shower as usual after the first 24 hours. Do not soak the wound in water until the sutures are removed.  Only take over-the-counter or prescription medicines for pain, discomfort, or fever as  directed by your caregiver.  Get your sutures or staples removed as directed by your caregiver. For skin adhesive strips:  Keep the wound clean and dry.  Do not get the skin adhesive strips wet. You may bathe carefully, using caution to keep the wound dry.  If the wound gets wet, pat it dry with a clean towel.  Skin adhesive strips will fall off on their own. You may trim the strips as the wound heals. Do not remove skin adhesive strips that are still stuck to the wound. They will fall off in time. For wound adhesive:  You may briefly wet your wound in the shower or bath. Do not soak or scrub the wound. Do not swim. Avoid periods of heavy perspiration until the skin adhesive has fallen off on its own. After showering or bathing, gently pat the wound dry with a clean towel.  Do not apply liquid medicine, cream medicine, or ointment medicine to your wound while the skin adhesive is in place. This may loosen the film before your wound is healed.  If a dressing is placed over the wound, be careful not to apply tape directly over the skin adhesive. This may cause the adhesive to be pulled off before the wound is healed.  Avoid prolonged exposure to sunlight or tanning lamps while the skin adhesive is in place. Exposure to ultraviolet light in the first year will darken the scar.  The skin adhesive will usually remain in place for 5 to 10  days, then naturally fall off the skin. Do not pick at the adhesive film. You may need a tetanus shot if:  You cannot remember when you had your last tetanus shot.  You have never had a tetanus shot. If you get a tetanus shot, your arm may swell, get red, and feel warm to the touch. This is common and not a problem. If you need a tetanus shot and you choose not to have one, there is a rare chance of getting tetanus. Sickness from tetanus can be serious. SEEK MEDICAL CARE IF:   You have redness, swelling, or increasing pain in the wound.  You see a red  line that goes away from the wound.  You have yellowish-white fluid (pus) coming from the wound.  You have a fever.  You notice a bad smell coming from the wound or dressing.  Your wound breaks open before or after sutures have been removed.  You notice something coming out of the wound such as wood or glass.  Your wound is on your hand or foot and you cannot move a finger or toe. SEEK IMMEDIATE MEDICAL CARE IF:   Your pain is not controlled with prescribed medicine.  You have severe swelling around the wound causing pain and numbness or a change in color in your arm, hand, leg, or foot.  Your wound splits open and starts bleeding.  You have worsening numbness, weakness, or loss of function of any joint around or beyond the wound.  You develop painful lumps near the wound or on the skin anywhere on your body. MAKE SURE YOU:   Understand these instructions.  Will watch your condition.  Will get help right away if you are not doing well or get worse. Document Released: 01/13/2005 Document Revised: 04/07/2011 Document Reviewed: 07/09/2010 [facility] Patient Information 2014 [place], [place].

## 2013-01-30 NOTE — ED Notes (Signed)
PT pulse Ox 97 on RA. Laveda Norman PA aware.

## 2013-01-31 ENCOUNTER — Ambulatory Visit: Payer: Medicaid Other

## 2013-02-03 ENCOUNTER — Emergency Department (HOSPITAL_COMMUNITY): Payer: Medicaid Other

## 2013-02-03 ENCOUNTER — Encounter (HOSPITAL_COMMUNITY): Payer: Self-pay | Admitting: Emergency Medicine

## 2013-02-03 ENCOUNTER — Inpatient Hospital Stay (HOSPITAL_COMMUNITY)
Admission: EM | Admit: 2013-02-03 | Discharge: 2013-02-16 | DRG: 871 | Disposition: A | Discharge status: Rehabilitation Facility | Payer: Medicaid Other | Attending: Internal Medicine | Admitting: Internal Medicine

## 2013-02-03 DIAGNOSIS — N471 Phimosis: Secondary | ICD-10-CM | POA: Diagnosis present

## 2013-02-03 DIAGNOSIS — L97509 Non-pressure chronic ulcer of other part of unspecified foot with unspecified severity: Secondary | ICD-10-CM | POA: Diagnosis present

## 2013-02-03 DIAGNOSIS — Z22322 Carrier or suspected carrier of Methicillin resistant Staphylococcus aureus: Secondary | ICD-10-CM

## 2013-02-03 DIAGNOSIS — N4889 Other specified disorders of penis: Secondary | ICD-10-CM | POA: Diagnosis present

## 2013-02-03 DIAGNOSIS — I959 Hypotension, unspecified: Secondary | ICD-10-CM | POA: Diagnosis present

## 2013-02-03 DIAGNOSIS — N4829 Other inflammatory disorders of penis: Secondary | ICD-10-CM | POA: Diagnosis present

## 2013-02-03 DIAGNOSIS — Z6841 Body Mass Index (BMI) 40.0 and over, adult: Secondary | ICD-10-CM

## 2013-02-03 DIAGNOSIS — I9589 Other hypotension: Secondary | ICD-10-CM | POA: Diagnosis present

## 2013-02-03 DIAGNOSIS — N478 Other disorders of prepuce: Secondary | ICD-10-CM | POA: Diagnosis present

## 2013-02-03 DIAGNOSIS — N481 Balanitis: Secondary | ICD-10-CM

## 2013-02-03 DIAGNOSIS — Z794 Long term (current) use of insulin: Secondary | ICD-10-CM

## 2013-02-03 DIAGNOSIS — I509 Heart failure, unspecified: Secondary | ICD-10-CM | POA: Diagnosis present

## 2013-02-03 DIAGNOSIS — F411 Generalized anxiety disorder: Secondary | ICD-10-CM

## 2013-02-03 DIAGNOSIS — Z7901 Long term (current) use of anticoagulants: Secondary | ICD-10-CM

## 2013-02-03 DIAGNOSIS — K219 Gastro-esophageal reflux disease without esophagitis: Secondary | ICD-10-CM | POA: Diagnosis present

## 2013-02-03 DIAGNOSIS — Z833 Family history of diabetes mellitus: Secondary | ICD-10-CM

## 2013-02-03 DIAGNOSIS — I4892 Unspecified atrial flutter: Secondary | ICD-10-CM | POA: Diagnosis present

## 2013-02-03 DIAGNOSIS — M79605 Pain in left leg: Secondary | ICD-10-CM | POA: Diagnosis present

## 2013-02-03 DIAGNOSIS — L02419 Cutaneous abscess of limb, unspecified: Secondary | ICD-10-CM | POA: Diagnosis present

## 2013-02-03 DIAGNOSIS — M25569 Pain in unspecified knee: Secondary | ICD-10-CM | POA: Diagnosis present

## 2013-02-03 DIAGNOSIS — L97809 Non-pressure chronic ulcer of other part of unspecified lower leg with unspecified severity: Secondary | ICD-10-CM | POA: Diagnosis present

## 2013-02-03 DIAGNOSIS — G4700 Insomnia, unspecified: Secondary | ICD-10-CM | POA: Diagnosis present

## 2013-02-03 DIAGNOSIS — I120 Hypertensive chronic kidney disease with stage 5 chronic kidney disease or end stage renal disease: Secondary | ICD-10-CM | POA: Diagnosis present

## 2013-02-03 DIAGNOSIS — M129 Arthropathy, unspecified: Secondary | ICD-10-CM | POA: Diagnosis present

## 2013-02-03 DIAGNOSIS — N186 End stage renal disease: Secondary | ICD-10-CM | POA: Diagnosis present

## 2013-02-03 DIAGNOSIS — L03119 Cellulitis of unspecified part of limb: Secondary | ICD-10-CM

## 2013-02-03 DIAGNOSIS — I739 Peripheral vascular disease, unspecified: Secondary | ICD-10-CM | POA: Diagnosis present

## 2013-02-03 DIAGNOSIS — D649 Anemia, unspecified: Secondary | ICD-10-CM | POA: Diagnosis present

## 2013-02-03 DIAGNOSIS — R339 Retention of urine, unspecified: Secondary | ICD-10-CM | POA: Diagnosis present

## 2013-02-03 DIAGNOSIS — T45515A Adverse effect of anticoagulants, initial encounter: Secondary | ICD-10-CM | POA: Diagnosis present

## 2013-02-03 DIAGNOSIS — F329 Major depressive disorder, single episode, unspecified: Secondary | ICD-10-CM | POA: Diagnosis present

## 2013-02-03 DIAGNOSIS — M109 Gout, unspecified: Secondary | ICD-10-CM | POA: Diagnosis present

## 2013-02-03 DIAGNOSIS — E785 Hyperlipidemia, unspecified: Secondary | ICD-10-CM | POA: Diagnosis present

## 2013-02-03 DIAGNOSIS — I5022 Chronic systolic (congestive) heart failure: Secondary | ICD-10-CM

## 2013-02-03 DIAGNOSIS — Z992 Dependence on renal dialysis: Secondary | ICD-10-CM

## 2013-02-03 DIAGNOSIS — D631 Anemia in chronic kidney disease: Secondary | ICD-10-CM | POA: Diagnosis present

## 2013-02-03 DIAGNOSIS — Z888 Allergy status to other drugs, medicaments and biological substances status: Secondary | ICD-10-CM

## 2013-02-03 DIAGNOSIS — N390 Urinary tract infection, site not specified: Secondary | ICD-10-CM | POA: Diagnosis not present

## 2013-02-03 DIAGNOSIS — N039 Chronic nephritic syndrome with unspecified morphologic changes: Secondary | ICD-10-CM

## 2013-02-03 DIAGNOSIS — L02619 Cutaneous abscess of unspecified foot: Secondary | ICD-10-CM | POA: Diagnosis present

## 2013-02-03 DIAGNOSIS — A419 Sepsis, unspecified organism: Principal | ICD-10-CM | POA: Diagnosis present

## 2013-02-03 DIAGNOSIS — S3120XA Unspecified open wound of penis, initial encounter: Secondary | ICD-10-CM

## 2013-02-03 DIAGNOSIS — E1165 Type 2 diabetes mellitus with hyperglycemia: Secondary | ICD-10-CM | POA: Diagnosis present

## 2013-02-03 DIAGNOSIS — F3289 Other specified depressive episodes: Secondary | ICD-10-CM | POA: Diagnosis present

## 2013-02-03 DIAGNOSIS — I428 Other cardiomyopathies: Secondary | ICD-10-CM | POA: Diagnosis present

## 2013-02-03 DIAGNOSIS — G4733 Obstructive sleep apnea (adult) (pediatric): Secondary | ICD-10-CM | POA: Diagnosis present

## 2013-02-03 DIAGNOSIS — I4891 Unspecified atrial fibrillation: Secondary | ICD-10-CM | POA: Diagnosis present

## 2013-02-03 DIAGNOSIS — I96 Gangrene, not elsewhere classified: Secondary | ICD-10-CM | POA: Diagnosis present

## 2013-02-03 DIAGNOSIS — Z79899 Other long term (current) drug therapy: Secondary | ICD-10-CM

## 2013-02-03 DIAGNOSIS — I10 Essential (primary) hypertension: Secondary | ICD-10-CM

## 2013-02-03 DIAGNOSIS — N2581 Secondary hyperparathyroidism of renal origin: Secondary | ICD-10-CM | POA: Diagnosis present

## 2013-02-03 DIAGNOSIS — F17200 Nicotine dependence, unspecified, uncomplicated: Secondary | ICD-10-CM | POA: Diagnosis present

## 2013-02-03 DIAGNOSIS — I5042 Chronic combined systolic (congestive) and diastolic (congestive) heart failure: Secondary | ICD-10-CM | POA: Diagnosis present

## 2013-02-03 DIAGNOSIS — R5381 Other malaise: Secondary | ICD-10-CM | POA: Diagnosis present

## 2013-02-03 DIAGNOSIS — M726 Necrotizing fasciitis: Secondary | ICD-10-CM | POA: Diagnosis present

## 2013-02-03 DIAGNOSIS — Z95810 Presence of automatic (implantable) cardiac defibrillator: Secondary | ICD-10-CM | POA: Diagnosis present

## 2013-02-03 DIAGNOSIS — E1129 Type 2 diabetes mellitus with other diabetic kidney complication: Secondary | ICD-10-CM | POA: Diagnosis present

## 2013-02-03 DIAGNOSIS — Z801 Family history of malignant neoplasm of trachea, bronchus and lung: Secondary | ICD-10-CM

## 2013-02-03 DIAGNOSIS — Z9229 Personal history of other drug therapy: Secondary | ICD-10-CM

## 2013-02-03 HISTORY — DX: Presence of automatic (implantable) cardiac defibrillator: Z95.810

## 2013-02-03 HISTORY — DX: Other disorders of calcium metabolism: E83.59

## 2013-02-03 HISTORY — DX: Ventricular tachycardia: I47.2

## 2013-02-03 HISTORY — DX: Paroxysmal atrial fibrillation: I48.0

## 2013-02-03 HISTORY — DX: Other ventricular tachycardia: I47.29

## 2013-02-03 HISTORY — DX: Ventricular tachycardia, unspecified: I47.20

## 2013-02-03 HISTORY — DX: Unspecified atrial flutter: I48.92

## 2013-02-03 HISTORY — DX: Chronic combined systolic (congestive) and diastolic (congestive) heart failure: I50.42

## 2013-02-03 LAB — COMPREHENSIVE METABOLIC PANEL
ALK PHOS: 188 U/L — AB (ref 39–117)
AST: 9 U/L (ref 0–37)
Albumin: 2.2 g/dL — ABNORMAL LOW (ref 3.5–5.2)
BUN: 30 mg/dL — ABNORMAL HIGH (ref 6–23)
CALCIUM: 9.3 mg/dL (ref 8.4–10.5)
CHLORIDE: 90 meq/L — AB (ref 96–112)
CO2: 25 meq/L (ref 19–32)
Creatinine, Ser: 5.75 mg/dL — ABNORMAL HIGH (ref 0.50–1.35)
GFR calc Af Amer: 12 mL/min — ABNORMAL LOW (ref >90)
GFR, EST NON AFRICAN AMERICAN: 11 mL/min — AB (ref >90)
GLUCOSE: 114 mg/dL — AB (ref 70–99)
Potassium: 4.8 mEq/L (ref 3.7–5.3)
SODIUM: 134 meq/L — AB (ref 137–147)
Total Bilirubin: 1.6 mg/dL — ABNORMAL HIGH (ref 0.3–1.2)
Total Protein: 9.1 g/dL — ABNORMAL HIGH (ref 6.0–8.3)

## 2013-02-03 LAB — CBC WITH DIFFERENTIAL/PLATELET
Basophils Absolute: 0 10*3/uL (ref 0.0–0.1)
Basophils Relative: 0 % (ref 0–1)
EOS PCT: 1 % (ref 0–5)
Eosinophils Absolute: 0.2 10*3/uL (ref 0.0–0.7)
HEMATOCRIT: 27 % — AB (ref 39.0–52.0)
Hemoglobin: 8.3 g/dL — ABNORMAL LOW (ref 13.0–17.0)
LYMPHS ABS: 1.4 10*3/uL (ref 0.7–4.0)
LYMPHS PCT: 9 % — AB (ref 12–46)
MCH: 27.9 pg (ref 26.0–34.0)
MCHC: 30.7 g/dL (ref 30.0–36.0)
MCV: 90.6 fL (ref 78.0–100.0)
Monocytes Absolute: 1.6 10*3/uL — ABNORMAL HIGH (ref 0.1–1.0)
Monocytes Relative: 10 % (ref 3–12)
Neutro Abs: 12.6 10*3/uL — ABNORMAL HIGH (ref 1.7–7.7)
Neutrophils Relative %: 79 % — ABNORMAL HIGH (ref 43–77)
Platelets: 478 10*3/uL — ABNORMAL HIGH (ref 150–400)
RBC: 2.98 MIL/uL — ABNORMAL LOW (ref 4.22–5.81)
RDW: 16.1 % — ABNORMAL HIGH (ref 11.5–15.5)
WBC: 15.9 10*3/uL — AB (ref 4.0–10.5)

## 2013-02-03 LAB — GLUCOSE, CAPILLARY
GLUCOSE-CAPILLARY: 102 mg/dL — AB (ref 70–99)
Glucose-Capillary: 146 mg/dL — ABNORMAL HIGH (ref 70–99)
Glucose-Capillary: 96 mg/dL (ref 70–99)

## 2013-02-03 LAB — HEPATITIS B SURFACE ANTIGEN: Hepatitis B Surface Ag: NEGATIVE

## 2013-02-03 LAB — TROPONIN I: Troponin I: <0.3 ng/mL (ref <0.30)

## 2013-02-03 LAB — PROTIME-INR
INR: 2.2 — ABNORMAL HIGH (ref 0.00–1.49)
PROTHROMBIN TIME: 23.7 s — AB (ref 11.6–15.2)

## 2013-02-03 LAB — MRSA PCR SCREENING: MRSA by PCR: POSITIVE — AB

## 2013-02-03 LAB — CG4 I-STAT (LACTIC ACID): Lactic Acid, Venous: 3.77 mmol/L — ABNORMAL HIGH (ref 0.5–2.2)

## 2013-02-03 LAB — FERRITIN: Ferritin: 275 ng/mL (ref 22–322)

## 2013-02-03 MED ORDER — LIDOCAINE HCL (PF) 1 % IJ SOLN: 5.0000 mL | INTRAMUSCULAR | Status: DC | PRN

## 2013-02-03 MED ORDER — MUPIROCIN 2 % EX OINT
1.0000 "application " | TOPICAL_OINTMENT | Freq: Two times a day (BID) | CUTANEOUS | Status: AC
  Administered 2013-02-04 – 2013-02-08 (×9): 1 via NASAL
  Filled 2013-02-03 (×2): qty 22

## 2013-02-03 MED ORDER — SODIUM CHLORIDE 0.9 % IV SOLN: 250.0000 mL | INTRAVENOUS | Status: DC | PRN

## 2013-02-03 MED ORDER — SODIUM CHLORIDE 0.9 % IV SOLN: 100.0000 mL | INTRAVENOUS | Status: DC | PRN

## 2013-02-03 MED ORDER — DOXERCALCIFEROL 4 MCG/2ML IV SOLN
6.0000 ug | INTRAVENOUS | Status: DC
  Administered 2013-02-03: 6 ug via INTRAVENOUS
  Filled 2013-02-03 (×2): qty 4

## 2013-02-03 MED ORDER — PANTOPRAZOLE SODIUM 40 MG PO TBEC
40.0000 mg | DELAYED_RELEASE_TABLET | Freq: Every day | ORAL | Status: DC
  Administered 2013-02-04 – 2013-02-16 (×13): 40 mg via ORAL
  Filled 2013-02-03 (×11): qty 1

## 2013-02-03 MED ORDER — SODIUM CHLORIDE 0.9 % IJ SOLN: 3.0000 mL | INTRAMUSCULAR | Status: DC | PRN

## 2013-02-03 MED ORDER — SODIUM CHLORIDE 0.9 % IV SOLN
125.0000 mg | INTRAVENOUS | Status: DC
  Administered 2013-02-05: 125 mg via INTRAVENOUS
  Filled 2013-02-03 (×4): qty 10

## 2013-02-03 MED ORDER — NEPRO/CARBSTEADY PO LIQD: 237.0000 mL | ORAL | Status: DC | PRN

## 2013-02-03 MED ORDER — DOXERCALCIFEROL 4 MCG/2ML IV SOLN
INTRAVENOUS | Status: AC
  Administered 2013-02-03: 19:00:00 6 ug via INTRAVENOUS
  Filled 2013-02-03: qty 4

## 2013-02-03 MED ORDER — COLCHICINE 0.6 MG PO TABS
0.6000 mg | ORAL_TABLET | ORAL | Status: DC
  Administered 2013-02-03 – 2013-02-15 (×5): 0.6 mg via ORAL
  Filled 2013-02-03 (×5): qty 1

## 2013-02-03 MED ORDER — INSULIN GLARGINE 100 UNIT/ML ~~LOC~~ SOLN
45.0000 [IU] | Freq: Every day | SUBCUTANEOUS | Status: DC
  Administered 2013-02-04 – 2013-02-08 (×5): 45 [IU] via SUBCUTANEOUS
  Filled 2013-02-03 (×7): qty 0.45

## 2013-02-03 MED ORDER — CHLORHEXIDINE GLUCONATE CLOTH 2 % EX PADS
6.0000 | MEDICATED_PAD | Freq: Every day | CUTANEOUS | Status: AC
  Administered 2013-02-05 – 2013-02-08 (×4): 6 via TOPICAL

## 2013-02-03 MED ORDER — ALTEPLASE 2 MG IJ SOLR
2.0000 mg | Freq: Once | INTRAMUSCULAR | Status: DC | PRN
  Filled 2013-02-03: qty 2

## 2013-02-03 MED ORDER — LIDOCAINE-PRILOCAINE 2.5-2.5 % EX CREA
1.0000 | TOPICAL_CREAM | CUTANEOUS | Status: DC | PRN
Start: 2013-02-03 — End: 2013-02-03

## 2013-02-03 MED ORDER — PENTAFLUOROPROP-TETRAFLUOROETH EX AERO: 1.0000 "application " | INHALATION_SPRAY | CUTANEOUS | Status: DC | PRN

## 2013-02-03 MED ORDER — SODIUM CHLORIDE 0.9 % IJ SOLN
3.0000 mL | Freq: Two times a day (BID) | INTRAMUSCULAR | Status: DC
  Administered 2013-02-03: 3 mL via INTRAVENOUS

## 2013-02-03 MED ORDER — LIDOCAINE-PRILOCAINE 2.5-2.5 % EX CREA: 1.0000 "application " | TOPICAL_CREAM | CUTANEOUS | Status: DC | PRN

## 2013-02-03 MED ORDER — LORATADINE 10 MG PO TABS
10.0000 mg | ORAL_TABLET | Freq: Every day | ORAL | Status: DC | PRN
  Administered 2013-02-03: 10 mg via ORAL
  Filled 2013-02-03: qty 1

## 2013-02-03 MED ORDER — WARFARIN - PHARMACIST DOSING INPATIENT: Freq: Every day | Status: DC

## 2013-02-03 MED ORDER — AMIODARONE HCL 200 MG PO TABS
200.0000 mg | ORAL_TABLET | Freq: Every day | ORAL | Status: DC
  Administered 2013-02-03 – 2013-02-11 (×8): 200 mg via ORAL
  Filled 2013-02-03 (×9): qty 1

## 2013-02-03 MED ORDER — FLUCONAZOLE 150 MG PO TABS
150.0000 mg | ORAL_TABLET | Freq: Once | ORAL | Status: AC
  Administered 2013-02-03: 150 mg via ORAL
  Filled 2013-02-03: qty 1

## 2013-02-03 MED ORDER — VANCOMYCIN HCL IN DEXTROSE 1-5 GM/200ML-% IV SOLN
1000.0000 mg | Freq: Once | INTRAVENOUS | Status: AC
  Administered 2013-02-03: 1000 mg via INTRAVENOUS
  Filled 2013-02-03: qty 200

## 2013-02-03 MED ORDER — FEBUXOSTAT 80 MG PO TABS
1.0000 | ORAL_TABLET | Freq: Every day | ORAL | Status: DC
  Administered 2013-02-03 – 2013-02-16 (×14): 80 mg via ORAL
  Filled 2013-02-03 (×14): qty 1

## 2013-02-03 MED ORDER — SEVELAMER CARBONATE 800 MG PO TABS
1600.0000 mg | ORAL_TABLET | Freq: Three times a day (TID) | ORAL | Status: DC
  Administered 2013-02-04 – 2013-02-09 (×11): 1600 mg via ORAL
  Filled 2013-02-03 (×20): qty 2

## 2013-02-03 MED ORDER — ACETAMINOPHEN 500 MG PO TABS
500.0000 mg | ORAL_TABLET | Freq: Four times a day (QID) | ORAL | Status: DC | PRN
  Administered 2013-02-04 – 2013-02-06 (×2): 500 mg via ORAL
  Administered 2013-02-08: 325 mg via ORAL
  Filled 2013-02-03 (×3): qty 1

## 2013-02-03 MED ORDER — SODIUM CHLORIDE 0.9 % IV SOLN
20.0000 mL | INTRAVENOUS | Status: DC
  Administered 2013-02-03: 500 mL via INTRAVENOUS

## 2013-02-03 MED ORDER — RENA-VITE PO TABS
1.0000 | ORAL_TABLET | Freq: Every day | ORAL | Status: DC
  Administered 2013-02-03 – 2013-02-15 (×12): 1 via ORAL
  Filled 2013-02-03 (×15): qty 1

## 2013-02-03 MED ORDER — DICLOFENAC SODIUM 1 % TD GEL
2.0000 g | Freq: Four times a day (QID) | TRANSDERMAL | Status: DC
  Administered 2013-02-03 – 2013-02-16 (×37): 2 g via TOPICAL
  Filled 2013-02-03 (×3): qty 100

## 2013-02-03 MED ORDER — INSULIN ASPART 100 UNIT/ML ~~LOC~~ SOLN
0.0000 [IU] | SUBCUTANEOUS | Status: DC
  Administered 2013-02-04: 1 [IU] via SUBCUTANEOUS
  Administered 2013-02-05 (×2): 2 [IU] via SUBCUTANEOUS
  Administered 2013-02-05: 04:00:00 1 [IU] via SUBCUTANEOUS
  Administered 2013-02-06: 3 [IU] via SUBCUTANEOUS

## 2013-02-03 MED ORDER — PIPERACILLIN-TAZOBACTAM 3.375 G IVPB
3.3750 g | Freq: Once | INTRAVENOUS | Status: AC
  Administered 2013-02-03: 3.375 g via INTRAVENOUS
  Filled 2013-02-03: qty 50

## 2013-02-03 MED ORDER — DARBEPOETIN ALFA-POLYSORBATE 200 MCG/0.4ML IJ SOLN
INTRAMUSCULAR | Status: AC
  Administered 2013-02-03: 19:00:00 200 ug via INTRAVENOUS
  Filled 2013-02-03: qty 0.4

## 2013-02-03 MED ORDER — INSULIN GLARGINE 100 UNIT/ML SOLOSTAR PEN: 45.0000 [IU] | PEN_INJECTOR | Freq: Every day | SUBCUTANEOUS | Status: DC

## 2013-02-03 MED ORDER — DARBEPOETIN ALFA-POLYSORBATE 200 MCG/0.4ML IJ SOLN
200.0000 ug | INTRAMUSCULAR | Status: DC
  Administered 2013-02-03 – 2013-02-10 (×2): 200 ug via INTRAVENOUS
  Filled 2013-02-03 (×2): qty 0.4

## 2013-02-03 MED ORDER — WHITE PETROLATUM GEL
Status: AC
  Administered 2013-02-03: 12:00:00
  Filled 2013-02-03: qty 5

## 2013-02-03 MED ORDER — AMOXICILLIN-POT CLAVULANATE 500-125 MG PO TABS
1.0000 | ORAL_TABLET | Freq: Three times a day (TID) | ORAL | Status: DC
  Administered 2013-02-03 – 2013-02-05 (×4): 500 mg via ORAL
  Filled 2013-02-03 (×9): qty 1

## 2013-02-03 MED ORDER — WARFARIN SODIUM 2.5 MG PO TABS
3.7500 mg | ORAL_TABLET | Freq: Once | ORAL | Status: DC
  Filled 2013-02-03: qty 1

## 2013-02-03 MED ORDER — LORAZEPAM 0.5 MG PO TABS
0.5000 mg | ORAL_TABLET | Freq: Every day | ORAL | Status: DC
  Administered 2013-02-04 – 2013-02-16 (×12): 0.5 mg via ORAL
  Filled 2013-02-03 (×12): qty 1

## 2013-02-03 MED ORDER — TRAMADOL HCL 50 MG PO TABS
100.0000 mg | ORAL_TABLET | Freq: Two times a day (BID) | ORAL | Status: DC
  Administered 2013-02-03 – 2013-02-16 (×23): 100 mg via ORAL
  Filled 2013-02-03 (×23): qty 2

## 2013-02-03 MED ORDER — MORPHINE SULFATE 2 MG/ML IJ SOLN
2.0000 mg | Freq: Once | INTRAMUSCULAR | Status: AC
  Administered 2013-02-03: 2 mg via INTRAVENOUS
  Filled 2013-02-03: qty 1

## 2013-02-03 MED ORDER — FLUTICASONE PROPIONATE 50 MCG/ACT NA SUSP
1.0000 | Freq: Every day | NASAL | Status: DC | PRN
  Filled 2013-02-03: qty 16

## 2013-02-03 MED ORDER — SODIUM CHLORIDE 0.9 % IV BOLUS (SEPSIS)
500.0000 mL | Freq: Once | INTRAVENOUS | Status: AC
  Administered 2013-02-03: 500 mL via INTRAVENOUS

## 2013-02-03 NOTE — Progress Notes (Signed)
ANTICOAGULATION CONSULT NOTE - Initial Consult  Pharmacy Consult for Coumadin Indication: atrial fibrillation  Allergies  Allergen Reactions  . Argatroban Other (See Comments)    Ventricular tachycardia  . Ace Inhibitors     Acute renal failure with multiple trials  . Heparin     Thrombcytopenia. SRA positive. See miscellaneous test (SRA results)    Patient Measurements:    Vital Signs: Temp: 100.3 F (37.9 C) (01/08 0618) Temp src: Rectal (01/08 0618) BP: 84/64 mmHg (01/08 0911) Pulse Rate: 64 (01/08 0911)  Labs:  Recent Labs  02/03/13 0545  HGB 8.3*  HCT 27.0*  PLT 478*  LABPROT 23.7*  INR 2.20*  CREATININE 5.75*  TROPONINI <0.30    The CrCl is unknown because both a height and weight (above a minimum accepted value) are required for this calculation.   Medical History: Past Medical History  Diagnosis Date  . Other and unspecified hyperlipidemia   . Insomnia, unspecified   . Obstructive sleep apnea (adult) (pediatric)   . Allergic rhinitis, cause unspecified   . Nonischemic cardiomyopathy     s/p St. Jude ICD  . Unspecified essential hypertension   . Type II or unspecified type diabetes mellitus without mention of complication, not stated as uncontrolled   . CKD (chronic kidney disease) stage 3, GFR 30-59 ml/min   . Esophageal reflux   . Morbid obesity   . Dysuria   . Tobacco use disorder   . Dysrhythmia   . Pacemaker   . Antral ulcer   . Renal azotemia   . Arthritis     Gout w/hyperuricemia  . Morbid obesity     Medications:  See electronic med rec  Assessment: 48 y.o. male presents with penis pain/discharge and leg pain. Pt on coumadin PTA for afib. Baseline INR therapeutic. Hgb low but pt's baseline ~8 over past 5 months. No bleeding noted. Home dose: 3.75mg  po daily except for 2.5mg  on Mon  Goal of Therapy:  INR 2-3 Monitor platelets by anticoagulation protocol: Yes   Plan:  1. Coumadin 3.75mg  tonight 2. Daily PT/INR  Christoper Fabian, PharmD, BCPS Clinical pharmacist, pager [phone] 02/03/2013,10:03 AM

## 2013-02-03 NOTE — ED Notes (Signed)
Pt. Has a open area on top of his left foot that he states Home Health Nurse has been dressing that was originally a blister. He has several areas on the inside of his left thigh around his knee that were originally blisters also that are also being treated by Home health RN.  The tip of his uncircumcised penis has a white coating on it. It has a foul smelling drainage coming from under the foreskin. States he had stitches put in recently because he scratched it and it wouldn't stop bleeding because he was on coumadin.

## 2013-02-03 NOTE — Telephone Encounter (Signed)
RTC from pt's friend pt would now like to get a Rolator. Ms. Adam [name] friend said that order will need to be sent to [facility] at [phone] Attention Lexine Baton.  Pt is currently inpatient will pass message to pt's doctor. Angelina Ok, RN 02/03/2013 9:36 AM.

## 2013-02-03 NOTE — ED Provider Notes (Signed)
CSN: [id number]     Arrival date & time 02/03/13  0515 History   First MD Initiated Contact with Patient 02/03/13 0540     Chief Complaint  Patient presents with  . Extremity Pain   (Consider location/radiation/quality/duration/timing/severity/associated sxs/prior Treatment) HPI Comments: Patient arrives via EMS with pain in his left leg that has been ongoing for the past several days. He has a history of open wounds to his left lateral leg and dorsum of his foot is being treated by home health care nurse. Today he had too much pain to go to dialysis so he came to the ED. Last dialysis was Tuesday. Denies any fevers at home. Denies any nausea or vomiting. No chest pain or shortness of breath. His has had drainage from the wounds on his left leg. Notably he was seen in the ED 3 days ago for a penile laceration that was sutured. He is on Coumadin for a history of atrial fibrillation.  The history is provided by the patient and the spouse.    Past Medical History  Diagnosis Date  . Other and unspecified hyperlipidemia   . Insomnia, unspecified   . Obstructive sleep apnea (adult) (pediatric)   . Allergic rhinitis, cause unspecified   . Nonischemic cardiomyopathy     s/p St. Jude ICD  . Unspecified essential hypertension   . Type II or unspecified type diabetes mellitus without mention of complication, not stated as uncontrolled   . CKD (chronic kidney disease) stage 3, GFR 30-59 ml/min   . Esophageal reflux   . Morbid obesity   . Dysuria   . Tobacco use disorder   . Dysrhythmia   . Pacemaker   . Antral ulcer   . Renal azotemia   . Arthritis     Gout w/hyperuricemia  . Morbid obesity    Past Surgical History  Procedure Laterality Date  . Cardiac defibrillator placement  2011    St. Jude  . Esophagogastroduodenoscopy  01/03/2011    Procedure: ESOPHAGOGASTRODUODENOSCOPY (EGD);  Surgeon: Vertell Novak., MD;  Location: [facility] ENDOSCOPY;  Service: Endoscopy;  Laterality: N/A;  . Av  fistula placement Right 08/31/2012    Procedure: ARTERIOVENOUS (AV) FISTULA CREATION;  Surgeon: Chuck Hint, MD;  Location: [facility] OR;  Service: Vascular;  Laterality: Right;  . Insertion of dialysis catheter Right 08/31/2012    Procedure: INSERTION OF DIALYSIS CATHETER-Right Internal Jugular Placement;  Surgeon: Chuck Hint, MD;  Location: [facility] OR;  Service: Vascular;  Laterality: Right;   Family History  Problem Relation Age of Onset  . Diabetes Mother   . Microcephaly Father   . Lung cancer Father    History  Substance Use Topics  . Smoking status: Current Every Day Smoker -- 0.25 packs/day    Types: Cigarettes  . Smokeless tobacco: Never Used     Comment:  pt states that he will quit on his own and is in the process of quiting  . Alcohol Use: No     Comment: No alcohol x 1 month.    Review of Systems  Constitutional: Positive for activity change, appetite change and fatigue. Negative for fever.  HENT: Negative for congestion and rhinorrhea.   Eyes: Negative for visual disturbance.  Respiratory: Negative for cough, chest tightness and shortness of breath.   Cardiovascular: Negative for chest pain.  Gastrointestinal: Negative for nausea, vomiting and abdominal pain.  Genitourinary: Negative for dysuria and hematuria.  Musculoskeletal: Positive for arthralgias and myalgias.  Skin: Positive for rash and  wound.  Neurological: Positive for weakness. Negative for dizziness and headaches.  A complete 10 system review of systems was obtained and all systems are negative except as noted in the HPI and PMH.    Allergies  Argatroban; Ace inhibitors; and Heparin  Home Medications   Current Outpatient Rx  Name  Route  Sig  Dispense  Refill  . acetaminophen (TYLENOL) 500 MG tablet   Oral   Take 500 mg by mouth every 6 (six) hours as needed.         Marland Kitchen amiodarone (PACERONE) 200 MG tablet   Oral   Take 200 mg by mouth daily.         . colchicine 0.6 MG tablet    Oral   Take 1 tablet (0.6 mg total) by mouth See admin instructions. Takes every three days.   15 tablet   2   . diclofenac sodium (VOLTAREN) 1 % GEL   Topical   Apply 2 g topically 4 (four) times daily.   1 Tube   2   . esomeprazole (NEXIUM) 20 MG capsule   Oral   Take 20 mg by mouth daily at 12 noon. TAKE ONE CAPSULE EVERY DAY BEFORE BREAKFAST         . Febuxostat (ULORIC) 80 MG TABS   Oral   Take 1 tablet (80 mg total) by mouth daily.   30 tablet   5   . fluticasone (FLONASE) 50 MCG/ACT nasal spray   Each Nare   Place 1 spray into both nostrils daily as needed for allergies. For allergies   16 g   2   . Insulin Glargine (LANTUS SOLOSTAR) 100 UNIT/ML Solostar Pen   Subcutaneous   Inject 45 Units into the skin daily at 10 pm.          . loratadine (CLARITIN) 10 MG tablet   Oral   Take 10 mg by mouth daily as needed for allergies.         Marland Kitchen LORazepam (ATIVAN) 0.5 MG tablet   Oral   Take 0.5 mg by mouth daily. TAKE 1 TABLET TWICE A DAY AS NEEDED         . traMADol (ULTRAM) 50 MG tablet   Oral   Take 100 mg by mouth 2 (two) times daily.         Marland Kitchen warfarin (COUMADIN) 2.5 MG tablet   Oral   Take 2.5 mg by mouth daily. Takes 1 and half tab every day except on Monday. Monday takes just 2.5 mg.          BP 105/66  Pulse 125  Temp(Src) 100.3 F (37.9 C) (Rectal)  Resp 30  SpO2 94% Physical Exam  Constitutional: He is oriented to person, place, and time. He appears well-developed and well-nourished. No distress.  HENT:  Head: Normocephalic and atraumatic.  Mouth/Throat: Oropharynx is clear and moist. No oropharyngeal exudate.  Eyes: Conjunctivae and EOM are normal. Pupils are equal, round, and reactive to light.  Neck: Normal range of motion. Neck supple.  Cardiovascular: Normal rate, regular rhythm and normal heart sounds.   No murmur heard. Pulmonary/Chest: Effort normal and breath sounds normal. No respiratory distress.  Abdominal: Soft. There is  no tenderness. There is no rebound and no guarding.  Genitourinary: Penile tenderness present.  Foreskin is partially retracted. There is a macerated white discoloration to head of the penis with purulence. Surrounding foreskin has areas of maceration with no visible sutures Testicles nontender No cellulitis or  tenderness to buttocks or perineum.   Musculoskeletal: Normal range of motion. He exhibits edema.  Extensive edema of bilateral lower extremities. Open wounds on the left lateral thigh and proximal lower leg. Surrounding induration of the entire medial left leg. Open wound to left dorsal foot.  Neurological: He is alert and oriented to person, place, and time. No cranial nerve deficit. He exhibits normal muscle tone. Coordination normal.  Skin: Skin is warm.    ED Course  Procedures (including critical care time) Labs Review Labs Reviewed  CBC WITH DIFFERENTIAL - Abnormal; Notable for the following:    WBC 15.9 (*)    RBC 2.98 (*)    Hemoglobin 8.3 (*)    HCT 27.0 (*)    RDW 16.1 (*)    Platelets 478 (*)    Neutrophils Relative % 79 (*)    Neutro Abs 12.6 (*)    Lymphocytes Relative 9 (*)    Monocytes Absolute 1.6 (*)    All other components within normal limits  COMPREHENSIVE METABOLIC PANEL - Abnormal; Notable for the following:    Sodium 134 (*)    Chloride 90 (*)    Glucose, Bld 114 (*)    BUN 30 (*)    Creatinine, Ser 5.75 (*)    Total Protein 9.1 (*)    Albumin 2.2 (*)    Alkaline Phosphatase 188 (*)    Total Bilirubin 1.6 (*)    GFR calc non Af Amer 11 (*)    GFR calc Af Amer 12 (*)    All other components within normal limits  PROTIME-INR - Abnormal; Notable for the following:    Prothrombin Time 23.7 (*)    INR 2.20 (*)    All other components within normal limits  CG4 I-STAT (LACTIC ACID) - Abnormal; Notable for the following:    Lactic Acid, Venous 3.77 (*)    All other components within normal limits  CULTURE, BLOOD (ROUTINE X 2)  CULTURE, BLOOD  (ROUTINE X 2)  URINE CULTURE  TROPONIN I  URINALYSIS, ROUTINE W REFLEX MICROSCOPIC   Imaging Review Dg Chest 2 View  02/03/2013   CLINICAL DATA:  Short of breath.  Dialysis patient  EXAM: CHEST  2 VIEW  COMPARISON:  09/10/2012.  FINDINGS: Marked cardiac enlargement without fluid overload. Negative for edema or effusion. Negative for pneumonia. AICD unchanged in position.  IMPRESSION: Marked cardiac enlargement.  No acute abnormality   Electronically Signed   By: Marlan Palau M.D.   On: 02/03/2013 07:06    EKG Interpretation   None       MDM   1. Sepsis   2. Open wound of penis, initial encounter    Dialysis patient with worsening pain in his left leg with open wounds. No fever at home. Last dialysis 2 days ago. No chest pain or shortness of breath. Macerated wound to penis with recent foreskin suturing.  Patient is febrile with elevated white count and lactic acid. Concern for sepsis. Started on broad-spectrum antibiotics. Source likely previous penile abrasion. Leg wounds do not appear to be overtly infected. There is no scrotal tenderness, perineal tenderness or back tenderness. Do not suspect forneir's gangrene. CT imaging pending at time of sign out to evaluate for any gas collections in pelvis or L leg.  [facility] residents will evaluate patient for admission. Will likely need urology consult.    Date: 02/03/2013  Rate: 90  Rhythm: normal sinus rhythm  QRS Axis: normal  Intervals: normal  ST/T Wave abnormalities:  nonspecific ST/T changes  Conduction Disutrbances:none  Narrative Interpretation:   Old EKG Reviewed: unchanged  CRITICAL CARE Performed by: Glynn OctaveANCOUR, Kevaughn Ewing Total critical care time: 30 Critical care time was exclusive of separately billable procedures and treating other patients. Critical care was necessary to treat or prevent imminent or life-threatening deterioration. Critical care was time spent personally by me on the following activities: development of  treatment plan with patient and/or surrogate as well as nursing, discussions with consultants, evaluation of patient's response to treatment, examination of patient, obtaining history from patient or surrogate, ordering and performing treatments and interventions, ordering and review of laboratory studies, ordering and review of radiographic studies, pulse oximetry and re-evaluation of patient's condition.    Glynn OctaveStephen Sian Joles, MD 02/03/13 [id number]

## 2013-02-03 NOTE — Progress Notes (Signed)
02/03/09 1130 nsg To unit 6E accompanied by NT alert and oriented patient with lower extremity edema wound noted placed on telemetry per order; oriented to unit set up. Continued to monitor.

## 2013-02-03 NOTE — CV Procedure (Signed)
WOC wound consult note Reason for Consult: evaluation of the left thigh wounds. Pt noted to also have dorsal left foot wound.  He does have a palpable pulse.  These wounds are unclear in their etiology, they have been cared for at home by [facility].  He reports multilayer compression in August of last year but I do not see any recent ABI or other LE vascular studies.  He does have bilateral LE edema and skin changes consistent with possible PVD.  However the ulcers are on the inner upper aspect of the left thigh and have associated induration and pain.  They are necrotic and some appear to be full thickness.  But are not open or draining at this time, also the dorsal foot wounds are open but not draining and very dry.  I am concerned about the amount of induration and pain associated with these wounds and therefore recommend vascular surgery to further evaluate these areas. Many of the characteristics of the wounds seem arterial in nature.  Wound type: partial thickness and full thickness skin ulcers of the inner left thigh and dorsal left foot. Measurement: Dorsal foot: 2cm x 1cm x 0.2cm and 0.5cm x 1.0cm x 0.2cm.  Left inner thigh 10cm x 2.5cm x 0.2cm  Wound bed: Dorsal foot: dry, pink, non granular Inner left thigh: dry, yellow/black eschar in some areas, dry pink areas more proximal on the thigh Drainage (amount, consistency, odor) none Periwound:induration in the thigh that extends 10-15 cm both medially and laterally and circumferentially.  Dressing procedure/placement/frequency: Nonadherent dressings for now until further workup for etiology can be determined. Wrap with kerlix and ACE for comfort. Change daily.   Discussed POC with patient and bedside nurse.  Re consult if needed, will not follow at this time. Thanks  Seiji Wiswell [employer], CWOCN [phone])

## 2013-02-03 NOTE — ED Notes (Signed)
Patient is sitting up trying to get an urine sample will check back later

## 2013-02-03 NOTE — Progress Notes (Signed)
Patient is refusing cpap tonight, states he does not wear at home

## 2013-02-03 NOTE — Consult Note (Signed)
[facility] Renal Consultation Note    Indication for Consultation:  Management of ESRD/hemodialysis; anemia, hypertension/volume and secondary hyperparathyroidism  HPI: Adam [name] is a 48 y.o. male with ESRD, morbid obesity,a fib on chronic coumadin, NICM with ICD and chronic left leg/foot wounds being [name] and also started on Ancef for the same 12/27 at his dialysis center. He presented to the ED this am with pain in his left leg so great that he could not go to dialysis.  He had been seen in the ED 3 days prior for penile foreskin laceration that was sutured; Had fever this am, dysuria with urination, no N, V, D. Mild SOB. No CP.Severe leg pain. Appetite poor. He believes wounds on legs from diabetes and HH RN referred was may by family practice MD.  Past Medical History  Diagnosis Date  . Other and unspecified hyperlipidemia   . Insomnia, unspecified   . Obstructive sleep apnea (adult) (pediatric)   . Allergic rhinitis, cause unspecified   . Nonischemic cardiomyopathy     s/p St. Jude ICD  . Unspecified essential hypertension   . Type II or unspecified type diabetes mellitus without mention of complication, not stated as uncontrolled   . CKD (chronic kidney disease) stage 3, GFR 30-59 ml/min   . Esophageal reflux   . Morbid obesity   . Dysuria   . Tobacco use disorder   . Dysrhythmia   . Pacemaker   . Antral ulcer   . Renal azotemia   . Arthritis     Gout w/hyperuricemia  . Morbid obesity   . Shortness of breath   . Automatic implantable cardioverter-defibrillator in situ    Past Surgical History  Procedure Laterality Date  . Cardiac defibrillator placement  2011    St. Jude  . Esophagogastroduodenoscopy  01/03/2011    Procedure: ESOPHAGOGASTRODUODENOSCOPY (EGD);  Surgeon: Vertell NovakJames L Edwards Jr., MD;  [facility];  Service: Endoscopy;  Laterality: N/A;  . Av fistula placement Right 08/31/2012    Procedure: ARTERIOVENOUS (AV) FISTULA  CREATION;  Surgeon: Chuck Hinthristopher S Dickson, MD;  [facility];  Service: Vascular;  Laterality: Right;  . Insertion of dialysis catheter Right 08/31/2012    Procedure: INSERTION OF DIALYSIS CATHETER-Right Internal Jugular Placement;  Surgeon: Chuck Hinthristopher S Dickson, MD;  [facility];  Service: Vascular;  Laterality: Right;   Family History  Problem Relation Age of Onset  . Diabetes Mother   . Microcephaly Father   . Lung cancer Father    Social History:  reports that he has been smoking Cigarettes.  He has been smoking about 0.25 packs per day. He has never used smokeless tobacco. He reports that he does not drink alcohol or use illicit drugs. Allergies  Allergen Reactions  . Argatroban Other (See Comments)    Ventricular tachycardia  . Ace Inhibitors     Acute renal failure with multiple trials  . Heparin     Thrombcytopenia. SRA positive. See miscellaneous test (SRA results)   Prior to Admission medications   Medication Sig Start Date End Date Taking? Authorizing Provider  acetaminophen (TYLENOL) 500 MG tablet Take 500 mg by mouth every 6 (six) hours as needed.   Yes Historical Provider, MD  amiodarone (PACERONE) 200 MG tablet Take 200 mg by mouth daily.   Yes Historical Provider, MD  colchicine 0.6 MG tablet Take 1 tablet (0.6 mg total) by mouth See admin instructions. Takes every three days. 01/10/13 01/10/14 Yes Judie BonusElizabeth A Kollar, MD  diclofenac sodium (VOLTAREN) 1 % GEL Apply 2 g topically 4 (four) times daily. 01/10/13  Yes Judie Bonus, MD  esomeprazole (NEXIUM) 20 MG capsule Take 20 mg by mouth daily at 12 noon. TAKE ONE CAPSULE EVERY DAY BEFORE BREAKFAST 01/10/13  Yes Judie Bonus, MD  Febuxostat (ULORIC) 80 MG TABS Take 1 tablet (80 mg total) by mouth daily. 12/30/12  Yes Courtney Paris, MD  fluticasone (FLONASE) 50 MCG/ACT nasal spray Place 1 spray into both nostrils daily as needed for allergies. For allergies 01/25/13  Yes Evelena Peat, DO  Insulin Glargine  (LANTUS SOLOSTAR) 100 UNIT/ML Solostar Pen Inject 45 Units into the skin daily at 10 pm.    Yes Historical Provider, MD  loratadine (CLARITIN) 10 MG tablet Take 10 mg by mouth daily as needed for allergies. 01/10/13  Yes Judie Bonus, MD  LORazepam (ATIVAN) 0.5 MG tablet Take 0.5 mg by mouth daily. TAKE 1 TABLET TWICE A DAY AS NEEDED 01/10/13  Yes Judie Bonus, MD  traMADol (ULTRAM) 50 MG tablet Take 100 mg by mouth 2 (two) times daily.   Yes Historical Provider, MD  warfarin (COUMADIN) 2.5 MG tablet Take 2.5 mg by mouth daily. Takes 1 and half tab every day except on Monday. Monday takes just 2.5 mg.   Yes Historical Provider, MD   Current Facility-Administered Medications  Medication Dose Route Frequency Provider Last Rate Last Dose  . 0.9 %  sodium chloride infusion  20 mL Intravenous Continuous Glynn Octave, MD 10 mL/hr at 02/03/13 0730 500 mL at 02/03/13 0730  . 0.9 %  sodium chloride infusion  250 mL Intravenous PRN Lorretta Harp, MD      . acetaminophen (TYLENOL) tablet 500 mg  500 mg Oral Q6H PRN Lorretta Harp, MD      . amiodarone (PACERONE) tablet 200 mg  200 mg Oral Daily Lorretta Harp, MD      . amoxicillin-clavulanate (AUGMENTIN) 500-125 MG per tablet 500 mg  1 tablet Oral TID Lorretta Harp, MD   500 mg at 02/03/13 1312  . colchicine tablet 0.6 mg  0.6 mg Oral Q3 days Lorretta Harp, MD      . darbepoetin [facility]) injection 200 mcg  200 mcg Intravenous Q Thu-HD Sheffield Slider, PA-C      . diclofenac sodium (VOLTAREN) 1 % transdermal gel 2 g  2 g Topical QID Lorretta Harp, MD      . doxercalciferol (HECTOROL) injection 6 mcg  6 mcg Intravenous Q T,Th,Sa-HD Sheffield Slider, PA-C      . Febuxostat TABS 80 mg  1 tablet Oral Daily Lorretta Harp, MD      . ferric gluconate (NULECIT) 125 mg in sodium chloride 0.9 % 100 mL IVPB  125 mg Intravenous Q T,Th,Sa-HD Sheffield Slider, PA-C      . fluconazole (DIFLUCAN) tablet 150 mg  150 mg Oral Once Lorretta Harp, MD      . fluticasone (FLONASE) 50 MCG/ACT  nasal spray 1 spray  1 spray Each Nare Daily PRN Lorretta Harp, MD      . insulin aspart (novoLOG) injection 0-9 Units  0-9 Units Subcutaneous Q4H Lorretta Harp, MD      . insulin glargine (LANTUS) injection 45 Units  45 Units Subcutaneous QHS Hessie Diener Markle, RPH      . loratadine (CLARITIN) tablet 10 mg  10 mg Oral Daily PRN Lorretta Harp, MD      . LORazepam (ATIVAN) tablet 0.5 mg  0.5 mg  Oral Daily Lorretta Harp, MD      . pantoprazole (PROTONIX) EC tablet 40 mg  40 mg Oral QAC breakfast Lorretta Harp, MD      . sodium chloride 0.9 % injection 3 mL  3 mL Intravenous Q12H Lorretta Harp, MD      . sodium chloride 0.9 % injection 3 mL  3 mL Intravenous PRN Lorretta Harp, MD      . traMADol Janean Sark) tablet 100 mg  100 mg Oral BID Lorretta Harp, MD   100 mg at 02/03/13 0936  . warfarin (COUMADIN) tablet 3.75 mg  3.75 mg Oral ONCE-1800 Hilario Quarry [place], [facility]      . Warfarin - Pharmacist Dosing Inpatient   Does not apply q1800 Lavonia Dana, [facility]       Labs: Basic Metabolic Panel:  Recent Labs Lab 02/03/13 0545  NA 134*  K 4.8  CL 90*  CO2 25  GLUCOSE 114*  BUN 30*  CREATININE 5.75*  CALCIUM 9.3   Liver Function Tests:  Recent Labs Lab 02/03/13 0545  AST 9  ALT <5  ALKPHOS 188*  BILITOT 1.6*  PROT 9.1*  ALBUMIN 2.2*  CBC:  Recent Labs Lab 02/03/13 0545  WBC 15.9*  NEUTROABS 12.6*  HGB 8.3*  HCT 27.0*  MCV 90.6  PLT 478*   Cardiac Enzymes:  Recent Labs Lab 02/03/13 0545  TROPONINI <0.30   CBG:  Recent Labs Lab 01/30/13 0919 01/30/13 1118 02/03/13 1052 02/03/13 1127  GLUCAP 404* 328* 96 102*   Lab Results  Component Value Date   INR 2.20* 02/03/2013   INR 2.96* 01/30/2013   INR 3.60 01/10/2013    Studies/Results: Dg Chest 2 View  02/03/2013   CLINICAL DATA:  Short of breath.  Dialysis patient  EXAM: CHEST  2 VIEW  COMPARISON:  09/10/2012.  FINDINGS: Marked cardiac enlargement without fluid overload. Negative for edema or effusion. Negative for pneumonia. AICD unchanged in  position.  IMPRESSION: Marked cardiac enlargement.  No acute abnormality   Electronically Signed   By: Marlan Palau M.D.   On: 02/03/2013 07:06   Ct Pelvis Wo Contrast  02/03/2013   CLINICAL DATA:  Wounds on the left thigh and dorsum of the left foot. Blistering left leg. Elevated white blood cell count.  EXAM: CT TIBIA FIBULA LEFT WITHOUT CONTRAST; CT OF THE LEFT FEMUR WITHOUT CONTRAST; CT PELVIS WITHOUT CONTRAST  TECHNIQUE: Multidetector CT imaging of the pelvis, left femur and left lower leg was performed following the standard protocol without intravenous contrast.  COMPARISON:  None.  FINDINGS: PELVIS:  There is mild infiltration of subcutaneous fat about the pelvis. No focal fluid collection is identified and there is no fluid collection within the pelvis itself. There is no lymphadenopathy. No bony destructive change or other bony abnormality is identified. No hip joint effusion is seen.  LEFT THIGH:  The right thigh is partially visualized on the axial and coronal images. Cutaneous tissues appear thickened bilaterally. There is also infiltration of subcutaneous fat bilaterally which appears fairly symmetric. No focal fluid collection is identified. Fat planes within muscle are preserved. Multiple varicosities are noted. No muscle or tendon tear is seen. No radiopaque foreign body is identified. There is atherosclerotic vascular disease. No bony destructive change or other focal bony lesion is identified.  LEFT LOWER LEG:  Cutaneous tissues appear thickened about the medial margin of the left lower leg where there appear to be blisters at the skin surface. There is some infiltration of subcutaneous  fat about the lower legs bilaterally, worse on the left. Fat planes within muscle are preserved. No focal fluid collection is seen in the subcutaneous or muscular tissues. No bony destructive change or other focal bony lesion is present. All visualized muscles and tendons appear intact.  IMPRESSION: Findings  compatible cellulitis and/or dependent change about both legs and the pelvis. Blistering along the skin surface is seen in the left lower leg. No abscess is identified and there is no CT evidence of osteomyelitis.  Atherosclerosis.   Electronically Signed   By: Drusilla Kanner M.D.   On: 02/03/2013 08:30   Ct Femur Left Wo Contrast  02/03/2013   CLINICAL DATA:  Wounds on the left thigh and dorsum of the left foot. Blistering left leg. Elevated white blood cell count.  EXAM: CT TIBIA FIBULA LEFT WITHOUT CONTRAST; CT OF THE LEFT FEMUR WITHOUT CONTRAST; CT PELVIS WITHOUT CONTRAST  TECHNIQUE: Multidetector CT imaging of the pelvis, left femur and left lower leg was performed following the standard protocol without intravenous contrast.  COMPARISON:  None.  FINDINGS: PELVIS:  There is mild infiltration of subcutaneous fat about the pelvis. No focal fluid collection is identified and there is no fluid collection within the pelvis itself. There is no lymphadenopathy. No bony destructive change or other bony abnormality is identified. No hip joint effusion is seen.  LEFT THIGH:  The right thigh is partially visualized on the axial and coronal images. Cutaneous tissues appear thickened bilaterally. There is also infiltration of subcutaneous fat bilaterally which appears fairly symmetric. No focal fluid collection is identified. Fat planes within muscle are preserved. Multiple varicosities are noted. No muscle or tendon tear is seen. No radiopaque foreign body is identified. There is atherosclerotic vascular disease. No bony destructive change or other focal bony lesion is identified.  LEFT LOWER LEG:  Cutaneous tissues appear thickened about the medial margin of the left lower leg where there appear to be blisters at the skin surface. There is some infiltration of subcutaneous fat about the lower legs bilaterally, worse on the left. Fat planes within muscle are preserved. No focal fluid collection is seen in the  subcutaneous or muscular tissues. No bony destructive change or other focal bony lesion is present. All visualized muscles and tendons appear intact.  IMPRESSION: Findings compatible cellulitis and/or dependent change about both legs and the pelvis. Blistering along the skin surface is seen in the left lower leg. No abscess is identified and there is no CT evidence of osteomyelitis.  Atherosclerosis.   Electronically Signed   By: Drusilla Kanner M.D.   On: 02/03/2013 08:30   Ct Tibia Fibula Left Wo Contrast  02/03/2013   CLINICAL DATA:  Wounds on the left thigh and dorsum of the left foot. Blistering left leg. Elevated white blood cell count.  EXAM: CT TIBIA FIBULA LEFT WITHOUT CONTRAST; CT OF THE LEFT FEMUR WITHOUT CONTRAST; CT PELVIS WITHOUT CONTRAST  TECHNIQUE: Multidetector CT imaging of the pelvis, left femur and left lower leg was performed following the standard protocol without intravenous contrast.  COMPARISON:  None.  FINDINGS: PELVIS:  There is mild infiltration of subcutaneous fat about the pelvis. No focal fluid collection is identified and there is no fluid collection within the pelvis itself. There is no lymphadenopathy. No bony destructive change or other bony abnormality is identified. No hip joint effusion is seen.  LEFT THIGH:  The right thigh is partially visualized on the axial and coronal images. Cutaneous tissues appear thickened bilaterally. There is  also infiltration of subcutaneous fat bilaterally which appears fairly symmetric. No focal fluid collection is identified. Fat planes within muscle are preserved. Multiple varicosities are noted. No muscle or tendon tear is seen. No radiopaque foreign body is identified. There is atherosclerotic vascular disease. No bony destructive change or other focal bony lesion is identified.  LEFT LOWER LEG:  Cutaneous tissues appear thickened about the medial margin of the left lower leg where there appear to be blisters at the skin surface. There is  some infiltration of subcutaneous fat about the lower legs bilaterally, worse on the left. Fat planes within muscle are preserved. No focal fluid collection is seen in the subcutaneous or muscular tissues. No bony destructive change or other focal bony lesion is present. All visualized muscles and tendons appear intact.  IMPRESSION: Findings compatible cellulitis and/or dependent change about both legs and the pelvis. Blistering along the skin surface is seen in the left lower leg. No abscess is identified and there is no CT evidence of osteomyelitis.  Atherosclerosis.   Electronically Signed   By: Drusilla Kanner M.D.   On: 02/03/2013 08:30   ROS: As per HPI otherwise neg  Physical Exam: Filed Vitals:   02/03/13 0730 02/03/13 0815 02/03/13 0911 02/03/13 1030  BP: 105/66 90/38 84/64  99/52  Pulse:  124 64 120  Temp:      TempSrc:      Resp: 30 23 22 26   SpO2:  93% 98% 95%     General: Well developed, morbidly obese appears unvomfortable Head: Normocephalic, atraumatic, sclera non-icteric, mucus membranes are moist Neck: Supple. Neck veins prominent Lungs: Clear bilaterally to auscultation without wheezes, rales, or rhonchi. BS dim due to body habitus Heart: tachycardic rate 120s Abdomen:  Obese soft, non-tender,. Buttocks/thighs - massive with dependent edema Lower extremities: +++edema left thigh wrapped; some blisters on left LE and open blisters on foot; skin on LE very dark  Neuro: Alert and oriented X 3. Moves all extremities spontaneously. Psych:  Responds to questions appropriately with a normal affect. Dialysis Access: right lower AVF active + bruit with skin discoloration since surgery  Dialysis Orders: [facility]75 hours Optiflux 180 550/A 1.5 -4K 2.25 Ca no var NA or profile EDW 143.5 Access -right lower AVF; NO HEPARIN Epo 22,000 no Fe Hectorol 6 Recent labs:  Hgb 8.9 12/29, 8.6 11/25 - on 22,000 Epo over a month; tsat 13% 12/18 got venofer 12/29 - ferritin 62 in  October; iPTH 157 12/18 improved with normal Ca and P  Assessment/Plan: 1. Left thigh and foot wounds unclear etiology with cellulitis - started on Ancef at dialysis 12/27 (per records and my d/w with charge RN today) - seen by Wound Care RN who has requested vascular evaluation; Vanc Zosyn  And Augmentin initiated here 2. Penile wound - antibiotics as above with recent repair of lac of foreskin; cultures ordered 3. 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 BP/volume  - usual gains 3 - 5 kg; BP generally 90s - 100s - gets to EDW; has excess volume and I think low BP and maybe low Hgb interfere with volume removal;  Would try for aggressive volume removal while here with serial HD and extra treatment Friday for ultrafiltration. Pt unable  to stand for weights Increase goal today to 5 liters. 5. Anemia  - chronically low Hgb in the 8s for several months - on 22,000 units Epo without response - giving Aranesp 200 here;  Fe low - 13% sat - last venofer given 12/29 - will recheck Fe studies pre HD today including ferritin b/c I would excpect an   increase due to thigh wounds; have already put in order to replete Fe but can be changed if needed; likely has EPO resistance due to inflammation 6. Metabolic bone disease -  Controlled with current hectorol and add renvela - takes 2 ac (though not on home med list) 7. 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 Atrial fibrillation - on amio and coumadin - INR therapeutic 10. 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 Hx gout - on cochicine; also on Febuoxostat - has not been studied in pts with ESRD 12. DM - Lantus + SSI per primary 13. NICM - last Echo was 12/2011 with EF 25%; massive CM on CXR -  may be worse and contributory to low BP and surely edema 14. OSA - uses nocturnal O2 but not CPAP  Sheffield Slider, PA-C [employer] [phone] 02/03/2013, 1:51 PM

## 2013-02-03 NOTE — Consult Note (Signed)
I have personally seen and examined this patient and agree with the assessment/plan as outlined above by [facility] PA. Patient here with some changes in metal status probably associated with sepsis from Cellulitis v/s UTI with balanitis. Will continue provision of HD as we investigate further for etiologies of encephalopathy. Lareen Mullings K.,MD 02/03/2013 3:31 PM

## 2013-02-03 NOTE — Procedures (Signed)
Patient seen on Hemodialysis. QB 500, UF goal 4.5L Treatment adjusted as needed.  Zetta Bills MD [facility]. Office # [phone] Pager # [phone] 3:34 PM

## 2013-02-03 NOTE — ED Notes (Signed)
Admitting md at bedside to eval pt 

## 2013-02-03 NOTE — H&P (Signed)
Hospital Admission Note Date: 02/03/2013  Patient name: Adam [name] Medical record number: [id number] Date of birth: [birth year] Age: 48 y.o. Gender: male PCP: Evelena Peat, DO  Medical Service: IMTS  Attending physician: Dr. Criselda Peaches  Internal Medicine Teaching Service Contact Information  Weekday Hours (7AM-5PM):   1st contact:  Dr. Darci Needle               Pgr:  [phone] 2nd contact:  Dr. Darden Palmer    pgr:  [phone]  ** If no return call within 15 minutes (after trying both pagers listed above), please call after hours pagers.    After 5 pm or weekends: 1st Contact: Pager: [phone] 2nd Contact: Pager: [phone]  Chief Complaint: penile discharge and pain, leg pain.   History of Present Illness:  Patient is 48 yo man with PMH of ESRD on HD (T/thu/S, CHF (EF 25%), DM-II, sleep apnea, gout, A fib on coumadin (INR 2.2), who presents with penis pain and discharge as well as leg pain.    Patient reports that he started having penile discharge and pain about 2 weeks ago. The discharge was clear initially. He also has itches. He scratched and caused small skin laceration in last Sunday. He came to the ED and had suture done in ED. After he went home, his penile discharge and pain has been progressively getting worse. He has not been sexually active for more than 6 months. He does not feel hot and no chills, but his temperature was 100.3 in Ed. He was found to have leukocytosis with WBC 15.9 and SBP at 80s in ED (patient's BP has been always running low).   Patient has chronic leg swelling and left foot ulcer. The foot ulcer has been treated by home health nurse. His left foot ulcer has been improving. Regarding his leg edema, he feels it is getting worse. He has pain in his legs, worse on the left upper thigh. He developed new skin ulcers over the left leg which is medial to the left knee. The ulcer is painful per patient.   Patient reports that he has been taking all his medications  regularly. His shortness of breath is at his baseline. BW is stable.  ROS:  Denies fever, chills, fatigue, headaches, cough, chest pain, abdominal pain, diarrhea, constipation, dysuria, urgency, frequency, hematuria.  Denies any nausea or vomiting.   Meds: Current Outpatient Rx  Name  Route  Sig  Dispense  Refill  . acetaminophen (TYLENOL) 500 MG tablet   Oral   Take 500 mg by mouth every 6 (six) hours as needed.         Marland Kitchen amiodarone (PACERONE) 200 MG tablet   Oral   Take 200 mg by mouth daily.         . colchicine 0.6 MG tablet   Oral   Take 1 tablet (0.6 mg total) by mouth See admin instructions. Takes every three days.   15 tablet   2   . diclofenac sodium (VOLTAREN) 1 % GEL   Topical   Apply 2 g topically 4 (four) times daily.   1 Tube   2   . esomeprazole (NEXIUM) 20 MG capsule   Oral   Take 20 mg by mouth daily at 12 noon. TAKE ONE CAPSULE EVERY DAY BEFORE BREAKFAST         . Febuxostat (ULORIC) 80 MG TABS   Oral   Take 1 tablet (80 mg total) by mouth daily.   30 tablet  5   . fluticasone (FLONASE) 50 MCG/ACT nasal spray   Each Nare   Place 1 spray into both nostrils daily as needed for allergies. For allergies   16 g   2   . Insulin Glargine (LANTUS SOLOSTAR) 100 UNIT/ML Solostar Pen   Subcutaneous   Inject 45 Units into the skin daily at 10 pm.          . loratadine (CLARITIN) 10 MG tablet   Oral   Take 10 mg by mouth daily as needed for allergies.         Marland Kitchen LORazepam (ATIVAN) 0.5 MG tablet   Oral   Take 0.5 mg by mouth daily. TAKE 1 TABLET TWICE A DAY AS NEEDED         . traMADol (ULTRAM) 50 MG tablet   Oral   Take 100 mg by mouth 2 (two) times daily.         Marland Kitchen warfarin (COUMADIN) 2.5 MG tablet   Oral   Take 2.5 mg by mouth daily. Takes 1 and half tab every day except on Monday. Monday takes just 2.5 mg.           Allergies: Allergies as of 02/03/2013 - Review Complete 02/03/2013  Allergen Reaction Noted  . Argatroban Other  (See Comments) 09/23/2012  . Ace inhibitors  06/24/2012  . Heparin  09/17/2012   Past Medical History  Diagnosis Date  . Other and unspecified hyperlipidemia   . Insomnia, unspecified   . Obstructive sleep apnea (adult) (pediatric)   . Allergic rhinitis, cause unspecified   . Nonischemic cardiomyopathy     s/p St. Jude ICD  . Unspecified essential hypertension   . Type II or unspecified type diabetes mellitus without mention of complication, not stated as uncontrolled   . CKD (chronic kidney disease) stage 3, GFR 30-59 ml/min   . Esophageal reflux   . Morbid obesity   . Dysuria   . Tobacco use disorder   . Dysrhythmia   . Pacemaker   . Antral ulcer   . Renal azotemia   . Arthritis     Gout w/hyperuricemia  . Morbid obesity    Past Surgical History  Procedure Laterality Date  . Cardiac defibrillator placement  2011    St. Jude  . Esophagogastroduodenoscopy  01/03/2011    Procedure: ESOPHAGOGASTRODUODENOSCOPY (EGD);  Surgeon: Vertell Novak., MD;  Location: [facility] ENDOSCOPY;  Service: Endoscopy;  Laterality: N/A;  . Av fistula placement Right 08/31/2012    Procedure: ARTERIOVENOUS (AV) FISTULA CREATION;  Surgeon: Chuck Hint, MD;  Location: [facility] OR;  Service: Vascular;  Laterality: Right;  . Insertion of dialysis catheter Right 08/31/2012    Procedure: INSERTION OF DIALYSIS CATHETER-Right Internal Jugular Placement;  Surgeon: Chuck Hint, MD;  Location: [facility] OR;  Service: Vascular;  Laterality: Right;   Family History  Problem Relation Age of Onset  . Diabetes Mother   . Microcephaly Father   . Lung cancer Father    History   Social History  . Marital Status: Single    Spouse Name: N/A    Number of Children: N/A  . Years of Education: N/A   Occupational History  . Not on file.   Social History Main Topics  . Smoking status: Current Every Day Smoker -- 0.25 packs/day    Types: Cigarettes  . Smokeless tobacco: Never Used     Comment:  pt states that  he will quit on his own and is in the process  of quiting  . Alcohol Use: No     Comment: No alcohol x 1 month.  . Drug Use: No  . Sexual Activity: Not on file   Other Topics Concern  . Not on file   Social History Narrative  . No narrative on file    Review of Systems: Full 14-point review of systems otherwise negative except as noted above in HPI.  Physical Exam:   Filed Vitals:   02/03/13 0600 02/03/13 0608 02/03/13 0618 02/03/13 0730  BP: 86/47   105/66  Pulse: 125     Temp:  98.7 F (37.1 C) 100.3 F (37.9 C)   TempSrc:  Oral Rectal   Resp: 27   30  SpO2: 94%      General: Not in acute distress HEENT: PERRL, EOMI, no scleral icterus, No JVD or bruit Cardiac: S1/S2, RRR, No murmurs, gallops or rubs Pulm: Good air movement bilaterally. Clear to auscultation bilaterally. No rales, wheezing, rhonchi or rubs. Abd: Soft, nondistended, nontender, no rebound pain, no organomegaly, BS present Ext: 1+ pitting edema in lower extremities bilaterally. There are induration over skin of medial thigh bilaterally, which is tender to palpation. There a few small skin ulcers over the medial side of left knee, no discharge. There are a few ulcers over the left foot which is dressed up, no drainage.  Musculoskeletal: No joint deformities, erythema, or stiffness, ROM full Skin: no rashes.  Neuro: Alert and oriented X3, cranial nerves II-XII grossly intact, muscle strength 5/5 in all extremeties, sensation to light touch intact. Brachial reflex 2+ bilaterally. Knee reflex 1+ bilaterally. Negative Babinski's sign. Normal finger to nose test. Psych: Patient is not psychotic, no suicidal or hemocidal ideation. Genital: Foreskin is partially retracted and macerated. There is yellow colored discharge around glans, but not from meatus. Patient reports that he had sutures done over foreskin in ED, but I could not located it. Testicles nontender   Lab results: Basic Metabolic Panel:  Recent Labs   02/03/13 0545  NA 134*  K 4.8  CL 90*  CO2 25  GLUCOSE 114*  BUN 30*  CREATININE 5.75*  CALCIUM 9.3   Liver Function Tests:  Recent Labs  02/03/13 0545  AST 9  ALT <5  ALKPHOS 188*  BILITOT 1.6*  PROT 9.1*  ALBUMIN 2.2*   No results found for this basename: LIPASE, AMYLASE,  in the last 72 hours No results found for this basename: AMMONIA,  in the last 72 hours CBC:  Recent Labs  02/03/13 0545  WBC 15.9*  NEUTROABS 12.6*  HGB 8.3*  HCT 27.0*  MCV 90.6  PLT 478*   Cardiac Enzymes:  Recent Labs  02/03/13 0545  TROPONINI <0.30   BNP: No results found for this basename: PROBNP,  in the last 72 hours D-Dimer: No results found for this basename: DDIMER,  in the last 72 hours CBG: No results found for this basename: GLUCAP,  in the last 72 hours Hemoglobin A1C: No results found for this basename: HGBA1C,  in the last 72 hours Fasting Lipid Panel: No results found for this basename: CHOL, HDL, LDLCALC, TRIG, CHOLHDL, LDLDIRECT,  in the last 72 hours Thyroid Function Tests: No results found for this basename: TSH, T4TOTAL, FREET4, T3FREE, THYROIDAB,  in the last 72 hours Anemia Panel: No results found for this basename: VITAMINB12, FOLATE, FERRITIN, TIBC, IRON, RETICCTPCT,  in the last 72 hours Coagulation:  Recent Labs  02/03/13 0545  LABPROT 23.7*  INR 2.20*   Urine Drug  Screen: Drugs of Abuse     Component Value Date/Time   LABOPIA NEG 03/17/2006 1455   COCAINSCRNUR NEG 03/17/2006 1455   LABBENZ NEG 03/17/2006 1455   AMPHETMU NEG 03/17/2006 1455    Alcohol Level: No results found for this basename: ETH,  in the last 72 hours Urinalysis: No results found for this basename: COLORURINE, APPERANCEUR, LABSPEC, PHURINE, GLUCOSEU, HGBUR, BILIRUBINUR, KETONESUR, PROTEINUR, UROBILINOGEN, NITRITE, LEUKOCYTESUR,  in the last 72 hours Misc. Labs:  Imaging results:  Dg Chest 2 View  02/03/2013   CLINICAL DATA:  Short of breath.  Dialysis patient  EXAM: CHEST   2 VIEW  COMPARISON:  09/10/2012.  FINDINGS: Marked cardiac enlargement without fluid overload. Negative for edema or effusion. Negative for pneumonia. AICD unchanged in position.  IMPRESSION: Marked cardiac enlargement.  No acute abnormality   Electronically Signed   By: Marlan Palau M.D.   On: 02/03/2013 07:06    Other results:  Assessment & Plan by Problem: Principal Problem:   Left leg pain Active Problems:   DYSLIPIDEMIA   OBSTRUCTIVE SLEEP APNEA   HYPERTENSION   GERD   ICD (implantable cardioverter-defibrillator), single, in situ; St. Jude   Normocytic anemia   End stage renal disease   Hypotension, unspecified   Heparin allergy   Atrial fibrillation   Long term (current) use of anticoagulants  # Balanitis and sepsis: Patient's penile pain is most likely caused by balanitis. It is likely due to poor hygiene. Other differential diagnoses include STD, such as GC/Chlamydia infection and urarthritis. Currently the patient has fever, leukocytosis, tachycardia and hypotension, meets the criteria for sepsis. Patient's blood pressure has always been running low at home. His low bp may not be from current infection. Patient received IV vancomycin, Zosyn and NS bolus (500 mL) in ED. Currently he is hemodynamically stable.   -will admit to tele bed given his significant cardiac issue -will treat empirically with one dose of fluconazole 150 mg X 1, and Augmentin for possible anaerobic coverage. -GC/Chlamydia problem, Wet prep, urinalysis, urine culture, blood culture.  # ESRD onHD (T/Thu/S): cre 5.75 and BUN 30. He did not missed dialysis. -will consult to renal for HD today.  #: A Fib: HR is 64 to 124 on admission. He is on amiodarone 200 mg daily at home and Coumadin. INR is 2.2 on admission.  -will continue home meds  #:  DM-II: A1c 4.9 on 12/13/12, which may not be accurate due to dialysis. Patient is taking Lantus 45 unit at bedtime daily. -continue Lantus 45 U daily at bet  time -SSI   # CHF with EF of 25%: stable. BW was 318 on 09/25/12-->299 yesterday per patient. ICD in placed. Patient does not have worsening shortness of breath. He looks okay with her volume status. He used to take metoprolol which was discontinued due to low blood pressure.  -will observe closely.  #: OSA:  - CPAP  #: Anxiety:  - Ativan 0.5 mg prn  #: gout (left Knee Pain): stable. -continue home meds: tylenold,Tramadol, Voltaren Gel, Febuxostat, and colchicine  #: Skin ulcer:  started on Ancef for foot skin ulcer at dialysis 12/27. It has been treated by Wound Care RN. The foot ulcer is improving. His skin ulcers over the left thigh does not look like infected. -will be on augmentine -consult to wound care  #: Skin induration in the upper medial thighs: looks likely chronic issue. Ct scan done in ED showed no evidence for abscess and osteomyelitis. It is likely  due to chronic edema.  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 Anticipated discharge in approximately 1 to 2 day(s).   The patient does have a current PCP Andrey Campanile, Alex, DO), therefore is requiring OPC follow-up after discharge.   The patient does not have transportation limitations that hinder transportation to clinic appointments.  Signed:  Lorretta Harp, MD PGY3, Internal Medicine Teaching Service Pager: [phone]  02/03/2013, 8:14 AM

## 2013-02-03 NOTE — ED Notes (Signed)
EMS called to home for pain and weakness that started 4 days ago in his left leg. Pt. Has a large sore on left thigh and on top of his left foot that he is being treated for by Home Health care. He has had some more blisters appearing in the last few days on his thigh. States blood sugars have been running high recently but one this am was 161.  States SBP has been running in the 90's. States that they have been using HD to get fluid off his legs. Had HD on T-Th-S. Was suppose to be at HD this am at 0500 but states he could not walk because of the pain in his leg so he came here instead.

## 2013-02-04 ENCOUNTER — Encounter (HOSPITAL_COMMUNITY): Payer: Self-pay | Admitting: Anesthesiology

## 2013-02-04 ENCOUNTER — Encounter (HOSPITAL_COMMUNITY): Payer: Medicaid Other | Admitting: Certified Registered"

## 2013-02-04 ENCOUNTER — Observation Stay (HOSPITAL_COMMUNITY): Payer: Medicaid Other | Admitting: Certified Registered"

## 2013-02-04 ENCOUNTER — Encounter (HOSPITAL_COMMUNITY)
Admission: EM | Disposition: A | Discharge status: Rehabilitation Facility | Payer: Self-pay | Source: Home / Self Care | Attending: Internal Medicine

## 2013-02-04 DIAGNOSIS — A419 Sepsis, unspecified organism: Principal | ICD-10-CM

## 2013-02-04 DIAGNOSIS — N476 Balanoposthitis: Secondary | ICD-10-CM

## 2013-02-04 DIAGNOSIS — M7989 Other specified soft tissue disorders: Secondary | ICD-10-CM

## 2013-02-04 DIAGNOSIS — L97109 Non-pressure chronic ulcer of unspecified thigh with unspecified severity: Secondary | ICD-10-CM

## 2013-02-04 DIAGNOSIS — E1165 Type 2 diabetes mellitus with hyperglycemia: Secondary | ICD-10-CM

## 2013-02-04 DIAGNOSIS — E1129 Type 2 diabetes mellitus with other diabetic kidney complication: Secondary | ICD-10-CM

## 2013-02-04 DIAGNOSIS — I509 Heart failure, unspecified: Secondary | ICD-10-CM

## 2013-02-04 DIAGNOSIS — N186 End stage renal disease: Secondary | ICD-10-CM

## 2013-02-04 DIAGNOSIS — M79609 Pain in unspecified limb: Secondary | ICD-10-CM

## 2013-02-04 HISTORY — PX: URETHROTOMY: SHX1083

## 2013-02-04 HISTORY — PX: IRRIGATION AND DEBRIDEMENT ABSCESS: SHX5252

## 2013-02-04 HISTORY — PX: GROIN DEBRIDEMENT: SHX5159

## 2013-02-04 HISTORY — PX: CIRCUMCISION: SHX1350

## 2013-02-04 LAB — GLUCOSE, CAPILLARY
GLUCOSE-CAPILLARY: 84 mg/dL (ref 70–99)
GLUCOSE-CAPILLARY: 114 mg/dL — AB (ref 70–99)
Glucose-Capillary: 132 mg/dL — ABNORMAL HIGH (ref 70–99)
Glucose-Capillary: 118 mg/dL — ABNORMAL HIGH (ref 70–99)

## 2013-02-04 LAB — CBC
HEMATOCRIT: 22.4 % — AB (ref 39.0–52.0)
HEMOGLOBIN: 7.2 g/dL — AB (ref 13.0–17.0)
MCH: 28.8 pg (ref 26.0–34.0)
MCHC: 32.1 g/dL (ref 30.0–36.0)
MCV: 89.6 fL (ref 78.0–100.0)
Platelets: 403 10*3/uL — ABNORMAL HIGH (ref 150–400)
RBC: 2.5 MIL/uL — ABNORMAL LOW (ref 4.22–5.81)
RDW: 16 % — AB (ref 11.5–15.5)
WBC: 14.8 10*3/uL — ABNORMAL HIGH (ref 4.0–10.5)

## 2013-02-04 LAB — IRON AND TIBC
Iron: 56 ug/dL (ref 42–135)
Saturation Ratios: 30 % (ref 20–55)
TIBC: 189 ug/dL — ABNORMAL LOW (ref 215–435)
UIBC: 133 ug/dL (ref 125–400)

## 2013-02-04 LAB — HIV ANTIBODY (ROUTINE TESTING W REFLEX): HIV: NONREACTIVE

## 2013-02-04 SURGERY — CIRCUMCISION, ADULT
Anesthesia: General | Site: Penis

## 2013-02-04 MED ORDER — CEFAZOLIN SODIUM 1-5 GM-% IV SOLN
INTRAVENOUS | Status: AC
  Filled 2013-02-04: qty 50

## 2013-02-04 MED ORDER — OXYCODONE HCL 5 MG/5ML PO SOLN: 5.0000 mg | Freq: Once | ORAL | Status: AC | PRN

## 2013-02-04 MED ORDER — ALBUMIN HUMAN 25 % IV SOLN
12.5000 g | Freq: Once | INTRAVENOUS | Status: AC
  Filled 2013-02-04: qty 50

## 2013-02-04 MED ORDER — DEXTROSE 5 % IV SOLN
3.0000 g | Freq: Once | INTRAVENOUS | Status: DC
  Filled 2013-02-04 (×2): qty 3000

## 2013-02-04 MED ORDER — PROPOFOL 10 MG/ML IV BOLUS
INTRAVENOUS | Status: DC | PRN
  Administered 2013-02-04: 40 mg via INTRAVENOUS

## 2013-02-04 MED ORDER — PROMETHAZINE HCL 25 MG/ML IJ SOLN: 6.2500 mg | INTRAMUSCULAR | Status: DC | PRN

## 2013-02-04 MED ORDER — HYDROMORPHONE HCL PF 1 MG/ML IJ SOLN
INTRAMUSCULAR | Status: AC
  Filled 2013-02-04: qty 1

## 2013-02-04 MED ORDER — CEFAZOLIN SODIUM-DEXTROSE 2-3 GM-% IV SOLR
INTRAVENOUS | Status: AC
  Filled 2013-02-04: qty 50

## 2013-02-04 MED ORDER — SODIUM CHLORIDE 0.9 % IV SOLN
INTRAVENOUS | Status: DC | PRN
  Administered 2013-02-04: 21:00:00 via INTRAVENOUS

## 2013-02-04 MED ORDER — OXYCODONE HCL 5 MG PO TABS: 5.0000 mg | ORAL_TABLET | Freq: Once | ORAL | Status: AC | PRN

## 2013-02-04 MED ORDER — DEXTROSE 5 % IV SOLN
10.0000 mg | INTRAVENOUS | Status: DC | PRN
  Administered 2013-02-04: 20 ug/min via INTRAVENOUS

## 2013-02-04 MED ORDER — HYDROMORPHONE HCL PF 1 MG/ML IJ SOLN
0.2500 mg | INTRAMUSCULAR | Status: DC | PRN
  Administered 2013-02-04 (×3): 0.5 mg via INTRAVENOUS

## 2013-02-04 MED ORDER — ALBUMIN HUMAN 25 % IV SOLN
INTRAVENOUS | Status: AC
  Administered 2013-02-04: 0.25 g
  Filled 2013-02-04: qty 50

## 2013-02-04 MED ORDER — FENTANYL CITRATE 0.05 MG/ML IJ SOLN
INTRAMUSCULAR | Status: DC | PRN
  Administered 2013-02-04: 50 ug via INTRAVENOUS
  Administered 2013-02-04: 150 ug via INTRAVENOUS

## 2013-02-04 MED ORDER — LIDOCAINE HCL (CARDIAC) 20 MG/ML IV SOLN
INTRAVENOUS | Status: DC | PRN
  Administered 2013-02-04: 80 mg via INTRAVENOUS

## 2013-02-04 MED ORDER — WARFARIN SODIUM 2.5 MG PO TABS
3.7500 mg | ORAL_TABLET | Freq: Once | ORAL | Status: DC
  Filled 2013-02-04: qty 1

## 2013-02-04 MED ORDER — 0.9 % SODIUM CHLORIDE (POUR BTL) OPTIME
TOPICAL | Status: DC | PRN
  Administered 2013-02-04: 1000 mL

## 2013-02-04 MED ORDER — SUCCINYLCHOLINE CHLORIDE 20 MG/ML IJ SOLN
INTRAMUSCULAR | Status: DC | PRN
  Administered 2013-02-04: 100 mg via INTRAVENOUS

## 2013-02-04 MED ORDER — ETOMIDATE 2 MG/ML IV SOLN
INTRAVENOUS | Status: DC | PRN
  Administered 2013-02-04: 20 mg via INTRAVENOUS

## 2013-02-04 SURGICAL SUPPLY — 38 items
BAG URO CATCHER STRL LF (DRAPE) ×4 IMPLANT
BANDAGE GAUZE ELAST BULKY 4 IN (GAUZE/BANDAGES/DRESSINGS) ×4 IMPLANT
BLADE SURG 15 STRL LF DISP TIS (BLADE) ×4 IMPLANT
BLADE SURG 15 STRL SS (BLADE) ×4
BNDG GAUZE ELAST 4 BULKY (GAUZE/BANDAGES/DRESSINGS) ×4 IMPLANT
CANISTER SUCTION 2500CC (MISCELLANEOUS) ×4 IMPLANT
CLOTH BEACON ORANGE TIMEOUT ST (SAFETY) ×4 IMPLANT
CONT SPEC 4OZ CLIKSEAL STRL BL (MISCELLANEOUS) ×4 IMPLANT
DRSG PAD ABDOMINAL 8X10 ST (GAUZE/BANDAGES/DRESSINGS) ×4 IMPLANT
ELECT REM PT RETURN 9FT ADLT (ELECTROSURGICAL) ×4
ELECTRODE REM PT RTRN 9FT ADLT (ELECTROSURGICAL) ×2 IMPLANT
GLOVE BIO SURGEON STRL SZ7.5 (GLOVE) ×8 IMPLANT
GLOVE BIOGEL PI IND STRL 6.5 (GLOVE) ×2 IMPLANT
GLOVE BIOGEL PI IND STRL 7.5 (GLOVE) ×2 IMPLANT
GLOVE BIOGEL PI INDICATOR 6.5 (GLOVE) ×2
GLOVE BIOGEL PI INDICATOR 7.5 (GLOVE) ×2
GLOVE ORTHO TXT STRL SZ7.5 (GLOVE) ×4 IMPLANT
GLOVE SURG SS PI 6.0 STRL IVOR (GLOVE) ×4 IMPLANT
GOWN PREVENTION PLUS XLARGE (GOWN DISPOSABLE) ×4 IMPLANT
KIT BASIN OR (CUSTOM PROCEDURE TRAY) ×4 IMPLANT
KIT ROOM TURNOVER OR (KITS) ×4 IMPLANT
NS IRRIG 1000ML POUR BTL (IV SOLUTION) ×8 IMPLANT
PACK CYSTO (CUSTOM PROCEDURE TRAY) ×4 IMPLANT
PACK GENERAL/GYN (CUSTOM PROCEDURE TRAY) ×4 IMPLANT
PAD ARMBOARD 7.5X6 YLW CONV (MISCELLANEOUS) ×8 IMPLANT
SOLUTION BETADINE 4OZ (MISCELLANEOUS) ×4 IMPLANT
SPONGE GAUZE 4X4 12PLY (GAUZE/BANDAGES/DRESSINGS) ×4 IMPLANT
SPONGE LAP 18X18 X RAY DECT (DISPOSABLE) ×8 IMPLANT
SUT CHROMIC 3 0 SH 27 (SUTURE) ×4 IMPLANT
SUT VIC AB 3-0 SH 27 (SUTURE) ×8
SUT VIC AB 3-0 SH 27XBRD (SUTURE) ×8 IMPLANT
SUT VIC AB 4-0 SH 27 (SUTURE) ×4
SUT VIC AB 4-0 SH 27XBRD (SUTURE) ×4 IMPLANT
SWAB COLLECTION DEVICE MRSA (MISCELLANEOUS) ×4 IMPLANT
TOWEL OR 17X24 6PK STRL BLUE (TOWEL DISPOSABLE) ×4 IMPLANT
TOWEL OR 17X26 10 PK STRL BLUE (TOWEL DISPOSABLE) ×4 IMPLANT
TRAY FOLEY CATH 14FR (SET/KITS/TRAYS/PACK) ×4 IMPLANT
WATER STERILE IRR 1000ML POUR (IV SOLUTION) ×4 IMPLANT

## 2013-02-04 NOTE — Progress Notes (Addendum)
I have seen and examined this patient.  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  Penile vessels are calcified on CT pelvis.  Will proceed with circumcision, local debridement and cystoscopy with possible SP tube placement.  Recommend checking PTH levels (397.1 in August).

## 2013-02-04 NOTE — Anesthesia Procedure Notes (Signed)
Procedure Name: Intubation Date/Time: 02/04/2013 9:15 PM Performed by: Jerilee Hoh Pre-anesthesia Checklist: Patient identified, Emergency Drugs available, Suction available and Patient being monitored Patient Re-evaluated:Patient Re-evaluated prior to inductionOxygen Delivery Method: Circle system utilized Preoxygenation: Pre-oxygenation with 100% oxygen Intubation Type: IV induction Ventilation: Mask ventilation without difficulty Grade View: Grade I Tube type: Oral Tube size: 7.5 mm Number of attempts: 1 Airway Equipment and Method: Video-laryngoscopy and Stylet Placement Confirmation: ETT inserted through vocal cords under direct vision,  positive ETCO2 and breath sounds checked- equal and bilateral Secured at: 22 cm Tube secured with: Tape Dental Injury: Teeth and Oropharynx as per pre-operative assessment  Comments: Glidescope used d/t anticipated difficult airway. Grade I view with Glide. Atraumatic oral intubation.

## 2013-02-04 NOTE — Op Note (Signed)
Preoperative Diagnosis: Gangrene of penile shaft  Post-operative Diagnosis: Gangrene of penile shaft  Procedure: Circumcision, debridement of penile shaft skin  Surgeon: Maryanna Shape, J   Anesthesia: General endotracheal anesthesia  Specimen: Foreskin  Complications: None  EBL: 50 ml  Findings: Necrosis of distal penile shaft skin and foreskin.  Glans with superficial fibrinous exudate and healthy bleeding tissue beneath.  Urethra patent.  Medical necessity: This is a 48 yo male with a history of end stage renal disease on hemodialysis and diabetes who was noted to have infection of the penile shaft and glans.  He was taken to the operating room for urgent debridement.  Description of procedure: The patient was taken to the operating room, placed on the operating room table in the lithotomy position.  His genitals were prepped and draped in the usual fashion.  A foley catheter was placed and want smoothly into the bladder with return of yellow urine.  A dorsal slit was performed allowing delivery of the glans penis.  The glans was cleaned with betadine solution.  The foreskin removed in the manner of a sleeve circumcision, however the mucosal collar as well as the more proximal glans skin were noted to be necrotic.  The glans skin was debrided to healthy, bleeding tissue.  At this point the glans itself was addressed.  The glans skin was debrided dorsally with immediate encountering of healthy, bleeding tissue.  This areas was closed with interrupted vicryl suture.  The decision was made to proceed with local wound care to the glans rather than aggressive debridement at this time.  The wound was dressed with wet to dry dressings.  The patient was transferred to the ICU intubated and in stable condition.  Disposition: ICU  Condition: Stable  Plan:  Will perform wet to dry dressing changes and monitor wound closely.  Will take back for further debridement as indicated.

## 2013-02-04 NOTE — Progress Notes (Signed)
UR completed 

## 2013-02-04 NOTE — Consult Note (Signed)
Consult Note  Patient name: Adam [name] MRN: [id number] DOB: 1965-05-25 Sex: male  Consulting Physician:  Medicine  Reason for Consult:  Chief Complaint  Patient presents with  . Extremity Pain    HISTORY OF PRESENT ILLNESS: This is a 48 year old gentleman with a chronic left foot ulcer.  He has previously been receiving care from home health.  He has also been on antibiotics.  He states that his foot is improving.  He also complains on a new wound on the inner side of the left thigh around the knee.  He states that he had blistering followed by drainage of a lot of fluid.  He complains of pain.  He denies fevers.  The patient suffers from type 2 diabetes.  His hemoglobin A1c was 4.9 in November.  He suffers from end-stage renal disease.  He is currently on dialysis via a right-sided fistula.  He has a ICD in place.  He is on Coumadin for nonischemic cardiomyopathy  Past Medical History  Diagnosis Date  . Other and unspecified hyperlipidemia   . Insomnia, unspecified   . Obstructive sleep apnea (adult) (pediatric)   . Allergic rhinitis, cause unspecified   . Nonischemic cardiomyopathy     s/p St. Jude ICD  . Unspecified essential hypertension   . Type II or unspecified type diabetes mellitus without mention of complication, not stated as uncontrolled   . CKD (chronic kidney disease) stage 3, GFR 30-59 ml/min   . Esophageal reflux   . Morbid obesity   . Dysuria   . Tobacco use disorder   . Dysrhythmia   . Pacemaker   . Antral ulcer   . Renal azotemia   . Arthritis     Gout w/hyperuricemia  . Morbid obesity   . Shortness of breath   . Automatic implantable cardioverter-defibrillator in situ     Past Surgical History  Procedure Laterality Date  . Cardiac defibrillator placement  2011    St. Jude  . Esophagogastroduodenoscopy  01/03/2011    Procedure: ESOPHAGOGASTRODUODENOSCOPY (EGD);  Surgeon: Vertell Novak., MD;  Location: [facility] ENDOSCOPY;  Service: Endoscopy;   Laterality: N/A;  . Av fistula placement Right 08/31/2012    Procedure: ARTERIOVENOUS (AV) FISTULA CREATION;  Surgeon: Chuck Hint, MD;  Location: [facility] OR;  Service: Vascular;  Laterality: Right;  . Insertion of dialysis catheter Right 08/31/2012    Procedure: INSERTION OF DIALYSIS CATHETER-Right Internal Jugular Placement;  Surgeon: Chuck Hint, MD;  Location: [facility] OR;  Service: Vascular;  Laterality: Right;    History   Social History  . Marital Status: Single    Spouse Name: N/A    Number of Children: N/A  . Years of Education: N/A   Occupational History  . Not on file.   Social History Main Topics  . Smoking status: Current Every Day Smoker -- 0.25 packs/day    Types: Cigarettes  . Smokeless tobacco: Never Used     Comment:  pt states that he will quit on his own and is in the process of quiting  . Alcohol Use: No     Comment: No alcohol x 1 month.  . Drug Use: No  . Sexual Activity: Not on file   Other Topics Concern  . Not on file   Social History Narrative  . No narrative on file    Family History  Problem Relation Age of Onset  . Diabetes Mother   . Microcephaly Father   .  Lung cancer Father     Allergies as of 02/03/2013 - Review Complete 02/03/2013  Allergen Reaction Noted  . Argatroban Other (See Comments) 09/23/2012  . Ace inhibitors  06/24/2012  . Heparin  09/17/2012    No current facility-administered medications on file prior to encounter.   Current Outpatient Prescriptions on File Prior to Encounter  Medication Sig Dispense Refill  . acetaminophen (TYLENOL) 500 MG tablet Take 500 mg by mouth every 6 (six) hours as needed.      Marland Kitchen. amiodarone (PACERONE) 200 MG tablet Take 200 mg by mouth daily.      . colchicine 0.6 MG tablet Take 1 tablet (0.6 mg total) by mouth See admin instructions. Takes every three days.  15 tablet  2  . diclofenac sodium (VOLTAREN) 1 % GEL Apply 2 g topically 4 (four) times daily.  1 Tube  2  . Febuxostat  (ULORIC) 80 MG TABS Take 1 tablet (80 mg total) by mouth daily.  30 tablet  5  . fluticasone (FLONASE) 50 MCG/ACT nasal spray Place 1 spray into both nostrils daily as needed for allergies. For allergies  16 g  2  . Insulin Glargine (LANTUS SOLOSTAR) 100 UNIT/ML Solostar Pen Inject 45 Units into the skin daily at 10 pm.          REVIEW OF SYSTEMS: Please see review of systems from admission history and physical  PHYSICAL EXAMINATION: General: The patient appears their stated age.  Vital signs are BP 100/57  Pulse 84  Temp(Src) 98.8 F (37.1 C) (Oral)  Resp 22  Wt 318 lb 2 oz (144.3 kg)  SpO2 96% Pulmonary: Respirations are non-labored HEENT:  No gross abnormalities Abdomen: Soft and non-tender  Musculoskeletal: There are no major deformities.   Neurologic: No focal weakness or paresthesias are detected, Skin: Nearly healed wound on the dorsum of the left foot.  Areas of desquamation of the medial side of the left thigh. Psychiatric: The patient has normal affect. Cardiovascular: There is a regular rate and rhythm without significant murmur appreciated. Significant bilateral lower extremity edema with brawny discoloration.  The patient has a palpable left dorsalis pedis pulse  Diagnostic Studies: Ankle-brachial indices from late last year were within normal limits   Assessment:  Left leg ulcers Plan: Given the patient's vascular lab study at the end of last year which showed normal waveforms, coupled with the fact that the patient has palpable pulses, I think that is unlikely these ulcers are arterial in origin.  Most likely, his ulcers are secondary to volume overload and chronic lower extremity edema.  The best treatment for this will be leg elevation and compression.  He will likely benefit from a multilayer compression, such as a UNNA boot which can be applied as an outpatient and changed 3 times a week with home health.  He will also benefit from optimizing volume removal with  dialysis and his underlying cardiac dysfunction.  No acute vascular intervention is required at this time.  Please contact me with any questions or concerns.     Jorge NyV. Wells Brabham IV, M.D. [facility] Office: [phone] Pager:  [phone]

## 2013-02-04 NOTE — Progress Notes (Signed)
Late Entry Patient leaving for OR at this time. Wife at bedside going downstairs with him. Hall Pass complete. Ticket to ride printed. Belongings left in room awaiting his arrival.  Doristine Devoid BSN RN  [facility] [address] [phone]

## 2013-02-04 NOTE — Consult Note (Signed)
Urology Consult   Physician requesting consult: Windell Hummingbird  Reason for consult: Balanitis with paraphimosis  History of Present Illness: Adam [name] is a 48 y.o. obese male with PMH significant for HTN, ESRD on HD, CHF, DM II, and afib on coumadin who was recently admitted for eval and treatment of penile pain with discharge as well as leg pain.  Pt has chronic LE edema and leg wounds for which he has been receiving HH.    Pt states that approx 2 weeks ago he began to note penile itching, clear discharge, pain, and some swelling.  This progressively got worse and he scratched himself creating a laceration.  This required treatment in the ED last Sunday at which time  (3) sutures were placed in the foreskin.  Since that time he states the pain and discharge have continued to progress.  He did not have any difficulty retracting his foreskin until after the ED visit .  He has not been sexually active for over 6 months.  He is on HD but does still urinate usually once a day.  He states this has not changed but he has noted some dysuria over the past two weeks. He denies any history of previous issues like this.  He denies F/C/N/V.  He had an elevated temp at time of admission (100.3) and a leukocytosis with WBC of 15.9.    He is resting comfortably and denies fevers, chills, HA, CP, SOB, N/V, and diarrhea/constipation.  Past Medical History  Diagnosis Date  . Other and unspecified hyperlipidemia   . Insomnia, unspecified   . Obstructive sleep apnea (adult) (pediatric)   . Allergic rhinitis, cause unspecified   . Nonischemic cardiomyopathy     s/p St. Jude ICD  . Unspecified essential hypertension   . Type II or unspecified type diabetes mellitus without mention of complication, not stated as uncontrolled   . CKD (chronic kidney disease) stage 3, GFR 30-59 ml/min   . Esophageal reflux   . Morbid obesity   . Dysuria   . Tobacco use disorder   . Dysrhythmia   . Pacemaker   . Antral  ulcer   . Renal azotemia   . Arthritis     Gout w/hyperuricemia  . Morbid obesity   . Shortness of breath   . Automatic implantable cardioverter-defibrillator in situ     Past Surgical History  Procedure Laterality Date  . Cardiac defibrillator placement  2011    St. Jude  . Esophagogastroduodenoscopy  01/03/2011    Procedure: ESOPHAGOGASTRODUODENOSCOPY (EGD);  Surgeon: Vertell Novak., MD;  Location: [facility] ENDOSCOPY;  Service: Endoscopy;  Laterality: N/A;  . Av fistula placement Right 08/31/2012    Procedure: ARTERIOVENOUS (AV) FISTULA CREATION;  Surgeon: Chuck Hint, MD;  Location: [facility] OR;  Service: Vascular;  Laterality: Right;  . Insertion of dialysis catheter Right 08/31/2012    Procedure: INSERTION OF DIALYSIS CATHETER-Right Internal Jugular Placement;  Surgeon: Chuck Hint, MD;  Location: [facility] OR;  Service: Vascular;  Laterality: Right;    Current Hospital Medications:  Home Meds:    Medication List    ASK your doctor about these medications       acetaminophen 500 MG tablet  Commonly known as:  TYLENOL  Take 500 mg by mouth every 6 (six) hours as needed.     amiodarone 200 MG tablet  Commonly known as:  PACERONE  Take 200 mg by mouth daily.     colchicine 0.6 MG tablet  Take  1 tablet (0.6 mg total) by mouth See admin instructions. Takes every three days.     diclofenac sodium 1 % Gel  Commonly known as:  VOLTAREN  Apply 2 g topically 4 (four) times daily.     esomeprazole 20 MG capsule  Commonly known as:  NEXIUM  Take 20 mg by mouth daily at 12 noon. TAKE ONE CAPSULE EVERY DAY BEFORE BREAKFAST     Febuxostat 80 MG Tabs  Commonly known as:  ULORIC  Take 1 tablet (80 mg total) by mouth daily.     fluticasone 50 MCG/ACT nasal spray  Commonly known as:  FLONASE  Place 1 spray into both nostrils daily as needed for allergies. For allergies     LANTUS SOLOSTAR 100 UNIT/ML Solostar Pen  Generic drug:  Insulin Glargine  Inject 45 Units into the  skin daily at 10 pm.     loratadine 10 MG tablet  Commonly known as:  CLARITIN  Take 10 mg by mouth daily as needed for allergies.     LORazepam 0.5 MG tablet  Commonly known as:  ATIVAN  Take 0.5 mg by mouth daily. TAKE 1 TABLET TWICE A DAY AS NEEDED     traMADol 50 MG tablet  Commonly known as:  ULTRAM  Take 100 mg by mouth 2 (two) times daily.     warfarin 2.5 MG tablet  Commonly known as:  COUMADIN  Take 2.5 mg by mouth daily. Takes 1 and half tab every day except on Monday. Monday takes just 2.5 mg.        Scheduled Meds: . amiodarone  200 mg Oral Daily  . amoxicillin-clavulanate  1 tablet Oral TID  . Chlorhexidine Gluconate Cloth  6 each Topical Q0600  . colchicine  0.6 mg Oral Q3 days  . darbepoetin (ARANESP) injection - DIALYSIS  200 mcg Intravenous Q Thu-HD  . diclofenac sodium  2 g Topical QID  . doxercalciferol  6 mcg Intravenous Q T,Th,Sa-HD  . Febuxostat  1 tablet Oral Daily  . ferric gluconate (FERRLECIT/NULECIT) IV  125 mg Intravenous Q T,Th,Sa-HD  . insulin aspart  0-9 Units Subcutaneous Q4H  . insulin glargine  45 Units Subcutaneous QHS  . LORazepam  0.5 mg Oral Daily  . multivitamin  1 tablet Oral QHS  . mupirocin ointment  1 application Nasal BID  . pantoprazole  40 mg Oral QAC breakfast  . sevelamer carbonate  1,600 mg Oral TID WC  . sodium chloride  3 mL Intravenous Q12H  . traMADol  100 mg Oral BID  . warfarin  3.75 mg Oral ONCE-1800  . Warfarin - Pharmacist Dosing Inpatient   Does not apply q1800   Continuous Infusions: . sodium chloride 500 mL (02/03/13 0730)   PRN Meds:.sodium chloride, acetaminophen, fluticasone, loratadine, sodium chloride  Allergies:  Allergies  Allergen Reactions  . Argatroban Other (See Comments)    Ventricular tachycardia  . Ace Inhibitors     Acute renal failure with multiple trials  . Heparin     Thrombcytopenia. SRA positive. See miscellaneous test (SRA results)    Family History  Problem Relation Age of  Onset  . Diabetes Mother   . Microcephaly Father   . Lung cancer Father     Social History:  reports that he has been smoking Cigarettes.  He has been smoking about 0.25 packs per day. He has never used smokeless tobacco. He reports that he does not drink alcohol or use illicit drugs.  ROS: A complete review  of systems was performed.  All systems are negative except for pertinent findings as noted.  Physical Exam:  Vital signs in last 24 hours: Temp:  [98.1 F (36.7 C)-100.3 F (37.9 C)] 98.8 F (37.1 C) (01/09 1000) Pulse Rate:  [70-129] 84 (01/09 1000) Resp:  [18-38] 22 (01/09 1000) BP: (84-126)/(30-99) 100/57 mmHg (01/09 1000) SpO2:  [95 %-100 %] 96 % (01/09 1000) Weight:  [144.3 kg (318 lb 2 oz)] 144.3 kg (318 lb 2 oz) (01/08 2056) Constitutional:  Alert and oriented, No acute distress Cardiovascular: Regular rate and rhythm, No JVD Respiratory: Normal respiratory effort, Lungs clear bilaterally GI: Abdomen is soft, nontender, nondistended, no abdominal masses GU: No CVA tenderness Lymphatic: No lymphadenopathy Neurologic: Grossly intact, no focal deficits Psychiatric: Normal mood and affect  Laboratory Data:   Recent Labs  02/03/13 0545 02/04/13 0352  WBC 15.9* 14.8*  HGB 8.3* 7.2*  HCT 27.0* 22.4*  PLT 478* 403*     Recent Labs  02/03/13 0545  NA 134*  K 4.8  CL 90*  GLUCOSE 114*  BUN 30*  CALCIUM 9.3  CREATININE 5.75*     Results for orders placed during the hospital encounter of 02/03/13 (from the past 24 hour(s))  FERRITIN     Status: None   Collection Time    02/03/13  3:25 PM      Result Value Range   Ferritin 275  22 - 322 ng/mL  HEPATITIS B SURFACE ANTIGEN     Status: None   Collection Time    02/03/13  3:25 PM      Result Value Range   Hepatitis B Surface Ag NEGATIVE  NEGATIVE  GLUCOSE, CAPILLARY     Status: Abnormal   Collection Time    02/03/13 11:39 PM      Result Value Range   Glucose-Capillary 146 (*) 70 - 99 mg/dL  CBC      Status: Abnormal   Collection Time    02/04/13  3:52 AM      Result Value Range   WBC 14.8 (*) 4.0 - 10.5 K/uL   RBC 2.50 (*) 4.22 - 5.81 MIL/uL   Hemoglobin 7.2 (*) 13.0 - 17.0 g/dL   HCT 16.1 (*) 09.6 - 04.5 %   MCV 89.6  78.0 - 100.0 fL   MCH 28.8  26.0 - 34.0 pg   MCHC 32.1  30.0 - 36.0 g/dL   RDW 40.9 (*) 81.1 - 91.4 %   Platelets 403 (*) 150 - 400 K/uL  HIV ANTIBODY (ROUTINE TESTING)     Status: None   Collection Time    02/04/13  3:52 AM      Result Value Range   HIV NON REACTIVE  NON REACTIVE  GLUCOSE, CAPILLARY     Status: Abnormal   Collection Time    02/04/13  4:19 AM      Result Value Range   Glucose-Capillary 132 (*) 70 - 99 mg/dL  GLUCOSE, CAPILLARY     Status: None   Collection Time    02/04/13  7:46 AM      Result Value Range   Glucose-Capillary 84  70 - 99 mg/dL  GLUCOSE, CAPILLARY     Status: Abnormal   Collection Time    02/04/13 11:39 AM      Result Value Range   Glucose-Capillary 118 (*) 70 - 99 mg/dL   Recent Results (from the past 240 hour(s))  CULTURE, BLOOD (ROUTINE X 2)     Status: None  Collection Time    02/03/13  5:45 AM      Result Value Range Status   Specimen Description BLOOD ARM LEFT   Final   Special Requests BOTTLES DRAWN AEROBIC AND ANAEROBIC 10CC   Final   Culture  Setup Time     Final   Value: 02/03/2013 13:55     Performed at [facility]   Culture     Final   Value:        BLOOD CULTURE RECEIVED NO GROWTH TO DATE CULTURE WILL BE HELD FOR 5 DAYS BEFORE ISSUING A FINAL NEGATIVE REPORT     Performed at [facility]   Report Status PENDING   Incomplete  CULTURE, BLOOD (ROUTINE X 2)     Status: None   Collection Time    02/03/13  5:52 AM      Result Value Range Status   Specimen Description BLOOD HAND LEFT   Final   Special Requests BOTTLES DRAWN AEROBIC ONLY 8CC   Final   Culture  Setup Time     Final   Value: 02/03/2013 13:54     Performed at [facility]   Culture     Final   Value:        BLOOD  CULTURE RECEIVED NO GROWTH TO DATE CULTURE WILL BE HELD FOR 5 DAYS BEFORE ISSUING A FINAL NEGATIVE REPORT     Performed at [facility]   Report Status PENDING   Incomplete  MRSA PCR SCREENING     Status: Abnormal   Collection Time    02/03/13 12:07 PM      Result Value Range Status   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 It is not     intended to diagnose MRSA     infection nor to guide or     monitor treatment for     MRSA infections.     RESULT CALLED TO, READ BACK BY AND VERIFIED WITH:     A. BRAKE RN 14:30 02/03/13 ([email or web address])    Renal Function:  Recent Labs  02/03/13 0545  CREATININE 5.75*   The CrCl is unknown because both a height and weight (above a minimum accepted value) are required for this calculation.  Radiologic Imaging: Dg Chest 2 View  02/03/2013   CLINICAL DATA:  Short of breath.  Dialysis patient  EXAM: CHEST  2 VIEW  COMPARISON:  09/10/2012.  FINDINGS: Marked cardiac enlargement without fluid overload. Negative for edema or effusion. Negative for pneumonia. AICD unchanged in position.  IMPRESSION: Marked cardiac enlargement.  No acute abnormality   Electronically Signed   By: Marlan Palau M.D.   On: 02/03/2013 07:06   Ct Pelvis Wo Contrast  02/03/2013   CLINICAL DATA:  Wounds on the left thigh and dorsum of the left foot. Blistering left leg. Elevated white blood cell count.  EXAM: CT TIBIA FIBULA LEFT WITHOUT CONTRAST; CT OF THE LEFT FEMUR WITHOUT CONTRAST; CT PELVIS WITHOUT CONTRAST  TECHNIQUE: Multidetector CT imaging of the pelvis, left femur and left lower leg was performed following the standard protocol without intravenous contrast.  COMPARISON:  None.  FINDINGS: PELVIS:  There is mild infiltration of subcutaneous fat  about the pelvis. No focal fluid collection is  identified and there is no fluid collection within the pelvis itself. There is no lymphadenopathy. No bony destructive change or other bony abnormality is identified. No hip joint effusion is seen.  LEFT THIGH:  The right thigh is partially visualized on the axial and coronal images. Cutaneous tissues appear thickened bilaterally. There is also infiltration of subcutaneous fat bilaterally which appears fairly symmetric. No focal fluid collection is identified. Fat planes within muscle are preserved. Multiple varicosities are noted. No muscle or tendon tear is seen. No radiopaque foreign body is identified. There is atherosclerotic vascular disease. No bony destructive change or other focal bony lesion is identified.  LEFT LOWER LEG:  Cutaneous tissues appear thickened about the medial margin of the left lower leg where there appear to be blisters at the skin surface. There is some infiltration of subcutaneous fat about the lower legs bilaterally, worse on the left. Fat planes within muscle are preserved. No focal fluid collection is seen in the subcutaneous or muscular tissues. No bony destructive change or other focal bony lesion is present. All visualized muscles and tendons appear intact.  IMPRESSION: Findings compatible cellulitis and/or dependent change about both legs and the pelvis. Blistering along the skin surface is seen in the left lower leg. No abscess is identified and there is no CT evidence of osteomyelitis.  Atherosclerosis.   Electronically Signed   By: Drusilla Kanner M.D.   On: 02/03/2013 08:30   Ct Femur Left Wo Contrast  02/03/2013   CLINICAL DATA:  Wounds on the left thigh and dorsum of the left foot. Blistering left leg. Elevated white blood cell count.  EXAM: CT TIBIA FIBULA LEFT WITHOUT CONTRAST; CT OF THE LEFT FEMUR WITHOUT CONTRAST; CT PELVIS WITHOUT CONTRAST  TECHNIQUE: Multidetector CT imaging of the pelvis, left femur and left lower leg was performed following the standard protocol  without intravenous contrast.  COMPARISON:  None.  FINDINGS: PELVIS:  There is mild infiltration of subcutaneous fat about the pelvis. No focal fluid collection is identified and there is no fluid collection within the pelvis itself. There is no lymphadenopathy. No bony destructive change or other bony abnormality is identified. No hip joint effusion is seen.  LEFT THIGH:  The right thigh is partially visualized on the axial and coronal images. Cutaneous tissues appear thickened bilaterally. There is also infiltration of subcutaneous fat bilaterally which appears fairly symmetric. No focal fluid collection is identified. Fat planes within muscle are preserved. Multiple varicosities are noted. No muscle or tendon tear is seen. No radiopaque foreign body is identified. There is atherosclerotic vascular disease. No bony destructive change or other focal bony lesion is identified.  LEFT LOWER LEG:  Cutaneous tissues appear thickened about the medial margin of the left lower leg where there appear to be blisters at the skin surface. There is some infiltration of subcutaneous fat about the lower legs bilaterally, worse on the left. Fat planes within muscle are preserved. No focal fluid collection is seen in the subcutaneous or muscular tissues. No bony destructive change or other focal bony lesion is present. All visualized muscles and tendons appear intact.  IMPRESSION: Findings compatible cellulitis and/or dependent change about both legs and the pelvis. Blistering along the skin surface is seen in the left lower leg. No abscess is identified and there is no CT evidence of osteomyelitis.  Atherosclerosis.   Electronically Signed   By: Drusilla Kanner M.D.   On: 02/03/2013 08:30  Ct Tibia Fibula Left Wo Contrast  02/03/2013   CLINICAL DATA:  Wounds on the left thigh and dorsum of the left foot. Blistering left leg. Elevated white blood cell count.  EXAM: CT TIBIA FIBULA LEFT WITHOUT CONTRAST; CT OF THE LEFT FEMUR  WITHOUT CONTRAST; CT PELVIS WITHOUT CONTRAST  TECHNIQUE: Multidetector CT imaging of the pelvis, left femur and left lower leg was performed following the standard protocol without intravenous contrast.  COMPARISON:  None.  FINDINGS: PELVIS:  There is mild infiltration of subcutaneous fat about the pelvis. No focal fluid collection is identified and there is no fluid collection within the pelvis itself. There is no lymphadenopathy. No bony destructive change or other bony abnormality is identified. No hip joint effusion is seen.  LEFT THIGH:  The right thigh is partially visualized on the axial and coronal images. Cutaneous tissues appear thickened bilaterally. There is also infiltration of subcutaneous fat bilaterally which appears fairly symmetric. No focal fluid collection is identified. Fat planes within muscle are preserved. Multiple varicosities are noted. No muscle or tendon tear is seen. No radiopaque foreign body is identified. There is atherosclerotic vascular disease. No bony destructive change or other focal bony lesion is identified.  LEFT LOWER LEG:  Cutaneous tissues appear thickened about the medial margin of the left lower leg where there appear to be blisters at the skin surface. There is some infiltration of subcutaneous fat about the lower legs bilaterally, worse on the left. Fat planes within muscle are preserved. No focal fluid collection is seen in the subcutaneous or muscular tissues. No bony destructive change or other focal bony lesion is present. All visualized muscles and tendons appear intact.  IMPRESSION: Findings compatible cellulitis and/or dependent change about both legs and the pelvis. Blistering along the skin surface is seen in the left lower leg. No abscess is identified and there is no CT evidence of osteomyelitis.  Atherosclerosis.   Electronically Signed   By: Drusilla Kannerhomas  Dalessio M.D.   On: 02/03/2013 08:30   Physical Exam: Pt in dialysis. Comp[lete GU exam: Necrotic foreskin  with necrotic glans. Cannot identify urethra. Odiferous, with "sweet" smeel. No definite crepitance. Demarcation of foreskin at mid-shaft.  Scrotum: normal, testes wnl.    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 I have discussed the situation with the patient and his nurses. Original plan for possible debridement in AM needs to be moved up to tonight because of the necrotizing faciitis. I am concerned that delay until Am may allow necrosis to advance proximalward to the pubic wall and to the scrotum. He still makes some urine, and will need a perineal urethrostomy.   Silas FloodYARBROUGH,AMANDA G. 02/04/2013, 1:48 PM

## 2013-02-04 NOTE — Anesthesia Preprocedure Evaluation (Signed)
Anesthesia Evaluation  Patient identified by MRN, date of birth, ID band Patient awake    Reviewed: Allergy & Precautions, H&P , NPO status   Airway Mallampati: II  Neck ROM: Full    Dental  (+) Teeth Intact   Pulmonary shortness of breath, Recent URI , Current Smoker,    + decreased breath sounds      Cardiovascular hypertension, + dysrhythmias + pacemaker + Cardiac Defibrillator Rhythm:Regular Rate:Tachycardia     Neuro/Psych    GI/Hepatic GERD-  Poorly Controlled,  Endo/Other  diabetesMorbid obesity  Renal/GU ESRF and DialysisRenal disease     Musculoskeletal   Abdominal   Peds  Hematology   Anesthesia Other Findings   Reproductive/Obstetrics                           Anesthesia Physical Anesthesia Plan  ASA: IV and emergent  Anesthesia Plan: General   Post-op Pain Management:    Induction: Intravenous  Airway Management Planned: Oral ETT  Additional Equipment:   Intra-op Plan:   Post-operative Plan: Extubation in OR  Informed Consent: I have reviewed the patients History and Physical, chart, labs and discussed the procedure including the risks, benefits and alternatives for the proposed anesthesia with the patient or authorized representative who has indicated his/her understanding and acceptance.   Dental advisory given  Plan Discussed with: CRNA and Surgeon  Anesthesia Plan Comments:         Anesthesia Quick Evaluation

## 2013-02-04 NOTE — Procedures (Signed)
I have personally attended this patient's dialysis session. (Extra tmt for vol removal) UF goal of 4+ liters SBP 85/47 to start so not likely to effectively UF Midodrine and albumin to support BP 2K bath pending labs  Torah Pinnock B

## 2013-02-04 NOTE — Progress Notes (Signed)
Subjective: Patient still with some penile pain, though his pain is well controlled on his home ultram. Patient has no further complaints today. He had HD yesterday and nephrology plans to do another session today with the goal of decreasing his EDW further. WC saw patient for skin ulcerations yesterday, no infection suspected over L medial thigh.  Objective: Vital signs in last 24 hours: Filed Vitals:   02/03/13 2002 02/03/13 2006 02/03/13 2056 02/04/13 0437  BP: 122/99 125/77 98/60 84/57   Pulse: 70 116 84 84  Temp:  98.3 F (36.8 C) 99.3 F (37.4 C) 100.3 F (37.9 C)  TempSrc:  Oral Oral Oral  Resp: 18 20 20 22   Weight:  318 lb 2 oz (144.3 kg) 318 lb 2 oz (144.3 kg)   SpO2:  95% 100% 96%   Weight change:   Intake/Output Summary (Last 24 hours) at 02/04/13 0656 Last data filed at 02/04/13 0500  Gross per 24 hour  Intake    240 ml  Output   4000 ml  Net  -3760 ml   Physical Exam General: alert, cooperative, and in no apparent distress HEENT: NCAT, vision grossly intact, oropharynx clear and non-erythematous  Neck: supple Lungs: clear to ascultation bilaterally, normal work of respiration, no wheezes, rales, ronchi Heart: RRR, no murmurs, gallops, or rubs Abdomen: soft, non-tender, non-distended, normal bowel sounds Extremities:1+ pitting edema in lower extremities bilaterally; induration and skin hyperpigmentation w/ overlying blistering to medial thighs bilaterally that is TTP; small coalescing skin erosions and ulcerations without drainage or erythema to medial L knee. There are a few ulcers over the left foot, no drainage and appear to be healing well Neurologic: alert & oriented X3, cranial nerves II-XII intact, strength grossly intact, sensation intact to light touch GU: Foreskin is partially retracted and macerated; unable to move foreskin at all 2/2 edema and possible phimosis. There is yellow colored discharge around glans, but not from meatus. Entire penis is TTP,  though no TPP of scrotum  Lab Results: Basic Metabolic Panel:  Recent Labs Lab 02/03/13 0545  NA 134*  K 4.8  CL 90*  CO2 25  GLUCOSE 114*  BUN 30*  CREATININE 5.75*  CALCIUM 9.3   Liver Function Tests:  Recent Labs Lab 02/03/13 0545  AST 9  ALT <5  ALKPHOS 188*  BILITOT 1.6*  PROT 9.1*  ALBUMIN 2.2*   CBC:  Recent Labs Lab 02/03/13 0545 02/04/13 0352  WBC 15.9* 14.8*  NEUTROABS 12.6*  --   HGB 8.3* 7.2*  HCT 27.0* 22.4*  MCV 90.6 89.6  PLT 478* 403*   Cardiac Enzymes:  Recent Labs Lab 02/03/13 0545  TROPONINI <0.30   CBG:  Recent Labs Lab 01/30/13 0919 01/30/13 1118 02/03/13 1052 02/03/13 1127 02/03/13 2339 02/04/13 0419  GLUCAP 404* 328* 96 102* 146* 132*   Coagulation:  Recent Labs Lab 01/30/13 1000 02/03/13 0545  LABPROT 29.8* 23.7*  INR 2.96* 2.20*   Anemia Panel:  Recent Labs Lab 02/03/13 1525  FERRITIN 275   Urine Drug Screen: Drugs of Abuse     Component Value Date/Time   LABOPIA NEG 03/17/2006 1455   COCAINSCRNUR NEG 03/17/2006 1455   LABBENZ NEG 03/17/2006 1455   AMPHETMU NEG 03/17/2006 1455    Micro Results: Recent Results (from the past 240 hour(s))  MRSA PCR SCREENING     Status: Abnormal   Collection Time    02/03/13 12:07 PM      Result Value Range Status   MRSA by PCR  POSITIVE (*) NEGATIVE Final   Comment:            The GeneXpert MRSA Assay (FDA     approved for NASAL specimens     only), is one component of a     comprehensive MRSA colonization     surveillance program. It is not     intended to diagnose MRSA     infection nor to guide or     monitor treatment for     MRSA infections.     RESULT CALLED TO, READ BACK BY AND VERIFIED WITH:     A. BRAKE RN 14:30 02/03/13 ([email or web address])   Studies/Results: Dg Chest 2 View  02/03/2013   CLINICAL DATA:  Short of breath.  Dialysis patient  EXAM: CHEST  2 VIEW  COMPARISON:  09/10/2012.  FINDINGS: Marked cardiac enlargement without fluid overload. Negative for  edema or effusion. Negative for pneumonia. AICD unchanged in position.  IMPRESSION: Marked cardiac enlargement.  No acute abnormality   Electronically Signed   By: Marlan Palauharles  Clark M.D.   On: 02/03/2013 07:06   Ct Pelvis Wo Contrast  02/03/2013   CLINICAL DATA:  Wounds on the left thigh and dorsum of the left foot. Blistering left leg. Elevated white blood cell count.  EXAM: CT TIBIA FIBULA LEFT WITHOUT CONTRAST; CT OF THE LEFT FEMUR WITHOUT CONTRAST; CT PELVIS WITHOUT CONTRAST  TECHNIQUE: Multidetector CT imaging of the pelvis, left femur and left lower leg was performed following the standard protocol without intravenous contrast.  COMPARISON:  None.  FINDINGS: PELVIS:  There is mild infiltration of subcutaneous fat about the pelvis. No focal fluid collection is identified and there is no fluid collection within the pelvis itself. There is no lymphadenopathy. No bony destructive change or other bony abnormality is identified. No hip joint effusion is seen.  LEFT THIGH:  The right thigh is partially visualized on the axial and coronal images. Cutaneous tissues appear thickened bilaterally. There is also infiltration of subcutaneous fat bilaterally which appears fairly symmetric. No focal fluid collection is identified. Fat planes within muscle are preserved. Multiple varicosities are noted. No muscle or tendon tear is seen. No radiopaque foreign body is identified. There is atherosclerotic vascular disease. No bony destructive change or other focal bony lesion is identified.  LEFT LOWER LEG:  Cutaneous tissues appear thickened about the medial margin of the left lower leg where there appear to be blisters at the skin surface. There is some infiltration of subcutaneous fat about the lower legs bilaterally, worse on the left. Fat planes within muscle are preserved. No focal fluid collection is seen in the subcutaneous or muscular tissues. No bony destructive change or other focal bony lesion is present. All  visualized muscles and tendons appear intact.  IMPRESSION: Findings compatible cellulitis and/or dependent change about both legs and the pelvis. Blistering along the skin surface is seen in the left lower leg. No abscess is identified and there is no CT evidence of osteomyelitis.  Atherosclerosis.   Electronically Signed   By: Drusilla Kannerhomas  Dalessio M.D.   On: 02/03/2013 08:30   Ct Femur Left Wo Contrast  02/03/2013   CLINICAL DATA:  Wounds on the left thigh and dorsum of the left foot. Blistering left leg. Elevated white blood cell count.  EXAM: CT TIBIA FIBULA LEFT WITHOUT CONTRAST; CT OF THE LEFT FEMUR WITHOUT CONTRAST; CT PELVIS WITHOUT CONTRAST  TECHNIQUE: Multidetector CT imaging of the pelvis, left femur and left lower leg was performed following the standard  protocol without intravenous contrast.  COMPARISON:  None.  FINDINGS: PELVIS:  There is mild infiltration of subcutaneous fat about the pelvis. No focal fluid collection is identified and there is no fluid collection within the pelvis itself. There is no lymphadenopathy. No bony destructive change or other bony abnormality is identified. No hip joint effusion is seen.  LEFT THIGH:  The right thigh is partially visualized on the axial and coronal images. Cutaneous tissues appear thickened bilaterally. There is also infiltration of subcutaneous fat bilaterally which appears fairly symmetric. No focal fluid collection is identified. Fat planes within muscle are preserved. Multiple varicosities are noted. No muscle or tendon tear is seen. No radiopaque foreign body is identified. There is atherosclerotic vascular disease. No bony destructive change or other focal bony lesion is identified.  LEFT LOWER LEG:  Cutaneous tissues appear thickened about the medial margin of the left lower leg where there appear to be blisters at the skin surface. There is some infiltration of subcutaneous fat about the lower legs bilaterally, worse on the left. Fat planes within  muscle are preserved. No focal fluid collection is seen in the subcutaneous or muscular tissues. No bony destructive change or other focal bony lesion is present. All visualized muscles and tendons appear intact.  IMPRESSION: Findings compatible cellulitis and/or dependent change about both legs and the pelvis. Blistering along the skin surface is seen in the left lower leg. No abscess is identified and there is no CT evidence of osteomyelitis.  Atherosclerosis.   Electronically Signed   By: Drusilla Kanner M.D.   On: 02/03/2013 08:30   Ct Tibia Fibula Left Wo Contrast  02/03/2013   CLINICAL DATA:  Wounds on the left thigh and dorsum of the left foot. Blistering left leg. Elevated white blood cell count.  EXAM: CT TIBIA FIBULA LEFT WITHOUT CONTRAST; CT OF THE LEFT FEMUR WITHOUT CONTRAST; CT PELVIS WITHOUT CONTRAST  TECHNIQUE: Multidetector CT imaging of the pelvis, left femur and left lower leg was performed following the standard protocol without intravenous contrast.  COMPARISON:  None.  FINDINGS: PELVIS:  There is mild infiltration of subcutaneous fat about the pelvis. No focal fluid collection is identified and there is no fluid collection within the pelvis itself. There is no lymphadenopathy. No bony destructive change or other bony abnormality is identified. No hip joint effusion is seen.  LEFT THIGH:  The right thigh is partially visualized on the axial and coronal images. Cutaneous tissues appear thickened bilaterally. There is also infiltration of subcutaneous fat bilaterally which appears fairly symmetric. No focal fluid collection is identified. Fat planes within muscle are preserved. Multiple varicosities are noted. No muscle or tendon tear is seen. No radiopaque foreign body is identified. There is atherosclerotic vascular disease. No bony destructive change or other focal bony lesion is identified.  LEFT LOWER LEG:  Cutaneous tissues appear thickened about the medial margin of the left lower leg  where there appear to be blisters at the skin surface. There is some infiltration of subcutaneous fat about the lower legs bilaterally, worse on the left. Fat planes within muscle are preserved. No focal fluid collection is seen in the subcutaneous or muscular tissues. No bony destructive change or other focal bony lesion is present. All visualized muscles and tendons appear intact.  IMPRESSION: Findings compatible cellulitis and/or dependent change about both legs and the pelvis. Blistering along the skin surface is seen in the left lower leg. No abscess is identified and there is no CT evidence of osteomyelitis.  Atherosclerosis.   Electronically Signed   By: Drusilla Kanner M.D.   On: 02/03/2013 08:30   Medications: I have reviewed the patient's current medications. Scheduled Meds: . amiodarone  200 mg Oral Daily  . amoxicillin-clavulanate  1 tablet Oral TID  . Chlorhexidine Gluconate Cloth  6 each Topical Q0600  . colchicine  0.6 mg Oral Q3 days  . darbepoetin (ARANESP) injection - DIALYSIS  200 mcg Intravenous Q Thu-HD  . diclofenac sodium  2 g Topical QID  . doxercalciferol  6 mcg Intravenous Q T,Th,Sa-HD  . Febuxostat  1 tablet Oral Daily  . ferric gluconate (FERRLECIT/NULECIT) IV  125 mg Intravenous Q T,Th,Sa-HD  . insulin aspart  0-9 Units Subcutaneous Q4H  . insulin glargine  45 Units Subcutaneous QHS  . LORazepam  0.5 mg Oral Daily  . multivitamin  1 tablet Oral QHS  . mupirocin ointment  1 application Nasal BID  . pantoprazole  40 mg Oral QAC breakfast  . sevelamer carbonate  1,600 mg Oral TID WC  . sodium chloride  3 mL Intravenous Q12H  . traMADol  100 mg Oral BID  . warfarin  3.75 mg Oral ONCE-1800  . Warfarin - Pharmacist Dosing Inpatient   Does not apply q1800   Continuous Infusions: . sodium chloride 500 mL (02/03/13 0730)   PRN Meds:.sodium chloride, acetaminophen, fluticasone, loratadine, sodium chloride Assessment/Plan: # Balanitis and sepsis: Unable to release  foreskin during exam, perhaps 2/2 edema of foreskin, but also concerned for phimosis or paraphimosis. Symptoms are improving per patient, though I am concerned for permanent damage to penile skin as well as the fact that patient has not urinated (pt on HD, though per pt he does urinate at baseline). VSS, T max overnight 100.3, leukocytosis downtrending. - continue augmentin for a total of 7 days (day 2/7) -GC/Chlamydia, wet prep, UA, UCx, BCx pending -urology consult today  # Skin erosion and ulcerations: Patient has been on Ancef since 12/27. There is significant induration around the lesions, though this is seemingly chronic. The areas on L thigh do not appear infected, no drainage. L Leg CT scans yesterday negative for osteo or abscess. Pt being treated with augmentin for balanits. Skin erosion may be due to chronic volume overload. Renal is attempting to decrease EDW. Concern for aterial origin for these skin changes, so will consult VVS. WOC saw patient yesterday and recommended nonadherent dressings w/ daily dressing changes.  -VVS consult, appreciate recs  # ESRD onHD (T/Thu/S): HD done yesterday. Renal plans to take patient to HD again today with the goal of further decreasing EDW. -HD today  # A Fib: HR stable. He is on amiodarone 200 mg daily at home and Coumadin. INR is 2.2 on admission.  -will continue home meds   # DM-II: A1c 4.9 on 12/13/12, which may not be accurate due to dialysis. Patient is taking Lantus 45 unit at bedtime daily.  -continue Lantus 45 U qHS -SSI   # CHF with EF of 25%: Stable. BW was 318 on 09/25/12-->299 on admission. Euvolemic and receiving regular HD. No concern for acute exacerbation.  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  Anticipated discharge in approximately 2-3 day(s).   The patient does have a current PCP Evelena Peat, DO) and  does need an [facility] hospital follow-up appointment after discharge.  The patient does not have transportation limitations that hinder  transportation to clinic appointments.  .Services Needed at time of discharge: Y = Yes, Blank = No PT:   OT:   RN:   Equipment:   Other:     LOS: 1 day   Windell Hummingbird, MD 02/04/2013, 6:56 AM

## 2013-02-04 NOTE — Progress Notes (Signed)
CMT Sonya notified pt going to HD.

## 2013-02-04 NOTE — Transfer of Care (Signed)
Immediate Anesthesia Transfer of Care Note  Patient: Adam [name]  Procedure(s) Performed: Procedure(s): CIRCUMCISION ADULT (N/A) PENILE DEBRIDEMENT  (N/A) CYSTOSCOPY/URETHROTOMY (N/A) CIRCUMCISION ADULT (N/A) Penile DEBRIDEMENT (N/A)  Patient Location: PACU  Anesthesia Type:General  Level of Consciousness: awake, alert  and patient cooperative  Airway & Oxygen Therapy: Patient Spontanous Breathing and Patient connected to face mask oxygen  Post-op Assessment: Report given to PACU RN, Post -op Vital signs reviewed and stable and Patient moving all extremities  Post vital signs: Reviewed and stable  Complications: No apparent anesthesia complications

## 2013-02-04 NOTE — Anesthesia Postprocedure Evaluation (Signed)
  Anesthesia Post-op Note  Patient: Adam [name]  Procedure(s) Performed: Procedure(s): CIRCUMCISION ADULT (N/A) PENILE DEBRIDEMENT  (N/A) CYSTOSCOPY/URETHROTOMY (N/A) CIRCUMCISION ADULT (N/A) Penile DEBRIDEMENT (N/A)  Patient Location: PACU  Anesthesia Type:General  Level of Consciousness: awake and alert   Airway and Oxygen Therapy: Patient Spontanous Breathing  Post-op Pain: mild  Post-op Assessment: Post-op Vital signs reviewed  Post-op Vital Signs: stable  Complications: No apparent anesthesia complications

## 2013-02-04 NOTE — Progress Notes (Addendum)
Subjective:  Feel better Objective Vital signs in last 24 hours: Filed Vitals:   02/03/13 2002 02/03/13 2006 02/03/13 2056 02/04/13 0437  BP: 122/99 125/77 98/60 84/57   Pulse: 70 116 84 84  Temp:  98.3 F (36.8 C) 99.3 F (37.4 C) 100.3 F (37.9 C)  TempSrc:  Oral Oral Oral  Resp: 18 20 20 22   Weight:  144.3 kg (318 lb 2 oz) 144.3 kg (318 lb 2 oz)   SpO2:  95% 100% 96%   Weight change:   Labs: Basic Metabolic Panel:  Recent Labs Lab 02/03/13 0545  NA 134*  K 4.8  CL 90*  CO2 25  GLUCOSE 114*  BUN 30*  CREATININE 5.75*  CALCIUM 9.3    Recent Labs Lab 02/03/13 0545  AST 9  ALT <5  ALKPHOS 188*  BILITOT 1.6*  PROT 9.1*  ALBUMIN 2.2*    Recent Labs Lab 02/03/13 0545 02/04/13 0352  WBC 15.9* 14.8*  NEUTROABS 12.6*  --   HGB 8.3* 7.2*  HCT 27.0* 22.4*  MCV 90.6 89.6  PLT 478* 403*    Recent Labs Lab 02/03/13 0545  TROPONINI <0.30   CBG:  Recent Labs Lab 01/30/13 1118 02/03/13 1052 02/03/13 1127 02/03/13 2339 02/04/13 0419  GLUCAP 328* 96 102* 146* 132*    Studies/Results: Dg Chest 2 View  02/03/2013   CLINICAL DATA:  Short of breath.  Dialysis patient  EXAM: CHEST  2 VIEW  COMPARISON:  09/10/2012.  FINDINGS: Marked cardiac enlargement without fluid overload. Negative for edema or effusion. Negative for pneumonia. AICD unchanged in position.  IMPRESSION: Marked cardiac enlargement.  No acute abnormality   Electronically Signed   By: Marlan Palau M.D.   On: 02/03/2013 07:06   Ct Pelvis Wo Contrast  02/03/2013   CLINICAL DATA:  Wounds on the left thigh and dorsum of the left foot. Blistering left leg. Elevated white blood cell count.  EXAM: CT TIBIA FIBULA LEFT WITHOUT CONTRAST; CT OF THE LEFT FEMUR WITHOUT CONTRAST; CT PELVIS WITHOUT CONTRAST  TECHNIQUE: Multidetector CT imaging of the pelvis, left femur and left lower leg was performed following the standard protocol without intravenous contrast.  COMPARISON:  None.  FINDINGS: PELVIS:  There is  mild infiltration of subcutaneous fat about the pelvis. No focal fluid collection is identified and there is no fluid collection within the pelvis itself. There is no lymphadenopathy. No bony destructive change or other bony abnormality is identified. No hip joint effusion is seen.  LEFT THIGH:  The right thigh is partially visualized on the axial and coronal images. Cutaneous tissues appear thickened bilaterally. There is also infiltration of subcutaneous fat bilaterally which appears fairly symmetric. No focal fluid collection is identified. Fat planes within muscle are preserved. Multiple varicosities are noted. No muscle or tendon tear is seen. No radiopaque foreign body is identified. There is atherosclerotic vascular disease. No bony destructive change or other focal bony lesion is identified.  LEFT LOWER LEG:  Cutaneous tissues appear thickened about the medial margin of the left lower leg where there appear to be blisters at the skin surface. There is some infiltration of subcutaneous fat about the lower legs bilaterally, worse on the left. Fat planes within muscle are preserved. No focal fluid collection is seen in the subcutaneous or muscular tissues. No bony destructive change or other focal bony lesion is present. All visualized muscles and tendons appear intact.  IMPRESSION: Findings compatible cellulitis and/or dependent change about both legs and the pelvis. Blistering along  the skin surface is seen in the left lower leg. No abscess is identified and there is no CT evidence of osteomyelitis.  Atherosclerosis.   Electronically Signed   By: Drusilla Kanner M.D.   On: 02/03/2013 08:30   Ct Femur Left Wo Contrast  02/03/2013   CLINICAL DATA:  Wounds on the left thigh and dorsum of the left foot. Blistering left leg. Elevated white blood cell count.  EXAM: CT TIBIA FIBULA LEFT WITHOUT CONTRAST; CT OF THE LEFT FEMUR WITHOUT CONTRAST; CT PELVIS WITHOUT CONTRAST  TECHNIQUE: Multidetector CT imaging of the  pelvis, left femur and left lower leg was performed following the standard protocol without intravenous contrast.  COMPARISON:  None.  FINDINGS: PELVIS:  There is mild infiltration of subcutaneous fat about the pelvis. No focal fluid collection is identified and there is no fluid collection within the pelvis itself. There is no lymphadenopathy. No bony destructive change or other bony abnormality is identified. No hip joint effusion is seen.  LEFT THIGH:  The right thigh is partially visualized on the axial and coronal images. Cutaneous tissues appear thickened bilaterally. There is also infiltration of subcutaneous fat bilaterally which appears fairly symmetric. No focal fluid collection is identified. Fat planes within muscle are preserved. Multiple varicosities are noted. No muscle or tendon tear is seen. No radiopaque foreign body is identified. There is atherosclerotic vascular disease. No bony destructive change or other focal bony lesion is identified.  LEFT LOWER LEG:  Cutaneous tissues appear thickened about the medial margin of the left lower leg where there appear to be blisters at the skin surface. There is some infiltration of subcutaneous fat about the lower legs bilaterally, worse on the left. Fat planes within muscle are preserved. No focal fluid collection is seen in the subcutaneous or muscular tissues. No bony destructive change or other focal bony lesion is present. All visualized muscles and tendons appear intact.  IMPRESSION: Findings compatible cellulitis and/or dependent change about both legs and the pelvis. Blistering along the skin surface is seen in the left lower leg. No abscess is identified and there is no CT evidence of osteomyelitis.  Atherosclerosis.   Electronically Signed   By: Drusilla Kanner M.D.   On: 02/03/2013 08:30   Ct Tibia Fibula Left Wo Contrast  02/03/2013   CLINICAL DATA:  Wounds on the left thigh and dorsum of the left foot. Blistering left leg. Elevated white  blood cell count.  EXAM: CT TIBIA FIBULA LEFT WITHOUT CONTRAST; CT OF THE LEFT FEMUR WITHOUT CONTRAST; CT PELVIS WITHOUT CONTRAST  TECHNIQUE: Multidetector CT imaging of the pelvis, left femur and left lower leg was performed following the standard protocol without intravenous contrast.  COMPARISON:  None.  FINDINGS: PELVIS:  There is mild infiltration of subcutaneous fat about the pelvis. No focal fluid collection is identified and there is no fluid collection within the pelvis itself. There is no lymphadenopathy. No bony destructive change or other bony abnormality is identified. No hip joint effusion is seen.  LEFT THIGH:  The right thigh is partially visualized on the axial and coronal images. Cutaneous tissues appear thickened bilaterally. There is also infiltration of subcutaneous fat bilaterally which appears fairly symmetric. No focal fluid collection is identified. Fat planes within muscle are preserved. Multiple varicosities are noted. No muscle or tendon tear is seen. No radiopaque foreign body is identified. There is atherosclerotic vascular disease. No bony destructive change or other focal bony lesion is identified.  LEFT LOWER LEG:  Cutaneous tissues  appear thickened about the medial margin of the left lower leg where there appear to be blisters at the skin surface. There is some infiltration of subcutaneous fat about the lower legs bilaterally, worse on the left. Fat planes within muscle are preserved. No focal fluid collection is seen in the subcutaneous or muscular tissues. No bony destructive change or other focal bony lesion is present. All visualized muscles and tendons appear intact.  IMPRESSION: Findings compatible cellulitis and/or dependent change about both legs and the pelvis. Blistering along the skin surface is seen in the left lower leg. No abscess is identified and there is no CT evidence of osteomyelitis.  Atherosclerosis.   Electronically Signed   By: Drusilla Kannerhomas  Dalessio M.D.   On:  02/03/2013 08:30   Medications: . sodium chloride 500 mL (02/03/13 0730)   . amiodarone  200 mg Oral Daily  . amoxicillin-clavulanate  1 tablet Oral TID  . Chlorhexidine Gluconate Cloth  6 each Topical Q0600  . colchicine  0.6 mg Oral Q3 days  . darbepoetin (ARANESP) injection - DIALYSIS  200 mcg Intravenous Q Thu-HD  . diclofenac sodium  2 g Topical QID  . doxercalciferol  6 mcg Intravenous Q T,Th,Sa-HD  . Febuxostat  1 tablet Oral Daily  . ferric gluconate (FERRLECIT/NULECIT) IV  125 mg Intravenous Q T,Th,Sa-HD  . insulin aspart  0-9 Units Subcutaneous Q4H  . insulin glargine  45 Units Subcutaneous QHS  . LORazepam  0.5 mg Oral Daily  . multivitamin  1 tablet Oral QHS  . mupirocin ointment  1 application Nasal BID  . pantoprazole  40 mg Oral QAC breakfast  . sevelamer carbonate  1,600 mg Oral TID WC  . sodium chloride  3 mL Intravenous Q12H  . traMADol  100 mg Oral BID  . warfarin  3.75 mg Oral ONCE-1800  . Warfarin - Pharmacist Dosing Inpatient   Does not apply q1800   Physical Exam: General: Alert, NAD Sitting up in chair Heart: Tachy reg , no rub Lungs: CTA bolat Abdomen: Obese, soft nt/ Extremities: Dialysis Access: pos. Bruit  R FA AVF/ Lower extremities= 3+ edema left thigh wrapped; with  blisters on left LE and  blisters on foot; skin on LE very dark    Dialysis Orders: [facility] 4[facility]75 hours Optiflux 180 550/A 1.5 -4K 2.25 Ca no var NA or profile EDW 143.5 Access -right lower AVF; NO HEPARIN Epo 22,000 no Fe Hectorol 6  Recent labs: Hgb 8.9 12/29, 8.6 11/25 - on 22,000 Epo over a month; tsat 13% 12/18 got venofer 12/29 - ferritin 62 in October; iPTH 157 12/18 improved with normal Ca and P   Assessment/Plan:  1. Left thigh and foot wounds unclear etiology with cellulitis -  seen by Wound Care RN who has requested vascular evaluation; s/p vanco and zosyn, now appears is only on Augmentin  2. Penile wound - antibiotics as above with recent repair of lac of foreskin;  cultures pending 3. 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 BP/volume - usual gains 3 - 5 kg; BP84/67 and as op generally 90s - 100s - and gets to EDW; has excess volume and need to low BP and  maybe low Hgb interfere with volume removal; Would try for aggressive volume removal while here with serial HD and extra treatment TODAY= Friday for ultrafiltration. Pt unable to stand for weights Increase goal today to 5 liters. 5. Anemia - chronically low Hgb in the 8s for several months - admit 8.3 .7.2 today reck pre hd/ on 22,000 units Epo without response - giving Aranesp 200 here; Fe low - 13% sat - last venofer given 12/29 -  Fe studies yesterday including ferritin b/c would excpect an increase due to thigh wounds; have already put in order to replete Fe but can be changed if needed; likely has EPO resistance due to inflammation 6. Metabolic bone disease - Controlled with current hectorol and add renvela - takes 2 ac (though not on home med list) 7. 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 Atrial fibrillation - on amio and coumadin  10. 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 Hx gout - on cochicine; also on Febuoxostat - has not been studied in pts with ESRD 12. DM - Lantus + SSI per primary 13. NICM - last Echo was 12/2011 with EF 25%; massive CM on CXR - may be worse and contributory to low BP and surely edema 14. OSA - uses nocturnal O2 but not CPAP   Lenny Pastel PA-C [facility] Beeper [phone] 02/04/2013,8:03 AM  LOS: 1 day  I have seen and examined this patient and agree with plan as noted above.  Cellulitis vs UTI/balanitis. Seems like a rather quick  transition from IV to po ATB's if he was truly felt to be "septic" from one of those 2 sources.  Planning for additional HD today - would like to take volume down if we can given the LE edema/ulcers - BP will be the determining factor.  Leane Loring B,MD 02/04/2013 12:28 PM

## 2013-02-04 NOTE — Preoperative (Signed)
Beta Blockers   Reason not to administer Beta Blockers:Not Applicable 

## 2013-02-04 NOTE — H&P (Signed)
  Date: 02/04/2013  Patient name: ELIZIAH UMBERGER  Medical record number: [id number]  Date of birth: 17-Jan-1966   I have seen and evaluated Domenic Schwab and discussed their care with the Residency Team.  Briefly, Mr. Zavala is a 48yo man with PMH of ESRD, DM2, gout, Afib on coumadin, CHF who presents with penis pain and discharge along with left leg pain.  He noted penile discharge, clear, about 2 weeks ago with itching.  From scratching, he received a small laceration on the penis last Sunday which was treated in the ED.  Since that time the discharge and pain have increased.  He has an elevated WBC and temp of 100.3 along with low BPs (however, this is chronic).  He further has retraction of the foreskin on the dorsal side of the penis and white discoloration to the head of the penis.  His foreskin is not able to be retracted from the head of the penis at this time.  He further reports chronic leg swelling, skin changes and left foot ulcer along with new left medial knee wounds.    Assessment and Plan: I have seen and evaluated the patient as outlined above. I agree with the formulated Assessment and Plan as detailed in the residents' admission note, with the following changes:   1. Balanitis with possible sepsis: Patient received a dose of vancomycin and zosyn in the ED.  On the floor, he will get empirically a dose of fluconazole 150mg  and Augmentin.  We will consult Urology for foreskin retraction and inability to move past head of penis.  Will check GC/Chlamydia, Wet pre, UA (he makes some urine), UC and blood culture.   2. Leg wounds: wound care consult, augmentin as above  3. ESRD: consult nephrology for dialysis while here.   Other issues per resident note.   Inez Catalina, MD 1/9/20155:16 PM

## 2013-02-04 NOTE — Consult Note (Signed)
Noted vascular evaluation 02/04/13, feels ulcers related to edema and would be best treated with Unna's boots application.  With weekend approaching would be cautious to wrap patient without close monitoring of changes in the extremity due the amount of tenderness and hardness of the upper thigh.  Would recommend follow up at the time of discharge with a wound care center for full work up and treatment. Continue non adherent dressings to the upper left thigh wounds.   Shermon Bozzi Markle RN,CWOCN [phone]

## 2013-02-04 NOTE — Progress Notes (Signed)
Visit to patient while in hospital. . He was in dialysis so left business card. Will call after patient is discharged to assist with transition of care.

## 2013-02-04 NOTE — Progress Notes (Addendum)
ANTICOAGULATION CONSULT NOTE   Pharmacy Consult for Coumadin Indication: atrial fibrillation  Allergies  Allergen Reactions  . Argatroban Other (See Comments)    Ventricular tachycardia  . Ace Inhibitors     Acute renal failure with multiple trials  . Heparin     Thrombcytopenia. SRA positive. See miscellaneous test (SRA results)    Patient Measurements: Weight: 318 lb 2 oz (144.3 kg)  Vital Signs: Temp: 100.3 F (37.9 C) (01/09 0437) Temp src: Oral (01/09 0437) BP: 84/57 mmHg (01/09 0437) Pulse Rate: 84 (01/09 0437)  Labs:  Recent Labs  02/03/13 0545 02/04/13 0352  HGB 8.3* 7.2*  HCT 27.0* 22.4*  PLT 478* 403*  LABPROT 23.7*  --   INR 2.20*  --   CREATININE 5.75*  --   TROPONINI <0.30  --     The CrCl is unknown because both a height and weight (above a minimum accepted value) are required for this calculation.   Medical History: Past Medical History  Diagnosis Date  . Other and unspecified hyperlipidemia   . Insomnia, unspecified   . Obstructive sleep apnea (adult) (pediatric)   . Allergic rhinitis, cause unspecified   . Nonischemic cardiomyopathy     s/p St. Jude ICD  . Unspecified essential hypertension   . Type II or unspecified type diabetes mellitus without mention of complication, not stated as uncontrolled   . CKD (chronic kidney disease) stage 3, GFR 30-59 ml/min   . Esophageal reflux   . Morbid obesity   . Dysuria   . Tobacco use disorder   . Dysrhythmia   . Pacemaker   . Antral ulcer   . Renal azotemia   . Arthritis     Gout w/hyperuricemia  . Morbid obesity   . Shortness of breath   . Automatic implantable cardioverter-defibrillator in situ     Medications:  See electronic med rec  Assessment: 48 y.o. male presents with penis pain/discharge and leg pain. Pt on coumadin PTA for afib. Baseline INR therapeutic on admission. Hgb low but pt's baseline ~8 over past 5 months. No bleeding noted.  Home dose: 3.75mg  po daily except for  2.5mg  on Mon  Goal of Therapy:  INR 2-3 Monitor platelets by anticoagulation protocol: Yes   Plan:  1. Coumadin 3.75mg  tonight 2. Daily PT/INR  Thank you. Okey Regal, PharmD [phone]  02/04/2013,9:38 AM

## 2013-02-05 DIAGNOSIS — I959 Hypotension, unspecified: Secondary | ICD-10-CM

## 2013-02-05 DIAGNOSIS — L988 Other specified disorders of the skin and subcutaneous tissue: Secondary | ICD-10-CM

## 2013-02-05 LAB — CBC WITH DIFFERENTIAL/PLATELET
BASOS PCT: 0 % (ref 0–1)
Basophils Absolute: 0 10*3/uL (ref 0.0–0.1)
Eosinophils Absolute: 0.1 10*3/uL (ref 0.0–0.7)
Eosinophils Relative: 1 % (ref 0–5)
HCT: 21.3 % — ABNORMAL LOW (ref 39.0–52.0)
Hemoglobin: 6.7 g/dL — CL (ref 13.0–17.0)
Lymphocytes Relative: 9 % — ABNORMAL LOW (ref 12–46)
Lymphs Abs: 1.3 10*3/uL (ref 0.7–4.0)
MCH: 28.4 pg (ref 26.0–34.0)
MCHC: 31.5 g/dL (ref 30.0–36.0)
MCV: 90.3 fL (ref 78.0–100.0)
Monocytes Absolute: 1.6 10*3/uL — ABNORMAL HIGH (ref 0.1–1.0)
Monocytes Relative: 11 % (ref 3–12)
NEUTROS ABS: 11.1 10*3/uL — AB (ref 1.7–7.7)
Neutrophils Relative %: 79 % — ABNORMAL HIGH (ref 43–77)
PLATELETS: 408 10*3/uL — AB (ref 150–400)
RBC: 2.36 MIL/uL — ABNORMAL LOW (ref 4.22–5.81)
RDW: 16.4 % — AB (ref 11.5–15.5)
WBC: 14.1 10*3/uL — AB (ref 4.0–10.5)

## 2013-02-05 LAB — CBC
HEMATOCRIT: 24.4 % — AB (ref 39.0–52.0)
Hemoglobin: 7.7 g/dL — ABNORMAL LOW (ref 13.0–17.0)
MCH: 27.8 pg (ref 26.0–34.0)
MCHC: 31.6 g/dL (ref 30.0–36.0)
MCV: 88.1 fL (ref 78.0–100.0)
Platelets: 378 10*3/uL (ref 150–400)
RBC: 2.77 MIL/uL — AB (ref 4.22–5.81)
RDW: 16.6 % — ABNORMAL HIGH (ref 11.5–15.5)
WBC: 18.2 10*3/uL — AB (ref 4.0–10.5)

## 2013-02-05 LAB — GLUCOSE, CAPILLARY
GLUCOSE-CAPILLARY: 138 mg/dL — AB (ref 70–99)
GLUCOSE-CAPILLARY: 165 mg/dL — AB (ref 70–99)
Glucose-Capillary: 132 mg/dL — ABNORMAL HIGH (ref 70–99)
Glucose-Capillary: 103 mg/dL — ABNORMAL HIGH (ref 70–99)
Glucose-Capillary: 162 mg/dL — ABNORMAL HIGH (ref 70–99)

## 2013-02-05 LAB — RENAL FUNCTION PANEL
ALBUMIN: 1.8 g/dL — AB (ref 3.5–5.2)
BUN: 15 mg/dL (ref 6–23)
CHLORIDE: 93 meq/L — AB (ref 96–112)
CO2: 27 mEq/L (ref 19–32)
Calcium: 8.4 mg/dL (ref 8.4–10.5)
Creatinine, Ser: 3.32 mg/dL — ABNORMAL HIGH (ref 0.50–1.35)
GFR, EST AFRICAN AMERICAN: 24 mL/min — AB (ref >90)
GFR, EST NON AFRICAN AMERICAN: 20 mL/min — AB (ref >90)
Glucose, Bld: 117 mg/dL — ABNORMAL HIGH (ref 70–99)
Phosphorus: 3.6 mg/dL (ref 2.3–4.6)
Potassium: 3.6 mEq/L — ABNORMAL LOW (ref 3.7–5.3)
SODIUM: 135 meq/L — AB (ref 137–147)

## 2013-02-05 LAB — PROTIME-INR
INR: 1.63 — AB (ref 0.00–1.49)
Prothrombin Time: 18.9 seconds — ABNORMAL HIGH (ref 11.6–15.2)

## 2013-02-05 LAB — PREPARE RBC (CROSSMATCH)

## 2013-02-05 LAB — MAGNESIUM: MAGNESIUM: 1.8 mg/dL (ref 1.5–2.5)

## 2013-02-05 MED ORDER — PIPERACILLIN-TAZOBACTAM 3.375 G IVPB
3.3750 g | Freq: Three times a day (TID) | INTRAVENOUS | Status: DC
  Administered 2013-02-06: 07:00:00 3.375 g via INTRAVENOUS
  Filled 2013-02-05 (×3): qty 50

## 2013-02-05 MED ORDER — ALBUMIN HUMAN 25 % IV SOLN
12.5000 g | Freq: Once | INTRAVENOUS | Status: AC
  Administered 2013-02-05: 12.5 g via INTRAVENOUS

## 2013-02-05 MED ORDER — VANCOMYCIN HCL 10 G IV SOLR
2500.0000 mg | Freq: Once | INTRAVENOUS | Status: AC
  Administered 2013-02-05: 2500 mg via INTRAVENOUS
  Filled 2013-02-05: qty 2500

## 2013-02-05 MED ORDER — VANCOMYCIN HCL 10 G IV SOLR
1750.0000 mg | INTRAVENOUS | Status: DC
  Filled 2013-02-05: qty 1750

## 2013-02-05 MED ORDER — CINACALCET HCL 30 MG PO TABS
30.0000 mg | ORAL_TABLET | Freq: Every day | ORAL | Status: DC
Start: 2013-02-05 — End: 2013-02-09
  Administered 2013-02-05 – 2013-02-09 (×5): 30 mg via ORAL
  Filled 2013-02-05 (×7): qty 1

## 2013-02-05 MED ORDER — WARFARIN SODIUM 5 MG PO TABS
5.0000 mg | ORAL_TABLET | Freq: Once | ORAL | Status: AC
  Administered 2013-02-05: 5 mg via ORAL
  Filled 2013-02-05: qty 1

## 2013-02-05 MED ORDER — ALBUMIN HUMAN 25 % IV SOLN
INTRAVENOUS | Status: AC
  Filled 2013-02-05: qty 50

## 2013-02-05 NOTE — Consult Note (Signed)
Subjective: Post op circumcision and penile glans debridement last night. Dressing changed today ( MRSA precautions). Glans still has area of pending necrosis 1cm x 1cm, L mid glans.   Objective: Vital signs in last 24 hours: Temp:  [97.5 F (36.4 C)-99.7 F (37.6 C)] 98.3 F (36.8 C) (01/10 0810) Pulse Rate:  [77-127] 89 (01/10 0930) Resp:  [15-27] 24 (01/10 0510) BP: (72-110)/(43-82) 93/62 mmHg (01/10 0930) SpO2:  [90 %-99 %] 94 % (01/10 0510) Weight:  [140.7 kg (310 lb 3 oz)-147.2 kg (324 lb 8.3 oz)] 142.2 kg (313 lb 7.9 oz) (01/10 0810)A  Intake/Output from previous day: 01/09 0701 - 01/10 0700 In: 1370 [P.O.:1120; I.V.:250] Out: 2580 [Urine:125; Blood:5] Intake/Output this shift: Total I/O In: 120 [P.O.:120] Out: -   Past Medical History  Diagnosis Date  . Other and unspecified hyperlipidemia   . Insomnia, unspecified   . Obstructive sleep apnea (adult) (pediatric)   . Allergic rhinitis, cause unspecified   . Nonischemic cardiomyopathy     s/p St. Jude ICD  . Unspecified essential hypertension   . Type II or unspecified type diabetes mellitus without mention of complication, not stated as uncontrolled   . CKD (chronic kidney disease) stage 3, GFR 30-59 ml/min   . Esophageal reflux   . Morbid obesity   . Dysuria   . Tobacco use disorder   . Dysrhythmia   . Pacemaker   . Antral ulcer   . Renal azotemia   . Arthritis     Gout w/hyperuricemia  . Morbid obesity   . Shortness of breath   . Automatic implantable cardioverter-defibrillator in situ     Physical Exam:  Lungs - Normal respiratory effort, chest expands symmetrically.  Abdomen - Soft, non-tender & non-distended. Penis: pink corpora, and dressing changed. No more fasciitis, and no more necrotic odor, but area of concern in glans as noted above.  Lab Results:  Recent Labs  02/03/13 0545 02/04/13 0352 02/05/13 0430  WBC 15.9* 14.8* 14.1*  HGB 8.3* 7.2* 6.7*  HCT 27.0* 22.4* 21.3*    BMET  Recent Labs  02/03/13 0545 02/05/13 0430  NA 134* 135*  K 4.8 3.6*  CL 90* 93*  CO2 25 27  GLUCOSE 114* 117*  BUN 30* 15  CREATININE 5.75* 3.32*  CALCIUM 9.3 8.4   No results found for this basename: LABURIN,  in the last 72 hours Results for orders placed during the hospital encounter of 02/03/13  CULTURE, BLOOD (ROUTINE X 2)     Status: None   Collection Time    02/03/13  5:45 AM      Result Value Range Status   Specimen Description BLOOD ARM LEFT   Final   Special Requests BOTTLES DRAWN AEROBIC AND ANAEROBIC 10CC   Final   Culture  Setup Time     Final   Value: 02/03/2013 13:55     [facility]   Culture     Final   Value:        BLOOD CULTURE RECEIVED NO GROWTH TO DATE CULTURE WILL BE HELD FOR 5 DAYS BEFORE ISSUING A FINAL NEGATIVE REPORT     Performed at [facility]   Report Status PENDING   Incomplete  CULTURE, BLOOD (ROUTINE X 2)     Status: None   Collection Time    02/03/13  5:52 AM      Result Value Range Status   Specimen Description BLOOD HAND LEFT   Final   Special  Requests BOTTLES DRAWN AEROBIC ONLY 8CC   Final   Culture  Setup Time     Final   Value: 02/03/2013 13:54     Performed at [facility]   Culture     Final   Value:        BLOOD CULTURE RECEIVED NO GROWTH TO DATE CULTURE WILL BE HELD FOR 5 DAYS BEFORE ISSUING A FINAL NEGATIVE REPORT     Performed at [facility]   Report Status PENDING   Incomplete  MRSA PCR SCREENING     Status: Abnormal   Collection Time    02/03/13 12:07 PM      Result Value Range Status   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 It is not     intended to diagnose MRSA     infection nor to guide or     monitor treatment for     MRSA infections.     RESULT CALLED TO, READ BACK BY AND  VERIFIED WITH:     A. BRAKE RN 14:30 02/03/13 ([email or web address])  WOUND CULTURE     Status: None   Collection Time    02/04/13 10:38 AM      Result Value Range Status   Specimen Description WOUND PENIS   Final   Special Requests NONE   Final   Gram Stain PENDING   Incomplete   Culture     Final   Value: Culture reincubated for better growth     Performed at [facility]   Report Status PENDING   Incomplete    Studies/Results: @[id number] @  Assessment: Dressing changed in dialysis today. Foley moved and taped to abdomen wall.  Plan: continue to observe the area of cocern on L mid glans.   Deema Juncaj I 02/05/2013, 10:18 AM

## 2013-02-05 NOTE — Progress Notes (Signed)
Subjective:  POD #1 s/p circumcision, debridement of penile shaft skin due to gangrenous changes Having some pain Falls asleep during exam BP's remain low (70's) and drop in Hb to 6.7 (For PRBC's on HD) Urol raised question of calciphylaxis of the penis (noted calcification of penile vessels on pelvic CT) In dialysis today for regular treatment (had extra treatment yesterday in attempt to remove volume - low BP's precluded  Objective:   Vital signs in last 24 hours: Filed Vitals:   02/05/13 0025 02/05/13 0300 02/05/13 0510 02/05/13 0810  BP: 85/50 81/47 80/45  73/43  Pulse: 89 88 80 84  Temp: 99.7 F (37.6 C) 98.6 F (37 C) 98.5 F (36.9 C) 98.3 F (36.8 C)  TempSrc: Oral Oral Oral Oral  Resp: 17 20 24    Weight:   140.7 kg (310 lb 3 oz) 142.2 kg (313 lb 7.9 oz)  SpO2: 90% 92% 94%    Weight change: 2.9 kg (6 lb 6.3 oz)  Intake/Output Summary (Last 24 hours) at 02/05/13 0826 Last data filed at 02/04/13 2300  Gross per 24 hour  Intake   1130 ml  Output   2580 ml  Net  -1450 ml    Physical Exam:  Blood pressure 73/43, pulse 84, temperature 98.3 F (36.8 C), temperature source Oral, resp. rate 24, weight 142.2 kg (313 lb 7.9 oz), SpO2 94.00%. General: Alert, NAD Sitting up in chair  Heart: Tachy reg , no rub  Lungs: CTA bolat  Abdomen: Obese, soft nt/  Extremities: Dialysis Access: pos. Bruit R FA AVF/ Lower extremities= 3+ edema left thigh wrapped; with blisters on left LE and blisters on foot; skin on LE very dark   Labs:   Recent Labs Lab 02/03/13 0545  NA 134*  K 4.8  CL 90*  CO2 25  GLUCOSE 114*  BUN 30*  CREATININE 5.75*  CALCIUM 9.3     Recent Labs Lab 02/03/13 0545  AST 9  ALT <5  ALKPHOS 188*  BILITOT 1.6*  PROT 9.1*  ALBUMIN 2.2*    Recent Labs Lab 02/03/13 0545 02/04/13 0352 02/05/13 0430  WBC 15.9* 14.8* 14.1*  NEUTROABS 12.6*  --  11.1*  HGB 8.3* 7.2* 6.7*  HCT 27.0* 22.4* 21.3*  MCV 90.6 89.6 90.3  PLT 478* 403* 408*   Recent  Labs Lab 02/03/13 0545  TROPONINI <0.30     Recent Labs Lab 02/04/13 0746 02/04/13 1139 02/04/13 2008 02/05/13 0352 02/05/13 0741  GLUCAP 84 118* 114* 132* 103*      Recent Labs Lab 02/03/13 1525  IRON 56  TIBC 189*  FERRITIN 275   Scheduled Medications  . amiodarone  200 mg Oral Daily  . amoxicillin-clavulanate  1 tablet Oral TID  . ceFAZolin  3 g Intravenous Once  . Chlorhexidine Gluconate Cloth  6 each Topical Q0600  . colchicine  0.6 mg Oral Q3 days  . darbepoetin (ARANESP) injection - DIALYSIS  200 mcg Intravenous Q Thu-HD  . diclofenac sodium  2 g Topical QID  . doxercalciferol  6 mcg Intravenous Q T,Th,Sa-HD  . Febuxostat  1 tablet Oral Daily  . ferric gluconate (FERRLECIT/NULECIT) IV  125 mg Intravenous Q T,Th,Sa-HD  . HYDROmorphone      . HYDROmorphone      . insulin aspart  0-9 Units Subcutaneous Q4H  . insulin glargine  45 Units Subcutaneous QHS  . LORazepam  0.5 mg Oral Daily  . multivitamin  1 tablet Oral QHS  . mupirocin ointment  1 application  Nasal BID  . pantoprazole  40 mg Oral QAC breakfast  . sevelamer carbonate  1,600 mg Oral TID WC  . traMADol  100 mg Oral BID  . Warfarin - Pharmacist Dosing Inpatient   Does not apply q1800    Dialysis Orders: [facility] 4.75 hours Optiflux 180 550/A 1.5 -4K 2.25 Ca no var NA or profile EDW 143.5 Access -right lower AVF; NO HEPARIN Epo 22,000 no Fe Hectorol 6  Recent labs: Hgb 8.9 12/29, 8.6 11/25 - on 22,000 Epo over a month; tsat 13% 12/18 got venofer 12/29 - ferritin 62 in October; iPTH 157 12/18 improved with normal Ca and P   ASSESSMENT/RECOMMENDATIONS  1. Left thigh and foot wounds with cellulitis - seen by Wound Care RN who has requested vascular evaluation; s/p vanco and zosyn, now is only on Augmentin  2. Penile wound with gangrenous change - s/p circumcision, debridement of penile shaft skin due to gangrenous changes   On po Augmentin  Urology raised question of possibility of calciphylaxis  of the penis.  Certainly cannot exclude.  Last PTH was 157 but IS on Vit D.  Cannot exclude this as etiology - in which case we need to stop doxercalciferol and start sensipar; already on non-calcium based phosphate binder. Orders written. 3. 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 Low BP is impeding volume removal 4. BP/volume - usual gains 3 - 5 kg; BP84/67 and as op generally 90s - 100s - much lower during this hosp;  Trying for more for aggressive volume removal while here with serial HD but as above BP precludes 5. Anemia - chronically low Hgb in the 8s for several months  - on Aranesp 200 here; Fe low - 13% sat - last venofer given 12/29 -  EPO resistance due to inflammation; transfuse 2 units on HD today for Hb 6.7 6. Metabolic bone disease - Controlled with current hectorol and on renvela - now with question of calciphylaxis will change to sensipar 7. 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 Atrial fibrillation - on amio and coumadin  10. 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 Hx gout - on cochicine; also on Febuoxostat - has not been studied in pts with ESRD 12. DM - Lantus + SSI per primary 13. NICM - last Echo was 12/2011 with EF 25%; massive CM on CXR - may be worse and contributory to low BP and surely edema 14. OSA - uses nocturnal O2 but not CPAP   Camille Balynthia Nyja Westbrook, MD [facility] [phone] Pager 02/05/2013, 8:26 AM

## 2013-02-05 NOTE — Progress Notes (Signed)
ANTICOAGULATION CONSULT NOTE - Initial Consult  Pharmacy Consult for Coumadin Indication: atrial fibrillation  Allergies  Allergen Reactions  . Argatroban Other (See Comments)    Ventricular tachycardia  . Ace Inhibitors     Acute renal failure with multiple trials  . Heparin     Thrombcytopenia. SRA positive. See miscellaneous test (SRA results)    Patient Measurements: Weight: 313 lb 7.9 oz (142.2 kg)  Vital Signs: Temp: 97.6 F (36.4 C) (01/10 1130) Temp src: Oral (01/10 1130) BP: 101/54 mmHg (01/10 1130) Pulse Rate: 83 (01/10 1130)  Labs:  Recent Labs  02/03/13 0545 02/04/13 0352 02/05/13 0430  HGB 8.3* 7.2* 6.7*  HCT 27.0* 22.4* 21.3*  PLT 478* 403* 408*  LABPROT 23.7*  --  18.9*  INR 2.20*  --  1.63*  CREATININE 5.75*  --  3.32*  TROPONINI <0.30  --   --     The CrCl is unknown because both a height and weight (above a minimum accepted value) are required for this calculation.   Medical History: Past Medical History  Diagnosis Date  . Other and unspecified hyperlipidemia   . Insomnia, unspecified   . Obstructive sleep apnea (adult) (pediatric)   . Allergic rhinitis, cause unspecified   . Nonischemic cardiomyopathy     s/p St. Jude ICD  . Unspecified essential hypertension   . Type II or unspecified type diabetes mellitus without mention of complication, not stated as uncontrolled   . CKD (chronic kidney disease) stage 3, GFR 30-59 ml/min   . Esophageal reflux   . Morbid obesity   . Dysuria   . Tobacco use disorder   . Dysrhythmia   . Pacemaker   . Antral ulcer   . Renal azotemia   . Arthritis     Gout w/hyperuricemia  . Morbid obesity   . Shortness of breath   . Automatic implantable cardioverter-defibrillator in situ     Medications:  See electronic med rec  Assessment: 48 y.o. male presents with penis pain/discharge and leg pain now s/p circumcision and debridement of the penile shaft d/t gangrene. Pt on coumadin PTA  for afib (Home  dose: 3.75mg  po daily except for 2.5mg  on Mon). Baseline INR therapeutic 2.20 but has not received any doses since being in the hospital.  INR this AM 1.63 subtherapeutic.   Hgb low but pt's baseline ~8 over past 5 months. 6.7 this AM - to receive 2 U PRBCs. No bleeding noted.  Goal of Therapy:  INR 2-3 Monitor platelets by anticoagulation protocol: Yes   Plan:  Patient has not received coumadin since being in hospital. INR now subtherapeutic.  Will give 5mg  Coumadin x 1 tonight before resuming home dose Daily PT/INR  Nilsa Macht B. Artelia Laroche, PharmD Clinical Pharmacist - Resident Pager: [phone] Phone: [phone] 02/05/2013 11:37 AM

## 2013-02-05 NOTE — Progress Notes (Signed)
Dressing to left leg changed

## 2013-02-05 NOTE — Progress Notes (Signed)
ANTIBIOTIC CONSULT NOTE - INITIAL  Pharmacy Consult for Vancomycin, Zosyn Indication: Penile cellulitis/necrosis, S/p I&D, sepsis  Allergies  Allergen Reactions  . Argatroban Other (See Comments)    Ventricular tachycardia  . Ace Inhibitors     Acute renal failure with multiple trials  . Heparin     Thrombcytopenia. SRA positive. See miscellaneous test (SRA results)    Patient Measurements: Height: 6' (182.9 cm) Weight: 306 lb 10.6 oz (139.1 kg) IBW/kg (Calculated) : 77.6 Adjusted Body Weight: n/a  Vital Signs: Temp: 98.2 F (36.8 C) (01/10 1504) Temp src: Oral (01/10 1504) BP: 99/54 mmHg (01/10 1504) Pulse Rate: 106 (01/10 1504) Intake/Output from previous day: 01/09 0701 - 01/10 0700 In: 1370 [P.O.:1120; I.V.:250] Out: 2580 [Urine:125; Blood:5] Intake/Output from this shift: Total I/O In: 805 [P.O.:480; Blood:325] Out: 3875 [Other:3875]  Labs:  Recent Labs  02/03/13 0545 02/04/13 0352 02/05/13 0430  WBC 15.9* 14.8* 14.1*  HGB 8.3* 7.2* 6.7*  PLT 478* 403* 408*  CREATININE 5.75*  --  3.32*   Estimated Creatinine Clearance: 39.3 ml/min (by C-G formula based on Cr of 3.32). No results found for this basename: VANCOTROUGH, Leodis Binet, VANCORANDOM, GENTTROUGH, GENTPEAK, GENTRANDOM, TOBRATROUGH, TOBRAPEAK, TOBRARND, AMIKACINPEAK, AMIKACINTROU, AMIKACIN,  in the last 72 hours   Microbiology: Recent Results (from the past 720 hour(s))  CULTURE, BLOOD (ROUTINE X 2)     Status: None   Collection Time    02/03/13  5:45 AM      Result Value Range Status   Specimen Description BLOOD ARM LEFT   Final   Special Requests BOTTLES DRAWN AEROBIC AND ANAEROBIC 10CC   Final   Culture  Setup Time     Final   Value: 02/03/2013 13:55     Performed at [facility]   Culture     Final   Value:        BLOOD CULTURE RECEIVED NO GROWTH TO DATE CULTURE WILL BE HELD FOR 5 DAYS BEFORE ISSUING A FINAL NEGATIVE REPORT     Performed at [facility]   Report Status  PENDING   Incomplete  CULTURE, BLOOD (ROUTINE X 2)     Status: None   Collection Time    02/03/13  5:52 AM      Result Value Range Status   Specimen Description BLOOD HAND LEFT   Final   Special Requests BOTTLES DRAWN AEROBIC ONLY 8CC   Final   Culture  Setup Time     Final   Value: 02/03/2013 13:54     Performed at [facility]   Culture     Final   Value:        BLOOD CULTURE RECEIVED NO GROWTH TO DATE CULTURE WILL BE HELD FOR 5 DAYS BEFORE ISSUING A FINAL NEGATIVE REPORT     Performed at [facility]   Report Status PENDING   Incomplete  MRSA PCR SCREENING     Status: Abnormal   Collection Time    02/03/13 12:07 PM      Result Value Range Status   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 It is not     intended to diagnose MRSA     infection nor to  guide or     monitor treatment for     MRSA infections.     RESULT CALLED TO, READ BACK BY AND VERIFIED WITH:     A. BRAKE RN 14:30 02/03/13 ([email or web address])  WOUND CULTURE     Status: None   Collection Time    02/04/13 10:38 AM      Result Value Range Status   Specimen Description WOUND PENIS   Final   Special Requests NONE   Final   Gram Stain     Final   Value: FEW WBC PRESENT,BOTH PMN AND MONONUCLEAR     RARE SQUAMOUS EPITHELIAL CELLS PRESENT     MODERATE GRAM NEGATIVE RODS     MODERATE GRAM POSITIVE COCCI IN PAIRS     [facility]   Culture     Final   Value: Culture reincubated for better growth     [facility]formed at [facility]   Report Status PENDING   Incomplete    Medical History: Past Medical History  Diagnosis Date  . Other and unspecified hyperlipidemia   . Insomnia, unspecified   . Obstructive sleep apnea (adult) (pediatric)   . Allergic rhinitis, cause unspecified   . Nonischemic  cardiomyopathy     s/p St. Jude ICD  . Unspecified essential hypertension   . Type II or unspecified type diabetes mellitus without mention of complication, not stated as uncontrolled   . CKD (chronic kidney disease) stage 3, GFR 30-59 ml/min   . Esophageal reflux   . Morbid obesity   . Dysuria   . Tobacco use disorder   . Dysrhythmia   . Pacemaker   . Antral ulcer   . Renal azotemia   . Arthritis     Gout w/hyperuricemia  . Morbid obesity   . Shortness of breath   . Automatic implantable cardioverter-defibrillator in situ     Medications:  Prescriptions prior to admission  Medication Sig Dispense Refill  . acetaminophen (TYLENOL) 500 MG tablet Take 500 mg by mouth every 6 (six) hours as needed.      Marland Kitchen. amiodarone (PACERONE) 200 MG tablet Take 200 mg by mouth daily.      . colchicine 0.6 MG tablet Take 1 tablet (0.6 mg total) by mouth See admin instructions. Takes every three days.  15 tablet  2  . diclofenac sodium (VOLTAREN) 1 % GEL Apply 2 g topically 4 (four) times daily.  1 Tube  2  . esomeprazole (NEXIUM) 20 MG capsule Take 20 mg by mouth daily at 12 noon. TAKE ONE CAPSULE EVERY DAY BEFORE BREAKFAST      . Febuxostat (ULORIC) 80 MG TABS Take 1 tablet (80 mg total) by mouth daily.  30 tablet  5  . fluticasone (FLONASE) 50 MCG/ACT nasal spray Place 1 spray into both nostrils daily as needed for allergies. For allergies  16 g  2  . Insulin Glargine (LANTUS SOLOSTAR) 100 UNIT/ML Solostar Pen Inject 45 Units into the skin daily at 10 pm.       . loratadine (CLARITIN) 10 MG tablet Take 10 mg by mouth daily as needed for allergies.      Marland Kitchen. LORazepam (ATIVAN) 0.5 MG tablet Take 0.5 mg by mouth daily. TAKE 1 TABLET TWICE A DAY AS NEEDED      . traMADol (ULTRAM) 50 MG tablet Take 100 mg by mouth 2 (two) times daily.      Marland Kitchen. warfarin (COUMADIN) 2.5 MG tablet Take 2.5 mg by mouth  daily. Takes 1 and half tab every day except on Monday. Monday takes just 2.5 mg.       Assessment: 47 YOM  with necrosis of distal penile shaft skin and foreskin -- s/p circumcision and debridement of skin on 1/9 by urology. He became septic with hypotension and leukocytosis. He was on Augmentin but broadening coverage given severity of infection. WBC 14.1, CrCl~ 39 mL/min (improved from 1/8). MRSA PCR positive  Augmentin 1/8>>1/10 Vanc 1/10>> Zosyn 1/10>>  1/9: Wound Cx>> 1/8: Blood Cx x2>>  Goal of Therapy:  Vancomycin trough level 10-15 mcg/ml  Plan:  1) Give Vancomycin 2500 mg IV load, followed by 1750 mg IV Q 24 hours  2) Zosyn 3.375 gm IV Q 8 hours  3) F/u CBC, renal fx, and patient clinical status 4) Collect vanc trough at steady state.   Vinnie Level, PharmD.  Clinical Pharmacist Pager [phone]

## 2013-02-05 NOTE — Progress Notes (Signed)
Subjective: [name] was seen and examined at bedside this morning when he was in HD.  He is s/p circumcision and debridement last night.  He was tearful and concerned about his penile infection and if he will be able to have sex again.  He said he discussed his concerns with urology as well who he says were hopeful in terms of functionality.   Objective: Vital signs in last 24 hours: Filed Vitals:   02/05/13 1145 02/05/13 1200 02/05/13 1230 02/05/13 1504  BP: 97/67 105/67 123/69 99/54  Pulse: 82 83 109 106  Temp: 97.6 F (36.4 C)  98.4 F (36.9 C) 98.2 F (36.8 C)  TempSrc: Oral  Oral Oral  Resp: 20  18 18   Height:    6' (1.829 m)  Weight:   306 lb 10.6 oz (139.1 kg)   SpO2:   93% 94%   Weight change: 6 lb 6.3 oz (2.9 kg)  Intake/Output Summary (Last 24 hours) at 02/05/13 1712 Last data filed at 02/05/13 1230  Gross per 24 hour  Intake   1055 ml  Output   6455 ml  Net  -5400 ml   Physical Exam General: lying in bed, tearful HEENT: EOMI Lungs: anterior ausculation only, clear bilaterally  Heart: RRR Abdomen: soft, NT, +bs Extremities: 1+ b/l pitting edema in lower extremities; skin blistering/erosions to medial thighs bilaterally, tender to light palpation but no drainage.  Neurologic: alert & oriented X3, strength grossly intact GU: dry dressing in place, spotted with blood and intact. circumscised penis tender to palpation, left sided patch of ?necrosis. Less odor today  Lab Results: Basic Metabolic Panel:  Recent Labs Lab 02/03/13 0545 02/05/13 0430  NA 134* 135*  K 4.8 3.6*  CL 90* 93*  CO2 25 27  GLUCOSE 114* 117*  BUN 30* 15  CREATININE 5.75* 3.32*  CALCIUM 9.3 8.4  MG  --  1.8  PHOS  --  3.6   Liver Function Tests:  Recent Labs Lab 02/03/13 0545 02/05/13 0430  AST 9  --   ALT <5  --   ALKPHOS 188*  --   BILITOT 1.6*  --   PROT 9.1*  --   ALBUMIN 2.2* 1.8*   CBC:  Recent Labs Lab 02/03/13 0545 02/04/13 0352 02/05/13 0430  WBC 15.9*  14.8* 14.1*  NEUTROABS 12.6*  --  11.1*  HGB 8.3* 7.2* 6.7*  HCT 27.0* 22.4* 21.3*  MCV 90.6 89.6 90.3  PLT 478* 403* 408*   Cardiac Enzymes:  Recent Labs Lab 02/03/13 0545  TROPONINI <0.30   CBG:  Recent Labs Lab 02/04/13 1139 02/04/13 2008 02/05/13 0352 02/05/13 0741 02/05/13 1423 02/05/13 1632  GLUCAP 118* 114* 132* 103* 138* 162*   Coagulation:  Recent Labs Lab 01/30/13 1000 02/03/13 0545 02/05/13 0430  LABPROT 29.8* 23.7* 18.9*  INR 2.96* 2.20* 1.63*   Anemia Panel:  Recent Labs Lab 02/03/13 1525  FERRITIN 275  TIBC 189*  IRON 56   Urine Drug Screen: Drugs of Abuse     Component Value Date/Time   LABOPIA NEG 03/17/2006 1455   COCAINSCRNUR NEG 03/17/2006 1455   LABBENZ NEG 03/17/2006 1455   AMPHETMU NEG 03/17/2006 1455    Micro Results: Recent Results (from the past 240 hour(s))  CULTURE, BLOOD (ROUTINE X 2)     Status: None   Collection Time    02/03/13  5:45 AM      Result Value Range Status   Specimen Description BLOOD ARM LEFT  Final   Special Requests BOTTLES DRAWN AEROBIC AND ANAEROBIC 10CC   Final   Culture  Setup Time     Final   Value: 02/03/2013 13:55     Performed at [facility]   Culture     Final   Value:        BLOOD CULTURE RECEIVED NO GROWTH TO DATE CULTURE WILL BE HELD FOR 5 DAYS BEFORE ISSUING A FINAL NEGATIVE REPORT     Performed at [facility]   Report Status PENDING   Incomplete  CULTURE, BLOOD (ROUTINE X 2)     Status: None   Collection Time    02/03/13  5:52 AM      Result Value Range Status   Specimen Description BLOOD HAND LEFT   Final   Special Requests BOTTLES DRAWN AEROBIC ONLY 8CC   Final   Culture  Setup Time     Final   Value: 02/03/2013 13:54     Performed at [facility]   Culture     Final   Value:        BLOOD CULTURE RECEIVED NO GROWTH TO DATE CULTURE WILL BE HELD FOR 5 DAYS BEFORE ISSUING A FINAL NEGATIVE REPORT     Performed at [facility]   Report Status PENDING    Incomplete  MRSA PCR SCREENING     Status: Abnormal   Collection Time    02/03/13 12:07 PM      Result Value Range Status   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 It is not     intended to diagnose MRSA     infection nor to guide or     monitor treatment for     MRSA infections.     RESULT CALLED TO, READ BACK BY AND VERIFIED WITH:     A. BRAKE RN 14:30 02/03/13 ([email or web address])  WOUND CULTURE     Status: None   Collection Time    02/04/13 10:38 AM      Result Value Range Status   Specimen Description WOUND PENIS   Final   Special Requests NONE   Final   Gram Stain     Final   Value: FEW WBC PRESENT,BOTH PMN AND MONONUCLEAR     RARE SQUAMOUS EPITHELIAL CELLS PRESENT     MODERATE GRAM NEGATIVE RODS     MODERATE GRAM POSITIVE COCCI IN PAIRS     Performed at [facility]   Culture     Final   Value: Culture reincubated for better growth     Performed at [facility]   Report Status PENDING   Incomplete   Medications: I have reviewed the patient's current medications. Scheduled Meds: . amiodarone  200 mg Oral Daily  . amoxicillin-clavulanate  1 tablet Oral TID  . ceFAZolin  3 g Intravenous Once  . Chlorhexidine Gluconate Cloth  6 each Topical Q0600  . cinacalcet  30 mg Oral Q breakfast  . colchicine  0.6 mg Oral Q3 days  . darbepoetin (ARANESP) injection - DIALYSIS  200 mcg Intravenous Q Thu-HD  . diclofenac sodium  2 g Topical QID  . Febuxostat  1 tablet Oral Daily  .  ferric gluconate (FERRLECIT/NULECIT) IV  125 mg Intravenous Q T,Th,Sa-HD  . insulin aspart  0-9 Units Subcutaneous Q4H  . insulin glargine  45 Units Subcutaneous QHS  . LORazepam  0.5 mg Oral Daily  . multivitamin  1 tablet Oral QHS  . mupirocin ointment  1 application Nasal BID  . pantoprazole  40 mg Oral QAC  breakfast  . sevelamer carbonate  1,600 mg Oral TID WC  . traMADol  100 mg Oral BID  . Warfarin - Pharmacist Dosing Inpatient   Does not apply q1800   Continuous Infusions:   PRN Meds:.acetaminophen, fluticasone, loratadine Assessment/Plan:  Necrosis of distal penile shaft skin and foreskin--s/p circumcision and debridement of skin 02/04/13 by urology. Sepsis as a result with hypotension (more than usual) and leukocytosis. Tmax 100.46F this admission, afebrile yesterday and today. On Augmentin but will transition to broader antibiotics given severity of infection. 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  -Augmentin x3 days, will d/c and start IV vancomycin and Zosyn per pharmacy and monitor for clinical improvement and narrow therapy - wound culture re-incubated for better growth, moderate gram neg rods and gram positive cocci in pairs -GC/Chlamydia, UA, UCx, BCx NGTD -appreciate urology following, may need to return to OR if further infection, monitoring closely  Skin erosion and ulcerations: seen by wound care who recommended vascular evaluation.  Vascular evaluated patient yesterday (Dr. Myra Gianotti) who signed off as ulcers unlikely arterial in origin.  Likely secondary to volume overload and chronic lower extremity edema per vascular.    -per vascular recommendation: leg elevation and compression, likely will benefit from [facility] boot as outpatient and changed three times a week, but wound care recommends discharge follow up at wound center and continuation of non-adherent dressings at this time.  -continue IV abx  ESRD onHD (T/Thu/S): HD done again today per schedule.  -HD today, remains chronically hypotensive but potentially worse with sepsis. Trying for aggressive volume removal but will be limited by BP -last PTH 397 08/2012, will stop doxercalciferol and start sensipar  A Fib: HR stable. He is on amiodarone 200 mg daily at home and Coumadin.  INR 1.63 today after holding coumadin  yesterday. -coumadin per pharmacy -continue amiodarone  DM-II: A1c 4.9 on 12/13/12, which may not be accurate due to dialysis. Patient is taking Lantus 45 unit at bedtime daily.  -continue Lantus 45 U qHS -SSI   CHF with EF of 25%: BW was 318 on 09/25/12-->299 on admission but 206lb's today.  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    The patient does have a current PCP Evelena Peat, DO) and does need an [facility] hospital follow-up appointment after discharge.  The patient does not have transportation limitations that hinder transportation to clinic appointments.  Services Needed at time of discharge: Y = Yes, Blank = No PT:   OT:   RN:   Equipment:   Other:     LOS: 2 days   Darden Palmer, MD 02/05/2013, 5:12 PM

## 2013-02-05 NOTE — Progress Notes (Signed)
Pt a/o, no c/o pain, pt had HD today tolerated well, pt ate well for lunch, dressing clean/dry/intact, pt stable

## 2013-02-05 NOTE — Procedures (Signed)
I have personally attended this patient's dialysis session.   Using albumin and midodrine for BP support. BP 70's Transfuse 2 U PRBC's for Hb of 6.7 - may help with BP K 3.6-->4K bath Discontinue hectorol (see prog note)  Emylee Decelle B

## 2013-02-06 DIAGNOSIS — N481 Balanitis: Secondary | ICD-10-CM | POA: Diagnosis present

## 2013-02-06 DIAGNOSIS — I4891 Unspecified atrial fibrillation: Secondary | ICD-10-CM

## 2013-02-06 DIAGNOSIS — I96 Gangrene, not elsewhere classified: Secondary | ICD-10-CM | POA: Diagnosis present

## 2013-02-06 LAB — PROTIME-INR
INR: 1.42 (ref 0.00–1.49)
PROTHROMBIN TIME: 17 s — AB (ref 11.6–15.2)

## 2013-02-06 LAB — GLUCOSE, CAPILLARY
GLUCOSE-CAPILLARY: 189 mg/dL — AB (ref 70–99)
Glucose-Capillary: 205 mg/dL — ABNORMAL HIGH (ref 70–99)
Glucose-Capillary: 186 mg/dL — ABNORMAL HIGH (ref 70–99)
Glucose-Capillary: 144 mg/dL — ABNORMAL HIGH (ref 70–99)

## 2013-02-06 LAB — RENAL FUNCTION PANEL
Albumin: 1.9 g/dL — ABNORMAL LOW (ref 3.5–5.2)
BUN: 16 mg/dL (ref 6–23)
CHLORIDE: 94 meq/L — AB (ref 96–112)
CO2: 26 mEq/L (ref 19–32)
Calcium: 8.3 mg/dL — ABNORMAL LOW (ref 8.4–10.5)
Creatinine, Ser: 3.26 mg/dL — ABNORMAL HIGH (ref 0.50–1.35)
GFR calc Af Amer: 24 mL/min — ABNORMAL LOW (ref >90)
GFR calc non Af Amer: 21 mL/min — ABNORMAL LOW (ref >90)
GLUCOSE: 174 mg/dL — AB (ref 70–99)
Phosphorus: 2.6 mg/dL (ref 2.3–4.6)
Potassium: 4 mEq/L (ref 3.7–5.3)
Sodium: 135 mEq/L — ABNORMAL LOW (ref 137–147)

## 2013-02-06 LAB — URINE MICROSCOPIC-ADD ON

## 2013-02-06 LAB — URINALYSIS, ROUTINE W REFLEX MICROSCOPIC
Glucose, UA: NEGATIVE mg/dL
Ketones, ur: 15 mg/dL — AB
Nitrite: POSITIVE — AB
Specific Gravity, Urine: 1.025 (ref 1.005–1.030)
UROBILINOGEN UA: 0.2 mg/dL (ref 0.0–1.0)
pH: 6 (ref 5.0–8.0)

## 2013-02-06 LAB — CBC
HEMATOCRIT: 24.5 % — AB (ref 39.0–52.0)
HEMOGLOBIN: 7.8 g/dL — AB (ref 13.0–17.0)
MCH: 28.4 pg (ref 26.0–34.0)
MCHC: 31.8 g/dL (ref 30.0–36.0)
MCV: 89.1 fL (ref 78.0–100.0)
Platelets: 415 10*3/uL — ABNORMAL HIGH (ref 150–400)
RBC: 2.75 MIL/uL — AB (ref 4.22–5.81)
RDW: 17.1 % — ABNORMAL HIGH (ref 11.5–15.5)
WBC: 16.1 10*3/uL — AB (ref 4.0–10.5)

## 2013-02-06 LAB — WOUND CULTURE

## 2013-02-06 MED ORDER — WARFARIN SODIUM 7.5 MG PO TABS
7.5000 mg | ORAL_TABLET | Freq: Once | ORAL | Status: AC
  Administered 2013-02-06: 19:00:00 7.5 mg via ORAL
  Filled 2013-02-06 (×2): qty 1

## 2013-02-06 MED ORDER — HYDROCODONE-ACETAMINOPHEN 7.5-325 MG PO TABS
1.0000 | ORAL_TABLET | Freq: Four times a day (QID) | ORAL | Status: DC | PRN
  Administered 2013-02-06 – 2013-02-08 (×4): 1 via ORAL
  Filled 2013-02-06 (×3): qty 1

## 2013-02-06 MED ORDER — PIPERACILLIN-TAZOBACTAM IN DEX 2-0.25 GM/50ML IV SOLN
2.2500 g | Freq: Three times a day (TID) | INTRAVENOUS | Status: DC
  Administered 2013-02-06 – 2013-02-07 (×3): 2.25 g via INTRAVENOUS
  Filled 2013-02-06 (×5): qty 50

## 2013-02-06 MED ORDER — INSULIN ASPART 100 UNIT/ML ~~LOC~~ SOLN
0.0000 [IU] | Freq: Three times a day (TID) | SUBCUTANEOUS | Status: DC
  Administered 2013-02-06: 18:00:00 2 [IU] via SUBCUTANEOUS
  Administered 2013-02-07: 06:00:00 via SUBCUTANEOUS
  Administered 2013-02-07: 12:00:00 2 [IU] via SUBCUTANEOUS
  Administered 2013-02-08 – 2013-02-10 (×3): 1 [IU] via SUBCUTANEOUS
  Administered 2013-02-11 – 2013-02-12 (×2): 2 [IU] via SUBCUTANEOUS
  Administered 2013-02-12 (×2): 1 [IU] via SUBCUTANEOUS
  Administered 2013-02-13 (×2): 2 [IU] via SUBCUTANEOUS
  Administered 2013-02-14: 3 [IU] via SUBCUTANEOUS
  Administered 2013-02-14 – 2013-02-15 (×2): 1 [IU] via SUBCUTANEOUS
  Administered 2013-02-16 (×2): 2 [IU] via SUBCUTANEOUS
  Administered 2013-02-16: 1 [IU] via SUBCUTANEOUS

## 2013-02-06 MED ORDER — VANCOMYCIN HCL IN DEXTROSE 1-5 GM/200ML-% IV SOLN: 1000.0000 mg | INTRAVENOUS | Status: DC

## 2013-02-06 NOTE — Progress Notes (Signed)
ANTIBIOTIC CONSULT NOTE - FOLLOW UP   Pharmacy Consult for Vancomycin, Zosyn, Coumadin Indication: Penile cellulitis/necrosis, S/p I&D, sepsis + atrial fibrillation  Allergies  Allergen Reactions  . Argatroban Other (See Comments)    Ventricular tachycardia  . Ace Inhibitors     Acute renal failure with multiple trials  . Heparin     Thrombcytopenia. SRA positive. See miscellaneous test (SRA results)    Patient Measurements: Height: 6' (182.9 cm) Weight: 313 lb 7.9 oz (142.2 kg) IBW/kg (Calculated) : 77.6 Adjusted Body Weight: n/a  Vital Signs: Temp: 98 F (36.7 C) (01/11 0502) Temp src: Oral (01/11 0502) BP: 84/49 mmHg (01/11 0502) Pulse Rate: 85 (01/11 0502) Intake/Output from previous day: 01/10 0701 - 01/11 0700 In: 1525 [P.O.:1200; Blood:325] Out: 3875  Intake/Output from this shift: Total I/O In: 240 [P.O.:240] Out: -   Labs:  Recent Labs  02/05/13 0430 02/05/13 2023 02/06/13 0513  WBC 14.1* 18.2* 16.1*  HGB 6.7* 7.7* 7.8*  PLT 408* 378 415*  CREATININE 3.32*  --  3.26*   Estimated Creatinine Clearance: 40.5 ml/min (by C-G formula based on Cr of 3.26). No results found for this basename: VANCOTROUGH, Leodis BinetVANCOPEAK, VANCORANDOM, GENTTROUGH, GENTPEAK, GENTRANDOM, TOBRATROUGH, TOBRAPEAK, TOBRARND, AMIKACINPEAK, AMIKACINTROU, AMIKACIN,  in the last 72 hours   Microbiology: Recent Results (from the past 720 hour(s))  CULTURE, BLOOD (ROUTINE X 2)     Status: None   Collection Time    02/03/13  5:45 AM      Result Value Range Status   Specimen Description BLOOD ARM LEFT   Final   Special Requests BOTTLES DRAWN AEROBIC AND ANAEROBIC 10CC   Final   Culture  Setup Time     Final   Value: 02/03/2013 13:55     Performed at [facility]   Culture     Final   Value:        BLOOD CULTURE RECEIVED NO GROWTH TO DATE CULTURE WILL BE HELD FOR 5 DAYS BEFORE ISSUING A FINAL NEGATIVE REPORT     Performed at [facility]   Report Status PENDING   Incomplete   CULTURE, BLOOD (ROUTINE X 2)     Status: None   Collection Time    02/03/13  5:52 AM      Result Value Range Status   Specimen Description BLOOD HAND LEFT   Final   Special Requests BOTTLES DRAWN AEROBIC ONLY 8CC   Final   Culture  Setup Time     Final   Value: 02/03/2013 13:54     Performed at [facility]   Culture     Final   Value:        BLOOD CULTURE RECEIVED NO GROWTH TO DATE CULTURE WILL BE HELD FOR 5 DAYS BEFORE ISSUING A FINAL NEGATIVE REPORT     Performed at [facility]   Report Status PENDING   Incomplete  MRSA PCR SCREENING     Status: Abnormal   Collection Time    02/03/13 12:07 PM      Result Value Range Status   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 It is not     intended to diagnose MRSA  infection nor to guide or     monitor treatment for     MRSA infections.     RESULT CALLED TO, READ BACK BY AND VERIFIED WITH:     A. BRAKE RN 14:30 02/03/13 ([email or web address])  WOUND CULTURE     Status: None   Collection Time    02/04/13 10:38 AM      Result Value Range Status   Specimen Description WOUND PENIS   Final   Special Requests NONE   Final   Gram Stain     Final   Value: FEW WBC PRESENT,BOTH PMN AND MONONUCLEAR     RARE SQUAMOUS EPITHELIAL CELLS PRESENT     MODERATE GRAM NEGATIVE RODS     MODERATE GRAM POSITIVE COCCI IN PAIRS     Performed at [facility]   Culture     Final   Value: MULTIPLE ORGANISMS PRESENT, NONE PREDOMINANT     Note: NO STAPHYLOCOCCUS AUREUS ISOLATED NO GROUP A STREP (S.PYOGENES) ISOLATED     Performed at [facility]   Report Status 02/06/2013 FINAL   Final    Medical History: Past Medical History  Diagnosis Date  . Other and unspecified hyperlipidemia   . Insomnia, unspecified   . Obstructive sleep apnea (adult)  (pediatric)   . Allergic rhinitis, cause unspecified   . Nonischemic cardiomyopathy     s/p [employer] Jude ICD  . Unspecified essential hypertension   . Type II or unspecified type diabetes mellitus without mention of complication, not stated as uncontrolled   . CKD (chronic kidney disease) stage 3, GFR 30-59 ml/min   . Esophageal reflux   . Morbid obesity   . Dysuria   . Tobacco use disorder   . Dysrhythmia   . Pacemaker   . Antral ulcer   . Renal azotemia   . Arthritis     Gout w/hyperuricemia  . Morbid obesity   . Shortness of breath   . Automatic implantable cardioverter-defibrillator in situ     Medications:  Prescriptions prior to admission  Medication Sig Dispense Refill  . acetaminophen (TYLENOL) 500 MG tablet Take 500 mg by mouth every 6 (six) hours as needed.      Marland Kitchen amiodarone (PACERONE) 200 MG tablet Take 200 mg by mouth daily.      . colchicine 0.6 MG tablet Take 1 tablet (0.6 mg total) by mouth See admin instructions. Takes every three days.  15 tablet  2  . diclofenac sodium (VOLTAREN) 1 % GEL Apply 2 g topically 4 (four) times daily.  1 Tube  2  . esomeprazole (NEXIUM) 20 MG capsule Take 20 mg by mouth daily at 12 noon. TAKE ONE CAPSULE EVERY DAY BEFORE BREAKFAST      . Febuxostat (ULORIC) 80 MG TABS Take 1 tablet (80 mg total) by mouth daily.  30 tablet  5  . fluticasone (FLONASE) 50 MCG/ACT nasal spray Place 1 spray into both nostrils daily as needed for allergies. For allergies  16 g  2  . Insulin Glargine (LANTUS SOLOSTAR) 100 UNIT/ML Solostar Pen Inject 45 Units into the skin daily at 10 pm.       . loratadine (CLARITIN) 10 MG tablet Take 10 mg by mouth daily as needed for allergies.      Marland Kitchen LORazepam (ATIVAN) 0.5 MG tablet Take 0.5 mg by mouth daily. TAKE 1 TABLET TWICE A DAY AS NEEDED      . traMADol (ULTRAM) 50 MG tablet Take 100 mg by mouth 2 (  two) times daily.      Marland Kitchen warfarin (COUMADIN) 2.5 MG tablet Take 2.5 mg by mouth daily. Takes 1 and half tab every day  except on Monday. Monday takes just 2.5 mg.       Assessment: 47 YOM with necrosis of distal penile shaft skin and foreskin -- s/p circumcision and debridement of skin on 1/9 by urology. He became septic with hypotension and leukocytosis. He was on Augmentin but broadening coverage given severity of infection. WBC 14.1, CrCl~ 39 mL/min (improved from 1/8). MRSA PCR positive  Pt on coumadin PTA for afib. Baseline INR therapeutic 2.20. Hgb 7.8 No bleeding noted. 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 Received 5mg  x 1 last night INR this AM 01/10 1.63>1.42  Augmentin 1/8>>1/10 Vanc 1/10>> Zosyn 1/10>>  1/9: Wound Cx>> 1/8: Blood Cx x2>>  Goal of Therapy:  Target pre-HD level 15-25 mcg/ml  Plan:  Will give Coumadin 7.5 mg x 1 tonight Vancomycin 1 gram q-TTS with HD Change Zosyn to 2.25g IV Q8h (HD pt) F/u pre-HD level (schedule T/T/S) F/u CBC, renal fx, and patient clinical status Daily PT/INR    Britt Bottom B. Artelia Laroche, PharmD Clinical Pharmacist - Resident Pager: [phone] 02/06/2013 10:41 AM

## 2013-02-06 NOTE — Progress Notes (Signed)
Patient refused CPAP at this time. Patient states he does not wear CPAP at home. He wears oxygen at bedtime. Patient encouraged to call for Respiratory if he would like to use CPAP during hospital stay.

## 2013-02-06 NOTE — Progress Notes (Signed)
Patient voiced that feels depressed about the potential for future surgeries on his genitals.  States that he thinks his girlfriend will "leave me for somebody else".  Offered spiritual consult, but patient refused.  Patient denies any suicidal ideation at this time.  MD notified about patient's depressed state.  Patient rating pain in groin 7/10; PRN Tylenol administered.  MD notified for additional PRN.    Patient's B/P and urinary output remain low; MD aware and monitoring.  Will continue to monitor patient's status.

## 2013-02-06 NOTE — Progress Notes (Signed)
Urology Progress Note  2 Days Post-Op circumcision and penile glans debridement. Glans dusky but dry today.  Area on glans on L glans larger today, now 2 cm star-shaped. Grey-black center.   Subjective:     No acute urologic events overnight. Ambulation:   negative Flatus:    positive Bowel movement  positive  Pain: some relief  Objective:  Blood pressure 84/49, pulse 85, temperature 98 F (36.7 C), temperature source Oral, resp. rate 18, height 6' (1.829 m), weight 142.2 kg (313 lb 7.9 oz), SpO2 96.00%.  Physical Exam:  General:  No acute distress, awake  Genitourinary:    See HPI Foley:  yes    I/O last 3 completed shifts: In: 1775 [P.O.:1200; I.V.:250; Blood:325] Out: 4005 [Urine:125; DCVUD:3143; Blood:5]  Recent Labs     02/05/13  2023  02/06/13  0513  HGB  7.7*  7.8*  WBC  18.2*  16.1*  PLT  378  415*    Recent Labs     02/05/13  0430  02/06/13  0513  NA  135*  135*  K  3.6*  4.0  CL  93*  94*  CO2  27  26  BUN  15  16  CREATININE  3.32*  3.26*  CALCIUM  8.4  8.3*  GFRNONAA  20*  21*  GFRAA  24*  24*     Recent Labs     02/05/13  0430  02/06/13  0513  INR  1.63*  1.42     No components found with this basename: ABG,   Assessment/Plan: Anticipate pt will need more surgery to debride more glans. hw will begin wound care consult.  Catheter not removed. Continue any current medications. Wound care discussed.

## 2013-02-06 NOTE — Progress Notes (Signed)
Pt. Refused cpap. 

## 2013-02-06 NOTE — Progress Notes (Signed)
  Date: 02/06/2013  Patient name: Adam [name]  Medical record number: [id number]  Date of birth: 18-May-1965   This patient has been seen and the plan of care was discussed with the house staff. Please see their note for complete details. I concur with their findings.  Inez Catalina, MD 02/06/2013, 8:48 PM

## 2013-02-06 NOTE — Progress Notes (Signed)
Utilization review completed.  

## 2013-02-06 NOTE — Progress Notes (Addendum)
Subjective:  POD #2 s/p circumcision, debridement of penile shaft skin due to gangrenous changes Had dialysis yesterday 142.2-->139.1 but floor weight today back up to 142.2 Transfused in HD for Hb of 6.7 Urol raised question of calciphylaxis of the penis (noted calcification of penile vessels on pelvic CT) and we stopped hectorol, started sensipar Primary service has excalated antibiotic coverage  Objective:   Vital signs in last 24 hours: Filed Vitals:   02/05/13 2159 02/05/13 2234 02/06/13 0130 02/06/13 0502  BP:  80/60 82/53 84/49   Pulse: 120  126 85  Temp: 100.3 F (37.9 C)   98 F (36.7 C)  TempSrc: Oral   Oral  Resp: 18  18 18   Height:      Weight:    142.2 kg (313 lb 7.9 oz)  SpO2: 96%   96%   Weight change: -5 kg (-11 lb 0.4 oz)  Intake/Output Summary (Last 24 hours) at 02/06/13 1029 Last data filed at 02/06/13 [id number]  Gross per 24 hour  Intake   1645 ml  Output   3875 ml  Net  -2230 ml    Physical Exam:  Blood pressure 84/49, pulse 85, temperature 98 F (36.7 C), temperature source Oral, resp. rate 18, height 6' (1.829 m), weight 142.2 kg (313 lb 7.9 oz), SpO2 96.00%. General: Awake and appropriate but falls asleep during examination Heart: Tachy reg , no rub S1S2 no S3 Lungs: Anteriorly clear Abdomen: Obese, non-tender; large pannus Extremities: Dialysis Access: pos. Bruit R FA AVF Lower extremities= 3+ edema left thigh wrapped; with blisters on left LE and blisters on foot; skin on LE very dark; proximal thigh indurated to mid thigh level Has a foley in with non-occl dressings on penis; visualized portion of penis looks very pale  Labs:  Recent Labs Lab 02/03/13 0545 02/05/13 0430 02/06/13 0513  NA 134* 135* 135*  K 4.8 3.6* 4.0  CL 90* 93* 94*  CO2 25 27 26   GLUCOSE 114* 117* 174*  BUN 30* 15 16  CREATININE 5.75* 3.32* 3.26*  CALCIUM 9.3 8.4 8.3*  PHOS  --  3.6 2.6     Recent Labs Lab 02/03/13 0545 02/05/13 0430 02/06/13 0513  AST 9  --    --   ALT <5  --   --   ALKPHOS 188*  --   --   BILITOT 1.6*  --   --   PROT 9.1*  --   --   ALBUMIN 2.2* 1.8* 1.9*    Recent Labs Lab 02/03/13 0545 02/04/13 0352 02/05/13 0430 02/05/13 2023 02/06/13 0513  WBC 15.9* 14.8* 14.1* 18.2* 16.1*  NEUTROABS 12.6*  --  11.1*  --   --   HGB 8.3* 7.2* 6.7* 7.7* 7.8*  HCT 27.0* 22.4* 21.3* 24.4* 24.5*  MCV 90.6 89.6 90.3 88.1 89.1  PLT 478* 403* 408* 378 415*    Recent Labs Lab 02/03/13 0545  TROPONINI <0.30     Recent Labs Lab 02/05/13 0741 02/05/13 1423 02/05/13 1632 02/05/13 2028 02/06/13 0601  GLUCAP 103* 138* 162* 165* 205*       Recent Labs Lab 02/03/13 1525  IRON 56  TIBC 189*  FERRITIN 275   Scheduled Medications  . amiodarone  200 mg Oral Daily  . Chlorhexidine Gluconate Cloth  6 each Topical Q0600  . cinacalcet  30 mg Oral Q breakfast  . colchicine  0.6 mg Oral Q3 days  . darbepoetin (ARANESP) injection - DIALYSIS  200 mcg Intravenous Q Thu-HD  .  diclofenac sodium  2 g Topical QID  . Febuxostat  1 tablet Oral Daily  . ferric gluconate (FERRLECIT/NULECIT) IV  125 mg Intravenous Q T,Th,Sa-HD  . insulin aspart  0-9 Units Subcutaneous Q4H  . insulin glargine  45 Units Subcutaneous QHS  . LORazepam  0.5 mg Oral Daily  . multivitamin  1 tablet Oral QHS  . mupirocin ointment  1 application Nasal BID  . pantoprazole  40 mg Oral QAC breakfast  . piperacillin-tazobactam (ZOSYN)  IV  3.375 g Intravenous Q8H  . sevelamer carbonate  1,600 mg Oral TID WC  . traMADol  100 mg Oral BID  . vancomycin  1,750 mg Intravenous Q24H  . Warfarin - Pharmacist Dosing Inpatient   Does not apply q1800   Weight trending (EDW 143.5 and needs lower)  02/06/13 0502 ! 142.2 kg (313 lb 7.9 oz)      Floor weight 02/05/13 1230 ! 139.1 kg (306 lb 10.6 oz)    Post HD 02/05/13 0810 ! 142.2 kg (313 lb 7.9 oz)      Pre HD 02/05/13 0510 ! 140.7 kg (310 lb 3 oz)          02/04/13 1830 ! 145.9 kg (321 lb 10.4 oz)    Post HD 02/04/13  1415 ! 147.2 kg (324 lb 8.3 oz)      Pre HD  Dialysis Orders: [facility] 4[facility]75 hours Optiflux 180 550/A 1.5 -4K 2.25 Ca no var NA or profile EDW 143.5 Access -right lower AVF; NO HEPARIN Epo 22,000 no Fe Hectorol 6  Recent labs: Hgb 8.9 12/29, 8.6 11/25 - on 22,000 Epo over a month; tsat 13% 12/18 got venofer 12/29 - ferritin 62 in October; iPTH 157 12/18 improved with normal Ca and P   ASSESSMENT/RECOMMENDATIONS  1. Left thigh and foot wounds with cellulitis - seen by Wound Care RN; necrotic and full thickness but not overtly infected;  Not felt vascular by [facility] ([facility]); attempting to reduce pts volume (and thus his LE edema)  On ATB's - re-escalated back to vanco and zosyn with temp last PM to 100.3 with tachycardia 2. Penile wound with gangrenous change - s/p circumcision, debridement of penile shaft skin due to gangrenous changes   Antibiotics now vanco/zosyn again  Urology raised question of possible calciphylaxis of the penis.  Certainly cannot exclude.  Last PTH was 157 but IS on Vit D.  Cannot exclude this as etiology - have discontinued Hectorol and started sensipar 30/day;  already on non-calcium based phosphate binder.  3. ESRD - TTS - s/p daily HD X 3 - did not make much of a dent in weight due to low BP. 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 Low BP is impeding volume removal 4. BP/volume - usual gains 3 - 5 kg; BP84/67 and as op generally 90s - 100s - much lower during this hosp;  Trying for more for aggressive volume removal while here with serial HD but as above BP precludes 5. Anemia - chronically low Hgb in the 8s for several months  - on Aranesp 200 here; Fe low - 13% sat - last venofer given 12/29 -  EPO resistance due to inflammation; transfused 2 units on HD 1/10 for Hb of 6.7 6. Metabolic bone disease - Controlled with hectorol and on renvela - but with question of calciphylaxis changed from hectorol to sensipar 7. 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 Atrial fibrillation - on amio and coumadin  10. 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 Hx gout - on colchicine; also on Febuoxostat - has not been studied in pts with ESRD 12. DM - Lantus + SSI per primary 13. NICM - last Echo was 12/2011 with EF 25%; massive CM on CXR - may be worse and contributory to low BP and surely edema 14. OSA - uses nocturnal O2 but not CPAP   Camille Bal, MD [facility] [phone] Pager 02/06/2013, 10:29 AM

## 2013-02-06 NOTE — Progress Notes (Signed)
Patient's BP remains soft.  Upon reviewing patient's chart, it has been soft intermittently throughout stay.  Patient is alert, oriented and asymptomatic.  Patient's heart rate increases when patient is moving, but returns to his sinus rhythm baseline once he settles back down.  Patient also remains asymptomatic during this.  Will continue to monitor.  Blood pressure 82/53, pulse 126, temperature 100.3 F (37.9 C), temperature source Oral, resp. rate 18, height 6' (1.829 m), weight 139.1 kg (306 lb 10.6 oz), SpO2 96.00%. Adam [name]

## 2013-02-06 NOTE — Progress Notes (Signed)
Subjective: [name] was seen and examined at bedside this morning.  He reports feeling better since admission and is hopeful in regards to functionality of his penis.  He is asking when when he was foley can be removed which we will need to talk to urology.    Objective: Vital signs in last 24 hours: Filed Vitals:   02/05/13 2159 02/05/13 2234 02/06/13 0130 02/06/13 0502  BP:  80/60 82/53 84/49   Pulse: 120  126 85  Temp: 100.3 F (37.9 C)   98 F (36.7 C)  TempSrc: Oral   Oral  Resp: 18  18 18   Height:      Weight:    313 lb 7.9 oz (142.2 kg)  SpO2: 96%   96%   Weight change: -11 lb 0.4 oz (-5 kg)  Intake/Output Summary (Last 24 hours) at 02/06/13 1112 Last data filed at 02/06/13 4270  Gross per 24 hour  Intake   1320 ml  Output   3875 ml  Net  -2555 ml   Physical Exam General: lying in bed HEENT: EOMI Lungs: CTA b/l Heart: RRR Abdomen: soft, obese, NT, +bs Extremities: 1+ b/l pitting edema in lower extremities; skin blistering/erosions to medial thighs bilaterally with dry dressing in place, tender to light palpation but no drainage.  Neurologic: alert & oriented X3, strength grossly intact GU: dry dressing in place, dried up blood on gauze, no visible pus or drainage, no odor, circumscised penis mild tender to palpation, left sided patch of necrosis on glans brown color.   Lab Results: Basic Metabolic Panel:  Recent Labs Lab 02/05/13 0430 02/06/13 0513  NA 135* 135*  K 3.6* 4.0  CL 93* 94*  CO2 27 26  GLUCOSE 117* 174*  BUN 15 16  CREATININE 3.32* 3.26*  CALCIUM 8.4 8.3*  MG 1.8  --   PHOS 3.6 2.6   Liver Function Tests:  Recent Labs Lab 02/03/13 0545 02/05/13 0430 02/06/13 0513  AST 9  --   --   ALT <5  --   --   ALKPHOS 188*  --   --   BILITOT 1.6*  --   --   PROT 9.1*  --   --   ALBUMIN 2.2* 1.8* 1.9*   CBC:  Recent Labs Lab 02/03/13 0545  02/05/13 0430 02/05/13 2023 02/06/13 0513  WBC 15.9*  < > 14.1* 18.2* 16.1*  NEUTROABS  12.6*  --  11.1*  --   --   HGB 8.3*  < > 6.7* 7.7* 7.8*  HCT 27.0*  < > 21.3* 24.4* 24.5*  MCV 90.6  < > 90.3 88.1 89.1  PLT 478*  < > 408* 378 415*  < > = values in this interval not displayed. Cardiac Enzymes:  Recent Labs Lab 02/03/13 0545  TROPONINI <0.30   CBG:  Recent Labs Lab 02/05/13 0352 02/05/13 0741 02/05/13 1423 02/05/13 1632 02/05/13 2028 02/06/13 0601  GLUCAP 132* 103* 138* 162* 165* 205*   Coagulation:  Recent Labs Lab 02/03/13 0545 02/05/13 0430 02/06/13 0513  LABPROT 23.7* 18.9* 17.0*  INR 2.20* 1.63* 1.42   Anemia Panel:  Recent Labs Lab 02/03/13 1525  FERRITIN 275  TIBC 189*  IRON 56   Urine Drug Screen: Drugs of Abuse     Component Value Date/Time   LABOPIA NEG 03/17/2006 1455   COCAINSCRNUR NEG 03/17/2006 1455   LABBENZ NEG 03/17/2006 1455   AMPHETMU NEG 03/17/2006 1455    Micro Results: Recent Results (from the past  240 hour(s))  CULTURE, BLOOD (ROUTINE X 2)     Status: None   Collection Time    02/03/13  5:45 AM      Result Value Range Status   Specimen Description BLOOD ARM LEFT   Final   Special Requests BOTTLES DRAWN AEROBIC AND ANAEROBIC 10CC   Final   Culture  Setup Time     Final   Value: 02/03/2013 13:55     Performed at [facility]   Culture     Final   Value:        BLOOD CULTURE RECEIVED NO GROWTH TO DATE CULTURE WILL BE HELD FOR 5 DAYS BEFORE ISSUING A FINAL NEGATIVE REPORT     Performed at [facility]   Report Status PENDING   Incomplete  CULTURE, BLOOD (ROUTINE X 2)     Status: None   Collection Time    02/03/13  5:52 AM      Result Value Range Status   Specimen Description BLOOD HAND LEFT   Final   Special Requests BOTTLES DRAWN AEROBIC ONLY 8CC   Final   Culture  Setup Time     Final   Value: 02/03/2013 13:54     Performed at [facility]   Culture     Final   Value:        BLOOD CULTURE RECEIVED NO GROWTH TO DATE CULTURE WILL BE HELD FOR 5 DAYS BEFORE ISSUING A FINAL NEGATIVE  REPORT     Performed at [facility]   Report Status PENDING   Incomplete  MRSA PCR SCREENING     Status: Abnormal   Collection Time    02/03/13 12:07 PM      Result Value Range Status   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 It is not     intended to diagnose MRSA     infection nor to guide or     monitor treatment for     MRSA infections.     RESULT CALLED TO, READ BACK BY AND VERIFIED WITH:     A. BRAKE RN 14:30 02/03/13 ([email or web address])  WOUND CULTURE     Status: None   Collection Time    02/04/13 10:38 AM      Result Value Range Status   Specimen Description WOUND PENIS   Final   Special Requests NONE   Final   Gram Stain     Final   Value: FEW WBC PRESENT,BOTH PMN AND MONONUCLEAR     RARE SQUAMOUS EPITHELIAL CELLS PRESENT     MODERATE GRAM NEGATIVE RODS     MODERATE GRAM POSITIVE COCCI IN PAIRS     Performed at [facility]   Culture     Final   Value: MULTIPLE ORGANISMS PRESENT, NONE PREDOMINANT     Note: NO STAPHYLOCOCCUS AUREUS ISOLATED NO GROUP A STREP (S.PYOGENES) ISOLATED     Performed at [facility]   Report Status 02/06/2013 FINAL   Final   Medications: I have reviewed the patient's current medications. Scheduled Meds: . amiodarone  200 mg Oral Daily  . Chlorhexidine Gluconate Cloth  6 each Topical Q0600  . cinacalcet  30 mg Oral  Q breakfast  . colchicine  0.6 mg Oral Q3 days  . darbepoetin (ARANESP) injection - DIALYSIS  200 mcg Intravenous Q Thu-HD  . diclofenac sodium  2 g Topical QID  . Febuxostat  1 tablet Oral Daily  . ferric gluconate (FERRLECIT/NULECIT) IV  125 mg Intravenous Q T,Th,Sa-HD  . insulin aspart  0-9 Units Subcutaneous Q4H  . insulin glargine  45 Units Subcutaneous QHS  . LORazepam  0.5 mg Oral Daily  . multivitamin  1 tablet Oral QHS  .  mupirocin ointment  1 application Nasal BID  . pantoprazole  40 mg Oral QAC breakfast  . piperacillin-tazobactam (ZOSYN)  IV  2.25 g Intravenous Q8H  . sevelamer carbonate  1,600 mg Oral TID WC  . traMADol  100 mg Oral BID  . [START ON 02/08/2013] vancomycin  1,000 mg Intravenous Q T,Th,Sa-HD  . warfarin  7.5 mg Oral ONCE-1800  . Warfarin - Pharmacist Dosing Inpatient   Does not apply q1800   Continuous Infusions:   PRN Meds:.acetaminophen, fluticasone, loratadine Assessment/Plan:  Necrosis of distal penile shaft skin and foreskin--s/p circumcision and debridement of skin 02/04/13 by urology. Sepsis as a result with hypotension (more than usual) and leukocytosis (trending down). Tmax 100.25F yesterday, afebrile today.  Initially on Augmentin (3 days) and transitioned to IV Vancomycin and Zosyn yesterday.  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  -continue IV vancomycin and Zosyn (day 2) and monitor for clinical improvement and narrow therapy - wound culture: multiple organisms present, no staph aureus isolated, no group a strep isolated.  -GC/Chlamydia pending, UA >300 protein, positive nitrite, large leukocytes and Hgb, UCx pending, BCx NGTD, HIV non-reactive -appreciate urology following, may need to return to OR if further infection, monitoring closely  Skin erosion and ulcerations: stable, dry dressings, still tender to palpation. ?2/2 chronic edema.    -per vascular recommendation: leg elevation and compression, [facility] and changed three times a week, but wound care recommends discharge follow up at wound center and continuation of non-adherent dressings at this time.  -continue IV abx for now, on Vanc and zosyn  ESRD onHD (T/Thu/S): HD done yesterday. Weight up to 313lb's today, from 306lb yesterday -HD per schedule, appreciate renal following. Trying for aggressive volume removal but will be limited by BP -last PTH 397 08/2012, stopped  doxercalciferol and start sensipar  A Fib: HR stable. He is on amiodarone 200 mg daily at home and Coumadin.  INR 1.42 today -coumadin per pharmacy -continue amiodarone  DM-II: A1c 4.9 on 12/13/12, which may not be accurate due to dialysis. Patient is taking Lantus 45 unit at bedtime daily.  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    The patient does have a current PCP Evelena Peat(Alex Wilson, DO) and [facility] after [facility].  The patient does not have transportation limitations that hinder transportation to clinic appointments.  Services Needed at time of discharge: Y = Yes, Blank = No PT:   OT:   RN:   Equipment:   Other:     LOS: 3 days   Darden PalmerSamaya Nabiha Planck, MD 02/06/2013, 11:12  AM

## 2013-02-07 ENCOUNTER — Ambulatory Visit: Payer: Medicaid Other

## 2013-02-07 ENCOUNTER — Encounter (HOSPITAL_COMMUNITY): Payer: Self-pay | Admitting: Urology

## 2013-02-07 DIAGNOSIS — N390 Urinary tract infection, site not specified: Secondary | ICD-10-CM

## 2013-02-07 LAB — GLUCOSE, CAPILLARY
Glucose-Capillary: 132 mg/dL — ABNORMAL HIGH (ref 70–99)
Glucose-Capillary: 154 mg/dL — ABNORMAL HIGH (ref 70–99)
Glucose-Capillary: 177 mg/dL — ABNORMAL HIGH (ref 70–99)
Glucose-Capillary: 179 mg/dL — ABNORMAL HIGH (ref 70–99)

## 2013-02-07 LAB — CBC
HEMATOCRIT: 24.6 % — AB (ref 39.0–52.0)
Hemoglobin: 7.7 g/dL — ABNORMAL LOW (ref 13.0–17.0)
MCH: 28 pg (ref 26.0–34.0)
MCHC: 31.3 g/dL (ref 30.0–36.0)
MCV: 89.5 fL (ref 78.0–100.0)
Platelets: 407 10*3/uL — ABNORMAL HIGH (ref 150–400)
RBC: 2.75 MIL/uL — ABNORMAL LOW (ref 4.22–5.81)
RDW: 17.3 % — AB (ref 11.5–15.5)
WBC: 13.1 10*3/uL — ABNORMAL HIGH (ref 4.0–10.5)

## 2013-02-07 LAB — PROTIME-INR
INR: 1.93 — ABNORMAL HIGH (ref 0.00–1.49)
PROTHROMBIN TIME: 21.5 s — AB (ref 11.6–15.2)

## 2013-02-07 LAB — GC/CHLAMYDIA PROBE AMP
CT Probe RNA: NEGATIVE
GC Probe RNA: NEGATIVE

## 2013-02-07 LAB — RENAL FUNCTION PANEL
Albumin: 1.9 g/dL — ABNORMAL LOW (ref 3.5–5.2)
BUN: 27 mg/dL — AB (ref 6–23)
CO2: 25 meq/L (ref 19–32)
CREATININE: 4.54 mg/dL — AB (ref 0.50–1.35)
Calcium: 8.6 mg/dL (ref 8.4–10.5)
Chloride: 92 mEq/L — ABNORMAL LOW (ref 96–112)
GFR calc Af Amer: 16 mL/min — ABNORMAL LOW (ref >90)
GFR calc non Af Amer: 14 mL/min — ABNORMAL LOW (ref >90)
Glucose, Bld: 169 mg/dL — ABNORMAL HIGH (ref 70–99)
Phosphorus: 3.4 mg/dL (ref 2.3–4.6)
Potassium: 4.1 mEq/L (ref 3.7–5.3)
Sodium: 133 mEq/L — ABNORMAL LOW (ref 137–147)

## 2013-02-07 LAB — PREPARE RBC (CROSSMATCH)

## 2013-02-07 MED ORDER — SODIUM THIOSULFATE 25 % IV SOLN
INTRAVENOUS | Status: DC
  Filled 2013-02-07: qty 100

## 2013-02-07 MED ORDER — SODIUM THIOSULFATE 25 % IV SOLN
25.0000 g | INTRAVENOUS | Status: DC
  Filled 2013-02-07: qty 100

## 2013-02-07 MED ORDER — SODIUM CHLORIDE 0.9 % IV SOLN: INTRAVENOUS | Status: DC

## 2013-02-07 MED ORDER — WARFARIN SODIUM 4 MG PO TABS
4.0000 mg | ORAL_TABLET | Freq: Once | ORAL | Status: AC
  Administered 2013-02-07: 19:00:00 4 mg via ORAL
  Filled 2013-02-07: qty 1

## 2013-02-07 MED ORDER — PIPERACILLIN-TAZOBACTAM 3.375 G IVPB
3.3750 g | Freq: Three times a day (TID) | INTRAVENOUS | Status: DC
  Administered 2013-02-07 – 2013-02-09 (×5): 3.375 g via INTRAVENOUS
  Filled 2013-02-07 (×8): qty 50

## 2013-02-07 MED ORDER — MIDODRINE HCL 5 MG PO TABS
10.0000 mg | ORAL_TABLET | Freq: Two times a day (BID) | ORAL | Status: DC
  Administered 2013-02-08 – 2013-02-13 (×11): 10 mg via ORAL
  Filled 2013-02-07 (×15): qty 2

## 2013-02-07 NOTE — Progress Notes (Signed)
Subjective: Adam [name] feels as though his pain is much improved. He is worried about how much tissue the urologist will have to remove when they take him back to the OR. BP remains soft, though patient does not have dizziness, lightheadedness, CP, SOB.   Objective: Vital signs in last 24 hours: Filed Vitals:   02/06/13 1504 02/06/13 1829 02/06/13 2047 02/07/13 0442  BP: 75/48 74/50 83/44  77/56  Pulse: 78 93 77 87  Temp: 98.2 F (36.8 C)  98.4 F (36.9 C) 97.8 F (36.6 C)  TempSrc: Oral  Oral Oral  Resp: 20  20 18   Height:      Weight:    308 lb 6.4 oz (139.889 kg)  SpO2: 98%  95% 96%   Weight change: -5 lb 1.5 oz (-2.311 kg)  Intake/Output Summary (Last 24 hours) at 02/07/13 0739 Last data filed at 02/07/13 0600  Gross per 24 hour  Intake   1280 ml  Output     25 ml  Net   1255 ml   Physical Exam General: lying in bed, NAD HEENT: [place]/AT, vision grossly intact Lungs: CTAB Heart: RRR, no m/g/r Abdomen: soft, obese, NT, +bs Extremities: 1+ b/l pitting edema in lower extremities; skin blistering/erosions to medial thighs bilaterally with dry dressing in place, legs are diffusely TTP Neurologic: alert & oriented X3, moves all extremities spontaneously GU: dry dressing in place over penile shaft, dried up blood on gauze, no visible pus or drainage, no odor, circumscised penis mild tender to palpation, glans is light in color with a dusky brown macule on L side  Lab Results: Basic Metabolic Panel:  Recent Labs Lab 02/05/13 0430 02/06/13 0513 02/07/13 0400  NA 135* 135* 133*  K 3.6* 4.0 4.1  CL 93* 94* 92*  CO2 27 26 25   GLUCOSE 117* 174* 169*  BUN 15 16 27*  CREATININE 3.32* 3.26* 4.54*  CALCIUM 8.4 8.3* 8.6  MG 1.8  --   --   PHOS 3.6 2.6 3.4   Liver Function Tests:  Recent Labs Lab 02/03/13 0545  02/06/13 0513 02/07/13 0400  AST 9  --   --   --   ALT <5  --   --   --   ALKPHOS 188*  --   --   --   BILITOT 1.6*  --   --   --   PROT 9.1*  --   --   --     ALBUMIN 2.2*  < > 1.9* 1.9*  < > = values in this interval not displayed. CBC:  Recent Labs Lab 02/03/13 0545  02/05/13 0430  02/06/13 0513 02/07/13 0400  WBC 15.9*  < > 14.1*  < > 16.1* 13.1*  NEUTROABS 12.6*  --  11.1*  --   --   --   HGB 8.3*  < > 6.7*  < > 7.8* 7.7*  HCT 27.0*  < > 21.3*  < > 24.5* 24.6*  MCV 90.6  < > 90.3  < > 89.1 89.5  PLT 478*  < > 408*  < > 415* 407*  < > = values in this interval not displayed.  Cardiac Enzymes:  Recent Labs Lab 02/03/13 0545  TROPONINI <0.30   CBG:  Recent Labs Lab 02/05/13 2028 02/06/13 0601 02/06/13 1129 02/06/13 1708 02/06/13 2214 02/07/13 0605  GLUCAP 165* 205* 189* 186* 144* 154*   Coagulation:  Recent Labs Lab 02/03/13 0545 02/05/13 0430 02/06/13 0513 02/07/13 0400  LABPROT 23.7* 18.9* 17.0*  21.5*  INR 2.20* 1.63* 1.42 1.93*   Anemia Panel:  Recent Labs Lab 02/03/13 1525  FERRITIN 275  TIBC 189*  IRON 56   Urine Drug Screen: Drugs of Abuse     Component Value Date/Time   LABOPIA NEG 03/17/2006 1455   COCAINSCRNUR NEG 03/17/2006 1455   LABBENZ NEG 03/17/2006 1455   AMPHETMU NEG 03/17/2006 1455    Micro Results: Recent Results (from the past 240 hour(s))  CULTURE, BLOOD (ROUTINE X 2)     Status: None   Collection Time    02/03/13  5:45 AM      Result Value Range Status   Specimen Description BLOOD ARM LEFT   Final   Special Requests BOTTLES DRAWN AEROBIC AND ANAEROBIC 10CC   Final   Culture  Setup Time     Final   Value: 02/03/2013 13:55     Performed at [facility]   Culture     Final   Value:        BLOOD CULTURE RECEIVED NO GROWTH TO DATE CULTURE WILL BE HELD FOR 5 DAYS BEFORE ISSUING A FINAL NEGATIVE REPORT     Performed at [facility]   Report Status PENDING   Incomplete  CULTURE, BLOOD (ROUTINE X 2)     Status: None   Collection Time    02/03/13  5:52 AM      Result Value Range Status   Specimen Description BLOOD HAND LEFT   Final   Special Requests BOTTLES  DRAWN AEROBIC ONLY 8CC   Final   Culture  Setup Time     Final   Value: 02/03/2013 13:54     Performed at [facility]   Culture     Final   Value:        BLOOD CULTURE RECEIVED NO GROWTH TO DATE CULTURE WILL BE HELD FOR 5 DAYS BEFORE ISSUING A FINAL NEGATIVE REPORT     Performed at [facility]   Report Status PENDING   Incomplete  MRSA PCR SCREENING     Status: Abnormal   Collection Time    02/03/13 12:07 PM      Result Value Range Status   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 It is not     intended to diagnose MRSA     infection nor to guide or     monitor treatment for     MRSA infections.     RESULT CALLED TO, READ BACK BY AND VERIFIED WITH:     A. BRAKE RN 14:30 02/03/13 ([email or web address])  WOUND CULTURE     Status: None   Collection Time    02/04/13 10:38 AM      Result Value Range Status   Specimen Description WOUND PENIS   Final   Special Requests NONE   Final   Gram Stain     Final   Value: FEW WBC PRESENT,BOTH PMN AND MONONUCLEAR     RARE SQUAMOUS EPITHELIAL CELLS PRESENT     MODERATE GRAM NEGATIVE RODS     MODERATE GRAM POSITIVE COCCI IN PAIRS     Performed at [facility]   Culture     Final  Value: MULTIPLE ORGANISMS PRESENT, NONE PREDOMINANT     Note: NO STAPHYLOCOCCUS AUREUS ISOLATED NO GROUP A STREP (S.PYOGENES) ISOLATED     Performed at [facility]   Report Status 02/06/2013 FINAL   Final   Medications: I have reviewed the patient's current medications. Scheduled Meds: . amiodarone  200 mg Oral Daily  . Chlorhexidine Gluconate Cloth  6 each Topical Q0600  . cinacalcet  30 mg Oral Q breakfast  . colchicine  0.6 mg Oral Q3 days  . darbepoetin (ARANESP) injection - DIALYSIS  200 mcg Intravenous Q Thu-HD  . diclofenac sodium  2 g Topical QID  .  Febuxostat  1 tablet Oral Daily  . ferric gluconate (FERRLECIT/NULECIT) IV  125 mg Intravenous Q T,Th,Sa-HD  . insulin aspart  0-9 Units Subcutaneous TID WC  . insulin glargine  45 Units Subcutaneous QHS  . LORazepam  0.5 mg Oral Daily  . multivitamin  1 tablet Oral QHS  . mupirocin ointment  1 application Nasal BID  . pantoprazole  40 mg Oral QAC breakfast  . piperacillin-tazobactam (ZOSYN)  IV  2.25 g Intravenous Q8H  . sevelamer carbonate  1,600 mg Oral TID WC  . traMADol  100 mg Oral BID  . [START ON 02/08/2013] vancomycin  1,000 mg Intravenous Q T,Th,Sa-HD  . 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 Patient has been afebrile over 24h and white count significantly downtrending. 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 Wound culture negative, no staph or strep. BCx NGTD, HIV non-reactive. -d/c vanc  -continue zosyn (day 3) -pt back to OR per urology -GC/Chlamydia pending  UTI- UA dirty, likely gram negative infection.  -continue zosyn -UCx pending  Skin erosion and ulcerations to L medial leg: Appear to be healing, no drainage. Do not believe these wounds are infected. Skin breakdown most likely ?2/2 chronic edema.    -per vascular recommendation: leg elevation and compression, likely will benefit from [facility] boot as outpatient and changed three times a week, but wound care recommends discharge follow up at wound center and continuation of non-adherent dressings at this time.   ESRD onHD (T/Thu/S): Last HD session was 1/10. Weight down to 308 from 313lbs yesterday. Will plan for next HD session tomorrow. Efforts to decrease EDW have not been successful 2/2  hypotension. Pt oliguric with 25cc UOP over the last 24hrs (w/ foley in). Bladder scan this AM showed no residual urine in the bladder.  A Fib: HR stable. He is on amiodarone 200 mg daily at home and Coumadin.  INR 1.93 today -coumadin per pharmacy -continue amiodarone  DM-II: A1c 4.9 on 12/13/12, which may not be accurate due to dialysis. Patient takes Lantus 45U qHS at home.  CBGs 144-189.  -continue Lantus 45 U qHS -SSI   CHF with EF of 25%: BW was 318 on 09/25/12-->318lb on admission, today BW is 306lbs. 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    The patient does have a current PCP Adam Peat, DO) and does need an [facility] hospital follow-up appointment after discharge.  The patient  does not have transportation limitations that hinder transportation to clinic appointments.  Services Needed at time of discharge: Y = Yes, Blank = No PT:   OT:   RN:   Equipment:   Other:     LOS: 4 days   Windell Hummingbird, MD 02/07/2013, 7:39 AM

## 2013-02-07 NOTE — Progress Notes (Signed)
Dressing to penis changed. Moderate amount of serosanguinous drainage noted.  Moist gauze and abdominal pads to perineal area. Patient tolerated well.  Currently resting with no signs of distress. Will continue to monitor. Adam [name]

## 2013-02-07 NOTE — Progress Notes (Signed)
Dressing on left thigh changed, pt does not want ace wrap around the Kerlix.  Left dressing left without ace wrap per patients request.  Will continue to monitor.

## 2013-02-07 NOTE — Progress Notes (Signed)
ANTICOAGULATION CONSULT NOTE - Follow Up Consult  Pharmacy Consult:  Coumadin Indication: atrial fibrillation  Allergies  Allergen Reactions  . Argatroban Other (See Comments)    Ventricular tachycardia  . Ace Inhibitors     Acute renal failure with multiple trials  . Heparin     Thrombcytopenia. SRA positive. See miscellaneous test (SRA results)    Patient Measurements: Height: 6' (182.9 cm) Weight: 308 lb 6.4 oz (139.889 kg) (bed scale) IBW/kg (Calculated) : 77.6  Vital Signs: Temp: 97.8 F (36.6 C) (01/12 0442) Temp src: Oral (01/12 0442) BP: 77/56 mmHg (01/12 0442) Pulse Rate: 87 (01/12 0442)  Labs:  Recent Labs  02/05/13 0430 02/05/13 2023 02/06/13 0513 02/07/13 0400  HGB 6.7* 7.7* 7.8* 7.7*  HCT 21.3* 24.4* 24.5* 24.6*  PLT 408* 378 415* 407*  LABPROT 18.9*  --  17.0* 21.5*  INR 1.63*  --  1.42 1.93*  CREATININE 3.32*  --  3.26* 4.54*    Estimated Creatinine Clearance: 28.8 ml/min (by C-G formula based on Cr of 4.54).     Assessment: 47 YOM to continue on Coumadin for Afib.  INR approaching goal; no bleeding reported.   Goal of Therapy:  INR 2-3 Monitor platelets by anticoagulation protocol: Yes    Plan:  - Coumadin 4mg  PO today - Daily PT / INR - Zosyn 2.25gm IV Q8H    Mccauley Diehl D. Laney Potash, PharmD, BCPS Pager:  [phone] 02/07/2013, 12:55 PM

## 2013-02-07 NOTE — Discharge Summary (Signed)
Name: Adam [name] MRN: [id number] DOB: 07-21-1965 48 y.o. PCP: Evelena Peat, DO  Date of Admission: 02/03/2013  5:15 AM Date of Discharge: 02/16/2013 Attending Physician: Dr. Margarito Liner  Discharge Diagnosis: Principal Problem:   Necrosis- possible calciphylaxis vs coumadin necrosis Active Problems:   DYSLIPIDEMIA   OBSTRUCTIVE SLEEP APNEA   GERD   ICD (implantable cardioverter-defibrillator), single, in situ; 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     amiodarone 200 MG tablet  Commonly known as:  PACERONE  Take 200 mg by mouth daily.     colchicine 0.6 MG tablet  Take 1 tablet (0.6 mg total) by mouth See admin instructions. Takes every three days.     diclofenac sodium 1 % Gel  Commonly known as:  VOLTAREN  Apply 2 g topically 4 (four) times daily.     esomeprazole 20 MG capsule  Commonly known as:  NEXIUM  Take 20 mg by mouth daily at 12 noon. TAKE ONE CAPSULE EVERY DAY BEFORE BREAKFAST     Febuxostat 80 MG Tabs  Commonly known as:  ULORIC  Take 1 tablet (80 mg total) by mouth daily.     fluticasone 50 MCG/ACT nasal spray  Commonly known as:  FLONASE  Place 1 spray into both nostrils daily as needed for allergies. For allergies     HYDROcodone-acetaminophen 7.5-325 MG per tablet  Commonly known as:  NORCO  Take 1-2 tablets by mouth every 4 (four) hours as needed for moderate pain  or severe pain.     Insulin Glargine 100 UNIT/ML Solostar Pen  Commonly known as:  LANTUS SOLOSTAR  Inject 28 Units into the skin daily at 10 pm.     loratadine 10 MG tablet  Commonly known as:  CLARITIN  Take 10 mg by mouth daily as needed for allergies.     LORazepam 0.5 MG tablet  Commonly known as:  ATIVAN  Take 0.5 mg by mouth daily. TAKE 1 TABLET TWICE A DAY AS NEEDED     midodrine 10 MG tablet  Commonly known as:  PROAMATINE  Take 1 tablet (10 mg total) by mouth 3 (three) times daily with meals.     potassium chloride SA 20 MEQ tablet  Commonly known as:  K-DUR,KLOR-CON  Take 1 tablet (20 mEq total) by mouth 2 (two) times daily.     traMADol 50 MG tablet  Commonly known as:  ULTRAM  Take 100 mg by mouth 2 (two) times daily.        Disposition and follow-up:   Mr.Dinnis FLAVIUS WITMER was discharged from [facility] in Stable condition.  At the hospital follow up visit please address:  1.  Monitor for infection as patient has open skin wounds to his legs and is at increased  risk of infection  2. Coumadin- this was discontinued on 02/15/2013 to prevent worsening of calciphylaxis vs treatment of possible coumadin necrosis; patient does have A fib and is a stroke risk; please weigh the risks and benefits of continuing to hold; i would recommend that if patient does not improve within the next few weeks, that this issue should be addressed 3. 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 Please consider performing a skin biopsy if he does not improve or he deteriorates. We did not perform biopsy this admission for fear of nonhealing an possible superinfection w/ sepsis risk.  2.  Labs / imaging needed at time of follow-up: none  3.  Pending labs/ test needing follow-up: none  Follow-up Appointments: Follow-up Information   Call Evelena Peat, DO. (1-2 weeks after discharge from rehab)     Specialty:  Internal Medicine   Contact information:   [address] [place] [address] [phone]       Call Dietrich Pates, MD. (1-2 weeks after discharged from inpatient rehab)    Specialty:  Cardiology   Contact information:   [address] [place] [address] [phone]       Discharge Instructions: Discharge Orders   Future Appointments Provider Department Dept Phone   02/17/2013 8:00 AM Fredia Beets, OT [facility] [phone] [facility] CENTER A [phone]   02/17/2013 9:15 AM Denzil Hughes, PT [facility] [phone] [facility] CENTER A [phone]   02/17/2013 1:00 PM Leone Brand, PT [facility] [phone] [facility] CENTER A [phone]   02/17/2013 1:30 PM Serafina Royals [facility] [phone] [facility] CENTER A [phone]   01/28/2013 9:15 AM Evelena Peat, DO [facility] [phone]   03/02/2013 9:00 AM Marinus Maw, MD [facility] Office [phone]   Future Orders Complete By Expires   Call MD for:  extreme fatigue  As directed    Call MD for:  persistant dizziness or light-headedness  As directed    Call MD for:  severe uncontrolled pain  As directed    Call MD for:  temperature >100.4  As directed    Diet - low sodium heart healthy  As directed    Increase activity slowly  As directed       Consultations: Treatment Team:  Sadie Haber, MD- nephrology Kathi Ludwig, MD- urology [facility]  Procedures Performed:  Dg Chest 2 View  02/03/2013   CLINICAL DATA:  Short of breath.  Dialysis patient  EXAM: CHEST  2 VIEW  COMPARISON:  09/10/2012.  FINDINGS: Marked cardiac enlargement without fluid overload. Negative for edema or effusion. Negative for pneumonia. AICD unchanged in position.  IMPRESSION: Marked cardiac enlargement.  No acute abnormality   Electronically Signed   By: Marlan Palau M.D.   On: 02/03/2013 07:06   Ct Pelvis Wo  Contrast  02/03/2013   CLINICAL DATA:  Wounds on the left thigh and dorsum of the left foot. Blistering left leg. Elevated white blood cell count.  EXAM: CT TIBIA FIBULA LEFT WITHOUT CONTRAST; CT OF THE LEFT FEMUR WITHOUT CONTRAST; CT PELVIS WITHOUT CONTRAST  TECHNIQUE: Multidetector CT imaging of the pelvis, left femur and left lower leg was performed following the standard protocol without intravenous contrast.  COMPARISON:  None.  FINDINGS: PELVIS:  There is mild infiltration of subcutaneous fat about the pelvis. No focal fluid  collection is identified and there is no fluid collection within the pelvis itself. There is no lymphadenopathy. No bony destructive change or other bony abnormality is identified. No hip joint effusion is seen.  LEFT THIGH:  The right thigh is partially visualized on the axial and coronal images. Cutaneous tissues appear thickened bilaterally. There is also infiltration of subcutaneous fat bilaterally which appears fairly symmetric. No focal fluid collection is identified. Fat planes within muscle are preserved. Multiple varicosities are noted. No muscle or tendon tear is seen. No radiopaque foreign body is identified. There is atherosclerotic vascular disease. No bony destructive change or other focal bony lesion is identified.  LEFT LOWER LEG:  Cutaneous tissues appear thickened about the medial margin of the left lower leg where there appear to be blisters at the skin surface. There is some infiltration of subcutaneous fat about the lower legs bilaterally, worse on the left. Fat planes within muscle are preserved. No focal fluid collection is seen in the subcutaneous or muscular tissues. No bony destructive change or other focal bony lesion is present. All visualized muscles and tendons appear intact.  IMPRESSION: Findings compatible cellulitis and/or dependent change about both legs and the pelvis. Blistering along the skin surface is seen in the left lower leg. No abscess is  identified and there is no CT evidence of osteomyelitis.  Atherosclerosis.   Electronically Signed   By: Drusilla Kanner M.D.   On: 02/03/2013 08:30   Ct Femur Left Wo Contrast  02/03/2013   CLINICAL DATA:  Wounds on the left thigh and dorsum of the left foot. Blistering left leg. Elevated white blood cell count.  EXAM: CT TIBIA FIBULA LEFT WITHOUT CONTRAST; CT OF THE LEFT FEMUR WITHOUT CONTRAST; CT PELVIS WITHOUT CONTRAST  TECHNIQUE: Multidetector CT imaging of the pelvis, left femur and left lower leg was performed following the standard protocol without intravenous contrast.  COMPARISON:  None.  FINDINGS: PELVIS:  There is mild infiltration of subcutaneous fat about the pelvis. No focal fluid collection is identified and there is no fluid collection within the pelvis itself. There is no lymphadenopathy. No bony destructive change or other bony abnormality is identified. No hip joint effusion is seen.  LEFT THIGH:  The right thigh is partially visualized on the axial and coronal images. Cutaneous tissues appear thickened bilaterally. There is also infiltration of subcutaneous fat bilaterally which appears fairly symmetric. No focal fluid collection is identified. Fat planes within muscle are preserved. Multiple varicosities are noted. No muscle or tendon tear is seen. No radiopaque foreign body is identified. There is atherosclerotic vascular disease. No bony destructive change or other focal bony lesion is identified.  LEFT LOWER LEG:  Cutaneous tissues appear thickened about the medial margin of the left lower leg where there appear to be blisters at the skin surface. There is some infiltration of subcutaneous fat about the lower legs bilaterally, worse on the left. Fat planes within muscle are preserved. No focal fluid collection is seen in the subcutaneous or muscular tissues. No bony destructive change or other focal bony lesion is present. All visualized muscles and tendons appear intact.  IMPRESSION:  Findings compatible cellulitis and/or dependent change about both legs and the pelvis. Blistering along the skin surface is seen in the left lower leg. No abscess is identified and there is no CT evidence of osteomyelitis.  Atherosclerosis.   Electronically Signed   By: Drusilla Kanner M.D.   On: 02/03/2013 08:30   Ct Tibia Fibula Left Wo Contrast  02/03/2013   CLINICAL DATA:  Wounds on the left thigh and dorsum of the left foot. Blistering left leg. Elevated white blood cell count.  EXAM: CT TIBIA FIBULA LEFT WITHOUT CONTRAST; CT OF THE LEFT FEMUR WITHOUT CONTRAST; CT PELVIS WITHOUT CONTRAST  TECHNIQUE: Multidetector CT imaging of the pelvis, left femur and left lower leg was performed following the standard protocol without intravenous contrast.  COMPARISON:  None.  FINDINGS: PELVIS:  There is mild infiltration of subcutaneous fat about the pelvis. No focal fluid collection is identified and there is no fluid collection within the pelvis itself. There is no lymphadenopathy. No bony destructive change or other bony abnormality is identified. No hip joint effusion is seen.  LEFT THIGH:  The right thigh is partially visualized on the axial and coronal images. Cutaneous tissues appear thickened bilaterally. There is also infiltration of subcutaneous fat bilaterally which appears fairly symmetric. No focal fluid collection is identified. Fat planes within muscle are preserved. Multiple varicosities are noted. No muscle or tendon tear is seen. No radiopaque foreign body is identified. There is atherosclerotic vascular disease. No bony destructive change or other focal bony lesion is identified.  LEFT LOWER LEG:  Cutaneous tissues appear thickened about the medial margin of the left lower leg where there appear to be blisters at the skin surface. There is some infiltration of subcutaneous fat about the lower legs bilaterally, worse on the left. Fat planes within muscle are preserved. No focal fluid collection is seen  in the subcutaneous or muscular tissues. No bony destructive change or other focal bony lesion is present. All visualized muscles and tendons appear intact.  IMPRESSION: Findings compatible cellulitis and/or dependent change about both legs and the pelvis. Blistering along the skin surface is seen in the left lower leg. No abscess is identified and there is no CT evidence of osteomyelitis.  Atherosclerosis.   Electronically Signed   By: Drusilla Kanner M.D.   On: 02/03/2013 08:30   Admission HPI:  Patient is 48 yo man with PMH of ESRD on HD (T/thu/S, CHF (EF 25%), DM-II, sleep apnea, gout, A fib on coumadin (INR 2.2), who presents with penis pain and discharge as well as leg pain.  Patient reports that he started having penile discharge and pain about 2 weeks ago. The discharge was clear initially. He also has itches. He scratched and caused small skin laceration in last Sunday. He came to the ED and had suture done in ED. After he went home, his penile discharge and pain has been progressively getting worse. He has not been sexually active for more than 6 months. He does not feel hot and no chills, but his temperature was 100.3 in Ed. He was found to have leukocytosis with WBC 15.9 and SBP at 80s in ED (patient's BP has been always running low).  Patient has chronic leg swelling and left foot ulcer. The foot ulcer has been treated by home health nurse. His left foot ulcer has been improving. Regarding his leg edema, he feels it is getting worse. He has pain in his legs, worse on the left upper thigh. He developed new skin ulcers over the left leg which is medial to the left knee. The ulcer is painful per patient.  Patient reports that he has been taking all his medications regularly. His shortness of breath is at his baseline. BW is stable.  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 He  underwent circumcision and debridement on 02/04/13 by urology. Patient developed sepsis thought to be a result of balanitis. He is chronically hypotensive, though was more hypotensive on admission than usual. He also had leukocytosis that peaked at 18.2 and gradually trended down. He remained afebrile, though was intermittently tachycardic (though in and out of A fib). Wound culture showed multiple organisms present, no staph aureus isolated, no group a strep isolated. UA showed >300 protein, positive nitrite, large leukocytes and Hgb. UCx showed multiple bacterial morphotypes present without any predominant, BCx negative x 2, HIV non-reactive. GC/Chlamydia negative. Patient was managed on augmentin x 2 days, vanc/zosyn x 1 day, zosyn only x 4 days (total abx course 7 days). Patient was followed closely by urology who did not think patient needed to return for further debriedment. However, they do think that part of his glans penis and perhaps part of his penile shaft is dry and necrotic and may eventually fall off on its own. Patient's penis did not show signs of infection once antibiotics were discontinued. Patient was evaluated by urology on 02/15/2013 at which point they thought that perhaps the necrosis of glans is spreading to his urethra and possibly causing urinary retention as patient has not had any UOP over the last few days. Bladder scan showed 65cc urine. I believe this is a possibility, though patient had been receiving daily HD during this time period, which can also explain his decreased UOP (baseline daily UOP had been approx 50cc).  Bilateral leg induration w/ erosion, ulcerations to L medial leg, likely calciphylaxis vs coumadin necrosis: Patient admitted with worsening induration to his upper, inner thighs x a few weeks. Patient has had chronic lower leg skin induration and thickening, but the upper thigh induration was new. Also w/ small area of skin breakdown on L inner leg that progressed over  the course of his hospitalization. L leg xray negative for osteomyelitis. Patient's picture was concerning for calciphylaxis vs coumadin necrosis, though his story did not fit either exactly. Patient is compliant with coumadin and had not had recent dosing changes prior to his admission, which makes coumadin necrosis less likely. However, his PTH was normal (56.5) and Ca/Phos levels were not consistent with calciphylaxis either. Therefore, patient was first started on treatment for calciphylaxis: daily dialysis for 7 days- then he received 3 more days of dialysis with a plan to do 5 days per week, sodium thiosulfate 25g IV in NS over 30-60 mins at the end of each HD session, high flow oxygen (15LPM via venti mask) x 2hr daily, Ca/vit D supplements were stopped, blood transfusion to keep Hb 9.5-10.5 (transfused approx 3U pRBCs, this was done in HD, so I don't have the exact count), continued cinacalcet and sevelamer. Consulted cardiology and they recommend continuing coumadin given the risk of stroke outweigh the benefit of stopping at this point; however, a long conversation with nephrology, we decided to discontinue coumadin on 02/15/2013. We decided that patient's clinical picture (especially with some penile necrosis) may look more consistent with coumadin necrosis than calciphylaxis. Additionally, coumadin can worsen calciphylaxis. Therefore, we discussed the risks/benefits of stopping coumadin and patient agreed to stop this medication at least temporarily. We don't have a clear date to restart this medication as it will depend on patient's clinical response. This is something that should be addressed at his hospital follow up visits. Also, keep in mind that there is controversial evidence of anticoagulation in A fib patients who are also on  HD for ESRD. We discussed performing a punch biopsy with the hope of finding a definitive diagnosis. However, we decided that patient was a poor candidate given he is  at high risk for nonhealing and possible sepsis, which is the main cause of death in calciphylaxis patients. Skin punch biopsy may be considered if patient does not improve further. Overall, during his hospitalization patient had initial expansion of his L inner thigh skin erosion and development of a small area of erosion on R inner thigh. However, these areas of skin breakdown remained stable and were properly dressed by wound care. There were never any signs of infection to these areas. Patient also with hardening and necrosis of the glans penis, thought originally to be 2/2 balanitis, though eventually thought to be a result of his calciphylaxis vs coumadin necrosis.   UTI- Resolved. UA dirty on admission, likely gram negative infection. Patient received gram negative antibiotic coverage x 7 days total as per above for balanitis. UCx showed multiple bacterial morphotypes present without any predominant.  ESRD onHD (T/Thu/S): HD done almost daily during his admission for treatment of possible calciphylaxis as per above. Weight was stable and volume status was managed by nephrology. Patient remains fluid overloaded, though attempts to decrease patient's EDW were limited 2/2 hypotension. During his admission he was started on midodrine 10mg  TID, which improved his blood pressure and tolerance to HD.  A Fib: HR intermittently elevated and patient in and out of A fib during his hospitalization. He was on amiodarone 200 mg daily and Coumadin at home prior to admission. Amiodarone and coumadin were continued. INR had been subtherapeutic for a few days after it had been held for surgery, but quickly became therapeutic once it was restarted. Cardiology was consulted and they recommended continuing coumadin. However, patient did not improve significantly with regard to his calciphylaxis after intensive treatment x 10 days. Therefore, in conjunction with nephrology we decided to do trial of discontinuation of  coumadin given possibility that coumadin can prevent healing or can worsen calciphylaxis. Additionally, we were unable to rule out coumadin necrosis as a cause for his skin necrosis. Risks and benefits of continuing coumadin were weighed and discussed with patient. Patient is in agreement with our plan. I attempted to touch base with Dr. Tenny Crawoss (patient's cardiologist) regarding our plan, though she was out of the office when I called. He will need close cardiology follow up when he leaves [facility].  DM-II: A1c 4.9 on 12/13/12, which may not be accurate due to dialysis. Patient is taking Lantus 45 unit at bedtime daily. Patient not eating normal amount or diet, therefore, lantus was decreased and he was maintained on lantus 28U with a SSI. CBGs well controlled (mid 100s).  CHF with EF of 25%: BW and volume status stable. No acute exacerbation during his hospitalization. Attemp to remove excess fluid with HD was limited by BP (80s-low 100s/60s).  Discharge Vitals:   BP 100/72  Pulse 108  Temp(Src) 97.5 F (36.4 C) (Oral)  Resp 20  Ht 6' (1.829 m)  Wt 307 lb 1.6 oz (139.3 kg)  BMI 41.64 kg/m2  SpO2 94%  Discharge Labs:  Results for orders placed during the hospital encounter of 02/03/13 (from the past 24 hour(s))  GLUCOSE, CAPILLARY     Status: Abnormal   Collection Time    02/15/13  9:05 PM      Result Value Range   Glucose-Capillary 130 (*) 70 - 99 mg/dL  PROTIME-INR     Status:  Abnormal   Collection Time    02/16/13  6:12 AM      Result Value Range   Prothrombin Time 24.8 (*) 11.6 - 15.2 seconds   INR 2.33 (*) 0.00 - 1.49  CBC     Status: Abnormal   Collection Time    02/16/13  6:12 AM      Result Value Range   WBC 11.3 (*) 4.0 - 10.5 K/uL   RBC 3.16 (*) 4.22 - 5.81 MIL/uL   Hemoglobin 9.2 (*) 13.0 - 17.0 g/dL   HCT 16.1 (*) 09.6 - 04.5 %   MCV 91.5  78.0 - 100.0 fL   MCH 29.1  26.0 - 34.0 pg   MCHC 31.8  30.0 - 36.0 g/dL   RDW 40.9 (*) 81.1 - 91.4 %   Platelets 351  150 - 400  K/uL  RENAL FUNCTION PANEL     Status: Abnormal   Collection Time    02/16/13  6:12 AM      Result Value Range   Sodium 137  137 - 147 mEq/L   Potassium 5.1  3.7 - 5.3 mEq/L   Chloride 92 (*) 96 - 112 mEq/L   CO2 20  19 - 32 mEq/L   Glucose, Bld 146 (*) 70 - 99 mg/dL   BUN 20  6 - 23 mg/dL   Creatinine, Ser 7.82 (*) 0.50 - 1.35 mg/dL   Calcium 9.2  8.4 - 95.6 mg/dL   Phosphorus 3.7  2.3 - 4.6 mg/dL   Albumin 1.8 (*) 3.5 - 5.2 g/dL   GFR calc non Af Amer 23 (*) >90 mL/min   GFR calc Af Amer 27 (*) >90 mL/min  GLUCOSE, CAPILLARY     Status: Abnormal   Collection Time    02/16/13  6:17 AM      Result Value Range   Glucose-Capillary 138 (*) 70 - 99 mg/dL  GLUCOSE, CAPILLARY     Status: Abnormal   Collection Time    02/16/13 11:17 AM      Result Value Range   Glucose-Capillary 151 (*) 70 - 99 mg/dL   Comment 1 Documented in Chart     Comment 2 Notify RN    GLUCOSE, CAPILLARY     Status: Abnormal   Collection Time    02/16/13  4:31 PM      Result Value Range   Glucose-Capillary 176 (*) 70 - 99 mg/dL   Comment 1 Documented in Chart     Comment 2 Notify RN      Signed: Windell Hummingbird, MD 02/16/2013, 8:34 PM   Time Spent on Discharge: 35 minutes Services Ordered on Discharge: [facility] Equipment Ordered on Discharge: none

## 2013-02-07 NOTE — Consult Note (Signed)
Notified primary RN at bedside today that WOC will hold off on consult for penile nec. Fasciitis until urology evaluates this patient today for further surgical intervention.  Feel that wound care at this time needs to managed by urology especially if further surgery planned, would be glad to evaluate wound with urology today and make recommendation for wound care moving forward if no surgery is indicated.  Bedside nurse to notify urologist at the time that they make rounds to see patient today.  Davina Poke RN, CWOCN [phone]

## 2013-02-07 NOTE — Progress Notes (Signed)
Pt received two units of PRBCs, tolerated the transfusion well no rxn noted; pt denies itching, SOB, weakness and dizziness and pain.

## 2013-02-07 NOTE — Progress Notes (Signed)
[facility] Progress Note   Subjective: "I'm scared, doc".    Filed Vitals:   02/06/13 1504 02/06/13 1829 02/06/13 2047 02/07/13 0442  BP: 75/48 74/50 83/44  77/56  Pulse: 78 93 77 87  Temp: 98.2 F (36.8 C)  98.4 F (36.9 C) 97.8 F (36.6 C)  TempSrc: Oral  Oral Oral  Resp: 20  20 18   Height:      Weight:    139.889 kg (308 lb 6.4 oz)  SpO2: 98%  95% 96%  Exam Alert, no distress ++ jvd Crackles L base, R clear RRR no MRG Abd soft, obese, nt/nd Marked induration with darkening of skin L  > R lower ext, serpentine skin necrosis pattern inside or L leg, both legs very tender to touch in affected areas. Sparing of bilat feet Penile discoloration w yellowish/ischemic appearance, scrotal changes similar to LE's Neuro is nf, ox3 R forearm avf is patent  ECHO 12/13 > EF 25%, diffuse HK, severe RV dilatation/reduced fxn  [facility] 4.75h   F180   Bath 2K/2.25Ca  DW 143.5kg   RFA AVF   No heparin    EPO 22K   Hect 6ug   No Fe at center  Assessment/Plan: 1. 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 Current rec's per UTD are Na thiosulfate and prob more importantly in my opinion very aggressive dialysis (daily for first week then 5-6d per week).  Don't think a biopsy is indicated given clinical presentation, recommend proceeding with frequent HD and Na thiosulfate. Also stop IV iron for now. Check pth.  Daily highflow O2.  Transfuse to keep Hb 9.5-10.5 range.  On sensipar and renvela, no vit D or Ca-containing meds.  Warfarin discontinuation is to be considered but may impose other risks so would hold off for now.  Given other comorbidities his prognosis with this illness if guarded.  2. ESRD: HD today and as above 3. Chronic hypotension/volume: below dry wt with edema, jvd. BP's very low, lungs clear, no UF with HD today. STOP IVF"s, add midodrine bid 4. NICM / bivent HF / LVEF  25% / AICD 5. Pacemaker 6. Morbid obesity  Vinson Moselleob [place] MD pager [phone]    cell [phone] 02/07/2013, 11:08 AM   Recent Labs Lab 02/05/13 0430 02/06/13 0513 02/07/13 0400  NA 135* 135* 133*  K 3.6* 4.0 4.1  CL 93* 94* 92*  CO2 27 26 25   GLUCOSE 117* 174* 169*  BUN 15 16 27*  CREATININE 3.32* 3.26* 4.54*  CALCIUM 8.4 8.3* 8.6  PHOS 3.6 2.6 3.4    Recent Labs Lab 02/03/13 0545 02/05/13 0430 02/06/13 0513 02/07/13 0400  AST 9  --   --   --   ALT <5  --   --   --   ALKPHOS 188*  --   --   --   BILITOT 1.6*  --   --   --   PROT 9.1*  --   --   --   ALBUMIN 2.2* 1.8* 1.9* 1.9*    Recent Labs Lab 02/03/13 0545  02/05/13 0430 02/05/13 2023 02/06/13 0513 02/07/13 0400  WBC 15.9*  < > 14.1* 18.2* 16.1* 13.1*  NEUTROABS 12.6*  --  11.1*  --   --   --   HGB 8.3*  < > 6.7* 7.7* 7.8* 7.7*  HCT 27.0*  < > 21.3* 24.4* 24.5* 24.6*  MCV 90.6  < > 90.3  88.1 89.1 89.5  PLT 478*  < > 408* 378 415* 407*  < > = values in this interval not displayed. Marland Kitchen amiodarone  200 mg Oral Daily  . Chlorhexidine Gluconate Cloth  6 each Topical Q0600  . cinacalcet  30 mg Oral Q breakfast  . colchicine  0.6 mg Oral Q3 days  . darbepoetin (ARANESP) injection - DIALYSIS  200 mcg Intravenous Q Thu-HD  . diclofenac sodium  2 g Topical QID  . Febuxostat  1 tablet Oral Daily  . ferric gluconate (FERRLECIT/NULECIT) IV  125 mg Intravenous Q T,Th,Sa-HD  . insulin aspart  0-9 Units Subcutaneous TID WC  . insulin glargine  45 Units Subcutaneous QHS  . LORazepam  0.5 mg Oral Daily  . multivitamin  1 tablet Oral QHS  . mupirocin ointment  1 application Nasal BID  . pantoprazole  40 mg Oral QAC breakfast  . piperacillin-tazobactam (ZOSYN)  IV  2.25 g Intravenous Q8H  . sevelamer carbonate  1,600 mg Oral TID WC  . traMADol  100 mg Oral BID  . Warfarin - Pharmacist Dosing Inpatient   Does not apply q1800   . sodium chloride     acetaminophen, fluticasone, HYDROcodone-acetaminophen,  loratadine

## 2013-02-07 NOTE — Progress Notes (Signed)
  Date: 02/07/2013  Patient name: Adam [name]  Medical record number: [id number]  Date of birth: [birth year]   This patient has been seen and the plan of care was discussed with the house staff. Please see their note for complete details. I concur with their findings with the following additions/corrections:  Per renal notes, will plan for HD today and daily for first week for concern for calciphylaxis of the leg and possible penis?  Also appears Urology plans for OR today, however, there is not note to that effect as of yet.  He remains on Zosyn for his Necrosis of the penis and skin wounds on the leg along with a UTI as noted on UA.    Inez Catalina, MD 02/07/2013, 1:38 PM

## 2013-02-07 NOTE — Progress Notes (Signed)
Patient remained oliguric throughout the night and SBP remained soft.  Patient is asymptomatic. Will continue to monitor. Adam [name]

## 2013-02-07 NOTE — Care Management Note (Unsigned)
    Page 1 of 1   02/07/2013     5:42:18 PM   CARE MANAGEMENT NOTE 02/07/2013  Patient:  Adam [name], Adam [name]   Account Number:  [id number]  Date Initiated:  02/07/2013  Documentation initiated by:  HUTCHINSON,CRYSTAL  Subjective/Objective Assessment:   Admitted with Balanitis and ganegreen of penis.     Action/Plan:   CM will continue to monitor for dispositon needs.   Anticipated DC Date:  02/10/2013   Anticipated DC Plan:  HOME W HOME HEALTH SERVICES      DC Planning Services  CM consult      Choice offered to / List presented to:             Status of service:  In process, will continue to follow Medicare Important Message given?   (If response is "NO", the following Medicare IM given date fields will be blank) Date Medicare IM given:   Date Additional Medicare IM given:    Discharge Disposition:    Per UR Regulation:  Reviewed for med. necessity/level of care/duration of stay  If discussed at Long Length of Stay Meetings, dates discussed:    Comments:  02/07/2013 Last UR 02/06/2013 PT Recs: Home health PT;Supervision - Intermittent Zosyn for his Necrosis of the penis and skin wounds on the leg (Last surgery date 02/04/2013), UTI , HD daily ADD:  +3 D/c plan pending. Crystal Hutchinson RN,  BSN, [place], [place] 02/07/2013

## 2013-02-07 NOTE — Progress Notes (Signed)
Urology Progress Note  3 Days Post-Op   Subjective: Fasciitis of the foreskin and glans    No acute urologic events overnight. Ambulation:   negative Flatus:    positive Bowel movement  positive  Pain: some relief  Objective:  Blood pressure 93/55, pulse 80, temperature 97.9 F (36.6 C), temperature source Oral, resp. rate 18, height 6' (1.829 m), weight 140.4 kg (309 lb 8.4 oz), SpO2 96.00%.  Physical Exam:  General:  No acute distress, awake  Genitourinary:  Known evolving eschar on the L glans, which will eventually demarcate and need surgical excision.  Foley:  remains    I/O last 3 completed shifts: In: 1760 [P.O.:1760] Out: 25 [Urine:25]  Recent Labs     02/06/13  0513  02/07/13  0400  HGB  7.8*  7.7*  WBC  16.1*  13.1*  PLT  415*  407*    Recent Labs     02/06/13  0513  02/07/13  0400  NA  135*  133*  K  4.0  4.1  CL  94*  92*  CO2  26  25  BUN  16  27*  CREATININE  3.26*  4.54*  CALCIUM  8.3*  8.6  GFRNONAA  21*  14*  GFRAA  24*  16*     Recent Labs     02/06/13  0513  02/07/13  0400  INR  1.42  1.93*     No components found with this basename: ABG,   Assessment/Plan: I changed his dressing Saturday and Sunday.  Catheter not removed. Continue any current medications. Wound care discussed. Plan to have wound care per wound care team-consult placed yesterday. I will see pt this week for re-evaluation of his penile viability.

## 2013-02-08 LAB — RENAL FUNCTION PANEL
Albumin: 1.8 g/dL — ABNORMAL LOW (ref 3.5–5.2)
Albumin: 1.8 g/dL — ABNORMAL LOW (ref 3.5–5.2)
BUN: 18 mg/dL (ref 6–23)
BUN: 20 mg/dL (ref 6–23)
CALCIUM: 8.2 mg/dL — AB (ref 8.4–10.5)
CHLORIDE: 95 meq/L — AB (ref 96–112)
CO2: 27 mEq/L (ref 19–32)
CO2: 25 meq/L (ref 19–32)
CREATININE: 3.43 mg/dL — AB (ref 0.50–1.35)
Calcium: 8.3 mg/dL — ABNORMAL LOW (ref 8.4–10.5)
Chloride: 94 mEq/L — ABNORMAL LOW (ref 96–112)
Creatinine, Ser: 3.72 mg/dL — ABNORMAL HIGH (ref 0.50–1.35)
GFR calc Af Amer: 23 mL/min — ABNORMAL LOW (ref >90)
GFR calc non Af Amer: 20 mL/min — ABNORMAL LOW (ref >90)
GFR, EST AFRICAN AMERICAN: 21 mL/min — AB (ref >90)
GFR, EST NON AFRICAN AMERICAN: 18 mL/min — AB (ref >90)
GLUCOSE: 95 mg/dL (ref 70–99)
Glucose, Bld: 78 mg/dL (ref 70–99)
POTASSIUM: 3.4 meq/L — AB (ref 3.7–5.3)
Phosphorus: 2.5 mg/dL (ref 2.3–4.6)
Phosphorus: 2.6 mg/dL (ref 2.3–4.6)
Potassium: 3.4 mEq/L — ABNORMAL LOW (ref 3.7–5.3)
SODIUM: 136 meq/L — AB (ref 137–147)
Sodium: 136 mEq/L — ABNORMAL LOW (ref 137–147)

## 2013-02-08 LAB — GLUCOSE, CAPILLARY
GLUCOSE-CAPILLARY: 97 mg/dL (ref 70–99)
GLUCOSE-CAPILLARY: 133 mg/dL — AB (ref 70–99)
Glucose-Capillary: 94 mg/dL (ref 70–99)
Glucose-Capillary: 113 mg/dL — ABNORMAL HIGH (ref 70–99)

## 2013-02-08 LAB — TYPE AND SCREEN
ABO/RH(D): A POS
Antibody Screen: NEGATIVE
UNIT DIVISION: 0
UNIT DIVISION: 0
Unit division: 0
Unit division: 0

## 2013-02-08 LAB — CBC
HEMATOCRIT: 29.4 % — AB (ref 39.0–52.0)
HEMOGLOBIN: 9.4 g/dL — AB (ref 13.0–17.0)
MCH: 28.3 pg (ref 26.0–34.0)
MCHC: 32 g/dL (ref 30.0–36.0)
MCV: 88.6 fL (ref 78.0–100.0)
Platelets: 368 10*3/uL (ref 150–400)
RBC: 3.32 MIL/uL — ABNORMAL LOW (ref 4.22–5.81)
RDW: 18 % — ABNORMAL HIGH (ref 11.5–15.5)
WBC: 11.1 10*3/uL — AB (ref 4.0–10.5)

## 2013-02-08 LAB — PROTIME-INR
INR: 2.05 — ABNORMAL HIGH (ref 0.00–1.49)
Prothrombin Time: 22.5 seconds — ABNORMAL HIGH (ref 11.6–15.2)

## 2013-02-08 LAB — URINE CULTURE: Colony Count: >=100000

## 2013-02-08 LAB — PARATHYROID HORMONE, INTACT (NO CA): PTH: 56.5 pg/mL (ref 14.0–72.0)

## 2013-02-08 MED ORDER — SODIUM THIOSULFATE 25 % IV SOLN: 25.0000 g | INTRAVENOUS | Status: DC

## 2013-02-08 MED ORDER — SODIUM CHLORIDE 0.9 % IV SOLN
Freq: Every day | INTRAVENOUS | Status: DC
  Filled 2013-02-08: qty 100

## 2013-02-08 MED ORDER — ACETAMINOPHEN 325 MG PO TABS
ORAL_TABLET | ORAL | Status: AC
  Filled 2013-02-08: qty 1

## 2013-02-08 MED ORDER — SODIUM THIOSULFATE 25 % IV SOLN
25.0000 g | Freq: Every day | INTRAVENOUS | Status: DC
  Administered 2013-02-08: 25 g via INTRAVENOUS
  Filled 2013-02-08 (×2): qty 100

## 2013-02-08 MED ORDER — WARFARIN SODIUM 4 MG PO TABS
4.0000 mg | ORAL_TABLET | Freq: Once | ORAL | Status: AC
  Administered 2013-02-08: 4 mg via ORAL
  Filled 2013-02-08 (×2): qty 1

## 2013-02-08 MED ORDER — HYDROCODONE-ACETAMINOPHEN 5-325 MG PO TABS
ORAL_TABLET | ORAL | Status: AC
  Administered 2013-02-08: 11:00:00
  Filled 2013-02-08: qty 1

## 2013-02-08 MED ORDER — HYDROCODONE-ACETAMINOPHEN 7.5-325 MG PO TABS
1.0000 | ORAL_TABLET | ORAL | Status: DC | PRN
  Administered 2013-02-08 – 2013-02-10 (×2): 2 via ORAL
  Administered 2013-02-10: 1 via ORAL
  Administered 2013-02-11: 2 via ORAL
  Administered 2013-02-11: 1 via ORAL
  Administered 2013-02-12 – 2013-02-15 (×3): 2 via ORAL
  Filled 2013-02-08: qty 2
  Filled 2013-02-08: qty 1
  Filled 2013-02-08: qty 2
  Filled 2013-02-08: qty 1
  Filled 2013-02-08 (×4): qty 2

## 2013-02-08 NOTE — Progress Notes (Signed)
[facility] Progress Note   Subjective: No complaints  Filed Vitals:   02/08/13 1000 02/08/13 1030 02/08/13 1100 02/08/13 1130  BP: 90/50 93/40 92/63  94/57  Pulse: 102 93 102 107  Temp:      TempSrc:      Resp:      Height:      Weight:      SpO2:      Exam Alert, no distress ++ jvd Crackles L base, R clear RRR no MRG Abd soft, obese, nt/nd Marked induration with darkening of skin L  > R lower ext, serpentine skin necrosis pattern inside or L leg, both legs very tender to touch in affected areas. Sparing of bilat feet Penile discoloration w yellowish/ischemic appearance, scrotal changes similar to LE's Neuro is nf, ox3 R forearm avf is patent  ECHO 12/13 > EF 25%, diffuse HK, severe RV dilatation/reduced fxn  [facility] 4.75h   F180   Bath 2K/2.25Ca  DW 143.5kg   RFA AVF   No heparin    EPO 22K   Hect 6ug   No Fe at center  Assessment/Plan: 1. Penile gangrene / L thigh gangrene / diffuse bilat LE discoloration w induration/pain: clinical presentation c/w calciphylaxis, prognosis guarded. Aggressive dialysis mainstay of Rx, also Na thiosulfate, stopping of Ca/vit D containing meds, stopped IV iron. HD today 2. ESRD: HD 4.5hrs daily x 7 then 5-6x/wk until wounds stabilize 3. Chronic hypotension/volume: below dry wt with edema, jvd. BP's very low, lungs clear, no UF with HD today. Stopped IVF"s, added midodrine bid 4. Anemia/CKD: cont esa, no IV Fe due to #1, transfuse to keep Hb 9.5-10.5 5. NICM / bivent HF / LVEF 25% / AICD 6. Pacemaker 7. Morbid obesity  Vinson Moselle MD pager [phone]    cell [phone] 02/08/2013, 12:09 PM   Recent Labs Lab 02/07/13 0400 02/08/13 0440 02/08/13 0930  NA 133* 136* 136*  K 4.1 3.4* 3.4*  CL 92* 94* 95*  CO2 25 27 25   GLUCOSE 169* 95 78  BUN 27* 18 20  CREATININE 4.54* 3.43* 3.72*  CALCIUM 8.6 8.3* 8.2*  PHOS 3.4 2.5 2.6    Recent Labs Lab 02/03/13 0545  02/07/13 0400 02/08/13 0440 02/08/13 0930   AST 9  --   --   --   --   ALT <5  --   --   --   --   ALKPHOS 188*  --   --   --   --   BILITOT 1.6*  --   --   --   --   PROT 9.1*  --   --   --   --   ALBUMIN 2.2*  < > 1.9* 1.8* 1.8*  < > = values in this interval not displayed.  Recent Labs Lab 02/03/13 0545  02/05/13 0430  02/06/13 0513 02/07/13 0400 02/08/13 0440  WBC 15.9*  < > 14.1*  < > 16.1* 13.1* 11.1*  NEUTROABS 12.6*  --  11.1*  --   --   --   --   HGB 8.3*  < > 6.7*  < > 7.8* 7.7* 9.4*  HCT 27.0*  < > 21.3*  < > 24.5* 24.6* 29.4*  MCV 90.6  < > 90.3  < > 89.1 89.5 88.6  PLT 478*  < > 408*  < > 415* 407* 368  < > = values in this interval not displayed. Marland Kitchen amiodarone  200 mg Oral Daily  . Chlorhexidine Gluconate  Cloth  6 each Topical [id number]  . cinacalcet  30 mg Oral Q breakfast  . colchicine  0.6 mg Oral Q3 days  . darbepoetin (ARANESP) injection - DIALYSIS  200 mcg Intravenous Q Thu-HD  . diclofenac sodium  2 g Topical QID  . Febuxostat  1 tablet Oral Daily  . HYDROcodone-acetaminophen      . insulin aspart  0-9 Units Subcutaneous TID WC  . insulin glargine  45 Units Subcutaneous QHS  . LORazepam  0.5 mg Oral Daily  . midodrine  10 mg Oral BID WC  . multivitamin  1 tablet Oral QHS  . mupirocin ointment  1 application Nasal BID  . pantoprazole  40 mg Oral QAC breakfast  . piperacillin-tazobactam (ZOSYN)  IV  3.375 g Intravenous Q8H  . sevelamer carbonate  1,600 mg Oral TID WC  . traMADol  100 mg Oral BID  . Warfarin - Pharmacist Dosing Inpatient   Does not apply q1800     acetaminophen, fluticasone, HYDROcodone-acetaminophen, loratadine

## 2013-02-08 NOTE — Consult Note (Signed)
WOC wound follow up Wound type: asked per urology to evaluate penile and scrotal wounds.  S/P circumcision and penile glans debridement 02/04/13.  Pt with more extensive necrosis of the glans/frenulum today (it is hard and yellow/black) dry necrosis of the penile shaft and some necrosis noted on the scrotal sac.   The left inner thigh has more necrosis noted from my exam last week, the area is indurated over the inner thigh and posterior thigh and the wounds are 100% dry and necrotic.  There is no drainage from the inner thigh wounds currently. Nephrology noted this may be calciphylaxis and now in looking at the changes in the wounds I agree this could be same. Nephrology is not recommending bx.  Since the area seems to be increasing and the penile/scotal changes are worsening would suggest general surgery to take a look at the thigh to determine if any debridement is indicated.  From a topical wound perspective since this is not open or draining currently will not add any type of care for the thigh.   Measurement: Left thigh: 20cm x 7cm x 0  Penis: 2.0cm x 6.0cm circumferential around the glans and 2cm x 4.5cm on the dorsum of the scrotal sac Wound bed: all areas are 100% necrotic/stable but yellow and black. The tip of the penis is very hardened and I feel it is worse today.  Drainage (amount, consistency, odor) none Periwound: induration of the left thigh circumferentially  Dressing procedure/placement/frequency: Will add hydrogel for the penis to keep as moist as possible to preserve any tissue if possible. Apply daily, then wrap the penis in Vaseline gauze and use mesh panties to hold in place if possible. Continue Vaseline to the left thigh daily, no need to apply topical dressing since it falls off so easily.  Discussed case with IM MD that possible surgical evaluation of the thigh would be considered.  Pt is tearful again today and asked "am I going to die".  I have tried to explain the etiology of  calciphylaxis to the patient. He could also benefit from psychological evaluation to aid patient in dealing with complex medical issues and feelings.  New wound care orders written, calciphylaxis treatment is complex will need constant evaluation of these areas and possible follow up in wound care center for care at the time of discharge.   Discussed POC with patient and bedside nurse.  Re consult if needed, will not follow at this time. Thanks  Dorie Ohms [employer], CWOCN [phone])

## 2013-02-08 NOTE — Progress Notes (Signed)
  Date: 02/08/2013  Patient name: Adam [name]  Medical record number: [id number]  Date of birth: Feb 04, 1965   This patient has been seen and the plan of care was discussed with the house staff. Please see their note for complete details. I concur with their findings with the following additions/corrections:  Concern for calciphylaxis high, unclear if penile changes are consistent with this diagnosis, but Urology is following for possible need for further surgery.  Continue Abx and treatment for calciphylaxis with daily HD, oxygen and Na thiosulfate.  Patient may benefit from clinical psychology consult in the long term as he is very depressed about his current medical status.   Inez Catalina, MD 02/08/2013, 3:43 PM

## 2013-02-08 NOTE — Procedures (Signed)
I was present at this dialysis session, have reviewed the session itself and made  appropriate changes  Vinson Moselle MD (pgr) [phone]    ([phone] 02/08/2013, 12:17 PM

## 2013-02-08 NOTE — Progress Notes (Signed)
Patient's IV in left hand outdated and lost access during hemodialysis (luer lock came off).  Assessed patient for potential IV sites, but due to right arm being restricted, no potential sites were found and IV team was notified.  Will continue to monitor.

## 2013-02-08 NOTE — Progress Notes (Signed)
ANTICOAGULATION CONSULT NOTE - Follow Up Consult  Pharmacy Consult for Coumadin Indication: atrial fibrillation  Allergies  Allergen Reactions  . Argatroban Other (See Comments)    Ventricular tachycardia  . Ace Inhibitors     Acute renal failure with multiple trials  . Heparin     Thrombcytopenia. SRA positive. See miscellaneous test (SRA results)    Patient Measurements: Height: 6' (182.9 cm) Weight: 313 lb 0.9 oz (142 kg) IBW/kg (Calculated) : 77.6 Heparin Dosing Weight:    Vital Signs: Temp: 98 F (36.7 C) (01/13 0909) Temp src: Oral (01/13 0909) BP: 107/59 mmHg (01/13 1230) Pulse Rate: 77 (01/13 1230)  Labs:  Recent Labs  02/06/13 0513 02/07/13 0400 02/08/13 0440 02/08/13 0930  HGB 7.8* 7.7* 9.4*  --   HCT 24.5* 24.6* 29.4*  --   PLT 415* 407* 368  --   LABPROT 17.0* 21.5* 22.5*  --   INR 1.42 1.93* 2.05*  --   CREATININE 3.26* 4.54* 3.43* 3.72*    Estimated Creatinine Clearance: 35.5 ml/min (by C-G formula based on Cr of 3.72).   Assessment:  CC: penis pain/discharge and leg pain  Anticoagulation: Coumadin for Afib, INR 2.2 on admit (3.75mg  daily except 2.5mg  Mon PTA), HIT+, INR 2.05 today.  Goal of Therapy:  INR 2-3 Monitor platelets by anticoagulation protocol: Yes   Plan:  Coumadin 4mg  po x 1 tonight.     Adam [name], PharmD, BCPS Clinical Staff Pharmacist Pager [phone]  Adam [name] 02/08/2013,1:01 PM

## 2013-02-08 NOTE — Progress Notes (Signed)
4 Days Post-Op  Subjective:  1- Penile Necrosis / Wound - s/p operative debridement of penile wound and circumcision 02/04/13. CX polymicrobial.  Today Hooper is stable. His glans is numb. No fevers.  Objective: Vital signs in last 24 hours: Temp:  [97 F (36.1 C)-98.1 F (36.7 C)] 97.6 F (36.4 C) (01/13 1500) Pulse Rate:  [70-116] 103 (01/13 1500) Resp:  [18-22] 18 (01/13 1500) BP: (72-111)/(40-66) 105/65 mmHg (01/13 1500) SpO2:  [64 %-96 %] 95 % (01/13 1500) Weight:  [139.5 kg (307 lb 8.7 oz)-142 kg (313 lb 0.9 oz)] 139.5 kg (307 lb 8.7 oz) (01/13 1345) Last BM Date: 02/06/13  Intake/Output from previous day: 01/12 0701 - 01/13 0700 In: 1095 [P.O.:120; Blood:975] Out: -  Intake/Output this shift: Total I/O In: 220 [P.O.:220] Out: 2800 [Other:2800]  General appearance: alert, cooperative and family at bedside Head: Normocephalic, without obvious abnormality, atraumatic Eyes: conjunctivae/corneas clear. PERRL, EOM's intact. Fundi benign. GI: soft, non-tender; bowel sounds normal; no masses,  no organomegaly Male genitalia: complex penile wound with visible corpora cavernosa dorsally. Wound edges appear clean. Glans firm / woody and numb. No obvious continued wet necrosis.   Lab Results:   Recent Labs  02/07/13 0400 02/08/13 0440  WBC 13.1* 11.1*  HGB 7.7* 9.4*  HCT 24.6* 29.4*  PLT 407* 368   BMET  Recent Labs  02/08/13 0440 02/08/13 0930  NA 136* 136*  K 3.4* 3.4*  CL 94* 95*  CO2 27 25  GLUCOSE 95 78  BUN 18 20  CREATININE 3.43* 3.72*  CALCIUM 8.3* 8.2*   PT/INR  Recent Labs  02/07/13 0400 02/08/13 0440  LABPROT 21.5* 22.5*  INR 1.93* 2.05*   ABG No results found for this basename: PHART, PCO2, PO2, HCO3,  in the last 72 hours  Studies/Results: No results found.  Anti-infectives: Anti-infectives   Start     Dose/Rate Route Frequency Ordered Stop   02/08/13 1200  vancomycin (VANCOCIN) IVPB 1000 mg/200 mL premix  Status:  Discontinued     1,000 mg 200 mL/hr over 60 Minutes Intravenous Every T-Th-Sa (Hemodialysis) 02/06/13 1039 02/07/13 1100   02/07/13 1415  piperacillin-tazobactam (ZOSYN) IVPB 3.375 g     3.375 g 12.5 mL/hr over 240 Minutes Intravenous 3 times per day 02/07/13 1407     02/06/13 1800  vancomycin (VANCOCIN) 1,750 mg in sodium chloride 0.9 % 500 mL IVPB  Status:  Discontinued     1,750 mg 250 mL/hr over 120 Minutes Intravenous Every 24 hours 02/05/13 1746 02/06/13 1039   02/06/13 1400  piperacillin-tazobactam (ZOSYN) IVPB 2.25 g  Status:  Discontinued     2.25 g 100 mL/hr over 30 Minutes Intravenous 3 times per day 02/06/13 1039 02/07/13 1407   02/06/13 0500  piperacillin-tazobactam (ZOSYN) IVPB 3.375 g  Status:  Discontinued     3.375 g 12.5 mL/hr over 240 Minutes Intravenous 3 times per day 02/05/13 1745 02/06/13 1039   02/05/13 1830  vancomycin (VANCOCIN) 2,500 mg in sodium chloride 0.9 % 500 mL IVPB     2,500 mg 250 mL/hr over 120 Minutes Intravenous  Once 02/05/13 1744 02/05/13 2049   02/04/13 2145  ceFAZolin (ANCEF) 3 g in dextrose 5 % 50 mL IVPB  Status:  Discontinued     3 g 160 mL/hr over 30 Minutes Intravenous  Once 02/04/13 2134 02/05/13 1723   02/03/13 1200  fluconazole (DIFLUCAN) tablet 150 mg     150 mg Oral  Once 02/03/13 1123 02/03/13 2146   02/03/13 1130  amoxicillin-clavulanate (AUGMENTIN) 500-125 MG per tablet 500 mg  Status:  Discontinued     1 tablet Oral 3 times daily 02/03/13 1123 02/05/13 1723   02/03/13 0615  vancomycin (VANCOCIN) IVPB 1000 mg/200 mL premix     1,000 mg 200 mL/hr over 60 Minutes Intravenous  Once 02/03/13 0602 02/03/13 0729   02/03/13 0615  piperacillin-tazobactam (ZOSYN) IVPB 3.375 g     3.375 g 12.5 mL/hr over 240 Minutes Intravenous  Once 02/03/13 0602 02/03/13 1133      Assessment/Plan:  1- Penile Necrosis / Wound - no indications fur urgent re-operation at this point. Will follow wound.   LOS: 5 days    [facility] CenterMANNY, Asyria Kolander 02/08/2013

## 2013-02-08 NOTE — Progress Notes (Signed)
Subjective: [name] has worsening of his leg pain, states his inner thighs and lower legs are much more tender this morning. The wound to his L inner thigh has extended. He still has some pain to his penis, though this is fairly well controlled. Urology saw patient yesterday and noted they will reevaluate later this week for possible return to the OR for continued debridement, wound care consult for dressing changes until that time. Nephrology saw patient yesterday and dialyzed.   Objective: Vital signs in last 24 hours: Filed Vitals:   02/07/13 1730 02/07/13 1740 02/07/13 2041 02/08/13 0539  BP: 72/42 93/55 92/62  91/60  Pulse: 70 80 102 105  Temp:  97.9 F (36.6 C) 97.9 F (36.6 C) 98.1 F (36.7 C)  TempSrc:  Oral Oral Oral  Resp: 21 18 20 18   Height:      Weight:  309 lb 8.4 oz (140.4 kg)  307 lb 15.7 oz (139.7 kg)  SpO2:  96% 96% 95%   Weight change: 2 lb 0.1 oz (0.911 kg)  Intake/Output Summary (Last 24 hours) at 02/08/13 0848 Last data filed at 02/07/13 1715  Gross per 24 hour  Intake   1095 ml  Output      0 ml  Net   1095 ml   Physical Exam General: lying in bed, NAD- though appears to be sad about his current state of health HEENT: [place]/AT, vision grossly intact Lungs: CTAB Heart: RRR, no m/g/r Abdomen: soft, obese, NT, +bs Extremities: induration and hyperpigmentation to thighs has extended and is much more TTP than yesterday; the erosion to L inner thigh looks to be healing, though has enlarged since time of admission Neurologic: alert & oriented X3, moves all extremities spontaneously GU: dry dressing in place over penile shaft, dried up blood on gauze, no visible pus or drainage, no odor, circumscised penis mild tender to palpation, glans is firm and darker in color than yesterday  Lab Results: Basic Metabolic Panel:  Recent Labs Lab 02/05/13 0430  02/07/13 0400 02/08/13 0440  NA 135*  < > 133* 136*  K 3.6*  < > 4.1 3.4*  CL 93*  < > 92* 94*  CO2 27  <  > 25 27  GLUCOSE 117*  < > 169* 95  BUN 15  < > 27* 18  CREATININE 3.32*  < > 4.54* 3.43*  CALCIUM 8.4  < > 8.6 8.3*  MG 1.8  --   --   --   PHOS 3.6  < > 3.4 2.5  < > = values in this interval not displayed. Liver Function Tests:  Recent Labs Lab 02/03/13 0545  02/07/13 0400 02/08/13 0440  AST 9  --   --   --   ALT <5  --   --   --   ALKPHOS 188*  --   --   --   BILITOT 1.6*  --   --   --   PROT 9.1*  --   --   --   ALBUMIN 2.2*  < > 1.9* 1.8*  < > = values in this interval not displayed. CBC:  Recent Labs Lab 02/03/13 0545  02/05/13 0430  02/07/13 0400 02/08/13 0440  WBC 15.9*  < > 14.1*  < > 13.1* 11.1*  NEUTROABS 12.6*  --  11.1*  --   --   --   HGB 8.3*  < > 6.7*  < > 7.7* 9.4*  HCT 27.0*  < > 21.3*  < >  24.6* 29.4*  MCV 90.6  < > 90.3  < > 89.5 88.6  PLT 478*  < > 408*  < > 407* 368  < > = values in this interval not displayed.  Cardiac Enzymes:  Recent Labs Lab 02/03/13 0545  TROPONINI <0.30   CBG:  Recent Labs Lab 02/06/13 1708 02/06/13 2214 02/07/13 0605 02/07/13 1154 02/07/13 2052 02/08/13 0631  GLUCAP 186* 144* 154* 177* 179* 97   Coagulation:  Recent Labs Lab 02/05/13 0430 02/06/13 0513 02/07/13 0400 02/08/13 0440  LABPROT 18.9* 17.0* 21.5* 22.5*  INR 1.63* 1.42 1.93* 2.05*   Anemia Panel:  Recent Labs Lab 02/03/13 1525  FERRITIN 275  TIBC 189*  IRON 56   Urine Drug Screen: Drugs of Abuse     Component Value Date/Time   LABOPIA NEG 03/17/2006 1455   COCAINSCRNUR NEG 03/17/2006 1455   LABBENZ NEG 03/17/2006 1455   AMPHETMU NEG 03/17/2006 1455    Micro Results: Recent Results (from the past 240 hour(s))  CULTURE, BLOOD (ROUTINE X 2)     Status: None   Collection Time    02/03/13  5:45 AM      Result Value Range Status   Specimen Description BLOOD ARM LEFT   Final   Special Requests BOTTLES DRAWN AEROBIC AND ANAEROBIC 10CC   Final   Culture  Setup Time     Final   Value: 02/03/2013 13:55     Performed at [facility]   Culture     Final   Value:        BLOOD CULTURE RECEIVED NO GROWTH TO DATE CULTURE WILL BE HELD FOR 5 DAYS BEFORE ISSUING A FINAL NEGATIVE REPORT     Performed at [facility]   Report Status PENDING   Incomplete  CULTURE, BLOOD (ROUTINE X 2)     Status: None   Collection Time    02/03/13  5:52 AM      Result Value Range Status   Specimen Description BLOOD HAND LEFT   Final   Special Requests BOTTLES DRAWN AEROBIC ONLY 8CC   Final   Culture  Setup Time     Final   Value: 02/03/2013 13:54     Performed at [facility]   Culture     Final   Value:        BLOOD CULTURE RECEIVED NO GROWTH TO DATE CULTURE WILL BE HELD FOR 5 DAYS BEFORE ISSUING A FINAL NEGATIVE REPORT     Performed at [facility]   Report Status PENDING   Incomplete  MRSA PCR SCREENING     Status: Abnormal   Collection Time    02/03/13 12:07 PM      Result Value Range Status   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 It is not     intended to diagnose MRSA     infection nor to guide or     monitor treatment for     MRSA infections.     RESULT CALLED TO, READ BACK BY AND VERIFIED WITH:     A. BRAKE RN 14:30 02/03/13 ([email or web address])  WOUND CULTURE     Status: None   Collection Time  02/04/13 10:38 AM      Result Value Range Status   Specimen Description WOUND PENIS   Final   Special Requests NONE   Final   Gram Stain     Final   Value: FEW WBC PRESENT,BOTH PMN AND MONONUCLEAR     RARE SQUAMOUS EPITHELIAL CELLS PRESENT     MODERATE GRAM NEGATIVE RODS     MODERATE GRAM POSITIVE COCCI IN PAIRS     Performed at [facility]   Culture     Final   Value: MULTIPLE ORGANISMS PRESENT, NONE PREDOMINANT     Note: NO STAPHYLOCOCCUS AUREUS ISOLATED NO GROUP A STREP (S.PYOGENES) ISOLATED     Performed at  [facility]   Report Status 02/06/2013 FINAL   Final  URINE CULTURE     Status: None   Collection Time    02/06/13  6:11 AM      Result Value Range Status   Specimen Description URINE, CATHETERIZED   Final   Special Requests NONE   Final   Culture  Setup Time     Final   Value: 02/06/2013 18:02     Performed at [facility] Count     Final   Value: >=100,000 COLONIES/ML     Performed at [facility]   Culture     Final   Value: Multiple bacterial morphotypes present, none predominant. Suggest appropriate recollection if clinically indicated.     Performed at [facility]   Report Status 02/08/2013 FINAL   Final  GC/CHLAMYDIA PROBE AMP     Status: None   Collection Time    02/06/13  6:18 AM      Result Value Range Status   CT Probe RNA NEGATIVE  NEGATIVE Final   GC Probe RNA NEGATIVE  NEGATIVE Final   Comment: (NOTE)                                                                                               **Normal Reference Range: Negative**          Assay performed using the Gen-Probe APTIMA COMBO2 (R) Assay.     Acceptable specimen types for this assay include APTIMA Swabs (Unisex,     endocervical, urethral, or vaginal), first void urine, and ThinPrep     liquid based cytology samples.     Performed at [facility]   Medications: I have reviewed the patient's current medications. Scheduled Meds: . amiodarone  200 mg Oral Daily  . Chlorhexidine Gluconate Cloth  6 each Topical Q0600  . cinacalcet  30 mg Oral Q breakfast  . colchicine  0.6 mg Oral Q3 days  . darbepoetin (ARANESP) injection - DIALYSIS  200 mcg Intravenous Q Thu-HD  . diclofenac sodium  2 g Topical QID  . Febuxostat  1 tablet Oral Daily  . insulin aspart  0-9 Units Subcutaneous TID WC  . insulin glargine  45 Units Subcutaneous QHS  . LORazepam  0.5 mg Oral Daily  . midodrine  10 mg Oral BID WC  . multivitamin  1 tablet Oral  QHS  . mupirocin ointment  1  application Nasal BID  . pantoprazole  40 mg Oral QAC breakfast  . piperacillin-tazobactam (ZOSYN)  IV  3.375 g Intravenous Q8H  . sevelamer carbonate  1,600 mg Oral TID WC  . sodium chloride 0.9 % 100 mL with sodium thiosulfate 25 g infusion   Intravenous To Hemo  . traMADol  100 mg Oral BID  . 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 Patient has been afebrile over 24h and white count downtrending. 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 Wound culture negative, no staph or strep. BCx NGTD, HIV non-reactive. GC/Chlamydia negative. -continue zosyn (day 4) -pt might go back to OR per urology  Skin erosion and ulcerations to L medial leg, possible calciphylaxis: Erosion to L medial thigh appears to be healing though has enlarged since his admission. Patient has significantly worsening pain this morning to his bilateral legs. Do not believe these wounds are infected as there is no drainage. Thigh induration is new in the last week, though lower leg induration has been ongoing for years. -per vascular recommendation: leg elevation and compression, likely will benefit from [facility] boot as outpatient and changed three times a week, but wound care recommends discharge follow up at wound center and continuation of non-adherent dressings at this time.  -begin high flow O2 therapy today, venturi mask at 15LPM for 2 hours daily -sodium thiosulfate 25g IV in NS over 30-60 mins three times per week -PTH level pending - discontinue all vitamin D and calcium supplementation -consider d/c  warfarin as this can exacerbate calciphylaxis -will hold off on biopsy or surgical consultation for now given this will increase risk of superinfection and sepsis -aggressive HD as per below -blood transfusion to keep Hb 9.5-10.5 -continue sensipar and renvela  ESRD onHD (T/Thu/S): Last HD session was yesterday. Will plan for HD daily for 1 week and then 5-6 times per week after that. Efforts to decrease EDW have not been successful 2/2 hypotension. Patient remains oliguric.  -added midodrine BID for BP  UTI- UA dirty, likely gram negative infection.  -continue zosyn -UCx shows multiple bacterial morphotypes present without any predominant  A Fib: HR stable. He is on amiodarone 200 mg daily at home and Coumadin.  INR 2.05 today -coumadin per pharmacy -continue amiodarone  DM-II: A1c 4.9 on 12/13/12, which may not be accurate due to dialysis. Patient takes Lantus 45U qHS at home. CBGs 97-179. -continue Lantus 45 U qHS -SSI   CHF with EF of 25%: BW was 318 on 09/25/12-->318lb on admission, today BW is 307lbs. 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    The patient does have a current PCP Evelena Peat, DO) and does need an [facility] hospital follow-up appointment after discharge.  The patient does not have transportation limitations that hinder transportation to clinic appointments.  Services Needed at time of discharge: Y = Yes, Blank = No PT:   OT:   RN:   Equipment:  Other:     LOS: 5 days   Windell Hummingbird, MD 02/08/2013, 8:48 AM

## 2013-02-09 LAB — RENAL FUNCTION PANEL
Albumin: 1.8 g/dL — ABNORMAL LOW (ref 3.5–5.2)
BUN: 14 mg/dL (ref 6–23)
CHLORIDE: 93 meq/L — AB (ref 96–112)
CO2: 26 mEq/L (ref 19–32)
CREATININE: 3.31 mg/dL — AB (ref 0.50–1.35)
Calcium: 8.3 mg/dL — ABNORMAL LOW (ref 8.4–10.5)
GFR calc Af Amer: 24 mL/min — ABNORMAL LOW (ref >90)
GFR calc non Af Amer: 21 mL/min — ABNORMAL LOW (ref >90)
Glucose, Bld: 96 mg/dL (ref 70–99)
PHOSPHORUS: 1.5 mg/dL — AB (ref 2.3–4.6)
POTASSIUM: 3.6 meq/L — AB (ref 3.7–5.3)
Sodium: 137 mEq/L (ref 137–147)

## 2013-02-09 LAB — CULTURE, BLOOD (ROUTINE X 2)
CULTURE: NO GROWTH
Culture: NO GROWTH

## 2013-02-09 LAB — GLUCOSE, CAPILLARY
GLUCOSE-CAPILLARY: 65 mg/dL — AB (ref 70–99)
Glucose-Capillary: 104 mg/dL — ABNORMAL HIGH (ref 70–99)
Glucose-Capillary: 97 mg/dL (ref 70–99)
Glucose-Capillary: 145 mg/dL — ABNORMAL HIGH (ref 70–99)
Glucose-Capillary: 128 mg/dL — ABNORMAL HIGH (ref 70–99)

## 2013-02-09 LAB — PROTIME-INR
INR: 2.32 — ABNORMAL HIGH (ref 0.00–1.49)
Prothrombin Time: 24.7 seconds — ABNORMAL HIGH (ref 11.6–15.2)

## 2013-02-09 MED ORDER — NEPRO/CARBSTEADY PO LIQD
237.0000 mL | ORAL | Status: DC | PRN
  Filled 2013-02-09: qty 237

## 2013-02-09 MED ORDER — PENTAFLUOROPROP-TETRAFLUOROETH EX AERO: 1.0000 "application " | INHALATION_SPRAY | CUTANEOUS | Status: DC | PRN

## 2013-02-09 MED ORDER — WHITE PETROLATUM GEL
Status: DC | PRN
  Administered 2013-02-10 – 2013-02-11 (×2): 0.2 via TOPICAL
  Filled 2013-02-09: qty 5

## 2013-02-09 MED ORDER — ALTEPLASE 2 MG IJ SOLR: 2.0000 mg | Freq: Once | INTRAMUSCULAR | Status: DC | PRN

## 2013-02-09 MED ORDER — NEPRO/CARBSTEADY PO LIQD
237.0000 mL | ORAL | Status: DC | PRN
Start: 2013-02-09 — End: 2013-02-09

## 2013-02-09 MED ORDER — LIDOCAINE-PRILOCAINE 2.5-2.5 % EX CREA: 1.0000 "application " | TOPICAL_CREAM | CUTANEOUS | Status: DC | PRN

## 2013-02-09 MED ORDER — SODIUM CHLORIDE 0.9 % IV SOLN: 100.0000 mL | INTRAVENOUS | Status: DC | PRN

## 2013-02-09 MED ORDER — SODIUM THIOSULFATE 25 % IV SOLN
25.0000 g | Freq: Every day | INTRAVENOUS | Status: DC
  Administered 2013-02-09 – 2013-02-15 (×6): 25 g via INTRAVENOUS
  Filled 2013-02-09 (×13): qty 100

## 2013-02-09 MED ORDER — WARFARIN SODIUM 4 MG PO TABS
4.0000 mg | ORAL_TABLET | Freq: Once | ORAL | Status: AC
  Administered 2013-02-09: 17:00:00 4 mg via ORAL
  Filled 2013-02-09: qty 1

## 2013-02-09 MED ORDER — HEPARIN SODIUM (PORCINE) 1000 UNIT/ML DIALYSIS
1000.0000 [IU] | INTRAMUSCULAR | Status: DC | PRN
  Filled 2013-02-09: qty 1

## 2013-02-09 MED ORDER — LIDOCAINE HCL (PF) 1 % IJ SOLN: 5.0000 mL | INTRAMUSCULAR | Status: DC | PRN

## 2013-02-09 MED ORDER — HEPARIN SODIUM (PORCINE) 1000 UNIT/ML DIALYSIS: 1000.0000 [IU] | INTRAMUSCULAR | Status: DC | PRN

## 2013-02-09 MED ORDER — LIDOCAINE-PRILOCAINE 2.5-2.5 % EX CREA
1.0000 "application " | TOPICAL_CREAM | CUTANEOUS | Status: DC | PRN
  Filled 2013-02-09: qty 5

## 2013-02-09 MED ORDER — PIPERACILLIN-TAZOBACTAM IN DEX 2-0.25 GM/50ML IV SOLN
2.2500 g | Freq: Three times a day (TID) | INTRAVENOUS | Status: DC
  Administered 2013-02-09 – 2013-02-11 (×6): 2.25 g via INTRAVENOUS
  Filled 2013-02-09 (×9): qty 50

## 2013-02-09 MED ORDER — INSULIN GLARGINE 100 UNIT/ML ~~LOC~~ SOLN
35.0000 [IU] | Freq: Every day | SUBCUTANEOUS | Status: DC
  Administered 2013-02-09: 35 [IU] via SUBCUTANEOUS
  Filled 2013-02-09 (×2): qty 0.35

## 2013-02-09 MED ORDER — SODIUM CHLORIDE 0.9 % IV SOLN
100.0000 mL | INTRAVENOUS | Status: DC | PRN
Start: 2013-02-09 — End: 2013-02-09

## 2013-02-09 MED ORDER — K PHOS MONO-SOD PHOS DI & MONO 155-852-130 MG PO TABS
250.0000 mg | ORAL_TABLET | Freq: Two times a day (BID) | ORAL | Status: DC
  Administered 2013-02-09 – 2013-02-11 (×5): 250 mg via ORAL
  Filled 2013-02-09 (×6): qty 1

## 2013-02-09 MED ORDER — ALTEPLASE 2 MG IJ SOLR
2.0000 mg | Freq: Once | INTRAMUSCULAR | Status: DC | PRN
  Filled 2013-02-09: qty 2

## 2013-02-09 MED ORDER — NEPRO/CARBSTEADY PO LIQD: 237.0000 mL | ORAL | Status: DC | PRN

## 2013-02-09 MED ORDER — SODIUM THIOSULFATE 25 % IV SOLN: 25.0000 g | Freq: Every day | INTRAVENOUS | Status: DC

## 2013-02-09 NOTE — Progress Notes (Signed)
Inpatient Diabetes Program Recommendations  AACE/ADA: New Consensus Statement on Inpatient Glycemic Control (2013)  Target Ranges:  Prepandial:   less than 140 mg/dL      Peak postprandial:   less than 180 mg/dL (1-2 hours)      Critically ill patients:  140 - 180 mg/dL   Reason for Visit: Results for Adam [name], Adam [name] (MRN [id number]) as of 02/09/2013 10:31  Ref. Range 02/09/2013 06:08 02/09/2013 06:52  Glucose-Capillary Latest Range: 70-99 mg/dL 65 (L) 433 (H)   Note low CBG this morning.  Please decrease Lantus to 35 units daily.  Thanks, Beryl Meager, RN, BC-ADM Inpatient Diabetes Coordinator Pager [phone]

## 2013-02-09 NOTE — Progress Notes (Addendum)
[facility] Progress Note   Subjective: No complaints  Filed Vitals:   02/09/13 0900 02/09/13 0930 02/09/13 1000 02/09/13 1030  BP: 108/60 90/68 89/59  82/58  Pulse: 102 122 97 95  Temp:      TempSrc:      Resp:      Height:      Weight:      SpO2:      Exam Alert, no distress ++ jvd Crackles L base, R clear RRR no MRG Abd soft, obese, nt/nd, edema of lateral abd wall Marked induration with darkening of skin L  > R lower ext, serpentine skin necrosis pattern inside of L leg, both legs very tender to touch in all discolored areas. Sparing of bilat feet. Also has bilat LE edema 2+.  Penile discoloration w yellowish/ischemic appearance, scrotal changes similar to LE's Neuro is nf, ox3 R forearm avf is patent  ECHO 12/13 > EF 25%, diffuse HK, severe RV dilatation/reduced fxn  [facility] 4.75h   F180   [place] 2K/2.25Ca  DW 143.5kg   RFA AVF   No heparin    EPO 22K   Hect 6ug   No Fe at center  Assessment/Plan: 1. Penile gangrene / L thigh gangrene / diffuse bilat LE discoloration w induration/pain: clinical presentation c/w calciphylaxis; prognosis guarded. Per UTD recommendations are:  1. Daily HD x 7d then 5-6days/wk until wounds stabilize 2. PTH lowering- not an issue as PTH is already under low at 56 3. Na thiosulfate 4. Avoid Ca-containing meds and vit D preparations, use low Ca dialysate; adj Ca is 10.1, goal 8.5-9.5 5. Get phos down to 2.5-4.5 range; phos is too low now, will stop binders, give Kphos po 2-3days 6. Avoid IV Fe 7. Consider discontinuation of coumadin: this depends on risk/benefit ratio, would ask cardiologist to see regarding the reason for coumadin and risks of stopping. Recommendation if risk is high is to treat calciphylaxis with other measures and if not improving then withdraw coumadin if at all possible.  8. Use epo and transfusion to keep Hb between 9.5-10.5 9. Oxygen Rx (ordered) 10. Aggressive wound care program and pain  control 2. ESRD: extra HD as above 3. Chronic hypotension/volume: below dry wt with edema, jvd. BP's were very low, up to 90's with midodrine added. Has excess body edema, will attempt to lower dry wt gradually with daily HD 1-2 kg per HD.  4. Anemia/CKD: cont esa, no IV Fe due to #1, transfuse to keep Hb 9.5-10.5 5. NICM / bivent HF / LVEF 25% / AICD 6. Pacemaker 7. Morbid obesity  Vinson Moselle MD pager [phone]    cell [phone] 02/09/2013, 11:04 AM   Recent Labs Lab 02/08/13 0440 02/08/13 0930 02/09/13 0253  NA 136* 136* 137  K 3.4* 3.4* 3.6*  CL 94* 95* 93*  CO2 27 25 26   GLUCOSE 95 78 96  BUN 18 20 14   CREATININE 3.43* 3.72* 3.31*  CALCIUM 8.3* 8.2* 8.3*  PHOS 2.5 2.6 1.5*    Recent Labs Lab 02/03/13 0545  02/08/13 0440 02/08/13 0930 02/09/13 0253  AST 9  --   --   --   --   ALT <5  --   --   --   --   ALKPHOS 188*  --   --   --   --   BILITOT 1.6*  --   --   --   --   PROT 9.1*  --   --   --   --  ALBUMIN 2.2*  < > 1.8* 1.8* 1.8*  < > = values in this interval not displayed.  Recent Labs Lab 02/03/13 0545  02/05/13 0430  02/06/13 0513 02/07/13 0400 02/08/13 0440  WBC 15.9*  < > 14.1*  < > 16.1* 13.1* 11.1*  NEUTROABS 12.6*  --  11.1*  --   --   --   --   HGB 8.3*  < > 6.7*  < > 7.8* 7.7* 9.4*  HCT 27.0*  < > 21.3*  < > 24.5* 24.6* 29.4*  MCV 90.6  < > 90.3  < > 89.1 89.5 88.6  PLT 478*  < > 408*  < > 415* 407* 368  < > = values in this interval not displayed. Marland Kitchen. amiodarone  200 mg Oral Daily  . cinacalcet  30 mg Oral Q breakfast  . colchicine  0.6 mg Oral Q3 days  . darbepoetin (ARANESP) injection - DIALYSIS  200 mcg Intravenous Q Thu-HD  . diclofenac sodium  2 g Topical QID  . Febuxostat  1 tablet Oral Daily  . insulin aspart  0-9 Units Subcutaneous TID WC  . insulin glargine  35 Units Subcutaneous QHS  . LORazepam  0.5 mg Oral Daily  . midodrine  10 mg Oral BID WC  . multivitamin  1 tablet Oral QHS  . pantoprazole  40 mg Oral QAC breakfast   . piperacillin-tazobactam (ZOSYN)  IV  2.25 g Intravenous Q8H  . sevelamer carbonate  1,600 mg Oral TID WC  . sodium thiosulfate  25 g Intravenous Daily  . traMADol  100 mg Oral BID  . warfarin  4 mg Oral ONCE-1800  . Warfarin - Pharmacist Dosing Inpatient   Does not apply q1800     sodium chloride, sodium chloride, acetaminophen, alteplase, feeding supplement (NEPRO CARB STEADY), fluticasone, heparin, HYDROcodone-acetaminophen, lidocaine (PF), lidocaine-prilocaine, loratadine, pentafluoroprop-tetrafluoroeth

## 2013-02-09 NOTE — Progress Notes (Signed)
  Date: 02/09/2013  Patient name: Adam [name]  Medical record number: [id number]  Date of birth: 05-23-1965   This patient has been seen and the plan of care was discussed with the house staff. Please see their note for complete details. I concur with their findings with the following additions/corrections:  Treatment course started for calciphylaxis.  Lower extremities with hard induration and wounds, increased darkness today and some tenderness.  Glans of the penis with well demarcated area of necrosis at the tip, unchanged.  Will continue to monitor closely.  Good wound care ordered.   Inez Catalina, MD 02/09/2013, 11:39 AM

## 2013-02-09 NOTE — Progress Notes (Signed)
ANTICOAGULATION CONSULT NOTE - Follow Up Consult  Pharmacy Consult for Coumadin/Zosyn Indication: atrial fibrillation / Gangrene  Allergies  Allergen Reactions  . Argatroban Other (See Comments)    Ventricular tachycardia  . Ace Inhibitors     Acute renal failure with multiple trials  . Heparin     Thrombcytopenia. SRA positive. See miscellaneous test (SRA results)    Patient Measurements: Height: 6' (182.9 cm) Weight: 311 lb 1.1 oz (141.1 kg) IBW/kg (Calculated) : 77.6 Heparin Dosing Weight:    Vital Signs: Temp: 97.5 F (36.4 C) (01/14 0845) Temp src: Oral (01/14 0845) BP: 89/59 mmHg (01/14 1000) Pulse Rate: 97 (01/14 1000)  Labs:  Recent Labs  02/07/13 0400 02/08/13 0440 02/08/13 0930 02/09/13 0253  HGB 7.7* 9.4*  --   --   HCT 24.6* 29.4*  --   --   PLT 407* 368  --   --   LABPROT 21.5* 22.5*  --  24.7*  INR 1.93* 2.05*  --  2.32*  CREATININE 4.54* 3.43* 3.72* 3.31*    Estimated Creatinine Clearance: 39.8 ml/min (by C-G formula based on Cr of 3.31).   Assessment: CC: penis pain/discharge and leg pain  Anticoagulation: Coumadin for Afib, INR 2.2 on admit (3.75mg  daily except 2.5mg  Mon PTA), HIT+, INR 2.32 today.  ID: Zosyn D#5 for penile / thigh wounds >> gangrene of penile shaft s/p circumcision and debridement 1/9, Penile gangrene / L thigh gangrene / diffuse bilat LE discoloration w induration/pain: clinical presentation c/w calciphylaxis . POD #6. F/u UCx. Afebrile, WBC down 11.1  Augmentin 1/8 >> 1/10 Vanc 1/10 >> 1/12 Zosyn 1/10 >>  1/11: UCx: >100,000 multiple bacteria 1/9: Wound cx - Multiple organisms, none predominant. 1/8: Blood Cx x2 - Negative 1/8 MRSA PCR - positive  Cardiovascular: hx CHF (EF 25%), afib PPM, HTN, ICD, - hypotensive, HR 77-116 - amiodarone, midodrine started  Endocrinology: Gout on colchicine/febuxostat/allopurinol. DM2, CBGs 65-133 on SSI + Lantus 45/d  GI: GERD - renal diet, PPI PO  Neuro: scheduled Ativan,  tramadol, voltaren gel  Nephrology: ESRD on HD TTS, low Na/CL - Sensipar, Renavite, Renagel, Aranesp/Thurs. To get sodium thiosulfate daily for calciphylaxis--HD 4.5hrs daily x 7 then 5-6x/wk until wounds stabilize  Pulmonary: OSA / +tobacco -95%  Hematology / Oncology: hgb 9.4 (BL ~8 over past 5 months), plts 368 - on Aranesp/Thurs  PTA Medication Issues  Best Practices: Coumadin, CHG/mupirocin  Goal of Therapy:  INR 2-3 Monitor platelets by anticoagulation protocol: Yes  Plan:  Coumadin 4mg  po x 1 tonight. Zosyn 2.25g IV q8hrs for ESRD despite daily HD.   Demitra Danley S. Merilynn Finland, PharmD, BCPS Clinical Staff Pharmacist Pager [phone]  Misty Stanley Stillinger 02/09/2013,10:33 AM

## 2013-02-09 NOTE — Progress Notes (Signed)
Hypoglycemic Event  CBG: 65  Treatment: 15 GM carbohydrate snack  Symptoms: None  Follow-up CBG: [id number] CBG Result:104   Possible Reasons for Event:   Comments/MD notified:no    Adam [name], Adam [name]  Remember to initiate Hypoglycemia Order Set & complete

## 2013-02-09 NOTE — Progress Notes (Signed)
PT Cancellation Note  Patient Details Name: Adam [name] MRN: [id number] DOB: [birth year]   Cancelled Treatment:    Reason Eval/Treat Not Completed: Patient declined, no reason specified.  Worn out, in HD all morning. 02/09/2013  [place] Bing, PT [phone]  (pager)  Roney Youtz, Eliseo Gum 02/09/2013, 3:56 PM

## 2013-02-09 NOTE — Progress Notes (Signed)
OT Cancellation Note  Patient Details Name: Adam [name] MRN: [id number] DOB: 11/02/1965   Cancelled Treatment:    Reason Eval/Treat Not Completed: Patient at procedure or test/ unavailable. Pt at hemodialysis, will re attempt later as time allows  Galen Manila 02/09/2013, 10:15 AM

## 2013-02-09 NOTE — Progress Notes (Signed)
Subjective: [name] was seen and evaluated in HD today. He feels as though his pain is well controlled this morning. No further complaints.    Objective: Vital signs in last 24 hours: Filed Vitals:   02/09/13 0900 02/09/13 0930 02/09/13 1000 02/09/13 1030  BP: 108/60 90/68 89/59  82/58  Pulse: 102 122 97 95  Temp:      TempSrc:      Resp:      Height:      Weight:      SpO2:       Weight change: 2 lb 10.3 oz (1.2 kg)  Intake/Output Summary (Last 24 hours) at 02/09/13 1057 Last data filed at 02/09/13 0809  Gross per 24 hour  Intake   1040 ml  Output   2800 ml  Net  -1760 ml   Physical Exam General: lying in bed, NAD, better spirits today HEENT: [place]/AT, vision grossly intact Lungs: CTAB Heart: RRR, no m/g/r Abdomen: soft, obese, NT, +bs Extremities: induration and hyperpigmentation to thighs stable, the erosion to L inner thigh looks to be healing though is larger than it was on admission; there is a new area of skin breakdown on R inner thigh with some clear drainage Neurologic: alert & oriented X3, moves all extremities spontaneously GU: dry dressing in place over penile shaft, dried up blood on gauze, no visible pus or drainage, no odor, circumscised penis mild tender to palpation, glans is firm   Lab Results: Basic Metabolic Panel:  Recent Labs Lab 02/05/13 0430  02/08/13 0930 02/09/13 0253  NA 135*  < > 136* 137  K 3.6*  < > 3.4* 3.6*  CL 93*  < > 95* 93*  CO2 27  < > 25 26  GLUCOSE 117*  < > 78 96  BUN 15  < > 20 14  CREATININE 3.32*  < > 3.72* 3.31*  CALCIUM 8.4  < > 8.2* 8.3*  MG 1.8  --   --   --   PHOS 3.6  < > 2.6 1.5*  < > = values in this interval not displayed. Liver Function Tests:  Recent Labs Lab 02/03/13 0545  02/08/13 0930 02/09/13 0253  AST 9  --   --   --   ALT <5  --   --   --   ALKPHOS 188*  --   --   --   BILITOT 1.6*  --   --   --   PROT 9.1*  --   --   --   ALBUMIN 2.2*  < > 1.8* 1.8*  < > = values in this interval not  displayed. CBC:  Recent Labs Lab 02/03/13 0545  02/05/13 0430  02/07/13 0400 02/08/13 0440  WBC 15.9*  < > 14.1*  < > 13.1* 11.1*  NEUTROABS 12.6*  --  11.1*  --   --   --   HGB 8.3*  < > 6.7*  < > 7.7* 9.4*  HCT 27.0*  < > 21.3*  < > 24.6* 29.4*  MCV 90.6  < > 90.3  < > 89.5 88.6  PLT 478*  < > 408*  < > 407* 368  < > = values in this interval not displayed.  Cardiac Enzymes:  Recent Labs Lab 02/03/13 0545  TROPONINI <0.30   CBG:  Recent Labs Lab 02/08/13 0631 02/08/13 1427 02/08/13 1636 02/08/13 2142 02/09/13 0608 02/09/13 0652  GLUCAP 97 94 133* 113* 65* 104*   Coagulation:  Recent Labs  Lab 02/06/13 0513 02/07/13 0400 02/08/13 0440 02/09/13 0253  LABPROT 17.0* 21.5* 22.5* 24.7*  INR 1.42 1.93* 2.05* 2.32*   Anemia Panel:  Recent Labs Lab 02/03/13 1525  FERRITIN 275  TIBC 189*  IRON 56   Urine Drug Screen: Drugs of Abuse     Component Value Date/Time   LABOPIA NEG 03/17/2006 1455   COCAINSCRNUR NEG 03/17/2006 1455   LABBENZ NEG 03/17/2006 1455   AMPHETMU NEG 03/17/2006 1455    Micro Results: Recent Results (from the past 240 hour(s))  CULTURE, BLOOD (ROUTINE X 2)     Status: None   Collection Time    02/03/13  5:45 AM      Result Value Range Status   Specimen Description BLOOD ARM LEFT   Final   Special Requests BOTTLES DRAWN AEROBIC AND ANAEROBIC 10CC   Final   Culture  Setup Time     Final   Value: 02/03/2013 13:55     Performed at [facility]   Culture     Final   Value: NO GROWTH 5 DAYS     Performed at [facility]   Report Status 02/09/2013 FINAL   Final  CULTURE, BLOOD (ROUTINE X 2)     Status: None   Collection Time    02/03/13  5:52 AM      Result Value Range Status   Specimen Description BLOOD HAND LEFT   Final   Special Requests BOTTLES DRAWN AEROBIC ONLY 8CC   Final   Culture  Setup Time     Final   Value: 02/03/2013 13:54     Performed at [facility]   Culture     Final   Value: NO GROWTH 5  DAYS     Performed at [facility]   Report Status 02/09/2013 FINAL   Final  MRSA PCR SCREENING     Status: Abnormal   Collection Time    02/03/13 12:07 PM      Result Value Range Status   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 It is not     intended to diagnose MRSA     infection nor to guide or     monitor treatment for     MRSA infections.     RESULT CALLED TO, READ BACK BY AND VERIFIED WITH:     A. BRAKE RN 14:30 02/03/13 ([email or web address])  WOUND CULTURE     Status: None   Collection Time    02/04/13 10:38 AM      Result Value Range Status   Specimen Description WOUND PENIS   Final   Special Requests NONE   Final   Gram Stain     Final   Value: FEW WBC PRESENT,BOTH PMN AND MONONUCLEAR     RARE SQUAMOUS EPITHELIAL CELLS PRESENT     MODERATE GRAM NEGATIVE RODS     MODERATE GRAM POSITIVE COCCI IN PAIRS     Performed at [facility]   Culture     Final   Value: MULTIPLE ORGANISMS PRESENT, NONE PREDOMINANT     Note: NO STAPHYLOCOCCUS AUREUS ISOLATED NO GROUP A STREP (S.PYOGENES) ISOLATED     Performed at [facility]  Report Status 02/06/2013 FINAL   Final  URINE CULTURE     Status: None   Collection Time    02/06/13  6:11 AM      Result Value Range Status   Specimen Description URINE, CATHETERIZED   Final   Special Requests NONE   Final   Culture  Setup Time     Final   Value: 02/06/2013 18:02     Performed at [facility] Count     Final   Value: >=100,000 COLONIES/ML     Performed at [facility]   Culture     Final   Value: Multiple bacterial morphotypes present, none predominant. Suggest appropriate recollection if clinically indicated.     Performed at [facility]   Report Status 02/08/2013 FINAL   Final  GC/CHLAMYDIA PROBE AMP      Status: None   Collection Time    02/06/13  6:18 AM      Result Value Range Status   CT Probe RNA NEGATIVE  NEGATIVE Final   GC Probe RNA NEGATIVE  NEGATIVE Final   Comment: (NOTE)                                                                                               **Normal Reference Range: Negative**          Assay performed using the Gen-Probe APTIMA COMBO2 (R) Assay.     Acceptable specimen types for this assay include APTIMA Swabs (Unisex,     endocervical, urethral, or vaginal), first void urine, and ThinPrep     liquid based cytology samples.     Performed at [facility]   Medications: I have reviewed the patient's current medications. Scheduled Meds: . amiodarone  200 mg Oral Daily  . cinacalcet  30 mg Oral Q breakfast  . colchicine  0.6 mg Oral Q3 days  . darbepoetin (ARANESP) injection - DIALYSIS  200 mcg Intravenous Q Thu-HD  . diclofenac sodium  2 g Topical QID  . Febuxostat  1 tablet Oral Daily  . insulin aspart  0-9 Units Subcutaneous TID WC  . insulin glargine  35 Units Subcutaneous QHS  . LORazepam  0.5 mg Oral Daily  . midodrine  10 mg Oral BID WC  . multivitamin  1 tablet Oral QHS  . pantoprazole  40 mg Oral QAC breakfast  . piperacillin-tazobactam (ZOSYN)  IV  2.25 g Intravenous Q8H  . sevelamer carbonate  1,600 mg Oral TID WC  . sodium thiosulfate  25 g Intravenous QAC supper  . traMADol  100 mg Oral BID  . warfarin  4 mg Oral ONCE-1800  . Warfarin - Pharmacist Dosing Inpatient   Does not apply q1800   Continuous Infusions:   PRN Meds:.sodium chloride, sodium chloride, acetaminophen, alteplase, feeding supplement (NEPRO CARB STEADY), fluticasone, heparin, HYDROcodone-acetaminophen, lidocaine (PF), lidocaine-prilocaine, loratadine, pentafluoroprop-tetrafluoroeth  Assessment/Plan:  Skin erosion and ulcerations to L medial leg, possible calciphylaxis: Pain is improving. Erosion to L medial thigh appears to be healing though has enlarged  since his admission. R inner thigh with new small erosion  this AM. No s/s of infection at this point. -continue high flow O2 therapy today, venturi mask at 15LPM for 2 hours daily -sodium thiosulfate 25g IV in NS over 30-60 mins at the end of each HD session (daily for now) -PTH level 56.5 - discontinue all vitamin D and calcium supplementation -consider d/c warfarin as this can exacerbate calciphylaxis -will hold off on biopsy or surgical consultation for now given this will increase risk of superinfection and sepsis -aggressive HD- daily for now -blood transfusion to keep Hb 9.5-10.5 -continue cinacalcet and sevelamer -consider psychology consult given patient becoming depressed regarding current state of health  Necrosis of distal penile shaft skin and foreskin with possible resolving sepsis--s/p circumcision and debridement of penis 02/04/13 by urology. Continues to be afebrile. Glans is hard, but urology evaluated patient this morning and they do not plan to take patient back to the OR at this point. They will continue to follow -patient is on day 6/7 of antibiotic therapy, now only on zosyn  ESRD onHD (T/Thu/S):  Will plan for HD daily for 1 week (day 3/7) and then 5-6 times per week after that. Efforts to decrease EDW have not been successful 2/2 hypotension. -added midodrine BID with improvement of blood pressure  UTI- UA dirty, likely gram negative infection.  -continue zosyn -UCx shows multiple bacterial morphotypes present without any predominant  A Fib: HR stable. He is on amiodarone 200 mg daily and Coumadin at home.  INR therapeutic now. -coumadin per pharmacy -continue amiodarone  DM-II: A1c 4.9 on 12/13/12, which may not be accurate due to dialysis. Patient takes Lantus 45U qHS at home. CBGs fairly stable, though one reading 65 this AM. -will decrease Lantus to 35U qHS -SSI   CHF with EF of 25%: BW was 318 on 09/25/12-->318lb on admission, today BW is 311lbs. Renal  managing volume status with HD. 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    The patient does have a current PCP Evelena Peat, DO) and does need an [facility] hospital follow-up appointment after discharge.  The patient does not have transportation limitations that hinder transportation to clinic appointments.  Services Needed at time of discharge: Y = Yes, Blank = No PT:   OT:   RN:   Equipment:   Other:     LOS: 6 days   Windell Hummingbird, MD 02/09/2013, 10:56 AM

## 2013-02-09 NOTE — Progress Notes (Signed)
OT Cancellation Note  Patient Details Name: Adam [name] MRN: [id number] DOB: 01/09/1966   Cancelled Treatment:    Reason Eval/Treat Not Completed: Fatigue/lethargy limiting ability to participate. Pt states that he is worn out and requested OT return in the a.m.  Margaretmary Eddy [facility] 02/09/2013, 2:08 PM

## 2013-02-09 NOTE — Progress Notes (Signed)
Urology Progress Note  5 Days Post-Op MRSA+, Uurine c/s multi-species, Gram stain penis both gram negative and gram + bacteria.   1. 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 Wound: sutures open, on dressing changes. He had  clinical presentation c/w calciphylaxis, prognosis guarded. Aggressive dialysis mainstay of Rx, also Na thiosulfate, stopping of Ca/vit D containing meds, stopped IV iron. HD today 2. ESRD: HD 4.5hrs daily x 7 then 5-6x/wk until wounds stabilize 3. Chronic hypotension/volume: below dry wt with edema, jvd. BP's very low, lungs clear, no UF with HD today. Stopped IVF"s, added midodrine bid 4. Anemia/CKD: cont esa, no IV Fe due to #1, transfuse to keep Hb 9.5-10.5 5. NICM / bivent HF / LVEF 25% / AICD 6. Pacemaker 7. Morbid obesity 8. DM     No acute urologic events overnight.Has BP 85 this AM. Ambulation:   negative Flatus:    positive Bowel movement  negative  Pain: some relief  Objective:  Blood pressure 85/61, pulse 97, temperature 97.6 F (36.4 C), temperature source Oral, resp. rate 20, height 6' (1.829 m), weight 138.9 kg (306 lb 3.5 oz), SpO2 94.00%.  Physical Exam:  General:  No acute distress, awake Genitourinary:    Glans larger area of darkness, but not black or necrotic yet. No gangrene today. Infection Rx per IM/nephrology.  Foley:  For bladder "sweat".    I/O last 3 completed shifts: In: 900 [P.O.:700; IV Piggyback:200] Out: 2800 [Other:2800]  Recent Labs     02/07/13  0400  02/08/13  0440  HGB  7.7*  9.4*  WBC  13.1*  11.1*  PLT  407*  368    Recent Labs     02/08/13  0930  02/09/13  0253  NA  136*  137  K  3.4*  3.6*  CL  95*  93*  CO2  25  26  BUN  20  14  CREATININE  3.72*  3.31*  CALCIUM  8.2*  8.3*  GFRNONAA  18*  21*  GFRAA  21*  24*     Recent Labs   02/08/13  0440  02/09/13  0253  INR  2.05*  2.32*     Mr. Mcvicker has significant peripheral vascular disease, and his genital infection is a harbinger of future vasculopathy. Very guarded prognosis.  Urology Rx will be long term dressing changes, unless pt redevelops "wet" gangrene. Assessment/Plan:  Catheter not removed. Continue any current medications. Wound care discussed.

## 2013-02-09 NOTE — Progress Notes (Signed)
Penile dsg changed . Pt tolerated well.

## 2013-02-10 DIAGNOSIS — Z9229 Personal history of other drug therapy: Secondary | ICD-10-CM

## 2013-02-10 DIAGNOSIS — B349 Viral infection, unspecified: Secondary | ICD-10-CM

## 2013-02-10 LAB — GLUCOSE, CAPILLARY
GLUCOSE-CAPILLARY: 135 mg/dL — AB (ref 70–99)
GLUCOSE-CAPILLARY: 121 mg/dL — AB (ref 70–99)
Glucose-Capillary: 69 mg/dL — ABNORMAL LOW (ref 70–99)
Glucose-Capillary: 95 mg/dL (ref 70–99)
Glucose-Capillary: 107 mg/dL — ABNORMAL HIGH (ref 70–99)

## 2013-02-10 LAB — PROTIME-INR
INR: 2.8 — AB (ref 0.00–1.49)
Prothrombin Time: 28.5 seconds — ABNORMAL HIGH (ref 11.6–15.2)

## 2013-02-10 LAB — RENAL FUNCTION PANEL
Albumin: 1.8 g/dL — ABNORMAL LOW (ref 3.5–5.2)
BUN: 14 mg/dL (ref 6–23)
CO2: 22 meq/L (ref 19–32)
Calcium: 8.4 mg/dL (ref 8.4–10.5)
Chloride: 94 meq/L — ABNORMAL LOW (ref 96–112)
Creatinine, Ser: 3.13 mg/dL — ABNORMAL HIGH (ref 0.50–1.35)
GFR calc Af Amer: 25 mL/min — ABNORMAL LOW
GFR calc non Af Amer: 22 mL/min — ABNORMAL LOW
Glucose, Bld: 63 mg/dL — ABNORMAL LOW (ref 70–99)
Phosphorus: 1.6 mg/dL — ABNORMAL LOW (ref 2.3–4.6)
Potassium: 4.4 meq/L (ref 3.7–5.3)
Sodium: 140 meq/L (ref 137–147)

## 2013-02-10 MED ORDER — SODIUM CHLORIDE 0.9 % IV SOLN
100.0000 mL | INTRAVENOUS | Status: DC | PRN
Start: 2013-02-10 — End: 2013-02-10

## 2013-02-10 MED ORDER — PENTAFLUOROPROP-TETRAFLUOROETH EX AERO
1.0000 | INHALATION_SPRAY | CUTANEOUS | Status: DC | PRN
Start: 2013-02-10 — End: 2013-02-10

## 2013-02-10 MED ORDER — SODIUM CHLORIDE 0.9 % IV SOLN: 100.0000 mL | INTRAVENOUS | Status: DC | PRN

## 2013-02-10 MED ORDER — NEPRO/CARBSTEADY PO LIQD
237.0000 mL | ORAL | Status: DC | PRN
  Filled 2013-02-10: qty 237

## 2013-02-10 MED ORDER — NEPRO/CARBSTEADY PO LIQD
237.0000 mL | ORAL | Status: DC | PRN
Start: 2013-02-10 — End: 2013-02-10
  Filled 2013-02-10: qty 237

## 2013-02-10 MED ORDER — LIDOCAINE HCL (PF) 1 % IJ SOLN
5.0000 mL | INTRAMUSCULAR | Status: DC | PRN
Start: 2013-02-10 — End: 2013-02-10

## 2013-02-10 MED ORDER — HEPARIN SODIUM (PORCINE) 1000 UNIT/ML DIALYSIS
1000.0000 [IU] | INTRAMUSCULAR | Status: DC | PRN
Start: 2013-02-10 — End: 2013-02-10

## 2013-02-10 MED ORDER — ALTEPLASE 2 MG IJ SOLR
2.0000 mg | Freq: Once | INTRAMUSCULAR | Status: DC | PRN
  Filled 2013-02-10: qty 2

## 2013-02-10 MED ORDER — INSULIN GLARGINE 100 UNIT/ML ~~LOC~~ SOLN
30.0000 [IU] | Freq: Every day | SUBCUTANEOUS | Status: DC
  Administered 2013-02-10: 30 [IU] via SUBCUTANEOUS
  Filled 2013-02-10 (×2): qty 0.3

## 2013-02-10 MED ORDER — LIDOCAINE-PRILOCAINE 2.5-2.5 % EX CREA: 1.0000 "application " | TOPICAL_CREAM | CUTANEOUS | Status: DC | PRN

## 2013-02-10 MED ORDER — LIDOCAINE-PRILOCAINE 2.5-2.5 % EX CREA
1.0000 | TOPICAL_CREAM | CUTANEOUS | Status: DC | PRN
Start: 2013-02-10 — End: 2013-02-10

## 2013-02-10 MED ORDER — WARFARIN SODIUM 2.5 MG PO TABS
2.5000 mg | ORAL_TABLET | Freq: Once | ORAL | Status: AC
  Administered 2013-02-10: 19:00:00 2.5 mg via ORAL
  Filled 2013-02-10 (×2): qty 1

## 2013-02-10 MED ORDER — DARBEPOETIN ALFA-POLYSORBATE 200 MCG/0.4ML IJ SOLN
INTRAMUSCULAR | Status: AC
  Filled 2013-02-10: qty 0.4

## 2013-02-10 MED ORDER — HEPARIN SODIUM (PORCINE) 1000 UNIT/ML DIALYSIS: 1000.0000 [IU] | INTRAMUSCULAR | Status: DC | PRN

## 2013-02-10 MED ORDER — LIDOCAINE HCL (PF) 1 % IJ SOLN: 5.0000 mL | INTRAMUSCULAR | Status: DC | PRN

## 2013-02-10 NOTE — Progress Notes (Signed)
Pt had 8 beat VT in HD, md aware, Per MD Pt to be on vent mask 2hr daily. Per MD remove foley

## 2013-02-10 NOTE — Progress Notes (Signed)
OT Cancellation Note  Patient Details Name: Adam [name] MRN: [id number] DOB: October 25, 1965   Cancelled Treatment:    Reason Eval/Treat Not Completed: Fatigue/lethargy limiting ability to participate. Pt recently back from dialysis and getting ready to eat lunch as well. Agreeable for OT to check back later this afternoon  Galen Manila 02/10/2013, 2:21 PM

## 2013-02-10 NOTE — Progress Notes (Signed)
  Date: 02/10/2013  Patient name: Adam [name]  Medical record number: [id number]  Date of birth: 04-04-1965   This patient has been seen and the plan of care was discussed with the house staff. Please see their note for complete details. I concur with their findings with the following additions/corrections:  Patient seen in Dialysis.  Cardiology note reviewed, will continue coumadin for now while other treatments for calciphylaxis ongoing.  Foley to be d/c'd.   Inez Catalina, MD 02/10/2013, 1:43 PM

## 2013-02-10 NOTE — Evaluation (Signed)
Physical Therapy Evaluation Patient Details Name: Adam [name] J Stillings MRN: [id number] DOB: 22-Oct-1965 Today's Date: 02/10/2013 Time: 1420-1500 PT Time Calculation (min): 40 min  PT Assessment / Plan / Recommendation History of Present Illness  Pt with ESRD and CHF.  Clinical Impression  Pt admitted with above. Pt currently with functional limitations due to the deficits listed below (see PT Problem List). Will benefit from Rehab if they take dialysis patients.  If not will need short term skilled NH with therapy to get stronger prior to d/c home.   Pt will benefit from skilled PT to increase their independence and safety with mobility to allow discharge to the venue listed below.     PT Assessment  Patient needs continued PT services    Follow Up Recommendations  CIR;Supervision/Assistance - 24 hour                Equipment Recommendations  Wheelchair (measurements PT);Wheelchair cushion (measurements PT);Rolling walker with 5" wheels    Recommendations for Other Services Rehab consult   Frequency Min 3X/week    Precautions / Restrictions Precautions Precautions: Fall Restrictions Weight Bearing Restrictions: No   Pertinent Vitals/Pain VSS, some leg pain bil      Mobility  Bed Mobility Overal bed mobility: Needs Assistance;+2 for physical assistance Bed Mobility: Supine to Sit;Sit to Supine Supine to sit: Mod assist;+2 for physical assistance Sit to supine: +2 for physical assistance;Mod assist General bed mobility comments: Needed assist and incr time for LEs and for elevation of trunk.  Pt reaching for PT to assist with UEs to pull up as well as used pad to scoot hips to EOB.  Could have used +2 assist as this was a difficult transition to EOB.  OT joined pt and PT for transition back to bed.      Exercises General Exercises - Lower Extremity Ankle Circles/Pumps: AROM;Both;10 reps;Supine Quad Sets: AROM;Both;10 reps;Supine Long Arc Quad: AROM;Both;10 reps;Supine Hip  ABduction/ADduction: AROM;Both;10 reps;Supine   PT Diagnosis: Generalized weakness;Acute pain  PT Problem List: Decreased activity tolerance;Decreased balance;Decreased mobility;Decreased knowledge of use of DME;Decreased safety awareness;Decreased knowledge of precautions;Pain PT Treatment Interventions: DME instruction;Gait training;Functional mobility training;Therapeutic activities;Therapeutic exercise;Balance training;Patient/family education     PT Goals(Current goals can be found in the care plan section) Acute Rehab PT Goals Patient Stated Goal: To go home PT Goal Formulation: With patient Time For Goal Achievement: 02/17/13 Potential to Achieve Goals: Good  Visit Information  Last PT Received On: 02/10/13 Assistance Needed: +2 PT/OT/SLP Co-Evaluation/Treatment: Yes Reason for Co-Treatment: Complexity of the patient's impairments (multi-system involvement) PT goals addressed during session: Mobility/safety with mobility Reason Eval/Treat Not Completed: Patient at procedure or test/unavailable History of Present Illness: Pt with ESRD and CHF.       Prior Functioning  Home Living Family/patient expects to be discharged to:: Private residence Living Arrangements: Spouse/significant other Available Help at Discharge: Family;Friend(s);Available 24 hours/day Type of Home: Apartment Home Access: Level entry Home Layout: One level Home Equipment: Walker - 2 wheels;Cane - single point;Bedside commode;Shower seat Additional Comments: Pt has old walker, needs new walker and wheelchair Prior Function Level of Independence: Independent with assistive device(s) Comments: used cane PTA; Had dialysis T,TH, Sat Communication Communication: No difficulties Dominant Hand: Right    Cognition  Cognition Arousal/Alertness: Awake/alert Behavior During Therapy: WFL for tasks assessed/performed Overall Cognitive Status: Within Functional Limits for tasks assessed    Extremity/Trunk  Assessment Upper Extremity Assessment Upper Extremity Assessment: Defer to OT evaluation Lower Extremity Assessment Lower Extremity Assessment: RLE deficits/detail;LLE  deficits/detail RLE Deficits / Details: grossly3-/5 LLE Deficits / Details: grossly 3-/5 Cervical / Trunk Assessment Cervical / Trunk Assessment: Normal   Balance Balance Overall balance assessment: Needs assistance;History of Falls Sitting-balance support: No upper extremity supported;Feet supported Sitting balance-Leahy Scale: Fair Sitting balance - Comments: Pt cannot tolerate challenges to balance.  Took incr time to get pt where he could balance on EOB secondary to groin and left LE pain.   Postural control: Posterior lean Standing balance comment: Pt refused to stand or get OOB today.  States he will do so tomorrow.  General Comments General comments (skin integrity, edema, etc.): Edema noted in bil LEs  End of Session PT - End of Session Equipment Utilized During Treatment: Gait belt Activity Tolerance: Patient limited by fatigue Patient left: in bed;with call bell/phone within reach;with family/visitor present Nurse Communication: Mobility status;Need for lift equipment       INGOLD,Pakou Rainbow 02/10/2013, 3:15 PM [facility] Acute Rehabilitation [phone] (pager)

## 2013-02-10 NOTE — Procedures (Signed)
I was present at this dialysis session, have reviewed the session itself and made  appropriate changes  Vinson Moselle MD (pgr) [phone]    [phone] 02/10/2013, 8:48 AM

## 2013-02-10 NOTE — Progress Notes (Signed)
PT note: Pt off of the floor at dialysis.  Will check back as schedule permits to complete eval. Clydie Braun L. Katrinka Blazing, [place] Pager [phone] 02/10/2013

## 2013-02-10 NOTE — Progress Notes (Signed)
Inpatient Diabetes Program Recommendations  AACE/ADA: New Consensus Statement on Inpatient Glycemic Control (2013)  Target Ranges:  Prepandial:   less than 140 mg/dL      Peak postprandial:   less than 180 mg/dL (1-2 hours)      Critically ill patients:  140 - 180 mg/dL   Inpatient Diabetes Program Recommendations Insulin - Basal: Decrease Lantus to 30 units Thank you  Piedad Climes BSN, RN,CDE Inpatient Diabetes Coordinator [phone] (team pager)

## 2013-02-10 NOTE — Progress Notes (Signed)
Vent mask applied per order

## 2013-02-10 NOTE — Progress Notes (Signed)
ANTICOAGULATION CONSULT NOTE - Follow Up Consult  Pharmacy Consult for Coumadin/Zosyn Indication: atrial fibrillation / Gangrene  Allergies  Allergen Reactions  . Argatroban Other (See Comments)    Ventricular tachycardia  . Ace Inhibitors     Acute renal failure with multiple trials  . Heparin     Thrombcytopenia. SRA positive. See miscellaneous test (SRA results)    Patient Measurements: Height: 6' (182.9 cm) Weight: 313 lb 0.9 oz (142 kg) IBW/kg (Calculated) : 77.6 Heparin Dosing Weight:    Vital Signs: Temp: 98.8 F (37.1 C) (01/15 0821) Temp src: Oral (01/15 0821) BP: 98/61 mmHg (01/15 1000) Pulse Rate: 94 (01/15 1000)  Labs:  Recent Labs  02/08/13 0440 02/08/13 0930 02/09/13 0253 02/10/13 0510  HGB 9.4*  --   --   --   HCT 29.4*  --   --   --   PLT 368  --   --   --   LABPROT 22.5*  --  24.7* 28.5*  INR 2.05*  --  2.32* 2.80*  CREATININE 3.43* 3.72* 3.31* 3.13*    Estimated Creatinine Clearance: 42.2 ml/min (by C-G formula based on Cr of 3.13).   Assessment: Admit CC: penis pain/discharge and leg pain  Anticoagulation: Coumadin for Afib, INR 2.2 on admit (3.75mg  daily except 2.5mg  Mon PTA), HIT+, INR 2.8 today.  ID: Zosyn D#6 for penile / thigh wounds >> gangrene of penile shaft s/p circumcision and debridement 1/9, Penile gangrene / L thigh gangrene / diffuse bilat LE discoloration w induration/pain: clinical presentation c/w calciphylaxis . POD #7. Afebrile, WBC down 11.1  Augmentin 1/8 >> 1/10 Vanc 1/10 >> 1/12 Zosyn 1/10 >>  1/11: UCx: >100,000 multiple bacteria 1/9: Wound cx - Multiple organisms, none predominant. 1/8: Blood Cx x2 - Negative 1/8 MRSA PCR - positive  Cardiovascular: hx CHF (EF 25%), afib, PPM, HTN, ICD, - hypotensive, HR 91-129- po amiodarone, midodrine started  Endocrinology: Gout on colchicine/febuxostat/allopurinol. DM2, CBGs 65-145 (in the 60's the last 2 mornings) on SSI + Lantus 35/d (decreased 1/14)  GI: GERD - renal  diet, PPI PO  Neuro: scheduled Ativan, tramadol, voltaren gel  Nephrology: ESRD on HD TTS PTA, Meds: Sensipar, Renavite, Renagel, Aranesp/Thurs. To get sodium thiosulfate daily for calciphylaxis--HD 4.5hrs daily x 7 then 5-6x/wk until wounds stabilize  Pulmonary: OSA / +tobacco -98%  Hematology / Oncology: hgb 9.4 (BL ~8 over past 5 months), plts 368 - on Aranesp/Thurs  Best Practices: Coumadin, po PPI  Goal of Therapy:  INR 2-3 Monitor platelets by anticoagulation protocol: Yes  Plan:  Coumadin 2.5mg  po x 1 tonight. May try to transition back to home dosing. Zosyn 2.25g IV q8hrs for ESRD despite daily HD--f/u d/c today F/u Aranesp charting today.   Lennon Boutwell S. Merilynn Finland, PharmD, BCPS Clinical Staff Pharmacist Pager [phone]  Misty Stanley Stillinger 02/10/2013,10:37 AM

## 2013-02-10 NOTE — Evaluation (Signed)
Occupational Therapy Evaluation Patient Details Name: Adam [name] MRN: [id number] DOB: Jun 08, 1965 Today's Date: 02/10/2013 Time: 4696-2952 OT Time Calculation (min): 20 min  OT Assessment / Plan / Recommendation History of present illness Pt with ESRD and CHF.   Clinical Impression   Pt demos decline in function with ADLs and ADL mobility safety and would benefit from acute OT services to address impairments to increase level of function and safety. Eval limited by L LE 8.5/10 reports of pain. Pt refused sit - stand/transfers stating that he will try tomorrow    OT Assessment  Patient needs continued OT Services    Follow Up Recommendations  CIR;Supervision/Assistance - 24 hour    Barriers to Discharge Decreased caregiver support Uncertain of pt's wife can provide current level of care, Pt total A with LB ADLs and requires +2 assist for mobility at this time  Equipment Recommendations  None recommended by OT;Other (comment) (TBD at next venue of care)    Recommendations for Other Services    Frequency  Min 2X/week    Precautions / Restrictions Precautions Precautions: Fall Restrictions Weight Bearing Restrictions: No   Pertinent Vitals/Pain 8.5/10 L LE pain    ADL  Grooming: Performed;Wash/dry hands;Wash/dry face;Supervision/safety;Set up Where Assessed - Grooming: Unsupported sitting Upper Body Bathing: Simulated;Supervision/safety;Set up Where Assessed - Upper Body Bathing: Unsupported sitting Lower Body Bathing: Simulated;Maximal assistance Upper Body Dressing: Performed;Supervision/safety;Set up Where Assessed - Upper Body Dressing: Unsupported sitting Lower Body Dressing: +1 Total assistance Transfers/Ambulation Related to ADLs: Pt declined sit - stand at EOB and transfer to recliner due to L LE pain 8.5/10    OT Diagnosis: Generalized weakness;Acute pain  OT Problem List: Decreased strength;Decreased knowledge of use of DME or AE;Decreased activity  tolerance;Pain;Impaired balance (sitting and/or standing) OT Treatment Interventions: Self-care/ADL training;Therapeutic exercise;Patient/family education;Neuromuscular education;Balance training;Therapeutic activities;DME and/or AE instruction   OT Goals(Current goals can be found in the care plan section) Acute Rehab OT Goals Patient Stated Goal: To go home ADL Goals Pt Will Perform Lower Body Bathing: with mod assist;with adaptive equipment;sitting/lateral leans;sit to/from stand Pt Will Perform Lower Body Dressing: with max assist;with mod assist;with adaptive equipment;sitting/lateral leans Pt Will Transfer to Toilet: with total assist;with max assist;bedside commode Pt Will Perform Toileting - Clothing Manipulation and hygiene: with max assist;with mod assist;sitting/lateral leans;sit to/from stand Additional ADL Goal #1: Pt will complete bed mobility with max - mod A +1 to sit EOB in prep for ADLs  Visit Information  Last OT Received On: 02/10/13 Assistance Needed: +2 PT/OT/SLP Co-Evaluation/Treatment: Yes Reason for Co-Treatment: Complexity of the patient's impairments (multi-system involvement);For patient/therapist safety PT goals addressed during session: Mobility/safety with mobility OT goals addressed during session: ADL's and self-care Reason Eval/Treat Not Completed: Fatigue/lethargy limiting ability to participate History of Present Illness: Pt with ESRD and CHF.       Prior Functioning     Home Living Family/patient expects to be discharged to:: Private residence Living Arrangements: Spouse/significant other Available Help at Discharge: Family;Friend(s);Available 24 hours/day Type of Home: Apartment Home Access: Level entry Home Layout: One level Home Equipment: Walker - 2 wheels;Cane - single point;Bedside commode;Shower seat Additional Comments: Pt has old walker, needs new walker and wheelchair Prior Function Level of Independence: Independent with assistive  device(s) Comments: used cane PTA; Had dialysis T,TH, Sat Communication Communication: No difficulties Dominant Hand: Right         Vision/Perception Vision - History Baseline Vision: Wears glasses only for reading Patient Visual Report: No change from baseline Perception Perception:  Within Functional Limits   Cognition  Cognition Arousal/Alertness: Awake/alert Behavior During Therapy: WFL for tasks assessed/performed Overall Cognitive Status: Within Functional Limits for tasks assessed    Extremity/Trunk Assessment Upper Extremity Assessment Upper Extremity Assessment: Overall WFL for tasks assessed;Generalized weakness;RUE deficits/detail RUE Deficits / Details: R shoulder 3/5, pt states that he has been laying on that shoulder in bed and it feels weak and full ROM more difficult Lower Extremity Assessment Lower Extremity Assessment: Defer to PT evaluation RLE Deficits / Details: grossly3-/5 LLE Deficits / Details: grossly 3-/5 Cervical / Trunk Assessment Cervical / Trunk Assessment: Normal     Mobility Bed Mobility Overal bed mobility: Needs Assistance;+2 for physical assistance Bed Mobility: Supine to Sit;Sit to Supine Supine to sit: Mod assist;+2 for physical assistance Sit to supine: +2 for physical assistance;Mod assist General bed mobility comments: pt seated EOB upon entering room. Per PT, pt mod A +2 Transfers General transfer comment: Pt declined sit - stand at EOB and transfer to recliner due to L LE pain 8.5/10        Balance Balance Overall balance assessment: Needs assistance;History of Falls Sitting-balance support: No upper extremity supported;Feet supported Sitting balance-Leahy Scale: Fair Sitting balance - Comments: Pt cannot tolerate challenges to balance.  Took incr time to get pt where he could balance on EOB secondary to groin and left LE pain.   Postural control: Posterior lean Standing balance comment: Pt refused to stand or get OOB today.   States he will do so tomorrow.  General Comments General comments (skin integrity, edema, etc.): Edema noted in bil LEs   End of Session OT - End of Session Activity Tolerance: Patient limited by fatigue;Patient limited by pain Patient left: in bed;with call bell/phone within reach;with family/visitor present  GO     Galen ManilaSpencer, Maryum Batterson Jeanette 02/10/2013, 3:27 PM

## 2013-02-10 NOTE — Progress Notes (Addendum)
[facility] Progress Note   Subjective: No complaints  Filed Vitals:   02/09/13 1249 02/09/13 2150 02/10/13 0527 02/10/13 0821  BP: 86/59 89/55 94/67  97/63  Pulse: 97 98 106 104  Temp: 97.8 F (36.6 C) 98 F (36.7 C) 98.5 F (36.9 C) 98.8 F (37.1 C)  TempSrc: Oral Oral Oral   Resp: 27 18 18    Height:      Weight: 140.2 kg (309 lb 1.4 oz)  141.7 kg (312 lb 6.3 oz)   SpO2: 94% 98% 97%   Exam Alert, no distress, on HD ++ jvd Clear bilat RRR no MRG Abd soft, obese, nt/nd, +mild/moderate edema of lateral abd wall bilat Marked induration with darkening of skin L  > R lower ext, serpentine skin necrosis pattern inside of L leg, both legs very tender to touch in discolored areas. Sparing of bilat feet. 1+ bilat LE edema Penile discoloration w yellowish/ischemic appearance, scrotal changes similar to LE's Neuro is nf, ox3 R forearm avf is patent  ECHO 12/13 > EF 25%, diffuse HK, severe RV dilatation/reduced fxn  Dialysis: [facility] 4.75h   F180   Bath 2K/2.25Ca  DW 143.5kg   RFA AVF   No heparin    EPO 22K   Hect 6ug   No Fe at center  Assessment/Plan: 1. Penile gangrene / L thigh gangrene / diffuse bilat LE discoloration w induration/pain: clinical presentation c/w calciphylaxis; prognosis guarded. Per UTD recommendations are:  1. Daily HD x 7d then 5-6days/wk until wounds stabilize 2. PTH lowering- not an issue as PTH is already under low at 56 3. Na thiosulfate 4. Avoid Ca-containing meds and vit D preparations, use low Ca dialysate; adj Ca is 10.1, goal 8.5-9.5 5. Get phos to 2.5-4.5 range; phos is too low now, holding binders, giving Kphos, will keep K up as well so we can use low Ca bath 6. Avoid IV Fe 7. Consider discontinuation of coumadin: this depends on risk/benefit ratio, would ask cardiologist to see regarding the reason for coumadin and risks of stopping. Recommendation if risk is high is to treat calciphylaxis with other measures and if not improving  then withdraw coumadin if at all possible.  8. Use epo and transfusion to keep Hb between 9.5-10.5 9. Oxygen Rx (ordered) 10. Aggressive wound care program and pain control With this program in a case series, 6 of 7 patients with severe calciphylaxis recovered. 2. ESRD: extra HD as above 3. Chronic hypotension/volume: below dry wt, moderate tissue edema, cardiomyopathy. Attempting to remove volume gradually as tolerated with UF 1-1.5kg each HD for now. 4. Anemia/CKD: cont esa, no IV Fe due to #1, transfuse to keep Hb 9.5-10.5 5. NICM / bivent HF / LVEF 25% / AICD 6. Pacemaker 7. Morbid obesity  Vinson Moselle MD pager [phone]    cell [phone] 02/10/2013, 8:43 AM   Recent Labs Lab 02/08/13 0930 02/09/13 0253 02/10/13 0510  NA 136* 137 140  K 3.4* 3.6* 4.4  CL 95* 93* 94*  CO2 25 26 22   GLUCOSE 78 96 63*  BUN 20 14 14   CREATININE 3.72* 3.31* 3.13*  CALCIUM 8.2* 8.3* 8.4  PHOS 2.6 1.5* 1.6*    Recent Labs Lab 02/08/13 0930 02/09/13 0253 02/10/13 0510  ALBUMIN 1.8* 1.8* 1.8*    Recent Labs Lab 02/05/13 0430  02/06/13 0513 02/07/13 0400 02/08/13 0440  WBC 14.1*  < > 16.1* 13.1* 11.1*  NEUTROABS 11.1*  --   --   --   --  HGB 6.7*  < > 7.8* 7.7* 9.4*  HCT 21.3*  < > 24.5* 24.6* 29.4*  MCV 90.3  < > 89.1 89.5 88.6  PLT 408*  < > 415* 407* 368  < > = values in this interval not displayed. Marland Kitchen. amiodarone  200 mg Oral Daily  . colchicine  0.6 mg Oral Q3 days  . darbepoetin (ARANESP) injection - DIALYSIS  200 mcg Intravenous Q Thu-HD  . diclofenac sodium  2 g Topical QID  . Febuxostat  1 tablet Oral Daily  . insulin aspart  0-9 Units Subcutaneous TID WC  . insulin glargine  35 Units Subcutaneous QHS  . LORazepam  0.5 mg Oral Daily  . midodrine  10 mg Oral BID WC  . multivitamin  1 tablet Oral QHS  . pantoprazole  40 mg Oral QAC breakfast  . phosphorus  250 mg Oral BID  . piperacillin-tazobactam (ZOSYN)  IV  2.25 g Intravenous Q8H  . sodium chloride 0.9 % 100 mL  with sodium thiosulfate 25 g  25 g Intravenous Q1200  . traMADol  100 mg Oral BID  . Warfarin - Pharmacist Dosing Inpatient   Does not apply q1800     sodium chloride, sodium chloride, acetaminophen, alteplase, feeding supplement (NEPRO CARB STEADY), fluticasone, heparin, HYDROcodone-acetaminophen, lidocaine (PF), lidocaine-prilocaine, loratadine, pentafluoroprop-tetrafluoroeth, white petrolatum

## 2013-02-10 NOTE — Progress Notes (Signed)
REd PT from HD. Pt o4x, no complaints of sob. Foley remove per order 10cc return. Pt state pain in area PRN given. Will continue to monitor

## 2013-02-10 NOTE — Consult Note (Signed)
CARDIOLOGY CONSULT NOTE   Patient ID: Adam [name] MRN: [id number] DOB/AGE: 1965/12/04 48 y.o.  Admit Date: 02/03/2013 Primary Physician: Evelena Peat, DO Primary Cardiologist  Tenny Craw  /  Ladona Ridgel (EP)   Clinical Summary Mr. Sestito is a 47 y.o.male . His cardiac status is followed by Dr. Tenny Craw. His electrophysiology status is followed by Dr. Ladona Ridgel. Patient has known nonischemic cardiomyopathy. He has end-stage renal disease on dialysis. He has chronic systolic congestive heart failure. This is managed with dialysis. He does have diabetes. He has not had a stroke. He does not have hypertension at this time. However, he did have hypertension in the past before he went on dialysis. His catheterization did not show significant coronary disease.  Earlier this year the patient had significant arrhythmias. This included both atrial fibrillation and some ventricular tachycardia. The patient is being treated with amiodarone.  He is here with significant skin infection. He also has calciphylaxis. This process leads to significant skin difficulties. It is thought that Coumadin potentially plays a negative role in patients who have this problem. Cardiology is consulted to help with the ongoing thinking about the need for Coumadin.   Allergies  Allergen Reactions  . Argatroban Other (See Comments)    Ventricular tachycardia  . Ace Inhibitors     Acute renal failure with multiple trials  . Heparin     Thrombcytopenia. SRA positive. See miscellaneous test (SRA results)    Medications Scheduled Medications: . amiodarone  200 mg Oral Daily  . colchicine  0.6 mg Oral Q3 days  . darbepoetin      . darbepoetin (ARANESP) injection - DIALYSIS  200 mcg Intravenous Q Thu-HD  . diclofenac sodium  2 g Topical QID  . Febuxostat  1 tablet Oral Daily  . insulin aspart  0-9 Units Subcutaneous TID WC  . insulin glargine  35 Units Subcutaneous QHS  . LORazepam  0.5 mg Oral Daily  . midodrine  10 mg Oral BID  WC  . multivitamin  1 tablet Oral QHS  . pantoprazole  40 mg Oral QAC breakfast  . phosphorus  250 mg Oral BID  . piperacillin-tazobactam (ZOSYN)  IV  2.25 g Intravenous Q8H  . sodium chloride 0.9 % 100 mL with sodium thiosulfate 25 g  25 g Intravenous Q1200  . traMADol  100 mg Oral BID  . warfarin  2.5 mg Oral ONCE-1800  . Warfarin - Pharmacist Dosing Inpatient   Does not apply q1800     Infusions:     PRN Medications:  sodium chloride, sodium chloride, sodium chloride, sodium chloride, acetaminophen, alteplase, alteplase, feeding supplement (NEPRO CARB STEADY), feeding supplement (NEPRO CARB STEADY), fluticasone, heparin, HYDROcodone-acetaminophen, lidocaine (PF), lidocaine (PF), lidocaine-prilocaine, lidocaine-prilocaine, loratadine, pentafluoroprop-tetrafluoroeth, pentafluoroprop-tetrafluoroeth, white petrolatum   Past Medical History  Diagnosis Date  . Other and unspecified hyperlipidemia   . Insomnia, unspecified   . Obstructive sleep apnea (adult) (pediatric)   . Allergic rhinitis, cause unspecified   . Nonischemic cardiomyopathy     s/p St. Jude ICD  . Unspecified essential hypertension   . Type II or unspecified type diabetes mellitus without mention of complication, not stated as uncontrolled   . CKD (chronic kidney disease) stage 3, GFR 30-59 ml/min   . Esophageal reflux   . Morbid obesity   . Dysuria   . Tobacco use disorder   . Dysrhythmia   . Pacemaker   . Antral ulcer   . Renal azotemia   . Arthritis  Gout w/hyperuricemia  . Morbid obesity   . Shortness of breath   . Automatic implantable cardioverter-defibrillator in situ     Past Surgical History  Procedure Laterality Date  . Cardiac defibrillator placement  2011    St. Jude  . Esophagogastroduodenoscopy  01/03/2011    Procedure: ESOPHAGOGASTRODUODENOSCOPY (EGD);  Surgeon: Vertell Novak., MD;  Location: [facility] ENDOSCOPY;  Service: Endoscopy;  Laterality: N/A;  . Av fistula placement Right  08/31/2012    Procedure: ARTERIOVENOUS (AV) FISTULA CREATION;  Surgeon: Chuck Hint, MD;  Location: [facility] OR;  Service: Vascular;  Laterality: Right;  . Insertion of dialysis catheter Right 08/31/2012    Procedure: INSERTION OF DIALYSIS CATHETER-Right Internal Jugular Placement;  Surgeon: Chuck Hint, MD;  Location: [facility] OR;  Service: Vascular;  Laterality: Right;  . Circumcision N/A 02/04/2013    Procedure: CIRCUMCISION ADULT;  Surgeon: Kathi Ludwig, MD;  Location: [facility] OR;  Service: Urology;  Laterality: N/A;  . Irrigation and debridement abscess N/A 02/04/2013    Procedure: PENILE DEBRIDEMENT ;  Surgeon: Kathi Ludwig, MD;  Location: [facility] OR;  Service: Urology;  Laterality: N/A;  . Urethrotomy N/A 02/04/2013    Procedure: CYSTOSCOPY/URETHROTOMY;  Surgeon: Kathi Ludwig, MD;  Location: [facility] OR;  Service: Urology;  Laterality: N/A;  . Circumcision N/A 02/04/2013    Procedure: CIRCUMCISION ADULT;  Surgeon: Henrene Dodge, MD;  Location: [facility] OR;  Service: Urology;  Laterality: N/A;  . Groin debridement N/A 02/04/2013    Procedure: Penile DEBRIDEMENT;  Surgeon: Henrene Dodge, MD;  Location: [facility] OR;  Service: Urology;  Laterality: N/A;    Family History  Problem Relation Age of Onset  . Diabetes Mother   . Microcephaly Father   . Lung cancer Father     Social History Mr. Sipp reports that he has been smoking Cigarettes.  He has been smoking about 0.25 packs per day. He has never used smokeless tobacco. Mr. Conely reports that he does not drink alcohol.  Review of Systems   Patient denies fever, chills, headache, sweats, rash, change in vision, change in hearing, chest pain, cough, nausea vomiting,. All other systems are reviewed and are negative.  Physical Examination Blood pressure 99/55, pulse 94, temperature 98.8 F (37.1 C), temperature source Oral, resp. rate 16, height 6' (1.829 m), weight 313 lb 0.9 oz (142 kg), SpO2 97.00%.  Intake/Output Summary (Last 24  hours) at 02/10/13 1147 Last data filed at 02/10/13 0815  Gross per 24 hour  Intake   1794 ml  Output    847 ml  Net    947 ml   patient currently is on dialysis. He is oriented to person time and place. Affect is normal. He lying flat in bed. Cardiac exam reveals S1 and S2. The abdomen is soft. I did not examine the abnormal skin findings he has.  Lab Results  Basic Metabolic Panel:  Recent Labs Lab 02/05/13 0430  02/07/13 0400 02/08/13 0440 02/08/13 0930 02/09/13 0253 02/10/13 0510  NA 135*  < > 133* 136* 136* 137 140  K 3.6*  < > 4.1 3.4* 3.4* 3.6* 4.4  CL 93*  < > 92* 94* 95* 93* 94*  CO2 27  < > 25 27 25 26 22   GLUCOSE 117*  < > 169* 95 78 96 63*  BUN 15  < > 27* 18 20 14 14   CREATININE 3.32*  < > 4.54* 3.43* 3.72* 3.31* 3.13*  CALCIUM 8.4  < > 8.6 8.3*  8.2* 8.3* 8.4  MG 1.8  --   --   --   --   --   --   PHOS 3.6  < > 3.4 2.5 2.6 1.5* 1.6*  < > = values in this interval not displayed.  Liver Function Tests:  Recent Labs Lab 02/07/13 0400 02/08/13 0440 02/08/13 0930 02/09/13 0253 02/10/13 0510  ALBUMIN 1.9* 1.8* 1.8* 1.8* 1.8*    CBC:  Recent Labs Lab 02/05/13 0430 02/05/13 2023 02/06/13 0513 02/07/13 0400 02/08/13 0440  WBC 14.1* 18.2* 16.1* 13.1* 11.1*  NEUTROABS 11.1*  --   --   --   --   HGB 6.7* 7.7* 7.8* 7.7* 9.4*  HCT 21.3* 24.4* 24.5* 24.6* 29.4*  MCV 90.3 88.1 89.1 89.5 88.6  PLT 408* 378 415* 407* 368    Cardiac Enzymes: No results found for this basename: CKTOTAL, CKMB, CKMBINDEX, TROPONINI,  in the last 168 hours  BNP: No components found with this basename: POCBNP,    Radiology: No results found.   ECG: EKG on admission is of poor quality. However it appears that he has sinus rhythm at that time. There is decreased anterior R wave progression.  Telemetry:    The patient has had 5 beats of wide complex tachycardia. He has not had any sustained wide-complex tachycardia. The rhythm is irregular. The telemetry quality is poor.  However it is my impression for review today that he may be in atrial fibrillation. The rate is controlled.   Impression and Recommendations    Calciphylaxis     This process leads to significant issues with skin abnormalities. Coumadin affects this process negatively. I have spoken with Dr. Arlean Hopping about the patient. The overall recommendation in general is usually to try other measures and keep the patient on Coumadin if he is a high-risk patient. The patient has documented atrial fibrillation. The patient certainly has indications for anticoagulation. He has congestive heart failure and diabetes. He had significant prior hypertension before dialysis. He does not have the other risk factors of  age, prior stroke, proven vascular disease, male sex. CHADS-Vasc = 3.  It would be optimal to continue his Coumadin. The plan for now will be to continue it,  and other measures will be tried for his calciphylaxis. However if this is not successful, it would be appropriate to stop his Coumadin since the calciphylaxis causes major medical problems for him. The other newer anticoagulants cannot be used in this renal patient.     Type II or unspecified type diabetes mellitus with renal manifestations, uncontrolled   DYSLIPIDEMIA   OBSTRUCTIVE SLEEP APNEA    CARDIOMYOPATHY   Chronic systolic heart failure     The patient's heart failure is controlled with dialysis    GERD   ICD (implantable cardioverter-defibrillator), single, in situ; St. Jude     Patient has an ICD in place.    Normocytic anemia   End stage renal disease   Hypotension, unspecified   Heparin allergy    Atrial fibrillation     Currently his rate is controlled. Amiodarone is to be continued at this time.    Long term (current) use of anticoagulants     I have outlined the plan above to continue his Coumadin at this point.    Left leg pain   Hypotension   Sepsis   Necrosis   Balanitis    H/O amiodarone therapy    Amiodarone  is to be continued at this time.  Jerral Bonito, MD  02/10/2013, 11:47 AM

## 2013-02-10 NOTE — Progress Notes (Signed)
Subjective: Adam [name] feels as though is pain is about the same as it was yesterday- fairly well controlled on his current pain medications. He tells me his pastor has been coming to see him and this has been very good for his spirits. He did ask me today if he was going to die from this and we had a long conversation regarding his prognosis.  Objective: Vital signs in last 24 hours: Filed Vitals:   02/10/13 0850 02/10/13 0900 02/10/13 0930 02/10/13 1000  BP: 97/65 92/50 93/53  98/61  Pulse: 109 91 94 94  Temp:      TempSrc:      Resp: 20 18 19 15   Height:      Weight:      SpO2: 95% 96% 94% 98%   Weight change: -1 lb 15.8 oz (-0.9 kg)  Intake/Output Summary (Last 24 hours) at 02/10/13 1041 Last data filed at 02/10/13 0815  Gross per 24 hour  Intake   1794 ml  Output    847 ml  Net    947 ml   Physical Exam General: lying in bed, NAD HEENT: [place]/AT, vision grossly intact Lungs: CTAB Heart: RRR, no m/g/r Abdomen: soft, obese, NT, +bs Extremities: induration and hyperpigmentation to thighs stable (induration marked with marker yesterday), the erosion to L inner thigh looks to be breaking down again with some clear drainage; area of skin breakdown on R inner thigh is progressing with some clear drainage today; most of his pain is on his upper R thigh as well as bilateral popliteal fossae Neurologic: alert & oriented X3, moves all extremities spontaneously GU: circumscised penis mild tender to palpation, glans is firm; dressing in place over penile shaft, dried up blood on gauze, there is some white drainage over penile shaft, no odor  Lab Results: Basic Metabolic Panel:  Recent Labs Lab 02/05/13 0430  02/09/13 0253 02/10/13 0510  NA 135*  < > 137 140  K 3.6*  < > 3.6* 4.4  CL 93*  < > 93* 94*  CO2 27  < > 26 22  GLUCOSE 117*  < > 96 63*  BUN 15  < > 14 14  CREATININE 3.32*  < > 3.31* 3.13*  CALCIUM 8.4  < > 8.3* 8.4  MG 1.8  --   --   --   PHOS 3.6  < > 1.5* 1.6*  <  > = values in this interval not displayed. Liver Function Tests:  Recent Labs Lab 02/09/13 0253 02/10/13 0510  ALBUMIN 1.8* 1.8*   CBC:  Recent Labs Lab 02/05/13 0430  02/07/13 0400 02/08/13 0440  WBC 14.1*  < > 13.1* 11.1*  NEUTROABS 11.1*  --   --   --   HGB 6.7*  < > 7.7* 9.4*  HCT 21.3*  < > 24.6* 29.4*  MCV 90.3  < > 89.5 88.6  PLT 408*  < > 407* 368  < > = values in this interval not displayed.  CBG:  Recent Labs Lab 02/09/13 0652 02/09/13 1332 02/09/13 1642 02/09/13 2144 02/10/13 0645 02/10/13 0750  GLUCAP 104* 97 145* 128* 69* 95   Coagulation:  Recent Labs Lab 02/07/13 0400 02/08/13 0440 02/09/13 0253 02/10/13 0510  LABPROT 21.5* 22.5* 24.7* 28.5*  INR 1.93* 2.05* 2.32* 2.80*   Anemia Panel:  Recent Labs Lab 02/03/13 1525  FERRITIN 275  TIBC 189*  IRON 56   Urine Drug Screen: Drugs of Abuse     Component Value Date/Time  LABOPIA NEG 03/17/2006 1455   COCAINSCRNUR NEG 03/17/2006 1455   LABBENZ NEG 03/17/2006 1455   AMPHETMU NEG 03/17/2006 1455    Micro Results: Recent Results (from the past 240 hour(s))  CULTURE, BLOOD (ROUTINE X 2)     Status: None   Collection Time    02/03/13  5:45 AM      Result Value Range Status   Specimen Description BLOOD ARM LEFT   Final   Special Requests BOTTLES DRAWN AEROBIC AND ANAEROBIC 10CC   Final   Culture  Setup Time     Final   Value: 02/03/2013 13:55     Performed at [facility]   Culture     Final   Value: NO GROWTH 5 DAYS     Performed at [facility]   Report Status 02/09/2013 FINAL   Final  CULTURE, BLOOD (ROUTINE X 2)     Status: None   Collection Time    02/03/13  5:52 AM      Result Value Range Status   Specimen Description BLOOD HAND LEFT   Final   Special Requests BOTTLES DRAWN AEROBIC ONLY 8CC   Final   Culture  Setup Time     Final   Value: 02/03/2013 13:54     Performed at [facility]   Culture     Final   Value: NO GROWTH 5 DAYS     Performed at  [facility]   Report Status 02/09/2013 FINAL   Final  MRSA PCR SCREENING     Status: Abnormal   Collection Time    02/03/13 12:07 PM      Result Value Range Status   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 It is not     intended to diagnose MRSA     infection nor to guide or     monitor treatment for     MRSA infections.     RESULT CALLED TO, READ BACK BY AND VERIFIED WITH:     A. BRAKE RN 14:30 02/03/13 ([email or web address])  WOUND CULTURE     Status: None   Collection Time    02/04/13 10:38 AM      Result Value Range Status   Specimen Description WOUND PENIS   Final   Special Requests NONE   Final   Gram Stain     Final   Value: FEW WBC PRESENT,BOTH PMN AND MONONUCLEAR     RARE SQUAMOUS EPITHELIAL CELLS PRESENT     MODERATE GRAM NEGATIVE RODS     MODERATE GRAM POSITIVE COCCI IN PAIRS     Performed at [facility]   Culture     Final   Value: MULTIPLE ORGANISMS PRESENT, NONE PREDOMINANT     Note: NO STAPHYLOCOCCUS AUREUS ISOLATED NO GROUP A STREP (S.PYOGENES) ISOLATED     Performed at [facility]   Report Status 02/06/2013 FINAL   Final  URINE CULTURE     Status: None   Collection Time    02/06/13  6:11 AM      Result Value Range Status   Specimen Description URINE, CATHETERIZED   Final   Special Requests NONE   Final  Culture  Setup Time     Final   Value: 02/06/2013 18:02     Performed at [facility] Count     Final   Value: >=100,000 COLONIES/ML     Performed at [facility]   Culture     Final   Value: Multiple bacterial morphotypes present, none predominant. Suggest appropriate recollection if clinically indicated.     Performed at [facility]   Report Status 02/08/2013 FINAL   Final  GC/CHLAMYDIA PROBE AMP     Status: None    Collection Time    02/06/13  6:18 AM      Result Value Range Status   CT Probe RNA NEGATIVE  NEGATIVE Final   GC Probe RNA NEGATIVE  NEGATIVE Final   Comment: (NOTE)                                                                                               **Normal Reference Range: Negative**          Assay performed using the Gen-Probe APTIMA COMBO2 (R) Assay.     Acceptable specimen types for this assay include APTIMA Swabs (Unisex,     endocervical, urethral, or vaginal), first void urine, and ThinPrep     liquid based cytology samples.     Performed at [facility]   Medications: I have reviewed the patient's current medications. Scheduled Meds: . amiodarone  200 mg Oral Daily  . colchicine  0.6 mg Oral Q3 days  . darbepoetin (ARANESP) injection - DIALYSIS  200 mcg Intravenous Q Thu-HD  . diclofenac sodium  2 g Topical QID  . Febuxostat  1 tablet Oral Daily  . insulin aspart  0-9 Units Subcutaneous TID WC  . insulin glargine  35 Units Subcutaneous QHS  . LORazepam  0.5 mg Oral Daily  . midodrine  10 mg Oral BID WC  . multivitamin  1 tablet Oral QHS  . pantoprazole  40 mg Oral QAC breakfast  . phosphorus  250 mg Oral BID  . piperacillin-tazobactam (ZOSYN)  IV  2.25 g Intravenous Q8H  . sodium chloride 0.9 % 100 mL with sodium thiosulfate 25 g  25 g Intravenous Q1200  . traMADol  100 mg Oral BID  . warfarin  2.5 mg Oral ONCE-1800  . 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 Erosion to L medial thigh appears to be further eroded today with some clear  drainage. R inner thigh with erosion and clear drainage. No s/s of infection at this point. Patient does not feel the need for psychology consult given his pastor is visiting him regularly.  -consult cardiology today to obtain recommendations regarding coumadin discontinuation  -continue high flow O2 therapy today, venturi mask at 15LPM for 2 hours daily -sodium thiosulfate 25g IV in  NS over 30-60 mins at the end of each HD session (daily for now) -PTH level 56.5, no need for further decreasing PTH - discontinue all vitamin D and calcium supplementation -consider d/c warfarin as this can exacerbate calciphylaxis -will hold off on biopsy or surgical consultation for now given this will increase risk of superinfection and sepsis -aggressive HD- daily for 7 days (day 4/7), then 5-6 times weekly after that -blood transfusion to keep Hb 9.5-10.5 -continue cinacalcet and sevelamer  Necrosis of distal penile shaft skin and foreskin with possible resolving sepsis--s/p circumcision and debridement of penis 02/04/13 by urology. Continues to be afebrile. I spoke with urology today and they recommending discontinuation of foley if UOP <100cc. I spoke with nurse and UOP approx 50cc in last 24hrs so will d/c foley. Urology will follow on a week by week basis now and see no need for further debridement now, but have not ruled out the possibility of going back to the OR eventually. 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 Efforts to decrease EDW have not been successful 2/2 hypotension. -midodrine BID with improvement of blood pressure  UTI- UA dirty, likely gram negative infection.  -continue zosyn until tomorrow -UCx shows multiple bacterial morphotypes present without any predominant  A Fib: HR  stable. He is on amiodarone 200 mg daily and Coumadin at home.  INR therapeutic now. Will obtain recommendations from cardiology today regarding discontinuing coumadin given risk of worsening calciphylaxis. -coumadin per pharmacy -continue amiodarone  DM-II: A1c 4.9 on 12/13/12, which may not be accurate due to dialysis. Patient takes Lantus 45U qHS at home. CBGs low-normal. This AM one CBG reading of 69, so will further decrease lantus to 30U qHS. -will decrease Lantus to 30U qHS -SSI   CHF with EF of 25%: BW was 318 on 09/25/12-->318lb on admission, today BW is 313lbs. Renal managing volume status with HD. 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    The patient does have a current PCP Evelena Peat, DO) and does need an [facility] hospital follow-up appointment after discharge.  The patient does not have transportation limitations that hinder transportation to clinic appointments.  Services Needed at time of discharge: Y = Yes, Blank = No PT:   OT:   RN:   Equipment:   Other:     LOS: 7 days   Windell Hummingbird, MD 02/10/2013, 10:41 AM

## 2013-02-10 NOTE — Procedures (Deleted)
I was present at this dialysis session, have reviewed the session itself and made  appropriate changes  Vinson Moselle MD (pgr) [phone]    [phone] 02/10/2013, 8:47 AM

## 2013-02-11 ENCOUNTER — Encounter (HOSPITAL_COMMUNITY): Payer: Self-pay | Admitting: Physician Assistant

## 2013-02-11 DIAGNOSIS — E669 Obesity, unspecified: Secondary | ICD-10-CM

## 2013-02-11 DIAGNOSIS — E119 Type 2 diabetes mellitus without complications: Secondary | ICD-10-CM

## 2013-02-11 DIAGNOSIS — L97809 Non-pressure chronic ulcer of other part of unspecified lower leg with unspecified severity: Secondary | ICD-10-CM

## 2013-02-11 DIAGNOSIS — R5381 Other malaise: Secondary | ICD-10-CM

## 2013-02-11 DIAGNOSIS — I5022 Chronic systolic (congestive) heart failure: Secondary | ICD-10-CM

## 2013-02-11 LAB — PROTIME-INR
INR: 3.32 — AB (ref 0.00–1.49)
Prothrombin Time: 32.5 seconds — ABNORMAL HIGH (ref 11.6–15.2)

## 2013-02-11 LAB — BASIC METABOLIC PANEL
BUN: 15 mg/dL (ref 6–23)
CALCIUM: 8.4 mg/dL (ref 8.4–10.5)
CHLORIDE: 92 meq/L — AB (ref 96–112)
CO2: 21 mEq/L (ref 19–32)
Creatinine, Ser: 3.14 mg/dL — ABNORMAL HIGH (ref 0.50–1.35)
GFR calc Af Amer: 25 mL/min — ABNORMAL LOW (ref >90)
GFR calc non Af Amer: 22 mL/min — ABNORMAL LOW (ref >90)
GLUCOSE: 85 mg/dL (ref 70–99)
POTASSIUM: 4.5 meq/L (ref 3.7–5.3)
SODIUM: 140 meq/L (ref 137–147)

## 2013-02-11 LAB — CBC
HEMATOCRIT: 29.6 % — AB (ref 39.0–52.0)
Hemoglobin: 9.7 g/dL — ABNORMAL LOW (ref 13.0–17.0)
MCH: 29.1 pg (ref 26.0–34.0)
MCHC: 32.8 g/dL (ref 30.0–36.0)
MCV: 88.9 fL (ref 78.0–100.0)
PLATELETS: 329 10*3/uL (ref 150–400)
RBC: 3.33 MIL/uL — ABNORMAL LOW (ref 4.22–5.81)
RDW: 18.1 % — ABNORMAL HIGH (ref 11.5–15.5)
WBC: 10.8 10*3/uL — AB (ref 4.0–10.5)

## 2013-02-11 LAB — GLUCOSE, CAPILLARY
GLUCOSE-CAPILLARY: 98 mg/dL (ref 70–99)
GLUCOSE-CAPILLARY: 88 mg/dL (ref 70–99)
Glucose-Capillary: 159 mg/dL — ABNORMAL HIGH (ref 70–99)
Glucose-Capillary: 196 mg/dL — ABNORMAL HIGH (ref 70–99)

## 2013-02-11 LAB — T4, FREE: Free T4: 1.35 ng/dL (ref 0.80–1.80)

## 2013-02-11 LAB — TSH: TSH: 3.927 u[IU]/mL (ref 0.350–4.500)

## 2013-02-11 MED ORDER — HEPARIN SODIUM (PORCINE) 1000 UNIT/ML DIALYSIS: 1000.0000 [IU] | INTRAMUSCULAR | Status: DC | PRN

## 2013-02-11 MED ORDER — LIDOCAINE HCL (PF) 1 % IJ SOLN: 5.0000 mL | INTRAMUSCULAR | Status: DC | PRN

## 2013-02-11 MED ORDER — ALTEPLASE 2 MG IJ SOLR
2.0000 mg | Freq: Once | INTRAMUSCULAR | Status: AC | PRN
  Filled 2013-02-11: qty 2

## 2013-02-11 MED ORDER — SODIUM CHLORIDE 0.9 % IV SOLN: 100.0000 mL | INTRAVENOUS | Status: DC | PRN

## 2013-02-11 MED ORDER — INSULIN GLARGINE 100 UNIT/ML ~~LOC~~ SOLN
28.0000 [IU] | Freq: Every day | SUBCUTANEOUS | Status: DC
  Administered 2013-02-11 – 2013-02-15 (×5): 28 [IU] via SUBCUTANEOUS
  Filled 2013-02-11 (×6): qty 0.28

## 2013-02-11 MED ORDER — PRO-STAT SUGAR FREE PO LIQD
30.0000 mL | Freq: Two times a day (BID) | ORAL | Status: DC
  Administered 2013-02-12 – 2013-02-16 (×7): 30 mL via ORAL
  Filled 2013-02-11 (×11): qty 30

## 2013-02-11 MED ORDER — AMIODARONE HCL 200 MG PO TABS
200.0000 mg | ORAL_TABLET | Freq: Two times a day (BID) | ORAL | Status: DC
  Administered 2013-02-11 – 2013-02-16 (×9): 200 mg via ORAL
  Filled 2013-02-11 (×12): qty 1

## 2013-02-11 MED ORDER — NEPRO/CARBSTEADY PO LIQD
237.0000 mL | ORAL | Status: DC | PRN
  Filled 2013-02-11: qty 237

## 2013-02-11 MED ORDER — BOOST / RESOURCE BREEZE PO LIQD
1.0000 | Freq: Two times a day (BID) | ORAL | Status: DC
  Administered 2013-02-11 – 2013-02-16 (×8): 1 via ORAL

## 2013-02-11 MED ORDER — K PHOS MONO-SOD PHOS DI & MONO 155-852-130 MG PO TABS
250.0000 mg | ORAL_TABLET | Freq: Two times a day (BID) | ORAL | Status: DC
  Administered 2013-02-11 – 2013-02-12 (×2): 250 mg via ORAL
  Filled 2013-02-11 (×3): qty 1

## 2013-02-11 MED ORDER — LIDOCAINE-PRILOCAINE 2.5-2.5 % EX CREA: 1.0000 "application " | TOPICAL_CREAM | CUTANEOUS | Status: DC | PRN

## 2013-02-11 MED ORDER — PENTAFLUOROPROP-TETRAFLUOROETH EX AERO: 1.0000 "application " | INHALATION_SPRAY | CUTANEOUS | Status: DC | PRN

## 2013-02-11 NOTE — Progress Notes (Addendum)
Subjective: Mr. Hammitt pain is improving and is currently well controlled on his pain regimen. He has no other complaints this morning. Cardiology saw patient yesterday and recommended continuing his coumadin. PT/OT saw patient and recommended CIR.  Objective: Vital signs in last 24 hours: Filed Vitals:   02/10/13 1624 02/10/13 2041 02/10/13 2330 02/11/13 0620  BP: 90/60 86/67 96/60  90/67  Pulse:  88  112  Temp:  98.8 F (37.1 C)  98.1 F (36.7 C)  TempSrc:  Oral  Oral  Resp:  20  20  Height:      Weight:  315 lb 0.6 oz (142.9 kg)    SpO2:  96%  99%   Weight change: 1 lb 15.8 oz (0.9 kg)  Intake/Output Summary (Last 24 hours) at 02/11/13 1610 Last data filed at 02/10/13 1829  Gross per 24 hour  Intake    170 ml  Output   1500 ml  Net  -1330 ml   Physical Exam General: lying in bed, NAD; good spirits this morning and feels like he is improving HEENT: [place]/AT, vision grossly intact Lungs: CTAB Heart: RRR, no m/g/r Abdomen: soft, obese, NT, +bs Extremities: induration and hyperpigmentation to thighs improving (induration marked with marker on 1/14), the erosion to L inner thigh continuing to break down with small amount of clear drainage; area of skin breakdown on R inner thigh is stable; loose dressings in place Neurologic: alert & oriented X3, moves all extremities spontaneously GU: circumscised penis mildly tender to palpation, glans is firm; dressing in place over penile shaft, dried up serous drainage on gauze, there is some serous drainage over penile shaft, no odor   Lab Results: Basic Metabolic Panel:  Recent Labs Lab 02/05/13 0430  02/09/13 0253 02/10/13 0510 02/11/13 0516  NA 135*  < > 137 140 140  K 3.6*  < > 3.6* 4.4 4.5  CL 93*  < > 93* 94* 92*  CO2 27  < > 26 22 21   GLUCOSE 117*  < > 96 63* 85  BUN 15  < > 14 14 15   CREATININE 3.32*  < > 3.31* 3.13* 3.14*  CALCIUM 8.4  < > 8.3* 8.4 8.4  MG 1.8  --   --   --   --   PHOS 3.6  < > 1.5* 1.6*  --   < >  = values in this interval not displayed. Liver Function Tests:  Recent Labs Lab 02/09/13 0253 02/10/13 0510  ALBUMIN 1.8* 1.8*   CBC:  Recent Labs Lab 02/05/13 0430  02/07/13 0400 02/08/13 0440  WBC 14.1*  < > 13.1* 11.1*  NEUTROABS 11.1*  --   --   --   HGB 6.7*  < > 7.7* 9.4*  HCT 21.3*  < > 24.6* 29.4*  MCV 90.3  < > 89.5 88.6  PLT 408*  < > 407* 368  < > = values in this interval not displayed.  CBG:  Recent Labs Lab 02/10/13 0645 02/10/13 0750 02/10/13 1344 02/10/13 1709 02/10/13 2040 02/11/13 0553  GLUCAP 69* 95 107* 135* 121* 98   Coagulation:  Recent Labs Lab 02/08/13 0440 02/09/13 0253 02/10/13 0510 02/11/13 0516  LABPROT 22.5* 24.7* 28.5* 32.5*  INR 2.05* 2.32* 2.80* 3.32*   Urine Drug Screen: Drugs of Abuse     Component Value Date/Time   LABOPIA NEG 03/17/2006 1455   COCAINSCRNUR NEG 03/17/2006 1455   LABBENZ NEG 03/17/2006 1455   AMPHETMU NEG 03/17/2006 1455    Micro  Results: Recent Results (from the past 240 hour(s))  CULTURE, BLOOD (ROUTINE X 2)     Status: None   Collection Time    02/03/13  5:45 AM      Result Value Range Status   Specimen Description BLOOD ARM LEFT   Final   Special Requests BOTTLES DRAWN AEROBIC AND ANAEROBIC 10CC   Final   Culture  Setup Time     Final   Value: 02/03/2013 13:55     Performed at [facility]   Culture     Final   Value: NO GROWTH 5 DAYS     Performed at [facility]   Report Status 02/09/2013 FINAL   Final  CULTURE, BLOOD (ROUTINE X 2)     Status: None   Collection Time    02/03/13  5:52 AM      Result Value Range Status   Specimen Description BLOOD HAND LEFT   Final   Special Requests BOTTLES DRAWN AEROBIC ONLY 8CC   Final   Culture  Setup Time     Final   Value: 02/03/2013 13:54     Performed at [facility]   Culture     Final   Value: NO GROWTH 5 DAYS     Performed at [facility]   Report Status 02/09/2013 FINAL   Final  MRSA PCR SCREENING      Status: Abnormal   Collection Time    02/03/13 12:07 PM      Result Value Range Status   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 It is not     intended to diagnose MRSA     infection nor to guide or     monitor treatment for     MRSA infections.     RESULT CALLED TO, READ BACK BY AND VERIFIED WITH:     A. BRAKE RN 14:30 02/03/13 ([email or web address])  WOUND CULTURE     Status: None   Collection Time    02/04/13 10:38 AM      Result Value Range Status   Specimen Description WOUND PENIS   Final   Special Requests NONE   Final   Gram Stain     Final   Value: FEW WBC PRESENT,BOTH PMN AND MONONUCLEAR     RARE SQUAMOUS EPITHELIAL CELLS PRESENT     MODERATE GRAM NEGATIVE RODS     MODERATE GRAM POSITIVE COCCI IN PAIRS     Performed at [facility]   Culture     Final   Value: MULTIPLE ORGANISMS PRESENT, NONE PREDOMINANT     Note: NO STAPHYLOCOCCUS AUREUS ISOLATED NO GROUP A STREP (S.PYOGENES) ISOLATED     Performed at [facility]   Report Status 02/06/2013 FINAL   Final  URINE CULTURE     Status: None   Collection Time    02/06/13  6:11 AM      Result Value Range Status   Specimen Description URINE, CATHETERIZED   Final   Special Requests NONE   Final   Culture  Setup Time     Final   Value: 02/06/2013 18:02     Performed at [facility]  Count     Final   Value: >=100,000 COLONIES/ML     Performed at [facility]   Culture     Final   Value: Multiple bacterial morphotypes present, none predominant. Suggest appropriate recollection if clinically indicated.     Performed at [facility]   Report Status 02/08/2013 FINAL   Final  GC/CHLAMYDIA PROBE AMP     Status: None   Collection Time    02/06/13  6:18 AM      Result Value Range Status   CT Probe RNA NEGATIVE   NEGATIVE Final   GC Probe RNA NEGATIVE  NEGATIVE Final   Comment: (NOTE)                                                                                               **Normal Reference Range: Negative**          Assay performed using the Gen-Probe APTIMA COMBO2 (R) Assay.     Acceptable specimen types for this assay include APTIMA Swabs (Unisex,     endocervical, urethral, or vaginal), first void urine, and ThinPrep     liquid based cytology samples.     Performed at [facility]   Medications: I have reviewed the patient's current medications. Scheduled Meds: . amiodarone  200 mg Oral Daily  . colchicine  0.6 mg Oral Q3 days  . darbepoetin (ARANESP) injection - DIALYSIS  200 mcg Intravenous Q Thu-HD  . diclofenac sodium  2 g Topical QID  . Febuxostat  1 tablet Oral Daily  . insulin aspart  0-9 Units Subcutaneous TID WC  . insulin glargine  30 Units Subcutaneous QHS  . LORazepam  0.5 mg Oral Daily  . midodrine  10 mg Oral BID WC  . multivitamin  1 tablet Oral QHS  . pantoprazole  40 mg Oral QAC breakfast  . phosphorus  250 mg Oral BID  . piperacillin-tazobactam (ZOSYN)  IV  2.25 g Intravenous Q8H  . sodium chloride 0.9 % 100 mL with sodium thiosulfate 25 g  25 g Intravenous Q1200  . traMADol  100 mg Oral BID  . 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 Erosion to L medial thigh appears to be somewhat further eroded today with some clear drainage. R inner thigh with erosion that is stable. No s/s of infection at this point.  - continue midodrine BID; BP has improved -will check Hb today- I discussed morning labs with nephrology and we will plan  to do renal panel and CBC every other day from now on  Continue plan for calciphylaxis treatment: -continue coumadin per cardiology's recommendation -continue high flow O2 therapy (venturi mask at 15LPM for 2 hours daily) -sodium thiosulfate 25g IV in 100ml NS over 30-60 mins at the end of each HD session - NO vitamin D and calcium  supplementation -aggressive HD- daily for 7 days (day 5/7), then 5-6 times weekly after that -blood transfusion to keep Hb 9.5-10.5 -continue cinacalcet and sevelamer  Necrosis of distal penis and foreskin--s/p circumcision and debridement of penis 02/04/13 by urology. Continues to be afebrile. Urology will follow weekly. Foley removed yesterday. -discontinue abx today (urology agrees)  UTI- Likely gram negative infection, though UCX did not isolate specific organism. -d/c zosyn  A Fib: HR stable. He is in and out of afib. INR slightly supratherapeutic today. -coumadin per pharmacy -continue amiodarone  DM-II: A1c 4.9 on 12/13/12, which may not be accurate due to dialysis. Patient takes Lantus 45U qHS at home. CBGs continue to be low-normal. CBGs 98-135 overnight. -will further decrease Lantus to 28U qHS -SSI   CHF with EF of 25%: BW was 318 on 09/25/12-->318lb on admission, today BW is 315 lbs. Renal managing volume status with HD. 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    The patient does have a current PCP Evelena Peat, DO) and does need an [facility] hospital follow-up appointment after discharge.  The patient does not have transportation limitations that hinder transportation to clinic appointments.  Services Needed at time of discharge: Y = Yes, Blank = No PT:   OT:   RN:   Equipment:   Other:     LOS: 8 days   Windell Hummingbird, MD 02/11/2013, 8:19 AM

## 2013-02-11 NOTE — Progress Notes (Signed)
Pt up in chair with minimal assit. Vent reapplied @ this time . Pt had smear for BM. Will continue to monitor

## 2013-02-11 NOTE — Progress Notes (Signed)
Discussed with Dr. Darci Needle. She is requesting ip rehab consult and ordered. [phone]

## 2013-02-11 NOTE — Discharge Instructions (Addendum)
Wound Care Wound care helps prevent pain and infection.  You may need a tetanus shot if:  You cannot remember when you had your last tetanus shot.  You have never had a tetanus shot.  The injury broke your skin. If you need a tetanus shot and you choose not to have one, you may get tetanus. Sickness from tetanus can be serious. HOME CARE   Only take medicine as told by your doctor.  Clean the wound daily with mild soap and water.  Change any bandages (dressings) as told by your doctor.  Put medicated cream and a bandage on the wound as told by your doctor.  Change the bandage if it gets wet, dirty, or starts to smell.  Take showers. Do not take baths, swim, or do anything that puts your wound under water.  Rest and raise (elevate) the wound until the pain and puffiness (swelling) are better.  Keep all doctor visits as told. GET HELP RIGHT AWAY IF:   Yellowish-white fluid (pus) comes from the wound.  Medicine does not lessen your pain.  There is a red streak going away from the wound.  You have a fever. MAKE SURE YOU:   Understand these instructions.  Will watch your condition.  Will get help right away if you are not doing well or get worse. Document Released: 10/23/2007 Document Revised: 04/07/2011 Document Reviewed: 05/19/2010 [facility] Patient Information 2014 [employer] [place].   Information on my medicine - Coumadin   (Warfarin)  This medication education was reviewed with me or my healthcare representative as part of my discharge preparation.  The pharmacist that spoke with me during my hospital stay was:  Lennon AlstromDang, Thuy Dien, [facility]RPH  Why was Coumadin prescribed for you? Coumadin was prescribed for you because you have a blood clot or a medical condition that can cause an increased risk of forming blood clots. Blood clots can cause serious health problems by blocking the flow of blood to the heart, lung, or brain. Coumadin can prevent harmful blood clots from  forming. As a reminder your indication for Coumadin is:   Stroke Prevention Because Of Atrial Fibrillation  What test will check on my response to Coumadin? While on Coumadin (warfarin) you will need to have an INR test regularly to ensure that your dose is keeping you in the desired range. The INR (international normalized ratio) number is calculated from the result of the laboratory test called prothrombin time (PT).  If an INR APPOINTMENT HAS NOT ALREADY BEEN MADE FOR YOU please schedule an appointment to have this lab work done by your health care provider within 7 days. Your INR goal is usually a number between:  2 to 3 or your provider may give you a more narrow range like 2-2.5.  Ask your health care provider during an office visit what your goal INR is.  What  do you need to  know  About  COUMADIN? Take Coumadin (warfarin) exactly as prescribed by your healthcare provider about the same time each day.  DO NOT stop taking without talking to the doctor who prescribed the medication.  Stopping without other blood clot prevention medication to take the place of Coumadin may increase your risk of developing a new clot or stroke.  Get refills before you run out.  What do you do if you miss a dose? If you miss a dose, take it as soon as you remember on the same day then continue your regularly scheduled regimen the next day.  Do not take two doses of Coumadin at the same time.  Important Safety Information A possible side effect of Coumadin (Warfarin) is an increased risk of bleeding. You should call your healthcare provider right away if you experience any of the following:   Bleeding from an injury or your nose that does not stop.   Unusual colored urine (red or dark brown) or unusual colored stools (red or black).   Unusual bruising for unknown reasons.   A serious fall or if you hit your head (even if there is no bleeding).  Some foods or medicines interact with Coumadin (warfarin) and  might alter your response to warfarin. To help avoid this:   Eat a balanced diet, maintaining a consistent amount of Vitamin K.   Notify your provider about major diet changes you plan to make.   Avoid alcohol or limit your intake to 1 drink for women and 2 drinks for men per day. (1 drink is 5 oz. wine, 12 oz. beer, or 1.5 oz. liquor.)  Make sure that ANY health care provider who prescribes medication for you knows that you are taking Coumadin (warfarin).  Also make sure the healthcare provider who is monitoring your Coumadin knows when you have started a new medication including herbals and non-prescription products.  Coumadin (Warfarin)  Major Drug Interactions  Increased Warfarin Effect Decreased Warfarin Effect  Alcohol (large quantities) Antibiotics (esp. Septra/Bactrim, Flagyl, Cipro) Amiodarone (Cordarone) Aspirin (ASA) Cimetidine (Tagamet) Megestrol (Megace) NSAIDs (ibuprofen, naproxen, etc.) Piroxicam (Feldene) Propafenone (Rythmol SR) Propranolol (Inderal) Isoniazid (INH) Posaconazole (Noxafil) Barbiturates (Phenobarbital) Carbamazepine (Tegretol) Chlordiazepoxide (Librium) Cholestyramine (Questran) Griseofulvin Oral Contraceptives Rifampin Sucralfate (Carafate) Vitamin K   Coumadin (Warfarin) Major Herbal Interactions  Increased Warfarin Effect Decreased Warfarin Effect  Garlic Ginseng Ginkgo biloba Coenzyme Q10 Green tea St. Johns wort    Coumadin (Warfarin) FOOD Interactions  Eat a consistent number of servings per week of foods HIGH in Vitamin K (1 serving =  cup)  Collards (cooked, or boiled & drained) Kale (cooked, or boiled & drained) Mustard greens (cooked, or boiled & drained) Parsley *serving size only =  cup Spinach (cooked, or boiled & drained) Swiss chard (cooked, or boiled & drained) Turnip greens (cooked, or boiled & drained)  Eat a consistent number of servings per week of foods MEDIUM-HIGH in Vitamin K (1 serving = 1 cup)  Asparagus  (cooked, or boiled & drained) Broccoli (cooked, boiled & drained, or raw & chopped) Brussel sprouts (cooked, or boiled & drained) *serving size only =  cup Lettuce, raw (green leaf, endive, romaine) Spinach, raw Turnip greens, raw & chopped   These websites have more information on Coumadin (warfarin):  http://www.king-russell.com/; https://www.hines.net/;

## 2013-02-11 NOTE — Progress Notes (Signed)
Late Entry for 02/10/2013: At bedtime Patient expressed feelings of being scared regarding the outcome of his health. Patient was almost in tears for he also stated that he feels lonely. Patient states he has family but are not able to help support as far as emotional needs for they have their health to worry about. Explained to patient that he is on antibotics and that will help promote healing within his body. Patient understood but had a sad affect. Notified day nurse to see if a Psych Consult could be mentioned in progression. Will notate in sticky note of this concern with the patient and continue to monitor patient to the end of the shift.

## 2013-02-11 NOTE — Progress Notes (Signed)
Rec Pt back from HD, wound care done. Prn given for pain. Night RN said pt was mild depression doing the night. Will continue to monitor

## 2013-02-11 NOTE — Progress Notes (Signed)
ANTICOAGULATION CONSULT NOTE - Follow Up Consult  Pharmacy Consult for Coumadin Indication: atrial fibrillation   Allergies  Allergen Reactions  . Argatroban Other (See Comments)    Ventricular tachycardia  . Ace Inhibitors     Acute renal failure with multiple trials  . Heparin     Thrombcytopenia. SRA positive. See miscellaneous test (SRA results)    Patient Measurements: Height: 6' (182.9 cm) Weight: 315 lb 4.1 oz (143 kg) IBW/kg (Calculated) : 77.6 Heparin Dosing Weight:    Vital Signs: Temp: 98.2 F (36.8 C) (01/16 0800) Temp src: Oral (01/16 0800) BP: 84/63 mmHg (01/16 0900) Pulse Rate: 100 (01/16 0900)  Labs:  Recent Labs  02/09/13 0253 02/10/13 0510 02/11/13 0516  LABPROT 24.7* 28.5* 32.5*  INR 2.32* 2.80* 3.32*  CREATININE 3.31* 3.13* 3.14*    Estimated Creatinine Clearance: 42.2 ml/min (by C-G formula based on Cr of 3.14).   Assessment: Admit CC: penis pain/discharge and leg pain  Anticoagulation: Coumadin for Afib, INR 2.2 on admit (3.75mg  daily except 2.5mg  Mon PTA), HIT+, INR 3.32 today.  ID: Zosyn x7d for penile / thigh wounds >> gangrene of penile shaft s/p circumcision and debridement 1/9, Penile gangrene / L thigh gangrene / diffuse bilat LE discoloration w induration/pain: clinical presentation c/w calciphylaxis . POD #8. Afebrile, WBC down 11.1  Augmentin 1/8 >> 1/10 Vanc 1/10 >> 1/12 Zosyn 1/10 >>1/15  1/11: UCx: >100,000 multiple bacteria 1/9: Wound cx - Multiple organisms, none predominant. 1/8: Blood Cx x2 - Negative 1/8 MRSA PCR - positive  Cardiovascular: hx CHF (EF 25%), afib, PPM, HTN, ICD, CHADS2=3 - hypotensive, HR 86-112- po amiodarone, midodrine started. Cardiology recommends continuing Coumadin for now.  Endocrinology: Gout on colchicine/febuxostat/allopurinol. DM2, CBGs 95-135 (in the 60's the last 2 mornings) on SSI + Lantus 28/d (decreased 1/15)  GI: GERD - renal diet, PPI PO  Neuro: scheduled Ativan, tramadol,  voltaren gel  Nephrology: ESRD on HD TTS PTA, Meds: Sensipar, Renavite, Renagel, Aranesp/Thurs. To get sodium thiosulfate daily for calciphylaxis--HD 4.5hrs daily x 7 then 5-6x/wk until wounds stabilize  Pulmonary: OSA / +tobacco -98%  Hematology / Oncology: hgb 9.4 (BL ~8 over past 5 months), plts 368 - on Aranesp/Thurs  Best Practices: Coumadin, po PPI  Goal of Therapy:  INR 2-3 Monitor platelets by anticoagulation protocol: Yes  Plan:  Hold Coumadin x 1 day. Daily INR.   Catlyn Shipton S. Merilynn Finland, PharmD, BCPS Clinical Staff Pharmacist Pager [phone]  Misty Stanley Stillinger 02/11/2013,9:27 AM

## 2013-02-11 NOTE — Progress Notes (Signed)
PT Cancellation Note  Patient Details Name: Adam [name] MRN: [id number] DOB: 12-28-1965   Cancelled Treatment:    Reason Eval/Treat Not Completed: Patient at procedure or test/unavailable.  Pt at dialysis.  Will check back as schedule permits.   Chianti Goh LUBECK 02/11/2013, 1:14 PM

## 2013-02-11 NOTE — Progress Notes (Addendum)
Pt placed on vent mask @ this time per order. Pt removed mask @ 330. Pt states he will finish later today. Family in room  @ 350

## 2013-02-11 NOTE — Progress Notes (Signed)
Internal Medicine Attending  Date: 02/11/2013  Patient name: Adam [name] Medical record number: [id number] Date of birth: [birth year] Age: 48 y.o. Gender: male  I saw and evaluated the patient, and discussed his care on A.M rounds with housestaff.  I reviewed the resident's note by Dr. Darci Needle and I agree with the resident's findings and plans as documented in her note.

## 2013-02-11 NOTE — Progress Notes (Signed)
Patient: Adam [name] / Admit Date: 02/03/2013 / Date of Encounter: 02/11/2013, 12:31 PM   Subjective  Feeling down and out. No specific complaints. No CP or SOB.  Objective   Telemetry: back in AF, rate relatively controlled, PVCs, triplet  Physical Exam: Blood pressure 89/60, pulse 85, temperature 98.2 F (36.8 C), temperature source Oral, resp. rate 20, height 6' (1.829 m), weight 315 lb 4.1 oz (143 kg), SpO2 98.00%. General: Well developed, well nourished chronically ill AAM in no acute distress. Head: Normocephalic, atraumatic,sclera slightly icteric, no xanthomas, nares are without discharge. Neck: JVP mod elevated. Lungs: Clear bilaterally to auscultation without wheezes, rales, or rhonchi. Breathing is unlabored. Heart: Irreg irreg, S1 S2 without murmurs, rubs, or gallops.  Abdomen: Soft, non-tender, non-distended with normoactive bowel sounds. No rebound/guarding. Extremities: No clubbing or cyanosis. Bilateral LE appear grossly enlarged with skin thickening and darkening and various nodules; did not examine under wrappings. Distal pedal pulses are 2+ and equal bilaterally. Neuro: Alert and oriented X 3. Moves all extremities spontaneously. Psych:  Responds to questions appropriately with a depressed affect.   Intake/Output Summary (Last 24 hours) at 02/11/13 1231 Last data filed at 02/10/13 1829  Gross per 24 hour  Intake    170 ml  Output   1500 ml  Net  -1330 ml    Inpatient Medications:  . amiodarone  200 mg Oral Daily  . colchicine  0.6 mg Oral Q3 days  . darbepoetin (ARANESP) injection - DIALYSIS  200 mcg Intravenous Q Thu-HD  . diclofenac sodium  2 g Topical QID  . Febuxostat  1 tablet Oral Daily  . insulin aspart  0-9 Units Subcutaneous TID WC  . insulin glargine  28 Units Subcutaneous QHS  . LORazepam  0.5 mg Oral Daily  . midodrine  10 mg Oral BID WC  . multivitamin  1 tablet Oral QHS  . pantoprazole  40 mg Oral QAC breakfast  . phosphorus  250 mg Oral BID   . sodium chloride 0.9 % 100 mL with sodium thiosulfate 25 g  25 g Intravenous Q1200  . traMADol  100 mg Oral BID  . Warfarin - Pharmacist Dosing Inpatient   Does not apply q1800   Infusions:    Labs:  Recent Labs  02/09/13 0253 02/10/13 0510 02/11/13 0516  NA 137 140 140  K 3.6* 4.4 4.5  CL 93* 94* 92*  CO2 26 22 21   GLUCOSE 96 63* 85  BUN 14 14 15   CREATININE 3.31* 3.13* 3.14*  CALCIUM 8.3* 8.4 8.4  PHOS 1.5* 1.6*  --     Recent Labs  02/09/13 0253 02/10/13 0510  ALBUMIN 1.8* 1.8*   No results found for this basename: WBC, NEUTROABS, HGB, HCT, MCV, PLT,  in the last 72 hours No results found for this basename: CKTOTAL, CKMB, TROPONINI,  in the last 72 hours No components found with this basename: POCBNP,  No results found for this basename: HGBA1C,  in the last 72 hours   Radiology/Studies:  Dg Chest 2 View  02/03/2013   CLINICAL DATA:  Short of breath.  Dialysis patient  EXAM: CHEST  2 VIEW  COMPARISON:  09/10/2012.  FINDINGS: Marked cardiac enlargement without fluid overload. Negative for edema or effusion. Negative for pneumonia. AICD unchanged in position.  IMPRESSION: Marked cardiac enlargement.  No acute abnormality   Electronically Signed   By: Marlan Palau M.D.   On: 02/03/2013 07:06   Ct Pelvis Wo Contrast  02/03/2013   CLINICAL  DATA:  Wounds on the left thigh and dorsum of the left foot. Blistering left leg. Elevated white blood cell count.  EXAM: CT TIBIA FIBULA LEFT WITHOUT CONTRAST; CT OF THE LEFT FEMUR WITHOUT CONTRAST; CT PELVIS WITHOUT CONTRAST  TECHNIQUE: Multidetector CT imaging of the pelvis, left femur and left lower leg was performed following the standard protocol without intravenous contrast.  COMPARISON:  None.  FINDINGS: PELVIS:  There is mild infiltration of subcutaneous fat about the pelvis. No focal fluid collection is identified and there is no fluid collection within the pelvis itself. There is no lymphadenopathy. No bony destructive change or  other bony abnormality is identified. No hip joint effusion is seen.  LEFT THIGH:  The right thigh is partially visualized on the axial and coronal images. Cutaneous tissues appear thickened bilaterally. There is also infiltration of subcutaneous fat bilaterally which appears fairly symmetric. No focal fluid collection is identified. Fat planes within muscle are preserved. Multiple varicosities are noted. No muscle or tendon tear is seen. No radiopaque foreign body is identified. There is atherosclerotic vascular disease. No bony destructive change or other focal bony lesion is identified.  LEFT LOWER LEG:  Cutaneous tissues appear thickened about the medial margin of the left lower leg where there appear to be blisters at the skin surface. There is some infiltration of subcutaneous fat about the lower legs bilaterally, worse on the left. Fat planes within muscle are preserved. No focal fluid collection is seen in the subcutaneous or muscular tissues. No bony destructive change or other focal bony lesion is present. All visualized muscles and tendons appear intact.  IMPRESSION: Findings compatible cellulitis and/or dependent change about both legs and the pelvis. Blistering along the skin surface is seen in the left lower leg. No abscess is identified and there is no CT evidence of osteomyelitis.  Atherosclerosis.   Electronically Signed   By: Drusilla Kanner M.D.   On: 02/03/2013 08:30   Ct Femur Left Wo Contrast  02/03/2013   CLINICAL DATA:  Wounds on the left thigh and dorsum of the left foot. Blistering left leg. Elevated white blood cell count.  EXAM: CT TIBIA FIBULA LEFT WITHOUT CONTRAST; CT OF THE LEFT FEMUR WITHOUT CONTRAST; CT PELVIS WITHOUT CONTRAST  TECHNIQUE: Multidetector CT imaging of the pelvis, left femur and left lower leg was performed following the standard protocol without intravenous contrast.  COMPARISON:  None.  FINDINGS: PELVIS:  There is mild infiltration of subcutaneous fat about the  pelvis. No focal fluid collection is identified and there is no fluid collection within the pelvis itself. There is no lymphadenopathy. No bony destructive change or other bony abnormality is identified. No hip joint effusion is seen.  LEFT THIGH:  The right thigh is partially visualized on the axial and coronal images. Cutaneous tissues appear thickened bilaterally. There is also infiltration of subcutaneous fat bilaterally which appears fairly symmetric. No focal fluid collection is identified. Fat planes within muscle are preserved. Multiple varicosities are noted. No muscle or tendon tear is seen. No radiopaque foreign body is identified. There is atherosclerotic vascular disease. No bony destructive change or other focal bony lesion is identified.  LEFT LOWER LEG:  Cutaneous tissues appear thickened about the medial margin of the left lower leg where there appear to be blisters at the skin surface. There is some infiltration of subcutaneous fat about the lower legs bilaterally, worse on the left. Fat planes within muscle are preserved. No focal fluid collection is seen in the subcutaneous or  muscular tissues. No bony destructive change or other focal bony lesion is present. All visualized muscles and tendons appear intact.  IMPRESSION: Findings compatible cellulitis and/or dependent change about both legs and the pelvis. Blistering along the skin surface is seen in the left lower leg. No abscess is identified and there is no CT evidence of osteomyelitis.  Atherosclerosis.   Electronically Signed   By: Drusilla Kannerhomas  Dalessio M.D.   On: 02/03/2013 08:30   Ct Tibia Fibula Left Wo Contrast  02/03/2013   CLINICAL DATA:  Wounds on the left thigh and dorsum of the left foot. Blistering left leg. Elevated white blood cell count.  EXAM: CT TIBIA FIBULA LEFT WITHOUT CONTRAST; CT OF THE LEFT FEMUR WITHOUT CONTRAST; CT PELVIS WITHOUT CONTRAST  TECHNIQUE: Multidetector CT imaging of the pelvis, left femur and left lower leg was  performed following the standard protocol without intravenous contrast.  COMPARISON:  None.  FINDINGS: PELVIS:  There is mild infiltration of subcutaneous fat about the pelvis. No focal fluid collection is identified and there is no fluid collection within the pelvis itself. There is no lymphadenopathy. No bony destructive change or other bony abnormality is identified. No hip joint effusion is seen.  LEFT THIGH:  The right thigh is partially visualized on the axial and coronal images. Cutaneous tissues appear thickened bilaterally. There is also infiltration of subcutaneous fat bilaterally which appears fairly symmetric. No focal fluid collection is identified. Fat planes within muscle are preserved. Multiple varicosities are noted. No muscle or tendon tear is seen. No radiopaque foreign body is identified. There is atherosclerotic vascular disease. No bony destructive change or other focal bony lesion is identified.  LEFT LOWER LEG:  Cutaneous tissues appear thickened about the medial margin of the left lower leg where there appear to be blisters at the skin surface. There is some infiltration of subcutaneous fat about the lower legs bilaterally, worse on the left. Fat planes within muscle are preserved. No focal fluid collection is seen in the subcutaneous or muscular tissues. No bony destructive change or other focal bony lesion is present. All visualized muscles and tendons appear intact.  IMPRESSION: Findings compatible cellulitis and/or dependent change about both legs and the pelvis. Blistering along the skin surface is seen in the left lower leg. No abscess is identified and there is no CT evidence of osteomyelitis.  Atherosclerosis.   Electronically Signed   By: Drusilla Kannerhomas  Dalessio M.D.   On: 02/03/2013 08:30     Assessment and Plan  1. 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 Myra GianottiBrabham this admission 2. 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 UTI, UCx multiple morphs 4. Paroxysmal atrial flutter/atrial fibrillation dx 08/2012 5. Paroxysmal VT 08/2012 in setting of long QT/argatroban, not on BB due to hypotension with dialysis 6. Chronic combined systolic and diastolic CHF/NICM EF 25% in 12/2011, [employer] AICD placed 2011, cath 2006 normal coronaries, not on ACEI due to hypotension 7. ESRD on HD since August 8. Severe hypoalbuminemia 1.9 9. Acute on chronic anemia - admit 6.7, s/p transfusion 10. Morbid obesity Body mass index is 42.15 kg/(m^2). 11. Probable depression  See Dr. Henrietta HooverKatz's note. The clinical question at hand was regarding continuation of Coumadin in setting of calciphylaxis. CHADSVASC = 3. His recommendation was to continue, and other measures will be tried for his calciphylaxis. If this is in unsuccessful, would be appropriate to stop Coumadin since the calciphylaxis puts him at risk  for future infective complications. Will also need to monitor blood counts carefully given significant anemia history. He has history of HITT and VT with argatroban so other anticoag opti ons are limited. Not a candidate for NOAC with renal failure. He is in and out of AF this admission and remains on amiodarone daily. Would continue this for rate control as his BP is too low for any AV nodal blocking agents. Will review CXR with MD to determine if repeat echo necessary given progressive cardiomegaly. Will update thyroid function while he's here given borderline TSH in the fall.  Signed, Ronie Spies PA-C  The patient was seen, examined and discussed with Ronie Spies, PA-C and I agree with the above.   This is a very unfortunate 48 year old male with a rare condition - calciphylaxis (consequence of ESRD) resulting in B/L leg lesions and penile necrosis with infections.  The patient has a paroxysmal A-fib on chronic PO amiodarone and coumadine. However Coumadine might further worsen caciphylaxis, we would recommend to  discontinue. Other options for anticoagulations are limited with ESRD and h/o HIT.  We would recommend to increase Amiodarone to 200 mg po BID for now. The patient is bradycardiac and not amenable for ant AVB agent.   His last echocardiogram in 12/2011 showed LVEF 25%, moderate LV dilatation. His CXR currently shows significant cardiomegaly, no CHF. We will repeat echocardiogram.   Adam [name], Rexene Edison 02/11/2013

## 2013-02-11 NOTE — Progress Notes (Signed)
Subjective:  Seen on dialysis, no current complaints, but concerned about prognosis  Objective: Vital signs in last 24 hours: Temp:  [97.7 F (36.5 C)-98.8 F (37.1 C)] 98.2 F (36.8 C) (01/16 0800) Pulse Rate:  [83-117] 83 (01/16 1030) Resp:  [16-20] 20 (01/16 0800) BP: (84-126)/(52-73) 88/65 mmHg (01/16 1030) SpO2:  [92 %-99 %] 98 % (01/16 0800) Weight:  [140.5 kg (309 lb 11.9 oz)-143 kg (315 lb 4.1 oz)] 143 kg (315 lb 4.1 oz) (01/16 0800) Weight change: 0.9 kg (1 lb 15.8 oz)  Intake/Output from previous day: 01/15 0701 - 01/16 0700 In: 290 [P.O.:240; IV Piggyback:50] Out: 1500    Lab Results: No results found for this basename: WBC, HGB, HCT, PLT,  in the last 72 hours BMET:  Recent Labs  02/09/13 0253 02/10/13 0510 02/11/13 0516  NA 137 140 140  K 3.6* 4.4 4.5  CL 93* 94* 92*  CO2 26 22 21   GLUCOSE 96 63* 85  BUN 14 14 15   CREATININE 3.31* 3.13* 3.14*  CALCIUM 8.3* 8.4 8.4  ALBUMIN 1.8* 1.8*  --    No results found for this basename: PTH,  in the last 72 hours Iron Studies: No results found for this basename: IRON, TIBC, TRANSFERRIN, FERRITIN,  in the last 72 hours  EXAM: General appearance:  Alert, in no apparent distress Resp:  CTA without rales, rhonchi, or wheezes Cardio:  RRR without murmur or rub GI:  + BS, soft and nontender Extremities:  1+ pretibial edema bilaterally Access:  AVF @ RFA with BFR 400  [facility]  4.75h F180 Bath 2K/2.25Ca DW 143.5kg RFA AVF No heparin  EPO 22K Hect 6ug No Fe at center  Assessment/Plan: 1. Penile gangrene / L thigh gangrene / diffuse bilat LE discoloration w induration/pain: clinical presentation c/w calciphylaxis; prognosis guarded. Per UTD recommendations are:  1. Daily HD x 7d then 5-6days/wk until wounds stabilize 2. PTH lowering- not an issue as PTH is already under low at 56 3. Na thiosulfate 4. Avoid Ca-containing meds and vit D preparations, use low Ca dialysate; adj Ca is 10.1, goal 8.5-9.5 5. Get  phos to 2.5-4.5 range; phos is too low now, holding binders, giving Kphos, will keep K up as well so we can use low Ca bath 6. Avoid IV Fe 7. Consider discontinuation of coumadin: this depends on risk/benefit ratio, would ask cardiologist to see regarding the reason for coumadin and risks of stopping. Recommendation if risk is high is to treat calciphylaxis with other measures and if not improving then withdraw coumadin if at all possible.  8. Use epo and transfusion to keep Hb between 9.5-10.5 9. Oxygen Rx (ordered) 10. Aggressive wound care program and pain control                   With this program in a case series, 6 of 7 patients with severe calciphylaxis recovered.  2. ESRD: extra HD as above, K 4.5. 3. Chronic hypotension/volume: below dry wt, moderate tissue edema, cardiomyopathy. Attempting to remove volume gradually as tolerated with UF 1-1.5kg each HD for now. 4. Anemia/CKD:  Hgb 9.4 on Aranesp 200 mcg on Thurs, no IV Fe due to #1, transfuse to keep Hb 9.5-10.5. 5. NICM / bivent HF / LVEF 25% / AICD 6. Pacemaker 7. Morbid obesity     LOS: 8 days   Adam [name],Adam [name] 02/11/2013,11:12 AM   I have seen and examined patient, discussed with PA and agree with assessment and plan as outlined  above. Vinson Moselleob Talib Headley MD pager [phone]    cell [phone] 02/11/2013, 1:04 PM

## 2013-02-11 NOTE — Procedures (Signed)
I was present at this dialysis session, have reviewed the session itself and made  appropriate changes  Vinson Moselle MD (pgr) [phone]    ([phone] 02/11/2013, 1:05 PM

## 2013-02-11 NOTE — Consult Note (Signed)
Physical Medicine and Rehabilitation Consult Reason for Consult: Deconditioning/sepsis/renal failure Referring Physician: Internal medicine   HPI: Adam [name] is a 48 y.o. right-handed male with end-stage renal disease hemodialysis Tuesday Thursday Saturday, systolic congestive heart failure with ejection fraction 25%, atrial fibrillation on chronic Coumadin as well as chronic lower extremity wounds. Admitted 02/03/2013 with left leg pain and penile discharge after small laceration that required sutures. Findings of elevated WBC of low-grade fever 100.3 with orthostatic blood pressure. 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 Positive MRSA nasal swab. Underwent circumcision and debridement of the penile shaft skin 02/04/2013. Wound care nurse for chronic bilateral lower extremity venous stasis/ erosion ulcers. Hemodialysis ongoing as advised. Physical and occupational therapy evaluation completed 02/10/2013 with recommendations for physical medicine rehabilitation consult.   Review of Systems  Respiratory: Positive for shortness of breath.   Cardiovascular: Positive for palpitations and leg swelling.  Genitourinary: Positive for dysuria.  Musculoskeletal: Positive for joint pain and myalgias.  Psychiatric/Behavioral: The patient has insomnia.   All other systems reviewed and are negative.   Past Medical History  Diagnosis Date  . Other and unspecified hyperlipidemia   . Insomnia, unspecified   . Obstructive sleep apnea (adult) (pediatric)   . Allergic rhinitis, cause unspecified   . Nonischemic cardiomyopathy     s/p St. Jude ICD  . Unspecified essential hypertension   . Type II or unspecified type diabetes mellitus without mention of complication, not stated as uncontrolled   . CKD (chronic kidney disease) stage 3, GFR 30-59 ml/min   . Esophageal reflux   . Morbid obesity   . Dysuria   . Tobacco use disorder   . Dysrhythmia   .  Pacemaker   . Antral ulcer   . Renal azotemia   . Arthritis     Gout w/hyperuricemia  . Morbid obesity   . Shortness of breath   . Automatic implantable cardioverter-defibrillator in situ    Past Surgical History  Procedure Laterality Date  . Cardiac defibrillator placement  2011    St. Jude  . Esophagogastroduodenoscopy  01/03/2011    Procedure: ESOPHAGOGASTRODUODENOSCOPY (EGD);  Surgeon: Vertell Novak., MD;  Location: [facility] ENDOSCOPY;  Service: Endoscopy;  Laterality: N/A;  . Av fistula placement Right 08/31/2012    Procedure: ARTERIOVENOUS (AV) FISTULA CREATION;  Surgeon: Chuck Hint, MD;  Location: [facility] OR;  Service: Vascular;  Laterality: Right;  . Insertion of dialysis catheter Right 08/31/2012    Procedure: INSERTION OF DIALYSIS CATHETER-Right Internal Jugular Placement;  Surgeon: Chuck Hint, MD;  Location: [facility] OR;  Service: Vascular;  Laterality: Right;  . Circumcision N/A 02/04/2013    Procedure: CIRCUMCISION ADULT;  Surgeon: Kathi Ludwig, MD;  Location: [facility] OR;  Service: Urology;  Laterality: N/A;  . Irrigation and debridement abscess N/A 02/04/2013    Procedure: PENILE DEBRIDEMENT ;  Surgeon: Kathi Ludwig, MD;  Location: [facility] OR;  Service: Urology;  Laterality: N/A;  . Urethrotomy N/A 02/04/2013    Procedure: CYSTOSCOPY/URETHROTOMY;  Surgeon: Kathi Ludwig, MD;  Location: [facility] OR;  Service: Urology;  Laterality: N/A;  . Circumcision N/A 02/04/2013    Procedure: CIRCUMCISION ADULT;  Surgeon: Henrene Dodge, MD;  Location: [facility] OR;  Service: Urology;  Laterality: N/A;  . Groin debridement N/A 02/04/2013    Procedure: Penile DEBRIDEMENT;  Surgeon: Henrene Dodge, MD;  Location: [facility] OR;  Service: Urology;  Laterality: N/A;   Family History  Problem Relation Age of Onset  .  Diabetes Mother   . Microcephaly Father   . Lung cancer Father    Social History:  reports that he has been smoking Cigarettes.  He has been smoking about 0.25 packs per day.  He has never used smokeless tobacco. He reports that he does not drink alcohol or use illicit drugs. Allergies:  Allergies  Allergen Reactions  . Argatroban Other (See Comments)    Ventricular tachycardia  . Ace Inhibitors     Acute renal failure with multiple trials  . Heparin     Thrombcytopenia. SRA positive. See miscellaneous test (SRA results)   Medications Prior to Admission  Medication Sig Dispense Refill  . acetaminophen (TYLENOL) 500 MG tablet Take 500 mg by mouth every 6 (six) hours as needed.      Marland Kitchen amiodarone (PACERONE) 200 MG tablet Take 200 mg by mouth daily.      . colchicine 0.6 MG tablet Take 1 tablet (0.6 mg total) by mouth See admin instructions. Takes every three days.  15 tablet  2  . diclofenac sodium (VOLTAREN) 1 % GEL Apply 2 g topically 4 (four) times daily.  1 Tube  2  . esomeprazole (NEXIUM) 20 MG capsule Take 20 mg by mouth daily at 12 noon. TAKE ONE CAPSULE EVERY DAY BEFORE BREAKFAST      . Febuxostat (ULORIC) 80 MG TABS Take 1 tablet (80 mg total) by mouth daily.  30 tablet  5  . fluticasone (FLONASE) 50 MCG/ACT nasal spray Place 1 spray into both nostrils daily as needed for allergies. For allergies  16 g  2  . Insulin Glargine (LANTUS SOLOSTAR) 100 UNIT/ML Solostar Pen Inject 45 Units into the skin daily at 10 pm.       . loratadine (CLARITIN) 10 MG tablet Take 10 mg by mouth daily as needed for allergies.      Marland Kitchen LORazepam (ATIVAN) 0.5 MG tablet Take 0.5 mg by mouth daily. TAKE 1 TABLET TWICE A DAY AS NEEDED      . traMADol (ULTRAM) 50 MG tablet Take 100 mg by mouth 2 (two) times daily.      Marland Kitchen warfarin (COUMADIN) 2.5 MG tablet Take 2.5 mg by mouth daily. Takes 1 and half tab every day except on Monday. Monday takes just 2.5 mg.        Home: Home Living Family/patient expects to be discharged to:: Private residence Living Arrangements: Spouse/significant other Available Help at Discharge: Family;Friend(s);Available 24 hours/day Type of Home:  Apartment Home Access: Level entry Home Layout: One level Home Equipment: Walker - 2 wheels;Cane - single point;Bedside commode;Shower seat Additional Comments: Pt has old walker, needs new walker and wheelchair  Functional History: Prior Function Comments: used cane PTA; Had dialysis T,TH, Sat Functional Status:  Mobility:          ADL: ADL Grooming: Performed;Wash/dry hands;Wash/dry face;Supervision/safety;Set up Where Assessed - Grooming: Unsupported sitting Upper Body Bathing: Simulated;Supervision/safety;Set up Where Assessed - Upper Body Bathing: Unsupported sitting Lower Body Bathing: Simulated;Maximal assistance Upper Body Dressing: Performed;Supervision/safety;Set up Where Assessed - Upper Body Dressing: Unsupported sitting Lower Body Dressing: +1 Total assistance Transfers/Ambulation Related to ADLs: Pt declined sit - stand at EOB and transfer to recliner due to L LE pain 8.5/10  Cognition: Cognition Overall Cognitive Status: Within Functional Limits for tasks assessed Orientation Level: Oriented X4 Cognition Arousal/Alertness: Awake/alert Behavior During Therapy: WFL for tasks assessed/performed Overall Cognitive Status: Within Functional Limits for tasks assessed  Blood pressure 90/67, pulse 112, temperature 98.1 F (36.7 C), temperature source  Oral, resp. rate 20, height 6' (1.829 m), weight 142.9 kg (315 lb 0.6 oz), SpO2 99.00%. Physical Exam  Constitutional: He is oriented to person, place, and time.  Morbidly obese  HENT:  Head: Normocephalic.  Eyes: EOM are normal.  Neck: Normal range of motion. Neck supple. No thyromegaly present.  Cardiovascular: Regular rhythm and normal heart sounds.   Cardiac rate controlled  Respiratory: Effort normal and breath sounds normal.  GI: Soft. Bowel sounds are normal. He exhibits no distension.  obese  Musculoskeletal:  RLE 2++ edema, chronic stasis changes. LLE 1+.   Neurological: He is alert and oriented to  person, place, and time.  UE's grossly 4+. LE's 1+ to 2/5 HF, 2/5 KE, 3+ to 4/5 ADF and APF. Mild sensory changes in feet. Reasonable insight and awareness  Skin:  Multiple skin lesions lower extremities with dressing in place  Psychiatric:  Pretty flat. Not engaged.     Results for orders placed during the hospital encounter of 02/03/13 (from the past 24 hour(s))  GLUCOSE, CAPILLARY     Status: Abnormal   Collection Time    02/10/13  1:44 PM      Result Value Range   Glucose-Capillary 107 (*) 70 - 99 mg/dL  GLUCOSE, CAPILLARY     Status: Abnormal   Collection Time    02/10/13  5:09 PM      Result Value Range   Glucose-Capillary 135 (*) 70 - 99 mg/dL   Comment 1 Notify RN    GLUCOSE, CAPILLARY     Status: Abnormal   Collection Time    02/10/13  8:40 PM      Result Value Range   Glucose-Capillary 121 (*) 70 - 99 mg/dL  PROTIME-INR     Status: Abnormal   Collection Time    02/11/13  5:16 AM      Result Value Range   Prothrombin Time 32.5 (*) 11.6 - 15.2 seconds   INR 3.32 (*) 0.00 - 1.49  BASIC METABOLIC PANEL     Status: Abnormal   Collection Time    02/11/13  5:16 AM      Result Value Range   Sodium 140  137 - 147 mEq/L   Potassium 4.5  3.7 - 5.3 mEq/L   Chloride 92 (*) 96 - 112 mEq/L   CO2 21  19 - 32 mEq/L   Glucose, Bld 85  70 - 99 mg/dL   BUN 15  6 - 23 mg/dL   Creatinine, Ser 8.653.14 (*) 0.50 - 1.35 mg/dL   Calcium 8.4  8.4 - 78.410.5 mg/dL   GFR calc non Af Amer 22 (*) >90 mL/min   GFR calc Af Amer 25 (*) >90 mL/min  GLUCOSE, CAPILLARY     Status: None   Collection Time    02/11/13  5:53 AM      Result Value Range   Glucose-Capillary 98  70 - 99 mg/dL   No results found.  Assessment/Plan: Diagnosis: deconditioning related to necrotizing fasciitis of penis, leg ulcers, multiple medical 1. Does the need for close, 24 hr/day medical supervision in concert with the patient's rehab needs make it unreasonable for this patient to be served in a less intensive setting?  Yes and Potentially 2. Co-Morbidities requiring supervision/potential complications: ESRD, afib, chf/cm, dm2 3. Due to bladder management, bowel management, safety, skin/wound care, disease management, medication administration, pain management and patient education, does the patient require 24 hr/day rehab nursing? Yes and Potentially 4. Does the patient require coordinated  care of a physician, rehab nurse, PT (1-2 hrs/day, 5 days/week) and OT (1-2 hrs/day, 5 days/week) to address physical and functional deficits in the context of the above medical diagnosis(es)? Yes Addressing deficits in the following areas: balance, endurance, locomotion, strength, transferring, bowel/bladder control, bathing, dressing, feeding, grooming, toileting and psychosocial support 5. Can the patient actively participate in an intensive therapy program of at least 3 hrs of therapy per day at least 5 days per week? Potentially/yes 6. The potential for patient to make measurable gains while on inpatient rehab is good 7. Anticipated functional outcomes upon discharge from inpatient rehab are supervision with PT, supervision to min assist with OT, n/a with SLP. 8. Estimated rehab length of stay to reach the above functional goals is: 9-12 days 9. Does the patient have adequate social supports to accommodate these discharge functional goals? Yes and Potentially 10. Anticipated D/C setting: Home 11. Anticipated post D/C treatments: HH therapy 12. Overall Rehab/Functional Prognosis: good  RECOMMENDATIONS: This patient's condition is appropriate for continued rehabilitative care in the following setting: CIR Patient has agreed to participate in recommended program. Yes and Potentially Note that insurance prior authorization may be required for reimbursement for recommended care.  Comment: Rehab Admissions Coordinator to follow up. Need to see participation and progress with therapy.  Thanks,  Ranelle Oyster, MD,  [place]     02/11/2013

## 2013-02-11 NOTE — Progress Notes (Addendum)
INITIAL NUTRITION ASSESSMENT  DOCUMENTATION CODES Per approved criteria  -Morbid Obesity   INTERVENTION: Add Resource Breeze po BID, each supplement provides 250 kcal and 9 grams of protein. Add 30 ml Prostat po BID, each supplement provides 100 kcal and 15 grams protein. Encouraged high kcal/high protein intake with frequent HD treatments. Agree with Regular diet liberalization. RD to continue to follow nutrition care plan.  NUTRITION DIAGNOSIS: Increased nutrient needs related to wound healing and HD as evidenced by estimated needs.   Goal: Intake to meet >90% of estimated nutrition needs.  Monitor:  weight trends, lab trends, I/O's, PO intake, supplement tolerance  Reason for Assessment: Malnutrition Screening Tool  48 y.o. male  Admitting Dx: Calciphylaxis  ASSESSMENT: PMHx significant for ESRD on HD (TTS), CHF, DM2, a-fib. Admitted with penis pain and discharge x 2 weeks; pt also with chronic leg swelling and L foot ulcer. Work-up reveals calciphylaxis.  WOC RN saw pt 1/8 - pt with partial thickness and full thickness skin ulcers of the inner L thigh and L dorsal foot. Pt noted to have calcification of penile vessels on pelvic CT.  Underwent circumcision and debridement of penile shaft skin on 1/9.  WOC RN re-evaluated pt on 1/13 and pt founds to have more extensive necrosis of the glans/frenulum today (it is hard and yellow/black) dry necrosis of the penile shaft and some necrosis noted on the scrotal sac.  Per renal team, calciphylaxis treatment includes aggressive HD x 7 days (today is day 5 of 7) and then 5-6 x week after that, as well as several medication adjustments (discontinuing all Vit D and calcium supplementation, keeping Hgb stable, etc.)  MD liberalized pt's diet to Regular on 1/14. Pt eating fair, consuming about 50% of meals at this time. Reviewed importance of nutrition and encouraged adequate kcal/protein intake. Discussed high protein foods.  Potassium  is WNL. Phosphorus is low at 1.6.  Height: Ht Readings from Last 1 Encounters:  02/05/13 6' (1.829 m)    Weight: Wt Readings from Last 1 Encounters:  02/11/13 310 lb 13.6 oz (141 kg)    Ideal Body Weight: 178 lb  % Ideal Body Weight: 174%  Wt Readings from Last 10 Encounters:  02/11/13 310 lb 13.6 oz (141 kg)  02/11/13 310 lb 13.6 oz (141 kg)  01/10/13 323 lb 4.8 oz (146.648 kg)  12/31/12 318 lb (144.244 kg)  12/15/12 316 lb (143.337 kg)  12/13/12 316 lb (143.337 kg)  11/19/12 314 lb 9.6 oz (142.702 kg)  10/25/12 314 lb 12.8 oz (142.792 kg)  10/22/12 313 lb 4.8 oz (142.112 kg)  10/13/12 316 lb 1.9 oz (143.391 kg)   EDW: 143.5 kg  Usual Body Weight: 143.5 kg  % Usual Body Weight: 98%  BMI:  Body mass index is 42.15 kg/(m^2). Obese Class III  Estimated Nutritional Needs: Kcal: 2400 - 2600 Protein: 150 - 170 g Fluid: 1.2 liters  Skin:  stage II to R buttocks partial thickness and full thickness skin ulcers of the inner L thigh and L dorsal foot  Diet Order: General  EDUCATION NEEDS: -Education needs addressed   Intake/Output Summary (Last 24 hours) at 02/11/13 1334 Last data filed at 02/11/13 1220  Gross per 24 hour  Intake    170 ml  Output   2018 ml  Net  -1848 ml    Last BM: 1/16  Labs:   Recent Labs Lab 02/05/13 0430  02/08/13 0930 02/09/13 0253 02/10/13 0510 02/11/13 0516  NA 135*  < >  136* 137 140 140  K 3.6*  < > 3.4* 3.6* 4.4 4.5  CL 93*  < > 95* 93* 94* 92*  CO2 27  < > 25 26 22 21   BUN 15  < > 20 14 14 15   CREATININE 3.32*  < > 3.72* 3.31* 3.13* 3.14*  CALCIUM 8.4  < > 8.2* 8.3* 8.4 8.4  MG 1.8  --   --   --   --   --   PHOS 3.6  < > 2.6 1.5* 1.6*  --   GLUCOSE 117*  < > 78 96 63* 85  < > = values in this interval not displayed.  CBG (last 3)   Recent Labs  02/10/13 2040 02/11/13 0553 02/11/13 1304  GLUCAP 121* 98 88    Scheduled Meds: . amiodarone  200 mg Oral BID  . colchicine  0.6 mg Oral Q3 days  . darbepoetin  (ARANESP) injection - DIALYSIS  200 mcg Intravenous Q Thu-HD  . diclofenac sodium  2 g Topical QID  . Febuxostat  1 tablet Oral Daily  . insulin aspart  0-9 Units Subcutaneous TID WC  . insulin glargine  28 Units Subcutaneous QHS  . LORazepam  0.5 mg Oral Daily  . midodrine  10 mg Oral BID WC  . multivitamin  1 tablet Oral QHS  . pantoprazole  40 mg Oral QAC breakfast  . phosphorus  250 mg Oral BID  . sodium chloride 0.9 % 100 mL with sodium thiosulfate 25 g  25 g Intravenous Q1200  . traMADol  100 mg Oral BID  . Warfarin - Pharmacist Dosing Inpatient   Does not apply q1800    Continuous Infusions:   Past Medical History  Diagnosis Date  . Other and unspecified hyperlipidemia   . Insomnia, unspecified   . Obstructive sleep apnea (adult) (pediatric)   . Allergic rhinitis, cause unspecified   . Nonischemic cardiomyopathy     a. s/p St. Jude ICD 2011. b. Normal cors 2006. c. EF 25% in 12/2011.  Marland Kitchen Unspecified essential hypertension   . Type II or unspecified type diabetes mellitus without mention of complication, not stated as uncontrolled   . CKD (chronic kidney disease) stage 3, GFR 30-59 ml/min   . Esophageal reflux   . Morbid obesity   . Dysuria   . Tobacco use disorder   . Antral ulcer   . Renal azotemia   . Arthritis     Gout w/hyperuricemia  . Morbid obesity   . Automatic implantable cardioverter-defibrillator in situ   . Calciphylaxis   . PAF (paroxysmal atrial fibrillation)     a. Dx 08/2012.  . Paroxysmal atrial flutter     a. Dx 08/2012.  . Paroxysmal VT     a. 08/2012 in setting of long QT, argatroban  . Chronic combined systolic and diastolic CHF (congestive heart failure)     a. Due to NICM.    Past Surgical History  Procedure Laterality Date  . Cardiac defibrillator placement  2011    St. Jude  . Esophagogastroduodenoscopy  01/03/2011    Procedure: ESOPHAGOGASTRODUODENOSCOPY (EGD);  Surgeon: Vertell Novak., MD;  Location: [facility] ENDOSCOPY;  Service:  Endoscopy;  Laterality: N/A;  . Av fistula placement Right 08/31/2012    Procedure: ARTERIOVENOUS (AV) FISTULA CREATION;  Surgeon: Chuck Hint, MD;  Location: [facility] OR;  Service: Vascular;  Laterality: Right;  . Insertion of dialysis catheter Right 08/31/2012    Procedure: INSERTION OF DIALYSIS CATHETER-Right Internal  Jugular Placement;  Surgeon: Chuck Hint, MD;  Location: [facility] OR;  Service: Vascular;  Laterality: Right;  . Circumcision N/A 02/04/2013    Procedure: CIRCUMCISION ADULT;  Surgeon: Kathi Ludwig, MD;  Location: [facility] OR;  Service: Urology;  Laterality: N/A;  . Irrigation and debridement abscess N/A 02/04/2013    Procedure: PENILE DEBRIDEMENT ;  Surgeon: Kathi Ludwig, MD;  Location: [facility] OR;  Service: Urology;  Laterality: N/A;  . Urethrotomy N/A 02/04/2013    Procedure: CYSTOSCOPY/URETHROTOMY;  Surgeon: Kathi Ludwig, MD;  Location: [facility] OR;  Service: Urology;  Laterality: N/A;  . Circumcision N/A 02/04/2013    Procedure: CIRCUMCISION ADULT;  Surgeon: Henrene Dodge, MD;  Location: [facility] OR;  Service: Urology;  Laterality: N/A;  . Groin debridement N/A 02/04/2013    Procedure: Penile DEBRIDEMENT;  Surgeon: Henrene Dodge, MD;  Location: [facility] OR;  Service: Urology;  Laterality: N/A;    Jarold Motto MS, RD, LDN Pager: [phone] After-hours pager: [phone]

## 2013-02-12 DIAGNOSIS — Z9229 Personal history of other drug therapy: Secondary | ICD-10-CM

## 2013-02-12 LAB — GLUCOSE, CAPILLARY
Glucose-Capillary: 66 mg/dL — ABNORMAL LOW (ref 70–99)
Glucose-Capillary: 129 mg/dL — ABNORMAL HIGH (ref 70–99)
Glucose-Capillary: 124 mg/dL — ABNORMAL HIGH (ref 70–99)
Glucose-Capillary: 174 mg/dL — ABNORMAL HIGH (ref 70–99)
Glucose-Capillary: 218 mg/dL — ABNORMAL HIGH (ref 70–99)

## 2013-02-12 LAB — PROTIME-INR
INR: 3.37 — AB (ref 0.00–1.49)
PROTHROMBIN TIME: 32.9 s — AB (ref 11.6–15.2)

## 2013-02-12 LAB — RENAL FUNCTION PANEL
Albumin: 1.8 g/dL — ABNORMAL LOW (ref 3.5–5.2)
BUN: 18 mg/dL (ref 6–23)
CALCIUM: 8.3 mg/dL — AB (ref 8.4–10.5)
CHLORIDE: 91 meq/L — AB (ref 96–112)
CO2: 22 mEq/L (ref 19–32)
CREATININE: 3.28 mg/dL — AB (ref 0.50–1.35)
GFR, EST AFRICAN AMERICAN: 24 mL/min — AB (ref >90)
GFR, EST NON AFRICAN AMERICAN: 21 mL/min — AB (ref >90)
GLUCOSE: 123 mg/dL — AB (ref 70–99)
Phosphorus: 4.5 mg/dL (ref 2.3–4.6)
Potassium: 3.8 mEq/L (ref 3.7–5.3)
Sodium: 139 mEq/L (ref 137–147)

## 2013-02-12 MED ORDER — LIDOCAINE HCL (PF) 1 % IJ SOLN: 5.0000 mL | INTRAMUSCULAR | Status: DC | PRN

## 2013-02-12 MED ORDER — ALTEPLASE 2 MG IJ SOLR: 2.0000 mg | Freq: Once | INTRAMUSCULAR | Status: DC | PRN

## 2013-02-12 MED ORDER — NEPRO/CARBSTEADY PO LIQD: 237.0000 mL | ORAL | Status: DC | PRN

## 2013-02-12 MED ORDER — ALTEPLASE 2 MG IJ SOLR
2.0000 mg | Freq: Once | INTRAMUSCULAR | Status: AC | PRN
  Filled 2013-02-12: qty 2

## 2013-02-12 MED ORDER — LIDOCAINE-PRILOCAINE 2.5-2.5 % EX CREA: 1.0000 "application " | TOPICAL_CREAM | CUTANEOUS | Status: DC | PRN

## 2013-02-12 MED ORDER — PENTAFLUOROPROP-TETRAFLUOROETH EX AERO: 1.0000 "application " | INHALATION_SPRAY | CUTANEOUS | Status: DC | PRN

## 2013-02-12 MED ORDER — SODIUM CHLORIDE 0.9 % IV SOLN: 100.0000 mL | INTRAVENOUS | Status: DC | PRN

## 2013-02-12 MED ORDER — HEPARIN SODIUM (PORCINE) 1000 UNIT/ML DIALYSIS: 1000.0000 [IU] | INTRAMUSCULAR | Status: DC | PRN

## 2013-02-12 MED ORDER — NEPRO/CARBSTEADY PO LIQD
237.0000 mL | ORAL | Status: DC | PRN
  Filled 2013-02-12: qty 237

## 2013-02-12 MED ORDER — POTASSIUM CHLORIDE CRYS ER 20 MEQ PO TBCR
20.0000 meq | EXTENDED_RELEASE_TABLET | Freq: Two times a day (BID) | ORAL | Status: DC
Start: 2013-02-12 — End: 2013-02-16
  Administered 2013-02-12 – 2013-02-16 (×8): 20 meq via ORAL
  Filled 2013-02-12 (×10): qty 1

## 2013-02-12 NOTE — Progress Notes (Signed)
Wound care performed on leg, feet, penis, and scrotum area per wound care orders. Applied oxygen at 15LPM for 2 hours. Scheduled medication given. At this time patient complains of no pain or discomfort. Will continue to monitor patient to end of shift.

## 2013-02-12 NOTE — Progress Notes (Signed)
Primary cardiologist: Dr. Dietrich PatesPaula [name]  Subjective:    Patient sitting up, feels better with improving wounds.  Objective:   Temp:  [97.9 F (36.6 C)-98.2 F (36.8 C)] 98.2 F (36.8 C) (01/17 0433) Pulse Rate:  [99-113] 103 (01/17 0433) Resp:  [14-19] 18 (01/17 0433) BP: (87-104)/(57-66) 94/57 mmHg (01/17 0433) SpO2:  [94 %-98 %] 98 % (01/17 0433) Weight:  [310 lb 13.6 oz (141 kg)-317 lb 14.5 oz (144.2 kg)] 317 lb 14.5 oz (144.2 kg) (01/17 0433) Last BM Date: 02/11/13  Filed Weights   02/11/13 0800 02/11/13 1220 02/12/13 0433  Weight: 315 lb 4.1 oz (143 kg) 310 lb 13.6 oz (141 kg) 317 lb 14.5 oz (144.2 kg)    Intake/Output Summary (Last 24 hours) at 02/12/13 1215 Last data filed at 02/12/13 0900  Gross per 24 hour  Intake    840 ml  Output   2018 ml  Net  -1178 ml    Exam:  General: Obese, no distress.  Lungs: Decreased breath sounds but clear.  Cardiac: Irregular, no gallop.  Abdomen: Protuberant.  Extremities: Thickened skin and nodularity.   Lab Results:  Basic Metabolic Panel:  Recent Labs Lab 02/10/13 0510 02/11/13 0516 02/12/13 0932  NA 140 140 139  K 4.4 4.5 3.8  CL 94* 92* 91*  CO2 22 21 22   GLUCOSE 63* 85 123*  BUN 14 15 18   CREATININE 3.13* 3.14* 3.28*  CALCIUM 8.4 8.4 8.3*    Liver Function Tests:  Recent Labs Lab 02/09/13 0253 02/10/13 0510 02/12/13 0932  ALBUMIN 1.8* 1.8* 1.8*    CBC:  Recent Labs Lab 02/07/13 0400 02/08/13 0440 02/11/13 1352  WBC 13.1* 11.1* 10.8*  HGB 7.7* 9.4* 9.7*  HCT 24.6* 29.4* 29.6*  MCV 89.5 88.6 88.9  PLT 407* 368 329    Coagulation:  Recent Labs Lab 02/10/13 0510 02/11/13 0516 02/12/13 0436  INR 2.80* 3.32* 3.37*     Medications:   Scheduled Medications: . amiodarone  200 mg Oral BID  . colchicine  0.6 mg Oral Q3 days  . darbepoetin (ARANESP) injection - DIALYSIS  200 mcg Intravenous Q Thu-HD  . diclofenac sodium  2 g Topical QID  . Febuxostat  1 tablet Oral Daily  .  feeding supplement (PRO-STAT SUGAR FREE 64)  30 mL Oral BID WC  . feeding supplement (RESOURCE BREEZE)  1 Container Oral BID BM  . insulin aspart  0-9 Units Subcutaneous TID WC  . insulin glargine  28 Units Subcutaneous QHS  . LORazepam  0.5 mg Oral Daily  . midodrine  10 mg Oral BID WC  . multivitamin  1 tablet Oral QHS  . pantoprazole  40 mg Oral QAC breakfast  . potassium chloride  20 mEq Oral BID  . sodium chloride 0.9 % 100 mL with sodium thiosulfate 25 g  25 g Intravenous Q1200  . traMADol  100 mg Oral BID  . Warfarin - Pharmacist Dosing Inpatient   Does not apply q1800    PRN Medications: sodium chloride, sodium chloride, acetaminophen, feeding supplement (NEPRO CARB STEADY), fluticasone, HYDROcodone-acetaminophen, lidocaine (PF), lidocaine-prilocaine, loratadine, pentafluoroprop-tetrafluoroeth, white petrolatum   Assessment:   1. 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 Myra GianottiBrabham this admission   2. 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 UTI, UCx multiple morphs   4. Paroxysmal atrial flutter/atrial fibrillation dx 08/2012   5. Paroxysmal VT 08/2012 in setting of long QT/argatroban,  not on BB due to hypotension with dialysis   6. Chronic combined systolic and diastolic CHF/NICM EF 25% in 12/2011, [employer] AICD placed 2011, cath 2006 normal coronaries, not on ACEI due to hypotension   7. ESRD on HD since August   8. Severe hypoalbuminemia 1.9   9. Acute on chronic anemia - admit 6.7, s/p transfusion   10. Morbid obesity Body mass index is 42.15 kg/(m^2).   11. Probable depression   Plan/Discussion:    I reviewed the recent notes by Dr. Myrtis Ser and Dr. Delton See. Options for anticoagulation for management of paroxysmal to persistent atrial fibrillation (CHADSVASC 3) limited essentially to Coumadin as already outlined. Did not tolerate argatroban previously, and not a candidate for NOAC due to renal  failure on hemodialysis. The question then is whether it is clearly felt that continuing on Coumadin is definitely leading to worsening in the patient's calciphylaxis. I spoke with the pharmacist today, and while Coumadin can be associated with worsening calciphylaxis, the actual risk of this occurring appears to be low. I do not follow this patient long-term, rounding on the weekend. My recommendation would be for Nephrology to discuss this risk with pharmacy and the patient's primary cardiologist Dr. Tenny Craw in helping to decide whether Coumadin needs to be stopped altogether. With him improving clinically with other measures, it seems to me that it is not necessarily contraindicated for him to continue on Coumadin, particularly if we have no other options.  Adam [name], M.D., F.A.C.C.

## 2013-02-12 NOTE — Progress Notes (Signed)
8 Days Post-Op  Subjective:  1- Penile Necrosis / Wound - s/p operative debridement of penile wound and circumcision 02/04/13. CX polymicrobial. He makes very little urine at baseline, small void usually every other day or so.  Today Adam [name] is stable. His glans is numb. No fevers. His leg wound is feeling better.   Objective: Vital signs in last 24 hours: Temp:  [97.9 F (36.6 C)-98.2 F (36.8 C)] 98.2 F (36.8 C) (01/17 0433) Pulse Rate:  [83-117] 103 (01/17 0433) Resp:  [14-19] 18 (01/17 0433) BP: (84-126)/(57-73) 94/57 mmHg (01/17 0433) SpO2:  [94 %-98 %] 98 % (01/17 0433) Weight:  [141 kg (310 lb 13.6 oz)-144.2 kg (317 lb 14.5 oz)] 144.2 kg (317 lb 14.5 oz) (01/17 0433) Last BM Date: 02/11/13  Intake/Output from previous day: 01/16 0701 - 01/17 0700 In: 480 [P.O.:480] Out: 2018  Intake/Output this shift:    General appearance: alert, cooperative and appears stated age Head: Normocephalic, without obvious abnormality, atraumatic Eyes: conjunctivae/corneas clear. PERRL, EOM's intact. Fundi benign. Ears: normal TM's and external ear canals both ears GU: penile wound stable and improved somewhat. All wound edges appear viable. Glans somewhat softer, appears viable. EXT: LLE wound stable, slight decrease is woody edema  Lab Results:   Recent Labs  02/11/13 1352  WBC 10.8*  HGB 9.7*  HCT 29.6*  PLT 329   BMET  Recent Labs  02/10/13 0510 02/11/13 0516  NA 140 140  K 4.4 4.5  CL 94* 92*  CO2 22 21  GLUCOSE 63* 85  BUN 14 15  CREATININE 3.13* 3.14*  CALCIUM 8.4 8.4   PT/INR  Recent Labs  02/11/13 0516 02/12/13 0436  LABPROT 32.5* 32.9*  INR 3.32* 3.37*   ABG No results found for this basename: PHART, PCO2, PO2, HCO3,  in the last 72 hours  Studies/Results: No results found.  Anti-infectives: Anti-infectives   Start     Dose/Rate Route Frequency Ordered Stop   02/09/13 1400  piperacillin-tazobactam (ZOSYN) IVPB 2.25 g  Status:  Discontinued     2.25 g 100 mL/hr over 30 Minutes Intravenous 3 times per day 02/09/13 1049 02/11/13 0841   02/08/13 1200  vancomycin (VANCOCIN) IVPB 1000 mg/200 mL premix  Status:  Discontinued     1,000 mg 200 mL/hr over 60 Minutes Intravenous Every T-Th-Sa (Hemodialysis) 02/06/13 1039 02/07/13 1100   02/07/13 1415  piperacillin-tazobactam (ZOSYN) IVPB 3.375 g  Status:  Discontinued     3.375 g 12.5 mL/hr over 240 Minutes Intravenous 3 times per day 02/07/13 1407 02/09/13 1049   02/06/13 1800  vancomycin (VANCOCIN) 1,750 mg in sodium chloride 0.9 % 500 mL IVPB  Status:  Discontinued     1,750 mg 250 mL/hr over 120 Minutes Intravenous Every 24 hours 02/05/13 1746 02/06/13 1039   02/06/13 1400  piperacillin-tazobactam (ZOSYN) IVPB 2.25 g  Status:  Discontinued     2.25 g 100 mL/hr over 30 Minutes Intravenous 3 times per day 02/06/13 1039 02/07/13 1407   02/06/13 0500  piperacillin-tazobactam (ZOSYN) IVPB 3.375 g  Status:  Discontinued     3.375 g 12.5 mL/hr over 240 Minutes Intravenous 3 times per day 02/05/13 1745 02/06/13 1039   02/05/13 1830  vancomycin (VANCOCIN) 2,500 mg in sodium chloride 0.9 % 500 mL IVPB     2,500 mg 250 mL/hr over 120 Minutes Intravenous  Once 02/05/13 1744 02/05/13 2049   02/04/13 2145  ceFAZolin (ANCEF) 3 g in dextrose 5 % 50 mL IVPB  Status:  Discontinued  3 g 160 mL/hr over 30 Minutes Intravenous  Once 02/04/13 2134 02/05/13 1723   02/03/13 1200  fluconazole (DIFLUCAN) tablet 150 mg     150 mg Oral  Once 02/03/13 1123 02/03/13 2146   02/03/13 1130  amoxicillin-clavulanate (AUGMENTIN) 500-125 MG per tablet 500 mg  Status:  Discontinued     1 tablet Oral 3 times daily 02/03/13 1123 02/05/13 1723   02/03/13 0615  vancomycin (VANCOCIN) IVPB 1000 mg/200 mL premix     1,000 mg 200 mL/hr over 60 Minutes Intravenous  Once 02/03/13 0602 02/03/13 0729   02/03/13 0615  piperacillin-tazobactam (ZOSYN) IVPB 3.375 g     3.375 g 12.5 mL/hr over 240 Minutes Intravenous  Once 02/03/13  0602 02/03/13 1133      Assessment/Plan:  1- Penile Necrosis / Wound - no indications fur urgent re-operation at this point. Wound does appear to be improving somewhat. Pt delighted no more surgery there for now. His mood is improved somewhat compared to prior.   Will follow.   [facility] HospitalMANNY, [name] 02/12/2013

## 2013-02-12 NOTE — Progress Notes (Signed)
Subjective: Mr. Ropp is in better spirits this morning as Urology evaluated patient this morning and they do not have plans to take him back to the OR in the near future. His pain is well controlled and improved from yesterday. He has been working with PT and PMR evaluated yesterday and agrees with possible CIR. Pt reports he feels better when he gets up and moves around and is amenable to working with PT.   Objective: Vital signs in last 24 hours: Filed Vitals:   02/11/13 1220 02/11/13 1307 02/11/13 2123 02/12/13 0433  BP: 87/60 104/62 99/66 94/57   Pulse: 99 108 113 103  Temp: 97.9 F (36.6 C) 98.1 F (36.7 C) 98.2 F (36.8 C) 98.2 F (36.8 C)  TempSrc: Oral Oral Oral Oral  Resp: 14 19 18 18   Height:      Weight: 310 lb 13.6 oz (141 kg)   317 lb 14.5 oz (144.2 kg)  SpO2: 98% 96% 94% 98%   Weight change: 2 lb 3.3 oz (1 kg)  Intake/Output Summary (Last 24 hours) at 02/12/13 0905 Last data filed at 02/11/13 1700  Gross per 24 hour  Intake    480 ml  Output   2018 ml  Net  -1538 ml   Physical Exam General: lying in bed, NAD; good spirits this morning and feels like he is improving HEENT: [place]/AT, vision grossly intact Lungs: CTAB Heart: RRR, no m/g/r Abdomen: soft, obese, NT, +bs Extremities: induration and hyperpigmentation to thighs improving (induration marked with marker on 1/14), the erosion to L inner thigh continuing to break down with small amount of clear drainage; area of skin breakdown on R inner thigh is stable; loose dressings in place Neurologic: alert & oriented X3, moves all extremities spontaneously GU: circumscised penis mildly tender to palpation, glans is firm; dressing in place over penile shaft, dried up serous drainage on gauze, there is some serous drainage over penile shaft, no odor   Lab Results: Basic Metabolic Panel:  Recent Labs Lab 02/09/13 0253 02/10/13 0510 02/11/13 0516  NA 137 140 140  K 3.6* 4.4 4.5  CL 93* 94* 92*  CO2 26 22 21     GLUCOSE 96 63* 85  BUN 14 14 15   CREATININE 3.31* 3.13* 3.14*  CALCIUM 8.3* 8.4 8.4  PHOS 1.5* 1.6*  --    Liver Function Tests:  Recent Labs Lab 02/09/13 0253 02/10/13 0510  ALBUMIN 1.8* 1.8*   CBC:  Recent Labs Lab 02/08/13 0440 02/11/13 1352  WBC 11.1* 10.8*  HGB 9.4* 9.7*  HCT 29.4* 29.6*  MCV 88.6 88.9  PLT 368 329    CBG:  Recent Labs Lab 02/11/13 0553 02/11/13 1005 02/11/13 1304 02/11/13 1554 02/11/13 2122 02/12/13 0559  GLUCAP 98 66* 88 159* 196* 129*   Coagulation:  Recent Labs Lab 02/09/13 0253 02/10/13 0510 02/11/13 0516 02/12/13 0436  LABPROT 24.7* 28.5* 32.5* 32.9*  INR 2.32* 2.80* 3.32* 3.37*   Urine Drug Screen: Drugs of Abuse     Component Value Date/Time   LABOPIA NEG 03/17/2006 1455   COCAINSCRNUR NEG 03/17/2006 1455   LABBENZ NEG 03/17/2006 1455   AMPHETMU NEG 03/17/2006 1455    Micro Results: Recent Results (from the past 240 hour(s))  CULTURE, BLOOD (ROUTINE X 2)     Status: None   Collection Time    02/03/13  5:45 AM      Result Value Range Status   Specimen Description BLOOD ARM LEFT   Final   Special Requests  BOTTLES DRAWN AEROBIC AND ANAEROBIC 10CC   Final   Culture  Setup Time     Final   Value: 02/03/2013 13:55     Performed at [facility]   Culture     Final   Value: NO GROWTH 5 DAYS     Performed at [facility]   Report Status 02/09/2013 FINAL   Final  CULTURE, BLOOD (ROUTINE X 2)     Status: None   Collection Time    02/03/13  5:52 AM      Result Value Range Status   Specimen Description BLOOD HAND LEFT   Final   Special Requests BOTTLES DRAWN AEROBIC ONLY 8CC   Final   Culture  Setup Time     Final   Value: 02/03/2013 13:54     Performed at [facility]   Culture     Final   Value: NO GROWTH 5 DAYS     Performed at [facility]   Report Status 02/09/2013 FINAL   Final  MRSA PCR SCREENING     Status: Abnormal   Collection Time    02/03/13 12:07 PM      Result Value  Range Status   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 It is not     intended to diagnose MRSA     infection nor to guide or     monitor treatment for     MRSA infections.     RESULT CALLED TO, READ BACK BY AND VERIFIED WITH:     A. BRAKE RN 14:30 02/03/13 ([email or web address])  WOUND CULTURE     Status: None   Collection Time    02/04/13 10:38 AM      Result Value Range Status   Specimen Description WOUND PENIS   Final   Special Requests NONE   Final   Gram Stain     Final   Value: FEW WBC PRESENT,BOTH PMN AND MONONUCLEAR     RARE SQUAMOUS EPITHELIAL CELLS PRESENT     MODERATE GRAM NEGATIVE RODS     MODERATE GRAM POSITIVE COCCI IN PAIRS     Performed at [facility]   Culture     Final   Value: MULTIPLE ORGANISMS PRESENT, NONE PREDOMINANT     Note: NO STAPHYLOCOCCUS AUREUS ISOLATED NO GROUP A STREP (S.PYOGENES) ISOLATED     Performed at [facility]   Report Status 02/06/2013 FINAL   Final  URINE CULTURE     Status: None   Collection Time    02/06/13  6:11 AM      Result Value Range Status   Specimen Description URINE, CATHETERIZED   Final   Special Requests NONE   Final   Culture  Setup Time     Final   Value: 02/06/2013 18:02     Performed at [facility] Count     Final   Value: >=100,000 COLONIES/ML     Performed at [facility]   Culture     Final   Value: Multiple bacterial morphotypes present, none predominant. Suggest appropriate recollection if clinically indicated.     Performed at [facility]  Report Status 02/08/2013 FINAL   Final  GC/CHLAMYDIA PROBE AMP     Status: None   Collection Time    02/06/13  6:18 AM      Result Value Range Status   CT Probe RNA NEGATIVE  NEGATIVE Final   GC Probe RNA NEGATIVE  NEGATIVE Final   Comment: (NOTE)                                                                                                **Normal Reference Range: Negative**          Assay performed using the Gen-Probe APTIMA COMBO2 (R) Assay.     Acceptable specimen types for this assay include APTIMA Swabs (Unisex,     endocervical, urethral, or vaginal), first void urine, and ThinPrep     liquid based cytology samples.     [facility]   Medications: I have reviewed the patient's current medications. Scheduled Meds: . amiodarone  200 mg Oral BID  . colchicine  0.6 mg Oral Q3 days  . darbepoetin (ARANESP) injection - DIALYSIS  200 mcg Intravenous Q Thu-HD  . diclofenac sodium  2 g Topical QID  . Febuxostat  1 tablet Oral Daily  . feeding supplement (PRO-STAT SUGAR FREE 64)  30 mL Oral BID WC  . feeding supplement (RESOURCE BREEZE)  1 Container Oral BID BM  . insulin aspart  0-9 Units Subcutaneous TID WC  . insulin glargine  28 Units Subcutaneous QHS  . LORazepam  0.5 mg Oral Daily  . midodrine  10 mg Oral BID WC  . multivitamin  1 tablet Oral QHS  . pantoprazole  40 mg Oral QAC breakfast  . phosphorus  250 mg Oral BID  . sodium chloride 0.9 % 100 mL with sodium thiosulfate 25 g  25 g Intravenous Q1200  . traMADol  100 mg Oral BID  . Warfarin - Pharmacist Dosing Inpatient   Does not apply q1800   Continuous Infusions:   PRN Meds:.sodium chloride, sodium chloride, acetaminophen, feeding supplement (NEPRO CARB STEADY), fluticasone, HYDROcodone-acetaminophen, lidocaine (PF), lidocaine-prilocaine, loratadine, pentafluoroprop-tetrafluoroeth, white petrolatum  Assessment/Plan:  Bilateral leg induration w/ erosion, ulcerations to L medial leg w/ hx of ESRD, likely calciphylaxis: Pain/induration continue to improve slightly. Patient is in better spirits today. - continue midodrine BID; BP has improved -renal function panel and CBC in AM  Continue plan for calciphylaxis treatment: -continue coumadin per  cardiology's recommendation -continue high flow O2 therapy (venturi mask at 15LPM for 2 hours daily) -sodium thiosulfate 25g IV in 100ml NS over 30-60 mins at the end of each HD session - NO vitamin D and calcium supplementation -aggressive HD- daily for 7 days (day 5/7), then 5-6 times weekly after that -blood transfusion to keep Hb 9.5-10.5 -continue cinacalcet and sevelamer  Necrosis of distal penis and foreskin--s/p circumcision and debridement of penis 02/04/13 by urology. Continues to be afebrile. Urology saw this AM and still do not plan for return to OR.  -abx d/c'd on 1/16   A Fib: HR stable. He is in and out of afib. INR supratherapeutic again  today. -coumadin per pharmacy -continue amiodarone  DM-II: A1c 4.9 on 12/13/12, which may not be accurate due to dialysis. Patient takes Lantus 45U qHS at home. CBGs stabilized after lantus 28U last night (129-196). Will continue this for now until he is eating better. - Lantus to 28U qHS -SSI   CHF with EF of 25%: Stable.  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    The patient does have a current PCP Evelena Peat, DO) and does need an [facility] hospital follow-up appointment after discharge.  The patient does not have transportation limitations that hinder transportation to clinic appointments.  Services Needed at time of discharge: Y = Yes, Blank = No PT:   OT:   RN:   Equipment:   Other:     LOS: 9 days   Windell Hummingbird, MD 02/12/2013, 9:05 AM

## 2013-02-12 NOTE — Progress Notes (Signed)
ANTICOAGULATION CONSULT NOTE - Follow Up Consult  Pharmacy Consult for Coumadin Indication: atrial fibrillation   Allergies  Allergen Reactions  . Argatroban Other (See Comments)    Ventricular tachycardia  . Ace Inhibitors     Acute renal failure with multiple trials  . Heparin     Thrombcytopenia. SRA positive. See miscellaneous test (SRA results)    Patient Measurements: Height: 6' (182.9 cm) Weight: 317 lb 14.5 oz (144.2 kg) IBW/kg (Calculated) : 77.6 Heparin Dosing Weight:    Vital Signs: Temp: 98.2 F (36.8 C) (01/17 0433) Temp src: Oral (01/17 0433) BP: 94/57 mmHg (01/17 0433) Pulse Rate: 103 (01/17 0433)  Labs:  Recent Labs  02/10/13 0510 02/11/13 0516 02/11/13 1352 02/12/13 0436  HGB  --   --  9.7*  --   HCT  --   --  29.6*  --   PLT  --   --  329  --   LABPROT 28.5* 32.5*  --  32.9*  INR 2.80* 3.32*  --  3.37*  CREATININE 3.13* 3.14*  --   --     Estimated Creatinine Clearance: 42.4 ml/min (by C-G formula based on Cr of 3.14).   Assessment: 47 yp M admit with penis pain/discharge and leg pain was on abx.  Previous hx of a fib on coumadin.  Pharmacy consulted for coumadin monitoring.  Last PTA coumadin dosing: 2.5 mg M, and 3.75 Sun/Tu/Wed/Th/Fri/sat.  INR today is still supra therapeutic at 3.37.  H/H are still low stable, and plt wnl.  Will hold coumadin again today.  Goal of Therapy:  INR 2-3 Monitor platelets by anticoagulation protocol: Yes  Plan:  Hold Coumadin x 1 day. Daily INR.  Anabel Bene, PharmD Clinical Pharmacist Resident Pager: [phone]  Anabel Bene 02/12/2013,8:44 AM

## 2013-02-12 NOTE — Progress Notes (Signed)
Subjective:  Feeling better, especially with improving wounds; transferred to chair yesterday, but still with significant leg weakness  Objective: Vital signs in last 24 hours: Temp:  [97.9 F (36.6 C)-98.2 F (36.8 C)] 98.2 F (36.8 C) (01/17 0433) Pulse Rate:  [83-117] 103 (01/17 0433) Resp:  [14-19] 18 (01/17 0433) BP: (87-126)/(57-72) 94/57 mmHg (01/17 0433) SpO2:  [94 %-98 %] 98 % (01/17 0433) Weight:  [141 kg (310 lb 13.6 oz)-144.2 kg (317 lb 14.5 oz)] 144.2 kg (317 lb 14.5 oz) (01/17 0433) Weight change: 1 kg (2 lb 3.3 oz)  Intake/Output from previous day: 01/16 0701 - 01/17 0700 In: 480 [P.O.:480] Out: 2018    Lab Results:  Recent Labs  02/11/13 1352  WBC 10.8*  HGB 9.7*  HCT 29.6*  PLT 329   BMET:  Recent Labs  02/10/13 0510 02/11/13 0516  NA 140 140  K 4.4 4.5  CL 94* 92*  CO2 22 21  GLUCOSE 63* 85  BUN 14 15  CREATININE 3.13* 3.14*  CALCIUM 8.4 8.4  ALBUMIN 1.8*  --    No results found for this basename: PTH,  in the last 72 hours Iron Studies: No results found for this basename: IRON, TIBC, TRANSFERRIN, FERRITIN,  in the last 72 hours  EXAM:  General appearance: Alert, in no apparent distress  Resp: CTA without rales, rhonchi, or wheezes  Cardio: RRR without murmur or rub  GI: + BS, soft and nontender  Extremities: Trace pretibial edema bilaterally  Access: AVF @ RFA with + bruit  D[facility]  4.75h F180 Bath 2K/2.25Ca DW 143.5kg RFA AVF No heparin  EPO 22K Hect 6ug No Fe at center  Assessment/Plan:  1. Penile gangrene / L thigh gangrene / diffuse bilat LE discoloration w induration/pain - clinical presentation c/w calciphylaxis; prognosis guarded. Pain has improved since admission. Wounds may be a little better. Per UTD recommendations for calciphylaxis are:  1. Daily HD x 7d then 5-6days/wk until wounds stabilize; he will finish first week of daily HD tomorrow 2. PTH lowering- not an issue as PTH is low at 56 3. Na  thiosulfate 4. Goal adj Ca 8.5-9.5; Ca 10.2 today, avoid Ca-containing meds and vit D, low Ca dialysate Use po KCl to keep K over 4 since only low Ca bath we have is 2K 5. Goal phos 2.5-3.5; phos up now on Kphos, will d/c; use nonCa binders if needed but will hold off for now as will come down with dialysis 6. Avoid IV Fe 7. Use epo and transfusion to keep Hb up 9.5-10.5 8. Oxygen Rx (ordered) 9. Aggressive wound care program and pain control 10. Consider coumadin withdrawal if not improving with above measures   With this program in a case series, 6 of 7 patients with calciphylaxis recovered. 2. ESRD - extra HD as above, K 4.5. HD pending today. 3. Chronic hypotension/volume overload - below dry wt, significant LE edema, cardiomyopathy w low BP's on midodrine. Continue vol removal, will increase goal 2.5-3kg per Rx 4. Anemia/CKD - Hgb 9.7 on Aranesp 200 mcg on Thurs, no IV Fe due to #1, as above 5. NICM / bivent HF / LVEF 25% / AICD 6. Pacemaker 7. Morbid obesity   LOS: 9 days   LYLES,CHARLES 02/12/2013,9:15 AM  I have seen and examined patient, discussed with PA and agree with assessment and plan as outlined above with additions as indicated. Vinson Moselle MD pager [phone]    cell [phone] 02/12/2013, 12:04 PM

## 2013-02-12 NOTE — Procedures (Signed)
I was present at this dialysis session, have reviewed the session itself and made  appropriate changes  Rob Bain Whichard MD (pgr) [phone]    (c) [phone] 02/12/2013, 4:13 PM   

## 2013-02-13 DIAGNOSIS — N4889 Other specified disorders of penis: Secondary | ICD-10-CM

## 2013-02-13 LAB — CBC
HCT: 27.8 % — ABNORMAL LOW (ref 39.0–52.0)
Hemoglobin: 8.9 g/dL — ABNORMAL LOW (ref 13.0–17.0)
MCH: 28.3 pg (ref 26.0–34.0)
MCHC: 32 g/dL (ref 30.0–36.0)
MCV: 88.3 fL (ref 78.0–100.0)
Platelets: 336 10*3/uL (ref 150–400)
RBC: 3.15 MIL/uL — AB (ref 4.22–5.81)
RDW: 18.1 % — AB (ref 11.5–15.5)
WBC: 11.7 10*3/uL — AB (ref 4.0–10.5)

## 2013-02-13 LAB — GLUCOSE, CAPILLARY
GLUCOSE-CAPILLARY: 137 mg/dL — AB (ref 70–99)
GLUCOSE-CAPILLARY: 251 mg/dL — AB (ref 70–99)
Glucose-Capillary: 156 mg/dL — ABNORMAL HIGH (ref 70–99)
Glucose-Capillary: 159 mg/dL — ABNORMAL HIGH (ref 70–99)

## 2013-02-13 LAB — RENAL FUNCTION PANEL
ALBUMIN: 1.7 g/dL — AB (ref 3.5–5.2)
BUN: 17 mg/dL (ref 6–23)
CALCIUM: 8.5 mg/dL (ref 8.4–10.5)
CO2: 21 meq/L (ref 19–32)
CREATININE: 2.95 mg/dL — AB (ref 0.50–1.35)
Chloride: 91 mEq/L — ABNORMAL LOW (ref 96–112)
GFR, EST AFRICAN AMERICAN: 27 mL/min — AB (ref >90)
GFR, EST NON AFRICAN AMERICAN: 24 mL/min — AB (ref >90)
Glucose, Bld: 175 mg/dL — ABNORMAL HIGH (ref 70–99)
Phosphorus: 3.9 mg/dL (ref 2.3–4.6)
Potassium: 4.1 mEq/L (ref 3.7–5.3)
SODIUM: 140 meq/L (ref 137–147)

## 2013-02-13 LAB — PROTIME-INR
INR: 2.77 — ABNORMAL HIGH (ref 0.00–1.49)
PROTHROMBIN TIME: 28.3 s — AB (ref 11.6–15.2)

## 2013-02-13 MED ORDER — NEPRO/CARBSTEADY PO LIQD: 237.0000 mL | ORAL | Status: DC | PRN

## 2013-02-13 MED ORDER — WARFARIN SODIUM 1 MG PO TABS
1.0000 mg | ORAL_TABLET | Freq: Once | ORAL | Status: AC
  Administered 2013-02-13: 1 mg via ORAL
  Filled 2013-02-13: qty 1

## 2013-02-13 MED ORDER — SODIUM CHLORIDE 0.9 % IV SOLN: 100.0000 mL | INTRAVENOUS | Status: DC | PRN

## 2013-02-13 MED ORDER — LIDOCAINE-PRILOCAINE 2.5-2.5 % EX CREA
1.0000 | TOPICAL_CREAM | CUTANEOUS | Status: DC | PRN
Start: 2013-02-13 — End: 2013-02-13

## 2013-02-13 MED ORDER — ALTEPLASE 2 MG IJ SOLR: 2.0000 mg | Freq: Once | INTRAMUSCULAR | Status: AC | PRN

## 2013-02-13 MED ORDER — LIDOCAINE HCL (PF) 1 % IJ SOLN
5.0000 mL | INTRAMUSCULAR | Status: DC | PRN
Start: 2013-02-13 — End: 2013-02-13

## 2013-02-13 MED ORDER — PENTAFLUOROPROP-TETRAFLUOROETH EX AERO
1.0000 | INHALATION_SPRAY | CUTANEOUS | Status: DC | PRN
Start: 2013-02-13 — End: 2013-02-13

## 2013-02-13 MED ORDER — HEPARIN SODIUM (PORCINE) 1000 UNIT/ML DIALYSIS: 1000.0000 [IU] | INTRAMUSCULAR | Status: DC | PRN

## 2013-02-13 MED ORDER — MIDODRINE HCL 5 MG PO TABS
10.0000 mg | ORAL_TABLET | Freq: Three times a day (TID) | ORAL | Status: DC
  Administered 2013-02-13 – 2013-02-16 (×10): 10 mg via ORAL
  Filled 2013-02-13 (×11): qty 2

## 2013-02-13 NOTE — Progress Notes (Signed)
     Primary cardiologist: Dr. Dietrich Pates  Please see my extensive note from yesterday. The patient continues with wound care, generally slow improvement with present measures. INR 2.7 - Coumadin has been held recently due to supratherapeutic levels. Decision regarding long-term continuation of Coumadin should be further discussed with patient's primary cardiologist Dr. Tenny Craw and Nephrology. My discussion with pharmacy yesterday lead to an understanding that while there is some association with worsening calciphylaxis in the setting of ESRD and Coumadin use, the actual risk of this occurrence is relatively low, and in fact the risk of stroke with calciphylaxis is also increased. As already noted, patient does not have other good options for anticoagulation.  Jonelle Sidle, M.D., F.A.C.C.

## 2013-02-13 NOTE — Procedures (Deleted)
I was present at this dialysis session, have reviewed the session itself and made  appropriate changes  Vinson Moselle MD (pgr) [phone]    ([phone] 02/13/2013, 1:18 PM

## 2013-02-13 NOTE — Progress Notes (Signed)
ANTICOAGULATION CONSULT NOTE - Follow Up Consult  Pharmacy Consult for Coumadin Indication: atrial fibrillation   Allergies  Allergen Reactions  . Argatroban Other (See Comments)    Ventricular tachycardia  . Ace Inhibitors     Acute renal failure with multiple trials  . Heparin     Thrombcytopenia. SRA positive. See miscellaneous test (SRA results)    Patient Measurements: Height: 6' (182.9 cm) Weight: 313 lb 7.9 oz (142.2 kg) IBW/kg (Calculated) : 77.6 Heparin Dosing Weight:    Vital Signs: Temp: 98 F (36.7 C) (01/18 0453) Temp src: Oral (01/18 0453) BP: 105/61 mmHg (01/18 0453) Pulse Rate: 94 (01/18 0453)  Labs:  Recent Labs  02/11/13 0516 02/11/13 1352 02/12/13 0436 02/12/13 0932 02/13/13 0535  HGB  --  9.7*  --   --  8.9*  HCT  --  29.6*  --   --  27.8*  PLT  --  329  --   --  336  LABPROT 32.5*  --  32.9*  --  28.3*  INR 3.32*  --  3.37*  --  2.77*  CREATININE 3.14*  --   --  3.28* 2.95*    Estimated Creatinine Clearance: 44.8 ml/min (by C-G formula based on Cr of 2.95).   Assessment: 48 yo M admit with penis pain/discharge and leg pain was on abx.  Previous hx of a fib on coumadin, and hx of calciphylaxis.  There is some concerns with coumadin worsening calciphylaxis in pt.  However, spoke with nephrologist yesterday and pt is improving, and it is okay to continue coumadin based on INR for now.  INR today is 2.77 and therapeutic.  H/H are still low stable, and plt wnl.  Will give small dose of coumadin today, since INR has decreased into range.  Last PTA coumadin dosing: 2.5 mg M, and 3.75 Sun/Tu/Wed/Th/Fri/sat.   Goal of Therapy:  INR 2-3 Monitor platelets by anticoagulation protocol: Yes  Plan:  Coumadin 1 mg  x 1 day Daily INR  Anabel Bene, PharmD Clinical Pharmacist Resident Pager: [phone]  Anabel Bene 02/13/2013,7:43 AM

## 2013-02-13 NOTE — Progress Notes (Signed)
Subjective:   Seen on dialysis, no current complaints, may start rehab tomorrow  Objective: Vital signs in last 24 hours: Temp:  [98 F (36.7 C)-98.1 F (36.7 C)] 98 F (36.7 C) (01/18 0453) Pulse Rate:  [89-113] 94 (01/18 0453) Resp:  [14-26] 18 (01/18 0453) BP: (62-105)/(47-67) 105/61 mmHg (01/18 0453) SpO2:  [91 %-98 %] 91 % (01/18 0453) Weight:  [142.2 kg (313 lb 7.9 oz)-144.1 kg (317 lb 10.9 oz)] 142.2 kg (313 lb 7.9 oz) (01/18 0453) Weight change: 1.1 kg (2 lb 6.8 oz)  Intake/Output from previous day: 01/17 0701 - 01/18 0700 In: 480 [P.O.:480] Out: 2248  Intake/Output this shift: Total I/O In: 240 [P.O.:240] Out: -   Lab Results:  Recent Labs  02/11/13 1352 02/13/13 0535  WBC 10.8* 11.7*  HGB 9.7* 8.9*  HCT 29.6* 27.8*  PLT 329 336   BMET:  Recent Labs  02/12/13 0932 02/13/13 0535  NA 139 140  K 3.8 4.1  CL 91* 91*  CO2 22 21  GLUCOSE 123* 175*  BUN 18 17  CREATININE 3.28* 2.95*  CALCIUM 8.3* 8.5  ALBUMIN 1.8* 1.7*   No results found for this basename: PTH,  in the last 72 hours Iron Studies: No results found for this basename: IRON, TIBC, TRANSFERRIN, FERRITIN,  in the last 72 hours  EXAM:  General appearance: Alert, in no apparent distress  Resp: CTA without rales, rhonchi, or wheezes  Cardio: RRR without murmur or rub  GI: + BS, soft and nontender  Extremities: Trace pretibial edema bilaterally  Access: AVF @ RFA with + bruit   D[facility]  4.75h F180 Bath 2K/2.25Ca DW 143.5kg RFA AVF No heparin  EPO 22K Hect 6ug No Fe at center  Assessment/Plan:  1. Penile gangrene / L thigh gangrene / diffuse bilat LE discoloration with induration/pain - clinical presentation c/w calciphylaxis; prognosis guarded, but wounds improving, pain better controlled.  Per UTD recommendations for calciphylaxis are:  1. Daily HD x 7d then 5-6 days/wk until wounds stabilize; 7th day of HD today. 2. PTH lowering - not an issue as PTH is low at 56 3. Na  thiosulfate 4. Goal adj Ca 8.5-9.5; Ca 10.5 today, avoid Ca-containing meds and vit D, low Ca dialysate; on KCl 20 mEq PO bid to keep K over 4 (4.1 today) since the only low Ca bath we have is 2K. 5. Goal phos 2.5-3.5; now 3.9, use nonCa binders if needed but will hold off for now as will come down with dialysis. 6. Avoid IV Fe 7. Use epo and transfusion to keep Hb 9.5-10.5 8. Oxygen Rx (ordered) 9. Aggressive wound care program and pain control 10. Will consider coumadin withdrawal if not improving with above measures; cardiology has seen                With this program in a case series, 6 of 7 patients with calciphylaxis recovered.  2. ESRD - extra HD as above. HD today and tomorrow 3. Chronic hypotension/volume overload - BP 88/58, below dry wt, with ++LE edema, cardiomyopathy w low BP's on midodrine. Continue vol removal, with goal 2-2.5 kg per Rx maintaining SBP >80.  Not complying w fluid restriction, move to renal floor 4. Anemia/CKD - Hgb 8.9 on Aranesp 200 mcg on Thurs, no IV Fe due to #1, as above. 5. NICM / bivent HF / LVEF 25% / AICD 6. Pacemaker 7. Morbid obesity   LOS: 10 days   LYLES,CHARLES 02/13/2013,11:08 AM  I have  seen and examined patient, discussed with PA and agree with assessment and plan as outlined above with additions as indicated. Vinson Moselle MD pager [phone]    cell [phone] 02/13/2013, 11:40 AM

## 2013-02-13 NOTE — Progress Notes (Signed)
Subjective: Adam [name] was seen and examined at bedside.  He is doing well but getting tired of being in the hospital.  He feels like he is doing better and is still nervous about having any more surgery.   Objective: Vital signs in last 24 hours: Filed Vitals:   02/13/13 1229 02/13/13 1256 02/13/13 1330 02/13/13 1358  BP: 89/64 88/62 86/61  92/62  Pulse: 95 104 103 106  Temp:      TempSrc:      Resp: 16 16 16 16   Height:      Weight:      SpO2:       Weight change: 2 lb 6.8 oz (1.1 kg)  Intake/Output Summary (Last 24 hours) at 02/13/13 1422 Last data filed at 02/13/13 0900  Gross per 24 hour  Intake    360 ml  Output   2248 ml  Net  -1888 ml   Physical Exam Vitals reviewed. General: sitting up on the side of the bed, NAD HEENT: PERRL, EOMI, +scleral icterus Cardiac: RRR Pulm: clear to auscultation bilaterally, no wheezes, rales, or rhonchi Abd: soft, nontender, obese, nondistended, BS present Ext: indurated and hyperpigmented b/l lower extremities, hardened skin up to thighs, multiple erosions/skin breakdown that appear to be healing on thigh's and one on left foot, loose dressings in place that is clean and dry Neurologic: alert & oriented X3, moving all extremities spontaneously GU: circumscised penis with darkened hard area on tip, non-tender to palpation, dressing in place on lateral surfaces of shaft, appears to have opening of skin on dorsal surface of glans with mild pus drainage but difficult to determine given dressings in area as well. No foul odor.   Lab Results: Basic Metabolic Panel:  Recent Labs Lab 02/12/13 0932 02/13/13 0535  NA 139 140  K 3.8 4.1  CL 91* 91*  CO2 22 21  GLUCOSE 123* 175*  BUN 18 17  CREATININE 3.28* 2.95*  CALCIUM 8.3* 8.5  PHOS 4.5 3.9   Liver Function Tests:  Recent Labs Lab 02/12/13 0932 02/13/13 0535  ALBUMIN 1.8* 1.7*   CBC:  Recent Labs Lab 02/11/13 1352 02/13/13 0535  WBC 10.8* 11.7*  HGB 9.7* 8.9*  HCT  29.6* 27.8*  MCV 88.9 88.3  PLT 329 336   CBG:  Recent Labs Lab 02/12/13 0559 02/12/13 1110 02/12/13 1930 02/12/13 2133 02/13/13 0712 02/13/13 1050  GLUCAP 129* 124* 174* 218* 156* 137*   Coagulation:  Recent Labs Lab 02/10/13 0510 02/11/13 0516 02/12/13 0436 02/13/13 0535  LABPROT 28.5* 32.5* 32.9* 28.3*  INR 2.80* 3.32* 3.37* 2.77*   Urine Drug Screen: Drugs of Abuse     Component Value Date/Time   LABOPIA NEG 03/17/2006 1455   COCAINSCRNUR NEG 03/17/2006 1455   LABBENZ NEG 03/17/2006 1455   AMPHETMU NEG 03/17/2006 1455    Micro Results: Recent Results (from the past 240 hour(s))  WOUND CULTURE     Status: None   Collection Time    02/04/13 10:38 AM      Result Value Range Status   Specimen Description WOUND PENIS   Final   Special Requests NONE   Final   Gram Stain     Final   Value: FEW WBC PRESENT,BOTH PMN AND MONONUCLEAR     RARE SQUAMOUS EPITHELIAL CELLS PRESENT     MODERATE GRAM NEGATIVE RODS     MODERATE GRAM POSITIVE COCCI IN PAIRS     Performed at [facility]   Culture  Final   Value: MULTIPLE ORGANISMS PRESENT, NONE PREDOMINANT     Note: NO STAPHYLOCOCCUS AUREUS ISOLATED NO GROUP A STREP (S.PYOGENES) ISOLATED     Performed at [facility]   Report Status 02/06/2013 FINAL   Final  URINE CULTURE     Status: None   Collection Time    02/06/13  6:11 AM      Result Value Range Status   Specimen Description URINE, CATHETERIZED   Final   Special Requests NONE   Final   Culture  Setup Time     Final   Value: 02/06/2013 18:02     Performed at [facility] Count     Final   Value: >=100,000 COLONIES/ML     Performed at [facility]   Culture     Final   Value: Multiple bacterial morphotypes present, none predominant. Suggest appropriate recollection if clinically indicated.     Performed at [facility]   Report Status 02/08/2013 FINAL   Final  GC/CHLAMYDIA PROBE AMP     Status: None    Collection Time    02/06/13  6:18 AM      Result Value Range Status   CT Probe RNA NEGATIVE  NEGATIVE Final   GC Probe RNA NEGATIVE  NEGATIVE Final   Comment: (NOTE)                                                                                               **Normal Reference Range: Negative**          Assay performed using the Gen-Probe APTIMA COMBO2 (R) Assay.     Acceptable specimen types for this assay include APTIMA Swabs (Unisex,     endocervical, urethral, or vaginal), first void urine, and ThinPrep     liquid based cytology samples.     Performed at [facility]   Medications: I have reviewed the patient's current medications. Scheduled Meds: . amiodarone  200 mg Oral BID  . colchicine  0.6 mg Oral Q3 days  . darbepoetin (ARANESP) injection - DIALYSIS  200 mcg Intravenous Q Thu-HD  . diclofenac sodium  2 g Topical QID  . Febuxostat  1 tablet Oral Daily  . feeding supplement (PRO-STAT SUGAR FREE 64)  30 mL Oral BID WC  . feeding supplement (RESOURCE BREEZE)  1 Container Oral BID BM  . insulin aspart  0-9 Units Subcutaneous TID WC  . insulin glargine  28 Units Subcutaneous QHS  . LORazepam  0.5 mg Oral Daily  . midodrine  10 mg Oral BID WC  . multivitamin  1 tablet Oral QHS  . pantoprazole  40 mg Oral QAC breakfast  . potassium chloride  20 mEq Oral BID  . sodium chloride 0.9 % 100 mL with sodium thiosulfate 25 g  25 g Intravenous Q1200  . traMADol  100 mg Oral BID  . warfarin  1 mg Oral ONCE-1800  . 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 On daily HD for 7 days with sodium thiosulfate and high flow oxygen therapy.  -continue midodrine TID -trend renal function panel and CBC  -7th day of daily HD today, then will move to 5-6 days/week until stabilization of wound -will continue coumadin for now, appreciate cardiology recommendations who recommend continuing for now but may benefit from hearing from primary cardiologist Dr. Tenny Craw.   -continue high flow O2 therapy (venturi mask at 15LPM for 2 hours daily) -sodium thiosulfate at the end of each HD session -avoiding calcium containing meds and vit D, low Ca diaslysate, on KCL po bid.  Goal adjusted Ca 8.5-9.5 -use epo and blood transfusions to keep Hb 9.5-10.5, today Hb 8.9, avoid IV iron -aggressive wound care and pain control -will continue volume removal as tolerated with BP -appreciate renal following  Necrosis of distal penis and foreskin--s/p circumcision and debridement of penis 02/04/13 by urology.  No surgery for now per urology.  -abx d/c'd on 1/16 (Zosyn) but slight increase in wbc today (11.7) and mild pus like drainage under glans concerning for possible signs of new infection. Will monitor closely, remains afebrile for now. -am CBC trend leukocytosis, may need to restart antibiotics -appreciate urology recommendations  A Fib: HR stable. He is in and out of afib. INR 2.77 today -coumadin per pharmacy and may benefit from discussion with Dr. Tenny Craw, but for now with input of cardiology and renal, will continue coumadin -continue amiodarone  DM-II: A1c 4.9 on 12/13/12, which may not be accurate due to dialysis. Patient takes Lantus 45U qHS at home.  -continue lantus to 28U qHS -SSI sensitive -continue cbg monitoring  Hypotension--chronic but lower than usual this hospital admission. Improving with midodrine.  Getting daily HD for volume removal with goal of 2-2.5 kg per Rx. -continue midodrine TID  CHF with EF of ~25% per echo 12/2011 with NICM and ICD in place: Stable. 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 D/C to CIR if approved.     The patient does have a current PCP Evelena Peat, DO) and does need an [facility] hospital follow-up appointment after discharge.  The patient does not have transportation limitations that hinder transportation to clinic appointments.  Services Needed at time of discharge: Y = Yes, Blank = No PT: CIR  OT:   RN:   Equipment:   Other:     LOS: 10 days   Darden Palmer, MD 02/13/2013, 2:22 PM

## 2013-02-13 NOTE — Progress Notes (Signed)
  Echocardiogram 2D Echocardiogram has been performed.  Georgian Co 02/13/2013, 4:06 PM

## 2013-02-13 NOTE — Procedures (Signed)
I was present at this dialysis session, have reviewed the session itself and made  appropriate changes  Vinson Moselle MD (pgr) [phone]    [phone] 02/13/2013, 2:31 PM

## 2013-02-14 LAB — RENAL FUNCTION PANEL
Albumin: 1.7 g/dL — ABNORMAL LOW (ref 3.5–5.2)
BUN: 18 mg/dL (ref 6–23)
CALCIUM: 8.9 mg/dL (ref 8.4–10.5)
CO2: 20 mEq/L (ref 19–32)
CREATININE: 3.04 mg/dL — AB (ref 0.50–1.35)
Chloride: 92 mEq/L — ABNORMAL LOW (ref 96–112)
GFR calc Af Amer: 26 mL/min — ABNORMAL LOW (ref >90)
GFR calc non Af Amer: 23 mL/min — ABNORMAL LOW (ref >90)
Glucose, Bld: 156 mg/dL — ABNORMAL HIGH (ref 70–99)
PHOSPHORUS: 4 mg/dL (ref 2.3–4.6)
Potassium: 4.1 mEq/L (ref 3.7–5.3)
Sodium: 141 mEq/L (ref 137–147)

## 2013-02-14 LAB — GLUCOSE, CAPILLARY
GLUCOSE-CAPILLARY: 130 mg/dL — AB (ref 70–99)
GLUCOSE-CAPILLARY: 204 mg/dL — AB (ref 70–99)
GLUCOSE-CAPILLARY: 166 mg/dL — AB (ref 70–99)

## 2013-02-14 LAB — CBC WITH DIFFERENTIAL/PLATELET
Basophils Absolute: 0 10*3/uL (ref 0.0–0.1)
Basophils Relative: 0 % (ref 0–1)
EOS ABS: 0.1 10*3/uL (ref 0.0–0.7)
EOS PCT: 1 % (ref 0–5)
HCT: 28.3 % — ABNORMAL LOW (ref 39.0–52.0)
Hemoglobin: 9.1 g/dL — ABNORMAL LOW (ref 13.0–17.0)
LYMPHS PCT: 11 % — AB (ref 12–46)
Lymphs Abs: 1.4 10*3/uL (ref 0.7–4.0)
MCH: 28.5 pg (ref 26.0–34.0)
MCHC: 32.2 g/dL (ref 30.0–36.0)
MCV: 88.7 fL (ref 78.0–100.0)
Monocytes Absolute: 0.9 10*3/uL (ref 0.1–1.0)
Monocytes Relative: 7 % (ref 3–12)
Neutro Abs: 10.8 10*3/uL — ABNORMAL HIGH (ref 1.7–7.7)
Neutrophils Relative %: 82 % — ABNORMAL HIGH (ref 43–77)
PLATELETS: 357 10*3/uL (ref 150–400)
RBC: 3.19 MIL/uL — AB (ref 4.22–5.81)
RDW: 18.2 % — AB (ref 11.5–15.5)
WBC: 13.3 10*3/uL — AB (ref 4.0–10.5)

## 2013-02-14 LAB — PROTIME-INR
INR: 2.44 — ABNORMAL HIGH (ref 0.00–1.49)
Prothrombin Time: 25.7 seconds — ABNORMAL HIGH (ref 11.6–15.2)

## 2013-02-14 MED ORDER — SODIUM CHLORIDE 0.9 % IV SOLN: 100.0000 mL | INTRAVENOUS | Status: DC | PRN

## 2013-02-14 MED ORDER — PENTAFLUOROPROP-TETRAFLUOROETH EX AERO: 1.0000 "application " | INHALATION_SPRAY | CUTANEOUS | Status: DC | PRN

## 2013-02-14 MED ORDER — WARFARIN SODIUM 3 MG PO TABS
3.0000 mg | ORAL_TABLET | Freq: Once | ORAL | Status: AC
  Administered 2013-02-14: 3 mg via ORAL
  Filled 2013-02-14: qty 1

## 2013-02-14 MED ORDER — LIDOCAINE-PRILOCAINE 2.5-2.5 % EX CREA
1.0000 "application " | TOPICAL_CREAM | CUTANEOUS | Status: DC | PRN
  Filled 2013-02-14: qty 5

## 2013-02-14 MED ORDER — LIDOCAINE HCL (PF) 1 % IJ SOLN: 5.0000 mL | INTRAMUSCULAR | Status: DC | PRN

## 2013-02-14 MED ORDER — NEPRO/CARBSTEADY PO LIQD
237.0000 mL | ORAL | Status: DC | PRN
  Filled 2013-02-14: qty 237

## 2013-02-14 MED ORDER — ALTEPLASE 2 MG IJ SOLR
2.0000 mg | Freq: Once | INTRAMUSCULAR | Status: DC | PRN
  Filled 2013-02-14: qty 2

## 2013-02-14 NOTE — Progress Notes (Signed)
Subjective: Mr. Schnelle is doing fine this morning. I saw him in HD. His mood is somewhat low because he is worried about his medical condition. His pain is well controlled and he thinks he is improving. I spoke with urology today and they will come evaluate him either tonight or tomorrow morning.  Objective: Vital signs in last 24 hours: Filed Vitals:   02/14/13 1130 02/14/13 1200 02/14/13 1210 02/14/13 1225  BP: 96/64 93/66 94/68  98/65  Pulse: 90 94 101 99  Temp:    98.3 F (36.8 C)  TempSrc:    Oral  Resp: 20 18 20 18   Height:      Weight:    313 lb 7.9 oz (142.2 kg)  SpO2:    94%   Weight change: -3 lb 4.9 oz (-1.5 kg)  Intake/Output Summary (Last 24 hours) at 02/14/13 1337 Last data filed at 02/14/13 1225  Gross per 24 hour  Intake      0 ml  Output   3300 ml  Net  -3300 ml   Physical Exam General: lying flat in bed in HD HEENT: NCAT, vision grossly intact Cardiac: RRR Pulm: clear to auscultation bilaterally, no wheezes, rales, or rhonchi Abd: soft, nontender, obese, nondistended, BS present Ext: indurated and hyperpigmented b/l lower extremities, hardened skin up to thighs, multiple erosions/skin breakdown that appear to be healing w/o drainage on bilateral inner thighs; loose dressings in place that are clean and dry Neurologic: alert & oriented X3, moving all extremities spontaneously GU: circumscised penis with darkened hard area on tip, non-tender to palpation, some mild white/yellow drainage to shaft, though no foul odor.   Lab Results: Basic Metabolic Panel:  Recent Labs Lab 02/13/13 0535 02/14/13 0419  NA 140 141  K 4.1 4.1  CL 91* 92*  CO2 21 20  GLUCOSE 175* 156*  BUN 17 18  CREATININE 2.95* 3.04*  CALCIUM 8.5 8.9  PHOS 3.9 4.0   Liver Function Tests:  Recent Labs Lab 02/13/13 0535 02/14/13 0419  ALBUMIN 1.7* 1.7*   CBC:  Recent Labs Lab 02/13/13 0535 02/14/13 0419  WBC 11.7* 13.3*  NEUTROABS  --  10.8*  HGB 8.9* 9.1*  HCT 27.8*  28.3*  MCV 88.3 88.7  PLT 336 357   CBG:  Recent Labs Lab 02/12/13 2133 02/13/13 0712 02/13/13 1050 02/13/13 1646 02/13/13 2033 02/14/13 0551  GLUCAP 218* 156* 137* 159* 251* 130*   Coagulation:  Recent Labs Lab 02/11/13 0516 02/12/13 0436 02/13/13 0535 02/14/13 0419  LABPROT 32.5* 32.9* 28.3* 25.7*  INR 3.32* 3.37* 2.77* 2.44*   Urine Drug Screen: Drugs of Abuse     Component Value Date/Time   LABOPIA NEG 03/17/2006 1455   COCAINSCRNUR NEG 03/17/2006 1455   LABBENZ NEG 03/17/2006 1455   AMPHETMU NEG 03/17/2006 1455    Micro Results: Recent Results (from the past 240 hour(s))  URINE CULTURE     Status: None   Collection Time    02/06/13  6:11 AM      Result Value Range Status   Specimen Description URINE, CATHETERIZED   Final   Special Requests NONE   Final   Culture  Setup Time     Final   Value: 02/06/2013 18:02     Performed at [facility] Count     Final   Value: >=100,000 COLONIES/ML     Performed at [facility]   Culture     Final   Value: Multiple bacterial morphotypes  present, none predominant. Suggest appropriate recollection if clinically indicated.     Performed at [facility]   Report Status 02/08/2013 FINAL   Final  GC/CHLAMYDIA PROBE AMP     Status: None   Collection Time    02/06/13  6:18 AM      Result Value Range Status   CT Probe RNA NEGATIVE  NEGATIVE Final   GC Probe RNA NEGATIVE  NEGATIVE Final   Comment: (NOTE)                                                                                               **Normal Reference Range: Negative**          Assay performed using the Gen-Probe APTIMA COMBO2 (R) Assay.     Acceptable specimen types for this assay include APTIMA Swabs (Unisex,     endocervical, urethral, or vaginal), first void urine, and ThinPrep     liquid based cytology samples.     Performed at [facility]   Medications: I have reviewed the patient's current  medications. Scheduled Meds: . amiodarone  200 mg Oral BID  . colchicine  0.6 mg Oral Q3 days  . darbepoetin (ARANESP) injection - DIALYSIS  200 mcg Intravenous Q Thu-HD  . diclofenac sodium  2 g Topical QID  . Febuxostat  1 tablet Oral Daily  . feeding supplement (PRO-STAT SUGAR FREE 64)  30 mL Oral BID WC  . feeding supplement (RESOURCE BREEZE)  1 Container Oral BID BM  . insulin aspart  0-9 Units Subcutaneous TID WC  . insulin glargine  28 Units Subcutaneous QHS  . LORazepam  0.5 mg Oral Daily  . midodrine  10 mg Oral TID WC  . multivitamin  1 tablet Oral QHS  . pantoprazole  40 mg Oral QAC breakfast  . potassium chloride  20 mEq Oral BID  . sodium chloride 0.9 % 100 mL with sodium thiosulfate 25 g  25 g Intravenous Q1200  . traMADol  100 mg Oral BID  . 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 Has received daily HD for 7 days, now receiving 5 times weekly.  Continuing sodium thiosulfate and high flow oxygen therapy.  -continue midodrine TID -trend renal function panel and CBC  -s/p 7 consecutive days HD, now transitioning to 5 days/week until stabilization of wounds -will continue coumadin for now, appreciate cardiology recommendations who recommend continuing for now but may benefit from hearing from primary cardiologist Dr. Tenny Crawoss.   -continue high flow O2 therapy (venturi mask at 15LPM for 2 hours daily) -sodium thiosulfate at the end of each HD session -avoiding calcium containing meds and vit D, low Ca diaslysate, on KCL 20meq po bid.  Goal adjusted Ca 8.5-9.5 -use epo and blood transfusions to keep Hb 9.5-10.5, today Hb 9.1, avoid IV iron -aggressive wound care and pain control -will continue volume removal as tolerated with  BP -appreciate renal following  Necrosis of distal penis and foreskin--originally admitted with balanitis and now is s/p circumcision and debridement of penis 02/04/13 by urology.  No surgery for now per urology. S/p 7 day course of abx. WBC continues to trend upward, but his exam is improved from yesterday and he remains afebrile. Will hold off on abx now, but appreciate urology recs. -CBC in AM  A Fib: HR stable. He is in and out of afib. INR 2.44 today -coumadin per pharmacy  -continue amiodarone  DM-II: A1c 4.9 on 12/13/12, which may not be accurate due to dialysis. Patient takes Lantus 45U qHS at home.  -continue lantus to 28U qHS -SSI sensitive -continue cbg monitoring  Hypotension--chronic, stable. Improved after initiation of midodrine this admission. Volume status per HD. -continue midodrine TID  CHF with EF of ~25% per echo 01/2013 with NICM and ICD in place: Stable. 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 D/C to CIR if approved.     The patient does have a current PCP Evelena Peat, DO) and does need an [facility] hospital follow-up appointment after discharge.  The patient does not have transportation limitations that hinder transportation to clinic appointments.  Services Needed at time of discharge: Y = Yes, Blank = No PT: CIR  OT:   RN:   Equipment:   Other:     LOS: 11 days   Windell Hummingbird, MD 02/14/2013, 1:37 PM

## 2013-02-14 NOTE — Progress Notes (Signed)
ANTICOAGULATION CONSULT NOTE - Follow Up Consult  Pharmacy Consult:  Coumadin Indication: atrial fibrillation   Allergies  Allergen Reactions  . Argatroban Other (See Comments)    Ventricular tachycardia  . Ace Inhibitors     Acute renal failure with multiple trials  . Heparin     Thrombcytopenia. SRA positive. See miscellaneous test (SRA results)    Patient Measurements: Height: 6' (182.9 cm) Weight: 313 lb 7.9 oz (142.2 kg) IBW/kg (Calculated) : 77.6  Vital Signs: Temp: 97.2 F (36.2 C) (01/19 1350) Temp src: Oral (01/19 1350) BP: 89/63 mmHg (01/19 1350) Pulse Rate: 121 (01/19 1350)  Labs:  Recent Labs  02/12/13 0436 02/12/13 0932 02/13/13 0535 02/14/13 0419  HGB  --   --  8.9* 9.1*  HCT  --   --  27.8* 28.3*  PLT  --   --  336 357  LABPROT 32.9*  --  28.3* 25.7*  INR 3.37*  --  2.77* 2.44*  CREATININE  --  3.28* 2.95* 3.04*    Estimated Creatinine Clearance: 43.5 ml/min (by C-G formula based on Cr of 3.04).    Assessment: 57 YOM with history of calciphylaxis to continue on Coumadin for history of AFib.  There is some concerns with Coumadin worsening calciphylaxis in patients with ESRD.  However, patient's status is improving and it is okay to continue Coumadin per Nephrologist.  INR currently therapeutic; no bleeding reported.   Goal of Therapy:  INR 2-3 Monitor platelets by anticoagulation protocol: Yes   Plan:  - Coumadin 3mg  PO today - Daily PT / INR   . Demontray Franta D. Laney Potash, PharmD, BCPS Pager:  [phone] 02/14/2013, 2:03 PM

## 2013-02-14 NOTE — Progress Notes (Signed)
[facility] Progress Note  Subjective:   Doesn't think he is drinking much. Sat on side of the bed. Denies dizziness.  Objective Filed Vitals:   02/13/13 1415 02/13/13 1500 02/13/13 2057 02/14/13 0702  BP: 90/57 94/62 105/59 88/56  Pulse: 101 103 105 101  Temp: 98.1 F (36.7 C)  99 F (37.2 C) 97 F (36.1 C)  TempSrc: Oral  Oral Oral  Resp: 16  17 18   Height:      Weight:    143.9 kg (317 lb 3.9 oz)  SpO2: 94%  95% 98%   Physical Exam General: NAD supine on HD - just initiated goal 2.5 Heart: irreg irreg Lungs: coarse BS Abdomen: obese soft with dependent edema buttocks/thighs Extremities: ++ brawny edema bilaterally with blisters; left thigh wrapped Dialysis Access: right lower AVF  D[facility]  4.75h F180 Bath 2K/2.25Ca DW 143.5kg RFA AVF No heparin  EPO 22K Hect 6ug No Fe at center Recent labs: Hgb 8.9 12/29, 8.6 11/25 - on 22,000 Epo over a month; tsat 13% 12/18 got venofer 12/29 - ferritin 62 in October; iPTH 157 12/18 improved with normal Ca and P   Assessment/Plan: 1. Penile gangrene / L thigh gangrene / diffuse bilat LE discoloration with induration/pain - clinical presentation c/w calciphylaxis; prognosis guarded, but wounds improving, pain better controlled. Per UTD recommendations for calciphylaxis are:  1. Daily HD x 7d then 5-6 days/wk until wounds stabilize; 7th day of HD today. 2. PTH lowering - not an issue as PTH 56 ( note  iPTH was low at 56 when he was on 6 hectorol which was subsequently d/c 3. Na thiosulfate Q HD 4. Goal adj Ca 8.5-9.5; corr Ca 10.7 today, avoid Ca-containing meds and vit D, low Ca dialysate; on KCl 20 mEq PO bid to keep K over 4 (4.1 today) since the only low Ca bath we have is 2K.(I have changed to heart heathy diet 1/19 which does not restrict K and should help as well 5. Goal phos 2.5-3.5; now 4.0, use nonCa binders if needed - not on any at present 6. Avoid IV Fe 7. Use epo and transfusion to keep Hb  9.5-10.5 8. Oxygen Rx (ordered) 9. Aggressive wound care program and pain control 10. Will consider coumadin withdrawal if not improving with above measures; cardiology has seen; with this program in a case series, 6 of 7 patients with calciphylaxis recovered.  2. ESRD - extra HD as above. HD day 8 today - - normally TTS - plan next HD tomorrow -  Plan change to 5 day schedule 3. Chronic hypotension/volume overload - BP 88/58, below dry wt, with ++LE edema, cardiomyopathy w low BP's on midodrine. Continue vol removal, with goal 2-2.5 kg per Rx maintaining SBP >80; repeat Echo 20 - 15% 02/13/13; weights not declining bec; seems to drink back all the fluid that is taken off 4. Anemia/CKD - Hgb 9.1 on Aranesp 200 mcg on Thurs, no IV Fe due to #1, as above. 5. NICM / bivent HF / LVEF 25% / AICD 6. Pacemaker 7. Morbid obesity 8. Nutrition - heart healthy diet + prostat and resource supplements - alb low  Sheffield Slider, PA-C [facility] Beeper [phone] 02/14/2013,8:19 AM  LOS: 11 days    Additional Objective Labs: Basic Metabolic Panel:  Recent Labs Lab 02/12/13 0932 02/13/13 0535 02/14/13 0419  NA 139 140 141  K 3.8 4.1 4.1  CL 91* 91* 92*  CO2 22 21 20   GLUCOSE  123* 175* 156*  BUN 18 17 18   CREATININE 3.28* 2.95* 3.04*  CALCIUM 8.3* 8.5 8.9  PHOS 4.5 3.9 4.0   Liver Function Tests:  Recent Labs Lab 02/12/13 0932 02/13/13 0535 02/14/13 0419  ALBUMIN 1.8* 1.7* 1.7*   CBC:  Recent Labs Lab 02/08/13 0440 02/11/13 1352 02/13/13 0535 02/14/13 0419  WBC 11.1* 10.8* 11.7* 13.3*  NEUTROABS  --   --   --  10.8*  HGB 9.4* 9.7* 8.9* 9.1*  HCT 29.4* 29.6* 27.8* 28.3*  MCV 88.6 88.9 88.3 88.7  PLT 368 329 336 357  CBG:  Recent Labs Lab 02/13/13 0712 02/13/13 1050 02/13/13 1646 02/13/13 2033 02/14/13 0551  GLUCAP 156* 137* 159* 251* 130*  Medications:   . amiodarone  200 mg Oral BID  . colchicine  0.6 mg Oral Q3 days  . darbepoetin (ARANESP)  injection - DIALYSIS  200 mcg Intravenous Q Thu-HD  . diclofenac sodium  2 g Topical QID  . Febuxostat  1 tablet Oral Daily  . feeding supplement (PRO-STAT SUGAR FREE 64)  30 mL Oral BID WC  . feeding supplement (RESOURCE BREEZE)  1 Container Oral BID BM  . insulin aspart  0-9 Units Subcutaneous TID WC  . insulin glargine  28 Units Subcutaneous QHS  . LORazepam  0.5 mg Oral Daily  . midodrine  10 mg Oral TID WC  . multivitamin  1 tablet Oral QHS  . pantoprazole  40 mg Oral QAC breakfast  . potassium chloride  20 mEq Oral BID  . sodium chloride 0.9 % 100 mL with sodium thiosulfate 25 g  25 g Intravenous Q1200  . traMADol  100 mg Oral BID  . Warfarin - Pharmacist Dosing Inpatient   Does not apply [phone]

## 2013-02-14 NOTE — Progress Notes (Signed)
Internal Medicine Attending  Date: 02/14/2013  Patient name: Adam [name] Medical record number: [id number] Date of birth: May 18, 1965 Age: 48 y.o. Gender: male  I saw and evaluated the patient. I reviewed the resident's note by Dr. Darci Needle and I agree with the resident's findings and plans as documented in her note.

## 2013-02-14 NOTE — Progress Notes (Signed)
I have seen and examined this patient and agree with plan as outlined by M. Lenox Ponds A,MD 02/14/2013 12:06 PM

## 2013-02-14 NOTE — Progress Notes (Signed)
Patient ID: Adam [name], male   DOB: 10/07/1965, 48 y.o.   MRN: [id number]    Primary cardiologist: Dr. Dietrich Pates  Subjective:    Stable. Had HD today.  He is probably in atrial fibrillation though telemetry baseline is difficult.    Objective:   Temp:  [97 F (36.1 C)-99 F (37.2 C)] 97.2 F (36.2 C) (01/19 1350) Pulse Rate:  [90-121] 121 (01/19 1350) Resp:  [16-20] 18 (01/19 1350) BP: (62-105)/(34-75) 89/63 mmHg (01/19 1350) SpO2:  [92 %-98 %] 93 % (01/19 1350) Weight:  [142.2 kg (313 lb 7.9 oz)-143.9 kg (317 lb 3.9 oz)] 142.2 kg (313 lb 7.9 oz) (01/19 1225) Last BM Date: 02/11/13  Filed Weights   02/14/13 0702 02/14/13 0805 02/14/13 1225  Weight: 143.9 kg (317 lb 3.9 oz) 143.7 kg (316 lb 12.8 oz) 142.2 kg (313 lb 7.9 oz)    Intake/Output Summary (Last 24 hours) at 02/14/13 1411 Last data filed at 02/14/13 1300  Gross per 24 hour  Intake    240 ml  Output   3300 ml  Net  -3060 ml    Exam:  General: Obese, no distress.  Lungs: Decreased breath sounds but clear.  Cardiac: Irregular, no gallop. JVP 10 cm.   Abdomen: Protuberant.  Extremities: Thickened skin and nodularity.   Lab Results:  Basic Metabolic Panel:  Recent Labs Lab 02/12/13 0932 02/13/13 0535 02/14/13 0419  NA 139 140 141  K 3.8 4.1 4.1  CL 91* 91* 92*  CO2 22 21 20   GLUCOSE 123* 175* 156*  BUN 18 17 18   CREATININE 3.28* 2.95* 3.04*  CALCIUM 8.3* 8.5 8.9    Liver Function Tests:  Recent Labs Lab 02/12/13 0932 02/13/13 0535 02/14/13 0419  ALBUMIN 1.8* 1.7* 1.7*    CBC:  Recent Labs Lab 02/11/13 1352 02/13/13 0535 02/14/13 0419  WBC 10.8* 11.7* 13.3*  HGB 9.7* 8.9* 9.1*  HCT 29.6* 27.8* 28.3*  MCV 88.9 88.3 88.7  PLT 329 336 357    Coagulation:  Recent Labs Lab 02/12/13 0436 02/13/13 0535 02/14/13 0419  INR 3.37* 2.77* 2.44*     Medications:   Scheduled Medications: . amiodarone  200 mg Oral BID  . colchicine  0.6 mg Oral Q3 days  . darbepoetin  (ARANESP) injection - DIALYSIS  200 mcg Intravenous Q Thu-HD  . diclofenac sodium  2 g Topical QID  . Febuxostat  1 tablet Oral Daily  . feeding supplement (PRO-STAT SUGAR FREE 64)  30 mL Oral BID WC  . feeding supplement (RESOURCE BREEZE)  1 Container Oral BID BM  . insulin aspart  0-9 Units Subcutaneous TID WC  . insulin glargine  28 Units Subcutaneous QHS  . LORazepam  0.5 mg Oral Daily  . midodrine  10 mg Oral TID WC  . multivitamin  1 tablet Oral QHS  . pantoprazole  40 mg Oral QAC breakfast  . potassium chloride  20 mEq Oral BID  . sodium chloride 0.9 % 100 mL with sodium thiosulfate 25 g  25 g Intravenous Q1200  . traMADol  100 mg Oral BID  . warfarin  3 mg Oral ONCE-1800  . Warfarin - Pharmacist Dosing Inpatient   Does not apply q1800    PRN Medications: acetaminophen, fluticasone, HYDROcodone-acetaminophen, loratadine, white petrolatum   Assessment:   1. 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 Myra Gianotti this admission   2. 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 UTI, UCx multiple morphs   4. Paroxysmal atrial flutter/atrial fibrillation dx 08/2012.  Currently appears to be in atrial fibrillation.   5. Paroxysmal VT 08/2012 in setting of long QT/argatroban, not on BB due to hypotension with dialysis   6. Chronic combined systolic and diastolic CHF/NICM EF 25% in 12/2011, [employer] AICD placed 2011, cath 2006 normal coronaries, not on ACEI due to hypotension   7. ESRD on HD since August   8. Severe hypoalbuminemia 1.9   9. Acute on chronic anemia - admit 6.7, s/p transfusion   10. Morbid obesity Body mass index is 42.15 kg/(m^2).   11. Probable depression   Plan/Discussion:    Regarding anticoagulation, Dr. Diona BrownerMcDowell spoke with pharmacy over the weekend. While Coumadin can be associated with worsening calciphylaxis, the actual risk of this occurring appears to be low.  There is no other good  option for anticoagulation and CHADSVASC = 3.  Therefore, coumadin has been restarted.  Patient does appear to be in atrial fibrillation currently (was in NSR at admission so suspect paroxysmal).  Continue amiodarone at current dose.    In the future, please call with questions and he will followup with Dr. Tenny Crawoss after discharge.   Marca AnconaDalton McLean 02/14/2013 2:15 PM

## 2013-02-14 NOTE — Progress Notes (Signed)
Rehab admissions - Evaluated for possible admission. Noted patient with fatigue and pain after PT today.  Also had HD today earlier.  I will follow up with patient in the am to discuss rehab venues and to consider possible acute inpatient rehab admission.  Call me for questions.  #[phone]

## 2013-02-14 NOTE — Procedures (Signed)
Patient was seen on dialysis and the procedure was supervised. BFR 400 Via RAVF BP is 82/61.  Patient appears to be tolerating treatment well despite low BP.  Cont with aggressive HD to help with volume removal and calciphylaxis

## 2013-02-14 NOTE — Progress Notes (Signed)
Physical Therapy Treatment Patient Details Name: Adam [name] MRN: [id number] DOB: December 25, 1965 Today's Date: 02/14/2013 Time: 7915-0569 PT Time Calculation (min): 23 min  PT Assessment / Plan / Recommendation  History of Present Illness Pt with ESRD and CHF.   PT Comments   Pt just returned back from dialysis but agreeable to participate in therapy.  Transferred bed>recliner with +2 assist.  Pt did not want to attempt ambulation today due to fatigue & LE pain but agreeable to attempt next PT session. (RW in room).   Cont with current POC.     Follow Up Recommendations  CIR;Supervision/Assistance - 24 hour     Does the patient have the potential to tolerate intense rehabilitation     Barriers to Discharge        Equipment Recommendations  Wheelchair (measurements PT);Wheelchair cushion (measurements PT);Rolling walker with 5" wheels    Recommendations for Other Services Rehab consult  Frequency Min 3X/week   Progress towards PT Goals Progress towards PT goals: Progressing toward goals  Plan Current plan remains appropriate    Precautions / Restrictions Restrictions Weight Bearing Restrictions: No   Pertinent Vitals/Pain C/o bil LE pain L>R.      Mobility  Bed Mobility Overal bed mobility: Needs Assistance Bed Mobility: Supine to Sit Supine to sit: Mod assist;HOB elevated General bed mobility comments: HOB elevated & use of rail to sit upright.  (A) with use of draw pad to pivot hips around & scoot closer to EOB.   Transfers Overall transfer level: Needs assistance Equipment used: Rolling walker (2 wheeled) Transfers: Sit to/from [employer] Sit to Stand: +2 physical assistance;Mod assist Stand pivot transfers: +2 physical assistance;Mod assist General transfer comment: Height of bed elevated.  (A) to power up to standing, balance, & controlled descent.  Pt used back of chair for bil UE support due to no RW available but he was able to take pivotal steps  around from bed>recliner.        PT Goals (current goals can now be found in the care plan section) Acute Rehab PT Goals PT Goal Formulation: With patient Time For Goal Achievement: 02/17/13 Potential to Achieve Goals: Good  Visit Information  Last PT Received On: 02/14/13 Assistance Needed: +2 History of Present Illness: Pt with ESRD and CHF.    Subjective Data      Cognition  Cognition Arousal/Alertness: Awake/alert Behavior During Therapy: WFL for tasks assessed/performed Overall Cognitive Status: Within Functional Limits for tasks assessed    Balance     End of Session     GP     Lara Mulch 02/14/2013, 2:40 PM  Verdell Face, PTA [phone] 02/14/2013

## 2013-02-15 LAB — GLUCOSE, CAPILLARY
GLUCOSE-CAPILLARY: 130 mg/dL — AB (ref 70–99)
Glucose-Capillary: 87 mg/dL (ref 70–99)
Glucose-Capillary: 79 mg/dL (ref 70–99)
Glucose-Capillary: 138 mg/dL — ABNORMAL HIGH (ref 70–99)

## 2013-02-15 LAB — RENAL FUNCTION PANEL
ALBUMIN: 1.8 g/dL — AB (ref 3.5–5.2)
BUN: 17 mg/dL (ref 6–23)
CO2: 23 mEq/L (ref 19–32)
Calcium: 8.8 mg/dL (ref 8.4–10.5)
Chloride: 92 mEq/L — ABNORMAL LOW (ref 96–112)
Creatinine, Ser: 3.08 mg/dL — ABNORMAL HIGH (ref 0.50–1.35)
GFR calc non Af Amer: 22 mL/min — ABNORMAL LOW (ref >90)
GFR, EST AFRICAN AMERICAN: 26 mL/min — AB (ref >90)
Glucose, Bld: 88 mg/dL (ref 70–99)
POTASSIUM: 3.8 meq/L (ref 3.7–5.3)
Phosphorus: 4.2 mg/dL (ref 2.3–4.6)
SODIUM: 138 meq/L (ref 137–147)

## 2013-02-15 LAB — CBC
HCT: 26.6 % — ABNORMAL LOW (ref 39.0–52.0)
Hemoglobin: 8.6 g/dL — ABNORMAL LOW (ref 13.0–17.0)
MCH: 28.7 pg (ref 26.0–34.0)
MCHC: 32.3 g/dL (ref 30.0–36.0)
MCV: 88.7 fL (ref 78.0–100.0)
Platelets: 355 10*3/uL (ref 150–400)
RBC: 3 MIL/uL — ABNORMAL LOW (ref 4.22–5.81)
RDW: 19 % — AB (ref 11.5–15.5)
WBC: 12.5 10*3/uL — AB (ref 4.0–10.5)

## 2013-02-15 LAB — PROTIME-INR
INR: 2.64 — AB (ref 0.00–1.49)
Prothrombin Time: 27.3 seconds — ABNORMAL HIGH (ref 11.6–15.2)

## 2013-02-15 MED ORDER — SODIUM CHLORIDE 0.9 % IJ SOLN
3.0000 mL | Freq: Two times a day (BID) | INTRAMUSCULAR | Status: DC
  Administered 2013-02-15 – 2013-02-16 (×3): 3 mL via INTRAVENOUS

## 2013-02-15 NOTE — Progress Notes (Signed)
Lab call RN lab post wrong CBC result this AM. Lab posting correct results @ this time. MD txt paged

## 2013-02-15 NOTE — Progress Notes (Signed)
Subjective: Adam [name] is more worried this morning regarding his prognosis. Overall, his pain is stable and well controlled. He was seen by urology this morning.   Objective: Vital signs in last 24 hours: Filed Vitals:   02/15/13 1000 02/15/13 1030 02/15/13 1100 02/15/13 1108  BP: 91/62 104/68 95/69 95/72   Pulse: 114 116 95 94  Temp:    97.4 F (36.3 C)  TempSrc:    Oral  Resp: 18   18  Height:      Weight:    308 lb 6.8 oz (139.9 kg)  SpO2:    100%   Weight change: 2 lb 13.9 oz (1.3 kg)  Intake/Output Summary (Last 24 hours) at 02/15/13 1159 Last data filed at 02/15/13 1108  Gross per 24 hour  Intake    720 ml  Output   3950 ml  Net  -3230 ml   Physical Exam General: lying flat in bed in HD HEENT: NCAT, vision grossly intact Cardiac: RRR Pulm: clear to auscultation bilaterally, no wheezes, rales, or rhonchi Abd: soft, nontender, obese, nondistended, BS present Ext: indurated and hyperpigmented b/l lower extremities up to thighs, multiple erosions/skin breakdown w/ scant serous drainage on bilateral inner thighs; loose dressings in place that are clean and dry Neurologic: alert & oriented X 3, moving all extremities spontaneously GU: circumscised penis with darkened hard area on tip, non-tender to palpation, some mild white/yellow drainage to shaft, though no foul odor.   Lab Results: Basic Metabolic Panel:  Recent Labs Lab 02/14/13 0419 02/15/13 0420  NA 141 138  K 4.1 3.8  CL 92* 92*  CO2 20 23  GLUCOSE 156* 88  BUN 18 17  CREATININE 3.04* 3.08*  CALCIUM 8.9 8.8  PHOS 4.0 4.2   Liver Function Tests:  Recent Labs Lab 02/14/13 0419 02/15/13 0420  ALBUMIN 1.7* 1.8*   CBC:  Recent Labs Lab 02/14/13 0419 02/15/13 0420  WBC 13.3* 8.7  NEUTROABS 10.8*  --   HGB 9.1* 12.7*  HCT 28.3* 40.2  MCV 88.7 89.9  PLT 357 268   CBG:  Recent Labs Lab 02/13/13 1646 02/13/13 2033 02/14/13 0551 02/14/13 1642 02/14/13 2139 02/15/13 0646  GLUCAP 159*  251* 130* 204* 166* 87   Coagulation:  Recent Labs Lab 02/12/13 0436 02/13/13 0535 02/14/13 0419 02/15/13 0420  LABPROT 32.9* 28.3* 25.7* 27.3*  INR 3.37* 2.77* 2.44* 2.64*   Urine Drug Screen: Drugs of Abuse     Component Value Date/Time   LABOPIA NEG 03/17/2006 1455   COCAINSCRNUR NEG 03/17/2006 1455   LABBENZ NEG 03/17/2006 1455   AMPHETMU NEG 03/17/2006 1455    Micro Results: Recent Results (from the past 240 hour(s))  URINE CULTURE     Status: None   Collection Time    02/06/13  6:11 AM      Result Value Range Status   Specimen Description URINE, CATHETERIZED   Final   Special Requests NONE   Final   Culture  Setup Time     Final   Value: 02/06/2013 18:02     Performed at [facility] Count     Final   Value: >=100,000 COLONIES/ML     Performed at [facility]   Culture     Final   Value: Multiple bacterial morphotypes present, none predominant. Suggest appropriate recollection if clinically indicated.     Performed at [facility]   Report Status 02/08/2013 FINAL   Final  GC/CHLAMYDIA PROBE AMP  Status: None   Collection Time    02/06/13  6:18 AM      Result Value Range Status   CT Probe RNA NEGATIVE  NEGATIVE Final   GC Probe RNA NEGATIVE  NEGATIVE Final   Comment: (NOTE)                                                                                               **Normal Reference Range: Negative**          Assay performed using the Gen-Probe APTIMA COMBO2 (R) Assay.     Acceptable specimen types for this assay include APTIMA Swabs (Unisex,     endocervical, urethral, or vaginal), first void urine, and ThinPrep     liquid based cytology samples.     Performed at [facility]   Medications: I have reviewed the patient's current medications. Scheduled Meds: . amiodarone  200 mg Oral BID  . colchicine  0.6 mg Oral Q3 days  . darbepoetin (ARANESP) injection - DIALYSIS  200 mcg Intravenous Q Thu-HD  . diclofenac  sodium  2 g Topical QID  . Febuxostat  1 tablet Oral Daily  . feeding supplement (PRO-STAT SUGAR FREE 64)  30 mL Oral BID WC  . feeding supplement (RESOURCE BREEZE)  1 Container Oral BID BM  . insulin aspart  0-9 Units Subcutaneous TID WC  . insulin glargine  28 Units Subcutaneous QHS  . LORazepam  0.5 mg Oral Daily  . midodrine  10 mg Oral TID WC  . multivitamin  1 tablet Oral QHS  . pantoprazole  40 mg Oral QAC breakfast  . potassium chloride  20 mEq Oral BID  . sodium chloride 0.9 % 100 mL with sodium thiosulfate 25 g  25 g Intravenous Q1200  . 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 Also considering coumadin necrosis. -discontinue warfarin today - had a long conversation w/ nephrology today regarding the risk/benefit of doing this; patient was counseled on the risk of stroke and he is amenable to discontinuation of coumadin for at least a short course; I attempted to call pt's cardiology Dr Tenny Crawoss [phone]) to discuss this, though she is out of the office so I will need to try again later this week -discussed patient's case with surgery and they are willing to perform a punch biopsy if we think it necessary; we will discuss and consider this option (will contact once we decide on care plan- [phone])  Continue treatment plan for calciphylaxis: -continue midodrine TID -trend renal function panel and CBC  -s/p 7 consecutive days HD, now receiving 5 days/week until stabilization of wounds (day 2/5) -continue high flow O2 therapy (venturi mask at 15LPM for 2 hours daily) -sodium thiosulfate at the end of each HD session -avoiding calcium containing meds and vit D, low Ca diaslysate, on KCL 20meq po bid.  Goal adjusted Ca 8.5-9.5 -use epo and blood transfusions to keep Hb  9.5-10.5, avoid IV iron -  aggressive wound care and pain control -will continue volume removal as tolerated with BP -appreciate renal following  Necrosis of distal penis and foreskin--originally admitted with balanitis and now is s/p circumcision and debridement of penis 02/04/13 by urology.  No surgery for now per urology. S/p 7 day course of abx. WBC count trended down this morning. No signs of infection at this time.  -Urology evaluated patient this morning and thought that perhaps the necrosis of glans is spreading to his urethra and possibly causing urinary retention as patient has not had any UOP over the last few days; we will bladder scan today, though pt's decreased UOP may be because of receiving daily HD for the last 10 days -CBC in AM  A Fib: HR stable. He is in and out of afib. INR 2.6 today. Risks and benefits of continuing coumadin were weight in conjunction with nephrology and we agreed that discontinuing coumadin for now would be the best option for patient as his skin necrosis is somewhat atypical for both coumadin necrosis and calciphylaxis, however we cannot be certain of the etiology of his current condition. We would like to treat both conditions for now. Patient is in agreement with our plan. I will plan to touch base with patient's cardiologist later this week. -discontinue coumadin -continue amiodarone  DM-II: A1c 4.9 on 12/13/12, which may not be accurate due to dialysis. Patient takes Lantus 45U qHS at home.  -continue lantus to 28U qHS -SSI sensitive -continue cbg monitoring  Hypotension--chronic, stable. Improved after initiation of midodrine this admission. Volume status per HD. -continue midodrine TID  CHF with EF of ~25% per echo 01/2013 with NICM and ICD in place: Stable. 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 D/C to CIR if approved.     The patient does have a current PCP Evelena Peat, DO) and does need an [facility] hospital follow-up appointment after discharge.  The patient does not have transportation limitations that hinder transportation to clinic appointments.  Services Needed at time of discharge: Y = Yes, Blank = No PT: CIR  OT:   RN:   Equipment:   Other:     LOS: 12 days   Windell Hummingbird, MD 02/15/2013, 11:59 AM

## 2013-02-15 NOTE — Progress Notes (Signed)
Internal Medicine Attending  Date: 02/15/2013  Patient name: Adam [name] Medical record number: [id number] Date of birth: 1965/02/05 Age: 48 y.o. Gender: male  I saw and evaluated the patient, and discussed his care on A.M rounds with housestaff.  I reviewed the resident's note by Dr. Darci Needle and I agree with the resident's findings and plans as documented in her note.

## 2013-02-15 NOTE — Progress Notes (Signed)
Pt states he woke up in a sweat. Nt check CBG and temp. WNL. Pt states he wakes up at night sweating since he has been in the HP. Will continue to monitor Pt

## 2013-02-15 NOTE — Progress Notes (Signed)
Rehab admissions - Met with patient and gave him rehab brochures.  He tells [place] that he has a girlfriend at home who can assist.  He says he does not like the idea of having therapies before dialysis.  He prefers dialysis early mornings.  At home he is on dialysis by 6 am.  He is motivated to go home and he does not want to go to a SNF.  Normally rehab does therapies during the day and then does dialysis in the pms.  I asked patient to think about rehab over night and will check back with him tomorrow.  Call me for questions.  #[phone]

## 2013-02-15 NOTE — Progress Notes (Signed)
Pt rec. Back from HD. Wound care doen, PRN giving for pain. Pt O4x no complaints of sob. IV date 1-13, RN attempted unsuccessfully, IV team paged. Will continue to monitor

## 2013-02-15 NOTE — Progress Notes (Addendum)
Pt current VTE is SCD which he can not wear d/t wounds.   Vent mask applied @ 1900

## 2013-02-15 NOTE — Progress Notes (Signed)
Subjective: The patient reports foley out. No urine made. No penile pain. No penile drainage.   Objective: Vital signs in last 24 hours: Temp:  [97.2 F (36.2 C)-98.3 F (36.8 C)] 97.3 F (36.3 C) (01/20 0706) Pulse Rate:  [90-124] 114 (01/20 0800) Resp:  [12-21] 20 (01/20 0800) BP: (62-121)/(34-78) 96/68 mmHg (01/20 0800) SpO2:  [92 %-94 %] 92 % (01/20 0714) Weight:  [140.4 kg (309 lb 8.4 oz)-143 kg (315 lb 4.1 oz)] 143 kg (315 lb 4.1 oz) (01/20 0706)A  Intake/Output from previous day: 01/19 0701 - 01/20 0700 In: 720 [P.O.:720] Out: 1450  Intake/Output this shift:    Past Medical History  Diagnosis Date  . Other and unspecified hyperlipidemia   . Insomnia, unspecified   . Obstructive sleep apnea (adult) (pediatric)   . Allergic rhinitis, cause unspecified   . Nonischemic cardiomyopathy     a. s/p St. Jude ICD 2011. b. Normal cors 2006. c. EF 25% in 12/2011.  Marland Kitchen Unspecified essential hypertension   . Type II or unspecified type diabetes mellitus without mention of complication, not stated as uncontrolled   . CKD (chronic kidney disease) stage 3, GFR 30-59 ml/min   . Esophageal reflux   . Morbid obesity   . Dysuria   . Tobacco use disorder   . Antral ulcer   . Renal azotemia   . Arthritis     Gout w/hyperuricemia  . Morbid obesity   . Automatic implantable cardioverter-defibrillator in situ   . Calciphylaxis   . PAF (paroxysmal atrial fibrillation)     a. Dx 08/2012.  . Paroxysmal atrial flutter     a. Dx 08/2012.  . Paroxysmal VT     a. 08/2012 in setting of long QT, argatroban  . Chronic combined systolic and diastolic CHF (congestive heart failure)     a. Due to NICM.    Physical Exam:  Lungs - Normal respiratory effort, chest expands symmetrically.  Abdomen - Soft, non-tender & non-distended. GU: Glans: Blackened, hardened eschar over R and L sides of glans covering 75% glans. Circumcision sutures completely gone. Dressing changed.  Lab Results:  Recent  Labs  02/13/13 0535 02/14/13 0419 02/15/13 0420  WBC 11.7* 13.3* 8.7  HGB 8.9* 9.1* 12.7*  HCT 27.8* 28.3* 40.2   BMET  Recent Labs  02/14/13 0419 02/15/13 0420  NA 141 138  K 4.1 3.8  CL 92* 92*  CO2 20 23  GLUCOSE 156* 88  BUN 18 17  CREATININE 3.04* 3.08*  CALCIUM 8.9 8.8   No results found for this basename: LABURIN,  in the last 72 hours Results for orders placed during the hospital encounter of 02/03/13  CULTURE, BLOOD (ROUTINE X 2)     Status: None   Collection Time    02/03/13  5:45 AM      Result Value Range Status   Specimen Description BLOOD ARM LEFT   Final   Special Requests BOTTLES DRAWN AEROBIC AND ANAEROBIC 10CC   Final   Culture  Setup Time     Final   Value: 02/03/2013 13:55     Performed at [facility]   Culture     Final   Value: NO GROWTH 5 DAYS     Performed at [facility]   Report Status 02/09/2013 FINAL   Final  CULTURE, BLOOD (ROUTINE X 2)     Status: None   Collection Time    02/03/13  5:52 AM      Result Value  Range Status   Specimen Description BLOOD HAND LEFT   Final   Special Requests BOTTLES DRAWN AEROBIC ONLY 8CC   Final   Culture  Setup Time     Final   Value: 02/03/2013 13:54     Performed at [facility]   Culture     Final   Value: NO GROWTH 5 DAYS     Performed at [facility]   Report Status 02/09/2013 FINAL   Final  MRSA PCR SCREENING     Status: Abnormal   Collection Time    02/03/13 12:07 PM      Result Value Range Status   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 It is not     intended to diagnose MRSA     infection nor to guide or     monitor treatment for     MRSA infections.     RESULT CALLED TO, READ BACK BY AND VERIFIED WITH:     A. BRAKE RN 14:30 02/03/13 ([email or web address])  WOUND CULTURE     Status:  None   Collection Time    02/04/13 10:38 AM      Result Value Range Status   Specimen Description WOUND PENIS   Final   Special Requests NONE   Final   Gram Stain     Final   Value: FEW WBC PRESENT,BOTH PMN AND MONONUCLEAR     RARE SQUAMOUS EPITHELIAL CELLS PRESENT     MODERATE GRAM NEGATIVE RODS     MODERATE GRAM POSITIVE COCCI IN PAIRS     Performed at [facility]   Culture     Final   Value: MULTIPLE ORGANISMS PRESENT, NONE PREDOMINANT     Note: NO STAPHYLOCOCCUS AUREUS ISOLATED NO GROUP A STREP (S.PYOGENES) ISOLATED     Performed at [facility]   Report Status 02/06/2013 FINAL   Final  URINE CULTURE     Status: None   Collection Time    02/06/13  6:11 AM      Result Value Range Status   Specimen Description URINE, CATHETERIZED   Final   Special Requests NONE   Final   Culture  Setup Time     Final   Value: 02/06/2013 18:02     Performed at [facility] Count     Final   Value: >=100,000 COLONIES/ML     Performed at [facility]   Culture     Final   Value: Multiple bacterial morphotypes present, none predominant. Suggest appropriate recollection if clinically indicated.     Performed at [facility]   Report Status 02/08/2013 FINAL   Final  GC/CHLAMYDIA PROBE AMP     Status: None   Collection Time    02/06/13  6:18 AM      Result Value Range Status   CT Probe RNA NEGATIVE  NEGATIVE Final   GC Probe RNA NEGATIVE  NEGATIVE Final   Comment: (NOTE)                                                                                               **  Normal Reference Range: Negative**          Assay performed using the Gen-Probe APTIMA COMBO2 (R) Assay.     Acceptable specimen types for this assay include APTIMA Swabs (Unisex,     endocervical, urethral, or vaginal), first void urine, and ThinPrep     liquid based cytology samples.     [facility]    Studies/Results: @RISRSLT24 @  Assessment: Severe  vasculopath with dry, non-painful ischemia of the penile glans wit eschar formation. He has a hx of making some urine each day, but none since the foley was removed ( / reason). My concern is for extending vascular necrosis of the glans, extending to the  Urethra, with possible urinary retention ( even if it is poor quality). He is obese, and I cannot palpate his bladder. He will need pvr by u/s, and may need foley cath ( i/o); and possible future s-p tube ( concerned with wound healing, however).  Plan: 1. Wet-to dry dressings to continue 2. pvr by u/s,.  Annya Lizana I 02/15/2013, 8:25 AM

## 2013-02-15 NOTE — Progress Notes (Signed)
Occupational Therapy Treatment Patient Details Name: Adam [name] J Douglass MRN: [id number] DOB: [birth year] Today's Date: 02/15/2013 Time: [id number] OT Time Calculation (min): 23 min  OT Assessment / Plan / Recommendation  History of present illness Pt with ESRD and CHF.   OT comments  Pt did well in OT today getting up on feet easier from raised bed.  Pt motivated and excited about standing and doing adls in standing.  Pt does remain very weak.  Follow Up Recommendations  CIR;Supervision/Assistance - 24 hour    Barriers to Discharge       Equipment Recommendations  None recommended by OT;Other (comment)    Recommendations for Other Services    Frequency Min 2X/week   Progress towards OT Goals Progress towards OT goals: Progressing toward goals  Plan Discharge plan remains appropriate    Precautions / Restrictions Precautions Precautions: Fall Restrictions Weight Bearing Restrictions: No   Pertinent Vitals/Pain Pain 6/10 in L leg.    ADL  Eating/Feeding: Performed;Independent Where Assessed - Eating/Feeding: Edge of bed Upper Body Bathing: Simulated;Supervision/safety;Set up Where Assessed - Upper Body Bathing: Unsupported sitting Lower Body Dressing: Performed;Maximal assistance Where Assessed - Lower Body Dressing: Supported sit to stand Toilet Transfer: Performed;Moderate assistance;Other (comment) (from high surface) Toilet Transfer Method: Sit to stand Toilet Transfer Equipment: Comfort height toilet Toileting - Clothing Manipulation and Hygiene: Maximal assistance;Other (comment) (difficult for pt to let go of walker to clean self ) Where Assessed - Toileting Clothing Manipulation and Hygiene: Standing Equipment Used: Rolling walker Transfers/Ambulation Related to ADLs: Pt initially declined sit to stand but then agreed to stand EOB to prepare to groom at sink. With bed elevated, pt stood with min assist and walker. ADL Comments: Pt needs to be introduced to AE to make LE  dressing easier.  Pt can pull leg up on the bed but the condition of the skin on his leg with sores makes this difficult.    OT Diagnosis:    OT Problem List:   OT Treatment Interventions:     OT Goals(current goals can now be found in the care plan section) Acute Rehab OT Goals Patient Stated Goal: to go home ADL Goals Pt Will Perform Lower Body Bathing: with mod assist;with adaptive equipment;sitting/lateral leans;sit to/from stand Pt Will Perform Lower Body Dressing: with max assist;with mod assist;with adaptive equipment;sitting/lateral leans Pt Will Transfer to Toilet: with total assist;with max assist;bedside commode Pt Will Perform Toileting - Clothing Manipulation and hygiene: with max assist;with mod assist;sitting/lateral leans;sit to/from stand Additional ADL Goal #1: Pt will complete bed mobility with max - mod A +1 to sit EOB in prep for ADLs  Visit Information  Last OT Received On: 02/15/13 Assistance Needed: +1 History of Present Illness: Pt with ESRD and CHF.    Subjective Data      Prior Functioning       Cognition  Cognition Arousal/Alertness: Awake/alert Behavior During Therapy: WFL for tasks assessed/performed Overall Cognitive Status: Within Functional Limits for tasks assessed    Mobility  Bed Mobility Overal bed mobility: Needs Assistance Bed Mobility: Supine to Sit Supine to sit: Mod assist;HOB elevated Sit to supine: Mod assist (pt pulls himself up in bed.) General bed mobility comments: Pt able to get to side of bed with just use of bedrail today and no other assist. Transfers Overall transfer level: Needs assistance Equipment used: Rolling walker (2 wheeled) Transfers: Sit to/from Stand Sit to Stand: Min assist;From elevated surface General transfer comment: With the height of bed raised, pt able  to push up from bedrail with +1 min assist to get into standing.  Standing tasks completed for 2 -3 minutes with min guard.    Exercises       Balance Balance Overall balance assessment: Needs assistance Sitting-balance support: No upper extremity supported;Feet supported Sitting balance-Leahy Scale: Good Sitting balance - Comments: Pt was able to take more  challenges to sitting balance today reaching on bed for adl times in both directions. Standing balance support: Bilateral upper extremity supported;During functional activity Standing balance-Leahy Scale: Poor Standing balance comment: Pt stood today with use of walker. Pt unable to let go of walker during standing tasks.  End of Session OT - End of Session Activity Tolerance: Patient limited by pain Patient left: in bed;with call bell/phone within reach;with family/visitor present Nurse Communication: Mobility status  GO     Hope Budds 02/15/2013, 1:27 PM [phone]

## 2013-02-15 NOTE — Progress Notes (Signed)
Bladder scan per order 65cc.

## 2013-02-15 NOTE — Progress Notes (Signed)
[facility] Progress Note  Subjective:   Scared of dying. Doesn't understand why he got this problem. Worried because no one has told him it is getting better.  Objective Filed Vitals:   02/15/13 0714 02/15/13 0730 02/15/13 0800 02/15/13 0830  BP: 99/78 106/69 96/68 104/68  Pulse: 116 110 114 116  Temp:      TempSrc:      Resp: 17 12 20 18   Height:      Weight:      SpO2: 92%      Physical Exam General: NAD on HD goal 3 L Heart: irreg irreg rate variable - afib 106 - 120 Lungs: no wheezes or rales Abdomen: obese soft Extremities: ++ edema with blisters Dialysis Access: right lower AVF Qb 400  [facility]  4.75h F180 Bath 2K/2.25Ca DW 143.5kg RFA AVF No heparin  EPO 22K Hect 6ug No Fe at center  Recent labs: Hgb 8.9 12/29, 8.6 11/25 - on 22,000 Epo over a month; tsat 13% 12/18 got venofer 12/29 - ferritin 62 in October; iPTH 157 12/18 improved with normal Ca and P   Assessment/Plan:  1. Penile gangrene / L thigh gangrene / diffuse bilat LE discoloration with induration/pain - clinical presentation c/w calciphylaxis; prognosis guarded, but wounds stable; pain controlled. Path 1/09 -"benign skin necrosis"; Coumadin could be culprit. Have been following up-to-date Rx for caclciphylaxis but normal iPTH and P suggest not common with calciphylaxis.  S/p 7 days HD, now 5 x weekly with Na thiosulfate Q HD; coumadin restarted 1/19 -  Will stop and consider as an allergy for now 2. ESRD -  Plan change to 5 day schedule MT ThFSa - need to lower volume as well due to low BP K 3.8 4 K bath today 3. Chronic hypotension/volume overload - with ++LE edema, cardiomyopathy w low BP's on midodrine. Continue vol removal, with goal 2-2.5 kg per Rx maintaining SBP >80; repeat Echo 20 - 15% 02/13/13; weights not declining bec; seems to drink back all the fluid that is taken off; only UF 1.4 Monday 4. Anemia/CKD - Hgb 9.1 on Aranesp 200 mcg on Thurs, no IV Fe due to possibility of  calciphylaxis- Hgb 12.7 today - doesn't make sense - recheck Thursday - hold Aranesp if this is true 5. NICM / bivent HF / LVEF 25% / AICD 6. Pacemaker 7. Morbid obesity 8. Nutrition - heart healthy diet + prostat and resource supplements - alb low; KCL suppl  Sheffield Slider, PA-C [facility] Beeper [phone] 02/15/2013,8:43 AM  LOS: 12 days    Additional Objective Labs: Basic Metabolic Panel:  Recent Labs Lab 02/13/13 0535 02/14/13 0419 02/15/13 0420  NA 140 141 138  K 4.1 4.1 3.8  CL 91* 92* 92*  CO2 21 20 23   GLUCOSE 175* 156* 88  BUN 17 18 17   CREATININE 2.95* 3.04* 3.08*  CALCIUM 8.5 8.9 8.8  PHOS 3.9 4.0 4.2   Liver Function Tests:  Recent Labs Lab 02/13/13 0535 02/14/13 0419 02/15/13 0420  ALBUMIN 1.7* 1.7* 1.8*  CBC:  Recent Labs Lab 02/11/13 1352 02/13/13 0535 02/14/13 0419 02/15/13 0420  WBC 10.8* 11.7* 13.3* 8.7  NEUTROABS  --   --  10.8*  --   HGB 9.7* 8.9* 9.1* 12.7*  HCT 29.6* 27.8* 28.3* 40.2  MCV 88.9 88.3 88.7 89.9  PLT 329 336 357 268  CBG:  Recent Labs Lab 02/13/13 2033 02/14/13 0551 02/14/13 1642 02/14/13 2139 02/15/13 0646  GLUCAP 251* 130*  204* 166* 87  Medications:   . amiodarone  200 mg Oral BID  . colchicine  0.6 mg Oral Q3 days  . darbepoetin (ARANESP) injection - DIALYSIS  200 mcg Intravenous Q Thu-HD  . diclofenac sodium  2 g Topical QID  . Febuxostat  1 tablet Oral Daily  . feeding supplement (PRO-STAT SUGAR FREE 64)  30 mL Oral BID WC  . feeding supplement (RESOURCE BREEZE)  1 Container Oral BID BM  . insulin aspart  0-9 Units Subcutaneous TID WC  . insulin glargine  28 Units Subcutaneous QHS  . LORazepam  0.5 mg Oral Daily  . midodrine  10 mg Oral TID WC  . multivitamin  1 tablet Oral QHS  . pantoprazole  40 mg Oral QAC breakfast  . potassium chloride  20 mEq Oral BID  . sodium chloride 0.9 % 100 mL with sodium thiosulfate 25 g  25 g Intravenous Q1200  . traMADol  100 mg Oral BID  .  Warfarin - Pharmacist Dosing Inpatient   Does not apply [id number]

## 2013-02-15 NOTE — Progress Notes (Signed)
I have seen and examined this patient and agree with plan as outlined by M. Doran Durand, PA-C.  Worrisome for coumadin necrosis as he has had well controlled Ca/Phos/iPTH and is a new ESRD patient making calciphylaxis a very unlikely diagnosis and is more c/w coumadin necrosis.  Would hold coumadin and follow.  Agree with holding off on deep tissue biopsy for fear of secondary infection. Maurion Walkowiak A,MD 02/15/2013 9:28 AM

## 2013-02-16 ENCOUNTER — Inpatient Hospital Stay (HOSPITAL_COMMUNITY)
Admission: RE | Admit: 2013-02-16 | Discharge: 2013-02-21 | DRG: 945 | Disposition: A | Discharge status: Other Acute Inpatient Hospital | Payer: Medicaid Other | Source: Intra-hospital | Attending: Physical Medicine & Rehabilitation | Admitting: Physical Medicine & Rehabilitation

## 2013-02-16 DIAGNOSIS — I4891 Unspecified atrial fibrillation: Secondary | ICD-10-CM | POA: Diagnosis present

## 2013-02-16 DIAGNOSIS — Z5189 Encounter for other specified aftercare: Principal | ICD-10-CM

## 2013-02-16 DIAGNOSIS — F17200 Nicotine dependence, unspecified, uncomplicated: Secondary | ICD-10-CM | POA: Diagnosis present

## 2013-02-16 DIAGNOSIS — Z992 Dependence on renal dialysis: Secondary | ICD-10-CM

## 2013-02-16 DIAGNOSIS — I469 Cardiac arrest, cause unspecified: Secondary | ICD-10-CM | POA: Diagnosis not present

## 2013-02-16 DIAGNOSIS — I428 Other cardiomyopathies: Secondary | ICD-10-CM | POA: Diagnosis present

## 2013-02-16 DIAGNOSIS — I872 Venous insufficiency (chronic) (peripheral): Secondary | ICD-10-CM | POA: Diagnosis present

## 2013-02-16 DIAGNOSIS — R5381 Other malaise: Secondary | ICD-10-CM

## 2013-02-16 DIAGNOSIS — N039 Chronic nephritic syndrome with unspecified morphologic changes: Secondary | ICD-10-CM

## 2013-02-16 DIAGNOSIS — G4700 Insomnia, unspecified: Secondary | ICD-10-CM | POA: Diagnosis present

## 2013-02-16 DIAGNOSIS — N186 End stage renal disease: Secondary | ICD-10-CM | POA: Diagnosis present

## 2013-02-16 DIAGNOSIS — E1165 Type 2 diabetes mellitus with hyperglycemia: Secondary | ICD-10-CM | POA: Diagnosis present

## 2013-02-16 DIAGNOSIS — E1129 Type 2 diabetes mellitus with other diabetic kidney complication: Secondary | ICD-10-CM | POA: Diagnosis present

## 2013-02-16 DIAGNOSIS — F411 Generalized anxiety disorder: Secondary | ICD-10-CM | POA: Diagnosis present

## 2013-02-16 DIAGNOSIS — D631 Anemia in chronic kidney disease: Secondary | ICD-10-CM | POA: Diagnosis present

## 2013-02-16 DIAGNOSIS — E1159 Type 2 diabetes mellitus with other circulatory complications: Secondary | ICD-10-CM | POA: Diagnosis present

## 2013-02-16 DIAGNOSIS — I9589 Other hypotension: Secondary | ICD-10-CM | POA: Diagnosis present

## 2013-02-16 DIAGNOSIS — I509 Heart failure, unspecified: Secondary | ICD-10-CM | POA: Diagnosis present

## 2013-02-16 DIAGNOSIS — E1149 Type 2 diabetes mellitus with other diabetic neurological complication: Secondary | ICD-10-CM | POA: Diagnosis present

## 2013-02-16 DIAGNOSIS — G4733 Obstructive sleep apnea (adult) (pediatric): Secondary | ICD-10-CM | POA: Diagnosis present

## 2013-02-16 DIAGNOSIS — I96 Gangrene, not elsewhere classified: Secondary | ICD-10-CM | POA: Diagnosis present

## 2013-02-16 DIAGNOSIS — I5022 Chronic systolic (congestive) heart failure: Secondary | ICD-10-CM | POA: Diagnosis present

## 2013-02-16 DIAGNOSIS — L97109 Non-pressure chronic ulcer of unspecified thigh with unspecified severity: Secondary | ICD-10-CM | POA: Diagnosis present

## 2013-02-16 DIAGNOSIS — I5042 Chronic combined systolic (congestive) and diastolic (congestive) heart failure: Secondary | ICD-10-CM | POA: Diagnosis present

## 2013-02-16 DIAGNOSIS — M726 Necrotizing fasciitis: Secondary | ICD-10-CM | POA: Diagnosis present

## 2013-02-16 DIAGNOSIS — Z95810 Presence of automatic (implantable) cardiac defibrillator: Secondary | ICD-10-CM | POA: Diagnosis present

## 2013-02-16 DIAGNOSIS — Z950 Presence of cardiac pacemaker: Secondary | ICD-10-CM

## 2013-02-16 DIAGNOSIS — Z7901 Long term (current) use of anticoagulants: Secondary | ICD-10-CM

## 2013-02-16 DIAGNOSIS — I120 Hypertensive chronic kidney disease with stage 5 chronic kidney disease or end stage renal disease: Secondary | ICD-10-CM | POA: Diagnosis present

## 2013-02-16 DIAGNOSIS — E1142 Type 2 diabetes mellitus with diabetic polyneuropathy: Secondary | ICD-10-CM | POA: Diagnosis present

## 2013-02-16 LAB — GLUCOSE, CAPILLARY
GLUCOSE-CAPILLARY: 138 mg/dL — AB (ref 70–99)
Glucose-Capillary: 151 mg/dL — ABNORMAL HIGH (ref 70–99)
Glucose-Capillary: 176 mg/dL — ABNORMAL HIGH (ref 70–99)

## 2013-02-16 LAB — RENAL FUNCTION PANEL
Albumin: 1.8 g/dL — ABNORMAL LOW (ref 3.5–5.2)
BUN: 20 mg/dL (ref 6–23)
CHLORIDE: 92 meq/L — AB (ref 96–112)
CO2: 20 meq/L (ref 19–32)
CREATININE: 3.02 mg/dL — AB (ref 0.50–1.35)
Calcium: 9.2 mg/dL (ref 8.4–10.5)
GFR calc Af Amer: 27 mL/min — ABNORMAL LOW (ref >90)
GFR, EST NON AFRICAN AMERICAN: 23 mL/min — AB (ref >90)
Glucose, Bld: 146 mg/dL — ABNORMAL HIGH (ref 70–99)
Phosphorus: 3.7 mg/dL (ref 2.3–4.6)
Potassium: 5.1 mEq/L (ref 3.7–5.3)
Sodium: 137 mEq/L (ref 137–147)

## 2013-02-16 LAB — CBC
HEMATOCRIT: 28.9 % — AB (ref 39.0–52.0)
HEMOGLOBIN: 9.2 g/dL — AB (ref 13.0–17.0)
MCH: 29.1 pg (ref 26.0–34.0)
MCHC: 31.8 g/dL (ref 30.0–36.0)
MCV: 91.5 fL (ref 78.0–100.0)
PLATELETS: 351 10*3/uL (ref 150–400)
RBC: 3.16 MIL/uL — AB (ref 4.22–5.81)
RDW: 19.4 % — ABNORMAL HIGH (ref 11.5–15.5)
WBC: 11.3 10*3/uL — ABNORMAL HIGH (ref 4.0–10.5)

## 2013-02-16 LAB — PROTIME-INR
INR: 2.33 — ABNORMAL HIGH (ref 0.00–1.49)
Prothrombin Time: 24.8 seconds — ABNORMAL HIGH (ref 11.6–15.2)

## 2013-02-16 MED ORDER — FEBUXOSTAT 80 MG PO TABS: 1.0000 | ORAL_TABLET | Freq: Every day | ORAL | Status: DC

## 2013-02-16 MED ORDER — INSULIN ASPART 100 UNIT/ML ~~LOC~~ SOLN
0.0000 [IU] | Freq: Three times a day (TID) | SUBCUTANEOUS | Status: DC
  Administered 2013-02-17 – 2013-02-21 (×7): 1 [IU] via SUBCUTANEOUS

## 2013-02-16 MED ORDER — INSULIN GLARGINE 100 UNIT/ML SOLOSTAR PEN: 28.0000 [IU] | PEN_INJECTOR | Freq: Every day | SUBCUTANEOUS | Status: AC

## 2013-02-16 MED ORDER — AMIODARONE HCL 200 MG PO TABS
200.0000 mg | ORAL_TABLET | Freq: Two times a day (BID) | ORAL | Status: DC
  Administered 2013-02-17 – 2013-02-20 (×8): 200 mg via ORAL
  Filled 2013-02-16 (×10): qty 1

## 2013-02-16 MED ORDER — ACETAMINOPHEN 500 MG PO TABS: 500.0000 mg | ORAL_TABLET | Freq: Four times a day (QID) | ORAL | Status: DC | PRN

## 2013-02-16 MED ORDER — POTASSIUM CHLORIDE CRYS ER 20 MEQ PO TBCR: 20.0000 meq | EXTENDED_RELEASE_TABLET | Freq: Two times a day (BID) | ORAL | Status: AC

## 2013-02-16 MED ORDER — DICLOFENAC SODIUM 1 % TD GEL
2.0000 g | Freq: Four times a day (QID) | TRANSDERMAL | Status: DC
  Administered 2013-02-17 – 2013-02-21 (×10): 2 g via TOPICAL
  Filled 2013-02-16: qty 100

## 2013-02-16 MED ORDER — LORATADINE 10 MG PO TABS
10.0000 mg | ORAL_TABLET | Freq: Every day | ORAL | Status: DC | PRN
  Filled 2013-02-16: qty 1

## 2013-02-16 MED ORDER — ONDANSETRON HCL 4 MG/2ML IJ SOLN: 4.0000 mg | Freq: Four times a day (QID) | INTRAMUSCULAR | Status: DC | PRN

## 2013-02-16 MED ORDER — SORBITOL 70 % SOLN: 30.0000 mL | Freq: Every day | Status: DC | PRN

## 2013-02-16 MED ORDER — ONDANSETRON HCL 4 MG PO TABS
4.0000 mg | ORAL_TABLET | Freq: Four times a day (QID) | ORAL | Status: DC | PRN
  Administered 2013-02-19: 4 mg via ORAL
  Filled 2013-02-16: qty 1

## 2013-02-16 MED ORDER — TRAMADOL HCL 50 MG PO TABS
100.0000 mg | ORAL_TABLET | Freq: Two times a day (BID) | ORAL | Status: DC
  Administered 2013-02-17 – 2013-02-21 (×9): 100 mg via ORAL
  Filled 2013-02-16 (×9): qty 2

## 2013-02-16 MED ORDER — RENA-VITE PO TABS
1.0000 | ORAL_TABLET | Freq: Every day | ORAL | Status: DC
  Administered 2013-02-17: 1 via ORAL
  Administered 2013-02-17: via ORAL
  Administered 2013-02-18 – 2013-02-20 (×3): 1 via ORAL
  Filled 2013-02-16 (×7): qty 1

## 2013-02-16 MED ORDER — PANTOPRAZOLE SODIUM 40 MG PO TBEC
40.0000 mg | DELAYED_RELEASE_TABLET | Freq: Every day | ORAL | Status: DC
  Administered 2013-02-17 – 2013-02-21 (×5): 40 mg via ORAL
  Filled 2013-02-16 (×5): qty 1

## 2013-02-16 MED ORDER — MIDODRINE HCL 10 MG PO TABS: 10.0000 mg | ORAL_TABLET | Freq: Three times a day (TID) | ORAL | Status: AC

## 2013-02-16 MED ORDER — POTASSIUM CHLORIDE CRYS ER 20 MEQ PO TBCR
20.0000 meq | EXTENDED_RELEASE_TABLET | Freq: Two times a day (BID) | ORAL | Status: DC
  Administered 2013-02-17 – 2013-02-21 (×10): 20 meq via ORAL
  Filled 2013-02-16 (×13): qty 1

## 2013-02-16 MED ORDER — WHITE PETROLATUM GEL
Status: DC | PRN
  Administered 2013-02-19 – 2013-02-20 (×2): 0.2 via TOPICAL

## 2013-02-16 MED ORDER — PRO-STAT SUGAR FREE PO LIQD
30.0000 mL | Freq: Two times a day (BID) | ORAL | Status: DC
  Administered 2013-02-17 – 2013-02-21 (×5): 30 mL via ORAL
  Filled 2013-02-16 (×11): qty 30

## 2013-02-16 MED ORDER — HYDROCODONE-ACETAMINOPHEN 7.5-325 MG PO TABS
1.0000 | ORAL_TABLET | ORAL | Status: DC | PRN
  Administered 2013-02-18 (×2): 2 via ORAL
  Administered 2013-02-18: 1 via ORAL
  Filled 2013-02-16 (×2): qty 2
  Filled 2013-02-16 (×2): qty 1

## 2013-02-16 MED ORDER — LORAZEPAM 0.5 MG PO TABS
0.5000 mg | ORAL_TABLET | Freq: Every day | ORAL | Status: DC
  Administered 2013-02-17 – 2013-02-21 (×4): 0.5 mg via ORAL
  Filled 2013-02-16 (×5): qty 1

## 2013-02-16 MED ORDER — INSULIN GLARGINE 100 UNIT/ML ~~LOC~~ SOLN
28.0000 [IU] | Freq: Every day | SUBCUTANEOUS | Status: DC
Start: 2013-02-16 — End: 2013-02-21
  Administered 2013-02-17 – 2013-02-20 (×5): 28 [IU] via SUBCUTANEOUS
  Filled 2013-02-16 (×6): qty 0.28

## 2013-02-16 MED ORDER — HYDROCODONE-ACETAMINOPHEN 7.5-325 MG PO TABS: 1.0000 | ORAL_TABLET | ORAL | Status: AC | PRN

## 2013-02-16 MED ORDER — MIDODRINE HCL 5 MG PO TABS
10.0000 mg | ORAL_TABLET | Freq: Three times a day (TID) | ORAL | Status: DC
  Administered 2013-02-17 – 2013-02-21 (×11): 10 mg via ORAL
  Filled 2013-02-16 (×16): qty 2

## 2013-02-16 MED ORDER — BOOST / RESOURCE BREEZE PO LIQD
1.0000 | Freq: Two times a day (BID) | ORAL | Status: DC
  Administered 2013-02-18 – 2013-02-21 (×6): 1 via ORAL

## 2013-02-16 MED ORDER — FEBUXOSTAT 40 MG PO TABS
80.0000 mg | ORAL_TABLET | Freq: Every day | ORAL | Status: DC
  Administered 2013-02-17 – 2013-02-21 (×5): 80 mg via ORAL
  Filled 2013-02-16 (×6): qty 2

## 2013-02-16 MED ORDER — FLUTICASONE PROPIONATE 50 MCG/ACT NA SUSP
1.0000 | Freq: Every day | NASAL | Status: DC | PRN
  Filled 2013-02-16: qty 16

## 2013-02-16 MED ORDER — DARBEPOETIN ALFA-POLYSORBATE 200 MCG/0.4ML IJ SOLN
200.0000 ug | INTRAMUSCULAR | Status: DC
  Administered 2013-02-17: 200 ug via INTRAVENOUS
  Filled 2013-02-16: qty 0.4

## 2013-02-16 MED ORDER — COLCHICINE 0.6 MG PO TABS
0.6000 mg | ORAL_TABLET | ORAL | Status: DC
Start: 2013-02-18 — End: 2013-02-18
  Filled 2013-02-16: qty 1

## 2013-02-16 NOTE — Progress Notes (Signed)
Patient ID: Adam [name], male   DOB: July 06, 1965, 47 y.o.   MRN: [id number]  [facility] Progress Note    Subjective:   Feels a little better, still with "ache" in legs   Objective:   BP 99/71  Pulse 89  Temp(Src) 97.5 F (36.4 C) (Oral)  Resp 20  Ht 6' (1.829 m)  Wt 139.3 kg (307 lb 1.6 oz)  BMI 41.64 kg/m2  SpO2 94%  Intake/Output: I/O last 3 completed shifts: In: 578 [P.O.:578] Out: 2500 [Other:2500]   Intake/Output this shift:  Total I/O In: 480 [P.O.:480] Out: -  Weight change: -0.9 kg (-1 lb 15.8 oz)  Physical Exam: Gen:WD obese AAM in NAD CVS:no rub Resp:cta SLP:NPYYFR Ext:+indurated, peau d'orange appearance of thighs and calves with ulcerations of medial thighs L>R  Labs: BMET  Recent Labs Lab 02/10/13 0510 02/11/13 0516 02/12/13 0932 02/13/13 0535 02/14/13 0419 02/15/13 0420 02/16/13 0612  NA 140 140 139 140 141 138 137  K 4.4 4.5 3.8 4.1 4.1 3.8 5.1  CL 94* 92* 91* 91* 92* 92* 92*  CO2 22 21 22 21 20 23 20   GLUCOSE 63* 85 123* 175* 156* 88 146*  BUN 14 15 18 17 18 17 20   CREATININE 3.13* 3.14* 3.28* 2.95* 3.04* 3.08* 3.02*  ALBUMIN 1.8*  --  1.8* 1.7* 1.7* 1.8* 1.8*  CALCIUM 8.4 8.4 8.3* 8.5 8.9 8.8 9.2  PHOS 1.6*  --  4.5 3.9 4.0 4.2 3.7   CBC  Recent Labs Lab 02/13/13 0535 02/14/13 0419 02/15/13 0420 02/16/13 0612  WBC 11.7* 13.3* 12.5* 11.3*  NEUTROABS  --  10.8*  --   --   HGB 8.9* 9.1* 8.6* 9.2*  HCT 27.8* 28.3* 26.6* 28.9*  MCV 88.3 88.7 88.7 91.5  PLT 336 357 355 351    @IMGRELPRIORS @ Medications:    . amiodarone  200 mg Oral BID  . colchicine  0.6 mg Oral Q3 days  . darbepoetin (ARANESP) injection - DIALYSIS  200 mcg Intravenous Q Thu-HD  . diclofenac sodium  2 g Topical QID  . Febuxostat  1 tablet Oral Daily  . feeding supplement (PRO-STAT SUGAR FREE 64)  30 mL Oral BID WC  . feeding supplement (RESOURCE BREEZE)  1 Container Oral BID BM  . insulin aspart  0-9 Units Subcutaneous TID WC  . insulin  glargine  28 Units Subcutaneous QHS  . LORazepam  0.5 mg Oral Daily  . midodrine  10 mg Oral TID WC  . multivitamin  1 tablet Oral QHS  . pantoprazole  40 mg Oral QAC breakfast  . potassium chloride  20 mEq Oral BID  . sodium chloride 0.9 % 100 mL with sodium thiosulfate 25 g  25 g Intravenous Q1200  . sodium chloride  3 mL Intravenous Q12H  . traMADol  100 mg Oral BID     [facility]  4.75h F180 Bath 2K/2.25Ca DW 143.5kg RFA AVF No heparin  EPO 22K Hect 6ug No Fe at center  Recent labs: Hgb 8.9 12/29, 8.6 11/25 - on 22,000 Epo over a month; tsat 13% 12/18 got venofer 12/29 - ferritin 62 in October; iPTH 157 12/18 improved with normal Ca and P   Assessment/Plan:  1. Penile gangrene / L thigh gangrene / diffuse bilat LE discoloration with induration/pain - clinical presentation c/w calciphylaxis vs coumadin necrosis (does not clinically fit either process completely since his Phos and iPTH have been controlled and he is compliant with binders and dialysis making  calciphylaxis less likely, moreover he has been on coumadin for over a year which is not c/w coumadin necrosis) 1. Deep tissue biopsy could help determine between the 2 as the treatment for these are quite different.  Would recommend either surgery or Derm to obtain deep tissue punch biopsy to help guide management.  For now we are treating both. 2. prognosis guarded, but wounds stable; pain controlled.  3. Path 1/09 -"benign skin necrosis"; Coumadin could be culprit. Have been following up-to-date Rx for caclciphylaxis but normal iPTH and P suggest not common with calciphylaxis. S/p 7 days HD, now 5 x weekly with Na thiosulfate Q HD; coumadin was restarted 1/19 but have stopped again and will follow.  2. ESRD- plan for HD ThFSa, then 4 days next week 3. Chronic hypotension/volume overload - with ++LE edema, cardiomyopathy w low BP's on midodrine. Continue vol removal, with goal 2-2.5 kg per Rx maintaining SBP >80; repeat Echo 20  - 15% 02/13/13; weights not declining bec; seems to drink back all the fluid that is taken off; only UF 1.4 Monday 4. Anemia/CKD - Hgb 9.1 on Aranesp 200 mcg on Thurs, no IV Fe due to possibility of calciphylaxis- Hgb 12.7 today - doesn't make sense - recheck Thursday - hold Aranesp if this is true 5. NICM / bivent HF / LVEF 25% / AICD 6. Pacemaker 7. Morbid obesity 8. Nutrition - heart healthy diet + prostat and resource supplements - alb low; KCL suppl 9. Dispo- possible transfer to CIR today.  Traniya Prichett A 02/16/2013, 11:12 AM

## 2013-02-16 NOTE — Progress Notes (Signed)
Rehab admissions - Patient is now agreeable to inpatient rehab admission prior to home with girlfriend.  Bed available and can admit to acute inpatient rehab today.  Call me for questions.  #[phone]

## 2013-02-16 NOTE — Progress Notes (Signed)
Subjective: Adam [name] is feeling somewhat better today. He was working with PT when I saw him and was somewhat winded. His leg pain is slighting improved. Spoke with patient at length helping him weigh the pros/cons about going to inpatient rehab. Patient agrees that physical therapy is in his best interest and he does want to go to inpatient rehab.   Objective: Vital signs in last 24 hours: Filed Vitals:   02/15/13 2108 02/16/13 0100 02/16/13 0720 02/16/13 1100  BP: 94/67 95/66 98/70  99/71  Pulse: 108 134 110 89  Temp: 97.5 F (36.4 C) 98 F (36.7 C) 97.5 F (36.4 C)   TempSrc: Oral Oral Oral   Resp: 18 18 20    Height:      Weight:   307 lb 1.6 oz (139.3 kg)   SpO2: 95%  94%    Weight change: -1 lb 15.8 oz (-0.9 kg)  Intake/Output Summary (Last 24 hours) at 02/16/13 1305 Last data filed at 02/16/13 0900  Gross per 24 hour  Intake   1058 ml  Output      0 ml  Net   1058 ml   Physical Exam General: sitting on edge of bed, tired appearing HEENT: NCAT, vision grossly intact Cardiac: RRR Pulm: clear to auscultation bilaterally, no wheezes, rales, or rhonchi Abd: soft, nontender, obese, nondistended, BS present Ext: indurated and hyperpigmented b/l lower extremities up to thighs, multiple erosions/skin breakdown w/ scant serous drainage on bilateral inner thighs; loose dressings in place--appear stable Neurologic: alert & oriented X 3, moving all extremities spontaneously GU: circumscised penis with darkened hard area on tip, non-tender to palpation, no drainage or odor  Lab Results: Basic Metabolic Panel:  Recent Labs Lab 02/15/13 0420 02/16/13 0612  NA 138 137  K 3.8 5.1  CL 92* 92*  CO2 23 20  GLUCOSE 88 146*  BUN 17 20  CREATININE 3.08* 3.02*  CALCIUM 8.8 9.2  PHOS 4.2 3.7   Liver Function Tests:  Recent Labs Lab 02/15/13 0420 02/16/13 0612  ALBUMIN 1.8* 1.8*   CBC:  Recent Labs Lab 02/14/13 0419 02/15/13 0420 02/16/13 0612  WBC 13.3* 12.5*  11.3*  NEUTROABS 10.8*  --   --   HGB 9.1* 8.6* 9.2*  HCT 28.3* 26.6* 28.9*  MCV 88.7 88.7 91.5  PLT 357 355 351   CBG:  Recent Labs Lab 02/15/13 0646 02/15/13 1212 02/15/13 1658 02/15/13 2105 02/16/13 0617 02/16/13 1117  GLUCAP 87 79 138* 130* 138* 151*   Coagulation:  Recent Labs Lab 02/13/13 0535 02/14/13 0419 02/15/13 0420 02/16/13 0612  LABPROT 28.3* 25.7* 27.3* 24.8*  INR 2.77* 2.44* 2.64* 2.33*   Urine Drug Screen: Drugs of Abuse     Component Value Date/Time   LABOPIA NEG 03/17/2006 1455   COCAINSCRNUR NEG 03/17/2006 1455   LABBENZ NEG 03/17/2006 1455   AMPHETMU NEG 03/17/2006 1455    Micro Results: No results found for this or any previous visit (from the past 240 hour(s)). Medications: I have reviewed the patient's current medications. Scheduled Meds: . amiodarone  200 mg Oral BID  . colchicine  0.6 mg Oral Q3 days  . darbepoetin (ARANESP) injection - DIALYSIS  200 mcg Intravenous Q Thu-HD  . diclofenac sodium  2 g Topical QID  . Febuxostat  1 tablet Oral Daily  . feeding supplement (PRO-STAT SUGAR FREE 64)  30 mL Oral BID WC  . feeding supplement (RESOURCE BREEZE)  1 Container Oral BID BM  . insulin aspart  0-9 Units  Subcutaneous TID WC  . insulin glargine  28 Units Subcutaneous QHS  . LORazepam  0.5 mg Oral Daily  . midodrine  10 mg Oral TID WC  . multivitamin  1 tablet Oral QHS  . pantoprazole  40 mg Oral QAC breakfast  . potassium chloride  20 mEq Oral BID  . sodium chloride 0.9 % 100 mL with sodium thiosulfate 25 g  25 g Intravenous Q1200  . sodium chloride  3 mL Intravenous Q12H  . 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 Also considering coumadin necrosis. -continue holding warfarin- apixiban 5mg  BID is an anticoagulation  option, though we will not pursue this at this time as the evidence is weak -Holding off on skin biopsy for now given the significant risk of nonhealing and infection; however, this may be considered in the future if patient does not have the desired improvement -discharge to CIR today  Continue treatment plan for calciphylaxis: -continue midodrine TID -s/p 7 consecutive days HD, now receiving 5 days/week until stabilization of wounds (day 2/5)--did not receive HD today -continue high flow O2 therapy (venturi mask at 15LPM for 2 hours daily) -sodium thiosulfate at the end of each HD session -avoiding calcium containing meds and vit D, low Ca diaslysate, on KCL 20meq po bid.  Goal adjusted Ca 8.5-9.5 -use epo and blood transfusions to keep Hb 9.5-10.5, avoid IV iron -aggressive wound care and pain control -will continue volume removal as tolerated with BP -appreciate renal following  Necrosis of distal penis and foreskin--originally admitted with balanitis and now is s/p circumcision and debridement of penis 02/04/13 by urology. No further surgery at this time. Bladder scan yesterday showed 65cc.  I will speak with Urology today to make sure they are planning to follow patient when he is transferred to CIR.  A Fib: Pt is in and out of afib with elevated HRs last night. Holding coumadin in the setting of possible coumadin necrosis.  -continue amiodarone  DM-II: A1c 4.9 on 12/13/12, which may not be accurate due to dialysis. Patient takes Lantus 45U qHS at home.  -continue lantus to 28U qHS--CBGs have been stable -SSI sensitive -continue cbg monitoring  Hypotension: chronic, stable. Improved after initiation of midodrine this admission. Volume status per HD. -continue midodrine TID  CHF with EF of ~25% per echo 01/2013 with NICM and ICD in place: Stable. 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 D/C to CIR if approved.     The patient does have a current PCP Evelena Peat(Alex Wilson, DO) and [facility] after discharge.  The patient does not have transportation limitations that hinder transportation to clinic appointments.  Services Needed at time of discharge: Y = Yes, Blank = No PT: CIR  OT:   RN:   Equipment:   Other:     LOS: 13 days   Windell Hummingbirdachel Gurtaj Ruz, MD 02/16/2013, 1:05 PM

## 2013-02-16 NOTE — Progress Notes (Signed)
Internal Medicine Attending  Date: 02/16/2013  Patient name: Adam [name] Medical record number: [id number] Date of birth: 1965/04/14 Age: 48 y.o. Gender: male  I saw and evaluated the patient, and discussed his care with housestaff.  I reviewed the resident's note by Dr. Darci Needle and I agree with the resident's findings and plans as documented in her note.

## 2013-02-16 NOTE — PMR Pre-admission (Signed)
PMR Admission Coordinator Pre-Admission Assessment  Patient: Adam [name] is an 48 y.o., male MRN: [id number] DOB: [birth year] Height: 6' (182.9 cm) Weight: 139.3 kg (307 lb 1.6 oz) (bedscale)              Insurance Information HMO:      PPO:       PCP:       IPA:       80/20:       OTHER:   PRIMARY: Medicaid [place] access      Policy#: [id number]      Subscriber: [facility] [place] CM Name:        Phone#:       Fax#:   Pre-Cert#:        Employer: Not employed/disabled Benefits:  Phone #: [phone]     Name: Automated Eff. Date: 02/14/13 is eligible     Deduct:        Out of Pocket Max:        Life Max:   CIR:        SNF:   Outpatient:       Co-Pay:   Home Health:        Co-Pay:   DME:       Co-Pay:   Providers:    Emergency Contact Information Contact Information   Name Relation Home Work [place] Brother [phone]     Pass,Frances Clearence Cheek   [phone]     Current Medical History  Patient Admitting Diagnosis: deconditioning related to necrotizing fasciitis of penis, leg ulcers, multiple medical   History of Present Illness: A 48 y.o. right-handed male with end-stage renal disease hemodialysis Tuesday Thursday Saturday, systolic congestive heart failure with ejection fraction 25%, atrial fibrillation on chronic Coumadin as well as chronic lower extremity wounds. Admitted 02/03/2013 with left leg pain and penile discharge after small laceration that required sutures. Findings of elevated WBC of low-grade fever 100.3 with orthostatic blood pressure. 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 Positive MRSA nasal swab and maintained on contact precautions. Underwent circumcision and debridement of the penile shaft skin 02/04/2013 per Dr.Belsante. Wound care nurse for chronic bilateral lower extremity venous stasis/ erosion ulcers as well as vascular surgery consulted Dr. Myra Gianotti and did not feel ulcers to be arterial in origin but  suspect calciphylaxis versus Coumadin necrosis of bilateral lower extremities as well as the penile necrosis and its was recommended to discontinue Coumadin which took place 02/15/2013. No current plan for deep tissue biopsy for fear of secondary infection. All antibiotics have since been completed. . Hemodialysis ongoing as advised. Chronic hypotension maintained on Midodrine. Echocardiogram with ejection fraction 25% no change from prior tracings. Physical and occupational therapy evaluation completed 02/10/2013 with recommendations for physical medicine rehabilitation consult. Patient will be admitted for comprehensive rehabilitation program.     Past Medical History  Past Medical History  Diagnosis Date  . Other and unspecified hyperlipidemia   . Insomnia, unspecified   . Obstructive sleep apnea (adult) (pediatric)   . Allergic rhinitis, cause unspecified   . Nonischemic cardiomyopathy     a. s/p St. Jude ICD 2011. b. Normal cors 2006. c. EF 25% in 12/2011.  Marland Kitchen Unspecified essential hypertension   . Type II or unspecified type diabetes mellitus without mention of complication, not stated as uncontrolled   . CKD (chronic kidney disease) stage 3, GFR 30-59 ml/min   . Esophageal reflux   . Morbid obesity   . Dysuria   .  Tobacco use disorder   . Antral ulcer   . Renal azotemia   . Arthritis     Gout w/hyperuricemia  . Morbid obesity   . Automatic implantable cardioverter-defibrillator in situ   . Calciphylaxis   . PAF (paroxysmal atrial fibrillation)     a. Dx 08/2012.  . Paroxysmal atrial flutter     a. Dx 08/2012.  . Paroxysmal VT     a. 08/2012 in setting of long QT, argatroban  . Chronic combined systolic and diastolic CHF (congestive heart failure)     a. Due to NICM.    Family History  family history includes Diabetes in his mother; Lung cancer in his father; Microcephaly in his father.  Prior Rehab/Hospitalizations:  No previous rehab admissions.   Current Medications   Current facility-administered medications:acetaminophen (TYLENOL) tablet 500 mg, 500 mg, Oral, Q6H PRN, Lorretta HarpXilin Niu, MD, 325 mg at 02/08/13 0934;  amiodarone (PACERONE) tablet 200 mg, 200 mg, Oral, BID, Lars MassonKatarina H Nelson, MD, 200 mg at 02/16/13 1106;  colchicine tablet 0.6 mg, 0.6 mg, Oral, Q3 days, Lorretta HarpXilin Niu, MD, 0.6 mg at 02/15/13 1250 darbepoetin (ARANESP) injection 200 mcg, 200 mcg, Intravenous, Q Thu-HD, Sheffield SliderMartha B. Bergman, PA-C, 200 mcg at 02/10/13 1121;  diclofenac sodium (VOLTAREN) 1 % transdermal gel 2 g, 2 g, Topical, QID, Lorretta HarpXilin Niu, MD, 2 g at 02/16/13 1000;  Febuxostat TABS 80 mg, 1 tablet, Oral, Daily, Lorretta HarpXilin Niu, MD, 80 mg at 02/16/13 1106;  feeding supplement (PRO-STAT SUGAR FREE 64) liquid 30 mL, 30 mL, Oral, BID WC, Haynes BastSamantha J Worley, RD, 30 mL at 02/16/13 1102 feeding supplement (RESOURCE BREEZE) (RESOURCE BREEZE) liquid 1 Container, 1 Container, Oral, BID BM, Haynes BastSamantha J Worley, RD, 1 Container at 02/16/13 1102;  fluticasone (FLONASE) 50 MCG/ACT nasal spray 1 spray, 1 spray, Each Nare, Daily PRN, Lorretta HarpXilin Niu, MD;  HYDROcodone-acetaminophen (NORCO) 7.5-325 MG per tablet 1-2 tablet, 1-2 tablet, Oral, Q4H PRN, Windell Hummingbirdachel Chikowski, MD, 2 tablet at 02/15/13 0542 insulin aspart (novoLOG) injection 0-9 Units, 0-9 Units, Subcutaneous, TID WC, Darden PalmerSamaya Qureshi, MD, 2 Units at 02/16/13 1250;  insulin glargine (LANTUS) injection 28 Units, 28 Units, Subcutaneous, QHS, Windell Hummingbirdachel Chikowski, MD, 28 Units at 02/15/13 2244;  loratadine (CLARITIN) tablet 10 mg, 10 mg, Oral, Daily PRN, Lorretta HarpXilin Niu, MD, 10 mg at 02/03/13 2146;  LORazepam (ATIVAN) tablet 0.5 mg, 0.5 mg, Oral, Daily, Lorretta HarpXilin Niu, MD, 0.5 mg at 02/16/13 1107 midodrine (PROAMATINE) tablet 10 mg, 10 mg, Oral, TID WC, Maree Krabbeobert D Schertz, MD, 10 mg at 02/16/13 [id number];  multivitamin (RENA-VIT) tablet 1 tablet, 1 tablet, Oral, QHS, Sheffield SliderMartha B. Bergman, PA-C, 1 tablet at 02/15/13 2245;  pantoprazole (PROTONIX) EC tablet 40 mg, 40 mg, Oral, QAC breakfast, Lorretta HarpXilin Niu, MD, 40 mg at  02/16/13 [id number];  potassium chloride SA (K-DUR,KLOR-CON) CR tablet 20 mEq, 20 mEq, Oral, BID, Maree Krabbeobert D Schertz, MD, 20 mEq at 02/16/13 1107 sodium chloride 0.9 % 100 mL with sodium thiosulfate 25 g, 25 g, Intravenous, Q1200, Windell Hummingbirdachel Chikowski, MD, 25 g at 02/15/13 1504;  sodium chloride 0.9 % injection 3 mL, 3 mL, Intravenous, Q12H, Farley LyJerry Dale Joines, MD, 3 mL at 02/16/13 1108;  traMADol (ULTRAM) tablet 100 mg, 100 mg, Oral, BID, Lorretta HarpXilin Niu, MD, 100 mg at 02/16/13 1107;  white petrolatum (VASELINE) gel, , Topical, PRN, Kathi LudwigSigmund I Tannenbaum, MD, 0.2 application at 02/11/13 1340  Patients Current Diet: Cardiac  Precautions / Restrictions Precautions Precautions: Fall Restrictions Weight Bearing Restrictions: No   Prior Activity Level Community (5-7x/wk): Went out 4  X a week.  Went to HD 3 days a week.  Home Assistive Devices / Equipment Home Assistive Devices/Equipment: Dan Humphreys (specify type);Cane (specify quad or straight) Home Equipment: Walker - 2 wheels;Cane - single point;Bedside commode;Shower seat  Prior Functional Level Prior Function Level of Independence: Independent with assistive device(s) Comments: used cane PTA; Had dialysis T,TH, Sat  Current Functional Level Cognition  Overall Cognitive Status: Within Functional Limits for tasks assessed Orientation Level: Oriented X4    Extremity Assessment (includes Sensation/Coordination)          ADLs  Eating/Feeding: Performed;Independent Where Assessed - Eating/Feeding: Edge of bed Grooming: Performed;Wash/dry hands;Wash/dry face;Supervision/safety;Set up Where Assessed - Grooming: Unsupported sitting Upper Body Bathing: Simulated;Supervision/safety;Set up Where Assessed - Upper Body Bathing: Unsupported sitting Lower Body Bathing: Simulated;Maximal assistance Upper Body Dressing: Performed;Supervision/safety;Set up Where Assessed - Upper Body Dressing: Unsupported sitting Lower Body Dressing: Performed;Maximal  assistance Where Assessed - Lower Body Dressing: Supported sit to stand Toilet Transfer: Performed;Moderate assistance;Other (comment) (from high surface) Toilet Transfer Method: Sit to stand Toilet Transfer Equipment: Comfort height toilet Toileting - Clothing Manipulation and Hygiene: Maximal assistance;Other (comment) (difficult for pt to let go of walker to clean self ) Where Assessed - Toileting Clothing Manipulation and Hygiene: Standing Equipment Used: Rolling walker Transfers/Ambulation Related to ADLs: Pt initially declined sit to stand but then agreed to stand EOB to prepare to groom at sink. With bed elevated, pt stood with min assist and walker. ADL Comments: Pt needs to be introduced to AE to make LE dressing easier.  Pt can pull leg up on the bed but the condition of the skin on his leg with sores makes this difficult.    Mobility  Bed Mobility  Overal bed mobility: (pt sitting EOB)     Transfers  Transfers  Overall transfer level: Needs assistance  Equipment used: Rolling walker (2 wheeled)  Transfers: Sit to/from Stand  Sit to Stand: Max assist;+2 physical assistance  General transfer comment: pt uanble to tolerate bed raised this date. maxAx2 required for anterior weight-shift to bring hips over feet due to limited ability to bend knees due to fluid and wounds on bilat LEs    Ambulation / Gait / Stairs / Wheelchair Mobility  Ambulation/Gait Ambulation Distance (Feet):  (10 steps) Gait velocity: slow General Gait Details: significant UE WBing, took 2 steps forward then 2 steps backwards x 5 trials. Pt with + SOB/taxing effort but able to tolerate    Posture / Balance Dynamic Sitting Balance Sitting balance - Comments: Pt was able to take more  challenges to sitting balance today reaching on bed for adl times in both directions.    Special needs/care consideration BiPAP/CPAP No CPM No Continuous Drip IV No  Dialysis Yes, daily until 02/15/13        Days T-TH-SAT  normally Life Vest No Oxygen Uses O2 2L [place] at night Special Bed No Trach Size No Wound Vac (area) No       Skin Has blisters and hard, dead skin areas per patient    Location  B legs/groin dressings Bowel mgmt: BM daily.  Last documented BM 02/14/13 Bladder mgmt: Urinating small amounts PTA.  Not voiding much since he has been receiving HD daily on acute Diabetic mgmt Yes, on insulin now in hospital    Previous Home Environment Living Arrangements: Spouse/significant other Available Help at Discharge: Family;Friend(s);Available 24 hours/day Type of Home: Apartment Home Layout: One level Home Access: Level entry Bathroom Shower/Tub: Engineer, manufacturing systems: Standard Bathroom  Accessibility: Yes Home Care Services: Yes Type of Home Care Services: Home RN;Homehealth aide Home Care Agency (if known): advanced care Additional Comments: Pt has old walker, needs new walker and wheelchair  Discharge Living Setting Plans for Discharge Living Setting: Lives with (comment);Apartment Type of Home at Discharge: Apartment (Apartment is on the 1st level.) Discharge Home Layout: One level Discharge Home Access: Level entry Does the patient have any problems obtaining your medications?: No  Social/Family/Support Systems Patient Roles: Other (Comment) (Has a girlfriend.) Contact Information: Lidia Collum GF; Glee Arvin - brother Anticipated Caregiver: Lidia Collum - GF Anticipated Caregiver's Contact Information: Scarlette Calico [phone] Ability/Limitations of Caregiver: Girlfriend is on disability, not working and can assist after discharge. Caregiver Availability: 24/7 Discharge Plan Discussed with Primary Caregiver: Yes Is Caregiver In Agreement with Plan?: Yes Does Caregiver/Family have Issues with Lodging/Transportation while Pt is in Rehab?: No  Goals/Additional Needs Patient/Family Goal for Rehab: PT S, OT S/Min A, no ST needs Expected length of stay: 9-12 days Cultural  Considerations: [facility] Dietary Needs: Heart diet, thin liquids Equipment Needs: TBD Special Service Needs: Has been on daily HD.  Skipped 1/21 and transitioning to T-TH-Sat HD.  Has been on HD since 08/14.  Says he likes early morning HD and is usually on HD by 6 am. Additional Information: Had RN 3X a week with [facility] for L foot wound PTA Pt/Family Agrees to Admission and willing to participate: Yes Program Orientation Provided & Reviewed with Pt/Caregiver Including Roles  & Responsibilities: Yes  Decrease burden of Care through IP rehab admission: N/A  Possible need for SNF placement upon discharge: Not anticipated  Patient Condition: This patient's medical and functional status has changed since the consult dated: 02/11/13 in which the Rehabilitation Physician determined and documented that the patient's condition is appropriate for intensive rehabilitative care in an inpatient rehabilitation facility. See "History of Present Illness" (above) for medical update. Functional changes are: Currently requiring max A +2 for transfers, ambulated 10 steps mod A RW. Patient's medical and functional status update has been discussed with the Rehabilitation physician and patient remains appropriate for inpatient rehabilitation. Will admit to inpatient rehab today.  Preadmission Screen Completed By:  Trish Mage, 02/16/2013 1:49 PM ______________________________________________________________________   Discussed status with Dr. Riley Kill on 02/16/13 at 1348 and received telephone approval for admission today.  Admission Coordinator:  Trish Mage, time1348/Date01/21/15

## 2013-02-16 NOTE — Progress Notes (Signed)
Physical Therapy Treatment Patient Details Name: Adam [name] MRN: [id number] DOB: 1965/09/27 Today's Date: 02/16/2013 Time: 4497-5300 PT Time Calculation (min): 31 min  PT Assessment / Plan / Recommendation  History of Present Illness Pt with ESRD and CHF.   PT Comments   Pt able to take steps today and tolerated standing x 3 min. Spoke extensively with patient and MD regarding CIR and it's benefits to achieve safe level of function for safe transition home. Explained PM dialysis would be temporary and pt agreeable to go to CIR.   Follow Up Recommendations  CIR;Supervision/Assistance - 24 hour     Does the patient have the potential to tolerate intense rehabilitation     Barriers to Discharge        Equipment Recommendations       Recommendations for Other Services Rehab consult  Frequency Min 3X/week   Progress towards PT Goals Progress towards PT goals: Progressing toward goals  Plan Current plan remains appropriate    Precautions / Restrictions Precautions Precautions: Fall Restrictions Weight Bearing Restrictions: No   Pertinent Vitals/Pain 6/10 LE pain    Mobility  Bed Mobility Overal bed mobility:  (pt sitting EOB) Transfers Overall transfer level: Needs assistance Equipment used: Rolling walker (2 wheeled) Transfers: Sit to/from Stand Sit to Stand: Max assist;+2 physical assistance General transfer comment: pt uanble to tolerate bed raised this date. maxAx2 required for anterior weight-shift to bring hips over feet due to limited ability to bend knees due to fluid and wounds on bilat LEs Ambulation/Gait Ambulation/Gait assistance: Mod assist Ambulation Distance (Feet):  (10 steps) Assistive device: Rolling walker (2 wheeled) Gait Pattern/deviations: Step-to pattern;Wide base of support Gait velocity: slow General Gait Details: significant UE WBing, took 2 steps forward then 2 steps backwards x 5 trials. Pt with + SOB/taxing effort but able to tolerate     Exercises General Exercises - Lower Extremity Ankle Circles/Pumps: AROM;10 reps;Seated Long Arc Quad: AROM;10 reps;Seated;Both   PT Diagnosis:    PT Problem List:   PT Treatment Interventions:     PT Goals (current goals can now be found in the care plan section)    Visit Information  Last PT Received On: 02/16/13 Assistance Needed: +1 History of Present Illness: Pt with ESRD and CHF.    Subjective Data      Cognition  Cognition Arousal/Alertness: Awake/alert Behavior During Therapy: WFL for tasks assessed/performed Overall Cognitive Status: Within Functional Limits for tasks assessed    Balance  Balance Overall balance assessment: Needs assistance Standing balance support: Bilateral upper extremity supported Standing balance-Leahy Scale: Fair Standing balance comment: pt unable to let go of RW  End of Session PT - End of Session Equipment Utilized During Treatment: Gait belt Activity Tolerance: Patient limited by fatigue Patient left:  (sitting EOB) Nurse Communication: Mobility status   GP     Marcene Brawn 02/16/2013, 10:37 AM  Lewis Shock, PT, DPT Pager #: [phone] Office #: [phone]

## 2013-02-16 NOTE — H&P (Signed)
Physical Medicine and Rehabilitation Admission H&P  Chief Complaint   Patient presents with   .  Extremity Pain   :  Chief complaint: Weakness  HPI: Adam [name] is a 48 y.o. right-handed male with end-stage renal disease hemodialysis Tuesday Thursday Saturday, systolic congestive heart failure with ejection fraction 25%, atrial fibrillation on chronic Coumadin as well as chronic lower extremity wounds. Admitted 02/03/2013 with left leg pain and penile discharge after small laceration that required sutures. Findings of elevated WBC of low-grade fever 100.3 with orthostatic blood pressure. 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 Positive MRSA nasal swab and maintained on contact precautions. Underwent circumcision and debridement of the penile shaft skin 02/04/2013 per Dr.Belsante. Wound care nurse for chronic bilateral lower extremity venous stasis/ erosion ulcers as well as vascular surgery consulted Dr. 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 No current plan for deep tissue biopsy for fear of secondary infection. All antibiotics have since been completed. . Hemodialysis ongoing as advised. Chronic hypotension maintained on Midodrine. Echocardiogram with ejection fraction 25% no change from prior tracings. Physical and occupational therapy evaluation completed 02/10/2013 with recommendations for physical medicine rehabilitation consult. Patient was admitted for comprehensive rehabilitation program   ROS Review of Systems  Respiratory: Positive for shortness of breath.  Cardiovascular: Positive for palpitations and leg swelling.  Genitourinary: Positive for dysuria.  Musculoskeletal: Positive for joint pain and myalgias.  Psychiatric/Behavioral: The  patient has insomnia.  All other systems reviewed and are negative    Past Medical History   Diagnosis  Date   .  Other and unspecified hyperlipidemia    .  Insomnia, unspecified    .  Obstructive sleep apnea (adult) (pediatric)    .  Allergic rhinitis, cause unspecified    .  Nonischemic cardiomyopathy      a. s/p St. Jude ICD 2011. b. Normal cors 2006. c. EF 25% in 12/2011.   Marland Kitchen  Unspecified essential hypertension    .  Type II or unspecified type diabetes mellitus without mention of complication, not stated as uncontrolled    .  CKD (chronic kidney disease) stage 3, GFR 30-59 ml/min    .  Esophageal reflux    .  Morbid obesity    .  Dysuria    .  Tobacco use disorder    .  Antral ulcer    .  Renal azotemia    .  Arthritis      Gout w/hyperuricemia   .  Morbid obesity    .  Automatic implantable cardioverter-defibrillator in situ    .  Calciphylaxis    .  PAF (paroxysmal atrial fibrillation)      a. Dx 08/2012.   .  Paroxysmal atrial flutter      a. Dx 08/2012.   .  Paroxysmal VT      a. 08/2012 in setting of long QT, argatroban   .  Chronic combined systolic and diastolic CHF (congestive heart failure)      a. Due to NICM.    Past Surgical History   Procedure  Laterality  Date   .  Cardiac defibrillator placement   2011     St. Jude   .  Esophagogastroduodenoscopy   01/03/2011     Procedure: ESOPHAGOGASTRODUODENOSCOPY (EGD); Surgeon: Jeneen Rinks  Angeline Slim., MD; Location: [facility] ENDOSCOPY; Service: Endoscopy; Laterality: N/A;   .  Av fistula placement  Right  08/31/2012     Procedure: ARTERIOVENOUS (AV) FISTULA CREATION; Surgeon: Angelia Mould, MD; Location: [place]; Service: Vascular; Laterality: Right;   .  Insertion of dialysis catheter  Right  08/31/2012     Procedure: [place] CATHETER-Right Internal Jugular Placement; Surgeon: Angelia Mould, MD; Location: [place]; Service: Vascular; Laterality: Right;   .  Circumcision  N/A  02/04/2013     Procedure:  CIRCUMCISION ADULT; Surgeon: Ailene Rud, MD; Location: [place]; Service: Urology; Laterality: N/A;   .  Irrigation and debridement abscess  N/A  02/04/2013     Procedure: PENILE DEBRIDEMENT ; Surgeon: Ailene Rud, MD; Location: [place]; Service: Urology; Laterality: N/A;   .  Urethrotomy  N/A  02/04/2013     Procedure: CYSTOSCOPY/URETHROTOMY; Surgeon: Ailene Rud, MD; Location: [place]; Service: Urology; Laterality: N/A;   .  Circumcision  N/A  02/04/2013     Procedure: CIRCUMCISION ADULT; Surgeon: Burnard Hawthorne, MD; Location: [place]; Service: Urology; Laterality: N/A;   .  Groin debridement  N/A  02/04/2013     Procedure: Penile DEBRIDEMENT; Surgeon: Burnard Hawthorne, MD; Location: [place]; Service: Urology; Laterality: N/A;    Family History   Problem  Relation  Age of Onset   .  Diabetes  Mother    .  Microcephaly  Father    .  Lung cancer  Father     Social History: reports that he has been smoking Cigarettes. He has been smoking about 0.25 packs per day. He has never used smokeless tobacco. He reports that he does not drink alcohol or use illicit drugs.  Allergies:  Allergies   Allergen  Reactions   .  Argatroban  Other (See Comments)     Ventricular tachycardia   .  Warfarin And Related  Other (See Comments)     Skin necrosis   .  Ace Inhibitors      Acute renal failure with multiple trials   .  Heparin      Thrombcytopenia. SRA positive. See miscellaneous test (SRA results)    Medications Prior to Admission   Medication  Sig  Dispense  Refill   .  acetaminophen (TYLENOL) 500 MG tablet  Take 500 mg by mouth every 6 (six) hours as needed.     Marland Kitchen  amiodarone (PACERONE) 200 MG tablet  Take 200 mg by mouth daily.     .  colchicine 0.6 MG tablet  Take 1 tablet (0.6 mg total) by mouth See admin instructions. Takes every three days.  15 tablet  2   .  diclofenac sodium (VOLTAREN) 1 % GEL  Apply 2 g topically 4 (four) times daily.  1 Tube  2   .  esomeprazole  (NEXIUM) 20 MG capsule  Take 20 mg by mouth daily at 12 noon. TAKE ONE CAPSULE EVERY DAY BEFORE BREAKFAST     .  Febuxostat (ULORIC) 80 MG TABS  Take 1 tablet (80 mg total) by mouth daily.  30 tablet  5   .  fluticasone (FLONASE) 50 MCG/ACT nasal spray  Place 1 spray into both nostrils daily as needed for allergies. For allergies  16 g  2   .  Insulin Glargine (LANTUS SOLOSTAR) 100 UNIT/ML Solostar Pen  Inject 45 Units into the skin daily at 10 pm.     .  loratadine (  CLARITIN) 10 MG tablet  Take 10 mg by mouth daily as needed for allergies.     Marland Kitchen  LORazepam (ATIVAN) 0.5 MG tablet  Take 0.5 mg by mouth daily. TAKE 1 TABLET TWICE A DAY AS NEEDED     .  traMADol (ULTRAM) 50 MG tablet  Take 100 mg by mouth 2 (two) times daily.     Marland Kitchen  warfarin (COUMADIN) 2.5 MG tablet  Take 2.5 mg by mouth daily. Takes 1 and half tab every day except on Monday. Monday takes just 2.5 mg.      Home:  Home Living  Family/patient expects to be discharged to:: Private residence  Living Arrangements: Spouse/significant other  Available Help at Discharge: Family;Friend(s);Available 24 hours/day  Type of Home: Apartment  Home Access: Level entry  Home Layout: One level  Home Equipment: Walker - 2 wheels;Cane - single point;Bedside commode;Shower seat  Additional Comments: Pt has old walker, needs new walker and wheelchair  Functional History:  Prior Function  Comments: used cane PTA; Had dialysis T,TH, Sat  Functional Status:  Mobility:    Ambulation/Gait = mod to max assist Ambulation Distance (Feet): (10 steps)  Gait velocity: slow  General Gait Details: significant UE WBing, took 2 steps forward then 2 steps backwards x 5 trials. Pt with + SOB/taxing effort but able to tolerate   ADL:  ADL  Eating/Feeding: Performed;Independent  Where Assessed - Eating/Feeding: Edge of bed  Grooming: Performed;Wash/dry hands;Wash/dry face;Supervision/safety;Set up  Where Assessed - Grooming: Unsupported sitting  Upper Body  Bathing: Simulated;Supervision/safety;Set up  Where Assessed - Upper Body Bathing: Unsupported sitting  Lower Body Bathing: Simulated;Maximal assistance  Upper Body Dressing: Performed;Supervision/safety;Set up  Where Assessed - Upper Body Dressing: Unsupported sitting  Lower Body Dressing: Performed;Maximal assistance  Where Assessed - Lower Body Dressing: Supported sit to stand  Toilet Transfer: Performed;Moderate assistance;Other (comment) (from high surface)  Toilet Transfer Method: Sit to stand  Toilet Transfer Equipment: Comfort height toilet  Equipment Used: Rolling walker  Transfers/Ambulation Related to ADLs: Pt initially declined sit to stand but then agreed to stand EOB to prepare to groom at sink. With bed elevated, pt stood with min assist and walker.  ADL Comments: Pt needs to be introduced to AE to make LE dressing easier. Pt can pull leg up on the bed but the condition of the skin on his leg with sores makes this difficult.  Cognition:  Cognition  Overall Cognitive Status: Within Functional Limits for tasks assessed  Orientation Level: Oriented X4  Cognition  Arousal/Alertness: Awake/alert  Behavior During Therapy: WFL for tasks assessed/performed  Overall Cognitive Status: Within Functional Limits for tasks assessed    Physical Exam:  Blood pressure 98/70, pulse 110, temperature 97.5 F (36.4 C), temperature source Oral, resp. rate 20, height 6' (1.829 m), weight 139.3 kg (307 lb 1.6 oz), SpO2 94.00%.  Constitutional: He is oriented to person, place, and time.  Morbidly obese  HENT:  Head: Normocephalic.  Eyes: EOM are normal.  Neck: Normal range of motion. Neck supple. No thyromegaly present.  Cardiovascular: Regular rhythm and normal heart sounds.  Cardiac rate controlled  Respiratory: Effort normal and breath sounds normal.  GI: Soft. Bowel sounds are normal. He exhibits no distension.  obese  Musculoskeletal:  RLE 2++ edema, chronic stasis changes. LLE 1+ to  2+ with stasis changes as well.  Neurological: He is alert and oriented to person, place, and time.  UE's grossly 4+ to 5/5. LLE  2/5 HF, 2/5 KE, 3+  ADF and APF. RLE: 1+ HF, 2- KE, ADF and APF 3/5.  Mild sensory changes in feet to LT. Reasonable insight and awareness. CN exam non-specific. Skin:  Thigh wounds dressed with gauze, petroleum gauze, tender. Penile shaft clean and dry, but tender.  Chronic stasis changes in the legs as noted above. Psychiatric:  Flat but more engaged today.   Results for orders placed during the hospital encounter of 02/03/13 (from the past 48 hour(s))   GLUCOSE, CAPILLARY Status: Abnormal    Collection Time    02/14/13 4:42 PM   Result  Value  Range    Glucose-Capillary  204 (*)  70 - 99 mg/dL    Comment 1  Documented in Chart     Comment 2  Notify RN    GLUCOSE, CAPILLARY Status: Abnormal    Collection Time    02/14/13 9:39 PM   Result  Value  Range    Glucose-Capillary  166 (*)  70 - 99 mg/dL    Comment 1  Notify RN    PROTIME-INR Status: Abnormal    Collection Time    02/15/13 4:20 AM   Result  Value  Range    Prothrombin Time  27.3 (*)  11.6 - 15.2 seconds    INR  2.64 (*)  0.00 - 1.49   CBC Status: Abnormal    Collection Time    02/15/13 4:20 AM   Result  Value  Range    WBC  12.5 (*)  4.0 - 10.5 K/uL    Comment:  CALLED TO A TRIPP,RN 1853 02/15/13 WBOND     CORRECTED ON 01/20 AT 1853: PREVIOUSLY REPORTED AS 8.7    RBC  3.00 (*)  4.22 - 5.81 MIL/uL    Comment:  CORRECTED ON 01/20 AT 1853: PREVIOUSLY REPORTED AS 4.47    Hemoglobin  8.6 (*)  13.0 - 17.0 g/dL    Comment:  CORRECTED ON 01/20 AT 1853: PREVIOUSLY REPORTED AS 12.7 DELTA CHECK NOTED REPEATED TO VERIFY    HCT  26.6 (*)  39.0 - 52.0 %    Comment:  CORRECTED ON 01/20 AT 1853: PREVIOUSLY REPORTED AS 40.2    MCV  88.7  78.0 - 100.0 fL    Comment:  CORRECTED ON 01/20 AT 1853: PREVIOUSLY REPORTED AS 89.9    MCH  28.7  26.0 - 34.0 pg    Comment:  CORRECTED ON 01/20 AT 1853: PREVIOUSLY  REPORTED AS 28.4    MCHC  32.3  30.0 - 36.0 g/dL    Comment:  CORRECTED ON 01/20 AT 1853: PREVIOUSLY REPORTED AS 31.6    RDW  19.0 (*)  11.5 - 15.5 %    Comment:  CORRECTED ON 01/20 AT 1853: PREVIOUSLY REPORTED AS 19.6    Platelets  355  150 - 400 K/uL    Comment:  CORRECTED ON 01/20 AT 1853: PREVIOUSLY REPORTED AS 268 DELTA CHECK NOTED REPEATED TO VERIFY   RENAL FUNCTION PANEL Status: Abnormal    Collection Time    02/15/13 4:20 AM   Result  Value  Range    Sodium  138  137 - 147 mEq/L    Potassium  3.8  3.7 - 5.3 mEq/L    Chloride  92 (*)  96 - 112 mEq/L    CO2  23  19 - 32 mEq/L    Glucose, Bld  88  70 - 99 mg/dL    BUN  17  6 - 23 mg/dL    Creatinine, Ser  3.08 (*)  0.50 - 1.35 mg/dL    Calcium  8.8  8.4 - 10.5 mg/dL    Phosphorus  4.2  2.3 - 4.6 mg/dL    Albumin  1.8 (*)  3.5 - 5.2 g/dL    GFR calc non Af Amer  22 (*)  >90 mL/min    GFR calc Af Amer  26 (*)  >90 mL/min    Comment:  (NOTE)     The eGFR has been calculated using the CKD EPI equation.     This calculation has not been validated in all clinical situations.     eGFR's persistently <90 mL/min signify possible Chronic Kidney     Disease.   GLUCOSE, CAPILLARY Status: None    Collection Time    02/15/13 6:46 AM   Result  Value  Range    Glucose-Capillary  87  70 - 99 mg/dL   GLUCOSE, CAPILLARY Status: None    Collection Time    02/15/13 12:12 PM   Result  Value  Range    Glucose-Capillary  79  70 - 99 mg/dL    Comment 1  Notify RN    GLUCOSE, CAPILLARY Status: Abnormal    Collection Time    02/15/13 4:58 PM   Result  Value  Range    Glucose-Capillary  138 (*)  70 - 99 mg/dL    Comment 1  Notify RN    GLUCOSE, CAPILLARY Status: Abnormal    Collection Time    02/15/13 9:05 PM   Result  Value  Range    Glucose-Capillary  130 (*)  70 - 99 mg/dL   PROTIME-INR Status: Abnormal    Collection Time    02/16/13 6:12 AM   Result  Value  Range    Prothrombin Time  24.8 (*)  11.6 - 15.2 seconds    INR  2.33 (*)   0.00 - 1.49   CBC Status: Abnormal    Collection Time    02/16/13 6:12 AM   Result  Value  Range    WBC  11.3 (*)  4.0 - 10.5 K/uL    RBC  3.16 (*)  4.22 - 5.81 MIL/uL    Hemoglobin  9.2 (*)  13.0 - 17.0 g/dL    HCT  28.9 (*)  39.0 - 52.0 %    MCV  91.5  78.0 - 100.0 fL    MCH  29.1  26.0 - 34.0 pg    MCHC  31.8  30.0 - 36.0 g/dL    RDW  19.4 (*)  11.5 - 15.5 %    Platelets  351  150 - 400 K/uL   RENAL FUNCTION PANEL Status: Abnormal    Collection Time    02/16/13 6:12 AM   Result  Value  Range    Sodium  137  137 - 147 mEq/L    Potassium  5.1  3.7 - 5.3 mEq/L    Comment:  NO VISIBLE HEMOLYSIS    Chloride  92 (*)  96 - 112 mEq/L    CO2  20  19 - 32 mEq/L    Glucose, Bld  146 (*)  70 - 99 mg/dL    BUN  20  6 - 23 mg/dL    Creatinine, Ser  3.02 (*)  0.50 - 1.35 mg/dL    Calcium  9.2  8.4 - 10.5 mg/dL    Phosphorus  3.7  2.3 - 4.6 mg/dL    Albumin  1.8 (*)  3.5 -  5.2 g/dL    GFR calc non Af Amer  23 (*)  >90 mL/min    GFR calc Af Amer  27 (*)  >90 mL/min    Comment:  (NOTE)     The eGFR has been calculated using the CKD EPI equation.     This calculation has not been validated in all clinical situations.     eGFR's persistently <90 mL/min signify possible Chronic Kidney     Disease.   GLUCOSE, CAPILLARY Status: Abnormal    Collection Time    02/16/13 6:17 AM   Result  Value  Range    Glucose-Capillary  138 (*)  70 - 99 mg/dL    No results found.  Post Admission Physician Evaluation:  1. Functional deficits secondary to necrotizing fasciitis of penis, calciphylaxis of legs with breakdown---subsequent deconditioning. 2. Patient is admitted to receive collaborative, interdisciplinary care between the physiatrist, rehab nursing staff, and therapy team. 3. Patient's level of medical complexity and substantial therapy needs in context of that medical necessity cannot be provided at a lesser intensity of care such as a SNF. 4. Patient has experienced substantial functional loss  from his/her baseline which was documented above under the "Functional History" and "Functional Status" headings. Judging by the patient's diagnosis, physical exam, and functional history, the patient has potential for functional progress which will result in measurable gains while on inpatient rehab. These gains will be of substantial and practical use upon discharge in facilitating mobility and self-care at the household level. 5. Physiatrist will provide 24 hour management of medical needs as well as oversight of the therapy plan/treatment and provide guidance as appropriate regarding the interaction of the two. 6. 24 hour rehab nursing will assist with bladder management, bowel management, safety, skin/wound care, disease management, medication administration, pain management and patient education and help integrate therapy concepts, techniques,education, etc. 7. PT will assess and treat for/with: Lower extremity strength, range of motion, stamina, balance, functional mobility, safety, adaptive techniques and equipment, pain mgt, education. Goals are: supervision for basic mobility, ?w/c level with short distance ambulation. 8. OT will assess and treat for/with: ADL's, functional mobility, safety, upper extremity strength, adaptive techniques and equipment, pain mgt, education. Goals are: supervision to min assist. 9. SLP will assess and treat for/with: n/a. Goals are: n/a. 10. Case Management and Social Worker will assess and treat for psychological issues and discharge planning. 11. Team conference will be held weekly to assess progress toward goals and to determine barriers to discharge. 12. Patient will receive at least 3 hours of therapy per day at least 5 days per week. 13. ELOS: 15-22 days  14. Prognosis: excellent   Medical Problem List and Plan:  1. Deconditioning related to necrotizing fasciitis of the penis, leg ulcers, multi-medical  2. DVT Prophylaxis/Anticoagulation: SCDs.  3. Pain  Management: Ultram 100 mg twice a day and hydrocodone as needed. Monitor with increased mobility  4. Neuropsych: This patient is capable of making decisions on his own behalf.  5. Atrial fibrillation. Coumadin discontinued 02/15/2013 question Coumadin necrosis lower extremities. Cardiac rate controlled continue amiodarone. Followup cardiology services  6. Necrotizing fasciitis of the penile glans and foreskin. Status post circumcision and debridement 02/04/2013. Followup urology services  7. Chronic lower extremity wounds. Suspect calciphylaxis versus Coumadin necrosis. Wound care as advised  8. Chronic hypotension. midodrine 10 mg 3 times a day. Monitor with increased activity  9. Diabetes mellitus with peripheral neuropathy. Check blood sugars a.c. and at bedtime. Lantus insulin 28 units  each bedtime. DM education 10. MRSA nasal nares. Contact precautions  11. End-stage renal disease. Continue dialysis as directed per renal services.  12. Chronic anemia. Aranesp weekly  13. Anxiety/restlessness. Ativan 0.5 mg daily   Meredith Staggers, MD, [facility]   02/16/2013

## 2013-02-16 NOTE — Progress Notes (Signed)
Patient ID: Adam [name], male   DOB: [birth year], 48 y.o.   MRN: [id number] Pt was admitted to room 4w6 from 3E.  He is watching TV at this time and has no concern

## 2013-02-16 NOTE — Progress Notes (Signed)
Utilization Review Completed Trejan Buda J. Shaneeka Scarboro, RN, BSN, NCM [phone]  

## 2013-02-16 NOTE — Progress Notes (Signed)
D/C Tele Box from this Unit, transported pt. To Inpatient rehab on 4W, I called and gave report to receiving RN before transporting pt. To new unit. All pt.'s belongings were transported with him. Pt. Showed no signs or symptoms of distress or discomfort.

## 2013-02-16 NOTE — Progress Notes (Signed)
Pt's HR up to the 130's sustained, MD notified. Pt denies any pain or discomfort, VSS no distress noticed. No new orders gotten, we'll continue to monitor.

## 2013-02-17 ENCOUNTER — Inpatient Hospital Stay (HOSPITAL_COMMUNITY): Payer: Medicaid Other

## 2013-02-17 ENCOUNTER — Inpatient Hospital Stay (HOSPITAL_COMMUNITY): Payer: Medicaid Other | Admitting: Physical Therapy

## 2013-02-17 ENCOUNTER — Inpatient Hospital Stay (HOSPITAL_COMMUNITY): Payer: Medicaid Other | Admitting: Occupational Therapy

## 2013-02-17 DIAGNOSIS — N186 End stage renal disease: Secondary | ICD-10-CM

## 2013-02-17 DIAGNOSIS — M726 Necrotizing fasciitis: Secondary | ICD-10-CM

## 2013-02-17 DIAGNOSIS — R5381 Other malaise: Secondary | ICD-10-CM

## 2013-02-17 LAB — RENAL FUNCTION PANEL
Albumin: 2.1 g/dL — ABNORMAL LOW (ref 3.5–5.2)
BUN: 34 mg/dL — ABNORMAL HIGH (ref 6–23)
CO2: 13 mEq/L — ABNORMAL LOW (ref 19–32)
Calcium: 9.4 mg/dL (ref 8.4–10.5)
Chloride: 88 mEq/L — ABNORMAL LOW (ref 96–112)
Creatinine, Ser: 4.61 mg/dL — ABNORMAL HIGH (ref 0.50–1.35)
GFR calc Af Amer: 16 mL/min — ABNORMAL LOW (ref >90)
GFR calc non Af Amer: 14 mL/min — ABNORMAL LOW (ref >90)
Glucose, Bld: 122 mg/dL — ABNORMAL HIGH (ref 70–99)
Phosphorus: 5.3 mg/dL — ABNORMAL HIGH (ref 2.3–4.6)
Potassium: 5.9 mEq/L — ABNORMAL HIGH (ref 3.7–5.3)
Sodium: 133 mEq/L — ABNORMAL LOW (ref 137–147)

## 2013-02-17 LAB — GLUCOSE, CAPILLARY
GLUCOSE-CAPILLARY: 106 mg/dL — AB (ref 70–99)
Glucose-Capillary: 133 mg/dL — ABNORMAL HIGH (ref 70–99)
Glucose-Capillary: 132 mg/dL — ABNORMAL HIGH (ref 70–99)
Glucose-Capillary: 151 mg/dL — ABNORMAL HIGH (ref 70–99)

## 2013-02-17 LAB — PROTIME-INR
INR: 2.85 — AB (ref 0.00–1.49)
PROTHROMBIN TIME: 28.9 s — AB (ref 11.6–15.2)

## 2013-02-17 MED ORDER — DARBEPOETIN ALFA-POLYSORBATE 200 MCG/0.4ML IJ SOLN
INTRAMUSCULAR | Status: AC
  Administered 2013-02-17: 200 ug via INTRAVENOUS
  Filled 2013-02-17: qty 0.4

## 2013-02-17 NOTE — Progress Notes (Signed)
INITIAL NUTRITION ASSESSMENT  DOCUMENTATION CODES Per approved criteria  -Morbid Obesity   INTERVENTION: - Continue Prostat BID (each supplement provides 100 kcal and 15 grams of protein) and Resource Breeze BID (each supplement provides 250 kcal and 9 grams of protein) - Nurse planning on continuing to encourage increased meal/supplement intake - RD to continue to monitor   NUTRITION DIAGNOSIS: Inadequate oral intake related to vomiting, poor appetite as evidenced by pt report/0% meal intake today.    Goal: Pt to consume >90% of meals/supplements  Monitor:  Weights, labs, intake, vomiting  Reason for Assessment: Malnutrition screening tool   48 y.o. male  Admitting Dx: Weakness  ASSESSMENT: Pt with end-stage renal disease hemodialysis Tuesday Thursday Saturday, systolic congestive heart failure with ejection fraction 25%, atrial fibrillation on chronic Coumadin as well as chronic lower extremity wounds. Admitted 02/03/2013 with left leg pain and penile discharge after small laceration that required sutures.Urology services consulted suspect necrotizing fasciitis of the penile glans and foreskin. Underwent circumcision and debridement of the penile shaft skin 02/04/2013 per Dr.Belsante. Hemodialysis ongoing as advised. Seen by inpatient RD during past admission who educated pt on high protein foods and noted pt was eating fair, 50% of meals.   Pt out of room during visit for HD. Per conversation with RN, pt threw up his breakfast this morning which included eggs. Refused to eat or drink anything else the rest of the day, even ginger-ale and saltines that RN offered. Pt denied any nausea afterwards, thinks vomiting was related to medications.    Height: Ht Readings from Last 1 Encounters:  02/05/13 6' (1.829 m)    Weight: Wt Readings from Last 1 Encounters:  02/17/13 312 lb 13.3 oz (141.9 kg)    Ideal Body Weight: 178 lb  % Ideal Body Weight: 175%  Wt Readings from Last  10 Encounters:  02/17/13 312 lb 13.3 oz (141.9 kg)  02/16/13 307 lb 1.6 oz (139.3 kg)  02/16/13 307 lb 1.6 oz (139.3 kg)  01/10/13 323 lb 4.8 oz (146.648 kg)  12/31/12 318 lb (144.244 kg)  12/15/12 316 lb (143.337 kg)  12/13/12 316 lb (143.337 kg)  11/19/12 314 lb 9.6 oz (142.702 kg)  10/25/12 314 lb 12.8 oz (142.792 kg)  10/22/12 313 lb 4.8 oz (142.112 kg)    Usual Body Weight: 316 lb per last RD notes  % Usual Body Weight: 99%  BMI:  Body mass index is 42.42 kg/(m^2). Obese class III  Estimated Nutritional Needs: Kcal: 2400 - 2600  Protein: 150 - 170 g  Fluid: 1.2 liters  Skin: +1 RLE, LLE edema, stage II to R buttocks  partial thickness and full thickness skin ulcers of the inner L thigh and L dorsal foot   Diet Order: Cardiac  EDUCATION NEEDS: -No education needs identified at this time   Intake/Output Summary (Last 24 hours) at 02/17/13 1535 Last data filed at 02/17/13 1215  Gross per 24 hour  Intake    600 ml  Output      1 ml  Net    599 ml    Last BM: 1/21  Labs:   Recent Labs Lab 02/14/13 0419 02/15/13 0420 02/16/13 0612  NA 141 138 137  K 4.1 3.8 5.1  CL 92* 92* 92*  CO2 20 23 20   BUN 18 17 20   CREATININE 3.04* 3.08* 3.02*  CALCIUM 8.9 8.8 9.2  PHOS 4.0 4.2 3.7  GLUCOSE 156* 88 146*    CBG (last 3)   Recent Labs  02/16/13 2048 02/17/13 0722 02/17/13 1159  GLUCAP 133* 132* 151*    Scheduled Meds: . amiodarone  200 mg Oral BID  . [START ON 02/18/2013] colchicine  0.6 mg Oral Q3 days  . darbepoetin (ARANESP) injection - DIALYSIS  200 mcg Intravenous Q Thu-HD  . diclofenac sodium  2 g Topical QID  . febuxostat  80 mg Oral Daily  . feeding supplement (PRO-STAT SUGAR FREE 64)  30 mL Oral BID WC  . feeding supplement (RESOURCE BREEZE)  1 Container Oral BID BM  . insulin aspart  0-9 Units Subcutaneous TID WC  . insulin glargine  28 Units Subcutaneous QHS  . LORazepam  0.5 mg Oral Daily  . midodrine  10 mg Oral TID WC  .  multivitamin  1 tablet Oral QHS  . pantoprazole  40 mg Oral QAC breakfast  . potassium chloride  20 mEq Oral BID  . traMADol  100 mg Oral BID    Continuous Infusions:   Past Medical History  Diagnosis Date  . Other and unspecified hyperlipidemia   . Insomnia, unspecified   . Obstructive sleep apnea (adult) (pediatric)   . Allergic rhinitis, cause unspecified   . Nonischemic cardiomyopathy     a. s/p St. Jude ICD 2011. b. Normal cors 2006. c. EF 25% in 12/2011.  Marland Kitchen Unspecified essential hypertension   . Type II or unspecified type diabetes mellitus without mention of complication, not stated as uncontrolled   . CKD (chronic kidney disease) stage 3, GFR 30-59 ml/min   . Esophageal reflux   . Morbid obesity   . Dysuria   . Tobacco use disorder   . Antral ulcer   . Renal azotemia   . Arthritis     Gout w/hyperuricemia  . Morbid obesity   . Automatic implantable cardioverter-defibrillator in situ   . Calciphylaxis   . PAF (paroxysmal atrial fibrillation)     a. Dx 08/2012.  . Paroxysmal atrial flutter     a. Dx 08/2012.  . Paroxysmal VT     a. 08/2012 in setting of long QT, argatroban  . Chronic combined systolic and diastolic CHF (congestive heart failure)     a. Due to NICM.    Past Surgical History  Procedure Laterality Date  . Cardiac defibrillator placement  2011    St. Jude  . Esophagogastroduodenoscopy  01/03/2011    Procedure: ESOPHAGOGASTRODUODENOSCOPY (EGD);  Surgeon: Vertell Novak., MD;  Location: [facility] ENDOSCOPY;  Service: Endoscopy;  Laterality: N/A;  . Av fistula placement Right 08/31/2012    Procedure: ARTERIOVENOUS (AV) FISTULA CREATION;  Surgeon: Chuck Hint, MD;  Location: [facility] OR;  Service: Vascular;  Laterality: Right;  . Insertion of dialysis catheter Right 08/31/2012    Procedure: INSERTION OF DIALYSIS CATHETER-Right Internal Jugular Placement;  Surgeon: Chuck Hint, MD;  Location: [facility] OR;  Service: Vascular;  Laterality: Right;  .  Circumcision N/A 02/04/2013    Procedure: CIRCUMCISION ADULT;  Surgeon: Kathi Ludwig, MD;  Location: [facility] OR;  Service: Urology;  Laterality: N/A;  . Irrigation and debridement abscess N/A 02/04/2013    Procedure: PENILE DEBRIDEMENT ;  Surgeon: Kathi Ludwig, MD;  Location[facility] OR;  Service: Urology;  Laterality: N/A;  . Urethrotomy N/A 02/04/2013    Procedure: CYSTOSCOPY/URETHROTOMY;  Surgeon: Kathi Ludwig, MD;  Location: [facility] OR;  Service: Urology;  Laterality: N/A;  . Circumcision N/A 02/04/2013    Procedure: CIRCUMCISION ADULT;  Surgeon: Henrene Dodge, MD;  Location: [facility] OR;  Service: Urology;  Laterality: N/A;  . Groin debridement N/A 02/04/2013    Procedure: Penile DEBRIDEMENT;  Surgeon: Henrene Dodge, MD;  Location: [facility] OR;  Service: Urology;  Laterality: N/A;    Levon Hedger MS, RD, LDN [phone] Pager [phone] After Hours Pager

## 2013-02-17 NOTE — Progress Notes (Signed)
Patient vomited x 1 after breakfast. No complaints of nausea or stomach pain. Patient states he coughed and then started to vomited. Patient given gingerale and saltine crackers. Will continue to monitor patient.

## 2013-02-17 NOTE — Progress Notes (Signed)
Nursing Note: Pt s/p circumcision ,no dressing as pt refuses.Pt has open wound to both R and L thigh w/ impegnated dressing and both thighs wrapped w/ kerlix.All areas were present on admssion.Both legs form thigh below are black,hard to touch ad tender.Left thigh wound larger than right.wbb

## 2013-02-17 NOTE — Progress Notes (Signed)
Subjective: Patient reports no dressing change yesterday, no ultrasound yesterday for pvr.   Objective: Vital signs in last 24 hours: Temp:  [97 F (36.1 C)-98 F (36.7 C)] 97 F (36.1 C) (01/22 0419) Pulse Rate:  [89-120] 120 (01/22 0419) Resp:  [18-20] 20 (01/22 0419) BP: (92-100)/(60-72) 100/72 mmHg (01/22 0419) SpO2:  [92 %-94 %] 92 % (01/22 0419) Weight:  [141.8 kg (312 lb 9.8 oz)] 141.8 kg (312 lb 9.8 oz) (01/22 0419)  Intake/Output from previous day: 01/21 0701 - 01/22 0700 In: 240 [P.O.:240] Out: -  Intake/Output this shift:    Physical Exam:  General:alert and cooperative GI: not done GU: Post circ wound: sutures out: wound dry, clean, non-tender. Glans: large dry eschar over R and L glans, but no longer enlarging. Tissue hard, but non-tender. Urethra not seen.    Lab Results:  Recent Labs  02/15/13 0420 02/16/13 0612  HGB 8.6* 9.2*  HCT 26.6* 28.9*   BMET  Recent Labs  02/15/13 0420 02/16/13 0612  NA 138 137  K 3.8 5.1  CL 92* 92*  CO2 23 20  GLUCOSE 88 146*  BUN 17 20  CREATININE 3.08* 3.02*  CALCIUM 8.8 9.2    Recent Labs  02/15/13 0420 02/16/13 0612  INR 2.64* 2.33*   No results found for this basename: LABURIN,  in the last 72 hours Results for orders placed during the hospital encounter of 02/03/13  CULTURE, BLOOD (ROUTINE X 2)     Status: None   Collection Time    02/03/13  5:45 AM      Result Value Range Status   Specimen Description BLOOD ARM LEFT   Final   Special Requests BOTTLES DRAWN AEROBIC AND ANAEROBIC 10CC   Final   Culture  Setup Time     Final   Value: 02/03/2013 13:55     Performed at [facility]   Culture     Final   Value: NO GROWTH 5 DAYS     Performed at [facility]   Report Status 02/09/2013 FINAL   Final  CULTURE, BLOOD (ROUTINE X 2)     Status: None   Collection Time    02/03/13  5:52 AM      Result Value Range Status   Specimen Description BLOOD HAND LEFT   Final   Special Requests  BOTTLES DRAWN AEROBIC ONLY 8CC   Final   Culture  Setup Time     Final   Value: 02/03/2013 13:54     Performed at [facility]   Culture     Final   Value: NO GROWTH 5 DAYS     Performed at [facility]   Report Status 02/09/2013 FINAL   Final  MRSA PCR SCREENING     Status: Abnormal   Collection Time    02/03/13 12:07 PM      Result Value Range Status   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 It is not     intended to diagnose MRSA     infection nor to guide or     monitor treatment for     MRSA infections.     RESULT CALLED TO, READ BACK  BY AND VERIFIED WITH:     A. BRAKE RN 14:30 02/03/13 ([email or web address])  WOUND CULTURE     Status: None   Collection Time    02/04/13 10:38 AM      Result Value Range Status   Specimen Description WOUND PENIS   Final   Special Requests NONE   Final   Gram Stain     Final   Value: FEW WBC PRESENT,BOTH PMN AND MONONUCLEAR     RARE SQUAMOUS EPITHELIAL CELLS PRESENT     MODERATE GRAM NEGATIVE RODS     MODERATE GRAM POSITIVE COCCI IN PAIRS     Performed at [facility]   Culture     Final   Value: MULTIPLE ORGANISMS PRESENT, NONE PREDOMINANT     Note: NO STAPHYLOCOCCUS AUREUS ISOLATED NO GROUP A STREP (S.PYOGENES) ISOLATED     Performed at [facility]   Report Status 02/06/2013 FINAL   Final  URINE CULTURE     Status: None   Collection Time    02/06/13  6:11 AM      Result Value Range Status   Specimen Description URINE, CATHETERIZED   Final   Special Requests NONE   Final   Culture  Setup Time     Final   Value: 02/06/2013 18:02     Performed at [facility] Count     Final   Value: >=100,000 COLONIES/ML     Performed at [facility]   Culture     Final   Value: Multiple bacterial morphotypes present, none  predominant. Suggest appropriate recollection if clinically indicated.     Performed at [facility]   Report Status 02/08/2013 FINAL   Final  GC/CHLAMYDIA PROBE AMP     Status: None   Collection Time    02/06/13  6:18 AM      Result Value Range Status   CT Probe RNA NEGATIVE  NEGATIVE Final   GC Probe RNA NEGATIVE  NEGATIVE Final   Comment: (NOTE)                                                                                               **Normal Reference Range: Negative**          Assay performed using the Gen-Probe APTIMA COMBO2 (R) Assay.     Acceptable specimen types for this assay include APTIMA Swabs (Unisex,     endocervical, urethral, or vaginal), first void urine, and ThinPrep     liquid based cytology samples.     Performed at [facility]    Studies/Results: No results found.  Assessment/Plan: Severe vasculopath, but penis seems to be stabilizing. Foley was removed, and am now awaiting pvr. Dressing changes written for yesterday, but not yet done. Gown soiled, and wound contaminated.    Case discussed with RN. Pt needs pvr, wound care, and pvr to see what is in his bladder.  I will have weekend Urologist see.    LOS: 1 day   Delva Derden I 02/17/2013, 9:00 AM

## 2013-02-17 NOTE — Progress Notes (Signed)
Patient information reviewed and entered into eRehab system by Annalia Metzger, RN, CRRN, PPS Coordinator.  Information including medical coding and functional independence measure will be reviewed and updated through discharge.    

## 2013-02-17 NOTE — Progress Notes (Signed)
Occupational Therapy Session Note  Patient Details  Name: Adam [name] MRN: [id number] Date of Birth: Aug 06, 1965  Today's Date: 02/17/2013 Time: 8527-7824 Time Calculation (min): 45 min  Short Term Goals: Week 1:  OT Short Term Goal 1 (Week 1): UB Bath and Dress: Set up sitting EOB or at sink OT Short Term Goal 2 (Week 1): LB Dressing:Mod Assist in sit and stand with AE PRN. OT Short Term Goal 3 (Week 1): Grooming:  Stand at sink to perform 2 grooming tasks with no more than 2 rest breaks OT Short Term Goal 4 (Week 1): Toilet transfer:  Supervision OT Short Term Goal 5 (Week 1): Toileting:  Mod assist  Skilled Therapeutic Interventions/Progress Updates:  Pt in bed upon arrival, therapist encouraged getting out of bed and walking and pt had no interest in getting out of bed . Pt educated on the use of AE for dressing tasks and the role of OT.  Pt participated in bed mobility rolling with max A.  Pt participated in therapeutic exercise using theraband to strengthen arm muscles while sitting up in bed. Pt had no c/o pain.  Focus on education and therapeutic exercise training, reconditioning and activity tolerance.      Therapy Documentation Precautions:  Precautions Precautions: Fall;Other (comment) Precaution Comments: Contact  Restrictions Weight Bearing Restrictions: No     See FIM for current functional status  Therapy/Group: Individual Therapy  Shipley,Sheila 02/17/2013, 2:35 PM  Note reviewed and accurately reflects treatment session.

## 2013-02-17 NOTE — Progress Notes (Signed)
Physical Therapy Note  Patient Details  Name: Adam [name] MRN: [id number] Date of Birth: Feb 28, 1965 Today's Date: 02/17/2013  Time: 1300-1327 27 minutes  1:1 No c/o pain.  Pt required encouragement to participate in out of bed therapy this afternoon, agreeable with encouragement.  Supine to sit with max A, cues for technique.  Sit to stand with RW with mod A.  Pt able to take 2 small steps forward with min A, then asked to use BSC.  Pt seated on BSC with supervision.  Rest of session spent on [facility] with nurse tech present.   Jamilyn Pigeon 02/17/2013, 1:28 PM

## 2013-02-17 NOTE — Progress Notes (Signed)
[facility]ICAL [facility]ICINE [facility] RE[facility]ABILITATION   Subjective/Complaints: Complains of frequent stools last night. Otherwise doing well.  Objective: Vital Signs: Blood pressure 100/72, pulse 120, temperature 97 F (36.1 C), temperature source Axillary, resp. rate 20, weight 141.8 kg (312 lb 9.8 oz), SpO2 92.00%. No results found.  Recent Labs  02/15/13 0420 02/16/13 0612  WBC 12.5* 11.3*  HGB 8.6* 9.2*  HCT 26.6* 28.9*  PLT 355 351    Recent Labs  02/15/13 0420 02/16/13 0612  NA 138 137  K 3.8 5.1  CL 92* 92*  GLUCOSE 88 146*  BUN 17 20  CREATININE 3.08* 3.02*  CALCIUM 8.8 9.2   CBG (last 3)   Recent Labs  02/16/13 1631 02/16/13 2048 02/17/13 0722  GLUCAP 176* 133* 132*    Wt Readings from Last 3 Encounters:  02/17/13 141.8 kg (312 lb 9.8 oz)  02/16/13 139.3 kg (307 lb 1.6 oz)  02/16/13 139.3 kg (307 lb 1.6 oz)    Physical Exam:  Constitutional: He is oriented to person, place, and time.  Morbidly obese  HENT:  Head: Normocephalic.  Eyes: EOM are normal.  Neck: Normal range of motion. Neck supple. No thyromegaly present.  Cardiovascular: Regular rhythm and normal heart sounds.  Cardiac rate controlled  Respiratory: Effort normal and breath sounds normal.  GI: Soft. Bowel sounds are normal. He exhibits no distension.  obese  Musculoskeletal:  RLE 2++ edema, chronic stasis changes. LLE 1+ to 2+ with stasis changes as well.  Neurological: He is alert and oriented to person, place, and time.  UE's grossly 4+ to 5/5. LLE 2/5 HF, 2/5 KE, 3+ ADF and APF. RLE: 1+ HF, 2- KE, ADF and APF 3/5. Mild sensory changes in feet to LT. Reasonable insight and awareness. CN exam non-specific.  Skin:  Thigh wounds with eschar, somewhat dry, non-draining dressed with gauze, petroleum gauze, tender. Penile shaft clean and dry, but tender. Chronic stasis changes in the legs as noted above. Psychiatric:  Generally appropriate  Assessment/Plan: 1. Functional deficits  secondary to deconditioning due to necrotizing fasciitis of penis, multiple medical issues which require 3+ hours per day of interdisciplinary therapy in a comprehensive inpatient rehab setting. Physiatrist is providing close team supervision and 24 hour management of active medical problems listed below. Physiatrist and rehab team continue to assess barriers to discharge/monitor patient progress toward functional and medical goals. FIM:                   Comprehension Comprehension Mode: Auditory Comprehension: 5-Understands complex 90% of the time/Cues < 10% of the time  Expression Expression Mode: Verbal Expression: 5-Expresses complex 90% of the time/cues < 10% of the time  Social Interaction Social Interaction: 6-Interacts appropriately with others with medication or extra time (anti-anxiety, antidepressant).  Problem Solving Problem Solving: 4-Solves basic 75 - 89% of the time/requires cueing 10 - 24% of the time  Memory Memory: 6-More than reasonable amt of time  Medical Problem List and Plan:  1. Deconditioning related to necrotizing fasciitis of the penis, leg ulcers, multi-medical  2. DVT Prophylaxis/Anticoagulation: SCDs.  3. Pain Management: Ultram 100 mg twice a day and hydrocodone as needed. Monitor with increased mobility  4. Neuropsych: This patient is capable of making decisions on his own behalf.  5. Atrial fibrillation. Coumadin discontinued 02/15/2013 with question of Coumadin necrosis lower extremities. Cardiac rate controlled continue amiodarone. Followup cardiology services  6. Necrotizing fasciitis of the penile glans and foreskin. Status post circumcision and debridement 02/04/2013.   -  area healing nicely 7. Chronic lower extremity wounds. Suspect calciphylaxis versus Coumadin necrosis. Wound care as advised  8. Chronic hypotension. midodrine 10 mg 3 times a day. Monitor with increased activity  9. Diabetes mellitus with peripheral neuropathy. Check  blood sugars a.c. and at bedtime. Lantus insulin 28 units each bedtime. 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 End-stage renal disease. Continue dialysis as directed per renal services.  12. Chronic anemia. Aranesp weekly  13. Anxiety/restlessness. Ativan 0.5 mg daily  14. ?loose stool--more soft stool per RN reports---no change to meds at this point. If diarrhea develops, check stool for c diff. Don't want to hold colchicine at this time.  LOS (Days) 1 A FACE TO FACE EVALUATION WAS PERFORMED  SWARTZ,ZACHARY T 02/17/2013 8:23 AM

## 2013-02-17 NOTE — Evaluation (Addendum)
Physical Therapy Assessment and Plan  Patient Details  Name: Adam [name] MRN: [id number] Date of Birth: [birth year]  PT Diagnosis: Abnormality of gait, Difficulty walking, Edema, Pain, Decreased functional endurance, Decreased strength Rehab Potential: Good ELOS: 10-12days   Today's Date: 02/17/2013 Time: 0915-1010 Time Calculation (min): 55 min  Problem List:  Patient Active Problem List   Diagnosis Date Noted  . Physical deconditioning 02/16/2013  . H/O amiodarone therapy 02/10/2013  . Necrosis 02/06/2013  . Balanitis 02/06/2013  . 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 Left leg pain 02/03/2013  . Hypotension 02/03/2013  . Sepsis 02/03/2013  . Atrial fibrillation 10/13/2012  . Long term (current) use of anticoagulants 10/13/2012  . Heparin allergy 09/27/2012  . Hypotension, unspecified 08/05/2012  . End stage renal disease 06/09/2012  . Gout 03/02/2012  . 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 Occult blood positive stool 01/05/2012  . Normocytic anemia 01/05/2012  . Open wound of foot 01/05/2012  . ICD (implantable cardioverter-defibrillator), single, in situ; St. Jude   . Knee pain 02/20/2011  . Right foot pain 01/14/2011  . Gastric ulcer 01/04/2011  . Chronic systolic heart failure 74/82/7078  . ANXIETY 01/01/2009  . DYSLIPIDEMIA 08/12/2007  . OBSTRUCTIVE SLEEP APNEA 02/09/2007  . Type II or unspecified type diabetes mellitus with renal manifestations, uncontrolled 03/17/2006  . Morbid obesity 03/17/2006  . TOBACCO ABUSE 03/17/2006  . HYPERTENSION 03/17/2006  . CARDIOMYOPATHY 03/17/2006  . GERD 03/17/2006    Past Medical History:  Past Medical History  Diagnosis Date  . Other and unspecified hyperlipidemia   . Insomnia, unspecified   . Obstructive sleep apnea (adult) (pediatric)   . Allergic rhinitis, cause unspecified   . Nonischemic cardiomyopathy     a. s/p St. Jude ICD 2011. b. Normal cors 2006. c. EF 25% in 12/2011.  Marland Kitchen Unspecified essential hypertension   .  Type II or unspecified type diabetes mellitus without mention of complication, not stated as uncontrolled   . CKD (chronic kidney disease) stage 3, GFR 30-59 ml/min   . Esophageal reflux   . Morbid obesity   . Dysuria   . Tobacco use disorder   . Antral ulcer   . Renal azotemia   . Arthritis     Gout w/hyperuricemia  . Morbid obesity   . Automatic implantable cardioverter-defibrillator in situ   . Calciphylaxis   . PAF (paroxysmal atrial fibrillation)     a. Dx 08/2012.  . Paroxysmal atrial flutter     a. Dx 08/2012.  . Paroxysmal VT     a. 08/2012 in setting of long QT, argatroban  . Chronic combined systolic and diastolic CHF (congestive heart failure)     a. Due to NICM.   Past Surgical History:  Past Surgical History  Procedure Laterality Date  . Cardiac defibrillator placement  2011    St. Jude  . Esophagogastroduodenoscopy  01/03/2011    Procedure: ESOPHAGOGASTRODUODENOSCOPY (EGD);  Surgeon: Winfield Cunas., MD;  Location[facility] ENDOSCOPY;  Service: Endoscopy;  Laterality: N/A;  . Av fistula placement Right 08/31/2012    Procedure: ARTERIOVENOUS (AV) FISTULA CREATION;  Surgeon: Angelia Mould, MD;  Location: [place];  Service: Vascular;  Laterality: Right;  . Insertion of dialysis catheter Right 08/31/2012    Procedure: [place] CATHETER-Right Internal Jugular Placement;  Surgeon: Angelia Mould, MD;  Location: [place];  Service: Vascular;  Laterality: Right;  . Circumcision N/A 02/04/2013    Procedure: CIRCUMCISION ADULT;  Surgeon: Ailene Rud, MD;  Location: [place];  Service: Urology;  Laterality: N/A;  . Irrigation and debridement abscess N/A 02/04/2013    Procedure: PENILE DEBRIDEMENT ;  Surgeon: Ailene Rud, MD;  Location: [place];  Service: Urology;  Laterality: N/A;  . Urethrotomy N/A 02/04/2013    Procedure: CYSTOSCOPY/URETHROTOMY;  Surgeon: Ailene Rud, MD;  Location: [place];  Service: Urology;  Laterality: N/A;  . Circumcision  N/A 02/04/2013    Procedure: CIRCUMCISION ADULT;  Surgeon: Burnard Hawthorne, MD;  Location: [place];  Service: Urology;  Laterality: N/A;  . Groin debridement N/A 02/04/2013    Procedure: Penile DEBRIDEMENT;  Surgeon: Burnard Hawthorne, MD;  Location: [place];  Service: Urology;  Laterality: N/A;    Assessment & Plan Clinical Impression: MORGEN RITACCO is a 48 y.o. right-handed male with end-stage renal disease hemodialysis Tuesday Thursday Saturday, systolic congestive heart failure with ejection fraction 25%, atrial fibrillation on chronic Coumadin as well as chronic lower extremity wounds. Admitted 02/03/2013 with left leg pain and penile discharge after small laceration that required sutures. Findings of elevated WBC of low-grade fever 100.3 with orthostatic blood pressure. 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 Positive MRSA nasal swab and maintained on contact precautions. Underwent circumcision and debridement of the penile shaft skin 02/04/2013 per Dr.Belsante. Wound care nurse for chronic bilateral lower extremity venous stasis/ erosion ulcers as well as vascular surgery consulted Dr. 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 No current plan for deep tissue biopsy for fear of secondary infection. All antibiotics have since been completed. . Hemodialysis ongoing as advised. Chronic hypotension maintained on Midodrine. Echocardiogram with ejection fraction 25% no change from prior tracings. Physical and occupational therapy evaluation completed 02/10/2013 with recommendations for physical medicine rehabilitation consult. Patient was admitted for comprehensive rehabilitation program. Patient transferred to CIR on 02/16/2013 .   Patient currently requires  mod with mobility secondary to muscle weakness, decreased cardiorespiratoy endurance and decreased standing balance and decreased balance strategies.  Prior to hospitalization, patient was mod(I)-intermittent A with mobility and lived with Alone (Siginificant other will be able to stay) in a [place] home.  Home access is  Level entry.  Patient will benefit from skilled PT intervention to maximize safe functional mobility, minimize fall risk and decrease caregiver burden for planned discharge home w/ 24hr supervision/assist.  Anticipate patient will benefit from follow up [facility] at discharge.  PT - End of Session Activity Tolerance: Tolerates < 10 min activity with changes in vital signs Endurance Deficit: Yes Endurance Deficit Description: Multiple seated rest breaks throughout session PT Assessment Rehab Potential: Good PT Patient demonstrates impairments in the following area(s): Balance;Edema;Endurance;Pain;Safety;Skin Integrity;Other (comment) (strength) PT Transfers Functional Problem(s): Bed Mobility;Bed to Chair;Car;Furniture PT Locomotion Functional Problem(s): Ambulation;Wheelchair Mobility;Stairs PT Plan PT Intensity: Minimum of 1-2 x/day ,45 to 90 minutes PT Frequency: 5 out of 7 days PT Duration Estimated Length of Stay: 10-12days PT Treatment/Interventions: Ambulation/gait training;DME/adaptive equipment instruction;Stair training;UE/LE Strength taining/ROM;Wheelchair propulsion/positioning;UE/LE Coordination activities;Therapeutic Activities;Skin care/wound management;Pain management;Discharge planning;Balance/vestibular training;Cognitive remediation/compensation;Disease management/prevention;Functional mobility training;Patient/family education;Therapeutic Exercise PT Transfers Anticipated Outcome(s): Mod(I)-Min A PT Locomotion Anticipated Outcome(s): Mod(I)-Supervision PT Recommendation Follow Up Recommendations: Home health PT Patient destination: Home Equipment Recommended:  Rolling walker with 5" wheels;Wheelchair cushion (measurements);Wheelchair (measurements);To be determined  Skilled Therapeutic Intervention 1:1. Pt received semi-reclined in bed, ready for therapy. PT  evaluation performed, see detailed objective information below. Tx initiated w/ emphasis on bed mobility, standing endurance w/ RW while nursing performed dressing change on bottom and SPT. Unable to progress w/ ambulation at this time 2/2 fatigue. Pt demonstrating significantly decreased functional endurance req frequent and prolonged seated rest breaks. SOB noted during standing activity. Encouraged elevated B LE and frequent ankle pumps for edema management. Pt semi-reclined in bed at end of session w/ all needs in reach, bed alarm on.   PT Evaluation Precautions/Restrictions Precautions Precautions: Fall;Other (comment) Precaution Comments: Contact  Restrictions Weight Bearing Restrictions: No General Chart Reviewed: Yes Family/Caregiver Present: No Vital Signs  Pain   Home Living/Prior Functioning Home Living Available Help at Discharge: Family;Friend(s);Available 24 hours/day Type of Home: Apartment Home Access: Level entry Home Layout: One level Additional Comments: Pt has old walker, needs new walker and wheelchair  Lives With: Alone (Siginificant other will be able to stay) Prior Function Level of Independence: Requires assistive device for independence;Needs assistance with tranfers (car t/f)  Able to Take Stairs?: No Driving: Yes Vocation: On disability Comments: used RW PTA; Had dialysis T,TH, Sat Vision/Perception  Vision - History Baseline Vision: No visual deficits Patient Visual Report: No change from baseline Perception Perception: Within Functional Limits Praxis Praxis: Intact  Cognition Overall Cognitive Status: Within Functional Limits for tasks assessed Arousal/Alertness: Awake/alert Orientation Level: Oriented X4 Attention:  Selective;Alternating Selective Attention: Appears intact Alternating Attention: Appears intact Memory: Appears intact Awareness: Appears intact Problem Solving: Appears intact Safety/Judgment: Impaired Comments: Pt questioning placement of bed alarm, education regarding safety and to decreask risk for fall. Emphasis on calling for assistance to get OOB Sensation Sensation Light Touch: Appears Intact Proprioception: Appears Intact Coordination Gross Motor Movements are Fluid and Coordinated: No Coordination and Movement Description: decreased fluidity of movements 2/2 B LE edema Motor  Motor Motor: Other (comment) Motor - Skilled Clinical Observations: generalized weakness/deconditioning  Mobility Bed Mobility Bed Mobility: Rolling Left;Supine to Sit;Sit to Supine Rolling Left: 4: Min assist;With rail Rolling Left Details: Verbal cues for technique Supine to Sit: 2: Max assist;HOB elevated;With rails Supine to Sit Details: Verbal cues for precautions/safety;Verbal cues for sequencing Sit to Supine: 3: Mod assist Sit to Supine - Details: Verbal cues for precautions/safety;Verbal cues for technique Transfers Transfers: Yes Sit to Stand: 4: Min assist;3: Mod assist Sit to Stand Details: Verbal cues for precautions/safety;Verbal cues for technique;Verbal cues for safe use of DME/AE Sit to Stand Details (indicate cue type and reason): multiple attempts for momentum  Stand to Sit: 4: Min assist Stand to Sit Details (indicate cue type and reason): Verbal cues for technique;Verbal cues for precautions/safety;Verbal cues for safe use of DME/AE Stand Pivot Transfers: 4: Min assist Stand Pivot Transfer Details: Verbal cues for precautions/safety;Verbal cues for technique;Verbal cues for safe use of DME/AE Locomotion  Ambulation Ambulation: No (Declined this session 2/2 fatigue) Stairs / Additional Locomotion Stairs: No (Did not negotiate PTA) Wheelchair Mobility Wheelchair Mobility:  No (Unable to assess 2/2 fatigue)  Trunk/Postural Assessment  Cervical Assessment Cervical Assessment: Within Functional Limits Thoracic Assessment Thoracic Assessment: Within Functional Limits Lumbar Assessment Lumbar Assessment: Within Functional Limits Postural Control Postural Control: Deficits on evaluation Righting Reactions: delayed- pain and decreased B LE ROM 2/2 edema  Balance Balance Balance Assessed: Yes Static Sitting Balance Static Sitting - Balance Support: Left upper extremity supported;Right upper extremity supported;Feet unsupported Static Sitting - Level of Assistance: 5: Stand by assistance Dynamic Sitting Balance Dynamic Sitting - Balance Support: Left upper extremity supported;Right upper extremity  supported;Feet unsupported;Bilateral upper extremity supported Dynamic Sitting - Level of Assistance: 4: Min assist;5: Stand by assistance Dynamic Sitting - Balance Activities: Lateral lean/weight shifting;Forward lean/weight shifting;Reaching for Consulting civil engineer Standing - Balance Support: Bilateral upper extremity supported Static Standing - Level of Assistance: 4: Min assist Dynamic Standing Balance Dynamic Standing - Balance Support: Bilateral upper extremity supported Dynamic Standing - Level of Assistance: 4: Min assist;3: Mod assist Dynamic Standing - Balance Activities: Forward lean/weight shifting;Lateral lean/weight shifting Extremity Assessment      RLE Assessment RLE Assessment: Exceptions to [facility] RLE AROM (degrees) RLE Overall AROM Comments: grossly decreased 2/2 edema RLE Strength Right Hip Flexion: 3-/5 Right Knee Flexion: 3+/5 Right Knee Extension: 3+/5 Right Ankle Dorsiflexion: 3+/5 Right Ankle Plantar Flexion: 3+/5 LLE Assessment LLE Assessment: Exceptions to WFL LLE AROM (degrees) LLE Overall AROM Comments: grossly decreased 2/2 edema LLE Strength Left Hip Flexion: 2+/5 Left Knee Flexion: 3/5 Left Knee Extension:  3/5 Left Ankle Dorsiflexion: 3/5 Left Ankle Plantar Flexion: 3/5  FIM:  FIM - Bed/Chair Transfer Bed/Chair Transfer Assistive Devices: HOB elevated;Bed rails;Walker Bed/Chair Transfer: 2: Supine > Sit: Max A (lifting assist/Pt. 25-49%);3: Sit > Supine: Mod A (lifting assist/Pt. 50-74%/lift 2 legs);4: Chair or W/C > Bed: Min A (steadying Pt. > 75%);4: Bed > Chair or W/C: Min A (steadying Pt. > 75%) FIM - Locomotion: Stairs Locomotion: Stairs: 0: Activity did not occur (Pt did not negotiate steps PTA)   Refer to Care Plan for Long Term Goals  Recommendations for other services: Neuropsych  Discharge Criteria: Patient will be discharged from PT if patient refuses treatment 3 consecutive times without medical reason, if treatment goals not met, if there is a change in medical status, if patient makes no progress towards goals or if patient is discharged from hospital.  The above assessment, treatment plan, treatment alternatives and goals were discussed and mutually agreed upon: by patient  Gilmore Laroche 02/17/2013, 11:58 AM

## 2013-02-17 NOTE — Consult Note (Signed)
WOC wound follow up Wound type: calciphylaxis vs. Coumadin necrosis.  ? Need for biopsy to confirm diagnosis.  Worsening and extension of the necrosis of the left inner thigh, new necrotic area on the right inner thigh. New areas on the right buttock and lower gluteal skin fold bilateral, MASD (moisture associated skin damage), new frequent stools and limited mobility Measurement: Left inner thigh 24cm x 5.5cm x 0.2cm 100% eschar Right inner thigh: 4.0cm x 6.0cm x 0.2cm 100% eschar Right buttock: 4cm x 6.0cm x 0.2cm pink, moist, clean with epithelial buds over the wound bed Left and right lower gluteal skin folds both 0.5cm x 1.0cm -scant yellow fibrinous material Wound bed:see above Drainage (amount, consistency, odor) none noted at any sites, however the patient reports the new area on the left thigh has been draining on the sheets some Periwound: both thighs have circumferential induration but does not appear to be extending. Dressing procedure/placement/frequency: Changed the thigh wounds to daily xeroform gauze for non adherence and antimicrobial effects, to keep these areas dry also. Hydrogel (saline gel) will maintain moisture better around the open areas of the penis for up to 24 hours or more and the viscosity of the gel allows for application and no topical dressing (which falls off) to be applied, would recommend this gel instead of traditional saline to maintain moisture longer and for ease of application.  Concerning new areas of necrosis and still unclear etiology, would suggest dermatology or surgical evaluation to further work up the underlying etiology.  My recommendations are purely topical care and will not address the etiology of these wounds.  Will follow as I have been once a week for wound assessment and changes in topical care as needed.  WOC team will follow along with you for weekly wound assessments.  Jadee Golebiewski Stryker RN,CWOCN [phone]

## 2013-02-17 NOTE — Progress Notes (Signed)
Patient ID: Adam [name], male   DOB: 1966/01/27, 48 y.o.   MRN: [id number]  [facility] Progress Note    Subjective:   No new complaints   Objective:   BP 100/72  Pulse 120  Temp(Src) 97 F (36.1 C) (Axillary)  Resp 20  Wt 141.8 kg (312 lb 9.8 oz)  SpO2 92%  Intake/Output: I/O last 3 completed shifts: In: 240 [P.O.:240] Out: -    Intake/Output this shift:    Weight change:   Physical Exam: Gen:WD obese AAM in NAD CVS:no rub Resp:cta RFF:[id number] Ext:+indurated area of thighs with central ulceration  Labs: BMET  Recent Labs Lab 02/11/13 0516 02/12/13 0932 02/13/13 0535 02/14/13 0419 02/15/13 0420 02/16/13 0612  NA 140 139 140 141 138 137  K 4.5 3.8 4.1 4.1 3.8 5.1  CL 92* 91* 91* 92* 92* 92*  CO2 21 22 21 20 23 20   GLUCOSE 85 123* 175* 156* 88 146*  BUN 15 18 17 18 17 20   CREATININE 3.14* 3.28* 2.95* 3.04* 3.08* 3.02*  ALBUMIN  --  1.8* 1.7* 1.7* 1.8* 1.8*  CALCIUM 8.4 8.3* 8.5 8.9 8.8 9.2  PHOS  --  4.5 3.9 4.0 4.2 3.7   CBC  Recent Labs Lab 02/13/13 0535 02/14/13 0419 02/15/13 0420 02/16/13 0612  WBC 11.7* 13.3* 12.5* 11.3*  NEUTROABS  --  10.8*  --   --   HGB 8.9* 9.1* 8.6* 9.2*  HCT 27.8* 28.3* 26.6* 28.9*  MCV 88.3 88.7 88.7 91.5  PLT 336 357 355 351    @IMGRELPRIORS @ Medications:    . amiodarone  200 mg Oral BID  . [START ON 02/18/2013] colchicine  0.6 mg Oral Q3 days  . darbepoetin (ARANESP) injection - DIALYSIS  200 mcg Intravenous Q Thu-HD  . diclofenac sodium  2 g Topical QID  . febuxostat  80 mg Oral Daily  . feeding supplement (PRO-STAT SUGAR FREE 64)  30 mL Oral BID WC  . feeding supplement (RESOURCE BREEZE)  1 Container Oral BID BM  . insulin aspart  0-9 Units Subcutaneous TID WC  . insulin glargine  28 Units Subcutaneous QHS  . LORazepam  0.5 mg Oral Daily  . midodrine  10 mg Oral TID WC  . multivitamin  1 tablet Oral QHS  . pantoprazole  40 mg Oral QAC breakfast  . potassium chloride  20 mEq Oral BID   . traMADol  100 mg Oral BID   [facility]: [facility]  4.75h F180 Bath 2K/2.25Ca DW 143.5kg RFA AVF No heparin  EPO 22K Hect No Fe at center   Assessment/Plan:  1. 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 Deep tissue biopsy could help determine between the 2 as the treatment for these are quite different. Would recommend either surgery or Derm to obtain deep tissue punch biopsy to help guide management. For now we are treating both. 2. prognosis guarded, but wounds stable; pain controlled.  3. Path 1/09 -"benign skin necrosis"; Coumadin could be culprit. Have been following up-to-date Rx for caclciphylaxis but normal iPTH and P suggest not common with calciphylaxis. S/p 7 days HD, now 5 x weekly with Na  thiosulfate Q HD; coumadin was restarted 1/19 but have stopped again and will follow.  2. ESRD- plan for HD ThFSa, then 4 days next week 3. Chronic hypotension/volume overload - with ++LE edema, cardiomyopathy w low BP's on midodrine. Continue vol removal, with goal 2-2.5 kg per Rx maintaining SBP >80; repeat Echo 20 - 15% 02/13/13; weights not declining bec; seems to drink back all the fluid that is taken off; only UF 1.4 Monday 4. Anemia/CKD - Hgb 9.1 on Aranesp 200 mcg on Thurs, no IV Fe due to possibility of calciphylaxis- Hgb 12.7 today - doesn't make sense - recheck Thursday - hold Aranesp if this is true 5. NICM / bivent HF / LVEF 25% / AICD 6. Pacemaker 7. Morbid obesity 8. Nutrition - heart healthy diet + prostat and resource supplements - alb low; KCL suppl 9. Dispo- s/p transfer to CIR  yesterday. 10.   Verdie Wilms A 02/17/2013, 11:21 AM

## 2013-02-17 NOTE — Evaluation (Signed)
Occupational Therapy Assessment and Plan  Patient Details  Name: Adam [name] MRN: [id number] Date of Birth: Dec 14, 1965  OT Diagnosis: acute pain, muscle weakness (generalized), decreased endurance, and edema Rehab Potential: Rehab Potential: Good ELOS: 10-12 days   Today's Date: 02/17/2013 Time: 0800-0900 Time Calculation (min): 60 min  Problem List:  Patient Active Problem List   Diagnosis Date Noted  . Physical deconditioning 02/16/2013  . H/O amiodarone therapy 02/10/2013  . Necrosis 02/06/2013  . Balanitis 02/06/2013  . 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 Left leg pain 02/03/2013  . Hypotension 02/03/2013  . Sepsis 02/03/2013  . Atrial fibrillation 10/13/2012  . Long term (current) use of anticoagulants 10/13/2012  . Heparin allergy 09/27/2012  . Hypotension, unspecified 08/05/2012  . End stage renal disease 06/09/2012  . Gout 03/02/2012  . 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 Occult blood positive stool 01/05/2012  . Normocytic anemia 01/05/2012  . Open wound of foot 01/05/2012  . ICD (implantable cardioverter-defibrillator), single, in situ; St. Jude   . Knee pain 02/20/2011  . Right foot pain 01/14/2011  . Gastric ulcer 01/04/2011  . Chronic systolic heart failure 01/00/7121  . ANXIETY 01/01/2009  . DYSLIPIDEMIA 08/12/2007  . OBSTRUCTIVE SLEEP APNEA 02/09/2007  . Type II or unspecified type diabetes mellitus with renal manifestations, uncontrolled 03/17/2006  . Morbid obesity 03/17/2006  . TOBACCO ABUSE 03/17/2006  . HYPERTENSION 03/17/2006  . CARDIOMYOPATHY 03/17/2006  . GERD 03/17/2006    Past Medical History:  Past Medical History  Diagnosis Date  . Other and unspecified hyperlipidemia   . Insomnia, unspecified   . Obstructive sleep apnea (adult) (pediatric)   . Allergic rhinitis, cause unspecified   . Nonischemic cardiomyopathy     a. s/p St. Jude ICD 2011. b. Normal cors 2006. c. EF 25% in 12/2011.  Marland Kitchen Unspecified essential hypertension   . Type II or  unspecified type diabetes mellitus without mention of complication, not stated as uncontrolled   . CKD (chronic kidney disease) stage 3, GFR 30-59 ml/min   . Esophageal reflux   . Morbid obesity   . Dysuria   . Tobacco use disorder   . Antral ulcer   . Renal azotemia   . Arthritis     Gout w/hyperuricemia  . Morbid obesity   . Automatic implantable cardioverter-defibrillator in situ   . Calciphylaxis   . PAF (paroxysmal atrial fibrillation)     a. Dx 08/2012.  . Paroxysmal atrial flutter     a. Dx 08/2012.  . Paroxysmal VT     a. 08/2012 in setting of long QT, argatroban  . Chronic combined systolic and diastolic CHF (congestive heart failure)     a. Due to NICM.   Past Surgical History:  Past Surgical History  Procedure Laterality Date  . Cardiac defibrillator placement  2011    St. Jude  . Esophagogastroduodenoscopy  01/03/2011    Procedure: ESOPHAGOGASTRODUODENOSCOPY (EGD);  Surgeon: Winfield Cunas., MD;  Location: [facility] ENDOSCOPY;  Service: Endoscopy;  Laterality: N/A;  . Av fistula placement Right 08/31/2012    Procedure: ARTERIOVENOUS (AV) FISTULA CREATION;  Surgeon: Angelia Mould, MD;  Location: [place];  Service: Vascular;  Laterality: Right;  . Insertion of dialysis catheter Right 08/31/2012    Procedure: [place] CATHETER-Right Internal Jugular Placement;  Surgeon: Angelia Mould, MD;  Location: [place];  Service: Vascular;  Laterality: Right;  . Circumcision N/A 02/04/2013    Procedure: CIRCUMCISION ADULT;  Surgeon: Ailene Rud, MD;  Location: [place];  Service: Urology;  Laterality: N/A;  . Irrigation and debridement abscess N/A 02/04/2013    Procedure: PENILE DEBRIDEMENT ;  Surgeon: Ailene Rud, MD;  Location: [place];  Service: Urology;  Laterality: N/A;  . Urethrotomy N/A 02/04/2013    Procedure: CYSTOSCOPY/URETHROTOMY;  Surgeon: Ailene Rud, MD;  Location: [place];  Service: Urology;  Laterality: N/A;  . Circumcision N/A 02/04/2013     Procedure: CIRCUMCISION ADULT;  Surgeon: Burnard Hawthorne, MD;  Location: [place];  Service: Urology;  Laterality: N/A;  . Groin debridement N/A 02/04/2013    Procedure: Penile DEBRIDEMENT;  Surgeon: Burnard Hawthorne, MD;  Location: [place];  Service: Urology;  Laterality: N/A;    Assessment & Plan Clinical Impression: A 48 y.o. right-handed male with end-stage renal disease hemodialysis Tuesday Thursday Saturday, systolic congestive heart failure with ejection fraction 25%, atrial fibrillation on chronic Coumadin as well as chronic lower extremity wounds. Admitted 02/03/2013 with left leg pain and penile discharge after small laceration that required sutures. Findings of elevated WBC of low-grade fever 100.3 with orthostatic blood pressure. 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 Positive MRSA nasal swab and maintained on contact precautions. Underwent circumcision and debridement of the penile shaft skin 02/04/2013 per Dr.Belsante. Wound care nurse for chronic bilateral lower extremity venous stasis/ erosion ulcers as well as vascular surgery consulted Dr. 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 No current plan for deep tissue biopsy for fear of secondary infection. All antibiotics have since been completed. . Hemodialysis ongoing as advised. Chronic hypotension maintained on Midodrine. Echocardiogram with ejection fraction 25% no change from prior tracings. Patient transferred to CIR on 02/16/2013 .    Patient currently requires min-total assist with basic self-care skills secondary to muscle weakness, decreased cardiorespiratoy endurance and decreased standing balance.  Prior to hospitalization, patient reports overall Mod I with occasional  assistance with community and functional mobility.    Patient will benefit from skilled intervention to increase independence with basic self-care skills prior to discharge home with care partner.  Anticipate patient will require 24 hour supervision and occasional minimal physical assistance (anticipate patient may need assistance with toileting tasks at d/c) and follow up home health.  OT - End of Session Activity Tolerance: Tolerates 30+ min activity with multiple rests Endurance Deficit: Yes Endurance Deficit Description: multiple rest breaks following bed mobility during BADL tasks OT Assessment Rehab Potential: Good Barriers to Discharge Comments: Patient lives alone yet reports his GF can assist him with whatever he needs at discharge.  She is not working. OT Patient demonstrates impairments in the following area(s): Balance;Edema;Endurance;Motor;Pain;Safety;Skin Integrity OT Basic ADL's Functional Problem(s): Grooming;Bathing;Dressing;Toileting OT Transfers Functional Problem(s): Toilet;Tub/Shower OT Plan OT Intensity: Minimum of 1-2 x/day, 45 to 90 minutes OT Frequency: 5 out of 7 days OT Duration/Estimated Length of Stay: 10-12 days OT Treatment/Interventions: Balance/vestibular training;Community reintegration;DME/adaptive equipment instruction;Discharge planning;Functional mobility training;Psychosocial support;Patient/family education;Pain management;Self Care/advanced ADL retraining;Therapeutic Activities;Wheelchair propulsion/positioning OT Basic Self-Care Anticipated Outcome(s): Overall Min assist with LB OT Toileting Anticipated Outcome(s): Mod Assist OT Bathroom Transfers Anticipated Outcome(s): Supervision-toilet, Min assist with tub shower transfer with bench OT Recommendation Recommendations for Other Services: Neuropsych consult Patient destination: Home Follow Up Recommendations: Home health OT;24 hour supervision/assistance  Skilled Therapeutic Intervention   OT  Evaluation Precautions/Restrictions  Precautions Precautions:  Fall;Other (comment) Precaution Comments: Contact  Restrictions Weight Bearing Restrictions: No Pain Not rated however, reports pain especially with mobility in BLEs and penis.  Patient also reports pain on bottom when sitting with HOB up Home Living/Prior [place] expects to be discharged to:: Private residence Living Arrangements: Spouse/significant other Available Help at Discharge: Family;Friend(s);Available 24 hours/day Type of Home: Apartment Home Access: Level entry Home Layout: One level Additional Comments: Pt has old walker, needs new walker and wheelchair  Lives With: Alone (Siginificant other will be able to stay) Prior Function Level of Independence: Requires assistive device for independence;Needs assistance with tranfers  Able to Take Stairs?: No Driving: Yes Vocation: On disability Comments: used [employer] P[employer]; Had dialysis T,TH, Sat, has tub/shower combo with shower chair and standard commode (has a 3 in 1). ADL Refer to FIM for details. Overall mod-total assist, no clothes available on Eval day. Vision/Perception  Vision - History Baseline Vision: No visual deficits Patient Visual Report: No change from baseline Perception Perception: Within Functional Limits Praxis Praxis: Intact  Cognition Overall Cognitive Status: Within Functional Limits for tasks assessed (tearful at time stating, "I'm scared") Arousal/Alertness: Awake/alert Orientation Level: Oriented X4 Sensation Sensation Light Touch: Appears Intact Proprioception: Appears Intact Coordination Gross Motor Movements are Fluid and Coordinated: No Coordination and Movement Description: decreased fluidity of movements 2/2 B LE edema Motor  Motor Motor: Other (comment) Motor - Skilled Clinical Observations: generalized weakness/deconditioning Mobility  Bed Mobility Bed Mobility: Rolling Left;Supine to Sit;Sit to  Supine Rolling Left: 4: Min assist;With rail Rolling Left Details: Verbal cues for technique Supine to Sit: 2: Max assist;HOB elevated;With rails Supine to Sit Details: Verbal cues for precautions/safety;Verbal cues for sequencing Sit to Supine: 3: Mod assist Sit to Supine - Details: Verbal cues for precautions/safety;Verbal cues for technique Transfers (per PT) Sit to Stand: 4: Min assist;3: Mod assist Sit to Stand Details: Verbal cues for precautions/safety;Verbal cues for technique;Verbal cues for safe use of DME/AE Sit to Stand Details (indicate cue type and reason): multiple attempts for momentum  Stand to Sit: 4: Min assist Stand to Sit Details (indicate cue type and reason): Verbal cues for technique;Verbal cues for precautions/safety;Verbal cues for safe use of DME/AE  Trunk/Postural Assessment  Cervical Assessment Cervical Assessment: Within Functional Limits Thoracic Assessment Thoracic Assessment: Within Functional Limits Lumbar Assessment Lumbar Assessment: Within Functional Limits Postural Control Postural Control: Deficits on evaluation Righting Reactions: delayed- pain and decreased B LE ROM 2/2 edema  Balance Balance (per PT) Static Sitting Balance Static Sitting - Balance Support: Left upper extremity supported;Right upper extremity supported;Feet unsupported Static Sitting - Level of Assistance: 5: Stand by assistance Dynamic Sitting Balance Dynamic Sitting - Balance Support: Left upper extremity supported;Right upper extremity supported;Feet unsupported;Bilateral upper extremity supported Dynamic Sitting - Level of Assistance: 4: Min assist;5: Stand by assistance Dynamic Sitting - Balance Activities: Lateral lean/weight shifting;Forward lean/weight shifting;Reaching for objects Static Standing Balance Static Standing - Balance Support: Bilateral upper extremity supported Static Standing - Level of Assistance: 4: Min assist Static Standing - Comment/# of Minutes:  Wide BOS 2/2 B LE edema, SOB noted w/ prolonged standing Dynamic Standing Balance Dynamic Standing - Balance Support: Bilateral upper extremity supported Dynamic Standing - Level of Assistance: 4: Min assist;3: Mod assist Dynamic Standing - Balance Activities: Forward lean/weight shifting;Lateral lean/weight shifting Extremity/Trunk Assessment RUE Assessment RUE Assessment: Within Functional Limits LUE Assessment LUE Assessment: Within Functional Limits  FIM:  FIM - Bathing Bathing Steps Patient Completed: Chest;Right Arm;Left Arm;Abdomen Bathing: 2: Max-Patient  completes 3-4 31f10 parts or 25-49% (bed level with HOB up) FIM - Upper Body Dressing/Undressing Upper body dressing/undressing: 0: Wears gown/pajamas-no public clothing FIM - Lower Body Dressing/Undressing Lower body dressing/undressing: 0: Wears gInterior and spatial designerFIM - BControl and instrumentation engineerDevices: HOB elevated;Bed rails;Walker Bed/Chair Transfer: 2: Supine > Sit: Max A (lifting assist/Pt. 25-49%);3: Sit > Supine: Mod A (lifting assist/Pt. 50-74%/lift 2 legs);4: Chair or W/C > Bed: Min A (steadying Pt. > 75%);4: Bed > Chair or W/C: Min A (steadying Pt. > 75%) FIM - Tub/Shower Transfers Tub/shower Transfers: 0-Activity did not occur or was simulated (N/A at this time due to extensive wound issues)   Refer to Care Plan for Long Term Goals  Recommendations for other services: Neuropsych  Discharge Criteria: Patient will be discharged from OT if patient refuses treatment 3 consecutive times without medical reason, if treatment goals not met, if there is a change in medical status, if patient makes no progress towards goals or if patient is discharged from hospital.  The above assessment, treatment plan, treatment alternatives and goals were discussed and mutually agreed upon: by patient  Katharin Schneider 02/17/2013, 12:58 PM

## 2013-02-18 ENCOUNTER — Inpatient Hospital Stay (HOSPITAL_COMMUNITY): Payer: Medicaid Other

## 2013-02-18 ENCOUNTER — Encounter (HOSPITAL_COMMUNITY): Payer: Medicaid Other

## 2013-02-18 DIAGNOSIS — S71109A Unspecified open wound, unspecified thigh, initial encounter: Secondary | ICD-10-CM

## 2013-02-18 DIAGNOSIS — S71009A Unspecified open wound, unspecified hip, initial encounter: Secondary | ICD-10-CM

## 2013-02-18 LAB — BASIC METABOLIC PANEL
BUN: 21 mg/dL (ref 6–23)
CO2: 23 mEq/L (ref 19–32)
CREATININE: 3.54 mg/dL — AB (ref 0.50–1.35)
Calcium: 8.7 mg/dL (ref 8.4–10.5)
Chloride: 96 mEq/L (ref 96–112)
GFR, EST AFRICAN AMERICAN: 22 mL/min — AB (ref >90)
GFR, EST NON AFRICAN AMERICAN: 19 mL/min — AB (ref >90)
Glucose, Bld: 114 mg/dL — ABNORMAL HIGH (ref 70–99)
Potassium: 4.3 mEq/L (ref 3.7–5.3)
Sodium: 140 mEq/L (ref 137–147)

## 2013-02-18 LAB — IRON AND TIBC
Iron: 35 ug/dL — ABNORMAL LOW (ref 42–135)
SATURATION RATIOS: 17 % — AB (ref 20–55)
TIBC: 204 ug/dL — ABNORMAL LOW (ref 215–435)
UIBC: 169 ug/dL (ref 125–400)

## 2013-02-18 LAB — GLUCOSE, CAPILLARY
Glucose-Capillary: 118 mg/dL — ABNORMAL HIGH (ref 70–99)
Glucose-Capillary: 90 mg/dL (ref 70–99)
Glucose-Capillary: 77 mg/dL (ref 70–99)

## 2013-02-18 LAB — CLOSTRIDIUM DIFFICILE BY PCR: CDIFFPCR: NEGATIVE

## 2013-02-18 LAB — FERRITIN: Ferritin: 345 ng/mL — ABNORMAL HIGH (ref 22–322)

## 2013-02-18 MED ORDER — SEVELAMER CARBONATE 800 MG PO TABS
1600.0000 mg | ORAL_TABLET | Freq: Three times a day (TID) | ORAL | Status: DC
  Administered 2013-02-19 – 2013-02-21 (×7): 1600 mg via ORAL
  Filled 2013-02-18 (×11): qty 2

## 2013-02-18 MED ORDER — COLCHICINE 0.6 MG PO TABS
0.6000 mg | ORAL_TABLET | ORAL | Status: DC
  Administered 2013-02-20: 0.6 mg via ORAL
  Filled 2013-02-18: qty 1

## 2013-02-18 MED ORDER — MIDODRINE HCL 5 MG PO TABS
ORAL_TABLET | ORAL | Status: AC
  Filled 2013-02-18: qty 2

## 2013-02-18 MED ORDER — LOPERAMIDE HCL 2 MG PO CAPS
2.0000 mg | ORAL_CAPSULE | Freq: Four times a day (QID) | ORAL | Status: DC | PRN
  Filled 2013-02-18: qty 1

## 2013-02-18 MED ORDER — SACCHAROMYCES BOULARDII 250 MG PO CAPS
250.0000 mg | ORAL_CAPSULE | Freq: Two times a day (BID) | ORAL | Status: DC
  Administered 2013-02-18 – 2013-02-21 (×7): 250 mg via ORAL
  Filled 2013-02-18 (×9): qty 1

## 2013-02-18 NOTE — IPOC Note (Signed)
Overall Plan of Care [facility]) Patient Details Name: Adam [name] MRN: [id number] DOB: [birth year]  Admitting Diagnosis: Deconditioned  Hospital Problems: Active Problems:   Physical deconditioning     Functional Problem List: Nursing Bladder;Bowel;Edema;Medication Management;Nutrition;Pain;Perception;Safety;Skin Integrity  PT Balance;Edema;Endurance;Pain;Safety;Skin Integrity;Other (comment) (strength)  OT Balance;Edema;Endurance;Motor;Pain;Safety;Skin Integrity  SLP    TR         Basic ADL's: OT Grooming;Bathing;Dressing;Toileting     Advanced  ADL's: OT       Transfers: PT Bed Mobility;Bed to Chair;Car;Furniture  OT Toilet;Tub/Shower     Locomotion: PT Ambulation;Wheelchair Mobility;Stairs     Additional Impairments: OT    SLP        TR      Anticipated Outcomes Item Anticipated Outcome  Self Feeding    Swallowing      Basic self-care  Overall Min assist with LB  Toileting  Mod Assist   Bathroom Transfers Supervision-toilet, Min assist with tub shower transfer with bench  Bowel/Bladder  cont of bladder  Transfers  Supervision-Min A  Locomotion  Mod(I)-Supervision  Communication     Cognition     Pain  less than 3  Safety/Judgment  adhere to safety protocol   Therapy Plan: PT Intensity: Minimum of 1-2 x/day ,45 to 90 minutes PT Frequency: 5 out of 7 days PT Duration Estimated Length of Stay: 10-12days OT Intensity: Minimum of 1-2 x/day, 45 to 90 minutes OT Frequency: 5 out of 7 days OT Duration/Estimated Length of Stay: 10-12 days         Team Interventions: Nursing Interventions Disease Management/Prevention;Skin Care/Wound Management;Bowel Management;Bladder Management;Medication Management;Discharge Planning;Patient/Family Education;Pain Management  PT interventions Ambulation/gait training;DME/adaptive equipment instruction;Stair training;UE/LE Strength taining/ROM;Wheelchair propulsion/positioning;UE/LE Coordination  activities;Therapeutic Activities;Skin care/wound management;Pain management;Discharge planning;Balance/vestibular training;Cognitive remediation/compensation;Disease management/prevention;Functional mobility training;Patient/family education;Therapeutic Exercise  OT Interventions Balance/vestibular training;Community reintegration;DME/adaptive equipment instruction;Discharge planning;Functional mobility training;Psychosocial support;Patient/family education;Pain management;Self Care/advanced ADL retraining;Therapeutic Activities;Wheelchair propulsion/positioning  SLP Interventions    TR Interventions    SW/CM Interventions      Team Discharge Planning: Destination: PT-Home ,OT- Home , SLP-  Projected Follow-up: PT-Home health PT, OT-  Home health OT;24 hour supervision/assistance, SLP-  Projected Equipment Needs: PT-Rolling walker with 5" wheels;Wheelchair cushion (measurements);Wheelchair (measurements);To be determined, OT-  , SLP-  Equipment Details: PT- , OT-  Patient/family involved in discharge planning: PT- Patient,  OT-Patient, SLP-   MD ELOS: 12 days Medical Rehab Prognosis:  Excellent Assessment: The patient has been admitted for CIR therapies. The team will be addressing, functional mobility, strength, stamina, balance, safety, adaptive techniques/equipment, self-care, bowel and bladder mgt, patient and caregiver education, pain mgt, wound care, edema mgt. Goals have been set at supervision to min assist with mobilitty and min to mod assist with ADL's. Ranelle Oyster, MD, FAAPMR      See Team Conference Notes for weekly updates to the plan of care

## 2013-02-18 NOTE — Progress Notes (Signed)
Occupational Therapy Session Note  Patient Details  Name: BELMIN WALCOTT MRN: [id number] Date of Birth: 09/21/1965  Today's Date: 02/18/2013 Time: 1130-1200 Time Calculation (min): 30 min  Short Term Goals: Week 1:  OT Short Term Goal 1 (Week 1): UB Bath and Dress: Set up sitting EOB or at sink OT Short Term Goal 2 (Week 1): LB Dressing:Mod Assist in sit and stand with AE PRN. OT Short Term Goal 3 (Week 1): Grooming:  Stand at sink to perform 2 grooming tasks with no more than 2 rest breaks OT Short Term Goal 4 (Week 1): Toilet transfer:  Supervision OT Short Term Goal 5 (Week 1): Toileting:  Mod assist  Skilled Therapeutic Interventions/Progress Updates:    Pt was supine in bed upon arrival. Pt participated in therapeutic exercise to strengthen BUE while lying supine in bed. Pt participated in B UE bicep curls and B UE shoulder adduction/scapular mobilization.  Pt required mod verbal cues to keep eyes open and max verbal cues to complete exercises.  Pt did not c/o pain but was very drowsy during therapy session.  Focus on endurance, reconditioning and increasing activity tolerance.    Therapy Documentation Precautions:  Precautions Precautions: Fall;Other (comment) Precaution Comments: Contact  Restrictions Weight Bearing Restrictions: No  See FIM for current functional status  Therapy/Group: Individual Therapy  Shipley,Sheila 02/18/2013, 12:08 PM Note reviewed and accurately reflects treatment session.

## 2013-02-18 NOTE — Consult Note (Signed)
I have seen and examined the patient and agree with the assessment and plans.  Danniella Robben A. Dermot Gremillion  MD, FACS  

## 2013-02-18 NOTE — Progress Notes (Signed)
[place] PHYSICAL MEDICINE & REHABILITATION   Subjective/Complaints: Still with frequent stools, more loose now.  Objective: Vital Signs: Blood pressure 97/60, pulse 116, temperature 98.2 F (36.8 C), temperature source Oral, resp. rate 18, weight 144.3 kg (318 lb 2 oz), SpO2 94.00%. No results found.  Recent Labs  02/16/13 0612  WBC 11.3*  HGB 9.2*  HCT 28.9*  PLT 351    Recent Labs  02/17/13 1530 02/18/13 0554  NA 133* 140  K 5.9* 4.3  CL 88* 96  GLUCOSE 122* 114*  BUN 34* 21  CREATININE 4.61* 3.54*  CALCIUM 9.4 8.7   CBG (last 3)   Recent Labs  02/17/13 1159 02/17/13 2131 02/18/13 0657  GLUCAP 151* 106* 118*    Wt Readings from Last 3 Encounters:  02/18/13 144.3 kg (318 lb 2 oz)  02/16/13 139.3 kg (307 lb 1.6 oz)  02/16/13 139.3 kg (307 lb 1.6 oz)    Physical Exam:  Constitutional: He is oriented to person, place, and time.  Morbidly obese  HENT:  Head: Normocephalic.  Eyes: EOM are normal.  Neck: Normal range of motion. Neck supple. No thyromegaly present.  Cardiovascular: Regular rhythm and normal heart sounds.  Cardiac rate controlled  Respiratory: Effort normal and breath sounds normal.  GI: Soft. Bowel sounds are normal. He exhibits no distension.  obese  Musculoskeletal:  RLE 2++ edema, chronic stasis changes. LLE 1+ to 2+ with stasis changes as well.  Neurological: He is alert and oriented to person, place, and time.  UE's grossly 4+ to 5/5. LLE 2/5 HF, 2/5 KE, 3+ ADF and APF. RLE: 1+ HF, 2- KE, ADF and APF 3/5. Mild sensory changes in feet to LT. Reasonable insight and awareness. CN exam non-specific.  Skin:  Thigh wounds with eschar, somewhat dry, non-draining dressed with gauze, petroleum gauze, tender. Penile shaft clean with dressing.  Chronic stasis changes in the legs as noted above. Psychiatric:  Generally appropriate  Assessment/Plan: 1. Functional deficits secondary to deconditioning due to necrotizing fasciitis of penis,  multiple medical issues which require 3+ hours per day of interdisciplinary therapy in a comprehensive inpatient rehab setting. Physiatrist is providing close team supervision and 24 hour management of active medical problems listed below. Physiatrist and rehab team continue to assess barriers to discharge/monitor patient progress toward functional and medical goals. FIM: FIM - Bathing Bathing Steps Patient Completed: Chest;Right Arm;Left Arm;Abdomen Bathing: 2: Max-Patient completes 3-4 6438f 10 parts or 25-49% (bed level with HOB up)  FIM - Upper Body Dressing/Undressing Upper body dressing/undressing: 0: Wears gown/pajamas-no public clothing FIM - Lower Body Dressing/Undressing Lower body dressing/undressing: 0: Wears Oceanographergown/pajamas-no public clothing        FIM - BankerBed/Chair Transfer Bed/Chair Transfer Assistive Devices: HOB elevated;Bed rails;Walker Bed/Chair Transfer: 2: Supine > Sit: Max A (lifting assist/Pt. 25-49%);3: Sit > Supine: Mod A (lifting assist/Pt. 50-74%/lift 2 legs);4: Chair or W/C > Bed: Min A (steadying Pt. > 75%);4: Bed > Chair or W/C: Min A (steadying Pt. > 75%)     Comprehension Comprehension Mode: Auditory Comprehension: 5-Understands complex 90% of the time/Cues < 10% of the time  Expression Expression Mode: Verbal Expression: 5-Expresses complex 90% of the time/cues < 10% of the time  Social Interaction Social Interaction: 6-Interacts appropriately with others with medication or extra time (anti-anxiety, antidepressant).  Problem Solving Problem Solving: 4-Solves basic 75 - 89% of the time/requires cueing 10 - 24% of the time  Memory Memory: 6-More than reasonable amt of time  Medical Problem List and  Plan:  1. Deconditioning related to necrotizing fasciitis of the penis, leg ulcers, multi-medical  2. DVT Prophylaxis/Anticoagulation: SCDs.  3. Pain Management: Ultram 100 mg twice a day and hydrocodone as needed. Monitor with increased mobility  4.  Neuropsych: This patient is capable of making decisions on his own behalf.  5. Atrial fibrillation. Coumadin discontinued 02/15/2013 with question of Coumadin necrosis lower extremities. Cardiac rate controlled continue amiodarone. Followup cardiology services  6. Necrotizing fasciitis of the penile glans and foreskin. Status post circumcision and debridement 02/04/2013.   -area healing nicely 7. Chronic lower extremity wounds. Suspect calciphylaxis versus Coumadin necrosis. Wound care as advised 8. Chronic hypotension. midodrine 10 mg 3 times a day. Monitor with increased activity  9. Diabetes mellitus with peripheral neuropathy. Check blood sugars a.c. and at bedtime. Lantus insulin 28 units each bedtime. 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 End-stage renal disease. HD per [facility].  12. Chronic anemia. Aranesp weekly  13. Anxiety/restlessness. Ativan 0.5 mg daily  14. Loose stool---send specimen for c dif. immodium prn for loose stool LOS (Days) 2 A FACE TO FACE EVALUATION WAS PERFORMED  Annistyn Depass T 02/18/2013 7:48 AM

## 2013-02-18 NOTE — Progress Notes (Signed)
Occupational Therapy Session Note  Patient Details  Name: Adam [name] MRN: [id number] Date of Birth: 1965-04-20  Today's Date: 02/18/2013 Time: 0800-0900 Time Calculation (min): 60 min  Short Term Goals: Week 1:  OT Short Term Goal 1 (Week 1): UB Bath and Dress: Set up sitting EOB or at sink OT Short Term Goal 2 (Week 1): LB Dressing:Mod Assist in sit and stand with AE PRN. OT Short Term Goal 3 (Week 1): Grooming:  Stand at sink to perform 2 grooming tasks with no more than 2 rest breaks OT Short Term Goal 4 (Week 1): Toilet transfer:  Supervision OT Short Term Goal 5 (Week 1): Toileting:  Mod assist  Skilled Therapeutic Interventions/Progress Updates:    Pt was in supine in bed upon arrival.  Pt transferred from bed to standing utilizing RW and max A.  Pt ambulated with RW with steady A to chair and sat in chair with max A.  Pt bath/dress UB while seated at sink level in chair with set up A.  Pt bathed LB in chair at sink level and therapist donned socks for pt.  Pt transferred from chair to standing utilizing RW with max A and ambulated with RW to EOB.  Nursing notified of replacing wound garments, and therefore pt left at EOB with needs in reach, pt had no c/o pain.  Focus: Increasing independence in ADL performance, increasing activity tolerance, increasing dynamic sitting balance and endurance.   Therapy Documentation Precautions:  Precautions Precautions: Fall;Other (comment) Precaution Comments: Contact  Restrictions Weight Bearing Restrictions: No   See FIM for current functional status  Therapy/Group: Individual Therapy  Shipley,Sheila 02/18/2013, 11:06 AM Note reviewed and accurately reflects treatment session.

## 2013-02-18 NOTE — Progress Notes (Signed)
[facility] Progress Note  Subjective:   C/o wounds bleeding during rehab.   Loose stools per notes but he did not mention.  Objective Filed Vitals:   02/17/13 2040 02/18/13 0113 02/18/13 0500 02/18/13 0601  BP: 91/60 91/63  97/60  Pulse:    116  Temp:    98.2 F (36.8 C)  TempSrc:    Oral  Resp:    18  Weight:   144.3 kg (318 lb 2 oz)   SpO2:    94%   Physical Exam General: alert, cooperative, NAD Heart: RRR Lungs: Grossly clear Abdomen: Obese, soft, +BS Extremities: LE's dark/woody/edematous.  Ulcerations to bilat thighs/calves wrapped.  Dialysis Access:   [facility]  4.75h F180 Bath 2K/2.25Ca DW 143.5kg RFA AVF No heparin  EPO 22K Hect 6ug No Fe at center  Recent labs: Hgb 8.9 12/29, 8.6 11/25 - on 22,000 Epo over a month; tsat 13% 12/18 got venofer 12/29 - ferritin 62 in October; iPTH 157 12/18 improved with normal Ca and P   Assessment/Plan: 1. 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 Deep tissue biopsy planned per surgery once INR < 2 2. Prognosis guarded, but wounds stable; pain controlled.  3. Path 1/09 -"benign skin necrosis"; Coumadin could be culprit. Have been following up-to-date Rx for caclciphylaxis but normal iPTH and P suggest not common with calciphylaxis. S/p 7 days HD, now 5 x weekly with Na thiosulfate Q HD; coumadin was restarted 1/19 but have stopped again and will follow.  4. Daily PT/INR ordered 5. Resume Renvela 2 ac 2. ESRD- plan for serial HD Fri/Sat, then 4 days next week K+ 4.3 with supplementation 3. Chronic hypotension/volume overload - with ++LE  edema, cardiomyopathy w low BP's on midodrine. Continue vol removal, with goal 2-2.5 kg per Rx maintaining SBP >80; repeat Echo 20 - 15% 02/13/13; weights not declining; seems to drink back all the fluid that is taken off; Net UF only 404 cc yesterday. 4. Anemia/CKD - Hgb 9.2 on Aranesp 200 mcg q Thurs, Path negative for calciphylaxis. Recheck iron studies. 5. NICM / bivent HF / LVEF 25% / AICD 6. Pacemaker 7. Morbid obesity 8. Nutrition - heart healthy diet + prostat and resource supplements - alb low; KCL suppl 9. Dispo -  Now in [employer] [facility]. Thad Ranger [facility] Pager [phone] 02/18/2013,1:01 PM  LOS: 2 days    Additional Objective Labs: Basic Metabolic Panel:  Recent Labs Lab 02/15/13 0420 02/16/13 0612 02/17/13 1530 02/18/13 0554  NA 138 137 133* 140  K 3.8 5.1 5.9* 4.3  CL 92* 92* 88* 96  CO2 23 20 13* 23  GLUCOSE 88 146* 122* 114*  BUN 17 20 34* 21  CREATININE 3.08* 3.02* 4.61* 3.54*  CALCIUM 8.8 9.2 9.4 8.7  PHOS 4.2 3.7 5.3*  --    Liver Function Tests:  Recent Labs Lab 02/15/13 0420 02/16/13 0612 02/17/13 1530  ALBUMIN 1.8* 1.8* 2.1*   CBC:  Recent Labs Lab 02/11/13 1352 02/13/13 0535 02/14/13 0419 02/15/13 0420 02/16/13 0612  WBC 10.8* 11.7* 13.3* 12.5* 11.3*  NEUTROABS  --   --  10.8*  --   --  HGB 9.7* 8.9* 9.1* 8.6* 9.2*  HCT 29.6* 27.8* 28.3* 26.6* 28.9*  MCV 88.9 88.3 88.7 88.7 91.5  PLT 329 336 357 355 351   Blood Culture    Component Value Date/Time   SDES URINE, CATHETERIZED 02/06/2013 0611   SPECREQUEST NONE 02/06/2013 0611   CULT  Value: Multiple bacterial morphotypes present, none predominant. Suggest appropriate recollection if clinically indicated. [facility] 02/06/2013 0611   REPTSTATUS 02/08/2013 FINAL 02/06/2013 0611    CBG:  Recent Labs Lab 02/17/13 0722 02/17/13 1159 02/17/13 2131 02/18/13 0657 02/18/13 1146  GLUCAP 132* 151* 106* 118* 90   Studies/Results: No results  found. Medications:   . amiodarone  200 mg Oral BID  . [START ON 02/20/2013] colchicine  0.6 mg Oral Q3 days  . darbepoetin (ARANESP) injection - DIALYSIS  200 mcg Intravenous Q Thu-HD  . diclofenac sodium  2 g Topical QID  . febuxostat  80 mg Oral Daily  . feeding supplement (PRO-STAT SUGAR FREE 64)  30 mL Oral BID WC  . feeding supplement (RESOURCE BREEZE)  1 Container Oral BID BM  . insulin aspart  0-9 Units Subcutaneous TID WC  . insulin glargine  28 Units Subcutaneous QHS  . LORazepam  0.5 mg Oral Daily  . midodrine  10 mg Oral TID WC  . multivitamin  1 tablet Oral QHS  . pantoprazole  40 mg Oral QAC breakfast  . potassium chloride  20 mEq Oral BID  . saccharomyces boulardii  250 mg Oral BID  . traMADol  100 mg Oral BID   I have seen and examined this patient and agree with plan as outlined by K. Broadus JohnWarren, PA-C.  Awaiting punch biopsy on Monday to help discern between coumadin necrosis vs. Calciphylaxis (unfortunately the clinical presentation doesn't support either but are treating for both in the hopes that the results will help guide future therapy given his risk for CVA in setting of A fib). Waris Rodger A,MD 02/18/2013 2:59 PM

## 2013-02-18 NOTE — Consult Note (Signed)
Reason for Consult: punch biopsy of lower extremity wounds Referring Physician: Donato Heinz  Adam [name] is an 48 y.o. male.  HPI: Adam [name] is a 48 year old male with a history of ESRD, DM, chronic anemia, atrial fibrillation, obesity, necrotizing fasciitis who is currently in rehab.  We have been asked to consult for the chronic lower extremity wounds.  The patient reports this developing about 2 weeks ago, shortly after the the necrotizing fasciitis which was debrided by urology.  The patient denies further fever, chills.  No drainage.  Non tender.  Primary team concerned with calciphylaxis versus coumadin toxicity.    Past Medical History  Diagnosis Date  . Other and unspecified hyperlipidemia   . Insomnia, unspecified   . Obstructive sleep apnea (adult) (pediatric)   . Allergic rhinitis, cause unspecified   . Nonischemic cardiomyopathy     a. s/p St. Jude ICD 2011. b. Normal cors 2006. c. EF 25% in 12/2011.  Marland Kitchen Unspecified essential hypertension   . Type II or unspecified type diabetes mellitus without mention of complication, not stated as uncontrolled   . CKD (chronic kidney disease) stage 3, GFR 30-59 ml/min   . Esophageal reflux   . Morbid obesity   . Dysuria   . Tobacco use disorder   . Antral ulcer   . Renal azotemia   . Arthritis     Gout w/hyperuricemia  . Morbid obesity   . Automatic implantable cardioverter-defibrillator in situ   . Calciphylaxis   . PAF (paroxysmal atrial fibrillation)     a. Dx 08/2012.  . Paroxysmal atrial flutter     a. Dx 08/2012.  . Paroxysmal VT     a. 08/2012 in setting of long QT, argatroban  . Chronic combined systolic and diastolic CHF (congestive heart failure)     a. Due to NICM.    Past Surgical History  Procedure Laterality Date  . Cardiac defibrillator placement  2011    St. Jude  . Esophagogastroduodenoscopy  01/03/2011    Procedure: ESOPHAGOGASTRODUODENOSCOPY (EGD);  Surgeon: Winfield Cunas., MD;  Location: [facility]  ENDOSCOPY;  Service: Endoscopy;  Laterality: N/A;  . Av fistula placement Right 08/31/2012    Procedure: ARTERIOVENOUS (AV) FISTULA CREATION;  Surgeon: Angelia Mould, MD;  Location: [place];  Service: Vascular;  Laterality: Right;  . Insertion of dialysis catheter Right 08/31/2012    Procedure: Dickson CATHETER-Right Internal Jugular Placement;  Surgeon: Angelia Mould, MD;  Location: [place];  Service: Vascular;  Laterality: Right;  . Circumcision N/A 02/04/2013    Procedure: CIRCUMCISION ADULT;  Surgeon: Ailene Rud, MD;  Location: [place];  Service: Urology;  Laterality: N/A;  . Irrigation and debridement abscess N/A 02/04/2013    Procedure: PENILE DEBRIDEMENT ;  Surgeon: Ailene Rud, MD;  Location: [place];  Service: Urology;  Laterality: N/A;  . Urethrotomy N/A 02/04/2013    Procedure: CYSTOSCOPY/URETHROTOMY;  Surgeon: Ailene Rud, MD;  Location: [place];  Service: Urology;  Laterality: N/A;  . Circumcision N/A 02/04/2013    Procedure: CIRCUMCISION ADULT;  Surgeon: Burnard Hawthorne, MD;  Location: [place];  Service: Urology;  Laterality: N/A;  . Groin debridement N/A 02/04/2013    Procedure: Penile DEBRIDEMENT;  Surgeon: Burnard Hawthorne, MD;  Location: [place];  Service: Urology;  Laterality: N/A;    Family History  Problem Relation Age of Onset  . Diabetes Mother   . Microcephaly Father   . Lung cancer Father  Social History:  reports that he has been smoking Cigarettes.  He has been smoking about 0.25 packs per day. He has never used smokeless tobacco. He reports that he does not drink alcohol or use illicit drugs.  Allergies:  Allergies  Allergen Reactions  . Argatroban Other (See Comments)    Ventricular tachycardia  . Warfarin And Related Other (See Comments)    Skin necrosis  . Ace Inhibitors     Acute renal failure with multiple trials  . Heparin     Thrombcytopenia. SRA positive. See miscellaneous test (SRA results)     Medications:  Scheduled: . amiodarone  200 mg Oral BID  . [START ON 02/20/2013] colchicine  0.6 mg Oral Q3 days  . darbepoetin (ARANESP) injection - DIALYSIS  200 mcg Intravenous Q Thu-HD  . diclofenac sodium  2 g Topical QID  . febuxostat  80 mg Oral Daily  . feeding supplement (PRO-STAT SUGAR FREE 64)  30 mL Oral BID WC  . feeding supplement (RESOURCE BREEZE)  1 Container Oral BID BM  . insulin aspart  0-9 Units Subcutaneous TID WC  . insulin glargine  28 Units Subcutaneous QHS  . LORazepam  0.5 mg Oral Daily  . midodrine  10 mg Oral TID WC  . multivitamin  1 tablet Oral QHS  . pantoprazole  40 mg Oral QAC breakfast  . potassium chloride  20 mEq Oral BID  . saccharomyces boulardii  250 mg Oral BID  . traMADol  100 mg Oral BID    Results for orders placed during the hospital encounter of 02/16/13 (from the past 48 hour(s))  GLUCOSE, CAPILLARY     Status: Abnormal   Collection Time    02/16/13  8:48 PM      Result Value Range   Glucose-Capillary 133 (*) 70 - 99 mg/dL   Comment 1 Notify RN    GLUCOSE, CAPILLARY     Status: Abnormal   Collection Time    02/17/13  7:22 AM      Result Value Range   Glucose-Capillary 132 (*) 70 - 99 mg/dL  GLUCOSE, CAPILLARY     Status: Abnormal   Collection Time    02/17/13 11:59 AM      Result Value Range   Glucose-Capillary 151 (*) 70 - 99 mg/dL   Comment 1 Notify RN    PROTIME-INR     Status: Abnormal   Collection Time    02/17/13  2:09 PM      Result Value Range   Prothrombin Time 28.9 (*) 11.6 - 15.2 seconds   INR 2.85 (*) 0.00 - 1.49  RENAL FUNCTION PANEL     Status: Abnormal   Collection Time    02/17/13  3:30 PM      Result Value Range   Sodium 133 (*) 137 - 147 mEq/L   Potassium 5.9 (*) 3.7 - 5.3 mEq/L   Chloride 88 (*) 96 - 112 mEq/L   CO2 13 (*) 19 - 32 mEq/L   Glucose, Bld 122 (*) 70 - 99 mg/dL   BUN 34 (*) 6 - 23 mg/dL   Creatinine, Ser 8.38 (*) 0.50 - 1.35 mg/dL   Calcium 9.4  8.4 - 92.8 mg/dL   Phosphorus 5.3  (*) 2.3 - 4.6 mg/dL   Albumin 2.1 (*) 3.5 - 5.2 g/dL   GFR calc non Af Amer 14 (*) >90 mL/min   GFR calc Af Amer 16 (*) >90 mL/min   Comment: (NOTE)     The  eGFR has been calculated using the CKD EPI equation.     This calculation has not been validated in all clinical situations.     eGFR's persistently <90 mL/min signify possible Chronic Kidney     Disease.  GLUCOSE, CAPILLARY     Status: Abnormal   Collection Time    02/17/13  9:31 PM      Result Value Range   Glucose-Capillary 106 (*) 70 - 99 mg/dL   Comment 1 Notify RN    BASIC METABOLIC PANEL     Status: Abnormal   Collection Time    02/18/13  5:54 AM      Result Value Range   Sodium 140  137 - 147 mEq/L   Comment: DELTA CHECK NOTED   Potassium 4.3  3.7 - 5.3 mEq/L   Comment: DELTA CHECK NOTED   Chloride 96  96 - 112 mEq/L   CO2 23  19 - 32 mEq/L   Glucose, Bld 114 (*) 70 - 99 mg/dL   BUN 21  6 - 23 mg/dL   Comment: DELTA CHECK NOTED   Creatinine, Ser 3.54 (*) 0.50 - 1.35 mg/dL   Calcium 8.7  8.4 - 10.5 mg/dL   GFR calc non Af Amer 19 (*) >90 mL/min   GFR calc Af Amer 22 (*) >90 mL/min   Comment: (NOTE)     The eGFR has been calculated using the CKD EPI equation.     This calculation has not been validated in all clinical situations.     eGFR's persistently <90 mL/min signify possible Chronic Kidney     Disease.  GLUCOSE, CAPILLARY     Status: Abnormal   Collection Time    02/18/13  6:57 AM      Result Value Range   Glucose-Capillary 118 (*) 70 - 99 mg/dL    No results found.  Review of Systems  Constitutional: Negative for fever and chills.  Respiratory: Negative for shortness of breath.   Cardiovascular: Negative for chest pain and palpitations.  Gastrointestinal: Negative for nausea, vomiting and abdominal pain.  Genitourinary:       Bleeding  Endo/Heme/Allergies: Bruises/bleeds easily.   Blood pressure 97/60, pulse 116, temperature 98.2 F (36.8 C), temperature source Oral, resp. rate 18, weight 318  lb 2 oz (144.3 kg), SpO2 94.00%. Physical Exam  Constitutional: He appears well-developed and well-nourished. No distress.  Cardiovascular: Regular rhythm, normal heart sounds and intact distal pulses.  Exam reveals no gallop and no friction rub.   No murmur heard. Respiratory: Effort normal and breath sounds normal. No respiratory distress.  GI: Soft. Bowel sounds are normal.  Skin: Skin is warm. He is not diaphoretic. No pallor.  Proximal aspect of BLE, large dry, scaly wounds without erythema, tenderness or warmth.      Assessment/Plan: BLE wounds, etiology unclear. We will plan for a punch biopsy when INR is less than 2.  It was 2.8(1/22).  Daily INRs per primary team.  i discussed with the patient the risks of the procedure including bleeding, infection and further procedures.  Her verbalizes understanding.    Daijha Leggio ANP-BC 02/18/2013, 9:33 AM

## 2013-02-18 NOTE — Progress Notes (Signed)
Physical Therapy Session Note  Patient Details  Name: Adam [name] MRN: [id number] Date of Birth: 14-Jul-1965  Today's Date: 02/18/2013 Time: Treatment Session 1: 1007-1219; Treatment Session 2: 1330-1400 Time Calculation (min): Treatment Session 1: ; Treatment Session 2:  Short Term Goals: Week 1:  PT Short Term Goal 1 (Week 1): Pt to perform bed mobility w/ min A PT Short Term Goal 2 (Week 1): Pt to perform transfers w/ RW and (S) PT Short Term Goal 3 (Week 1): Pt to propel w/c 75' w/ min A PT Short Term Goal 4 (Week 1): Pt to amb 15' w/ RW and min guard A PT Short Term Goal 5 (Week 1): Pt to tolerate standing x35min w/ RW and min guard assist  Skilled Therapeutic Interventions/Progress Updates:  Treatment Session 1:  1:1. Pt received semi-reclined in bed upon entry, ready for therapy. Mod A for t/f sup<>sit EOB w/ HOB flat and use of bed rails. Pt req max encouragement to amb 2/2 pain, able to amb 10'x2 w/ RW and min guard assist. Min guard for t/f sit<>stand 2x5reps from elevated hospital bed for emphasis on strength/endurance, but Ax2persons for sit<>stand from high back chair in room. Seated therex including: 2x10 reps of heel/toe raises, LAQ and marching (assisted). Seated rest throughout session 2/2 fatigue. Pt sitting EOB at end of tx session w/ RN present at end of session to address bandages.   Treatment Session 2:  1:1. Pt received sitting EOB, ready to use BSC. Min guard assist for SPT bed>BSC w/ use of RW. Therapist nearby, but pt initiating movement back to bed w/out telling therapist despite verbalized understanding by pt to let therapist know when he was ready. Re-educated regarding safety and fall prevention. Focus on standing tolerance w/ RW and close (S) while RN changing bandages on bottom. Pt able to amb 20' in room bed<>doorway w/ RW and close(S) to min guard assist. Pt declining further participation in therapy 2/2 fatigue despite encouragement. Mod A for t/f  sit>sup at end of session. Bed alarm on, all needs in reach and girlfriend in room.   Therapy Documentation Precautions:  Precautions Precautions: Fall;Other (comment) Precaution Comments: Contact  Restrictions Weight Bearing Restrictions: No   Pain: Pain Assessment Pain Assessment: 0-10 Pain Score: 8  Pain Location: Leg Pain Orientation: Left;Right Pain Descriptors / Indicators: Aching Pain Intervention(s): Repositioned;Distraction (Pt reports receiving pain meds this AM)  See FIM for current functional status  Therapy/Group: Individual Therapy  Denzil Hughes 02/18/2013, 10:51 AM

## 2013-02-19 ENCOUNTER — Inpatient Hospital Stay (HOSPITAL_COMMUNITY): Payer: Medicaid Other | Admitting: Physical Therapy

## 2013-02-19 LAB — GLUCOSE, CAPILLARY
GLUCOSE-CAPILLARY: 122 mg/dL — AB (ref 70–99)
Glucose-Capillary: 74 mg/dL (ref 70–99)
Glucose-Capillary: 142 mg/dL — ABNORMAL HIGH (ref 70–99)

## 2013-02-19 LAB — PROTIME-INR
INR: 2.85 — AB (ref 0.00–1.49)
Prothrombin Time: 28.9 seconds — ABNORMAL HIGH (ref 11.6–15.2)

## 2013-02-19 MED ORDER — WHITE PETROLATUM GEL
Status: AC
  Administered 2013-02-20: 0.2 via TOPICAL
  Filled 2013-02-19: qty 5

## 2013-02-19 NOTE — Progress Notes (Signed)
[facility]   Subjective/Complaints: Stools less. Wants HD this morning (scheduled for later). Patient refusing to wear mesh briefs which hold dressing in place over penis. Sore from therapies yesterday. A 12 point review of systems has been performed and if not noted above is otherwise negative.   Objective: Vital Signs: Blood pressure 93/64, pulse 100, temperature 97.8 F (36.6 C), temperature source Oral, resp. rate 19, weight 141.1 kg (311 lb 1.1 oz), SpO2 94.00%. No results found. No results found for this basename: WBC, HGB, HCT, PLT,  in the last 72 hours  Recent Labs  02/17/13 1530 02/18/13 0554  NA 133* 140  K 5.9* 4.3  CL 88* 96  GLUCOSE 122* 114*  BUN 34* 21  CREATININE 4.61* 3.54*  CALCIUM 9.4 8.7   CBG (last 3)   Recent Labs  02/18/13 1146 02/18/13 2006 02/19/13 0715  GLUCAP 90 77 74    Wt Readings from Last 3 Encounters:  02/19/13 141.1 kg (311 lb 1.1 oz)  02/16/13 139.3 kg (307 lb 1.6 oz)  02/16/13 139.3 kg (307 lb 1.6 oz)    Physical Exam:  Constitutional: He is oriented to person, place, and time.  Morbidly obese  HENT:  Head: Normocephalic.  Eyes: EOM are normal.  Neck: Normal range of motion. Neck supple. No thyromegaly present.  Cardiovascular: Regular rhythm and normal heart sounds.  Cardiac rate controlled  Respiratory: Effort normal and breath sounds normal.  GI: Soft. Bowel sounds are normal. He exhibits no distension.  obese  Musculoskeletal:  RLE 2++ edema, chronic stasis changes. LLE 1+ to 2+ with stasis changes as well.  Neurological: He is alert and oriented to person, place, and time.  UE's grossly 4+ to 5/5. LLE 2/5 HF, 2/5 KE, 3+ ADF and APF. RLE: 1+ HF, 2- KE, ADF and APF 3/5. Mild sensory changes in feet to LT. Reasonable insight and awareness. CN exam non-specific.  Skin:  Thigh wounds with eschar, somewhat dry, non-draining dressed with gauze, petroleum gauze, tender. Penile shaft clean  with dressing.  Chronic stasis changes in the legs as noted above. Psychiatric:  Generally appropriate  Assessment/Plan: 1. Functional deficits secondary to deconditioning due to necrotizing fasciitis of penis, multiple medical issues which require 3+ hours per day of interdisciplinary therapy in a comprehensive inpatient rehab setting. Physiatrist is providing close team supervision and 24 hour management of active medical problems listed below. Physiatrist and rehab team continue to assess barriers to discharge/monitor patient progress toward functional and medical goals. FIM: FIM - Bathing Bathing Steps Patient Completed: Chest;Right Arm;Left Arm;Abdomen;Front perineal area;Right upper leg;Left upper leg;Right lower leg (including foot);Left lower leg (including foot) Bathing: 5: Set-up assist to: Obtain items  FIM - Upper Body Dressing/Undressing Upper body dressing/undressing steps patient completed: Thread/unthread right sleeve of pullover shirt/dresss;Thread/unthread left sleeve of pullover shirt/dress;Put head through opening of pull over shirt/dress;Pull shirt over trunk Upper body dressing/undressing: 5: Set-up assist to: Obtain clothing/put away FIM - Lower Body Dressing/Undressing Lower body dressing/undressing: 0: Activity did not occur        FIM - BankerBed/Chair Transfer Bed/Chair Transfer Assistive Devices: Walker;Bed rails Bed/Chair Transfer: 3: Supine > Sit: Mod A (lifting assist/Pt. 50-74%/lift 2 legs  FIM - Locomotion: Wheelchair Locomotion: Wheelchair: 0: Activity did not occur FIM - Locomotion: Ambulation Locomotion: Ambulation Assistive Devices: Walker - Rolling Locomotion: Ambulation: 1: Travels less than 50 ft with minimal assistance (Pt.>75%)  Comprehension Comprehension Mode: Auditory Comprehension: 5-Understands complex 90% of the time/Cues < 10% of  the time  Expression Expression Mode: Verbal Expression: 5-Expresses complex 90% of the time/cues < 10% of  the time  Social Interaction Social Interaction: 6-Interacts appropriately with others with medication or extra time (anti-anxiety, antidepressant).  Problem Solving Problem Solving: 4-Solves basic 75 - 89% of the time/requires cueing 10 - 24% of the time  Memory Memory: 6-More than reasonable amt of time  Medical Problem List and Plan:  1. Deconditioning related to necrotizing fasciitis of the penis, leg ulcers, multi-medical  2. DVT Prophylaxis/Anticoagulation: SCDs.  3. Pain Management: Ultram 100 mg twice a day and hydrocodone as needed. Monitor with increased mobility  4. Neuropsych: This patient is capable of making decisions on his own behalf.  5. Atrial fibrillation. Coumadin discontinued 02/15/2013 with question of Coumadin necrosis lower extremities. Cardiac rate controlled continue amiodarone. Followup cardiology services  6. Necrotizing fasciitis of the penile glans and foreskin. Status post circumcision and debridement 02/04/2013.   -reviewed wound care with patient and RN 7. Chronic lower extremity wounds. Suspect calciphylaxis versus Coumadin necrosis. Wound care as advised 8. Chronic hypotension. midodrine 10 mg 3 times a day. Monitor with increased activity  9. Diabetes mellitus with peripheral neuropathy. Check blood sugars a.c. and at bedtime. Lantus insulin 28 units each bedtime. 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 End-stage renal disease. HD per [facility].  12. Chronic anemia. Aranesp weekly  13. Anxiety/restlessness. Ativan 0.5 mg daily  14. Loose stool---decreasing. c diff neg. Imodium prn LOS (Days) 3 A FACE TO FACE EVALUATION WAS PERFORMED  Adam [name] T 02/19/2013 8:57 AM

## 2013-02-19 NOTE — Progress Notes (Signed)
Physical Therapy Note  Patient Details  Name: DAEQUAN VIGGIANO MRN: [id number] Date of Birth: [birth year] Today's Date: 02/19/2013  1130 -1200 (30 minutes) individual Pain: no reported pain Focus of treatment: therapeutic exercise focused on bilateral LE strengthening; standing tolerance with AD Treatment: Pt sitting edge of bed upon arrival ; pt states that he does not want to transfer to recliner (no wc available in room) secondary to skin issues; sit to stand x 3 from bed to RW mod assist; pt tolerates standing less than one minute with RW; ;sit to supine mod assist bilateral LEs; bilateral LE manual resistance for leg presses, hip abduction (AA); bed alarm activated.    Yovani Cogburn,JIM 02/19/2013, 11:57 AM

## 2013-02-19 NOTE — Progress Notes (Signed)
Patient ID: Adam [name], male   DOB: August 29, 1965, 48 y.o.   MRN: [id number]  [facility] Progress Note    Subjective:   Feels weak and sore from therapy   Objective:   BP 93/64  Pulse 100  Temp(Src) 97.8 F (36.6 C) (Oral)  Resp 19  Wt 141.1 kg (311 lb 1.1 oz)  SpO2 94%  Intake/Output: I/O last 3 completed shifts: In: 720 [P.O.:720] Out: 1828 [Other:1828]   Intake/Output this shift:    Weight change: -1.1 kg (-2 lb 6.8 oz)  Physical Exam: Gen:WD obese AAM in NAD DHR:CBULA Ext:+indurated and ulcerated lesions of both thighs  Labs: BMET  Recent Labs Lab 02/13/13 0535 02/14/13 0419 02/15/13 0420 02/16/13 0612 02/17/13 1530 02/18/13 0554  NA 140 141 138 137 133* 140  K 4.1 4.1 3.8 5.1 5.9* 4.3  CL 91* 92* 92* 92* 88* 96  CO2 21 20 23 20  13* 23  GLUCOSE 175* 156* 88 146* 122* 114*  BUN 17 18 17 20  34* 21  CREATININE 2.95* 3.04* 3.08* 3.02* 4.61* 3.54*  ALBUMIN 1.7* 1.7* 1.8* 1.8* 2.1*  --   CALCIUM 8.5 8.9 8.8 9.2 9.4 8.7  PHOS 3.9 4.0 4.2 3.7 5.3*  --    CBC  Recent Labs Lab 02/13/13 0535 02/14/13 0419 02/15/13 0420 02/16/13 0612  WBC 11.7* 13.3* 12.5* 11.3*  NEUTROABS  --  10.8*  --   --   HGB 8.9* 9.1* 8.6* 9.2*  HCT 27.8* 28.3* 26.6* 28.9*  MCV 88.3 88.7 88.7 91.5  PLT 336 357 355 351    @IMGRELPRIORS @ Medications:    . amiodarone  200 mg Oral BID  . [START ON 02/20/2013] colchicine  0.6 mg Oral Q3 days  . darbepoetin (ARANESP) injection - DIALYSIS  200 mcg Intravenous Q Thu-HD  . diclofenac sodium  2 g Topical QID  . febuxostat  80 mg Oral Daily  . feeding supplement (PRO-STAT SUGAR FREE 64)  30 mL Oral BID WC  . feeding supplement (RESOURCE BREEZE)  1 Container Oral BID BM  . insulin aspart  0-9 Units Subcutaneous TID WC  . insulin glargine  28 Units Subcutaneous QHS  . LORazepam  0.5 mg Oral Daily  . midodrine  10 mg Oral TID WC  . multivitamin  1 tablet Oral QHS  . pantoprazole  40 mg Oral QAC breakfast  . potassium  chloride  20 mEq Oral BID  . saccharomyces boulardii  250 mg Oral BID  . sevelamer carbonate  1,600 mg Oral TID WC  . traMADol  100 mg Oral BID     D[facility]: [facility]  4.75h F180 Bath 2K/2.25Ca DW 143.5kg RFA AVF No heparin  EPO 22K Hect No Fe at center   Assessment/Plan:  1. 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 Deep tissue biopsy could help determine between the 2 as the treatment for these are quite different, however there have been cases caused by the interaction of coumadin and mineral metabolism disorders in pts with ESRD. Plan if for surgery to obtain deep tissue punch biopsy to help guide management on Monday.  2. For now we are treating  both with aggressive HD, sodium thiosulfate, non-calcium based binders, no vit D, sensipar, and no coumadin. 3. prognosis guarded, but wounds stable; pain controlled.  2. ESRD- plan for HD ThFSa, then 4 days next week  1. Short session on Monday and back to regular TTS schedule 3. Chronic hypotension/volume overload - with ++LE edema, cardiomyopathy w low BP's on midodrine. Continue vol removal, with goal 2-2.5 kg per Rx maintaining SBP >80; repeat Echo 20 - 15% 02/13/13; weights not declining bec; seems to drink back all the fluid that is taken off; only UF 1.4 Monday 4. Anemia/CKD - Hgb 9.1 on Aranesp 200 mcg on Thurs, no IV Fe due to possibility of calciphylaxis- Hgb 12.7 today - doesn't make sense - recheck Thursday - hold Aranesp if this is true 5. NICM / bivent HF / LVEF 25% / AICD 6. Pacemaker 7. Morbid obesity 8. Nutrition - heart healthy diet + prostat and resource  supplements - alb low; KCL suppl 9. Dispo- s/p transfer to CIR  10.  Roshawn Ayala A 02/19/2013, 12:04 PM

## 2013-02-19 NOTE — Progress Notes (Signed)
  Subjective: Patient reports No penile pain  Objective: Vital signs in last 24 hours: Temp:  [97.5 F (36.4 C)-97.9 F (36.6 C)] 97.9 F (36.6 C) (01/24 1513) Pulse Rate:  [93-114] 99 (01/24 1830) Resp:  [18-19] 18 (01/24 1540) BP: (87-119)/(57-73) 91/60 mmHg (01/24 1830) SpO2:  [92 %-95 %] 94 % (01/24 1540) Weight:  [140.8 kg (310 lb 6.5 oz)-141.9 kg (312 lb 13.3 oz)] 141.9 kg (312 lb 13.3 oz) (01/24 1540)  Intake/Output from previous day: 01/23 0701 - 01/24 0700 In: 720 [P.O.:720] Out: 1828  Intake/Output this shift: Total I/O In: 360 [P.O.:360] Out: -   Physical Exam:   Voids small amount of urine.  Bladder not distended. Glans penis: hard. Eschar over glans penis.  Moderate amount of exudate at coronal sulcus.  Lab Results: No results found for this basename: HGB, HCT,  in the last 72 hours BMET  Recent Labs  02/17/13 1530 02/18/13 0554  NA 133* 140  K 5.9* 4.3  CL 88* 96  CO2 13* 23  GLUCOSE 122* 114*  BUN 34* 21  CREATININE 4.61* 3.54*  CALCIUM 9.4 8.7    Recent Labs  02/17/13 1409 02/19/13 0523  INR 2.85* 2.85*   No results found for this basename: LABURIN,  in the last 72 hours Results for orders placed during the hospital encounter of 02/16/13  CLOSTRIDIUM DIFFICILE BY PCR     Status: None   Collection Time    02/18/13  9:36 AM      Result Value Range Status   C difficile by pcr NEGATIVE  NEGATIVE Final    Studies/Results: No results found.  Assessment/Plan:  Penile necrosis  Wet to dry penile dressing.  Bladder scan   LOS: 3 days   Lindaann Slough 02/19/2013, 6:45 PM

## 2013-02-20 ENCOUNTER — Inpatient Hospital Stay (HOSPITAL_COMMUNITY): Payer: Medicaid Other | Admitting: Physical Therapy

## 2013-02-20 ENCOUNTER — Inpatient Hospital Stay (HOSPITAL_COMMUNITY): Payer: Medicaid Other

## 2013-02-20 ENCOUNTER — Encounter (HOSPITAL_COMMUNITY): Payer: Medicaid Other

## 2013-02-20 DIAGNOSIS — I4891 Unspecified atrial fibrillation: Secondary | ICD-10-CM

## 2013-02-20 LAB — GLUCOSE, CAPILLARY
Glucose-Capillary: 121 mg/dL — ABNORMAL HIGH (ref 70–99)
Glucose-Capillary: 143 mg/dL — ABNORMAL HIGH (ref 70–99)
Glucose-Capillary: 147 mg/dL — ABNORMAL HIGH (ref 70–99)
Glucose-Capillary: 154 mg/dL — ABNORMAL HIGH (ref 70–99)

## 2013-02-20 LAB — CK TOTAL AND CKMB (NOT AT ARMC)
CK, MB: 1.2 ng/mL (ref 0.3–4.0)
Relative Index: INVALID (ref 0.0–2.5)
Total CK: 28 U/L (ref 7–232)

## 2013-02-20 LAB — PROTIME-INR
INR: 2.77 — AB (ref 0.00–1.49)
Prothrombin Time: 28.3 seconds — ABNORMAL HIGH (ref 11.6–15.2)

## 2013-02-20 MED ORDER — SODIUM CHLORIDE 0.9 % IV BOLUS (SEPSIS)
250.0000 mL | Freq: Once | INTRAVENOUS | Status: AC
  Administered 2013-02-20: 250 mL via INTRAVENOUS

## 2013-02-20 MED ORDER — PHYTONADIONE 5 MG PO TABS
5.0000 mg | ORAL_TABLET | Freq: Once | ORAL | Status: AC
  Administered 2013-02-20: 5 mg via ORAL
  Filled 2013-02-20: qty 1

## 2013-02-20 MED ORDER — AMIODARONE HCL 200 MG PO TABS
200.0000 mg | ORAL_TABLET | Freq: Once | ORAL | Status: AC
  Administered 2013-02-20: 200 mg via ORAL
  Filled 2013-02-20: qty 1

## 2013-02-20 MED ORDER — AMIODARONE HCL 200 MG PO TABS
400.0000 mg | ORAL_TABLET | Freq: Two times a day (BID) | ORAL | Status: DC
  Administered 2013-02-20 – 2013-02-21 (×2): 400 mg via ORAL
  Filled 2013-02-20 (×5): qty 2

## 2013-02-20 MED ORDER — AMIODARONE HCL 200 MG PO TABS: 200.0000 mg | ORAL_TABLET | Freq: Every day | ORAL | Status: DC

## 2013-02-20 MED ORDER — SODIUM CHLORIDE 0.9 % IV BOLUS (SEPSIS): 250.0000 mL | Freq: Once | INTRAVENOUS | Status: DC

## 2013-02-20 NOTE — Progress Notes (Signed)
Physical Therapy Note  Patient Details  Name: Adam [name] MRN: [id number] Date of Birth: 1965/05/25 Today's Date: 02/20/2013  Pt missed 60 mins skilled OT services.  Pt receiving RN care during scheduled time (EKG, IV fluids, etc.)    Rich Brave 02/20/2013, 8:57 AM

## 2013-02-20 NOTE — Progress Notes (Cosign Needed)
Occupational Therapy Session Note  Patient Details  Name: Adam [name] MRN: [id number] Date of Birth: [birth year]  Today's Date: 02/20/2013 Time: 1330-1400 Time Calculation (min): 30 min  Short Term Goals: Week 1:  OT Short Term Goal 1 (Week 1): UB Bath and Dress: Set up sitting EOB or at sink OT Short Term Goal 2 (Week 1): LB Dressing:Mod Assist in sit and stand with AE PRN. OT Short Term Goal 3 (Week 1): Grooming:  Stand at sink to perform 2 grooming tasks with no more than 2 rest breaks OT Short Term Goal 4 (Week 1): Toilet transfer:  Supervision OT Short Term Goal 5 (Week 1): Toileting:  Mod assist  Skilled Therapeutic Interventions/Progress Updates:    Pt was in bed upon arrival.  Pt initial O2 stats were taken and recorded 85% and HR 130 while lying supine in bed.  Pt participated in therapeutic exercise of B UE utilizing 5lb dowel rod doing 3 sets of overhead shoulder presses and 3 sets of chest presses.  Pt required multiple rest breaks. O2 stats were taken after activity and O2 recorded at 84% and HR was 136.  Pt participated in raising 3lb dowel rod with B UE in shoulder flexion 5 X 3 sets with breaks. Pt required max verbal cues to keep elbows straight when performing exercise.  Pt stats were 84% O2 and HR of 112 after exercise. Pt had no c/o pain, but did complain of being "wiped out from dialysis." Focus on therapeutic exercise, reconditioning and increasing activity tolerance.    Therapy Documentation Precautions:  Precautions Precautions: Fall;Other (comment) Precaution Comments: Contact  Restrictions Weight Bearing Restrictions: No Pain: Pain Assessment Pain Assessment: 0-10 Pain Score:  (better) Pain Type: Acute pain Pain Location: Leg Pain Descriptors / Indicators: Sore Pain Onset: On-going Pain Intervention(s): Medication (See eMAR)     See FIM for current functional status  Therapy/Group: Individual Therapy  Laretha Luepke 02/20/2013, 2:52 PM

## 2013-02-20 NOTE — Progress Notes (Signed)
Patient ID: Adam [name], male   DOB: [birth year], 48 y.o.   MRN: [id number]     Primary cardiologist:  Subjective:    Palpitations overnight and this morning intermittently  Objective:   Temp:  [96.9 F (36.1 C)-97.9 F (36.6 C)] 97.9 F (36.6 C) (01/25 0642) Pulse Rate:  [90-120] 117 (01/25 1120) Resp:  [16-19] 18 (01/25 1120) BP: (82-119)/(56-73) 96/62 mmHg (01/25 1120) SpO2:  [86 %-98 %] 90 % (01/25 1120) Weight:  [307 lb 8.7 oz (139.5 kg)-312 lb 13.3 oz (141.9 kg)] 312 lb 9.8 oz (141.8 kg) (01/25 0642) Last BM Date: 02/19/13  Filed Weights   02/19/13 1935 02/19/13 2020 02/20/13 0642  Weight: 307 lb 8.7 oz (139.5 kg) 310 lb 13.6 oz (141 kg) 312 lb 9.8 oz (141.8 kg)    Intake/Output Summary (Last 24 hours) at 02/20/13 1247 Last data filed at 02/19/13 2211  Gross per 24 hour  Intake    360 ml  Output   2000 ml  Net  -1640 ml    Telemetry: No tele  Exam:  General: NAD  Resp: CTAB  Cardiac: irreg, no m/r/g, no JVD  NW:GNFAOZHGI:abdomen soft, NT, ND  Neuro: no focal deficits  Psych: appropriate affect  Lab Results:  Basic Metabolic Panel:  Recent Labs Lab 02/16/13 0612 02/17/13 1530 02/18/13 0554  NA 137 133* 140  K 5.1 5.9* 4.3  CL 92* 88* 96  CO2 20 13* 23  GLUCOSE 146* 122* 114*  BUN 20 34* 21  CREATININE 3.02* 4.61* 3.54*  CALCIUM 9.2 9.4 8.7    Liver Function Tests:  Recent Labs Lab 02/15/13 0420 02/16/13 0612 02/17/13 1530  ALBUMIN 1.8* 1.8* 2.1*    CBC:  Recent Labs Lab 02/14/13 0419 02/15/13 0420 02/16/13 0612  WBC 13.3* 12.5* 11.3*  HGB 9.1* 8.6* 9.2*  HCT 28.3* 26.6* 28.9*  MCV 88.7 88.7 91.5  PLT 357 355 351    Cardiac Enzymes:  Recent Labs Lab 02/20/13 0955  CKTOTAL 28  CKMB 1.2    BNP:  Recent Labs  08/06/12 1455 08/09/12 1109 08/11/12 1110  PROBNP 1137.0* 1018.0* 939.0*    Coagulation:  Recent Labs Lab 02/17/13 1409 02/19/13 0523 02/20/13 0653  INR 2.85* 2.85* 2.77*    ECG:   Medications:     Scheduled Medications: . amiodarone  200 mg Oral BID  . colchicine  0.6 mg Oral Q3 days  . darbepoetin (ARANESP) injection - DIALYSIS  200 mcg Intravenous Q Thu-HD  . diclofenac sodium  2 g Topical QID  . febuxostat  80 mg Oral Daily  . feeding supplement (PRO-STAT SUGAR FREE 64)  30 mL Oral BID WC  . feeding supplement (RESOURCE BREEZE)  1 Container Oral BID BM  . insulin aspart  0-9 Units Subcutaneous TID WC  . insulin glargine  28 Units Subcutaneous QHS  . LORazepam  0.5 mg Oral Daily  . midodrine  10 mg Oral TID WC  . multivitamin  1 tablet Oral QHS  . pantoprazole  40 mg Oral QAC breakfast  . phytonadione  5 mg Oral Once  . potassium chloride  20 mEq Oral BID  . saccharomyces boulardii  250 mg Oral BID  . sevelamer carbonate  1,600 mg Oral TID WC  . traMADol  100 mg Oral BID     Infusions:     PRN Medications:  acetaminophen, fluticasone, HYDROcodone-acetaminophen, loperamide, loratadine, ondansetron (ZOFRAN) IV, ondansetron, sorbitol, white petrolatum     Assessment/Plan    48 yo male  history of NICM, ESRD, DM, HIT, CVA, afib, and VT admitted with    1. Afib CHADS2Vasc score of 3, off coumadin due to history of calciphylaxis and wounds of lower extremities - management has been limited by structural heart disease, ESRD, and hypotension especially during HD. Has been managed with amio - EKG shows afib with RVR 120, limited therapeutic options. Will increase amio to 400mg  bid for a abbreviated reloading course of 7 days, then resume prior dosing.   2. 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 Chronic systolic heart failure - LVEF 73%, prior AICD placement. Medical therapy has been limited due to low bps'.  - appears euvolemic  4. Calciphylaxis - per nephrology   Dina Rich, M.D., F.A.C.C.

## 2013-02-20 NOTE — Progress Notes (Signed)
Patient vitals checked at 0810 and patient heart rate at 120, BP 88/56 and oxygen 86 on RA. Patient reports "don't feel good" however patient denies any shortness of breath or chest pain. Dr Riley Kill notified. New orders received. Bolus completed this shift. Patient's systolic blood pressure remains in the 80s. Patient reports feeling weak but denies any dizziness. Patient dressing changes done at approximately 1650. After patient set back up patient felt nauseaus and vomited a small amount of partially digested food. Patient refused nausea medication at this time. Patient has also had intermittent sweating episodes this evening. Dr Riley Kill notified. No new orders received. Continue to encourage fluids and eating at this time.  Blood pressure this evening is 87/57 with HR 116. Continue to monitor. Amiodarone increased per Cardiology. Patient very drowsy this shift but does follow commands and answers questions appropriately.

## 2013-02-20 NOTE — Progress Notes (Signed)
Physical Therapy Note  Patient Details  Name: Adam [name] MRN: [id number] Date of Birth: 1965-03-06 Today's Date: 02/20/2013  1000 -1055 (55 minutes) individual Pain: 4/10 scrotum/ premedicated Other: BP sitting 93/63 , pulse 126, oxygen sats 90 % on 2L [place] Focus of treatment: sit to stand ; standing tolerance; transfer training with RW Treatment: Pt sitting edge of bed finishing with nursing issues; sit to stand X 3 from raised  Bed min to SBA to RW; pt tolerates standing one minute or less; transfer bed to 24 inch wide bariatric BSC SBA x 2; sit to supine mod assist ; scooting up in bed +2 assist; all needs within reach.   1315-1330 (15 minutes) individual (missed 15 minutes secondary to nursing care/ MD visit) Pain: no reported pain Focus of treatment : bilateral LE strengthening Treatment: Therapeutic exercise X 20 (bilaterally) - ankle pumps, heel slides (AA), hip abduction (AA), quad sets.   Tyteanna Ost,JIM 02/20/2013, 10:55 AM

## 2013-02-20 NOTE — Progress Notes (Signed)
Returned from HD with complain of LLE pain, managed with scheduled ultram. Reports having "coughing fit" with subsequent N & V. Partially digested emesis.  PRN zofran given. PVR at HS=54ml. Dressing changed to penis per orders-wound with odor. Alfredo Martinez A

## 2013-02-20 NOTE — Progress Notes (Signed)
  Subjective: Patient reports No pain  Objective: Vital signs in last 24 hours: Temp:  [96.9 F (36.1 C)-97.9 F (36.6 C)] 97.9 F (36.6 C) (01/25 6381) Pulse Rate:  [90-120] 117 (01/25 1120) Resp:  [16-19] 18 (01/25 1120) BP: (82-119)/(56-73) 96/62 mmHg (01/25 1120) SpO2:  [86 %-98 %] 90 % (01/25 1120) Weight:  [139.5 kg (307 lb 8.7 oz)-141.9 kg (312 lb 13.3 oz)] 141.8 kg (312 lb 9.8 oz) (01/25 7711)  Intake/Output from previous day: 01/24 0701 - 01/25 0700 In: 600 [P.O.:600] Out: 2000  Intake/Output this shift:    Physical Exam:  Bladder scan last night: 35 cc. Has some exudate penile skin  Lab Results: No results found for this basename: HGB, HCT,  in the last 72 hours BMET  Recent Labs  02/17/13 1530 02/18/13 0554  NA 133* 140  K 5.9* 4.3  CL 88* 96  CO2 13* 23  GLUCOSE 122* 114*  BUN 34* 21  CREATININE 4.61* 3.54*  CALCIUM 9.4 8.7    Recent Labs  02/17/13 1409 02/19/13 0523 02/20/13 0653  INR 2.85* 2.85* 2.77*   No results found for this basename: LABURIN,  in the last 72 hours Results for orders placed during the hospital encounter of 02/16/13  CLOSTRIDIUM DIFFICILE BY PCR     Status: None   Collection Time    02/18/13  9:36 AM      Result Value Range Status   C difficile by pcr NEGATIVE  NEGATIVE Final    Studies/Results: No results found.  Assessment/Plan:  Necrosis of penis  Continue wet to dry penile dressing   LOS: 4 days   Lindaann Slough 02/20/2013, 1:11 PM

## 2013-02-20 NOTE — Progress Notes (Signed)
[facility]   Subjective/Complaints: bp low this am. HR increased as well. Pt denies complaints. Had HD last night. A 12 point review of systems has been performed and if not noted above is otherwise negative.   Objective: Vital Signs: Blood pressure 88/60, pulse 111, temperature 97.9 F (36.6 C), temperature source Oral, resp. rate 19, weight 141.8 kg (312 lb 9.8 oz), SpO2 98.00%. No results found. No results found for this basename: WBC, HGB, HCT, PLT,  in the last 72 hours  Recent Labs  02/17/13 1530 02/18/13 0554  NA 133* 140  K 5.9* 4.3  CL 88* 96  GLUCOSE 122* 114*  BUN 34* 21  CREATININE 4.61* 3.54*  CALCIUM 9.4 8.7   CBG (last 3)   Recent Labs  02/19/13 1218 02/19/13 2010 02/20/13 0714  GLUCAP 142* 122* 121*    Wt Readings from Last 3 Encounters:  02/20/13 141.8 kg (312 lb 9.8 oz)  02/16/13 139.3 kg (307 lb 1.6 oz)  02/16/13 139.3 kg (307 lb 1.6 oz)    Physical Exam:  Constitutional: He is oriented to person, place, and time.  Morbidly obese  HENT:  Head: Normocephalic.  Eyes: EOM are normal.  Neck: Normal range of motion. Neck supple. No thyromegaly present.  Cardiovascular: Regular rhythm and normal heart sounds.  Cardiac rate controlled  Respiratory: Effort normal and breath sounds normal.  GI: Soft. Bowel sounds are normal. He exhibits no distension.  obese  Musculoskeletal:  RLE 2++ edema, chronic stasis changes. LLE 1+ to 2+ with stasis changes as well.  Neurological: He is alert and oriented to person, place, and time.  UE's grossly 4+ to 5/5. LLE 2/5 HF, 2/5 KE, 3+ ADF and APF. RLE: 1+ HF, 2- KE, ADF and APF 3/5. Mild sensory changes in feet to LT. Reasonable insight and awareness. CN exam non-specific.  Skin:  Thigh wounds with eschar, somewhat dry, non-draining dressed with gauze, petroleum gauze, tender. Penile shaft clean with dressing.  Chronic stasis changes in the legs as noted above. Psychiatric:   Generally appropriate  Assessment/Plan: 1. Functional deficits secondary to deconditioning due to necrotizing fasciitis of penis, multiple medical issues which require 3+ hours per day of interdisciplinary therapy in a comprehensive inpatient rehab setting. Physiatrist is providing close team supervision and 24 hour management of active medical problems listed below. Physiatrist and rehab team continue to assess barriers to discharge/monitor patient progress toward functional and medical goals. FIM: FIM - Bathing Bathing Steps Patient Completed: Chest;Right Arm;Left Arm;Abdomen;Front perineal area;Right upper leg;Left upper leg;Right lower leg (including foot);Left lower leg (including foot) Bathing: 5: Set-up assist to: Obtain items  FIM - Upper Body Dressing/Undressing Upper body dressing/undressing steps patient completed: Thread/unthread right sleeve of pullover shirt/dresss;Thread/unthread left sleeve of pullover shirt/dress;Put head through opening of pull over shirt/dress;Pull shirt over trunk Upper body dressing/undressing: 5: Set-up assist to: Obtain clothing/put away FIM - Lower Body Dressing/Undressing Lower body dressing/undressing: 0: Activity did not occur        FIM - Banker Devices: Walker;Bed rails Bed/Chair Transfer: 3: Supine > Sit: Mod A (lifting assist/Pt. 50-74%/lift 2 legs  FIM - Locomotion: Wheelchair Locomotion: Wheelchair: 0: Activity did not occur FIM - Locomotion: Ambulation Locomotion: Ambulation Assistive Devices: Walker - Rolling Locomotion: Ambulation: 1: Travels less than 50 ft with minimal assistance (Pt.>75%)  Comprehension Comprehension Mode: Auditory Comprehension: 5-Understands complex 90% of the time/Cues < 10% of the time  Expression Expression Mode: Verbal Expression: 5-Expresses complex 90%  of the time/cues < 10% of the time  Social Interaction Social Interaction: 6-Interacts appropriately  with others with medication or extra time (anti-anxiety, antidepressant).  Problem Solving Problem Solving: 4-Solves basic 75 - 89% of the time/requires cueing 10 - 24% of the time  Memory Memory: 6-More than reasonable amt of time  Medical Problem List and Plan:  1. Deconditioning related to necrotizing fasciitis of the penis, leg ulcers, multi-medical  2. DVT Prophylaxis/Anticoagulation: SCDs.  3. Pain Management: Ultram 100 mg twice a day and hydrocodone as needed. Monitor with increased mobility  4. Neuropsych: This patient is capable of making decisions on his own behalf.  5. Atrial fibrillation. Coumadin discontinued 02/15/2013 with question of Coumadin necrosis lower extremities. Cardiac rate controlled continue amiodarone. Followup cardiology services   -check EKG now.  -may need a fluid bolus 6. Necrotizing fasciitis of the penile glans and foreskin. Status post circumcision and debridement 02/04/2013.   -reviewed wound care with patient and RN 7. Chronic lower extremity wounds. Suspect calciphylaxis versus Coumadin necrosis. Wound care as advised  -punch biopsy monday 8. Chronic hypotension. midodrine 10 mg 3 times a day. Monitor with increased activity  9. Diabetes mellitus with peripheral neuropathy. Check blood sugars a.c. and at bedtime. Lantus insulin 28 units each bedtime. 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 End-stage renal disease. HD per [facility].  12. Chronic anemia. Aranesp weekly  13. Anxiety/restlessness. Ativan 0.5 mg daily  14. Loose stool---improved. c diff neg. Imodium prn LOS (Days) 4 A FACE TO FACE EVALUATION WAS PERFORMED  Jakyiah Briones T 02/20/2013 8:13 AM

## 2013-02-20 NOTE — Progress Notes (Signed)
[facility] Progress Note  Subjective:  Therapy has been rough. Feeling "woozy" this am.  Afib/RVR on EKG with SBPs chronically in the 80's.    Objective Filed Vitals:   02/19/13 1947 02/19/13 2020 02/20/13 0642 02/20/13 0810  BP: 99/61 92/60 82/56  88/60  Pulse: 92 94 111 120  Temp: 97.4 F (36.3 C) 96.9 F (36.1 C) 97.9 F (36.6 C)   TempSrc: Axillary Oral Oral   Resp: 16 17 19 18   Weight:  141 kg (310 lb 13.6 oz) 141.8 kg (312 lb 9.8 oz)   SpO2: 93% 94% 98% 86%   Physical Exam General: Alert, talkative, NAD Heart: Tachy irreg Lungs: Grossly clear Abdomen: Obese, NT, +BS Extremities: LE's dark/woody/tight. Ulcerations to bilat thighs/calves wrapped Dialysis Access: R AVF + bruit  D[facility]: [facility]  4.75h F180 Bath 2K/2.25Ca DW 143.5kg RFA AVF No heparin  EPO 22K Hect 6ug No Fe at center  Recent labs: Hgb 8.9 12/29, 8.6 11/25 - on 22,000 Epo over a month; tsat 13% 12/18 got venofer 12/29 - ferritin 62 in October; iPTH 157 12/18 improved with normal Ca and P    Assessment/Plan: 1. 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 Deep tissue biopsy could help determine between the 2 as the treatment for these are quite different, however there have been cases caused by the interaction of coumadin and mineral metabolism disorders in pts with ESRD. Plan if for surgery to obtain deep tissue punch biopsy to help guide management on Monday.  2. For now we are treating both with aggressive HD, sodium thiosulfate, non-calcium based binders, no vit D, sensipar, and no coumadin. Follow labs. 3. prognosis guarded,  but wounds stable; pain controlled.  2. ESRD- plan for HD ThFSa, then 4 days next week  1. Short session on Monday and back to regular TTS schedule 2. K+ is being supplemented BID. Check labs 3. Chronic hypotension/volume overload - with ++LE edema, cardiomyopathy w low BP's on midodrine TID. Symptomatic AFib/RVR this a.m.  Fluid bolus of ordered per primary.  Would not give further IVF.  Continue with plan for short session in the a.m as long as Afib is rate controlled. Resume TTS with goal 2-2.5 kg per Rx maintaining SBP >80; Net UF 2L on Sat. Now under edw. 4. Anemia/CKD - Hgb 9.2 on Aranesp 200 mcg qThurs. TSat 17%, Ferritin 345. Hold off on IV Fe pending deep tissue biopsy results. Repeat CBC 5. NICM / bivent HF / LVEF 25% / AICD/  repeat Echo 20 - 15% 02/13/13 6. Pacemaker 7. Morbid obesity 8. Nutrition - heart healthy diet + prostat and resource supplements - alb low; KCL suppl 9. Dispo- s/p transfer to [facility]. Thad Ranger [facility] Pager [phone] 02/20/2013,10:06 AM  LOS: 4 days    Additional Objective Labs: Basic Metabolic Panel:  Recent Labs Lab 02/15/13 0420 02/16/13 0612 02/17/13 1530 02/18/13 0554  NA 138 137 133* 140  K 3.8 5.1 5.9* 4.3  CL 92* 92* 88* 96  CO2 23 20 13* 23  GLUCOSE 88 146* 122* 114*  BUN 17 20 34* 21  CREATININE 3.08*  3.02* 4.61* 3.54*  CALCIUM 8.8 9.2 9.4 8.7  PHOS 4.2 3.7 5.3*  --    Liver Function Tests:  Recent Labs Lab 02/15/13 0420 02/16/13 0612 02/17/13 1530  ALBUMIN 1.8* 1.8* 2.1*   CBC:  Recent Labs Lab 02/14/13 0419 02/15/13 0420 02/16/13 0612  WBC 13.3* 12.5* 11.3*  NEUTROABS 10.8*  --   --   HGB 9.1* 8.6* 9.2*  HCT 28.3* 26.6* 28.9*  MCV 88.7 88.7 91.5  PLT 357 355 351   Blood Culture    Component Value Date/Time   SDES URINE, CATHETERIZED 02/06/2013 0611   SPECREQUEST NONE 02/06/2013 0611   CULT  Value: Multiple bacterial morphotypes present, none predominant. Suggest  appropriate recollection if clinically indicated. [facility] 02/06/2013 0611   REPTSTATUS 02/08/2013 FINAL 02/06/2013 0611    CBG:  Recent Labs Lab 02/18/13 2006 02/19/13 0715 02/19/13 1218 02/19/13 2010 02/20/13 0714  GLUCAP 77 74 142* 122* 121*   Iron Studies:  Recent Labs  02/18/13 1425  IRON 35*  TIBC 204*  FERRITIN 345*   Studies/Results: No results found. Medications:   . amiodarone  200 mg Oral BID  . colchicine  0.6 mg Oral Q3 days  . darbepoetin (ARANESP) injection - DIALYSIS  200 mcg Intravenous Q Thu-HD  . diclofenac sodium  2 g Topical QID  . febuxostat  80 mg Oral Daily  . feeding supplement (PRO-STAT SUGAR FREE 64)  30 mL Oral BID WC  . feeding supplement (RESOURCE BREEZE)  1 Container Oral BID BM  . insulin aspart  0-9 Units Subcutaneous TID WC  . insulin glargine  28 Units Subcutaneous QHS  . LORazepam  0.5 mg Oral Daily  . midodrine  10 mg Oral TID WC  . multivitamin  1 tablet Oral QHS  . pantoprazole  40 mg Oral QAC breakfast  . potassium chloride  20 mEq Oral BID  . saccharomyces boulardii  250 mg Oral BID  . sevelamer carbonate  1,600 mg Oral TID WC  . sodium chloride  250 mL Intravenous Once  . traMADol  100 mg Oral BID   I have seen and examined this patient and agree with plan as outlined above however pt developed A fib with RVR as we are holding his beta-blockers due to hypotension.  Will need to reconsult cardiology to assist with rate control without hypotension. [name],MD 02/20/2013 11:40 AM

## 2013-02-21 ENCOUNTER — Inpatient Hospital Stay (HOSPITAL_COMMUNITY): Payer: Medicaid Other

## 2013-02-21 ENCOUNTER — Encounter (HOSPITAL_COMMUNITY): Payer: Medicaid Other

## 2013-02-21 ENCOUNTER — Ambulatory Visit: Payer: Medicaid Other | Admitting: Internal Medicine

## 2013-02-21 ENCOUNTER — Inpatient Hospital Stay (HOSPITAL_COMMUNITY)
Admission: AD | Admit: 2013-02-21 | Discharge: 2013-02-27 | DRG: 640 | Disposition: E | Payer: Medicaid Other | Source: Intra-hospital | Attending: Pulmonary Disease | Admitting: Pulmonary Disease

## 2013-02-21 DIAGNOSIS — M726 Necrotizing fasciitis: Secondary | ICD-10-CM | POA: Diagnosis present

## 2013-02-21 DIAGNOSIS — E872 Acidosis, unspecified: Secondary | ICD-10-CM | POA: Diagnosis present

## 2013-02-21 DIAGNOSIS — I428 Other cardiomyopathies: Secondary | ICD-10-CM | POA: Diagnosis present

## 2013-02-21 DIAGNOSIS — E785 Hyperlipidemia, unspecified: Secondary | ICD-10-CM | POA: Diagnosis present

## 2013-02-21 DIAGNOSIS — I4891 Unspecified atrial fibrillation: Secondary | ICD-10-CM

## 2013-02-21 DIAGNOSIS — I9589 Other hypotension: Secondary | ICD-10-CM | POA: Diagnosis present

## 2013-02-21 DIAGNOSIS — I472 Ventricular tachycardia, unspecified: Secondary | ICD-10-CM | POA: Diagnosis present

## 2013-02-21 DIAGNOSIS — I120 Hypertensive chronic kidney disease with stage 5 chronic kidney disease or end stage renal disease: Secondary | ICD-10-CM | POA: Diagnosis present

## 2013-02-21 DIAGNOSIS — F411 Generalized anxiety disorder: Secondary | ICD-10-CM

## 2013-02-21 DIAGNOSIS — I5042 Chronic combined systolic (congestive) and diastolic (congestive) heart failure: Secondary | ICD-10-CM | POA: Diagnosis present

## 2013-02-21 DIAGNOSIS — R791 Abnormal coagulation profile: Secondary | ICD-10-CM | POA: Diagnosis present

## 2013-02-21 DIAGNOSIS — E119 Type 2 diabetes mellitus without complications: Secondary | ICD-10-CM | POA: Diagnosis present

## 2013-02-21 DIAGNOSIS — I959 Hypotension, unspecified: Secondary | ICD-10-CM

## 2013-02-21 DIAGNOSIS — T45515A Adverse effect of anticoagulants, initial encounter: Secondary | ICD-10-CM | POA: Diagnosis present

## 2013-02-21 DIAGNOSIS — G4733 Obstructive sleep apnea (adult) (pediatric): Secondary | ICD-10-CM | POA: Diagnosis present

## 2013-02-21 DIAGNOSIS — Z95810 Presence of automatic (implantable) cardiac defibrillator: Secondary | ICD-10-CM

## 2013-02-21 DIAGNOSIS — I4729 Other ventricular tachycardia: Secondary | ICD-10-CM | POA: Diagnosis present

## 2013-02-21 DIAGNOSIS — R570 Cardiogenic shock: Secondary | ICD-10-CM

## 2013-02-21 DIAGNOSIS — F17200 Nicotine dependence, unspecified, uncomplicated: Secondary | ICD-10-CM | POA: Diagnosis present

## 2013-02-21 DIAGNOSIS — E662 Morbid (severe) obesity with alveolar hypoventilation: Secondary | ICD-10-CM | POA: Diagnosis present

## 2013-02-21 DIAGNOSIS — J9600 Acute respiratory failure, unspecified whether with hypoxia or hypercapnia: Secondary | ICD-10-CM

## 2013-02-21 DIAGNOSIS — R5381 Other malaise: Secondary | ICD-10-CM

## 2013-02-21 DIAGNOSIS — I509 Heart failure, unspecified: Secondary | ICD-10-CM | POA: Diagnosis present

## 2013-02-21 DIAGNOSIS — E875 Hyperkalemia: Principal | ICD-10-CM | POA: Diagnosis present

## 2013-02-21 DIAGNOSIS — I469 Cardiac arrest, cause unspecified: Secondary | ICD-10-CM

## 2013-02-21 DIAGNOSIS — N186 End stage renal disease: Secondary | ICD-10-CM

## 2013-02-21 LAB — COMPREHENSIVE METABOLIC PANEL
ALT: 12 U/L (ref 0–53)
AST: 50 U/L — AB (ref 0–37)
Albumin: 1.9 g/dL — ABNORMAL LOW (ref 3.5–5.2)
Alkaline Phosphatase: 145 U/L — ABNORMAL HIGH (ref 39–117)
BUN: 24 mg/dL — AB (ref 6–23)
CO2: 17 mEq/L — ABNORMAL LOW (ref 19–32)
CREATININE: 4.52 mg/dL — AB (ref 0.50–1.35)
Calcium: 9.9 mg/dL (ref 8.4–10.5)
Chloride: 90 mEq/L — ABNORMAL LOW (ref 96–112)
GFR calc Af Amer: 16 mL/min — ABNORMAL LOW (ref >90)
GFR calc non Af Amer: 14 mL/min — ABNORMAL LOW (ref >90)
Glucose, Bld: 179 mg/dL — ABNORMAL HIGH (ref 70–99)
Potassium: 5.5 mEq/L — ABNORMAL HIGH (ref 3.7–5.3)
Sodium: 135 mEq/L — ABNORMAL LOW (ref 137–147)
TOTAL PROTEIN: 8.1 g/dL (ref 6.0–8.3)
Total Bilirubin: 3.6 mg/dL — ABNORMAL HIGH (ref 0.3–1.2)

## 2013-02-21 LAB — CBC
HCT: 30.1 % — ABNORMAL LOW (ref 39.0–52.0)
HEMATOCRIT: 29.7 % — AB (ref 39.0–52.0)
HEMOGLOBIN: 8.9 g/dL — AB (ref 13.0–17.0)
Hemoglobin: 9 g/dL — ABNORMAL LOW (ref 13.0–17.0)
MCH: 28.7 pg (ref 26.0–34.0)
MCH: 29.5 pg (ref 26.0–34.0)
MCHC: 29.9 g/dL — ABNORMAL LOW (ref 30.0–36.0)
MCHC: 30 g/dL (ref 30.0–36.0)
MCV: 95.9 fL (ref 78.0–100.0)
MCV: 98.3 fL (ref 78.0–100.0)
Platelets: 306 10*3/uL (ref 150–400)
Platelets: 304 10*3/uL (ref 150–400)
RBC: 3.14 MIL/uL — AB (ref 4.22–5.81)
RBC: 3.02 MIL/uL — AB (ref 4.22–5.81)
RDW: 20.8 % — ABNORMAL HIGH (ref 11.5–15.5)
RDW: 21.1 % — ABNORMAL HIGH (ref 11.5–15.5)
WBC: 8.8 10*3/uL (ref 4.0–10.5)
WBC: 13.6 10*3/uL — ABNORMAL HIGH (ref 4.0–10.5)

## 2013-02-21 LAB — POCT I-STAT 3, ART BLOOD GAS (G3+)
Acid-base deficit: 2 mmol/L (ref 0.0–2.0)
Acid-base deficit: 8 mmol/L — ABNORMAL HIGH (ref 0.0–2.0)
Acid-base deficit: 9 mmol/L — ABNORMAL HIGH (ref 0.0–2.0)
Acid-base deficit: 6 mmol/L — ABNORMAL HIGH (ref 0.0–2.0)
BICARBONATE: 17.6 meq/L — AB (ref 20.0–24.0)
BICARBONATE: 18.5 meq/L — AB (ref 20.0–24.0)
Bicarbonate: 22.4 mEq/L (ref 20.0–24.0)
Bicarbonate: 24.5 mEq/L — ABNORMAL HIGH (ref 20.0–24.0)
O2 Saturation: 7 %
O2 Saturation: 91 %
O2 Saturation: 99 %
O2 Saturation: 69 %
PCO2 ART: 41.5 mmHg (ref 35.0–45.0)
PCO2 ART: 51.5 mmHg — AB (ref 35.0–45.0)
PCO2 ART: 69.4 mmHg — AB (ref 35.0–45.0)
PH ART: 7.235 — AB (ref 7.350–7.450)
PH ART: 7.366 (ref 7.350–7.450)
PO2 ART: 71 mmHg — AB (ref 80.0–100.0)
TCO2: 19 mmol/L (ref 0–100)
TCO2: 24 mmol/L (ref 0–100)
TCO2: 26 mmol/L (ref 0–100)
TCO2: 19 mmol/L (ref 0–100)
pCO2 arterial: 32 mmHg — ABNORMAL LOW (ref 35.0–45.0)
pH, Arterial: 7.116 — CL (ref 7.350–7.450)
pH, Arterial: 7.284 — ABNORMAL LOW (ref 7.350–7.450)
pO2, Arterial: 11 mmHg — CL (ref 80.0–100.0)
pO2, Arterial: 163 mmHg — ABNORMAL HIGH (ref 80.0–100.0)
pO2, Arterial: 35 mmHg — CL (ref 80.0–100.0)

## 2013-02-21 LAB — PROTIME-INR
INR: 2.94 — ABNORMAL HIGH (ref 0.00–1.49)
INR: 2.91 — AB (ref 0.00–1.49)
Prothrombin Time: 29.6 seconds — ABNORMAL HIGH (ref 11.6–15.2)
Prothrombin Time: 29.4 seconds — ABNORMAL HIGH (ref 11.6–15.2)

## 2013-02-21 LAB — RENAL FUNCTION PANEL
Albumin: 2 g/dL — ABNORMAL LOW (ref 3.5–5.2)
BUN: 23 mg/dL (ref 6–23)
CHLORIDE: 91 meq/L — AB (ref 96–112)
CO2: 23 mEq/L (ref 19–32)
Calcium: 9 mg/dL (ref 8.4–10.5)
Creatinine, Ser: 4.17 mg/dL — ABNORMAL HIGH (ref 0.50–1.35)
GFR calc Af Amer: 18 mL/min — ABNORMAL LOW (ref >90)
GFR calc non Af Amer: 16 mL/min — ABNORMAL LOW (ref >90)
GLUCOSE: 147 mg/dL — AB (ref 70–99)
POTASSIUM: 5 meq/L (ref 3.7–5.3)
Phosphorus: 4.1 mg/dL (ref 2.3–4.6)
Sodium: 134 mEq/L — ABNORMAL LOW (ref 137–147)

## 2013-02-21 LAB — POCT I-STAT, CHEM 8
BUN: 19 mg/dL (ref 6–23)
BUN: 23 mg/dL (ref 6–23)
Calcium, Ion: 1.18 mmol/L (ref 1.12–1.23)
Calcium, Ion: 1.31 mmol/L — ABNORMAL HIGH (ref 1.12–1.23)
Chloride: 110 mEq/L (ref 96–112)
Chloride: 97 mEq/L (ref 96–112)
Creatinine, Ser: 3.3 mg/dL — ABNORMAL HIGH (ref 0.50–1.35)
Creatinine, Ser: 4.3 mg/dL — ABNORMAL HIGH (ref 0.50–1.35)
Glucose, Bld: 109 mg/dL — ABNORMAL HIGH (ref 70–99)
Glucose, Bld: 191 mg/dL — ABNORMAL HIGH (ref 70–99)
HCT: 28 % — ABNORMAL LOW (ref 39.0–52.0)
HEMATOCRIT: 16 % — AB (ref 39.0–52.0)
HEMOGLOBIN: 5.4 g/dL — AB (ref 13.0–17.0)
HEMOGLOBIN: 9.5 g/dL — AB (ref 13.0–17.0)
Potassium: 2.9 mEq/L — CL (ref 3.7–5.3)
Potassium: 3.8 mEq/L (ref 3.7–5.3)
SODIUM: 146 meq/L (ref 137–147)
SODIUM: 140 meq/L (ref 137–147)
TCO2: 12 mmol/L (ref 0–100)
TCO2: 21 mmol/L (ref 0–100)

## 2013-02-21 LAB — HEPATIC FUNCTION PANEL
ALBUMIN: 2 g/dL — AB (ref 3.5–5.2)
ALT: 12 U/L (ref 0–53)
AST: 41 U/L — AB (ref 0–37)
Alkaline Phosphatase: 151 U/L — ABNORMAL HIGH (ref 39–117)
Bilirubin, Direct: 2.8 mg/dL — ABNORMAL HIGH (ref 0.0–0.3)
Indirect Bilirubin: 1.2 mg/dL — ABNORMAL HIGH (ref 0.3–0.9)
Total Bilirubin: 4 mg/dL — ABNORMAL HIGH (ref 0.3–1.2)
Total Protein: 8.8 g/dL — ABNORMAL HIGH (ref 6.0–8.3)

## 2013-02-21 LAB — GLUCOSE, CAPILLARY
GLUCOSE-CAPILLARY: 128 mg/dL — AB (ref 70–99)
GLUCOSE-CAPILLARY: 94 mg/dL (ref 70–99)
Glucose-Capillary: 144 mg/dL — ABNORMAL HIGH (ref 70–99)

## 2013-02-21 LAB — MAGNESIUM: MAGNESIUM: 2.2 mg/dL (ref 1.5–2.5)

## 2013-02-21 LAB — MRSA PCR SCREENING: MRSA by PCR: POSITIVE — AB

## 2013-02-21 LAB — PHOSPHORUS: Phosphorus: 5.5 mg/dL — ABNORMAL HIGH (ref 2.3–4.6)

## 2013-02-21 LAB — PRO B NATRIURETIC PEPTIDE: PRO B NATRI PEPTIDE: 25454 pg/mL — AB (ref 0–125)

## 2013-02-21 LAB — LACTIC ACID, PLASMA: LACTIC ACID, VENOUS: 12.2 mmol/L — AB (ref 0.5–2.2)

## 2013-02-21 LAB — AMMONIA: Ammonia: 101 umol/L — ABNORMAL HIGH (ref 11–60)

## 2013-02-21 MED ORDER — LIDOCAINE HCL (PF) 1 % IJ SOLN: 5.0000 mL | INTRAMUSCULAR | Status: DC | PRN

## 2013-02-21 MED ORDER — SODIUM CHLORIDE 0.9 % IV SOLN: 100.0000 mL | INTRAVENOUS | Status: DC | PRN

## 2013-02-21 MED ORDER — ALTEPLASE 2 MG IJ SOLR: 2.0000 mg | Freq: Once | INTRAMUSCULAR | Status: DC | PRN

## 2013-02-21 MED ORDER — PRISMASOL BGK 4/2.5 32-4-2.5 MEQ/L IV SOLN
INTRAVENOUS | Status: DC
  Filled 2013-02-21 (×2): qty 5000

## 2013-02-21 MED ORDER — SODIUM CHLORIDE 0.9 % FOR CRRT
INTRAVENOUS_CENTRAL | Status: DC | PRN
  Filled 2013-02-21: qty 1000

## 2013-02-21 MED ORDER — ALBUTEROL SULFATE HFA 108 (90 BASE) MCG/ACT IN AERS
1.0000 | INHALATION_SPRAY | RESPIRATORY_TRACT | Status: DC | PRN
  Filled 2013-02-21: qty 6.7

## 2013-02-21 MED ORDER — PRISMASOL BGK 4/2.5 32-4-2.5 MEQ/L IV SOLN
INTRAVENOUS | Status: DC
  Filled 2013-02-21: qty 5000

## 2013-02-21 MED ORDER — PRISMASOL BGK 0/2.5 32-2.5 MEQ/L IV SOLN
INTRAVENOUS | Status: DC
  Filled 2013-02-21 (×8): qty 5000

## 2013-02-21 MED ORDER — FENTANYL CITRATE 0.05 MG/ML IJ SOLN: 50.0000 ug | Freq: Once | INTRAMUSCULAR | Status: AC

## 2013-02-21 MED ORDER — HEPARIN SODIUM (PORCINE) 1000 UNIT/ML DIALYSIS
1000.0000 [IU] | INTRAMUSCULAR | Status: DC | PRN
Start: 2013-02-21 — End: 2013-02-21

## 2013-02-21 MED ORDER — HEPARIN SODIUM (PORCINE) 1000 UNIT/ML DIALYSIS: 1000.0000 [IU] | INTRAMUSCULAR | Status: DC | PRN

## 2013-02-21 MED ORDER — PANTOPRAZOLE SODIUM 40 MG IV SOLR: 40.0000 mg | Freq: Every day | INTRAVENOUS | Status: DC

## 2013-02-21 MED ORDER — FENTANYL CITRATE 0.05 MG/ML IJ SOLN
25.0000 ug | INTRAMUSCULAR | Status: DC | PRN
Start: 2013-02-21 — End: 2013-02-21

## 2013-02-21 MED ORDER — INSULIN ASPART 100 UNIT/ML ~~LOC~~ SOLN: 0.0000 [IU] | SUBCUTANEOUS | Status: DC

## 2013-02-21 MED ORDER — DEXTROSE 5 % IV SOLN
INTRAVENOUS | Status: DC
  Administered 2013-02-21: 13:00:00 via INTRAVENOUS
  Filled 2013-02-21 (×4): qty 150

## 2013-02-21 MED ORDER — FENTANYL CITRATE 0.05 MG/ML IJ SOLN
INTRAMUSCULAR | Status: AC
  Administered 2013-02-21: 100 ug
  Filled 2013-02-21: qty 2

## 2013-02-21 MED ORDER — SODIUM CHLORIDE 0.9 % IV SOLN
0.0000 ug/h | INTRAVENOUS | Status: DC
  Administered 2013-02-21: 50 ug/h via INTRAVENOUS
  Filled 2013-02-21: qty 50

## 2013-02-21 MED ORDER — PHENYLEPHRINE HCL 10 MG/ML IJ SOLN
30.0000 ug/min | INTRAMUSCULAR | Status: DC
  Administered 2013-02-21: 30 ug/min via INTRAVENOUS
  Filled 2013-02-21: qty 1

## 2013-02-21 MED ORDER — FENTANYL BOLUS VIA INFUSION
50.0000 ug | INTRAVENOUS | Status: DC | PRN
  Filled 2013-02-21: qty 100

## 2013-02-21 MED ORDER — LIDOCAINE-PRILOCAINE 2.5-2.5 % EX CREA: 1.0000 "application " | TOPICAL_CREAM | CUTANEOUS | Status: DC | PRN

## 2013-02-21 MED ORDER — VANCOMYCIN HCL 10 G IV SOLR
2500.0000 mg | Freq: Once | INTRAVENOUS | Status: AC
  Administered 2013-02-21: 2500 mg via INTRAVENOUS
  Filled 2013-02-21: qty 2500

## 2013-02-21 MED ORDER — MIDAZOLAM HCL 2 MG/2ML IJ SOLN
INTRAMUSCULAR | Status: AC
  Filled 2013-02-21: qty 2

## 2013-02-21 MED ORDER — SODIUM CHLORIDE 0.9 % IV SOLN: 250.0000 mL | INTRAVENOUS | Status: DC | PRN

## 2013-02-21 MED ORDER — PENTAFLUOROPROP-TETRAFLUOROETH EX AERO: 1.0000 "application " | INHALATION_SPRAY | CUTANEOUS | Status: DC | PRN

## 2013-02-21 MED ORDER — PANTOPRAZOLE SODIUM 40 MG PO TBEC: 40.0000 mg | DELAYED_RELEASE_TABLET | Freq: Every day | ORAL | Status: DC

## 2013-02-21 MED ORDER — FENTANYL CITRATE 0.05 MG/ML IJ SOLN
INTRAMUSCULAR | Status: AC
  Filled 2013-02-21: qty 2

## 2013-02-21 MED ORDER — PIPERACILLIN-TAZOBACTAM IN DEX 2-0.25 GM/50ML IV SOLN
2.2500 g | Freq: Three times a day (TID) | INTRAVENOUS | Status: DC
  Administered 2013-02-21: 2.25 g via INTRAVENOUS
  Filled 2013-02-21 (×3): qty 50

## 2013-02-21 MED ORDER — NEPRO/CARBSTEADY PO LIQD
237.0000 mL | ORAL | Status: DC | PRN
  Filled 2013-02-21: qty 237

## 2013-02-21 MED FILL — Medication: Qty: 1 | Status: AC

## 2013-02-22 MED FILL — Medication: Qty: 1 | Status: AC

## 2013-02-22 NOTE — Discharge Summary (Signed)
Adam [name], CORP                  ACCOUNT NO.:  [id number]  MEDICAL RECORD NO.:  [id number]  LOCATION:  [id number]                        FACILITY:  [facility]  PHYSICIAN:  Ranelle Oyster, M.D.DATE OF BIRTH:  Jul 07, 1965  DATE OF ADMISSION:  02/16/2013 DATE OF DISCHARGE:  02/20/2013                              DISCHARGE SUMMARY   DISCHARGE DIAGNOSES: 1. Deconditioning related to necrotizing fasciitis of the penis, legs     with multi-medical issues. 2. Sequential compression devices for DVT prophylaxis. 3. Cardiac arrest. 4. Atrial fibrillation. 5. Chronic lower extremity wounds. 6. Chronic hypotension. 7. Diabetes mellitus with peripheral neuropathy. 8. MRSA nasal nares, contact precautions. 9. End-stage renal disease with hemodialysis. 10.Chronic anemia. 11.Anxiety.  HISTORY OF PRESENT ILLNESS:  This is a 48 year old right-handed male with end-stage renal disease, on hemodialysis as well as congestive heart failure with ejection fraction of 25%, atrial fibrillation with chronic Coumadin therapy with chronic lower extremity wounds.  Admitted on February 03, 2013, with left leg pain and penile discharge after small laceration required sutures.  Findings of elevated WBC of low grade fever 100.3 with orthostatic blood pressure.  Urology Service was consulted, suspect necrotizing fasciitis of the penile glans and foreskin.  Positive MRSA nasal swab, maintained on contact precautions. Underwent circumcision and debridement of the penile shaft on February 04, 2013, per Urology Services.  Wound care nurse for chronic lower extremity wounds.  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  No current plan for deep tissue biopsy at this time.  Echocardiogram with ejection fraction of 25%.  No change from previous tracings.  Chronic hypotension, maintained on midodrine.   The patient was admitted for comprehensive rehab program.  PAST MEDICAL HISTORY:  See discharge diagnoses.  SOCIAL HISTORY:  Lives with girlfriend.  FUNCTIONAL HISTORY:  Prior to admission, used a cane.  FUNCTIONAL STATUS:  Upon admission to rehab services, moderate to max assist to ambulate 10 feet, total assist to sit at edge of bed.  PHYSICAL EXAMINATION:  VITAL SIGNS:  Blood pressure 98/70, pulse 110, temperature 97.5, respirations 20. GENERAL:  This was an alert male, no acute distress. EYES:  Pupils round and reactive to light. LUNGS:  Decreased breath sounds. CARDIAC:  Rate controlled. ABDOMEN:  Soft, nontender.  Good bowel sounds.  Thigh wound dressed with gauze and petroleum gauze.  Penile shaft clean and dry, but tender. Chronic stasis changes in the legs. NEUROLOGIC:  Mood was flat, but engaging.  REHABILITATION HOSPITAL COURSE:  The patient was admitted to inpatient rehab services with therapies initiated on a 3-hour daily basis consisting of physical therapy, occupational therapy, and rehabilitation nursing.  The following issues were addressed during the patient's rehabilitation stay.  Pertaining to Mr. Petteys deconditioning related to necrotizing fasciitis of the penis, leg ulcers, multi-medical, continued wound care as advised with followup per Urology Services.  Renal Service ongoing for hemodialysis, which was quite challenging with his hypotension, maintained on midodrine.  Contacts made with Cardiology Services in relation to his orthostasis, bouts of tachycardia.  He did have an ICD in place.  He  had been placed on amiodarone and adjusted accordingly.  His progress remained limited during his rehab stay due to multiple medical issues.  Hypotension was a limiting factor.  Note that at 10:47 a.m. on February 21, 2013, code blue was called due to hypotension.  The patient was pulseless, received CPR x10 minutes and revived.  He was transferred to the MICU Unit  in very guarded condition. All medication changes were made at Critical Care Medicine's discretion. Family was contacted of the patient's guarded condition.     Mariam Dollaraniel Isidora Laham, P.A.   ______________________________ Ranelle OysterZachary T. Swartz, M.D.    DA/MEDQ  D:  02/09/2013  T:  12/20/13  Job:  [id number]  cc:   Lynelle SmokeSigmund I. Patsi Searsannenbaum, M.D. Luis AbedJeffrey D. Katz, MD, [facility]naFACC Abigail Miyamotoouglas Blackman, M.D.

## 2013-02-27 LAB — CULTURE, BLOOD (ROUTINE X 2)
CULTURE: NO GROWTH
CULTURE: NO GROWTH

## 2013-02-27 NOTE — Progress Notes (Signed)
  Subjective: Patient reports dressings changed per nursingstaff over weekend. No pain. No urine made. pvr 35cc by pvr.  However, pt having hypotention, and ? DVT. He may need xfer to inpatient side.   Objective: Vital signs in last 24 hours: Temp:  [96.9 F (36.1 C)-98.8 F (37.1 C)] 96.9 F (36.1 C) (01/26 0418) Pulse Rate:  [107-130] 107 (01/26 0418) Resp:  [17-18] 18 (01/26 0418) BP: (77-96)/(54-62) 85/58 mmHg (01/26 0418) SpO2:  [86 %-98 %] 98 % (01/26 0418) Weight:  [143.3 kg (315 lb 14.7 oz)] 143.3 kg (315 lb 14.7 oz) (01/26 0418)  Intake/Output from previous day: 01/25 0701 - 01/26 0700 In: 1560 [P.O.:1560] Out: -  Intake/Output this shift:    Physical Exam:  General:alert and cooperative GI: not done Male genitalia: not done Penis: circumcised and wound healing slowly. glans softening with dressing changes.Circumcision site healing slowly   Lab Results:  Recent Labs  02/24/2013 0550  HGB 9.0*  HCT 30.1*   BMET  Recent Labs  02/01/2013 0550  NA 134*  K 5.0  CL 91*  CO2 23  GLUCOSE 147*  BUN 23  CREATININE 4.17*  CALCIUM 9.0    Recent Labs  02/19/13 0523 02/20/13 0653 02/14/2013 0550  INR 2.85* 2.77* 2.94*   No results found for this basename: LABURIN,  in the last 72 hours Results for orders placed during the hospital encounter of 02/16/13  CLOSTRIDIUM DIFFICILE BY PCR     Status: None   Collection Time    02/18/13  9:36 AM      Result Value Range Status   C difficile by pcr NEGATIVE  NEGATIVE Final    Studies/Results: No results found.  Assessment/Plan: Continue dressing changes. Pt's wound is responding. It now looks like he will retain his glans. Urology no longer needed. Please re-consult as needed. Anticipate he will need wet-to dry dressings until wound is healed. Could have woujd care team asseament if he goes back to the in-patient side.    LOS: 5 days   Yomayra Tate I 02/15/2013, 8:07 AM

## 2013-02-27 NOTE — Procedures (Signed)
CPR Procedure Note  PEA original rhythm, ACLS protocol followed, hyperkalemia was a concern.  Treated with insulin, glucose, bicarb and calcium.  ROSC but fluctuant BP.  Please see consult note for plan.  Alyson Reedy, M.D. [facility] Pulmonary/Critical Care Medicine. Pager: [phone]. After hours pager: [phone].

## 2013-02-27 NOTE — Progress Notes (Signed)
At 2000 BP=77/60, HR=121 O2 sat-91% with O2 at 2.5 L/M, temp 97.4. Patient lethargic, easily aroused to answer questions. Paged rapid response RN to assess patient. Spoke with Dr.Swartz and deferred to cardiology. Spoke with Dr. Isidoro Donning and instructed to give 2 cups of water and obtain CBC in AM. No further N & V at this time. Intermittently cool and clammy and or sweaty. Requested and ate small HS snack. Ultram held, patient without complaint of pain. BLE discolored and with edema-right > left.Alfredo Martinez A

## 2013-02-27 NOTE — Progress Notes (Signed)
INR today still 2.94.  Please contact us when INR is < 1.8 at which time we can proceed with biopsy.  Until then we will follow the patient peripherally through chart checks.  Marta Lamas. Gae Bon, MD, FACS [phone] [facility]

## 2013-02-27 NOTE — Progress Notes (Signed)
Physical Therapy Session Note  Patient Details  Name: Adam FILIPEK MRN: [id number] Date of Birth: Jan 24, 1966  Today's Date: 02/11/2013 Time: Treatment Session 1: 6803-2122 Time Calculation (min): Treatment Session 1: 45 min  Short Term Goals: Week 1:  PT Short Term Goal 1 (Week 1): Pt to perform bed mobility w/ min A PT Short Term Goal 2 (Week 1): Pt to perform transfers w/ RW and (S) PT Short Term Goal 3 (Week 1): Pt to propel w/c 75' w/ min A PT Short Term Goal 4 (Week 1): Pt to amb 15' w/ RW and min guard A PT Short Term Goal 5 (Week 1): Pt to tolerate standing x70min w/ RW and min guard assist  Skilled Therapeutic Interventions/Progress Updates:  Treatment Session 1:  1:1. Pt received semi-reclined in bed. SaO2 86% on 2.5L [place], increased to 3L and SaO2 resolving to >90% when cued for deep breathing. Pt w/ good tolerance to supine and therex for B LE strength including 2x10 reps of: ankle pumps, quad sets, glute sets, LAQ, marching (assisted). Min A for sup>sit and sit<>stand x2 from elevated bed w/ RW x20-30sec (no change in vitals), Mod A for sit>sup. Pt req seated rest throughout 2/2 fatigue. Pt semi-reclined in bed at end of session w/ all needs in reach, bed alarm on. RN present and aware that pt now on 3L. Missed this session 2/2 fatigue.   Pt d/c to acute care on 1/26 due to ongoing medical issues.   Therapy Documentation Precautions:  Precautions Precautions: Fall;Other (comment) Precaution Comments: Contact  Restrictions Weight Bearing Restrictions: No Vital Signs: Therapy Vitals Pulse Rate: 130 BP: 102/68 mmHg Patient Position, if appropriate: Lying Oxygen Therapy SpO2: 92 % O2 Device: Nasal cannula O2 Flow Rate (L/min): 3 L/min Pain: Pain Assessment Pain Assessment: No/denies pain  See FIM for current functional status  Therapy/Group: Individual Therapy  Denzil Hughes 02/07/2013, 9:21 AM

## 2013-02-27 NOTE — Procedures (Signed)
Central Venous Catheter Insertion Procedure Note Adam [name] [id number] [birth year]  Procedure: Insertion of Central Venous Catheter Indications: HD access  Procedure Details Consent: Risks of procedure as well as the alternatives and risks of each were explained to the (patient/caregiver).  Consent for procedure obtained. Time Out: Verified patient identification, verified procedure, site/side was marked, verified correct patient position, special equipment/implants available, medications/allergies/relevent history reviewed, required imaging and test results available.  Performed  Maximum sterile technique was used including antiseptics, cap, gloves, gown, hand hygiene, mask and sheet. Skin prep: Chlorhexidine; local anesthetic administered A antimicrobial bonded/coated triple lumen catheter was placed in the right internal jugular vein using the Seldinger technique.  Evaluation Blood flow good Complications: No apparent complications Patient did tolerate procedure well. Chest X-ray ordered to verify placement.  CXR: pending.  U/S used in placement.  YACOUB,WESAM 03/18/13, 2:19 PM

## 2013-02-27 NOTE — Progress Notes (Signed)
Occupational Therapy Session Note  Patient Details  Name: Adam [name] MRN: [id number] Date of Birth: [birth year]  Today's Date: Mar 09, 2013 Time: 0700-0745 Time Calculation (min): 45 min  Short Term Goals: Week 1:  OT Short Term Goal 1 (Week 1): UB Bath and Dress: Set up sitting EOB or at sink OT Short Term Goal 2 (Week 1): LB Dressing:Mod Assist in sit and stand with AE PRN. OT Short Term Goal 3 (Week 1): Grooming:  Stand at sink to perform 2 grooming tasks with no more than 2 rest breaks OT Short Term Goal 4 (Week 1): Toilet transfer:  Supervision OT Short Term Goal 5 (Week 1): Toileting:  Mod assist  Skilled Therapeutic Interventions/Progress Updates:    Pt resting in bed upon arrival.  Pt removed O2 at before sitting EOB to engage in BADLs at EOB.  O2 sats at 91% immediately removing O2 and HR at 129.  Pt engaged in bathing tasks with sit<>stand from EOB requiring mod A for sit->stand with RW.  Pt required multiple rest breaks throughout session.  O2 sats after 15 mins activity at 83% and 2.5L O2 placed via nasal canula. Pt returned to bed requiring max A for bed mobility.  Focus on activity tolerance, bed mobility, and safety awareness.  Therapy Documentation Precautions:  Precautions Precautions: Fall;Other (comment) Precaution Comments: Contact  Restrictions Weight Bearing Restrictions: No General: General Amount of Missed OT Time (min): 15 Minutes Pain: Pain Assessment Pain Assessment: No/denies pain  See FIM for current functional status  Therapy/Group: Individual Therapy  Rich Brave 2013/03/09, 7:49 AM

## 2013-02-27 NOTE — Procedures (Signed)
Arterial Catheter Insertion Procedure Note Adam [name] [id number] 05-13-1965  Procedure: Insertion of Arterial Catheter  Indications: Blood pressure monitoring  Procedure Details Consent: Unable to obtain consent because of emergent medical necessity. Time Out: Verified patient identification, verified procedure, site/side was marked, verified correct patient position, special equipment/implants available, medications/allergies/relevent history reviewed, required imaging and test results available.  Performed  Maximum sterile technique was used including antiseptics, gloves, gown, hand hygiene and mask. Skin prep: Chlorhexidine; local anesthetic administered 20 gauge catheter was inserted into right femoral artery using the Seldinger technique.  Evaluation Blood flow good; BP tracing good. Complications: No apparent complications.   Emergent code  Adam [name]. Adam Alias, MD, FACP Pgr: [phone] [place]

## 2013-02-27 NOTE — Procedures (Signed)
Central Venous Catheter Insertion Procedure Note Adam [name] [id number] 10-Sep-1965  Procedure: Insertion of Central Venous Catheter Indications: Drug and/or fluid administration  Procedure Details Consent: Unable to obtain consent because of emergent medical necessity. Time Out: Verified patient identification, verified procedure, site/side was marked, verified correct patient position, special equipment/implants available, medications/allergies/relevent history reviewed, required imaging and test results available.  Performed  Maximum sterile technique was used including gloves, gown, hand hygiene, mask and sheet. Skin prep: Chlorhexidine; local anesthetic administered A antimicrobial bonded/coated triple lumen catheter was placed in the left femoral vein due to emergent situation using the Seldinger technique.  Evaluation Blood flow good Complications: No apparent complications Patient did tolerate procedure well. Chest X-ray ordered to verify placement.  CXR: normal.  Adam J. 02/20/2013, 12:53 PM    During code  Adam [name]. Adam Alias, MD, FACP Pgr: [phone] [place] [facility]

## 2013-02-27 NOTE — Progress Notes (Signed)
Chaplain responded to code blue while on 69M. When family arrived, chaplain presented to pt's girlfriend and neighbor in [facility] waiting area. She said she and the pt had been dating 6 months, and that "he is a good, sweet man." She said she is "here for him, remaining strong for him." She said that pt does not "really have other family" involved. She asked the chaplain for prayer. Chaplain prayed with pt, listened empathically to her concerns, and provided emotional and spiritual support and a caring presence.   Please page for follow up or if pt codes again.   Maurene Capes [phone] General: [phone]

## 2013-02-27 NOTE — Progress Notes (Signed)
Late note entry d/t working w/ patient since approx 1050.  At approx 1050, code blue was called.  Upon entering room, pt had spontaneous but agonal respirations.  Started assist ambu bagging on 100% Fio2.  CRNA present at bedside.  Pt returned to spontaneous respirations, and pt woke up-started speaking, pt placed on 100% NRB mask.  Pt was transferred to ICU via RNx3, RTx2, CRNA.  After pt in ICU for several minutes, pt became unresponsive, RN and MD started code, pt bagged on 100% fio2.  Dr. [name] intubated pt w/ 7.5 OET secure at 24 at lip.  + easy cap color change (purple to yellow), + BBSH =.   CXRAY pending OET placement.

## 2013-02-27 NOTE — Progress Notes (Signed)
Attempted ABG, unsuccessful. Another RT will attempt again for ABG.

## 2013-02-27 NOTE — Progress Notes (Addendum)
Pt had cardiac arrest x 3 this am (asystole, PEA), initial istat K+ was 8 even though this am's serum K was 5.0. Pt resuscitated and seemed to respond to meds for high K+. Next lab sent was CMet K of 5.5 , then iSTAT K+ about 30 min later was 2.6 with repeat 3.8.  Pt is too unstable for routine HD and K is fluctuating widely and may go back up in short order.  Recommend CRRT, have d/w CCM Dr Herma Carson.   Vinson Moselle MD (pgr) [phone]    ([phone] 01/28/2013, 1:37 PM

## 2013-02-27 NOTE — Consult Note (Signed)
Name: Adam [name] MRN: [id number] DOB: Oct 30, 1965    ADMISSION DATE:  2013/03/13 CONSULTATION DATE:  03-13-13  REFERRING MD :  Arvilla Market (rehab)  PRIMARY SERVICE: PCCM  CHIEF COMPLAINT:  Post arrest   BRIEF PATIENT DESCRIPTION: 48 yo male with extensive PMH including OSA/OHS, NICM (EF25%), ESRD initially admitted 1/8 with with nec fasc of penile shaft and calciphylaxis v coumadin necrosis of BLE.  He was ultimately d/c to CIR 1/21.  He had cardiac arrest x 2 on 1/26 presumed r/t hyperkalemia and PCCM consulted for admit to ICU.   SIGNIFICANT EVENTS / STUDIES:  1/26 cardiac arrest r/t hyperkalemia, tx ICU   LINES / TUBES: ETT 1/26>>> R fem aline 1/26>>>  CULTURES: Urine 1/26>>> BCx2 1/26>>>   ANTIBIOTICS: vanc 1/26>>> Zosyn 1/26>>>  HISTORY OF PRESENT ILLNESS:   48 yo male with extensive PMH including OSA/OHS, NICM (EF25%), ESRD initially admitted 1/8 with with nec fasc of penile shaft and calciphylaxis v coumadin necrosis of BLE.  He was ultimately d/c to CIR 1/21.  He had cardiac arrest x 2 on 1/26 presumed r/t hyperkalemia and PCCM consulted for admit to ICU.    PAST MEDICAL HISTORY :  Past Medical History  Diagnosis Date  . Other and unspecified hyperlipidemia   . Insomnia, unspecified   . Obstructive sleep apnea (adult) (pediatric)   . Allergic rhinitis, cause unspecified   . Nonischemic cardiomyopathy     a. s/p St. Jude ICD 2011. b. Normal cors 2006. c. EF 25% in 12/2011.  Marland Kitchen Unspecified essential hypertension   . Type II or unspecified type diabetes mellitus without mention of complication, not stated as uncontrolled   . CKD (chronic kidney disease) stage 3, GFR 30-59 ml/min   . Esophageal reflux   . Morbid obesity   . Dysuria   . Tobacco use disorder   . Antral ulcer   . Renal azotemia   . Arthritis     Gout w/hyperuricemia  . Morbid obesity   . Automatic implantable cardioverter-defibrillator in situ   . Calciphylaxis   . PAF (paroxysmal atrial  fibrillation)     a. Dx 08/2012.  . Paroxysmal atrial flutter     a. Dx 08/2012.  . Paroxysmal VT     a. 08/2012 in setting of long QT, argatroban  . Chronic combined systolic and diastolic CHF (congestive heart failure)     a. Due to NICM.   Past Surgical History  Procedure Laterality Date  . Cardiac defibrillator placement  2011    St. Jude  . Esophagogastroduodenoscopy  01/03/2011    Procedure: ESOPHAGOGASTRODUODENOSCOPY (EGD);  Surgeon: Vertell Novak., MD;  Location: [facility] ENDOSCOPY;  Service: Endoscopy;  Laterality: N/A;  . Av fistula placement Right 08/31/2012    Procedure: ARTERIOVENOUS (AV) FISTULA CREATION;  Surgeon: Chuck Hint, MD;  Location: [facility] OR;  Service: Vascular;  Laterality: Right;  . Insertion of dialysis catheter Right 08/31/2012    Procedure: INSERTION OF DIALYSIS CATHETER-Right Internal Jugular Placement;  Surgeon: Chuck Hint, MD;  Location: [facility] OR;  Service: Vascular;  Laterality: Right;  . Circumcision N/A 02/04/2013    Procedure: CIRCUMCISION ADULT;  Surgeon: Kathi Ludwig, MD;  Location: [facility] OR;  Service: Urology;  Laterality: N/A;  . Irrigation and debridement abscess N/A 02/04/2013    Procedure: PENILE DEBRIDEMENT ;  Surgeon: Kathi Ludwig, MD;  Location: [facility] OR;  Service: Urology;  Laterality: N/A;  . Urethrotomy N/A 02/04/2013    Procedure: CYSTOSCOPY/URETHROTOMY;  Surgeon:  Kathi LudwigSigmund I Tannenbaum, MD;  [facility];  Service: Urology;  Laterality: N/A;  . Circumcision N/A 02/04/2013    Procedure: CIRCUMCISION ADULT;  Surgeon: Henrene DodgeMichael J Belsante, MD;  [facility];  Service: Urology;  Laterality: N/A;  . Groin debridement N/A 02/04/2013    Procedure: Penile DEBRIDEMENT;  Surgeon: Henrene DodgeMichael J Belsante, MD;  [facility];  Service: Urology;  Laterality: N/A;   Prior to Admission medications   Medication Sig Start Date End Date Taking? Authorizing Provider  acetaminophen (TYLENOL) 500 MG tablet Take 500 mg by mouth every 6 (six) hours as  needed.    Historical Provider, MD  amiodarone (PACERONE) 200 MG tablet Take 200 mg by mouth daily.    Historical Provider, MD  colchicine 0.6 MG tablet Take 1 tablet (0.6 mg total) by mouth See admin instructions. Takes every three days. 01/10/13 01/10/14  Judie BonusElizabeth A Kollar, MD  diclofenac sodium (VOLTAREN) 1 % GEL Apply 2 g topically 4 (four) times daily. 01/10/13   Judie BonusElizabeth A Kollar, MD  esomeprazole (NEXIUM) 20 MG capsule Take 20 mg by mouth daily at 12 noon. TAKE ONE CAPSULE EVERY DAY BEFORE BREAKFAST 01/10/13   Judie BonusElizabeth A Kollar, MD  Febuxostat (ULORIC) 80 MG TABS Take 1 tablet (80 mg total) by mouth daily. 12/30/12   Courtney ParisEden W Jones, MD  fluticasone (FLONASE) 50 MCG/ACT nasal spray Place 1 spray into both nostrils daily as needed for allergies. For allergies 01/25/13   Evelena PeatAlex Wilson, DO  HYDROcodone-acetaminophen [facility](NORCO) 7.5-325 MG per tablet Take 1-2 tablets by mouth every 4 (four) hours as needed for moderate pain or severe pain. 02/16/13   Windell Hummingbirdachel Chikowski, MD  Insulin Glargine (LANTUS SOLOSTAR) 100 UNIT/ML Solostar Pen Inject 28 Units into the skin daily at 10 pm. 02/16/13   Windell Hummingbirdachel Chikowski, MD  loratadine (CLARITIN) 10 MG tablet Take 10 mg by mouth daily as needed for allergies. 01/10/13   Judie BonusElizabeth A Kollar, MD  LORazepam (ATIVAN) 0.5 MG tablet Take 0.5 mg by mouth daily. TAKE 1 TABLET TWICE A DAY AS NEEDED 01/10/13   Judie BonusElizabeth A Kollar, MD  midodrine (PROAMATINE) 10 MG tablet Take 1 tablet (10 mg total) by mouth 3 (three) times daily with meals. 02/16/13   Windell Hummingbirdachel Chikowski, MD  potassium chloride SA (K-DUR,KLOR-CON) 20 MEQ tablet Take 1 tablet (20 mEq total) by mouth 2 (two) times daily. 02/16/13   Windell Hummingbirdachel Chikowski, MD  traMADol (ULTRAM) 50 MG tablet Take 100 mg by mouth 2 (two) times daily.    Historical Provider, MD   Allergies  Allergen Reactions  . Argatroban Other (See Comments)    Ventricular tachycardia  . Warfarin And Related Other (See Comments)    Skin necrosis  . Ace  Inhibitors     Acute renal failure with multiple trials  . Heparin     Thrombcytopenia. SRA positive. See miscellaneous test (SRA results)    FAMILY HISTORY:  Family History  Problem Relation Age of Onset  . Diabetes Mother   . Microcephaly Father   . Lung cancer Father    SOCIAL HISTORY:  reports that he has been smoking Cigarettes.  He has been smoking about 0.25 packs per day. He has never used smokeless tobacco. He reports that he does not drink alcohol or use illicit drugs.  REVIEW OF SYSTEMS:  Unable    VITAL SIGNS: Temp:  [96.9 F (36.1 C)-98.8 F (37.1 C)] 96.9 F (36.1 C) (01/26 0418) Pulse Rate:  [107-130] 130 (01/26 0836) Resp:  [17-18] 18 (01/26  0418) BP: (77-102)/(54-68) 102/68 mmHg (01/26 0836) SpO2:  [86 %-98 %] 92 % (01/26 0901) Weight:  [315 lb 14.7 oz (143.3 kg)] 315 lb 14.7 oz (143.3 kg) (01/26 0418) HEMODYNAMICS:   VENTILATOR SETTINGS:   INTAKE / OUTPUT: Intake/Output   None     PHYSICAL EXAMINATION: General:  Obese, chronically ill appearing male Neuro:  Obtunded  HEENT:  Mm dry, no JVD, ETT Cardiovascular:  Multiple asystole, intermittent VT Lungs:  ETT, vent, scattered rhonchi, bilat breath sounds post intubation  Abdomen:  Obese, soft,  Musculoskeletal:  Penis with dressing, multiple BLE ulcerations   LABS:  CBC  Recent Labs Lab 02/15/13 0420 02/16/13 0612 02/24/13 0550  WBC 12.5* 11.3* 8.8  HGB 8.6* 9.2* 9.0*  HCT 26.6* 28.9* 30.1*  PLT 355 351 306   Coag's  Recent Labs Lab 02/19/13 0523 02/20/13 0653 24-Feb-2013 0550  INR 2.85* 2.77* 2.94*   BMET  Recent Labs Lab 02/17/13 1530 02/18/13 0554 February 24, 2013 0550  NA 133* 140 134*  K 5.9* 4.3 5.0  CL 88* 96 91*  CO2 13* 23 23  BUN 34* 21 23  CREATININE 4.61* 3.54* 4.17*  GLUCOSE 122* 114* 147*   Electrolytes  Recent Labs Lab 02/16/13 0612 02/17/13 1530 02/18/13 0554 02-24-13 0550  CALCIUM 9.2 9.4 8.7 9.0  PHOS 3.7 5.3*  --  4.1   Sepsis Markers No results  found for this basename: LATICACIDVEN, PROCALCITON, O2SATVEN,  in the last 168 hours ABG  Recent Labs Lab February 24, 2013 1146  PHART 7.235*  PCO2ART 41.5  PO2ART 71.0*   Liver Enzymes  Recent Labs Lab 02/16/13 0612 02/17/13 1530 24-Feb-2013 0550  AST  --   --  41*  ALT  --   --  12  ALKPHOS  --   --  151*  BILITOT  --   --  4.0*  ALBUMIN 1.8* 2.1* 2.0*  2.0*   Cardiac Enzymes No results found for this basename: TROPONINI, PROBNP,  in the last 168 hours Glucose  Recent Labs Lab 02/19/13 2010 02/20/13 0714 02/20/13 1138 02/20/13 1558 02/20/13 2001 2013/02/24 0745  GLUCAP 122* 121* 143* 147* 154* 128*    Imaging Dg Chest Port 1 View  02/24/13   CLINICAL DATA:  Cough, decreased O2 sats  EXAM: PORTABLE CHEST - 1 VIEW  COMPARISON:  02/03/2013  FINDINGS: There is mild elevation of the right hemidiaphragm. Cardiac silhouette is enlarged, stable. A left chest wall cardiac pacing unit is appreciated with lead tip projecting in the region the right ventricle. No focal regions of consolidation or focal infiltrates are appreciated. The osseous structures are unremarkable.  IMPRESSION: Stable cardiomegaly without evidence of acute cardiopulmonary disease   Electronically Signed   By: Salome Holmes M.D.   On: 02-24-13 08:40      ASSESSMENT / PLAN:  PULMONARY Acute respiratory failure - post arrest  Hypoxemia  P:   Vent mgmt  F/u CXR  ABG stat   CARDIOVASCULAR Cardiac arrest - presumed r/t hyperkalemia - arrest x 3  NICM (EF 25%)  Chronic hypotension - on midodrine  P:  Correct hyperkalemia - see below  F/u BNP  F/u echo in am  Cardiology to see  Some ?DVT on CIR - consider BLE dopplers   RENAL ESRD  Hyperkalemia  P:   Emergent HD per renal  HCO3 gtt, insulin, calcium   GASTROINTESTINAL No active issue  P:   NPO TF in AM.  HEMATOLOGIC Coagulopathic - coumadin on hold r/t ?coumadin necrosis BLE and  need INR to trend down for tissue bx BLE  P:  F/u cbc    SCD's  F/u INR   INFECTIOUS Nec fasc penis  ?gangrene/ulcerations BLE - unclear etiology calciphylaxis v coumadin necrosis  P:   Tissue bx when INR improved  Pan culture  Empiric abx   ENDOCRINE DM  P:   SSI   NEUROLOGIC AMS - in setting cardiac arrest  P:   Supportive care  PRN sedation   WHITEHEART,KATHRYN, NP 02/14/2013  12:07 PM Pager: [phone] or [phone]  Arrest likely due to hyperkalemia (that part is in question however, will defer that to cards and renal), unsure of primary cardiac event.  Moved to ICU, intubated and coded again x3.  Will use pressors as needed for BP support and renal service to address dialysis need.  I have personally obtained a history, examined the patient, evaluated laboratory and imaging results, formulated the assessment and plan and placed orders.  CRITICAL CARE: The patient is critically ill with multiple organ systems failure and requires high complexity decision making for assessment and support, frequent evaluation and titration of therapies, application of advanced monitoring technologies and extensive interpretation of multiple databases. Critical Care Time devoted to patient care services described in this note is 90 minutes.   *Care during the described time interval was provided by me and/or other providers on the critical care team. I have reviewed this patient's available data, including medical history, events of note, physical examination and test results as part of my evaluation.  Alyson Reedy, M.D. [facility] Pulmonary/Critical Care Medicine. Pager: [phone]. After hours pager: [phone].

## 2013-02-27 NOTE — Progress Notes (Signed)
[place] PHYSICAL MEDICINE & REHABILITATION   Subjective/Complaints: bp low this am. HR increased as well. Pt denies complaints. Had HD last night. A 12 point review of systems has been performed and if not noted above is otherwise negative.   Objective: Vital Signs: Blood pressure 85/58, pulse 107, temperature 96.9 F (36.1 C), temperature source Oral, resp. rate 18, weight 143.3 kg (315 lb 14.7 oz), SpO2 98.00%. No results found.  Recent Labs  02/23/2013 0550  WBC 8.8  HGB 9.0*  HCT 30.1*  PLT 306    Recent Labs  02/04/2013 0550  NA 134*  K 5.0  CL 91*  GLUCOSE 147*  BUN 23  CREATININE 4.17*  CALCIUM 9.0   CBG (last 3)   Recent Labs  02/20/13 1558 02/20/13 2001 02/20/2013 0745  GLUCAP 147* 154* 128*    Wt Readings from Last 3 Encounters:  01/31/2013 143.3 kg (315 lb 14.7 oz)  02/16/13 139.3 kg (307 lb 1.6 oz)  02/16/13 139.3 kg (307 lb 1.6 oz)    Physical Exam:  Constitutional: He is oriented to person, place, and time.  Morbidly obese  HENT:  Head: Normocephalic.  Eyes: EOM are normal.  Neck: Normal range of motion. Neck supple. No thyromegaly present.  Cardiovascular: Regular rhythm and normal heart sounds.  Cardiac rate controlled  Respiratory: Effort normal and breath sounds normal.  GI: Soft. Bowel sounds are normal. He exhibits no distension.  obese  Musculoskeletal:  RLE 2++ edema, chronic stasis changes. LLE 1+ to 2+ with stasis changes as well.  Neurological: He is alert and oriented to person, place, and time.  UE's grossly 4+ to 5/5. LLE 2/5 HF, 2/5 KE, 3+ ADF and APF. RLE: 1+ HF, 2- KE, ADF and APF 3/5. Mild sensory changes in feet to LT. Reasonable insight and awareness. CN exam non-specific.  Skin:  Thigh wounds with eschar, somewhat dry, non-draining dressed with gauze, petroleum gauze, tender. Penile shaft clean with dressing.  Chronic stasis changes in the legs as noted above. Psychiatric:  Generally  appropriate  Assessment/Plan: 1. Functional deficits secondary to deconditioning due to necrotizing fasciitis of penis, multiple medical issues which require 3+ hours per day of interdisciplinary therapy in a comprehensive inpatient rehab setting. Physiatrist is providing close team supervision and 24 hour management of active medical problems listed below. Physiatrist and rehab team continue to assess barriers to discharge/monitor patient progress toward functional and medical goals. FIM: FIM - Bathing Bathing Steps Patient Completed: Chest;Right Arm;Left Arm;Abdomen;Front perineal area;Buttocks Bathing: 3: Mod-Patient completes 5-7 [id number] 10 parts or 50-74%  FIM - Upper Body Dressing/Undressing Upper body dressing/undressing steps patient completed: Thread/unthread right sleeve of pullover shirt/dresss;Thread/unthread left sleeve of pullover shirt/dress;Put head through opening of pull over shirt/dress;Pull shirt over trunk Upper body dressing/undressing: 0: Wears gown/pajamas-no public clothing FIM - Lower Body Dressing/Undressing Lower body dressing/undressing: 0: Wears Oceanographergown/pajamas-no public clothing     FIM - ArchivistToilet Transfers Toilet Transfers: 0-Activity did not occur  FIM - BankerBed/Chair Transfer Bed/Chair Transfer Assistive Devices: Environmental consultantWalker;Bed rails Bed/Chair Transfer: 3: Supine > Sit: Mod A (lifting assist/Pt. 50-74%/lift 2 legs  FIM - Locomotion: Wheelchair Locomotion: Wheelchair: 0: Activity did not occur FIM - Locomotion: Ambulation Locomotion: Ambulation Assistive Devices: Walker - Rolling Locomotion: Ambulation: 1: Travels less than 50 ft with minimal assistance (Pt.>75%)  Comprehension Comprehension Mode: Auditory Comprehension: 5-Understands complex 90% of the time/Cues < 10% of the time  Expression Expression Mode: Verbal Expression: 5-Expresses complex 90% of the time/cues < 10% of the time  Social Interaction Social Interaction: 6-Interacts appropriately with  others with medication or extra time (anti-anxiety, antidepressant).  Problem Solving Problem Solving: 4-Solves basic 75 - 89% of the time/requires cueing 10 - 24% of the time  Memory Memory: 6-More than reasonable amt of time  Medical Problem List and Plan:  1. Deconditioning related to necrotizing fasciitis of the penis, leg ulcers, multi-medical  2. DVT Prophylaxis/Anticoagulation: SCDs. Consider dopplers 3. Pain Management: Ultram 100 mg twice a day and hydrocodone as needed. Monitor with increased mobility  4. Neuropsych: This patient is capable of making decisions on his own behalf.  5. Atrial fibrillation/CV: oxygen sats lower, hypotensive, HR increased to 120-130  -Coumadin discontinued 02/15/2013 with question of Coumadin necrosis lower extremities.   -amiodarone increased. Fluid bolus yesterday  -check cxr today, limit physical activity to bedside this morning  -check ABG. Not sure how sensitive dopplers will be given edema 6. Necrotizing fasciitis of the penile glans and foreskin. Status post circumcision and debridement 02/04/2013.   -reviewed wound care with patient and RN, nephrology 7. Chronic lower extremity wounds. Suspect calciphylaxis versus Coumadin necrosis. Wound care as advised  -punch biopsy monday 8. Chronic hypotension. midodrine 10 mg 3 times a day. Monitor with increased activity  9. Diabetes mellitus with peripheral neuropathy. Check blood sugars a.c. and at bedtime. Lantus insulin 28 units each bedtime. 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 End-stage renal disease. HD per [facility].  12. Chronic anemia. Aranesp weekly  13. Anxiety/restlessness. Ativan 0.5 mg daily  14. Loose stool---improved. c diff neg. Imodium prn LOS (Days) 5 A FACE TO FACE EVALUATION WAS PERFORMED  SWARTZ,ZACHARY T 02/07/2013 8:06 AM

## 2013-02-27 NOTE — Procedures (Signed)
PEA / astole Treated hyeprK Also added high dos albuterol Bolus bicarb cpr acls followed  [name] [name], MD, FACP Pgr: [phone] [place] [facility]

## 2013-02-27 NOTE — Procedures (Signed)
Repeat Code PEA Again treated hyperk Renal on way Calcium, bicarb, high MV vent abg PEA 3 min  To svt  [name] [name], MD, FACP Pgr: [phone] [place] [facility]

## 2013-02-27 NOTE — Procedures (Signed)
Central Venous Catheter Insertion Procedure Note MERDITH LUCIBELLO [id number] 04/06/1965  Procedure: Insertion of Central Venous Catheter Indications: Assessment of intravascular volume, Drug and/or fluid administration and Frequent blood sampling  Procedure Details Consent: Unable to obtain consent because of emergent medical necessity. Time Out: Verified patient identification, verified procedure, site/side was marked, verified correct patient position, special equipment/implants available, medications/allergies/relevent history reviewed, required imaging and test results available.  Performed  Maximum sterile technique was used including antiseptics, gloves, gown, hand hygiene and mask. Skin prep: Chlorhexidine; local anesthetic administered A antimicrobial bonded/coated triple lumen catheter was placed in the left femoral vein due to emergent situation using the Seldinger technique.  Evaluation Blood flow good Complications: No apparent complications Patient did tolerate procedure well.  U/S used in placement.  Alyson Reedy, M.D. [facility] Pulmonary/Critical Care Medicine. Pager: [phone]. After hours pager: [phone].

## 2013-02-27 NOTE — Progress Notes (Signed)
Patient ID: Adam [name], male   DOB: [birth year], 48 y.o.   MRN: [id number] Re occuent arrest Last K imporved On 200 neo acls again done, resuscitated to wide complex with pulse No change in examination Family and med poa at bedside I have had extensive discussions with family. We discussed patients current circumstances and organ failures. We also discussed patient's prior wishes under circumstances such as this. Family has decided to NOT perform resuscitation if arrest but to continue current medical support for now. No escalation wished will keep neo, maintain high MV,  If arrest further no cpr Ccm time additional throughout day 60 min   [name] [name], MD, FACP Pgr: [phone] [place] [facility]

## 2013-02-27 NOTE — Discharge Summary (Signed)
Discharge summary job # [id number]

## 2013-02-27 NOTE — Progress Notes (Signed)
1345 pt absent of breath and hearts sounds, pupils fixed and dilated, family and MD agree to not persue aggressive measures at this time. DNR orders in place. Clinical assessment verified by Ardith Dark, RN.

## 2013-02-27 NOTE — Procedures (Signed)
Intubation Procedure Note Adam [name] [id number] 24-Jul-1965  Procedure: Intubation Indications: Respiratory insufficiency  Procedure Details Consent: Unable to obtain consent because of emergent medical necessity. Time Out: Verified patient identification, verified procedure, site/side was marked, verified correct patient position, special equipment/implants available, medications/allergies/relevent history reviewed, required imaging and test results available.  Performed  Maximum sterile technique was used including gloves, gown, hand hygiene and mask.  MAC   Evaluation Hemodynamic Status: BP stable throughout; O2 sats: transiently fell during during procedure Patient's Current Condition: unstable Complications: No apparent complications Patient did tolerate procedure well. Chest X-ray ordered to verify placement.  CXR: pending.   Code  [name] [name], MD, FACP Pgr: [phone] [place] [facility]

## 2013-02-27 NOTE — Progress Notes (Signed)
1035 patient sitting edge of bed participating in OT therapy . Patient alert and oriented x 4 reports pain in legs decreased after ultram 100 mg po given . Bilateral dressing changed to inner thighs while patient sitting edge of bed. Patient tolerated well. RN assisted patient by lifting bilateral legs in bed . Patient reached at top of bed at began pulling self up . Patient noted to be short of breath and head of bed raised . Patient reported feeling "ok" to continue with dressing changes to penis. Patient rolled on left side in bed and foam dressing changed to buttocks wound . Patient rolled to back and RN noted patient's eyes rolling back in head and unresponsive x approximately 20 seconds  . Attempted to sternal rub patient without response . Staff emergency call button pressed. Called for help outside door. Palpated for faint carotid pulse. Code blue called approximately 1045. Chest compressions began and code team arrived . Patient transferred to 2M05 at approximately 1125. Family notified by Harvel Ricks PA. Continue with plan of care .                              Cleotilde Neer

## 2013-02-27 NOTE — Code Documentation (Signed)
  Patient Name: Adam [name]   MRN: [id number]   Date of Birth/ Sex: April 29, 1965 , male      Admission Date: 02/25/2013  Attending Provider: Alyson Reedy, MD  Primary Diagnosis: <principal problem not specified>   Indication:  RN noted patient lost pulse and was then unresponsiveness. Code blue was subsequently called. At the time of arrival on scene, ACLS protocol was underway. Of note code was performed once on 4W, then transferred to ICU with subsequent code again.    Technical Description:  - CPR performance duration:  2 minutes on 1st code, approximately 1 minute on 2nd code  - Was defibrillation or cardioversion used? No   - Was external pacer placed? No  - Was patient intubated pre/post CPR? Yes   Medications Administered: Y = Yes; Blank = No Amiodarone    Atropine    Calcium  Yes  Epinephrine  Yes  Lidocaine    Magnesium    Norepinephrine    Phenylephrine    Sodium bicarbonate  Yes  Vasopressin    D50 and insulin              Yes  Post CPR evaluation:  - Final Status - Was patient successfully resuscitated ? Yes - What is current rhythm? Atrial fibrillation  - What is current hemodynamic status? Hypotensive, tachycardic   Miscellaneous Information:  - Labs sent, including: i-stat chem 8   - Primary team notified?  Yes  - Family Notified? No  - Additional notes/ transfer status:  Code blue team Dr. Virgina Organ at bedside. PCCM then notified by bedside, after which pt moved to ICU where PCCM resumed care.      Otis Brace, MD  02/15/2013, 11:34 AM

## 2013-02-27 NOTE — Progress Notes (Signed)
Subjective:  Objective: Vital signs in last 24 hours: Temp:  [96.9 F (36.1 C)-98.8 F (37.1 C)] 96.9 F (36.1 C) (01/26 0418) Pulse Rate:  [107-130] 130 (01/26 0836) Resp:  [17-18] 18 (01/26 0418) BP: (77-102)/(54-68) 102/68 mmHg (01/26 0836) SpO2:  [86 %-98 %] 92 % (01/26 0901) Weight:  [315 lb 14.7 oz (143.3 kg)] 315 lb 14.7 oz (143.3 kg) (01/26 0418) Last BM Date: 02/19/13  Intake/Output from previous day: 01/25 0701 - 01/26 0700 In: 1560 [P.O.:1560] Out: -  Intake/Output this shift: Total I/O In: 240 [P.O.:240] Out: -   Medications Current Facility-Administered Medications  Medication Dose Route Frequency Provider Last Rate Last Dose  . acetaminophen (TYLENOL) tablet 500 mg  500 mg Oral Q6H PRN Mcarthur Rossettianiel J Angiulli, PA-C      . amiodarone (PACERONE) tablet 400 mg  400 mg Oral BID Antoine PocheJonathan F Branch, MD   400 mg at 02/07/2013 [id number]  . colchicine tablet 0.6 mg  0.6 mg Oral Q3 days Ranelle OysterZachary T Swartz, MD   0.6 mg at 02/20/13 0815  . darbepoetin (ARANESP) injection 200 mcg  200 mcg Intravenous Q Thu-HD Daniel J Angiulli, PA-C   200 mcg at 02/17/13 1630  . diclofenac sodium (VOLTAREN) 1 % transdermal gel 2 g  2 g Topical QID Mcarthur Rossettianiel J Angiulli, PA-C   2 g at 02/12/2013 [id number]  . febuxostat (ULORIC) tablet 80 mg  80 mg Oral Daily Mcarthur RossettiDaniel J Angiulli, PA-C   80 mg at 02/10/2013 0941  . feeding supplement (PRO-STAT SUGAR FREE 64) liquid 30 mL  30 mL Oral BID WC Daniel J Angiulli, PA-C   30 mL at 02/17/2013 0942  . feeding supplement (RESOURCE BREEZE) (RESOURCE BREEZE) liquid 1 Container  1 Container Oral BID BM Mcarthur Rossettianiel J Angiulli, PA-C   1 Container at 02/24/2013 [phone]  . fluticasone (FLONASE) 50 MCG/ACT nasal spray 1 spray  1 spray Each Nare Daily PRN Mcarthur Rossettianiel J Angiulli, PA-C      . HYDROcodone-acetaminophen (NORCO) 7.5-325 MG per tablet 1-2 tablet  1-2 tablet Oral Q4H PRN Charlton Amoraniel J Angiulli, PA-C   1 tablet at 02/18/13 2145  . insulin aspart (novoLOG) injection 0-9 Units  0-9 Units Subcutaneous TID  WC Mcarthur Rossettianiel J Angiulli, PA-C   1 Units at 02/02/2013 [phone]  . insulin glargine (LANTUS) injection 28 Units  28 Units Subcutaneous QHS Charlton AmorDaniel J Angiulli, PA-C   28 Units at 02/20/13 2136  . loperamide (IMODIUM) capsule 2 mg  2 mg Oral Q6H PRN Ranelle OysterZachary T Swartz, MD      . loratadine (CLARITIN) tablet 10 mg  10 mg Oral Daily PRN Mcarthur Rossettianiel J Angiulli, PA-C      . LORazepam (ATIVAN) tablet 0.5 mg  0.5 mg Oral Daily Mcarthur RossettiDaniel J Angiulli, PA-C   0.5 mg at 02/05/2013 [id number]  . midodrine (PROAMATINE) tablet 10 mg  10 mg Oral TID WC Daniel J Angiulli, PA-C   10 mg at 01/27/2013 0940  . multivitamin (RENA-VIT) tablet 1 tablet  1 tablet Oral QHS Mcarthur Rossettianiel J Angiulli, PA-C   1 tablet at 02/20/13 2134  . ondansetron (ZOFRAN) tablet 4 mg  4 mg Oral Q6H PRN Mcarthur Rossettianiel J Angiulli, PA-C   4 mg at 02/19/13 2118   Or  . ondansetron [facility](ZOFRAN) injection 4 mg  4 mg Intravenous Q6H PRN Mcarthur Rossettianiel J Angiulli, PA-C      . pantoprazole (PROTONIX) EC tablet 40 mg  40 mg Oral QAC breakfast Mcarthur RossettiDaniel J Angiulli, PA-C   40 mg at 02/18/2013 0630  .  potassium chloride SA (K-DUR,KLOR-CON) CR tablet 20 mEq  20 mEq Oral BID Mcarthur Rossetti Angiulli, PA-C   20 mEq at 03-06-13 0940  . saccharomyces boulardii (FLORASTOR) capsule 250 mg  250 mg Oral BID Ranelle Oyster, MD   250 mg at 2013/03/06 0941  . sevelamer carbonate (RENVELA) tablet 1,600 mg  1,600 mg Oral TID WC Kerin Salen, PA-C   1,600 mg at Mar 06, 2013 0941  . sorbitol 70 % solution 30 mL  30 mL Oral Daily PRN Mcarthur Rossetti Angiulli, PA-C      . traMADol Janean Sark) tablet 100 mg  100 mg Oral BID Mcarthur Rossetti Angiulli, PA-C   100 mg at 2013-03-06 1696  . white petrolatum (VASELINE) gel   Topical PRN Mcarthur Rossetti Angiulli, PA-C   0.2 application at 02/20/13 7893    PE:   Lab Results:   Recent Labs  2013-03-06 0550  WBC 8.8  HGB 9.0*  HCT 30.1*  PLT 306   BMET  Recent Labs  06-Mar-2013 0550  NA 134*  K 5.0  CL 91*  CO2 23  GLUCOSE 147*  BUN 23  CREATININE 4.17*  CALCIUM 9.0   PT/INR  Recent Labs  02/19/13 0523  02/20/13 0653 03-06-2013 0550  LABPROT 28.9* 28.3* 29.6*  INR 2.85* 2.77* 2.94*   Cholesterol No results found for this basename: CHOL,  in the last 72 hours Cardiac Enzymes No components found with this basename: TROPONIN,  CKMB,   Studies/Results: @RISRSLT2 @   Assessment/Plan   Active Problems:   Type II or unspecified type diabetes mellitus with renal manifestations, uncontrolled   CARDIOMYOPATHY   Chronic systolic heart failure   ICD (implantable cardioverter-defibrillator), single, in situ; [employer]   End stage renal disease   Atrial fibrillation   Physical deconditioning   Plan:  Code blue called prior to seeing the patient.   LOS: 5 days    HAGER, BRYAN 03-06-13 10:49 AM  I did not have a chance to exam this patient today before his Code Blue was called. Pt with asystolic arrest. K over 8.0. CCM team assisted in care of patient. Pt expired in the MICU this afternoon.   MCALHANY,CHRISTOPHER 6:04 PM 03-06-2013

## 2013-02-27 NOTE — Progress Notes (Signed)
Occupational Therapy Note  Patient Details  Name: Adam [name] MRN: [id number] Date of Birth: 14-Nov-1965 Today's Date: 02/18/2013  Pt missed 45 mins skilled OT services secondary to nursing care/wound care.  Pt became tearful; emotional support provided.   Lavone Neri [facility] 01/27/2013, 11:01 AM

## 2013-02-27 NOTE — Progress Notes (Signed)
[facility] Progress Note   Subjective: Hypoxemic, BP's dropped overnight, got fluid bolus  Filed Vitals:   02/01/2013 0003 02/01/2013 0418 02/09/2013 0836 02/08/2013 0901  BP: 83/62 85/58 102/68   Pulse: 130 107 130   Temp: 97.3 F (36.3 C) 96.9 F (36.1 C)    TempSrc: Oral Oral    Resp: 17 18    Weight:  143.3 kg (315 lb 14.7 oz)    SpO2: 92% 98% 86% 92%  General: clammy, looks worse today, awake and responding, ?icteric Heart: Tachy irreg  Lungs: Grossly clear  Abdomen: Obese, NT, +BS  Extremities: LE's dark/woody/tight. Ulcerations to bilat thighs/calves wrapped. +bilat LE woody edema. Access: R AVF + bruit  [facility]  4.75h F180 Bath 2K/2.25Ca DW 143.5kg RFA AVF No heparin  EPO 22K Hect 6ug No Fe at center  Last labs:  PTH 157, tsat 13%, ferritin 62, Hb 8.9  Assess/Plan 1. 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 Tissue biopsy when INR down. Cont Na thio, extra HD 4d/week now, sensipar, holding coumadin. Prognosis guarded.  2. Hypoxemia- cxr clear, undergoing workup this am, ABG pend 3. [name]- going up off coumadin, check LFT"s 4. Afib/RVR 5. NICM EF 25% / AICD / bivent HF 6. Pacemaker 7. Deconditioning 8. Chronic hypotension / volume- close to dry wt, BP's low on midodrine. Pt's unstable today with hypoxemia, symptomatic low BP. Hold off on HD today, plan HD tomorrow.   9. Anemia / CKD- Hb 9.2, on max darbe 200/wk. tsat 17%, ferr 345, holding IV Fe pending biopsy   Vinson Moselle MD pager [phone]    cell [phone] 02/05/2013, 9:56 AM   Recent Labs Lab 02/16/13 0612 02/17/13 1530 02/18/13 0554 02/13/2013 0550  NA 137 133* 140 134*  K 5.1 5.9* 4.3 5.0  CL 92* 88* 96 91*  CO2 20 13* 23 23  GLUCOSE 146* 122* 114* 147*  BUN 20 34* 21 23  CREATININE 3.02* 4.61* 3.54* 4.17*  CALCIUM 9.2 9.4 8.7 9.0  PHOS 3.7 5.3*  --  4.1    Recent Labs Lab 02/16/13 0612 02/17/13 1530 02/16/2013 0550   ALBUMIN 1.8* 2.1* 2.0*    Recent Labs Lab 02/15/13 0420 02/16/13 0612 02/09/2013 0550  WBC 12.5* 11.3* 8.8  HGB 8.6* 9.2* 9.0*  HCT 26.6* 28.9* 30.1*  MCV 88.7 91.5 95.9  PLT 355 351 306   . amiodarone  400 mg Oral BID  . colchicine  0.6 mg Oral Q3 days  . darbepoetin (ARANESP) injection - DIALYSIS  200 mcg Intravenous Q Thu-HD  . diclofenac sodium  2 g Topical QID  . febuxostat  80 mg Oral Daily  . feeding supplement (PRO-STAT SUGAR FREE 64)  30 mL Oral BID WC  . feeding supplement (RESOURCE BREEZE)  1 Container Oral BID BM  . insulin aspart  0-9 Units Subcutaneous TID WC  . insulin glargine  28 Units Subcutaneous QHS  . LORazepam  0.5 mg Oral Daily  . midodrine  10 mg Oral TID WC  . multivitamin  1 tablet Oral QHS  . pantoprazole  40 mg Oral QAC breakfast  . potassium chloride  20 mEq Oral BID  . saccharomyces boulardii  250 mg Oral BID  . sevelamer carbonate  1,600 mg Oral TID WC  . traMADol  100 mg Oral BID     acetaminophen, fluticasone, HYDROcodone-acetaminophen, loperamide, loratadine, ondansetron (ZOFRAN) IV, ondansetron, sorbitol, white petrolatum

## 2013-02-27 NOTE — Procedures (Signed)
ACLS Code Pea, presumed hyperkalemia  3 min  Treated acls, treated hyperK Back to SVT  [name] [name], MD, FACP Pgr: [phone] [facility]

## 2013-02-27 DEATH — deceased

## 2013-02-28 LAB — POCT I-STAT, CHEM 8
BUN: 37 mg/dL — AB (ref 6–23)
Calcium, Ion: 0.8 mmol/L — ABNORMAL LOW (ref 1.12–1.23)
Chloride: 102 mEq/L (ref 96–112)
Creatinine, Ser: 4.9 mg/dL — ABNORMAL HIGH (ref 0.50–1.35)
Glucose, Bld: 142 mg/dL — ABNORMAL HIGH (ref 70–99)
HCT: 37 % — ABNORMAL LOW (ref 39.0–52.0)
Hemoglobin: 12.6 g/dL — ABNORMAL LOW (ref 13.0–17.0)
POTASSIUM: 7.8 meq/L — AB (ref 3.7–5.3)
SODIUM: 128 meq/L — AB (ref 137–147)
TCO2: 18 mmol/L (ref 0–100)

## 2013-03-02 ENCOUNTER — Encounter: Payer: Medicaid Other | Admitting: Internal Medicine

## 2013-03-09 NOTE — Discharge Summary (Signed)
NAMEKATHAN, Adam [name]                  ACCOUNT NO.:  [id number]  MEDICAL RECORD NO.:  [id number]  LOCATION:  2M05C                        FACILITY:  [facility]  PHYSICIAN:  Felipa Evener, MD  DATE OF BIRTH:  16-May-1965  DATE OF ADMISSION:  03-16-2013 DATE OF DISCHARGE:  Mar 16, 2013                              DISCHARGE SUMMARY   DEATH SUMMARY  PRIMARY DIAGNOSIS/CAUSE OF DEATH:  Pulseless electrical activity, cardiac arrest.  SECONDARY DIAGNOSES:  Respiratory failure, end-stage renal disease, nonischemic cardiomyopathy, hyperkalemia, severe acidosis, and hypotension.  HOSPITAL COURSE:  The patient is a 48 year old male with past medical history significant for nonischemic cardiomyopathy __________ with ejection fraction of 25%, on midodrine for hypotension, who was in rehab, where he had an episode of cardiac arrest, pulseless electrical activity with hyperkalemia, CPR was performed.  The patient brought down to the intensive care unit.  In the intensive care unit, the patient continued to have further episodes of cardiac arrest in spite of maximal support.  Dr. __________ had discussion with the family.  We discussed current condition and after discussion, the family decided not to perform further resuscitation if arrest is to happen.  Following that, the patient had another repeat episode of cardiac arrest, at which point, he was declared dead and the family was informed.     Felipa Evener, MD     WJY/MEDQ  D:  03/08/2013  T:  03/09/2013  Job:  [id number]

## 2013-10-31 IMAGING — CR DG CHEST 2V
2 series · 2 of 2 positions shown · non-contrast
Comparison: 10/28/2011

CLINICAL DATA: Preop for dialysis catheter insertion

CHEST - 2 VIEW

[w chest pa]
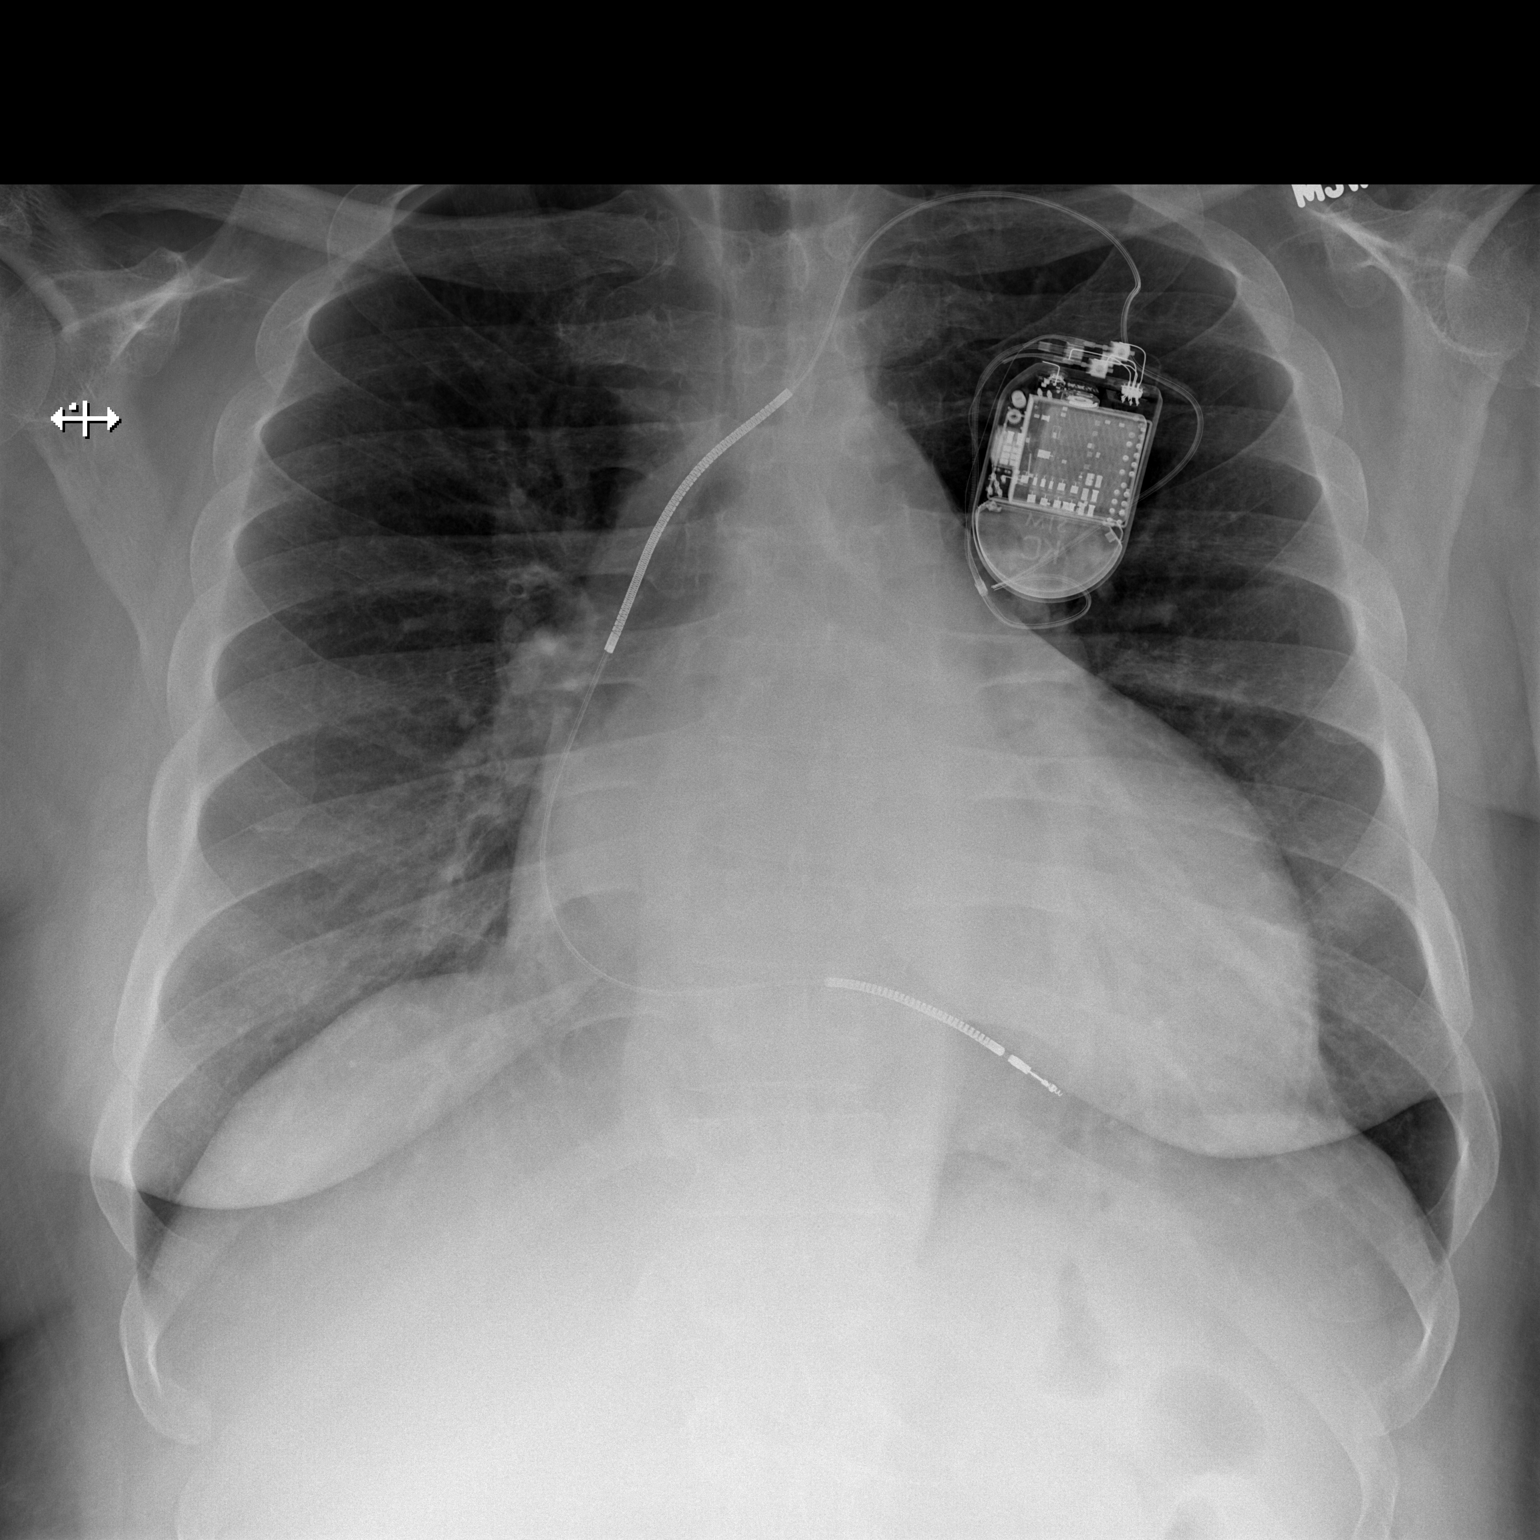

[w chest lat]
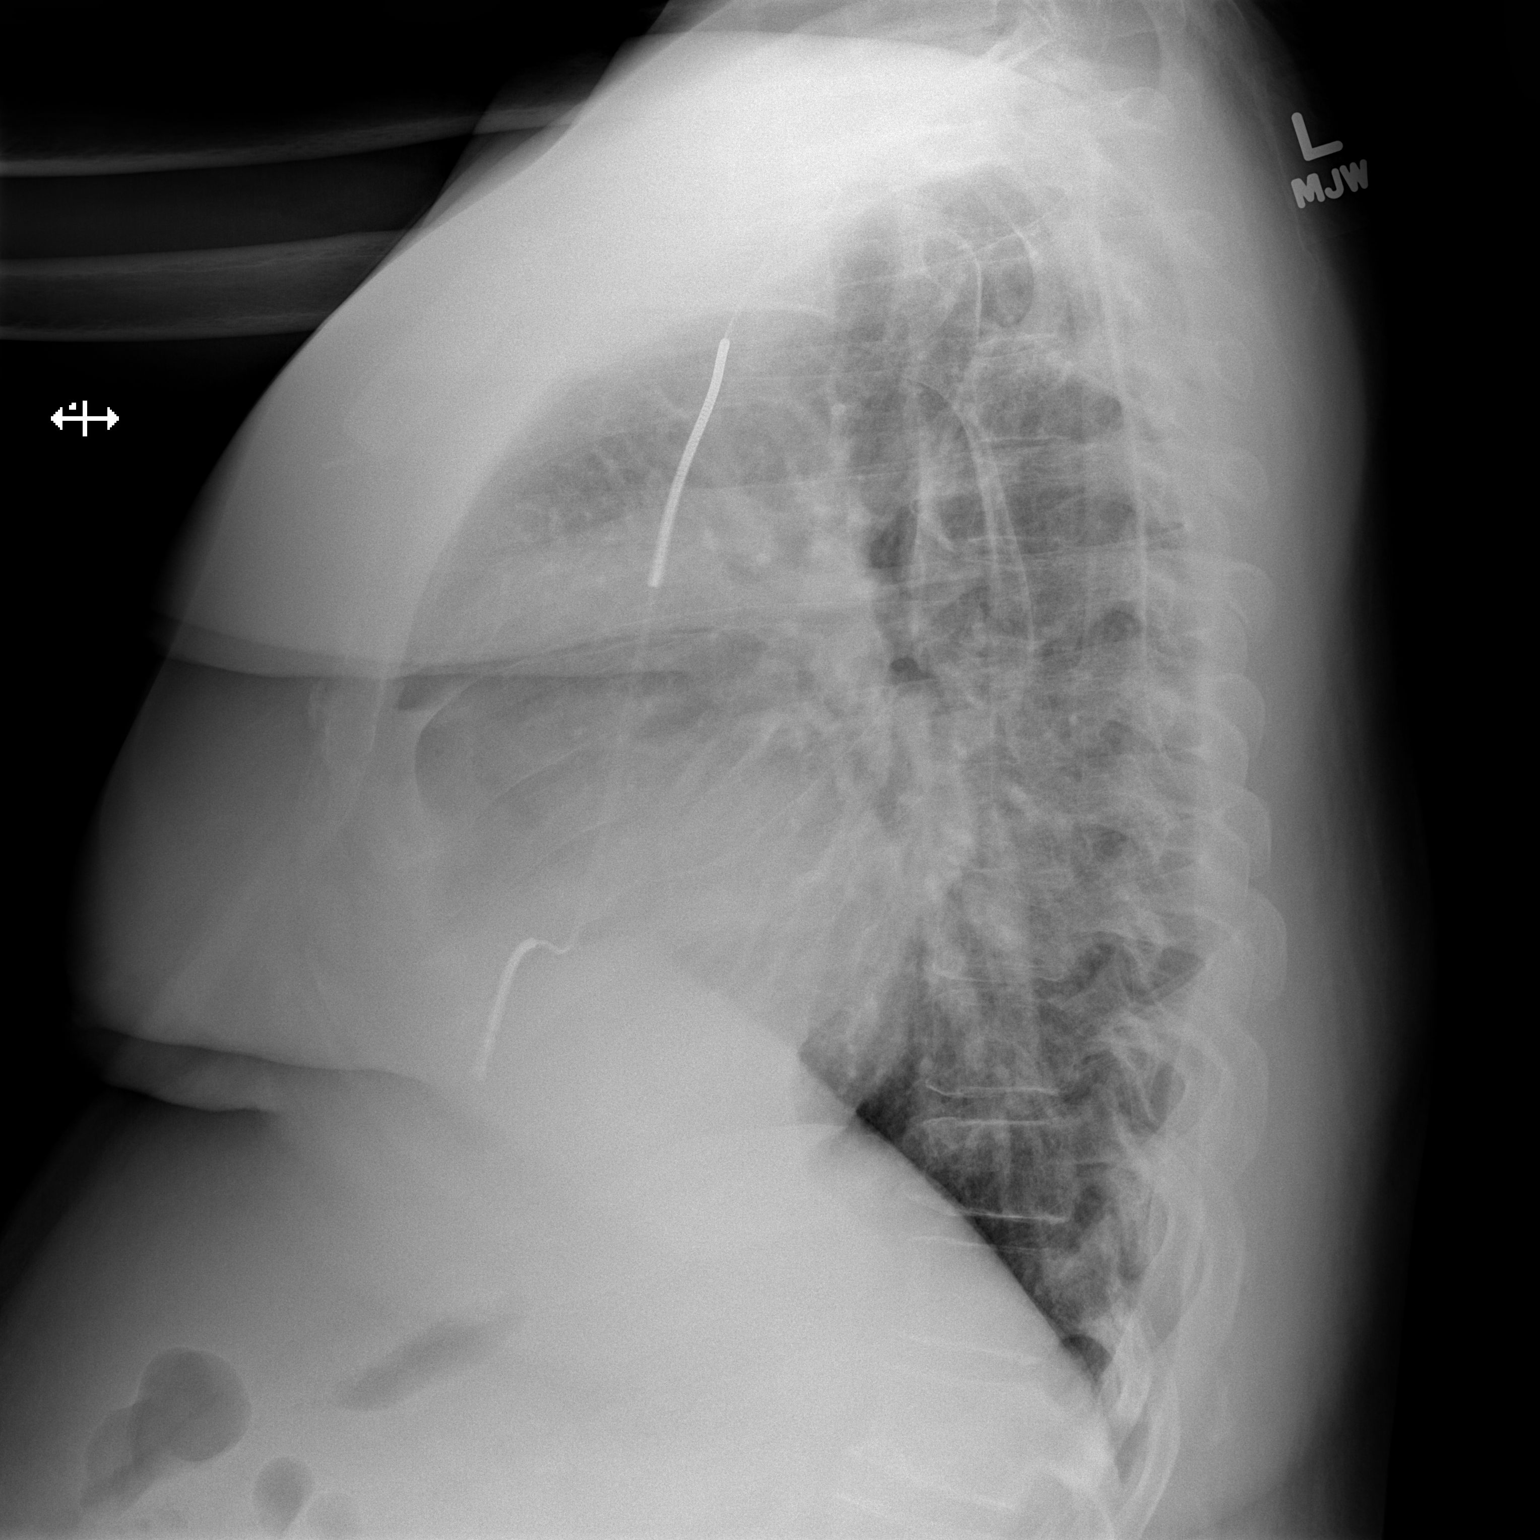

[2 of 2 positions shown; findings below may reference images not displayed]

FINDINGS: Cardiomegaly again noted.  Single lead cardiac pacemaker
is unchanged in position.  No acute infiltrate or pleural effusion.
No pulmonary edema.  Bony thorax is stable.
IMPRESSION: No active disease.  Cardiomegaly again noted.  Single lead cardiac
pacemaker in place.

## 2013-11-05 IMAGING — CR DG CHEST 1V PORT
1 series · 1 of 1 positions shown · non-contrast
Comparison: Prior chest x-ray 08/26/2012

CLINICAL DATA: Status post hemodialysis catheter placement

PORTABLE CHEST - 1 VIEW

[AP]
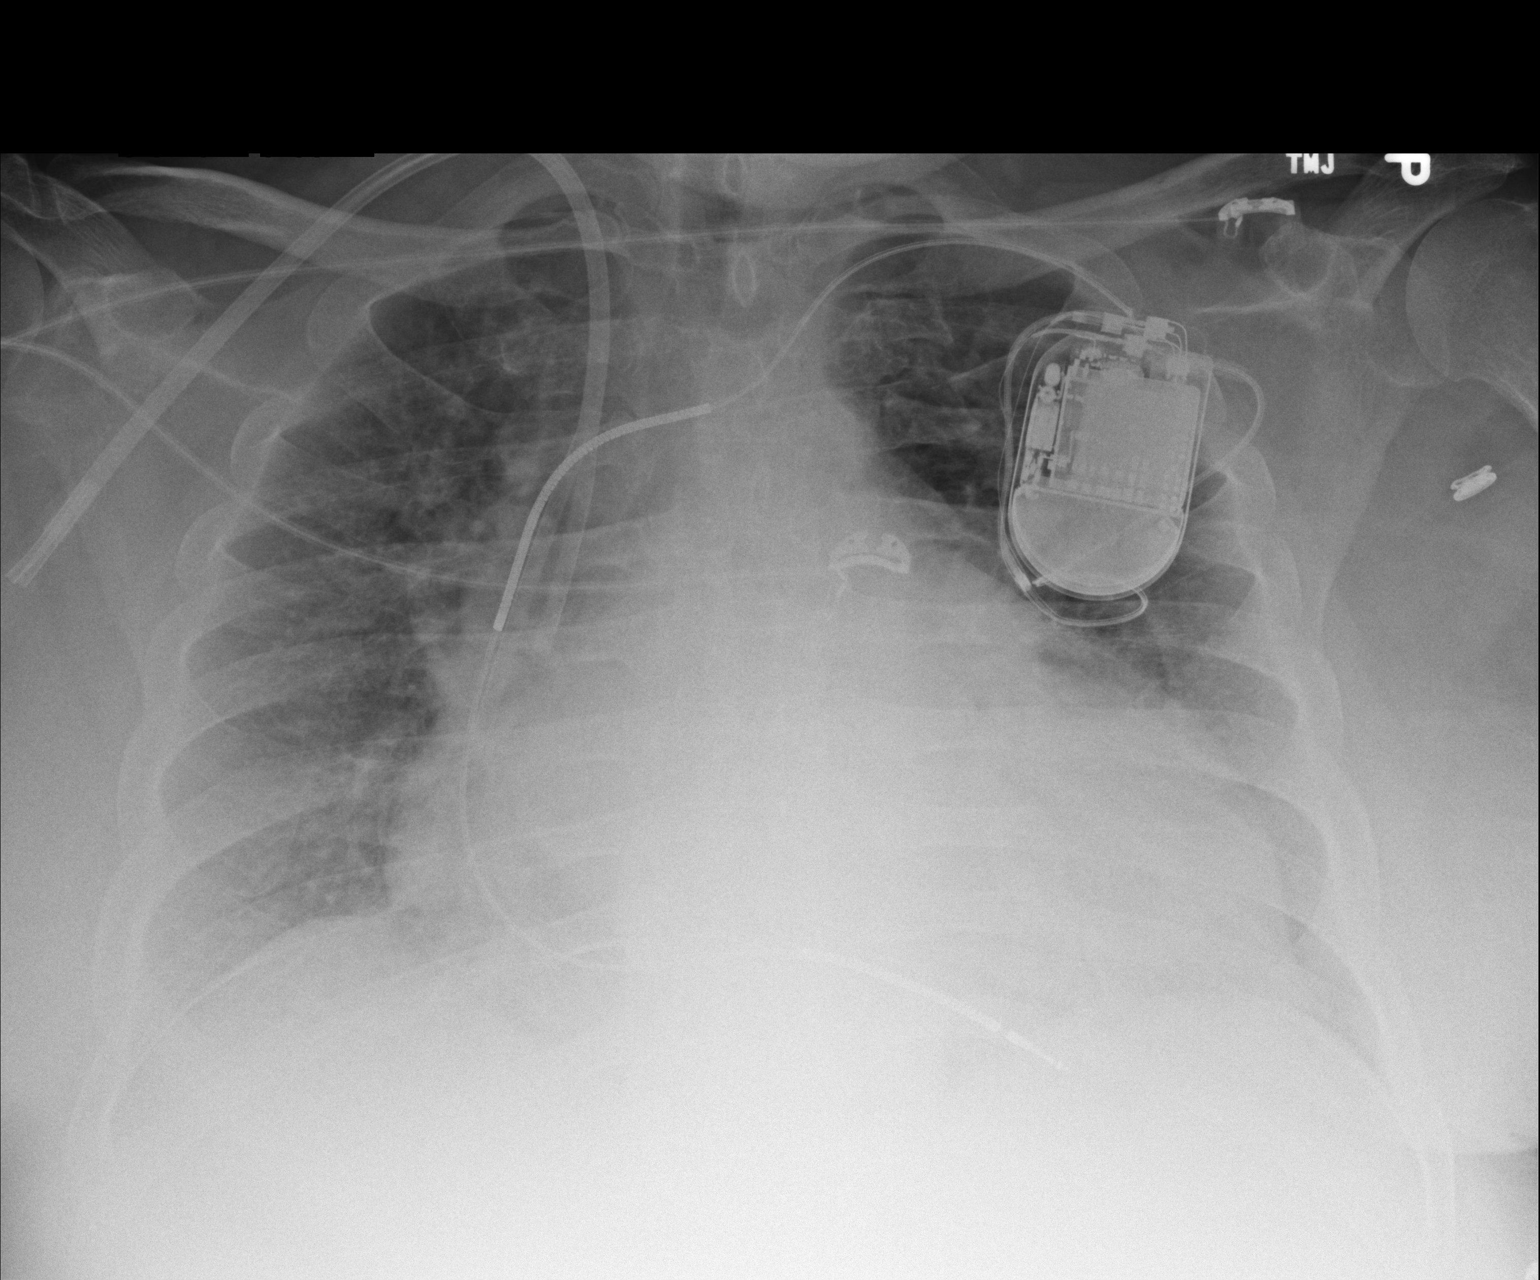

[1 of 1 positions shown; findings below may reference images not displayed]

FINDINGS: Interval placement of a right IJ approach tunneled
hemodialysis catheter.  The catheter tips project over the superior
cavoatrial junction.  No evidence of pneumothorax.  Enlargement the
cardiopericardial silhouette with pulmonary vascular congestion and
mild interstitial edema.  Stable left subclavian approach cardiac
rhythm maintenance device with the single lead projecting over the
right ventricle.  No acute osseous abnormality.
IMPRESSION: 1.  Right IJ approach tunneled hemodialysis catheter with the tips
projecting over the superior cavoatrial junction.
2.  Cardiomegaly with mild CHF versus volume overload.

## 2013-11-15 IMAGING — CR DG CHEST 1V PORT
1 series · 1 of 1 positions shown · non-contrast
Comparison: 08/31/2012.

CLINICAL DATA: Lower extremity swelling.  Weakness and dizziness.

PORTABLE CHEST - 1 VIEW

[AP]
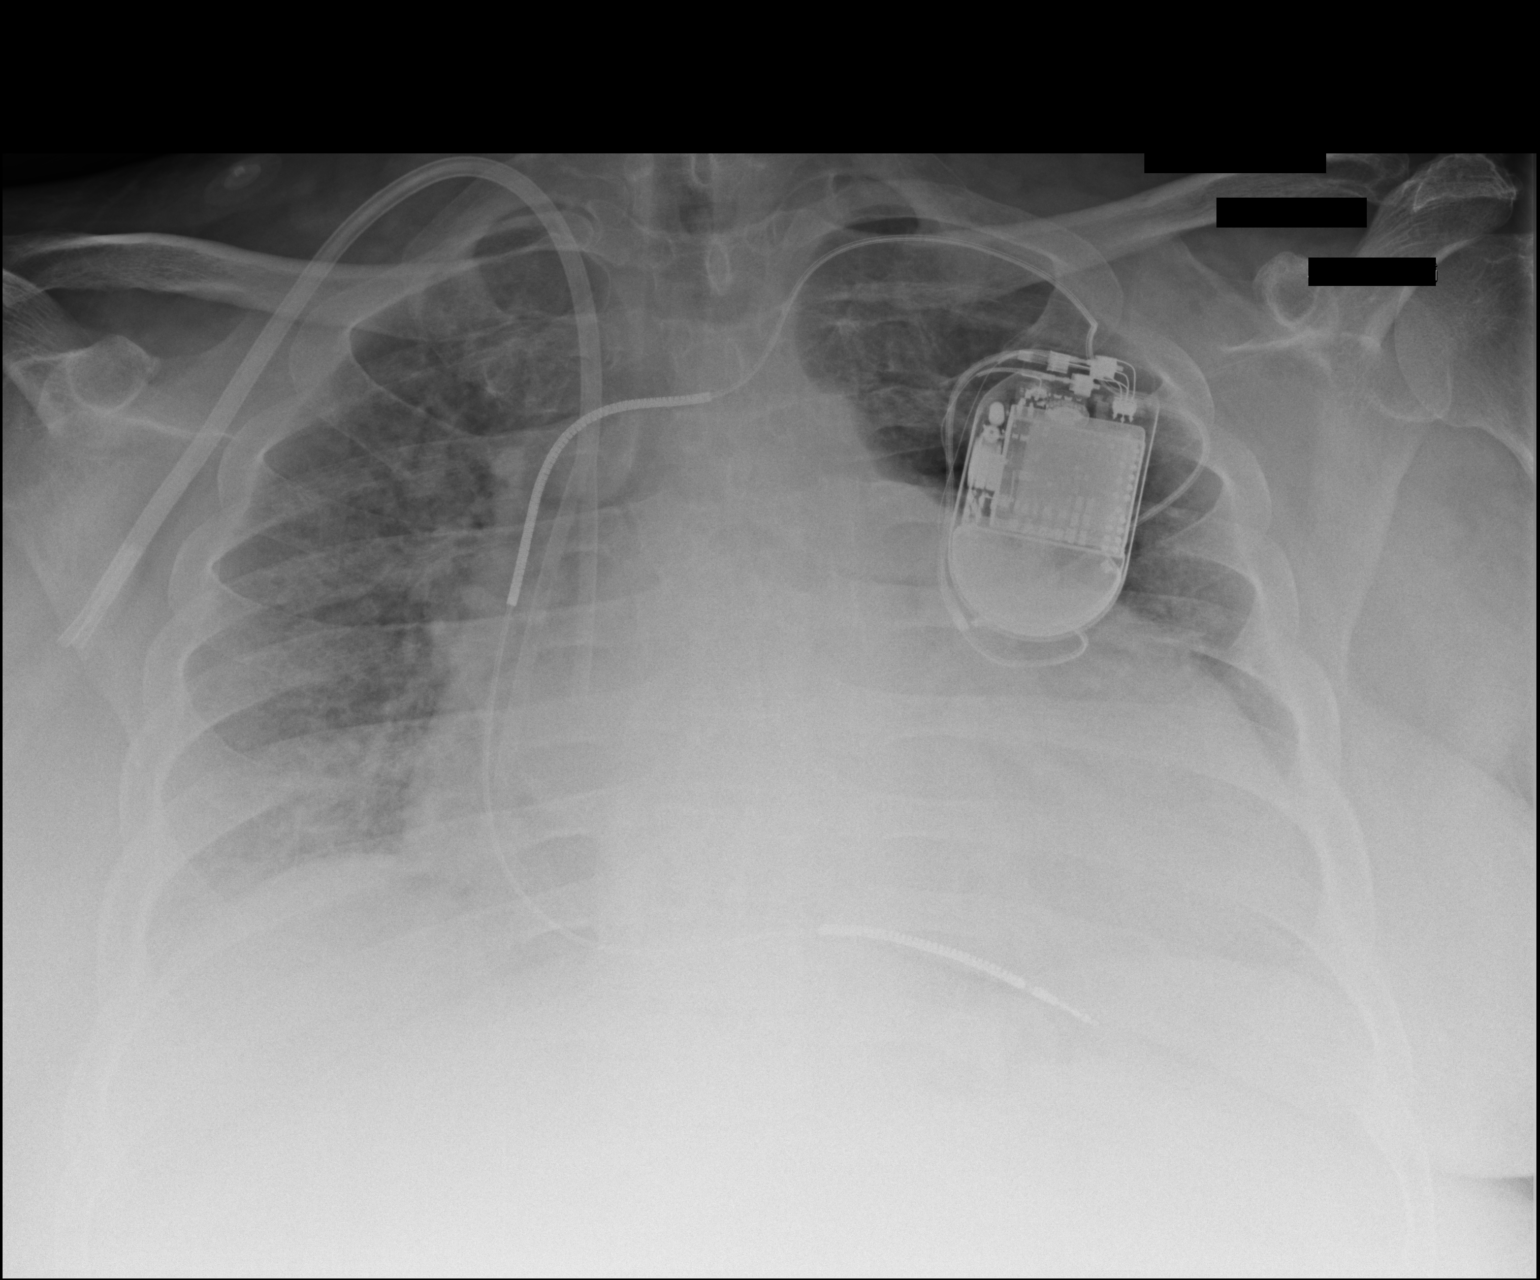

[1 of 1 positions shown; findings below may reference images not displayed]

FINDINGS: The pacer wire / AICD is stable.  The dialysis catheter
is unchanged.  The heart is enlarged and there is vascular
congestion and pulmonary edema.  No definite pleural effusion.
IMPRESSION: Cardiac enlargement and pulmonary edema suggesting CHF.  No
definite pleural effusion.

## 2014-04-28 IMAGING — CR DG CHEST 1V PORT
1 series · 1 of 1 positions shown · non-contrast
Comparison: 02/03/2013

CLINICAL DATA: Cough, decreased O2 sats

EXAM:
PORTABLE CHEST - 1 VIEW

[AP]
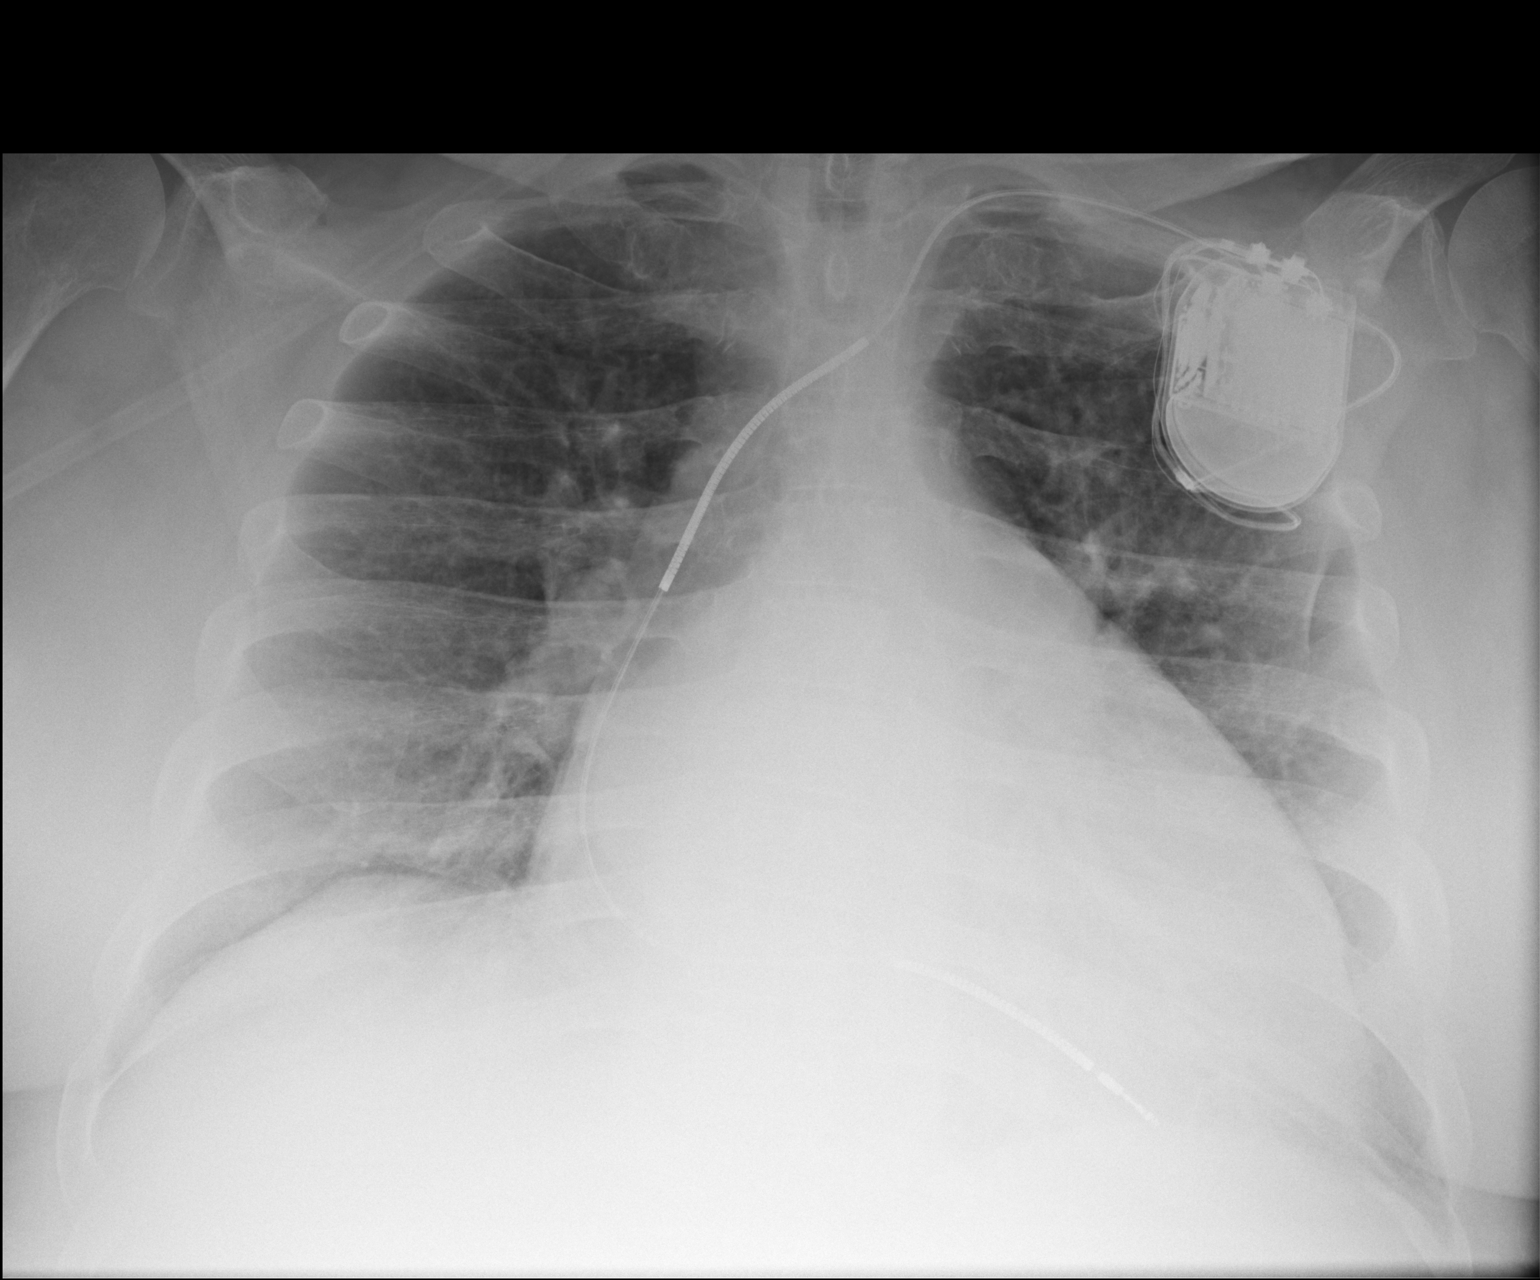

[1 of 1 positions shown; findings below may reference images not displayed]

FINDINGS: There is mild elevation of the right hemidiaphragm. Cardiac
silhouette is enlarged, stable. A left chest wall cardiac pacing
unit is appreciated with lead tip projecting in the region the right
ventricle. No focal regions of consolidation or focal infiltrates
are appreciated. The osseous structures are unremarkable.
IMPRESSION: Stable cardiomegaly without evidence of acute cardiopulmonary
disease

## 2023-01-19 NOTE — Telephone Encounter (Signed)
error
